# Patient Record
Sex: Female | Born: 1960 | Race: White | Hispanic: No | Marital: Married | Smoking: Never smoker
Health system: Southern US, Community
[De-identification: ages and names within clinical notes are randomized; demographics above are authoritative.]

## PROBLEM LIST (undated history)

## (undated) DIAGNOSIS — D509 Iron deficiency anemia, unspecified: Secondary | ICD-10-CM

## (undated) DIAGNOSIS — M1990 Unspecified osteoarthritis, unspecified site: Secondary | ICD-10-CM

## (undated) DIAGNOSIS — M797 Fibromyalgia: Secondary | ICD-10-CM

## (undated) DIAGNOSIS — F32A Depression, unspecified: Secondary | ICD-10-CM

## (undated) DIAGNOSIS — R7303 Prediabetes: Secondary | ICD-10-CM

## (undated) DIAGNOSIS — T7840XA Allergy, unspecified, initial encounter: Secondary | ICD-10-CM

## (undated) DIAGNOSIS — M21611 Bunion of right foot: Secondary | ICD-10-CM

## (undated) DIAGNOSIS — C439 Malignant melanoma of skin, unspecified: Secondary | ICD-10-CM

## (undated) DIAGNOSIS — I10 Essential (primary) hypertension: Secondary | ICD-10-CM

## (undated) DIAGNOSIS — K5792 Diverticulitis of intestine, part unspecified, without perforation or abscess without bleeding: Secondary | ICD-10-CM

## (undated) DIAGNOSIS — K219 Gastro-esophageal reflux disease without esophagitis: Secondary | ICD-10-CM

## (undated) DIAGNOSIS — E039 Hypothyroidism, unspecified: Secondary | ICD-10-CM

## (undated) DIAGNOSIS — D649 Anemia, unspecified: Secondary | ICD-10-CM

## (undated) DIAGNOSIS — Z8719 Personal history of other diseases of the digestive system: Secondary | ICD-10-CM

## (undated) DIAGNOSIS — R011 Cardiac murmur, unspecified: Secondary | ICD-10-CM

## (undated) DIAGNOSIS — F419 Anxiety disorder, unspecified: Secondary | ICD-10-CM

## (undated) DIAGNOSIS — E079 Disorder of thyroid, unspecified: Secondary | ICD-10-CM

## (undated) DIAGNOSIS — E785 Hyperlipidemia, unspecified: Secondary | ICD-10-CM

## (undated) HISTORY — DX: Allergy, unspecified, initial encounter: T78.40XA

## (undated) HISTORY — PX: JOINT REPLACEMENT: SHX530

## (undated) HISTORY — DX: Malignant melanoma of skin, unspecified: C43.9

## (undated) HISTORY — PX: TUBAL LIGATION: SHX77

## (undated) HISTORY — PX: HERNIA REPAIR: SHX51

## (undated) HISTORY — PX: CHOLECYSTECTOMY: SHX55

## (undated) HISTORY — DX: Unspecified osteoarthritis, unspecified site: M19.90

## (undated) HISTORY — DX: Hyperlipidemia, unspecified: E78.5

## (undated) HISTORY — DX: Disorder of thyroid, unspecified: E07.9

## (undated) HISTORY — PX: COLONOSCOPY: SHX174

---

## 1983-06-27 HISTORY — PX: ABDOMINAL HYSTERECTOMY: SHX81

## 2001-02-18 ENCOUNTER — Other Ambulatory Visit: Admission: RE | Admit: 2001-02-18 | Discharge: 2001-02-18 | Payer: Self-pay | Admitting: Gynecology

## 2002-01-27 ENCOUNTER — Other Ambulatory Visit: Admission: RE | Admit: 2002-01-27 | Discharge: 2002-01-27 | Payer: Self-pay | Admitting: Gynecology

## 2003-08-17 ENCOUNTER — Other Ambulatory Visit: Admission: RE | Admit: 2003-08-17 | Discharge: 2003-08-17 | Payer: Self-pay | Admitting: Gynecology

## 2005-02-10 ENCOUNTER — Other Ambulatory Visit: Admission: RE | Admit: 2005-02-10 | Discharge: 2005-02-10 | Payer: Self-pay | Admitting: Gynecology

## 2006-08-06 ENCOUNTER — Other Ambulatory Visit: Admission: RE | Admit: 2006-08-06 | Discharge: 2006-08-06 | Payer: Self-pay | Admitting: Gynecology

## 2019-05-05 DIAGNOSIS — I10 Essential (primary) hypertension: Secondary | ICD-10-CM | POA: Insufficient documentation

## 2020-01-01 ENCOUNTER — Ambulatory Visit (INDEPENDENT_AMBULATORY_CARE_PROVIDER_SITE_OTHER): Payer: Self-pay | Admitting: Primary Care

## 2020-01-01 ENCOUNTER — Encounter (INDEPENDENT_AMBULATORY_CARE_PROVIDER_SITE_OTHER): Payer: Self-pay | Admitting: Primary Care

## 2020-01-01 ENCOUNTER — Other Ambulatory Visit: Payer: Self-pay

## 2020-01-01 ENCOUNTER — Telehealth (INDEPENDENT_AMBULATORY_CARE_PROVIDER_SITE_OTHER): Payer: Self-pay | Admitting: Primary Care

## 2020-01-01 VITALS — BP 136/90 | HR 92 | Temp 97.8°F | Ht 59.0 in | Wt 167.8 lb

## 2020-01-01 DIAGNOSIS — Z131 Encounter for screening for diabetes mellitus: Secondary | ICD-10-CM

## 2020-01-01 DIAGNOSIS — Z7689 Persons encountering health services in other specified circumstances: Secondary | ICD-10-CM

## 2020-01-01 DIAGNOSIS — M2550 Pain in unspecified joint: Secondary | ICD-10-CM

## 2020-01-01 DIAGNOSIS — F4321 Adjustment disorder with depressed mood: Secondary | ICD-10-CM

## 2020-01-01 DIAGNOSIS — I10 Essential (primary) hypertension: Secondary | ICD-10-CM

## 2020-01-01 LAB — POCT GLYCOSYLATED HEMOGLOBIN (HGB A1C): Hemoglobin A1C: 5.7 % — AB (ref 4.0–5.6)

## 2020-01-01 MED ORDER — LISINOPRIL-HYDROCHLOROTHIAZIDE 10-12.5 MG PO TABS: 1.0000 | ORAL_TABLET | Freq: Every day | ORAL | 1 refills | Status: DC

## 2020-01-01 MED ORDER — CELECOXIB 100 MG PO CAPS
100.0000 mg | ORAL_CAPSULE | Freq: Two times a day (BID) | ORAL | 3 refills | Status: DC
Start: 2020-01-01 — End: 2020-05-04

## 2020-01-01 MED ORDER — ESCITALOPRAM OXALATE 10 MG PO TABS
10.0000 mg | ORAL_TABLET | Freq: Every day | ORAL | 0 refills | Status: DC
Start: 2020-01-01 — End: 2020-03-02

## 2020-01-01 NOTE — Patient Instructions (Signed)
Managing Loss, Adult People experience loss in many different ways throughout their lives. Events such as moving, changing jobs, and losing friends can create a sense of loss. The loss may be as serious as a major health change, divorce, death of a pet, or death of a loved one. All of these types of loss are likely to create a physical and emotional reaction known as grief. Grief is the result of a major change or an absence of something or someone that you count on. Grief is a normal reaction to loss. A variety of factors can affect your grieving experience, including:  The nature of your loss.  Your relationship to what or whom you lost.  Your understanding of grief and how to manage it.  Your support system. How to manage lifestyle changes Keep to your normal routine as much as possible.  If you have trouble focusing or doing normal activities, it is acceptable to take some time away from your normal routine.  Spend time with friends and loved ones.  Eat a healthy diet, get plenty of sleep, and rest when you feel tired. How to recognize changes  The way that you deal with your grief will affect your ability to function as you normally do. When grieving, you may experience these changes:  Numbness, shock, sadness, anxiety, anger, denial, and guilt.  Thoughts about death.  Unexpected crying.  A physical sensation of emptiness in your stomach.  Problems sleeping and eating.  Tiredness (fatigue).  Loss of interest in normal activities.  Dreaming about or imagining seeing the person who died.  A need to remember what or whom you lost.  Difficulty thinking about anything other than your loss for a period of time.  Relief. If you have been expecting the loss for a while, you may feel a sense of relief when it happens. Follow these instructions at home:  Activity Express your feelings in healthy ways, such as:  Talking with others about your loss. It may be helpful to find  others who have had a similar loss, such as a support group.  Writing down your feelings in a journal.  Doing physical activities to release stress and emotional energy.  Doing creative activities like painting, sculpting, or playing or listening to music.  Practicing resilience. This is the ability to recover and adjust after facing challenges. Reading some resources that encourage resilience may help you to learn ways to practice those behaviors. General instructions  Be patient with yourself and others. Allow the grieving process to happen, and remember that grieving takes time. ? It is likely that you may never feel completely done with some grief. You may find a way to move on while still cherishing memories and feelings about your loss. ? Accepting your loss is a process. It can take months or longer to adjust.  Keep all follow-up visits as told by your health care provider. This is important. Where to find support To get support for managing loss:  Ask your health care provider for help and recommendations, such as grief counseling or therapy.  Think about joining a support group for people who are managing a loss. Where to find more information You can find more information about managing loss from:  American Society of Clinical Oncology: www.cancer.net  American Psychological Association: www.apa.org Contact a health care provider if:  Your grief is extreme and keeps getting worse.  You have ongoing grief that does not improve.  Your body shows symptoms of grief, such   as illness.  You feel depressed, anxious, or lonely. Get help right away if:  You have thoughts about hurting yourself or others. If you ever feel like you may hurt yourself or others, or have thoughts about taking your own life, get help right away. You can go to your nearest emergency department or call:  Your local emergency services (911 in the [place]  A suicide crisis helpline, such as the  National Suicide Prevention Lifeline at 1-800-273-8255. This is open 24 hours a day. Summary  Grief is the result of a major change or an absence of someone or something that you count on. Grief is a normal reaction to loss.  The depth of grief and the period of recovery depend on the type of loss and your ability to adjust to the change and process your feelings.  Processing grief requires patience and a willingness to accept your feelings and talk about your loss with people who are supportive.  It is important to find resources that work for you and to realize that people experience grief differently. There is not one grieving process that works for everyone in the same way.  Be aware that when grief becomes extreme, it can lead to more severe issues like isolation, depression, anxiety, or suicidal thoughts. Talk with your health care provider if you have any of these issues. This information is not intended to replace advice given to you by your health care provider. Make sure you discuss any questions you have with your health care provider. Document Revised: 08/16/2018 Document Reviewed: 10/26/2016 Elsevier Patient Education  2020 Elsevier Inc.  

## 2020-01-01 NOTE — Progress Notes (Signed)
Pt complains of daily constant pain all over- has been told that its possibly fibromyalgia

## 2020-01-01 NOTE — Progress Notes (Signed)
New Patient Office Visit  Subjective:  Patient ID: Denise Morales, female    DOB: Apr 28, 1961  Age: 59 y.o. MRN: [id number]  CC:  Chief Complaint  Patient presents with   New Patient (Initial Visit)    hypertension    Skin Cancer    HPI [name] is a 59 year old female  presents for establishment of care.   History reviewed. No pertinent past medical history.   The histories are not reviewed yet. Please review them in the "History" navigator section and refresh this [place].  Family History  Problem Relation Age of Onset   Diabetes Father    Diabetes Sister    Diabetes Paternal Aunt     Social History   Socioeconomic History   Marital status: Married    Spouse name: Not on file   Number of children: Not on file   Years of education: Not on file   Highest education level: Not on file  Occupational History   Not on file  Tobacco Use   Smoking status: Never Smoker   Smokeless tobacco: Never Used  Substance and Sexual Activity   Alcohol use: Not Currently   Drug use: Never   Sexual activity: Not Currently  Other Topics Concern   Not on file  Social History Narrative   Not on file   Social Determinants of Health   Financial Resource Strain:    Difficulty of Paying Living Expenses:   Food Insecurity:    Worried About Charity fundraiser in the Last Year:    Arboriculturist in the Last Year:   Transportation Needs:    Film/video editor (Medical):    Lack of Transportation (Non-Medical):   Physical Activity:    Days of Exercise per Week:    Minutes of Exercise per Session:   Stress:    Feeling of Stress :   Social Connections:    Frequency of Communication with Friends and Family:    Frequency of Social Gatherings with Friends and Family:    Attends Religious Services:    Active Member of Clubs or Organizations:    Attends Music therapist:    Marital Status:   Intimate Partner Violence:     Fear of Current or Ex-Partner:    Emotionally Abused:    Physically Abused:    Sexually Abused:     ROS Review of Systems  Musculoskeletal: Positive for arthralgias and joint swelling.  Skin:       History of skin cancer  Psychiatric/Behavioral: Positive for sleep disturbance.       Mother passed 2020 she was her care taker  Sleep aid over the counter  All other systems reviewed and are negative.   Objective:   Today's Vitals: BP 136/90 (BP Location: Right Arm, Patient Position: Sitting, Cuff Size: Normal)    Pulse 92    Temp 97.8 F (36.6 C) (Oral)    Ht _0  (1.499 m)    Wt 167 lb 12.8 oz (76.1 kg)    SpO2 94%    BMI 33.89 kg/m   Physical Exam Vitals reviewed.  Constitutional:      Appearance: She is obese.  HENT:     Right Ear: Tympanic membrane normal.     Left Ear: Tympanic membrane normal.     Nose: Nose normal.  Eyes:     Extraocular Movements: Extraocular movements intact.     Pupils: Pupils are equal, round, and reactive to light.  Cardiovascular:     Rate and Rhythm: Normal rate and regular rhythm.  Pulmonary:     Effort: Pulmonary effort is normal.     Breath sounds: Normal breath sounds.  Abdominal:     General: Bowel sounds are normal.  Musculoskeletal:        General: Normal range of motion.     Cervical back: Normal range of motion.  Skin:    General: Skin is warm and dry.  Neurological:     Mental Status: She is alert and oriented to person, place, and time.  Psychiatric:        Behavior: Behavior normal.        Thought Content: Thought content normal.     Assessment & Plan:  Mckenleigh was seen today for new patient (initial visit) and skin cancer.  Diagnoses and all orders for this visit:  Encounter to establish care Juluis Mire, NP-C will be your  (PCP) she is mastered prepared . Able to diagnosed and treatment also  answer health concern as well as continuing care of varied medical conditions, not limited by cause, organ  system, or diagnosis.   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 She cried during her visit and has some aches and pains from transferring her mother.   Office Visit from 01/01/2020 in [facility]  PHQ-9 Total Score 20    Added lexapro '10mg'$  daily   Class 2 severe obesity due to excess calories with serious comorbidity in adult, unspecified BMI ([place]) Obesity is 30-39 indicating an excess in caloric intake or underlining conditions. This may lead to other co-morbidities. HTN. T2D and respiratory complications. Lifestyle modifications of diet and exercise may reduce obesity.  -     Lipid Panel -     TSH + free T4  Essential hypertension Counseled on blood pressure goal of less than 130/80, low-sodium, DASH diet, medication compliance, 150 minutes of moderate intensity exercise per week. Discussed medication compliance, adverse effects. -  Added   lisinopril-hydrochlorothiazide (ZESTORETIC) 10-12.5 MG tablet; Take 1 tablet by mouth daily. -     CMP14+EGFR  Arthralgia, unspecified joint -     Sedimentation Rate  Other orders -     escitalopram (LEXAPRO) 10 MG tablet; Take 1 tablet (10 mg total) by mouth daily. -     celecoxib (CELEBREX) 100 MG capsule; Take 1 capsule (100 mg total) by mouth 2 (two) times daily.    Outpatient Encounter Medications as of 01/01/2020  Medication Sig   lisinopril-hydrochlorothiazide (ZESTORETIC) 10-12.5 MG tablet Take 1 tablet by mouth daily.   traMADol (ULTRAM) 50 MG tablet Take by mouth every 6 (six) hours as needed.   [DISCONTINUED] lisinopril-hydrochlorothiazide (ZESTORETIC) 10-12.5 MG tablet Take 1 tablet by mouth daily.   [DISCONTINUED] naproxen (NAPROSYN) 500 MG tablet Take 500 mg by mouth 2 (two) times daily with a meal.   celecoxib (CELEBREX) 100  MG capsule Take 1 capsule (100 mg total) by mouth 2 (two) times daily.   escitalopram (LEXAPRO) 10 MG tablet Take 1 tablet (10 mg total) by mouth daily.   No facility-administered encounter medications on file as of 01/01/2020.    Follow-up: Return in about 8 weeks (around 02/26/2020) for  in person blood pressure , medication effectiveness , CSW.   Sharyn Lull  Harriette Ohara, NP

## 2020-01-01 NOTE — Telephone Encounter (Signed)
Copied from [place] [id number]. Topic: General - Other >> Jan 01, 2020  3:36 PM Keene Breath wrote: Reason for CRM: Patient called to inform the doctor that the scripts (3) that she sent to [facility] are too expensive, $270.00, which the patient cannot afford.  Patient would like it sent to a more affordable pharmacy.  Please advise and call patient to discuss at [phone]

## 2020-01-02 ENCOUNTER — Telehealth (INDEPENDENT_AMBULATORY_CARE_PROVIDER_SITE_OTHER): Payer: Self-pay

## 2020-01-02 ENCOUNTER — Other Ambulatory Visit (INDEPENDENT_AMBULATORY_CARE_PROVIDER_SITE_OTHER): Payer: Self-pay | Admitting: Primary Care

## 2020-01-02 ENCOUNTER — Telehealth: Payer: Self-pay

## 2020-01-02 DIAGNOSIS — E782 Mixed hyperlipidemia: Secondary | ICD-10-CM

## 2020-01-02 LAB — TSH+FREE T4
Free T4: 1.21 ng/dL (ref 0.82–1.77)
TSH: 3.11 u[IU]/mL (ref 0.450–4.500)

## 2020-01-02 LAB — CBC WITH DIFFERENTIAL/PLATELET
Basophils Absolute: 0.1 10*3/uL (ref 0.0–0.2)
Basos: 1 %
EOS (ABSOLUTE): 0.3 10*3/uL (ref 0.0–0.4)
Eos: 3 %
Hematocrit: 40.5 % (ref 34.0–46.6)
Hemoglobin: 14 g/dL (ref 11.1–15.9)
Immature Grans (Abs): 0 10*3/uL (ref 0.0–0.1)
Immature Granulocytes: 0 %
Lymphocytes Absolute: 2.9 10*3/uL (ref 0.7–3.1)
Lymphs: 32 %
MCH: 29 pg (ref 26.6–33.0)
MCHC: 34.6 g/dL (ref 31.5–35.7)
MCV: 84 fL (ref 79–97)
Monocytes Absolute: 0.5 10*3/uL (ref 0.1–0.9)
Monocytes: 6 %
Neutrophils Absolute: 5.2 10*3/uL (ref 1.4–7.0)
Neutrophils: 58 %
Platelets: 369 10*3/uL (ref 150–450)
RBC: 4.82 x10E6/uL (ref 3.77–5.28)
RDW: 13.2 % (ref 11.7–15.4)
WBC: 9 10*3/uL (ref 3.4–10.8)

## 2020-01-02 LAB — CMP14+EGFR
ALT: 13 IU/L (ref 0–32)
AST: 16 IU/L (ref 0–40)
Albumin/Globulin Ratio: 2 (ref 1.2–2.2)
Albumin: 4.6 g/dL (ref 3.8–4.9)
Alkaline Phosphatase: 99 IU/L (ref 48–121)
BUN/Creatinine Ratio: 21 (ref 12–28)
BUN: 24 mg/dL (ref 8–27)
Bilirubin Total: 0.3 mg/dL (ref 0.0–1.2)
CO2: 24 mmol/L (ref 20–29)
Calcium: 9.4 mg/dL (ref 8.7–10.3)
Chloride: 103 mmol/L (ref 96–106)
Creatinine, Ser: 1.15 mg/dL — ABNORMAL HIGH (ref 0.57–1.00)
GFR calc Af Amer: 60 mL/min/{1.73_m2} (ref >59)
GFR calc non Af Amer: 52 mL/min/{1.73_m2} — ABNORMAL LOW (ref >59)
Globulin, Total: 2.3 g/dL (ref 1.5–4.5)
Glucose: 97 mg/dL (ref 65–99)
Potassium: 3.6 mmol/L (ref 3.5–5.2)
Sodium: 141 mmol/L (ref 134–144)
Total Protein: 6.9 g/dL (ref 6.0–8.5)

## 2020-01-02 LAB — LIPID PANEL
Chol/HDL Ratio: 4.6 ratio — ABNORMAL HIGH (ref 0.0–4.4)
Cholesterol, Total: 231 mg/dL — ABNORMAL HIGH (ref 100–199)
HDL: 50 mg/dL (ref >39)
LDL Chol Calc (NIH): 151 mg/dL — ABNORMAL HIGH (ref 0–99)
Triglycerides: 165 mg/dL — ABNORMAL HIGH (ref 0–149)
VLDL Cholesterol Cal: 30 mg/dL (ref 5–40)

## 2020-01-02 LAB — SEDIMENTATION RATE: Sed Rate: 25 mm/hr (ref 0–40)

## 2020-01-02 MED ORDER — PRAVASTATIN SODIUM 40 MG PO TABS: 40.0000 mg | ORAL_TABLET | Freq: Every day | ORAL | 3 refills | Status: DC

## 2020-01-02 NOTE — Telephone Encounter (Signed)
Pt. Called. Given lab results and instructions. Verbalizes understanding.

## 2020-01-02 NOTE — Telephone Encounter (Signed)
Per notes, patient was able to use [facility] to obtain rxs. No additional f/u needed.

## 2020-01-02 NOTE — Telephone Encounter (Signed)
Patient used Rx card and price decrease tremendously no need for a call back regarding the below.

## 2020-01-02 NOTE — Telephone Encounter (Signed)
Patient verified date of birth. She is aware that Labs are normal except cholesterol panel sent in pravastatin 40mg  nightly. Increase risk for stroke and heart attack  Healthy lifestyle diet of fruits vegetables fish nuts whole grains and low saturated fat . Foods high in cholesterol or liver, fatty meats,cheese, butter avocados, nuts and seeds, chocolate and fried foods. Patient states she does not eat any meat. She verbalized understanding of results. Nat Christen, CMA

## 2020-01-02 NOTE — Telephone Encounter (Signed)
-----   Message from Kerin Perna, NP sent at 01/02/2020 10:39 AM EDT ----- Labs are normal except  cholesterol panel sent in pravastatin 40mg  nightly. Increase risk for stroke and heart attack  Healthy lifestyle diet of fruits vegetables fish nuts whole grains and low saturated fat . Foods high in cholesterol or liver, fatty meats,cheese, butter avocados, nuts and seeds, chocolate and fried foods.

## 2020-01-16 ENCOUNTER — Other Ambulatory Visit: Payer: Self-pay

## 2020-01-16 ENCOUNTER — Ambulatory Visit: Payer: Self-pay | Attending: Family Medicine

## 2020-02-27 ENCOUNTER — Ambulatory Visit: Payer: Self-pay

## 2020-02-27 ENCOUNTER — Other Ambulatory Visit: Payer: Self-pay

## 2020-03-02 ENCOUNTER — Ambulatory Visit (INDEPENDENT_AMBULATORY_CARE_PROVIDER_SITE_OTHER): Payer: Self-pay | Admitting: Licensed Clinical Social Worker

## 2020-03-02 ENCOUNTER — Encounter (INDEPENDENT_AMBULATORY_CARE_PROVIDER_SITE_OTHER): Payer: Self-pay | Admitting: Primary Care

## 2020-03-02 ENCOUNTER — Other Ambulatory Visit: Payer: Self-pay

## 2020-03-02 ENCOUNTER — Ambulatory Visit (INDEPENDENT_AMBULATORY_CARE_PROVIDER_SITE_OTHER): Payer: Self-pay | Admitting: Primary Care

## 2020-03-02 DIAGNOSIS — F4323 Adjustment disorder with mixed anxiety and depressed mood: Secondary | ICD-10-CM

## 2020-03-02 DIAGNOSIS — I10 Essential (primary) hypertension: Secondary | ICD-10-CM

## 2020-03-02 MED ORDER — ESCITALOPRAM OXALATE 10 MG PO TABS: 10.0000 mg | ORAL_TABLET | Freq: Every day | ORAL | 1 refills | Status: DC

## 2020-03-02 NOTE — BH Specialist Note (Signed)
Call placed to patient regarding [facility] referral. LCSW introduced self and explained role at [facility]. Pt was informed that referral was placed to follow up on anxiety and depression.   Pt shared that her mother passed away Sep 11, 2018 noting pt was her primary caregiver for approximately 5 years. She reports decrease in anxiety and depression symptoms with compliance to medication management. She shared that she has improved ability to handle stress "a lot better" and is currently working with Development worker, community on obtaining financial assistance. .   Pt was successful in identifying healthy coping skills (taking meds, gardening, and spending time with sisters, daughter, and 2 grandchildren) Pt is prioritizing self-care.  Currently, pt is not in need of any additional behavioral health and/or resource needs. LCSW provided patient with contact information and strongly encouraged her to contact clinic, if needed.

## 2020-03-02 NOTE — Progress Notes (Signed)
Telephone Note  I connected with Denise Morales on 03/02/20 at  9:50 AM EDT by telephone and verified that I am speaking with the correct person using two identifiers.  Patient is at home. I discussed the limitations, risks, security and privacy concerns of performing an evaluation and management service by telephone and the availability of in person appointments. I also discussed with the patient that there may be a patient responsible charge related to this service. The patient expressed understanding and agreed to proceed. Juluis Mire, Np   History of Present Illness: [name] is a 59 year old female that went outside on her deck and slipped and fell- she has some scratches, bruises but no open area. She has some soreness and stiffness on her right side especially  her lower back. Initial was blood pressure follow which has been great systolic 974-163 and diastolic 84-53.  History reviewed. No pertinent past medical history.  Current Outpatient Medications on File Prior to Visit  Medication Sig Dispense Refill  . celecoxib (CELEBREX) 100 MG capsule Take 1 capsule (100 mg total) by mouth 2 (two) times daily. 60 capsule 3  . lisinopril-hydrochlorothiazide (ZESTORETIC) 10-12.5 MG tablet Take 1 tablet by mouth daily. 90 tablet 1  . pravastatin (PRAVACHOL) 40 MG tablet Take 1 tablet (40 mg total) by mouth daily. 90 tablet 3  . traMADol (ULTRAM) 50 MG tablet Take by mouth every 6 (six) hours as needed.     No current facility-administered medications on file prior to visit.   Observations/Objective: Review of Systems  All other systems reviewed and are negative.   Assessment and Plan: Denise Morales was seen today for hypertension and medication effectiveness.  Diagnoses and all orders for this visit:  Essential hypertension Counseled on blood pressure goal of less than 130/80, low-sodium, DASH diet, medication compliance, 150 minutes of moderate intensity exercise per  week. Discussed medication compliance, adverse effects.  Adjustment disorder with mixed anxiety and depressed mood  We discussed options for treatment of anxiety including therapy and/or medication.  Will check basic labs to ensure thyroid is in normal range and that no other metabolic issues are obvious.  Reviewed concept of anxiety as biochemical imbalance of neurotransmitters and rationale for treatment. Discussed potential risks, expected benefits, possible side effects of the medicine. We also discussed how to take it correctly and dosing instructions. If she has any significant side effects to the medicine, she is to stop it and call for advice.  Instructed patient to contact office or on-call physician promptly should condition worsen or any new symptoms appear.    She was agreeable with this plan.   Spent 25 minutes (>50% of visit) discussing the risks of anxiety disorder, the pathophysiology, etiology, risks, and principles of treatment.  -     escitalopram (LEXAPRO) 10 MG tablet; Take 1 tablet (10 mg total) by mouth daily.    Follow Up Instructions:    I discussed the assessment and treatment plan with the patient. The patient was provided an opportunity to ask questions and all were answered. The patient agreed with the plan and demonstrated an understanding of the instructions.   The patient was advised to call back or seek an in-person evaluation if the symptoms worsen or if the condition fails to improve as anticipated.  I provided 15 minutes of non-face-to-face time during this encounter.   Kerin Perna, NP

## 2020-03-02 NOTE — Progress Notes (Signed)
Pt would like to know if naproxen can be refilled  7/29 136/81 @1 :30 pm this is the highest reading  All other pressures have ranged from 979 systolic and 82 diastolic  Bp today at 53:69 117/76 Pt fell yesterday and has some back pain and right shoulder pain

## 2020-03-16 ENCOUNTER — Other Ambulatory Visit: Payer: Medicaid Other

## 2020-03-16 DIAGNOSIS — Z20822 Contact with and (suspected) exposure to covid-19: Secondary | ICD-10-CM | POA: Diagnosis not present

## 2020-03-18 LAB — SARS-COV-2, NAA 2 DAY TAT

## 2020-03-18 LAB — NOVEL CORONAVIRUS, NAA: SARS-CoV-2, NAA: NOT DETECTED

## 2020-03-30 ENCOUNTER — Ambulatory Visit (INDEPENDENT_AMBULATORY_CARE_PROVIDER_SITE_OTHER): Payer: Self-pay | Admitting: Primary Care

## 2020-03-30 ENCOUNTER — Other Ambulatory Visit: Payer: Self-pay

## 2020-03-30 ENCOUNTER — Encounter (INDEPENDENT_AMBULATORY_CARE_PROVIDER_SITE_OTHER): Payer: Self-pay | Admitting: Primary Care

## 2020-03-30 VITALS — BP 138/84 | HR 95 | Temp 97.3°F | Ht 59.0 in | Wt 166.6 lb

## 2020-03-30 DIAGNOSIS — M25512 Pain in left shoulder: Secondary | ICD-10-CM

## 2020-03-30 DIAGNOSIS — G8929 Other chronic pain: Secondary | ICD-10-CM

## 2020-03-30 DIAGNOSIS — E782 Mixed hyperlipidemia: Secondary | ICD-10-CM

## 2020-03-30 DIAGNOSIS — I10 Essential (primary) hypertension: Secondary | ICD-10-CM

## 2020-03-30 DIAGNOSIS — M25562 Pain in left knee: Secondary | ICD-10-CM

## 2020-03-30 DIAGNOSIS — M545 Low back pain, unspecified: Secondary | ICD-10-CM

## 2020-03-30 DIAGNOSIS — M25561 Pain in right knee: Secondary | ICD-10-CM

## 2020-03-30 DIAGNOSIS — M2550 Pain in unspecified joint: Secondary | ICD-10-CM

## 2020-03-30 DIAGNOSIS — M25511 Pain in right shoulder: Secondary | ICD-10-CM

## 2020-03-30 NOTE — Progress Notes (Signed)
Pt complains of body pain all over

## 2020-03-30 NOTE — Patient Instructions (Signed)

## 2020-03-30 NOTE — Progress Notes (Signed)
Established Patient Office Visit  Subjective:  Patient ID: Denise Morales, female    DOB: 08/25/1960  Age: 59 y.o. MRN: [id number]  CC:  Chief Complaint  Patient presents with  . body pain    HPI Denise Morales is a 59 year old female who presents for lateral shoulder pain which is radiating to her elbow. Her body aches and is in pain Daily. 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  Left, bending, transferring, bathing, putting her in the wheelchair all her ADLs was done by patient. She cared for her mother and father for thge last 63 years. Now her body is feeling the repercussions   No past medical history on file.  Past Surgical History:  Procedure Laterality Date  . ABDOMINAL HYSTERECTOMY  1985    Family History  Problem Relation Age of Onset  . Diabetes Father   . Diabetes Sister   . Diabetes Paternal Aunt     Social History   Socioeconomic History  . Marital status: Single    Spouse name: Not on file  . Number of children: Not on file  . Years of education: Not on file  . Highest education level: Not on file  Occupational History  . Not on file  Tobacco Use  . Smoking status: Never Smoker  . Smokeless tobacco: Never Used  Substance and Sexual Activity  . Alcohol use: Not Currently  . Drug use: Never  . Sexual activity: Not Currently  Other Topics Concern  . Not on file  Social History Narrative  . Not on file   Social Determinants of Health   Financial Resource Strain:   . Difficulty of Paying Living Expenses: Not on file  Food Insecurity:   . Worried About Charity fundraiser in the Last Year: Not on file  . Ran Out of Food in the Last Year: Not on file  Transportation Needs:   . Lack of Transportation (Medical): Not on file  . Lack of Transportation (Non-Medical): Not on file  Physical Activity:   . Days of Exercise per Week: Not on file  . Minutes of Exercise per Session: Not on file  Stress:   . Feeling of Stress :  Not on file  Social Connections:   . Frequency of Communication with Friends and Family: Not on file  . Frequency of Social Gatherings with Friends and Family: Not on file  . Attends Religious Services: Not on file  . Active Member of Clubs or Organizations: Not on file  . Attends Archivist Meetings: Not on file  . Marital Status: Not on file  Intimate Partner Violence:   . Fear of Current or Ex-Partner: Not on file  . Emotionally Abused: Not on file  . Physically Abused: Not on file  . Sexually Abused: Not on file    Outpatient Medications Prior to Visit  Medication Sig Dispense Refill  . celecoxib (CELEBREX) 100 MG capsule Take 1 capsule (100 mg total) by mouth 2 (two) times daily. 60 capsule 3  . escitalopram (LEXAPRO) 10 MG tablet Take 1 tablet (10 mg total) by mouth daily. 90 tablet 1  . lisinopril-hydrochlorothiazide (ZESTORETIC) 10-12.5 MG tablet Take 1 tablet by mouth daily. 90 tablet 1  . traMADol (ULTRAM) 50 MG tablet Take by mouth every 6 (six) hours as needed.    . pravastatin (PRAVACHOL) 40 MG tablet Take 1 tablet (40 mg total) by mouth daily. 90 tablet 3   No facility-administered medications prior  to visit.    No Known Allergies  ROS Review of Systems  Constitutional: Positive for fatigue.  Gastrointestinal: Negative for abdominal pain.  Musculoskeletal: Positive for arthralgias, back pain, gait problem and neck pain.       Shoulder pain and bilateral knee pain  Psychiatric/Behavioral: Positive for sleep disturbance.       Due to unable to get comfortable in the bed to rest      Objective:    Physical Exam Vitals reviewed.  Constitutional:      Appearance: She is obese.  HENT:     Head: Normocephalic.     Nose: Nose normal.  Eyes:     Extraocular Movements: Extraocular movements intact.  Cardiovascular:     Rate and Rhythm: Normal rate and regular rhythm.  Pulmonary:     Effort: Pulmonary effort is normal.     Breath sounds: Normal  breath sounds.  Abdominal:     General: Bowel sounds are normal. There is distension.  Musculoskeletal:        General: Normal range of motion.     Cervical back: Normal range of motion and neck supple.  Skin:    General: Skin is warm.  Neurological:     Mental Status: She is alert and oriented to person, place, and time.  Psychiatric:        Mood and Affect: Mood normal.        Behavior: Behavior normal.        Thought Content: Thought content normal.     BP 138/84 (BP Location: Right Arm, Patient Position: Sitting, Cuff Size: Large)   Pulse 95   Temp (!) 97.3 F (36.3 C) (Temporal)   Ht 4\' 11"  (1.499 m)   Wt 166 lb 9.6 oz (75.6 kg)   SpO2 94%   BMI 33.65 kg/m  Wt Readings from Last 3 Encounters:  03/30/20 166 lb 9.6 oz (75.6 kg)  01/01/20 167 lb 12.8 oz (76.1 kg)     Health Maintenance Due  Topic Date Due  . Hepatitis C Screening  Never done  . HIV Screening  Never done  . MAMMOGRAM  Never done  . COLONOSCOPY  Never done    There are no preventive care reminders to display for this patient.  Lab Results  Component Value Date   TSH 3.110 01/01/2020   Lab Results  Component Value Date   WBC 9.0 01/01/2020   HGB 14.0 01/01/2020   HCT 40.5 01/01/2020   MCV 84 01/01/2020   PLT 369 01/01/2020   Lab Results  Component Value Date   NA 141 01/01/2020   K 3.6 01/01/2020   CO2 24 01/01/2020   GLUCOSE 97 01/01/2020   BUN 24 01/01/2020   CREATININE 1.15 (H) 01/01/2020   BILITOT 0.3 01/01/2020   ALKPHOS 99 01/01/2020   AST 16 01/01/2020   ALT 13 01/01/2020   PROT 6.9 01/01/2020   ALBUMIN 4.6 01/01/2020   CALCIUM 9.4 01/01/2020   Lab Results  Component Value Date   CHOL 157 03/30/2020   Lab Results  Component Value Date   HDL 47 03/30/2020   Lab Results  Component Value Date   LDLCALC 77 03/30/2020   Lab Results  Component Value Date   TRIG 194 (H) 03/30/2020   Lab Results  Component Value Date   CHOLHDL 3.3 03/30/2020   Lab Results   Component Value Date   HGBA1C 5.7 (A) 01/01/2020      Assessment & Plan:  Denise Morales was  seen today for body pain.  Diagnoses and all orders for this visit:  Essential hypertension Counseled on blood pressure goal of less than 130/80, low-sodium, DASH diet, medication compliance, 150 minutes of moderate intensity exercise per week. Discussed medication compliance, adverse effects. Continue lisinopril/HCTZ 10/12.5 daily  Arthralgia, unspecified joint -     Ambulatory referral to Orthopedic Surgery  Pain of both shoulder joints -     Ambulatory referral to Orthopedic Surgery  Chronic pain of both knees -     Ambulatory referral to Orthopedic Surgery  Chronic bilateral low back pain without sciatica BACK PAIN  Location: lumbar/sacral Quality: aching, heaviness, pressure, uncomfortable and with paresthesia Onset: unchanged Worse with: standing and bending      Better with:heating pain  Radiation: down hips and back of leg Trauma: no Best sitting/standing/leaning forward: yes Red Flags Fecal/urinary incontinence: no  Numbness/Weakness: yes  Fever/chills/sweats: no  Night pain: yes  Unexplained weight loss: no  No relief with bedrest: no  h/o cancer/immunosuppression: no  IV drug use: no  PMH of osteoporosis or chronic steroid use: yes  -     Ambulatory referral to Orthopedic Surgery   No orders of the defined types were placed in this encounter.   Follow-up: No follow-ups on file.    Kerin Perna, NP

## 2020-03-31 ENCOUNTER — Other Ambulatory Visit (INDEPENDENT_AMBULATORY_CARE_PROVIDER_SITE_OTHER): Payer: Self-pay | Admitting: Primary Care

## 2020-03-31 DIAGNOSIS — E782 Mixed hyperlipidemia: Secondary | ICD-10-CM

## 2020-03-31 LAB — LIPID PANEL
Chol/HDL Ratio: 3.3 ratio (ref 0.0–4.4)
Cholesterol, Total: 157 mg/dL (ref 100–199)
HDL: 47 mg/dL (ref >39)
LDL Chol Calc (NIH): 77 mg/dL (ref 0–99)
Triglycerides: 194 mg/dL — ABNORMAL HIGH (ref 0–149)
VLDL Cholesterol Cal: 33 mg/dL (ref 5–40)

## 2020-03-31 MED ORDER — PRAVASTATIN SODIUM 40 MG PO TABS: 40.0000 mg | ORAL_TABLET | Freq: Every day | ORAL | 3 refills | Status: DC

## 2020-04-05 ENCOUNTER — Encounter: Payer: Self-pay | Admitting: Family Medicine

## 2020-04-05 ENCOUNTER — Ambulatory Visit (INDEPENDENT_AMBULATORY_CARE_PROVIDER_SITE_OTHER): Payer: Medicaid Other | Admitting: Family Medicine

## 2020-04-05 ENCOUNTER — Other Ambulatory Visit: Payer: Self-pay

## 2020-04-05 DIAGNOSIS — M25511 Pain in right shoulder: Secondary | ICD-10-CM

## 2020-04-05 DIAGNOSIS — M25512 Pain in left shoulder: Secondary | ICD-10-CM

## 2020-04-05 DIAGNOSIS — M545 Low back pain, unspecified: Secondary | ICD-10-CM

## 2020-04-05 DIAGNOSIS — M25562 Pain in left knee: Secondary | ICD-10-CM

## 2020-04-05 DIAGNOSIS — G8929 Other chronic pain: Secondary | ICD-10-CM

## 2020-04-05 MED ORDER — VITAMIN K2 100 MCG PO TABS: 100.0000 ug | ORAL_TABLET | Freq: Every day | ORAL | 3 refills | Status: DC

## 2020-04-05 MED ORDER — BACLOFEN 10 MG PO TABS: 5.0000 mg | ORAL_TABLET | Freq: Three times a day (TID) | ORAL | 3 refills | Status: DC | PRN

## 2020-04-05 MED ORDER — VITAMIN D-3 125 MCG (5000 UT) PO TABS: 1.0000 | ORAL_TABLET | Freq: Every day | ORAL | 3 refills | Status: DC

## 2020-04-05 MED ORDER — NABUMETONE 500 MG PO TABS
500.0000 mg | ORAL_TABLET | Freq: Two times a day (BID) | ORAL | 3 refills | Status: DC | PRN
Start: 2020-04-05 — End: 2020-07-20

## 2020-04-05 NOTE — Progress Notes (Signed)
Office Visit Note   Patient: Denise Morales           Date of Birth: [birth year]           MRN: [id number] Visit Date: 04/05/2020 Requested by: Kerin Perna, NP [address] [place],  [place] [address] PCP: Kerin Perna, NP  Subjective: Chief Complaint  Patient presents with  . Lower Back - Pain  . Left Knee - Pain  . Left Shoulder - Pain  . Right Shoulder - Pain    HPI: She is here with multiple areas of pain.  She has bilateral shoulder pain, low back pain, and left knee pain.  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  Her mother, her father, and her sister have now all passed away.  The most difficult was caring for her mother who had a stroke and multiple falls.  This required a lot of lifting for the patient.  There was never 1 moment where she knew that she had hurt herself, but over time she started noticing pain in all of the above areas.  Now her pain is constant and is significantly affecting her quality of life.  She has been taking tramadol along with Celebrex with minimal improvement in symptoms.  Shoulders hurt with overhead and behind the back reach.  Low back hurts in the midline and occasionally radiates down the back of the thighs.  The left knee hurts on the medial aspect and occasionally in the posterior aspect.  Many months ago she was given a steroid Dosepak which gave her some improvement in symptoms.  She denies any history of diabetes.  She does have hyperlipidemia and hypertension which are controlled with medication.                ROS: No fevers or chills.  All other systems were reviewed and are negative.  Objective: Vital Signs: There were no vitals taken for this visit.  Physical Exam:  General:  Alert and oriented, in no acute distress. Pulm:  Breathing unlabored. Psy:  Normal mood, congruent affect. Skin: No rash Shoulders: She has early adhesive capsulitis on the left.  Full range of motion on the right  but pain at the extremes.  Isometric rotator cuff strength is 5/5 throughout.  There is tenderness over the [facility] joint of both shoulders. Low back: Very tender in the midline over the L5-S1 level.  There is some pain in the sciatic notch on the right.  Good range of motion and no significant pain with passive internal hip rotation bilaterally.  Straight leg raise negative, lower extremity strength and reflexes are normal. Left knee: Trace effusion with no warmth.  Moderately tender on the medial joint line, no palpable click with McMurray's.  Full active extension, flexion of 120 degrees.   Imaging: None today  Assessment & Plan: 1.  Chronic bilateral shoulder pain with early adhesive capsulitis, probable rotator cuff tendinopathy. -We will try physical therapy.  Relafen and baclofen as needed.  If symptoms persist, we will obtain x-rays and possibly consider subacromial injection.  2.  Chronic low back pain -Physical therapy and medications as above.  3.  Left knee pain, suspect osteoarthritis. -Medications as above.  Vitamin D3, K2 for bone health.      Procedures: No procedures performed  No notes on file     PMFS History: There are no problems to display for this patient.  History reviewed. No pertinent past medical history.  Family History  Problem Relation Age of Onset  . Diabetes Father   . Diabetes Sister   . Diabetes Paternal Aunt     Past Surgical History:  Procedure Laterality Date  . ABDOMINAL HYSTERECTOMY  1985   Social History   Occupational History  . Not on file  Tobacco Use  . Smoking status: Never Smoker  . Smokeless tobacco: Never Used  Substance and Sexual Activity  . Alcohol use: Not Currently  . Drug use: Never  . Sexual activity: Not Currently

## 2020-04-05 NOTE — Progress Notes (Signed)
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

## 2020-04-23 ENCOUNTER — Other Ambulatory Visit: Payer: Self-pay

## 2020-04-23 ENCOUNTER — Ambulatory Visit: Payer: Medicaid Other | Attending: Family Medicine

## 2020-04-23 DIAGNOSIS — M25612 Stiffness of left shoulder, not elsewhere classified: Secondary | ICD-10-CM

## 2020-04-23 DIAGNOSIS — R293 Abnormal posture: Secondary | ICD-10-CM

## 2020-04-23 DIAGNOSIS — M25511 Pain in right shoulder: Secondary | ICD-10-CM | POA: Insufficient documentation

## 2020-04-23 DIAGNOSIS — M25611 Stiffness of right shoulder, not elsewhere classified: Secondary | ICD-10-CM | POA: Insufficient documentation

## 2020-04-23 DIAGNOSIS — G8929 Other chronic pain: Secondary | ICD-10-CM | POA: Insufficient documentation

## 2020-04-23 DIAGNOSIS — M25512 Pain in left shoulder: Secondary | ICD-10-CM | POA: Insufficient documentation

## 2020-04-24 NOTE — Therapy (Signed)
[place], [place], [address] Phone: [phone]   Fax:  [phone]  Physical Therapy Evaluation  Patient Details  Name: Denise Morales MRN: [id number] Date of Birth: [birth year] No data recorded  Encounter Date: 04/23/2020   PT End of Session - 04/24/20 0954    Visit Number 1    Number of Visits 17    Date for PT Re-Evaluation 06/26/20    Authorization Type MEDICAID [place]-[employer]    Authorization - Visit Number 0    Authorization - Number of Visits 3    Progress Note Due on Visit 10    PT Start Time 1050    PT Stop Time 1136    PT Time Calculation (min) 46 min    Activity Tolerance Patient tolerated treatment well    Behavior During Therapy [facility] for tasks assessed/performed           History reviewed. No pertinent past medical history.  Past Surgical History:  Procedure Laterality Date   ABDOMINAL HYSTERECTOMY  1985    There were no vitals filed for this visit.    Subjective Assessment - 04/24/20 1028    Subjective Pt reports an extended Hx of chronic B sh, low back and knee pain. 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    Limitations Lifting;Standing;Walking;House hold activities    Currently in Pain? Yes    Pain Score 5    L 3; R pain range 5-10/10   Pain Location Shoulder    Pain Orientation Right;Lateral;Left    Pain Descriptors / Indicators Aching;Throbbing;Sharp;Other (Comment)   popping   Pain Type Chronic pain    Pain Onset More than a month ago    Pain Frequency Constant    Aggravating Factors  Use or arm and certain movements cause pain and popping    Pain Relieving Factors Limits movement , rest    Effect of Pain on Daily Activities Significant impact              [facility] PT Assessment - 04/24/20 0001      Assessment   Medical Diagnosis Chronic R and L shoulder pain;  Chronic bilateral low back pain s sciatica;  Chronic pain of L knee    Hand Dominance Right      Precautions   Precautions None      Restrictions   Weight Bearing Restrictions No      Balance Screen   Has the patient fallen in the past 6 months Yes    How many times? 1    Has the patient had a decrease in activity level because of a fear of falling?  No    Is the patient reluctant to leave their home because of a fear of falling?  No      Home Environment   Living Environment Private residence    Living Arrangements Children    Type of [place] to enter    Entrance Stairs-Number of Steps 5    Entrance Stairs-Rails Left    [place] One level    [place] - single point   sometimes uses     Prior Function   Level of Independence Independent      Cognition   Overall Cognitive Status Within Functional Limits for tasks assessed  Observation/Other Assessments   Focus on Therapeutic Outcomes (FOTO)  32% fxal ability      Sensation   Light Touch Appears Intact      Posture/Postural Control   Posture/Postural Control Postural limitations    Postural Limitations Rounded Shoulders;Forward head   CTstep off     ROM / Strength   AROM / PROM / Strength AROM;Strength      AROM   Overall AROM Comments B shoulders demonstrate 100d of active flexion c pain R>L, Passive motion of 125d is then able to be achieved.       Strength   Overall Strength Comments B shoulders demonstrate 4+ to 5/5 strength c min provocation of pain R c ER.      Palpation   Palpation comment TTP to lateral [place] jt line and upper traps      Special Tests    Special Tests Rotator Cuff Impingement    Rotator Cuff Impingment tests Michel Bickers test;Empty Can test;Full Can test      Hawkins-Kennedy test   Findings Positive    Side Right      Empty Can test   Findings Negative    Side Right   Lt   Comment min sh pain      Full Can test   Findings Negative     Side Right   Lt   Comment min sh pain      Transfers   Transfers Sit to Stand;Stand to Sit    Sit to Stand 6: Modified independent (Device/Increase time)   increased time     Ambulation/Gait   Ambulation/Gait Yes    Ambulation/Gait Assistance 6: Modified independent (Device/Increase time)   increased time   Gait Pattern Antalgic                      Objective measurements completed on examination: See above findings.               PT Education - 04/24/20 1003    Education Details Eval findings, POC, HEP, relationship between poor posture and shoulder pain    Person(s) Educated Patient    Methods Explanation;Demonstration;Tactile cues;Verbal cues;Handout    Comprehension Verbalized understanding;Returned demonstration;Verbal cues required;Tactile cues required;Need further instruction            PT Short Term Goals - 04/24/20 1046      PT SHORT TERM GOAL #1   Title Pt will be Ind in an initial HEP.    Status New    Target Date 05/15/20      PT SHORT TERM GOAL #2   Title Pt will voice understanding of measure to reduce and manage her pain.    Target Date 05/15/20             PT Long Term Goals - 04/24/20 1048      PT LONG TERM GOAL #1   Title Increase B shoulder flexion ROM to 120d to promote functional use of UEs    Baseline 100d    Target Date 06/26/20      PT LONG TERM GOAL #2   Title Pt will report a decrease in B shoulder pain to a range of 2-7/10 with daily activities for improved functional use    Baseline 5-10/10    Status New    Target Date 06/26/20      PT LONG TERM GOAL #3   Title Pt will report tolerating moderate activity in her home ie vacuuming and carrying  groceries with moderate difficulty    Baseline marked difficulty    Status New    Target Date 06/26/20      PT LONG TERM GOAL #4   Title Pt's FOTO score for fxal ability will improve to 43% demonstrating improved fxal ability    Baseline 32%    Status New     Target Date 06/26/20      PT LONG TERM GOAL #5   Title Pt will be Ind in a final HEP to maintain and progress achieved LOF    Status New    Target Date 06/26/20                  Plan - 04/24/20 0955    Clinical Impression Statement Pt presents with bilat sh pain R greater than L. Pt requested PT be provided for her shoulders first. Signs and symptoms are consistent with impingement/tendinopathy with pain being more an issue than strength. Pt was started on an HEP and iontophoresis was completed for the R sh. Pt will benefit from further PT to her shoulders and assessment of her low back and L knee. PT is recommended for 2w8.    Personal Factors and Comorbidities Age;Time since onset of injury/illness/exacerbation;Past/Current Experience    Examination-Activity Limitations Locomotion Level;Transfers;Reach Overhead;Sleep;Stairs;Stand;Lift    Stability/Clinical Decision Making Evolving/Moderate complexity    Clinical Decision Making Moderate    Rehab Potential Fair    PT Frequency 2x / week    PT Duration 8 weeks    PT Treatment/Interventions ADLs/Self Care Home Management;Cryotherapy;Electrical Stimulation;Iontophoresis 4mg /ml Dexamethasone;Moist Heat;Traction;Ultrasound;Therapeutic exercise;Therapeutic activities;Functional mobility training;Stair training;Gait training;Patient/family education;Energy conservation;Dry needling;Passive range of motion;Taping;Vasopneumatic Device;Spinal Manipulations;Joint Manipulations    PT Next Visit Plan Assess respnse to HEP; Assess low back and L knee as tolerated, progress HEP and utilize modalities as indicated.    PT Home Exercise Plan [id number]    Consulted and Agree with Plan of Care Patient           Patient will benefit from skilled therapeutic intervention in order to improve the following deficits and impairments:  Abnormal gait, Decreased range of motion, Difficulty walking, Impaired UE functional use, Obesity, Decreased activity  tolerance, Pain, Improper body mechanics, Postural dysfunction, Decreased strength, Decreased mobility  Visit Diagnosis: Chronic pain in right shoulder - Plan: PT plan of care cert/re-cert  Chronic left shoulder pain - Plan: PT plan of care cert/re-cert  Decreased ROM of left shoulder - Plan: PT plan of care cert/re-cert  Decreased ROM of right shoulder - Plan: PT plan of care cert/re-cert  Abnormal posture - Plan: PT plan of care cert/re-cert     Problem List There are no problems to display for this patient.  Gar Ponto MS, PT 04/24/20 11:06 AM   [facility] [address] [place], [place], [address] Phone: [phone]   Fax:  [phone]  Name: Hadyn Azer MRN: [id number] Date of Birth: 09-06-1960

## 2020-04-26 ENCOUNTER — Other Ambulatory Visit (INDEPENDENT_AMBULATORY_CARE_PROVIDER_SITE_OTHER): Payer: Self-pay | Admitting: Primary Care

## 2020-04-26 NOTE — Telephone Encounter (Signed)
Sent to PCP ?

## 2020-04-28 ENCOUNTER — Other Ambulatory Visit (INDEPENDENT_AMBULATORY_CARE_PROVIDER_SITE_OTHER): Payer: Self-pay | Admitting: Primary Care

## 2020-04-28 ENCOUNTER — Telehealth: Payer: Self-pay | Admitting: Primary Care

## 2020-04-28 NOTE — Telephone Encounter (Signed)
Please contact patient to explain why celebrex was denied.

## 2020-04-28 NOTE — Telephone Encounter (Signed)
Copied from [place] [id number]. Topic: General - Other >> Apr 28, 2020 12:32 PM Rainey Pines A wrote: Pt would like a callback from nurse in regards to her medication request for celebrex being denied. Please advise

## 2020-04-29 NOTE — Telephone Encounter (Signed)
Ortho Dr. Junius [place]s is managing pain

## 2020-04-29 NOTE — Telephone Encounter (Signed)
Pt called regarding this message. Please advise.

## 2020-04-29 NOTE — Telephone Encounter (Signed)
Please explain to patient why it was denied.

## 2020-04-30 ENCOUNTER — Encounter (INDEPENDENT_AMBULATORY_CARE_PROVIDER_SITE_OTHER): Payer: Self-pay | Admitting: Primary Care

## 2020-04-30 NOTE — Telephone Encounter (Signed)
Pt would like a call back, has questions concerning this medication and how it interferes with other Rxs. Understands why it is not being refilled but she just wants to speak to someone clinical.

## 2020-05-04 ENCOUNTER — Other Ambulatory Visit (INDEPENDENT_AMBULATORY_CARE_PROVIDER_SITE_OTHER): Payer: Self-pay | Admitting: Primary Care

## 2020-05-04 DIAGNOSIS — M2550 Pain in unspecified joint: Secondary | ICD-10-CM

## 2020-05-04 MED ORDER — CELECOXIB 100 MG PO CAPS: 100.0000 mg | ORAL_CAPSULE | Freq: Two times a day (BID) | ORAL | 3 refills | Status: DC

## 2020-05-07 ENCOUNTER — Ambulatory Visit: Payer: Self-pay | Attending: Family Medicine

## 2020-05-07 ENCOUNTER — Other Ambulatory Visit: Payer: Self-pay

## 2020-05-07 DIAGNOSIS — M25611 Stiffness of right shoulder, not elsewhere classified: Secondary | ICD-10-CM | POA: Insufficient documentation

## 2020-05-07 DIAGNOSIS — M25512 Pain in left shoulder: Secondary | ICD-10-CM | POA: Insufficient documentation

## 2020-05-07 DIAGNOSIS — G8929 Other chronic pain: Secondary | ICD-10-CM | POA: Insufficient documentation

## 2020-05-07 DIAGNOSIS — M25511 Pain in right shoulder: Secondary | ICD-10-CM | POA: Insufficient documentation

## 2020-05-07 DIAGNOSIS — M25562 Pain in left knee: Secondary | ICD-10-CM | POA: Insufficient documentation

## 2020-05-07 DIAGNOSIS — M25612 Stiffness of left shoulder, not elsewhere classified: Secondary | ICD-10-CM | POA: Insufficient documentation

## 2020-05-07 DIAGNOSIS — R293 Abnormal posture: Secondary | ICD-10-CM | POA: Insufficient documentation

## 2020-05-08 NOTE — Therapy (Signed)
[place], [place], [address] Phone: [phone]   Fax:  [phone]  Physical Therapy Treatment  Patient Details  Name: Denise Morales MRN: [id number] Date of Birth: 1961/06/14 No data recorded  Encounter Date: 05/07/2020   PT End of Session - 05/07/20 0912    Visit Number 1    Number of Visits 17    Date for PT Re-Evaluation 06/26/20    Authorization Type MEDICAID [place]-FAMILY PLANNING    Authorization - Visit Number 1    Authorization - Number of Visits 3    Progress Note Due on Visit 10    PT Start Time 0915    PT Stop Time 0957    PT Time Calculation (min) 42 min    Activity Tolerance Patient tolerated treatment well    Behavior During Therapy [facility] for tasks assessed/performed           History reviewed. No pertinent past medical history.  Past Surgical History:  Procedure Laterality Date  . ABDOMINAL HYSTERECTOMY  1985    There were no vitals filed for this visit.   Subjective Assessment - 05/07/20 0922    Subjective Pt reports her Bilat shoulder pain is the same and she did not notice a difference after the iontophoresis treatment on the R shoulder. Today her Bilat sh pain is 3/10, but her activity level has been low so far today.    Currently in Pain? Yes    Pain Score 3     Pain Location Shoulder    Pain Orientation Right;Left    Pain Descriptors / Indicators Aching;Sharp;Burning    Pain Type Chronic pain    Pain Radiating Towards R elbow    Pain Onset More than a month ago    Pain Frequency Constant                             [facility] Adult PT Treatment/Exercise - 05/08/20 0001      Exercises   Exercises Shoulder      Shoulder Exercises: Supine   Protraction Both;10 reps    ABduction Both;10 reps    Theraband Level (Shoulder ABduction) Level 1 (Yellow)    ABduction Limitations horizonal      Shoulder Exercises: Seated   External Rotation Both;10 reps     Theraband Level (Shoulder External Rotation) Level 1 (Yellow)      Shoulder Exercises: Standing   Extension Both;10 reps    Theraband Level (Shoulder Extension) Level 2 (Red)    Row Both;10 reps    Theraband Level (Shoulder Row) Level 2 (Red)      Modalities   Modalities Iontophoresis      Iontophoresis   Type of Iontophoresis Dexamethasone    Location B shoulders    Dose 4mg /ml 67ml    Time 6 hours                  PT Education - 05/08/20 1614    Education Details HEP for RC and peri-scapular strengthening.    Person(s) Educated Patient    Methods Explanation;Demonstration;Tactile cues;Verbal cues;Handout    Comprehension Verbalized understanding;Returned demonstration;Verbal cues required;Tactile cues required            PT Short Term Goals - 04/24/20 1046      PT SHORT TERM GOAL #1   Title Pt will be Ind in an initial HEP.    Status New    Target Date  05/15/20      PT SHORT TERM GOAL #2   Title Pt will voice understanding of measure to reduce and manage her pain.    Target Date 05/15/20             PT Long Term Goals - 04/24/20 1048      PT LONG TERM GOAL #1   Title Increase B shoulder flexion ROM to 120d to promote functional use of UEs    Baseline 100d    Target Date 06/26/20      PT LONG TERM GOAL #2   Title Pt will report a decrease in B shoulder pain to a range of 2-7/10 with daily activities for improved functional use    Baseline 5-10/10    Status New    Target Date 06/26/20      PT LONG TERM GOAL #3   Title Pt will report tolerating moderate activity in her home ie vacuuming and carrying groceries with moderate difficulty    Baseline marked difficulty    Status New    Target Date 06/26/20      PT LONG TERM GOAL #4   Title Pt's FOTO score for fxal ability will improve to 43% demonstrating improved fxal ability    Baseline 32%    Status New    Target Date 06/26/20      PT LONG TERM GOAL #5   Title Pt will be Ind in a final HEP to  maintain and progress achieved LOF    Status New    Target Date 06/26/20                 Plan - 05/07/20 0912    Clinical Impression Statement HEP was revised with upper trap/GH jt distraction ex removed. Ther ex was completed for light RC and peri-scapular strengthening exs. Iontophoresis was administered to each shoulder. Pt reports a min increase in bilat shoulder pain following the completion of the exs. Pt will continue to benefit from PT for the development of a HEP for her shoulders, and assessment of her low back and L knee as indicated.    Personal Factors and Comorbidities Age;Time since onset of injury/illness/exacerbation;Past/Current Experience    Examination-Activity Limitations Locomotion Level;Transfers;Reach Overhead;Sleep;Stairs;Stand;Lift    Stability/Clinical Decision Making Evolving/Moderate complexity    Clinical Decision Making Moderate    Rehab Potential Fair    PT Frequency 2x / week    PT Duration 8 weeks    PT Treatment/Interventions ADLs/Self Care Home Management;Cryotherapy;Electrical Stimulation;Iontophoresis 4mg /ml Dexamethasone;Moist Heat;Traction;Ultrasound;Therapeutic exercise;Therapeutic activities;Functional mobility training;Stair training;Gait training;Patient/family education;Energy conservation;Dry needling;Passive range of motion;Taping;Vasopneumatic Device;Spinal Manipulations;Joint Manipulations    PT Next Visit Plan Assess respnse to HEP; Assess low back and L knee as tolerated, progress HEP and utilize modalities as indicated.    PT Home Exercise Plan [id number]    Consulted and Agree with Plan of Care Patient           Patient will benefit from skilled therapeutic intervention in order to improve the following deficits and impairments:  Abnormal gait, Decreased range of motion, Difficulty walking, Impaired UE functional use, Obesity, Decreased activity tolerance, Pain, Improper body mechanics, Postural dysfunction, Decreased strength,  Decreased mobility  Visit Diagnosis: Chronic pain in right shoulder  Chronic left shoulder pain  Decreased ROM of left shoulder  Decreased ROM of right shoulder  Abnormal posture     Problem List There are no problems to display for this patient.  Gar Ponto MS, PT 05/08/20 4:42 PM  [place]  [facility] [address] [place], [place], [address] Phone: [phone]   Fax:  [phone]  Name: Denise Morales MRN: [id number] Date of Birth: [birth year]

## 2020-05-11 ENCOUNTER — Ambulatory Visit: Payer: Self-pay

## 2020-05-11 ENCOUNTER — Other Ambulatory Visit: Payer: Self-pay

## 2020-05-11 DIAGNOSIS — M25612 Stiffness of left shoulder, not elsewhere classified: Secondary | ICD-10-CM

## 2020-05-11 DIAGNOSIS — G8929 Other chronic pain: Secondary | ICD-10-CM

## 2020-05-11 DIAGNOSIS — M25611 Stiffness of right shoulder, not elsewhere classified: Secondary | ICD-10-CM

## 2020-05-11 DIAGNOSIS — R293 Abnormal posture: Secondary | ICD-10-CM

## 2020-05-11 DIAGNOSIS — M25512 Pain in left shoulder: Secondary | ICD-10-CM

## 2020-05-11 NOTE — Therapy (Signed)
[place], [place], [address] Phone: [phone]   Fax:  [phone]  Physical Therapy Treatment  Patient Details  Name: Denise Morales MRN: [id number] Date of Birth: 1961-06-17 No data recorded  Encounter Date: 05/11/2020   PT End of Session - 05/11/20 0925    Visit Number 3    Number of Visits 17    Authorization Type MEDICAID [place]-FAMILY PLANNING    Authorization - Visit Number 2    Authorization - Number of Visits 3    Progress Note Due on Visit 10    PT Start Time 0917    PT Stop Time 1001    PT Time Calculation (min) 44 min    Activity Tolerance Patient tolerated treatment well    Behavior During Therapy [facility] for tasks assessed/performed           History reviewed. No pertinent past medical history.  Past Surgical History:  Procedure Laterality Date  . ABDOMINAL HYSTERECTOMY  1985    There were no vitals filed for this visit.   Subjective Assessment - 05/11/20 0921    Subjective Pt reports her shoulders are no better or wrose her HEP. She states her low back bothered her this weekend and is currebtlya 6/10.    Currently in Pain? Yes    Pain Score 8     Pain Location Shoulder    Pain Orientation Right;Left    Pain Descriptors / Indicators Aching;Burning;Sharp    Pain Type Chronic pain    Pain Radiating Towards lateral R arm    Pain Onset More than a month ago    Pain Frequency Constant    Pain Score 6    Pain Location Back    Pain Orientation Lower    Pain Descriptors / Indicators Aching    Pain Onset More than a month ago    Pain Frequency Constant                             [facility] Adult PT Treatment/Exercise - 05/11/20 0001      Exercises   Exercises Shoulder;Lumbar      Lumbar Exercises: Stretches   Other Lumbar Stretch Exercise Sitting forward trunk flexion in sitting, 5x, 10 sec      Lumbar Exercises: Supine   Pelvic Tilt 10 reps    Pelvic Tilt  Limitations 3 secs    Other Supine Lumbar Exercises mini-crunch, 10x, min. arm use      Shoulder Exercises: Supine   Protraction Both;15 reps    Protraction Limitations 2 sets    ABduction Both;15 reps    Theraband Level (Shoulder ABduction) Level 1 (Yellow)    ABduction Limitations horizonal      Shoulder Exercises: Standing   Extension Both;15 reps    Theraband Level (Shoulder Extension) Level 2 (Red)    Row Both;15 reps    Theraband Level (Shoulder Row) Level 2 (Red)                    PT Short Term Goals - 04/24/20 1046      PT SHORT TERM GOAL #1   Title Pt will be Ind in an initial HEP.    Status New    Target Date 05/15/20      PT SHORT TERM GOAL #2   Title Pt will voice understanding of measure to reduce and manage her pain.    Target Date 05/15/20  PT Long Term Goals - 04/24/20 1048      PT LONG TERM GOAL #1   Title Increase B shoulder flexion ROM to 120d to promote functional use of UEs    Baseline 100d    Target Date 06/26/20      PT LONG TERM GOAL #2   Title Pt will report a decrease in B shoulder pain to a range of 2-7/10 with daily activities for improved functional use    Baseline 5-10/10    Status New    Target Date 06/26/20      PT LONG TERM GOAL #3   Title Pt will report tolerating moderate activity in her home ie vacuuming and carrying groceries with moderate difficulty    Baseline marked difficulty    Status New    Target Date 06/26/20      PT LONG TERM GOAL #4   Title Pt's FOTO score for fxal ability will improve to 43% demonstrating improved fxal ability    Baseline 32%    Status New    Target Date 06/26/20      PT LONG TERM GOAL #5   Title Pt will be Ind in a final HEP to maintain and progress achieved LOF    Status New    Target Date 06/26/20                 Plan - 05/11/20 1005    Clinical Impression Statement Ther ex/HEP were initiated for decreasing posterior lumbar compression through lumbar  flexion stretching and abdominal strengthening. Peri-scapular strengthening exs for the shoulders were continued.for postural improvement and GH decompression. Pt has not noticed benefit to her shoulders with the iontophoresis treatments.    Personal Factors and Comorbidities Age;Time since onset of injury/illness/exacerbation;Past/Current Experience    Examination-Activity Limitations Locomotion Level;Transfers;Reach Overhead;Sleep;Stairs;Stand;Lift    Stability/Clinical Decision Making Evolving/Moderate complexity    Clinical Decision Making Moderate    Rehab Potential Fair    PT Frequency 2x / week    PT Duration 8 weeks    PT Treatment/Interventions ADLs/Self Care Home Management;Cryotherapy;Electrical Stimulation;Iontophoresis 4mg /ml Dexamethasone;Moist Heat;Traction;Ultrasound;Therapeutic exercise;Therapeutic activities;Functional mobility training;Stair training;Gait training;Patient/family education;Energy conservation;Dry needling;Passive range of motion;Taping;Vasopneumatic Device;Spinal Manipulations;Joint Manipulations    PT Next Visit Plan Assess response to adjustments to HEP for shouldrs and HEP started for low back    PT Home Exercise Plan [id number]    Consulted and Agree with Plan of Care Patient           Patient will benefit from skilled therapeutic intervention in order to improve the following deficits and impairments:  Abnormal gait, Decreased range of motion, Difficulty walking, Impaired UE functional use, Obesity, Decreased activity tolerance, Pain, Improper body mechanics, Postural dysfunction, Decreased strength, Decreased mobility  Visit Diagnosis: Chronic pain in right shoulder  Chronic left shoulder pain  Decreased ROM of left shoulder  Decreased ROM of right shoulder  Abnormal posture     Problem List There are no problems to display for this patient.  Gar Ponto MS, PT 05/11/20 10:18 AM  [facility] [address] [place], [place], [address] Phone: [phone]   Fax:  [phone]  Name: Denise Morales MRN: [id number] Date of Birth: [birth year]

## 2020-05-13 ENCOUNTER — Ambulatory Visit: Payer: Self-pay

## 2020-05-13 ENCOUNTER — Other Ambulatory Visit: Payer: Self-pay

## 2020-05-13 DIAGNOSIS — R293 Abnormal posture: Secondary | ICD-10-CM

## 2020-05-13 DIAGNOSIS — M25511 Pain in right shoulder: Secondary | ICD-10-CM

## 2020-05-13 DIAGNOSIS — M25611 Stiffness of right shoulder, not elsewhere classified: Secondary | ICD-10-CM

## 2020-05-13 DIAGNOSIS — G8929 Other chronic pain: Secondary | ICD-10-CM

## 2020-05-13 DIAGNOSIS — M25562 Pain in left knee: Secondary | ICD-10-CM

## 2020-05-13 DIAGNOSIS — M25612 Stiffness of left shoulder, not elsewhere classified: Secondary | ICD-10-CM

## 2020-05-13 NOTE — Therapy (Signed)
[place], [place], [address] Phone: [phone]   Fax:  [phone]  Physical Therapy Treatment  Patient Details  Name: Denise Morales MRN: [id number] Date of Birth: 07-21-1960 No data recorded  Encounter Date: 05/13/2020   PT End of Session - 05/13/20 0927    Visit Number 4    Number of Visits 17    Date for PT Re-Evaluation 06/26/20    Authorization Type MEDICAID [place]-FAMILY PLANNING    Authorization - Visit Number --    Authorization - Number of Visits --    Progress Note Due on Visit 10    PT Start Time 0918    PT Stop Time 1000    PT Time Calculation (min) 42 min    Activity Tolerance Patient tolerated treatment well    Behavior During Therapy [facility] for tasks assessed/performed           History reviewed. No pertinent past medical history.  Past Surgical History:  Procedure Laterality Date  . ABDOMINAL HYSTERECTOMY  1985    There were no vitals filed for this visit.   Subjective Assessment - 05/13/20 0923    Subjective Pt reports no change with the pain in her shoulders or her low back    Currently in Pain? Yes    Pain Score 7     Pain Location Shoulder    Pain Orientation Right;Left    Pain Descriptors / Indicators Aching;Burning;Sharp;Other (Comment)   popping   Pain Type Chronic pain    Pain Radiating Towards Lateral r arm    Pain Onset More than a month ago    Aggravating Factors  Use or arm and certain movements cause pain and popping    Pain Relieving Factors Use or arm and certain movements cause pain and popping    Pain Score 6    Pain Location Back    Pain Orientation Lower    Pain Descriptors / Indicators Aching    Pain Onset More than a month ago    Pain Frequency Constant                             [facility] Adult PT Treatment/Exercise - 05/13/20 0001      Exercises   Exercises Lumbar;Shoulder;Knee/Hip      Knee/Hip Exercises: Supine   Quad Sets  Left;10 reps;AROM    Quad Sets Limitations 3 sec    Short Arc Quad Sets Left;10 reps    Short Arc Quad Sets Limitations 3 sec    Heel Slides AROM;Left;10 reps    Heel Slides Limitations pain free range    Straight Leg Raises --   painfull to L knee and hip   Other Supine Knee/Hip Exercises AROMHip abd/ad 10x       Manual Therapy   Manual Therapy Taping    Manual therapy comments KT taping to the L knee laterally to minimize L medial knee pain                   PT Education - 05/13/20 1012    Education Details HEP for L knee/LE strengthening and ROM exs    Person(s) Educated Patient    Methods Explanation;Demonstration;Tactile cues;Verbal cues;Handout    Comprehension Verbalized understanding;Returned demonstration;Verbal cues required;Tactile cues required;Need further instruction            PT Short Term Goals - 04/24/20 1046      PT SHORT  TERM GOAL #1   Title Pt will be Ind in an initial HEP.    Status New    Target Date 05/15/20      PT SHORT TERM GOAL #2   Title Pt will voice understanding of measure to reduce and manage her pain.    Target Date 05/15/20             PT Long Term Goals - 04/24/20 1048      PT LONG TERM GOAL #1   Title Increase B shoulder flexion ROM to 120d to promote functional use of UEs    Baseline 100d    Target Date 06/26/20      PT LONG TERM GOAL #2   Title Pt will report a decrease in B shoulder pain to a range of 2-7/10 with daily activities for improved functional use    Baseline 5-10/10    Status New    Target Date 06/26/20      PT LONG TERM GOAL #3   Title Pt will report tolerating moderate activity in her home ie vacuuming and carrying groceries with moderate difficulty    Baseline marked difficulty    Status New    Target Date 06/26/20      PT LONG TERM GOAL #4   Title Pt's FOTO score for fxal ability will improve to 43% demonstrating improved fxal ability    Baseline 32%    Status New    Target Date 06/26/20       PT LONG TERM GOAL #5   Title Pt will be Ind in a final HEP to maintain and progress achieved LOF    Status New    Target Date 06/26/20                 Plan - 05/13/20 1013    Clinical Impression Statement PT was completed for the L Knee. Therpay included ROM and strengthening exs of the L knee and LE to the pt's tolerance. Additionally. KT taping was provided to bias for lateral patellar positioning. After taping the pt reported less L knee pain with both walking and asc/dsc a step. B shoulder and the low back have not improved re: pain at this time.    Personal Factors and Comorbidities Age;Time since onset of injury/illness/exacerbation;Past/Current Experience    Examination-Activity Limitations Locomotion Level;Transfers;Reach Overhead;Sleep;Stairs;Stand;Lift    Stability/Clinical Decision Making Evolving/Moderate complexity    Clinical Decision Making Moderate    Rehab Potential Fair    PT Frequency 2x / week    PT Duration 8 weeks    PT Treatment/Interventions ADLs/Self Care Home Management;Cryotherapy;Electrical Stimulation;Iontophoresis 4mg /ml Dexamethasone;Moist Heat;Traction;Ultrasound;Therapeutic exercise;Therapeutic activities;Functional mobility training;Stair training;Gait training;Patient/family education;Energy conservation;Dry needling;Passive range of motion;Taping;Vasopneumatic Device;Spinal Manipulations;Joint Manipulations    PT Next Visit Plan Assess reponse to KT taping of the L knee and HEP for L knee, low back, b shoulders    PT Home Exercise Plan [id number]    Consulted and Agree with Plan of Care Patient           Patient will benefit from skilled therapeutic intervention in order to improve the following deficits and impairments:  Abnormal gait, Decreased range of motion, Difficulty walking, Impaired UE functional use, Obesity, Decreased activity tolerance, Pain, Improper body mechanics, Postural dysfunction, Decreased strength, Decreased mobility  Visit  Diagnosis: Chronic pain in right shoulder  Chronic left shoulder pain  Decreased ROM of left shoulder  Decreased ROM of right shoulder  Abnormal posture  Chronic pain of left knee  Problem List There are no problems to display for this patient.  Gar Ponto MS, PT 05/13/20 2:08 PM  [place] [address] [place], [place], [address] Phone: [phone]   Fax:  [phone]  Name: Denise Morales MRN: [id number] Date of Birth: Jan 27, 1961

## 2020-05-17 ENCOUNTER — Ambulatory Visit (INDEPENDENT_AMBULATORY_CARE_PROVIDER_SITE_OTHER): Payer: Self-pay | Admitting: Family Medicine

## 2020-05-17 ENCOUNTER — Encounter: Payer: Self-pay | Admitting: Family Medicine

## 2020-05-17 ENCOUNTER — Other Ambulatory Visit: Payer: Self-pay

## 2020-05-17 DIAGNOSIS — G8929 Other chronic pain: Secondary | ICD-10-CM

## 2020-05-17 DIAGNOSIS — M545 Low back pain, unspecified: Secondary | ICD-10-CM

## 2020-05-17 DIAGNOSIS — M25562 Pain in left knee: Secondary | ICD-10-CM

## 2020-05-17 DIAGNOSIS — M25511 Pain in right shoulder: Secondary | ICD-10-CM

## 2020-05-17 NOTE — Progress Notes (Signed)
Office Visit Note   Patient: Denise Morales           Date of Birth: 02/18/1961           MRN: [id number] Visit Date: 05/17/2020 Requested by: Kerin Perna, NP [address] [place],  [place] [address] PCP: Kerin Perna, NP  Subjective: Chief Complaint  Patient presents with  . Lower Back - Pain, Follow-up  . Right Shoulder - Pain, Follow-up    6 weeks follow up - still in PT. Started with the shoulder, then the back and then with the knees last week. I am "feeling so weak," due to the pain (all over).  . Right Knee - Pain, Follow-up  . Left Knee - Pain, Follow-up    HPI: She is here for follow-up chronic bilateral shoulder pain, low back pain, left knee pain.  She has been doing physical therapy and it helps while she is there, but relief is not lasting.  Right shoulder hurts the most, it pops intermittently and is very painful when she uses it.  Left shoulder does not bother her quite as much.  Low back hurts when she tries to walk, feels better when she sits down.  The left knee continues to pop when she moves it in certain positions.  It did better when physical therapy used taping, but she is not able to do that on her own.                ROS:   All other systems were reviewed and are negative.  Objective: Vital Signs: There were no vitals taken for this visit.  Physical Exam:  General:  Alert and oriented, in no acute distress. Pulm:  Breathing unlabored. Psy:  Normal mood, congruent affect.  Right shoulder: Full active range of motion, pain with overhead reach.  Tender in the posterior subacromial space.  Empty can test causes pain but she still has adequate rotator cuff strength. Left shoulder: She has early adhesive capsulitis still.  Not has painful with resisted strength testing. Low back: Tender across midline L5-S1 level. Left knee: Trace effusion with no warmth.  She has pain with patella compression.    Imaging: No results found.  Assessment  & Plan: 1.  Chronic right greater than left shoulder pain -Discussed options and elected to inject the right shoulder subacromial space.  If no improvement, we will obtain x-rays and possibly MRI scan.  2.  Chronic low back pain, question stenosis -Continue with physical therapy.  Epidural injection versus x-rays/MRI scan if she fails to improve.  3.  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  X-rays and MRI scan if symptoms persist.     Procedures: Right shoulder injection: After sterile prep with Betadine, injected 5 cc 1% lidocaine without epinephrine and 40 mg methylprednisolone from posterior approach into the subacromial space.  Left knee injection: After sterile prep with Betadine, injected 5 cc 1% lidocaine without epinephrine and 40 mg methylprednisolone from lateral midpatellar approach.    PMFS History: Patient Active Problem List   Diagnosis Date Noted  . Hypertensive disorder 05/05/2019   History reviewed. No pertinent past medical history.  Family History  Problem Relation Age of Onset  . Diabetes Father   . Diabetes Sister   . Diabetes Paternal Aunt     Past Surgical History:  Procedure Laterality Date  . ABDOMINAL HYSTERECTOMY  1985   Social History   Occupational History  . Not on file  Tobacco  Use  . Smoking status: Never Smoker  . Smokeless tobacco: Never Used  Substance and Sexual Activity  . Alcohol use: Not Currently  . Drug use: Never  . Sexual activity: Not Currently

## 2020-05-18 ENCOUNTER — Ambulatory Visit: Payer: Self-pay

## 2020-05-18 DIAGNOSIS — R293 Abnormal posture: Secondary | ICD-10-CM

## 2020-05-18 DIAGNOSIS — M25511 Pain in right shoulder: Secondary | ICD-10-CM

## 2020-05-18 DIAGNOSIS — M25512 Pain in left shoulder: Secondary | ICD-10-CM

## 2020-05-18 DIAGNOSIS — M25612 Stiffness of left shoulder, not elsewhere classified: Secondary | ICD-10-CM

## 2020-05-18 DIAGNOSIS — M25562 Pain in left knee: Secondary | ICD-10-CM

## 2020-05-18 DIAGNOSIS — G8929 Other chronic pain: Secondary | ICD-10-CM

## 2020-05-18 DIAGNOSIS — M25611 Stiffness of right shoulder, not elsewhere classified: Secondary | ICD-10-CM

## 2020-05-18 NOTE — Therapy (Signed)
[place], [place], [address] Phone: [phone]   Fax:  [phone]  Physical Therapy Treatment  Patient Details  Name: Denise Morales MRN: [id number] Date of Birth: [birth year] No data recorded  Encounter Date: 05/18/2020   PT End of Session - 05/18/20 1120    Visit Number 5    Number of Visits 17    Date for PT Re-Evaluation 06/26/20    Authorization Type MEDICAID [place]-FAMILY PLANNING    Progress Note Due on Visit 10    PT Start Time 0918    PT Stop Time 1002    PT Time Calculation (min) 44 min    Activity Tolerance Patient tolerated treatment well    Behavior During Therapy [facility] for tasks assessed/performed           History reviewed. No pertinent past medical history.  Past Surgical History:  Procedure Laterality Date  . ABDOMINAL HYSTERECTOMY  1985    There were no vitals filed for this visit.   Subjective Assessment - 05/18/20 0921    Subjective Pt reports her R shoulder and L knee are feeling better after injections yesterday c Dr. Junius Roads. She states her low back is feeling OK, with her not experiencing pain.    Limitations Lifting;Standing;Walking;House hold activities    Currently in Pain? Yes    Pain Score 5     Pain Orientation Right;Left    Pain Descriptors / Indicators Aching;Burning;Sharp    Pain Type Chronic pain    Pain Radiating Towards Lateral R arm    Pain Onset More than a month ago    Pain Frequency Constant    Multiple Pain Sites Yes    Pain Score 0    Pain Location Back    Pain Orientation Lower    Pain Descriptors / Indicators Aching    Pain Type Chronic pain    Pain Onset More than a month ago    Pain Frequency Constant    Pain Score 7    Pain Location Knee    Pain Orientation Left    Pain Descriptors / Indicators Aching;Jabbing;Sharp    Pain Type Chronic pain    Pain Onset More than a month ago    Pain Frequency Constant    Aggravating Factors  WBing     Pain Relieving Factors rest, KT taping    Effect of Pain on Daily Activities Significant impact                             [facility] Adult PT Treatment/Exercise - 05/18/20 0001      Exercises   Exercises Knee/Hip      Knee/Hip Exercises: Supine   Quad Sets Left;10 reps;AROM    Quad Sets Limitations 3 sec    Short Arc Quad Sets Left;10 reps    Short Arc Quad Sets Limitations 3 sec    Other Supine Knee/Hip Exercises AROMHip abd/ad 10x       Manual Therapy   Manual Therapy Taping    Manual therapy comments KT taping to the L patella laterally to minimize L medial knee pain                   PT Education - 05/18/20 1119    Education Details Pt Ed. for apllication of KT tape laterally to the L patella. Review of all HEPs for purpose.    Person(s) Educated Patient    Methods  Explanation;Demonstration    Comprehension Verbalized understanding;Returned demonstration            PT Short Term Goals - 05/18/20 1129      PT SHORT TERM GOAL #1   Title Pt will be Ind in an initial HEP.    Status Achieved    Target Date 05/18/20      PT SHORT TERM GOAL #2   Title Pt will voice understanding of measure to reduce and manage her pain.    Status Achieved    Target Date 05/18/20             PT Long Term Goals - 04/24/20 1048      PT LONG TERM GOAL #1   Title Increase B shoulder flexion ROM to 120d to promote functional use of UEs    Baseline 100d    Target Date 06/26/20      PT LONG TERM GOAL #2   Title Pt will report a decrease in B shoulder pain to a range of 2-7/10 with daily activities for improved functional use    Baseline 5-10/10    Status New    Target Date 06/26/20      PT LONG TERM GOAL #3   Title Pt will report tolerating moderate activity in her home ie vacuuming and carrying groceries with moderate difficulty    Baseline marked difficulty    Status New    Target Date 06/26/20      PT LONG TERM GOAL #4   Title Pt's FOTO score for  fxal ability will improve to 43% demonstrating improved fxal ability    Baseline 32%    Status New    Target Date 06/26/20      PT LONG TERM GOAL #5   Title Pt will be Ind in a final HEP to maintain and progress achieved LOF    Status New    Target Date 06/26/20                 Plan - 05/18/20 1120    Clinical Impression Statement Reviewed all HEPs for the shoulders, low back and L knee. Pt voiced understanding re: purpose of all exs and then completed strengthening exercises for the L knee. Pt tolerated the L knee exs without adverse effects. KT taping laterally to the L patella was completed and Ed. for application by pt was provided. Pt returned demonstration for the KT application. Following the KT taping to the L knee on 11/18, pt reported improved pain until the tape came off on 05/16/20. Pt plans to obtain KT tape and try at home.    Personal Factors and Comorbidities Age;Time since onset of injury/illness/exacerbation;Past/Current Experience    Examination-Activity Limitations Locomotion Level;Transfers;Reach Overhead;Sleep;Stairs;Stand;Lift    Stability/Clinical Decision Making Evolving/Moderate complexity    Clinical Decision Making Moderate    Rehab Potential Fair    PT Frequency 2x / week    PT Duration 8 weeks    PT Treatment/Interventions ADLs/Self Care Home Management;Cryotherapy;Electrical Stimulation;Iontophoresis 4mg /ml Dexamethasone;Moist Heat;Traction;Ultrasound;Therapeutic exercise;Therapeutic activities;Functional mobility training;Stair training;Gait training;Patient/family education;Energy conservation;Dry needling;Passive range of motion;Taping;Vasopneumatic Device;Spinal Manipulations;Joint Manipulations    PT Next Visit Plan Continue KT taping to the L knee and ther ex to shoulders, low back and L knee    PT Home Exercise Plan [id number]    Consulted and Agree with Plan of Care Patient           Patient will benefit from skilled therapeutic intervention  in order to improve the following deficits  and impairments:  Abnormal gait, Decreased range of motion, Difficulty walking, Impaired UE functional use, Obesity, Decreased activity tolerance, Pain, Improper body mechanics, Postural dysfunction, Decreased strength, Decreased mobility  Visit Diagnosis: Chronic pain in right shoulder  Chronic left shoulder pain  Decreased ROM of left shoulder  Decreased ROM of right shoulder  Abnormal posture  Chronic pain of left knee     Problem List Patient Active Problem List   Diagnosis Date Noted  . Hypertensive disorder 05/05/2019    Gar Ponto MS, PT 05/18/20 11:37 AM  [facility] [address] [place], [place], [address] Phone: [phone]   Fax:  [phone]  Name: Denise Morales MRN: [id number] Date of Birth: March 05, 1961

## 2020-05-25 ENCOUNTER — Other Ambulatory Visit: Payer: Self-pay

## 2020-05-25 ENCOUNTER — Ambulatory Visit: Payer: Self-pay

## 2020-05-25 DIAGNOSIS — M25612 Stiffness of left shoulder, not elsewhere classified: Secondary | ICD-10-CM

## 2020-05-25 DIAGNOSIS — M25511 Pain in right shoulder: Secondary | ICD-10-CM

## 2020-05-25 DIAGNOSIS — M25611 Stiffness of right shoulder, not elsewhere classified: Secondary | ICD-10-CM

## 2020-05-25 DIAGNOSIS — R293 Abnormal posture: Secondary | ICD-10-CM

## 2020-05-25 DIAGNOSIS — G8929 Other chronic pain: Secondary | ICD-10-CM

## 2020-05-25 NOTE — Therapy (Signed)
[place], [place], [address] Phone: [phone]   Fax:  [phone]  Physical Therapy Treatment  Patient Details  Name: Denise Morales MRN: [id number] Date of Birth: 04/11/1961 No data recorded  Encounter Date: 05/25/2020   PT End of Session - 05/25/20 5400    Visit Number 6    Number of Visits 17    Date for PT Re-Evaluation 06/26/20    Authorization Type MEDICAID [place]-FAMILY PLANNING    Progress Note Due on Visit 10    PT Start Time 0918    PT Stop Time 1000    PT Time Calculation (min) 42 min    Activity Tolerance Patient tolerated treatment well    Behavior During Therapy [facility] for tasks assessed/performed           History reviewed. No pertinent past medical history.  Past Surgical History:  Procedure Laterality Date  . ABDOMINAL HYSTERECTOMY  1985    There were no vitals filed for this visit.   Subjective Assessment - 05/25/20 0925    Subjective Pt reports her bilat shoulder pain is returning following the injections. She states the KT taping is helping to decrease the pain some.    Currently in Pain? Yes    Pain Location Shoulder    Pain Orientation Right;Left    Pain Descriptors / Indicators Aching;Burning;Sharp    Pain Type Chronic pain    Pain Radiating Towards LateralR arm    Pain Onset More than a month ago    Pain Frequency Intermittent    Aggravating Factors  Use or arm and certain movements cause pain and popping    Pain Relieving Factors Rest    Effect of Pain on Daily Activities Significant    Multiple Pain Sites Yes    Pain Score 8    Pain Location Back    Pain Orientation Lower;Posterior    Pain Descriptors / Indicators Aching    Pain Type Chronic pain    Pain Onset More than a month ago    Pain Frequency Intermittent    Pain Score 6    Pain Location Knee    Pain Orientation Left    Pain Descriptors / Indicators Aching;Jabbing;Sharp    Pain Type Chronic pain     Pain Onset More than a month ago    Pain Frequency Intermittent    Aggravating Factors  WBing    Pain Relieving Factors rest, KT taping    Effect of Pain on Daily Activities Significant                             [facility] Adult PT Treatment/Exercise - 05/25/20 0001      Exercises   Exercises Knee/Hip;Lumbar      Lumbar Exercises: Stretches   Active Hamstring Stretch Right;Left;3 reps;20 seconds    Active Hamstring Stretch Limitations seated    Other Lumbar Stretch Exercise trunk flexion seated 3x, 10 sec      Lumbar Exercises: Supine   Pelvic Tilt 10 reps    Pelvic Tilt Limitations 3 sec    Other Supine Lumbar Exercises Mini-crunch 10x, reach hands toward ankles      Knee/Hip Exercises: Supine   Quad Sets Left;10 reps;AROM    Quad Sets Limitations 3 secs    Short Arc Quad Sets Left;10 reps    Short Arc Quad Sets Limitations 3 sec    Heel Slides AROM;Left;10 reps  Heel Slides Limitations pain free range    Straight Leg Raises Left;10 reps    Straight Leg Raises Limitations limited range due to pain    Other Supine Knee/Hip Exercises AROMHip abd/ad 10x                     PT Short Term Goals - 05/18/20 1129      PT SHORT TERM GOAL #1   Title Pt will be Ind in an initial HEP.    Status Achieved    Target Date 05/18/20      PT SHORT TERM GOAL #2   Title Pt will voice understanding of measure to reduce and manage her pain.    Status Achieved    Target Date 05/18/20             PT Long Term Goals - 04/24/20 1048      PT LONG TERM GOAL #1   Title Increase B shoulder flexion ROM to 120d to promote functional use of UEs    Baseline 100d    Target Date 06/26/20      PT LONG TERM GOAL #2   Title Pt will report a decrease in B shoulder pain to a range of 2-7/10 with daily activities for improved functional use    Baseline 5-10/10    Status New    Target Date 06/26/20      PT LONG TERM GOAL #3   Title Pt will report tolerating moderate  activity in her home ie vacuuming and carrying groceries with moderate difficulty    Baseline marked difficulty    Status New    Target Date 06/26/20      PT LONG TERM GOAL #4   Title Pt's FOTO score for fxal ability will improve to 43% demonstrating improved fxal ability    Baseline 32%    Status New    Target Date 06/26/20      PT LONG TERM GOAL #5   Title Pt will be Ind in a final HEP to maintain and progress achieved LOF    Status New    Target Date 06/26/20                 Plan - 05/25/20 6333    Clinical Impression Statement KT tape seems to be providing some knee pain relief on a consistent basis. Pt is having intermittent shoulder and back pain which occurs at a high level with activity, around 8/10. PT was provided to decrease low back compressive forces through strectching and core strengthening. Strengthening for the L knee/LE were completed as well. Pt tolerated the session with adverse effects.    Personal Factors and Comorbidities Age;Time since onset of injury/illness/exacerbation;Past/Current Experience    Examination-Activity Limitations Locomotion Level;Transfers;Reach Overhead;Sleep;Stairs;Stand;Lift    Stability/Clinical Decision Making Evolving/Moderate complexity    Clinical Decision Making Moderate    Rehab Potential Fair    PT Frequency 2x / week    PT Duration 8 weeks    PT Treatment/Interventions ADLs/Self Care Home Management;Cryotherapy;Electrical Stimulation;Iontophoresis 4mg /ml Dexamethasone;Moist Heat;Traction;Ultrasound;Therapeutic exercise;Therapeutic activities;Functional mobility training;Stair training;Gait training;Patient/family education;Energy conservation;Dry needling;Passive range of motion;Taping;Vasopneumatic Device;Spinal Manipulations;Joint Manipulations    PT Next Visit Plan Complete iontophoresis or continue KT taping to the L knee, Continuing strengthening exs for the L knee, abs, back to promote function.    PT Home Exercise Plan  [id number]    [id number] and Agree with Plan of Care Patient           Patient will  benefit from skilled therapeutic intervention in order to improve the following deficits and impairments:  Abnormal gait, Decreased range of motion, Difficulty walking, Impaired UE functional use, Obesity, Decreased activity tolerance, Pain, Improper body mechanics, Postural dysfunction, Decreased strength, Decreased mobility  Visit Diagnosis: Chronic pain in right shoulder  Chronic left shoulder pain  Decreased ROM of left shoulder  Decreased ROM of right shoulder  Abnormal posture  Chronic pain of left knee     Problem List Patient Active Problem List   Diagnosis Date Noted  . Hypertensive disorder 05/05/2019    Gar Ponto MS, PT 05/25/20 2:12 PM  [facility] [address] [place], [place], [address] Phone: [phone]   Fax:  [phone]  Name: Adja Ruff MRN: [id number] Date of Birth: 1961-02-01

## 2020-05-26 ENCOUNTER — Telehealth: Payer: Self-pay | Admitting: Family Medicine

## 2020-05-26 ENCOUNTER — Telehealth: Payer: Self-pay

## 2020-05-26 DIAGNOSIS — M25562 Pain in left knee: Secondary | ICD-10-CM

## 2020-05-26 DIAGNOSIS — G8929 Other chronic pain: Secondary | ICD-10-CM

## 2020-05-26 NOTE — Telephone Encounter (Signed)
1) Can she also have an MRI on her lower back?  & 2) Should she hold off on PT until after the MRIs?

## 2020-05-26 NOTE — Telephone Encounter (Signed)
S/p right shoulder and left knee injections 05/17/20. Please advise.

## 2020-05-26 NOTE — Addendum Note (Signed)
Addended by: Hortencia Pilar on: 05/26/2020 03:01 PM   Modules accepted: Orders

## 2020-05-26 NOTE — Telephone Encounter (Signed)
Patient called following up on injections she received she stated the injection worked for about a day or 2 but has gotten worse. She stated her left knee still hurts her along with her back. [phone]

## 2020-05-26 NOTE — Telephone Encounter (Signed)
MRI scans ordered.

## 2020-05-26 NOTE — Telephone Encounter (Signed)
Patient called advised she just spoke with Terri and asked if she would call her back? Patient said she need to speak with Terri again.  The number to contact Rakel is [phone]

## 2020-05-26 NOTE — Telephone Encounter (Signed)
I called and advised the patient of the plan.

## 2020-05-27 ENCOUNTER — Other Ambulatory Visit: Payer: Self-pay

## 2020-05-27 ENCOUNTER — Ambulatory Visit: Payer: Self-pay | Attending: Family Medicine

## 2020-05-27 DIAGNOSIS — M25612 Stiffness of left shoulder, not elsewhere classified: Secondary | ICD-10-CM

## 2020-05-27 DIAGNOSIS — M25611 Stiffness of right shoulder, not elsewhere classified: Secondary | ICD-10-CM

## 2020-05-27 DIAGNOSIS — R293 Abnormal posture: Secondary | ICD-10-CM

## 2020-05-27 DIAGNOSIS — M25512 Pain in left shoulder: Secondary | ICD-10-CM | POA: Insufficient documentation

## 2020-05-27 DIAGNOSIS — M25511 Pain in right shoulder: Secondary | ICD-10-CM | POA: Insufficient documentation

## 2020-05-27 DIAGNOSIS — G8929 Other chronic pain: Secondary | ICD-10-CM

## 2020-05-27 DIAGNOSIS — M25562 Pain in left knee: Secondary | ICD-10-CM | POA: Insufficient documentation

## 2020-05-27 NOTE — Telephone Encounter (Signed)
Yes, she can hold on PT.

## 2020-05-27 NOTE — Telephone Encounter (Signed)
Ordered

## 2020-05-27 NOTE — Telephone Encounter (Signed)
I spoke with the patient and advised her of the order for the Lsp MRI being added. She has already been scheduled for all 3 on 06/14/20. She went to PT this morning and it went ok. She said she will just continue with PT for now (has 3 more weeks of it) - she knows she has the option to stop until after the MRIs.

## 2020-05-27 NOTE — Telephone Encounter (Signed)
Should she hold off on PT until after the MRIs?

## 2020-05-27 NOTE — Therapy (Signed)
[place], [place], [address] Phone: [phone]   Fax:  [phone]  Physical Therapy Treatment  Patient Details  Name: Denise Morales MRN: [id number] Date of Birth: [birth year] No data recorded  Encounter Date: 05/27/2020   PT End of Session - 05/27/20 1337    Visit Number 7    Number of Visits 17    Date for PT Re-Evaluation 06/26/20    Authorization Type MEDICAID [place]-FAMILY PLANNING    Progress Note Due on Visit 10    PT Start Time 0919    PT Stop Time 1003    PT Time Calculation (min) 44 min    Activity Tolerance Patient tolerated treatment well    Behavior During Therapy [facility] for tasks assessed/performed           History reviewed. No pertinent past medical history.  Past Surgical History:  Procedure Laterality Date  . ABDOMINAL HYSTERECTOMY  1985    There were no vitals filed for this visit.   Subjective Assessment - 05/27/20 0923    Subjective Pt reports she had a bad day with her back yesterday for no apparent reason. Stretching exs helped some. Pt states the KT tape has helped her knee pain aprox. 50%. Since taking it off last night, her L knee has been hurting morePt reports she is going to be scheduled for MRIs for her shoulder and R knee and is to find out if one will be completed for the low back.    Limitations Lifting;Standing;Walking;House hold activities    Pain Score 7     Pain Location Shoulder    Pain Orientation Right;Left    Pain Descriptors / Indicators Aching;Burning;Sharp    Pain Type Chronic pain    Pain Onset More than a month ago    Pain Frequency Intermittent    Aggravating Factors  Use of arms and certain movements    Pain Relieving Factors Rest    Effect of Pain on Daily Activities Significant    Pain Score 0   9/10 yesterday   Pain Location Back    Pain Orientation Posterior;Lower    Pain Descriptors / Indicators Aching    Pain Type Chronic pain    Pain  Onset More than a month ago    Pain Frequency Intermittent    Pain Score 9    Pain Location Knee    Pain Orientation Left    Pain Descriptors / Indicators Aching;Jabbing;Sharp    Pain Onset More than a month ago    Pain Frequency Intermittent                             [facility] Adult PT Treatment/Exercise - 05/27/20 0001      Exercises   Exercises Knee/Hip;Lumbar      Lumbar Exercises: Supine   Pelvic Tilt 10 reps    Pelvic Tilt Limitations 3 sec    Other Supine Lumbar Exercises Mini-crunch 10x, reach hands toward ankles      Knee/Hip Exercises: Supine   Quad Sets Left;10 reps;AROM    Quad Sets Limitations 3 secs    Short Arc Quad Sets Left;10 reps    Short Arc Quad Sets Limitations 3 sec    Heel Slides AROM;Left;10 reps    Heel Slides Limitations improved knee flexion    Straight Leg Raises Left;10 reps    Straight Leg Raises Limitations limited range due to pain  Other Supine Knee/Hip Exercises AROMHip abd/ad 10x     Other Supine Knee/Hip Exercises Glut sets 10x, 3 sec      Shoulder Exercises: Seated   Extension Both;15 reps    Theraband Level (Shoulder Extension) Level 2 (Red)    Row Both;15 reps    Theraband Level (Shoulder Row) Level 2 (Red)      Shoulder Exercises: Standing   Extension Both    Theraband Level (Shoulder Extension) Level 2 (Red)    Row Both;15 reps    Theraband Level (Shoulder Row) Level 2 (Red)      Manual Therapy   Manual Therapy Taping    Manual therapy comments KT taping to the L patella laterally to minimize L medial knee pain                   PT Education - 05/27/20 1336    Education Details Ed. for application of KT tape laterally to the L patella    Person(s) Educated Patient    Methods Explanation;Demonstration;Tactile cues;Verbal cues    Comprehension Verbalized understanding            PT Short Term Goals - 05/18/20 1129      PT SHORT TERM GOAL #1   Title Pt will be Ind in an initial HEP.     Status Achieved    Target Date 05/18/20      PT SHORT TERM GOAL #2   Title Pt will voice understanding of measure to reduce and manage her pain.    Status Achieved    Target Date 05/18/20             PT Long Term Goals - 04/24/20 1048      PT LONG TERM GOAL #1   Title Increase B shoulder flexion ROM to 120d to promote functional use of UEs    Baseline 100d    Target Date 06/26/20      PT LONG TERM GOAL #2   Title Pt will report a decrease in B shoulder pain to a range of 2-7/10 with daily activities for improved functional use    Baseline 5-10/10    Status New    Target Date 06/26/20      PT LONG TERM GOAL #3   Title Pt will report tolerating moderate activity in her home ie vacuuming and carrying groceries with moderate difficulty    Baseline marked difficulty    Status New    Target Date 06/26/20      PT LONG TERM GOAL #4   Title Pt's FOTO score for fxal ability will improve to 43% demonstrating improved fxal ability    Baseline 32%    Status New    Target Date 06/26/20      PT LONG TERM GOAL #5   Title Pt will be Ind in a final HEP to maintain and progress achieved LOF    Status New    Target Date 06/26/20                 Plan - 05/27/20 1338    Clinical Impression Statement KT tape to the L patella for a lateral pull was re-appied c Ed. for pt. Pt is to apply on her own when this taping starts to come off. PT continued to address core strength gt the low back and strengthening and ROM for the L knee. When her low back is bothering her. the stretching exs promoting flexion have been helpful to reduce pain.  Personal Factors and Comorbidities Age;Time since onset of injury/illness/exacerbation;Past/Current Experience    Examination-Activity Limitations Locomotion Level;Transfers;Reach Overhead;Sleep;Stairs;Stand;Lift    Stability/Clinical Decision Making Evolving/Moderate complexity    Clinical Decision Making Moderate    Rehab Potential Fair    PT  Frequency 2x / week    PT Duration 8 weeks    PT Treatment/Interventions ADLs/Self Care Home Management;Cryotherapy;Electrical Stimulation;Iontophoresis 4mg /ml Dexamethasone;Moist Heat;Traction;Ultrasound;Therapeutic exercise;Therapeutic activities;Functional mobility training;Stair training;Gait training;Patient/family education;Energy conservation;Dry needling;Passive range of motion;Taping;Vasopneumatic Device;Spinal Manipulations;Joint Manipulations    PT Next Visit Plan Complete iontophoresis or continue KT taping to the L knee, Continuing strengthening exs for the L knee, abs, back to promote function.    PT Home Exercise Plan [email or web address]    Consulted and Agree with Plan of Care Patient           Patient will benefit from skilled therapeutic intervention in order to improve the following deficits and impairments:  Abnormal gait, Decreased range of motion, Difficulty walking, Impaired UE functional use, Obesity, Decreased activity tolerance, Pain, Improper body mechanics, Postural dysfunction, Decreased strength, Decreased mobility  Visit Diagnosis: Chronic pain in right shoulder  Decreased ROM of left shoulder  Abnormal posture  Chronic pain of left knee  Decreased ROM of right shoulder  Chronic left shoulder pain     Problem List Patient Active Problem List   Diagnosis Date Noted  . Hypertensive disorder 05/05/2019    Gar Ponto MS, PT 05/27/20 1:53 PM   [facility] [address] [place], [place], [address] Phone: [phone]   Fax:  [phone]  Name: Denise Morales MRN: [id number] Date of Birth: 1960-07-09

## 2020-06-01 ENCOUNTER — Ambulatory Visit: Payer: Self-pay

## 2020-06-01 ENCOUNTER — Other Ambulatory Visit: Payer: Self-pay

## 2020-06-01 DIAGNOSIS — M25512 Pain in left shoulder: Secondary | ICD-10-CM

## 2020-06-01 DIAGNOSIS — G8929 Other chronic pain: Secondary | ICD-10-CM

## 2020-06-01 DIAGNOSIS — R293 Abnormal posture: Secondary | ICD-10-CM

## 2020-06-01 DIAGNOSIS — M25611 Stiffness of right shoulder, not elsewhere classified: Secondary | ICD-10-CM

## 2020-06-01 DIAGNOSIS — M25612 Stiffness of left shoulder, not elsewhere classified: Secondary | ICD-10-CM

## 2020-06-01 DIAGNOSIS — M25562 Pain in left knee: Secondary | ICD-10-CM

## 2020-06-01 NOTE — Therapy (Signed)
[place], [place], [address] Phone: [phone]   Fax:  [phone]  Physical Therapy Treatment  Patient Details  Name: Denise Morales MRN: [id number] Date of Birth: Feb 16, 1961 No data recorded  Encounter Date: 06/01/2020   PT End of Session - 06/01/20 1658    Visit Number 8    Number of Visits 17    Date for PT Re-Evaluation 06/26/20    Authorization Type MEDICAID [place]-FAMILY PLANNING    Progress Note Due on Visit 10    PT Start Time 0917    PT Stop Time 0959    PT Time Calculation (min) 42 min    Activity Tolerance Patient tolerated treatment well    Behavior During Therapy [facility] for tasks assessed/performed           History reviewed. No pertinent past medical history.  Past Surgical History:  Procedure Laterality Date  . ABDOMINAL HYSTERECTOMY  1985    There were no vitals filed for this visit.   Subjective Assessment - 06/01/20 0923    Subjective Pt reports her L knee is bothering her more today. Her low back is not bothering her much this AM, but overall is not much better. Pt reports she is to undergo a MRI fir her shoulders, low back and L knee on 06/14/20.    Limitations Lifting;Standing;Walking;House hold activities    Currently in Pain? Yes    Pain Score 7     Pain Location Shoulder    Pain Orientation Right;Left    Pain Descriptors / Indicators Aching;Burning;Sharp    Pain Type Chronic pain    Pain Onset More than a month ago    Pain Frequency Intermittent    Pain Score 0    Pain Location Back    Pain Orientation Posterior;Lower    Pain Descriptors / Indicators Aching    Pain Type Chronic pain    Pain Onset More than a month ago    Pain Frequency Intermittent    Pain Score 9    Pain Location Knee    Pain Orientation Right    Pain Descriptors / Indicators Aching;Jabbing;Sharp    Pain Type Chronic pain    Pain Onset More than a month ago    Pain Frequency Intermittent                              [facility] Adult PT Treatment/Exercise - 06/01/20 0001      Exercises   Exercises Knee/Hip;Lumbar      Lumbar Exercises: Stretches   Active Hamstring Stretch Right;Left;3 reps;20 seconds    Active Hamstring Stretch Limitations seated    Other Lumbar Stretch Exercise trunk flexion seated 3x, 10 sec      Lumbar Exercises: Supine   Pelvic Tilt 10 reps    Pelvic Tilt Limitations 3 sec    Other Supine Lumbar Exercises Mini-crunch 10x, reach hands toward ankles      Knee/Hip Exercises: Seated   Other Seated Knee/Hip Exercises B hip add sets 15x      Knee/Hip Exercises: Supine   Quad Sets Left;10 reps;AROM    Quad Sets Limitations 3 sec, with towel under L knee    Short Arc Quad Sets Limitations Increased L knee pain    Straight Leg Raises Left;10 reps    Straight Leg Raises Limitations limited range due to pain and weakness    Other Supine Knee/Hip Exercises AROMHip abd/ad 10x  Shoulder Exercises: Seated   Extension Both;15 reps    Theraband Level (Shoulder Extension) Level 2 (Red)    Row Both;15 reps    Theraband Level (Shoulder Row) Level 2 (Red)      Iontophoresis   Type of Iontophoresis Dexamethasone    Location L knee medial joint space    Dose 4 mg/ml; 1 ml    Time 6 hours                    PT Short Term Goals - 05/18/20 1129      PT SHORT TERM GOAL #1   Title Pt will be Ind in an initial HEP.    Status Achieved    Target Date 05/18/20      PT SHORT TERM GOAL #2   Title Pt will voice understanding of measure to reduce and manage her pain.    Status Achieved    Target Date 05/18/20             PT Long Term Goals - 04/24/20 1048      PT LONG TERM GOAL #1   Title Increase B shoulder flexion ROM to 120d to promote functional use of UEs    Baseline 100d    Target Date 06/26/20      PT LONG TERM GOAL #2   Title Pt will report a decrease in B shoulder pain to a range of 2-7/10 with daily activities for  improved functional use    Baseline 5-10/10    Status New    Target Date 06/26/20      PT LONG TERM GOAL #3   Title Pt will report tolerating moderate activity in her home ie vacuuming and carrying groceries with moderate difficulty    Baseline marked difficulty    Status New    Target Date 06/26/20      PT LONG TERM GOAL #4   Title Pt's FOTO score for fxal ability will improve to 43% demonstrating improved fxal ability    Baseline 32%    Status New    Target Date 06/26/20      PT LONG TERM GOAL #5   Title Pt will be Ind in a final HEP to maintain and progress achieved LOF    Status New    Target Date 06/26/20                 Plan - 06/01/20 1700    Clinical Impression Statement Pt applied KT tape on her own and it did not appear to be done in the best manner.  Aditional practice with supervision will be needed. Ionto was provided for the R knee for pain management. Strengthening was completed for the posterior chain of the shoulder and for the LEs.    Personal Factors and Comorbidities Age;Time since onset of injury/illness/exacerbation;Past/Current Experience    Examination-Activity Limitations Locomotion Level;Transfers;Reach Overhead;Sleep;Stairs;Stand;Lift    Stability/Clinical Decision Making Evolving/Moderate complexity    Clinical Decision Making Moderate    Rehab Potential Fair    PT Frequency 2x / week    PT Duration 8 weeks    PT Treatment/Interventions ADLs/Self Care Home Management;Cryotherapy;Electrical Stimulation;Iontophoresis 4mg /ml Dexamethasone;Moist Heat;Traction;Ultrasound;Therapeutic exercise;Therapeutic activities;Functional mobility training;Stair training;Gait training;Patient/family education;Energy conservation;Dry needling;Passive range of motion;Taping;Vasopneumatic Device;Spinal Manipulations;Joint Manipulations    PT Next Visit Plan Complete iontophoresis or continue KT taping to the L knee, Continuing strengthening exs for the L knee, abs, back  to promote function.    PT Home Exercise Plan [id number]  Consulted and Agree with Plan of Care Patient           Patient will benefit from skilled therapeutic intervention in order to improve the following deficits and impairments:  Abnormal gait, Decreased range of motion, Difficulty walking, Impaired UE functional use, Obesity, Decreased activity tolerance, Pain, Improper body mechanics, Postural dysfunction, Decreased strength, Decreased mobility  Visit Diagnosis: Chronic pain in right shoulder  Decreased ROM of left shoulder  Abnormal posture  Chronic pain of left knee  Decreased ROM of right shoulder  Chronic left shoulder pain     Problem List Patient Active Problem List   Diagnosis Date Noted  . Hypertensive disorder 05/05/2019   Gar Ponto MS, PT 06/01/20 10:33 PM  [facility] [address] [place], [place], [address] Phone: [phone]   Fax:  [phone]  Name: Denise Morales MRN: [id number] Date of Birth: 1960/08/27

## 2020-06-03 ENCOUNTER — Ambulatory Visit: Payer: Self-pay

## 2020-06-03 ENCOUNTER — Other Ambulatory Visit: Payer: Self-pay

## 2020-06-03 DIAGNOSIS — M25612 Stiffness of left shoulder, not elsewhere classified: Secondary | ICD-10-CM

## 2020-06-03 DIAGNOSIS — G8929 Other chronic pain: Secondary | ICD-10-CM

## 2020-06-03 DIAGNOSIS — R293 Abnormal posture: Secondary | ICD-10-CM

## 2020-06-03 DIAGNOSIS — M25512 Pain in left shoulder: Secondary | ICD-10-CM

## 2020-06-03 DIAGNOSIS — M25611 Stiffness of right shoulder, not elsewhere classified: Secondary | ICD-10-CM

## 2020-06-03 NOTE — Therapy (Signed)
[place], [place], [address] Phone: [phone]   Fax:  [phone]  Physical Therapy Treatment  Patient Details  Name: Denise Morales MRN: [id number] Date of Birth: 1960/11/05 No data recorded  Encounter Date: 06/03/2020   PT End of Session - 06/03/20 1251    Visit Number 9    Number of Visits 17    Date for PT Re-Evaluation 06/26/20    Authorization Type MEDICAID [place]-FAMILY PLANNING    Progress Note Due on Visit 10    PT Start Time 0918    PT Stop Time 1002    PT Time Calculation (min) 44 min    Activity Tolerance Patient tolerated treatment well    Behavior During Therapy [facility] for tasks assessed/performed           History reviewed. No pertinent past medical history.  Past Surgical History:  Procedure Laterality Date  . ABDOMINAL HYSTERECTOMY  1985    There were no vitals filed for this visit.   Subjective Assessment - 06/03/20 0922    Subjective Pt reports her L knee is feeling beter today, but it is not related to the ionto patch. Pt reports spasms in her low back c pain.    Currently in Pain? Yes    Pain Score 0-No pain    Pain Orientation Right;Left    Pain Descriptors / Indicators Aching;Burning;Sharp    Pain Type Chronic pain    Pain Onset More than a month ago    Pain Frequency Intermittent    Pain Score 0    Pain Location Back    Pain Orientation Posterior;Lower    Pain Descriptors / Indicators Aching    Pain Type Chronic pain    Pain Onset More than a month ago    Pain Frequency Intermittent    Pain Score 6    Pain Location Knee    Pain Orientation Right    Pain Descriptors / Indicators Aching;Jabbing;Sharp    Pain Type Chronic pain    Pain Onset More than a month ago    Pain Frequency Intermittent                             [facility] Adult PT Treatment/Exercise - 06/03/20 0001      Self-Care   Self-Care Other Self-Care Comments    Other Self-Care  Comments  Tennis ball massage to lumbar paraspinals; pt completed KT taping to her L knee c VC for best technique      Exercises   Exercises Knee/Hip;Lumbar      Lumbar Exercises: Stretches   Active Hamstring Stretch Right;Left;3 reps;20 seconds    Active Hamstring Stretch Limitations seated    Other Lumbar Stretch Exercise trunk flexion seated 3x, 10 sec      Lumbar Exercises: Seated   Other Seated Lumbar Exercises Ball press c abs engagaed; VC to sit tall      Lumbar Exercises: Supine   Pelvic Tilt 10 reps    Pelvic Tilt Limitations 3 sec    Other Supine Lumbar Exercises Mini-crunch 10x, reach hands toward ankles      Knee/Hip Exercises: Standing   Hip Flexion Left;10 reps;Knee straight    Hip Abduction Left;10 reps;Knee straight    Hip Extension Left;10 reps;Knee straight      Knee/Hip Exercises: Supine   Quad Sets Left;10 reps;AROM    Quad Sets Limitations 3 sec, with towel under L knee  Straight Leg Raises Left;10 reps    Straight Leg Raises Limitations full ROM    Other Supine Knee/Hip Exercises AROMHip abd/ad 10x    Other Supine Knee/Hip Exercises Glut sets 10x, 3 sec                  PT Education - 06/03/20 1250    Education Details Ed. for use of tennis ball for lumbar paraspinal massage, and ther ex for ball press c abs engagaed; VC to sit tall    Person(s) Educated Patient    Methods Explanation;Demonstration;Tactile cues;Verbal cues    Comprehension Verbalized understanding;Returned demonstration;Verbal cues required;Tactile cues required            PT Short Term Goals - 05/18/20 1129      PT SHORT TERM GOAL #1   Title Pt will be Ind in an initial HEP.    Status Achieved    Target Date 05/18/20      PT SHORT TERM GOAL #2   Title Pt will voice understanding of measure to reduce and manage her pain.    Status Achieved    Target Date 05/18/20             PT Long Term Goals - 04/24/20 1048      PT LONG TERM GOAL #1   Title Increase B  shoulder flexion ROM to 120d to promote functional use of UEs    Baseline 100d    Target Date 06/26/20      PT LONG TERM GOAL #2   Title Pt will report a decrease in B shoulder pain to a range of 2-7/10 with daily activities for improved functional use    Baseline 5-10/10    Status New    Target Date 06/26/20      PT LONG TERM GOAL #3   Title Pt will report tolerating moderate activity in her home ie vacuuming and carrying groceries with moderate difficulty    Baseline marked difficulty    Status New    Target Date 06/26/20      PT LONG TERM GOAL #4   Title Pt's FOTO score for fxal ability will improve to 43% demonstrating improved fxal ability    Baseline 32%    Status New    Target Date 06/26/20      PT LONG TERM GOAL #5   Title Pt will be Ind in a final HEP to maintain and progress achieved LOF    Status New    Target Date 06/26/20                 Plan - 06/03/20 1252    Clinical Impression Statement Pt completed KT taping for her L knee c min VC for best technique. Pt reports the KT taping helps her knees the most. Pt's shoulders and back pain is intermittent depending her activity, generally with her pain wrosening  throughout the day.    Personal Factors and Comorbidities Age;Time since onset of injury/illness/exacerbation;Past/Current Experience    Examination-Activity Limitations Locomotion Level;Transfers;Reach Overhead;Sleep;Stairs;Stand;Lift    Stability/Clinical Decision Making Evolving/Moderate complexity    Clinical Decision Making Moderate    PT Frequency 2x / week    PT Duration 8 weeks    PT Treatment/Interventions ADLs/Self Care Home Management;Cryotherapy;Electrical Stimulation;Iontophoresis 4mg /ml Dexamethasone;Moist Heat;Traction;Ultrasound;Therapeutic exercise;Therapeutic activities;Functional mobility training;Stair training;Gait training;Patient/family education;Energy conservation;Dry needling;Passive range of motion;Taping;Vasopneumatic  Device;Spinal Manipulations;Joint Manipulations    PT Next Visit Plan Assess pt's ability to complete KT taping, review HEP, possible DC  PT Home Exercise Plan [id number]    Consulted and Agree with Plan of Care Patient           Patient will benefit from skilled therapeutic intervention in order to improve the following deficits and impairments:  Abnormal gait,Decreased range of motion,Difficulty walking,Impaired UE functional use,Obesity,Decreased activity tolerance,Pain,Improper body mechanics,Postural dysfunction,Decreased strength,Decreased mobility  Visit Diagnosis: Chronic pain in right shoulder  Decreased ROM of left shoulder  Abnormal posture  Chronic pain of left knee  Decreased ROM of right shoulder  Chronic left shoulder pain     Problem List Patient Active Problem List   Diagnosis Date Noted  . Hypertensive disorder 05/05/2019    Gar Ponto MS, PT 06/03/20 1:14 PM  [place] [facility] [address] [place], [place], [address] Phone: [phone]   Fax:  [phone]  Name: Denise Morales MRN: [id number] Date of Birth: [birth year]

## 2020-06-08 ENCOUNTER — Ambulatory Visit: Payer: Self-pay

## 2020-06-08 ENCOUNTER — Other Ambulatory Visit: Payer: Self-pay

## 2020-06-08 DIAGNOSIS — M25511 Pain in right shoulder: Secondary | ICD-10-CM

## 2020-06-08 DIAGNOSIS — R293 Abnormal posture: Secondary | ICD-10-CM

## 2020-06-08 DIAGNOSIS — G8929 Other chronic pain: Secondary | ICD-10-CM

## 2020-06-08 DIAGNOSIS — M25512 Pain in left shoulder: Secondary | ICD-10-CM

## 2020-06-08 DIAGNOSIS — M25612 Stiffness of left shoulder, not elsewhere classified: Secondary | ICD-10-CM

## 2020-06-08 DIAGNOSIS — M25562 Pain in left knee: Secondary | ICD-10-CM

## 2020-06-08 DIAGNOSIS — M25611 Stiffness of right shoulder, not elsewhere classified: Secondary | ICD-10-CM

## 2020-06-08 NOTE — Therapy (Addendum)
[place], [place], [address] Phone: [phone]   Fax:  [phone]  Physical Therapy Treatment/Progress Note/DIscharge   Progress Note Reporting Period 04/23/20 to 06/08/20  See note below for Objective Data and Assessment of Progress/Goals.       Patient Details  Name: Denise Morales MRN: [id number] Date of Birth: 06-Jun-1961 No data recorded  Encounter Date: 06/08/2020   PT End of Session - 06/08/20 1106    Visit Number 10    Number of Visits 17    Date for PT Re-Evaluation 06/26/20    Authorization Type MEDICAID [place]-FAMILY PLANNING    PT Start Time 0918    PT Stop Time 1001    PT Time Calculation (min) 43 min    Activity Tolerance Patient tolerated treatment well    Behavior During Therapy [facility] for tasks assessed/performed           History reviewed. No pertinent past medical history.  Past Surgical History:  Procedure Laterality Date  . ABDOMINAL HYSTERECTOMY  1985    There were no vitals filed for this visit.   Subjective Assessment - 06/08/20 0924    Subjective Pt reports she took her grandkids to a parade on Saturday and all areas are bothering her. Pt reports her self-application of the KT tape has been helpful and she uderstands the process for applying. Overall, pain is significant after increased activity or at the end of the day. PT HEP can bee helpful in reducting pain. KT taping has been the msot helpful.    Limitations Lifting;Standing;Walking;House hold activities    Currently in Pain? Yes    Pain Score 6     Pain Location Shoulder    Pain Orientation Right;Left    Pain Descriptors / Indicators Aching;Burning;Sharp    Pain Type Chronic pain    Pain Radiating Towards Lateral R arm    Pain Onset More than a month ago    Pain Frequency Intermittent    Aggravating Factors  use of arms    Pain Relieving Factors Rest    Effect of Pain on Daily Activities Significant     Pain Score 5    Pain Location Back    Pain Orientation Posterior;Lower    Pain Descriptors / Indicators Aching    Pain Type Chronic pain    Pain Onset More than a month ago    Pain Frequency Intermittent    Aggravating Factors  prolonged standing and walking    Pain Relieving Factors rest, exs    Effect of Pain on Daily Activities Significant    Pain Score 6    Pain Location Knee    Pain Orientation Right    Pain Descriptors / Indicators Aching;Sharp;Jabbing    Pain Type Chronic pain    Pain Onset More than a month ago    Pain Frequency Intermittent    Aggravating Factors  WBIng-standing and walking    Pain Relieving Factors rest, KT taping    Effect of Pain on Daily Activities Significant                             [facility] Adult PT Treatment/Exercise - 06/08/20 0001      Exercises   Exercises Knee/Hip;Lumbar      Lumbar Exercises: Stretches   Active Hamstring Stretch Right;Left;3 reps;20 seconds    Active Hamstring Stretch Limitations seated    Other Lumbar Stretch Exercise trunk flexion seated  3x, 10 sec    Other Lumbar Stretch Exercise --      Lumbar Exercises: Seated   Other Seated Lumbar Exercises Ball press c abs engagaed; VC to sit tall      Lumbar Exercises: Supine   Pelvic Tilt 10 reps    Pelvic Tilt Limitations 3 sec      Knee/Hip Exercises: Seated   Other Seated Knee/Hip Exercises B hip add sets 15x      Knee/Hip Exercises: Supine   Quad Sets Left;10 reps;AROM    Quad Sets Limitations 3 sec, with towel under L knee    Straight Leg Raises Left;10 reps    Straight Leg Raises Limitations full ROM    Other Supine Knee/Hip Exercises AROMHip abd/ad 10x    Other Supine Knee/Hip Exercises Glut sets 10x, 3 sec      Shoulder Exercises: Seated   Extension Both;15 reps    Theraband Level (Shoulder Extension) Level 2 (Red)    Row Both;15 reps    Theraband Level (Shoulder Row) Level 2 (Red)                  PT Education - 06/08/20 0922     Education Details Pt has a final HEP    Person(s) Educated Patient    Methods Explanation    Comprehension Returned demonstration            PT Short Term Goals - 05/18/20 1129      PT SHORT TERM GOAL #1   Title Pt will be Ind in an initial HEP.    Status Achieved    Target Date 05/18/20      PT SHORT TERM GOAL #2   Title Pt will voice understanding of measure to reduce and manage her pain.    Status Achieved    Target Date 05/18/20             PT Long Term Goals - 06/08/20 1114      PT LONG TERM GOAL #1   Title Increase B shoulder flexion ROM to 120d to promote functional use of UEs. 06/08/20: not met- R 105d, L 95d    Status Not Met      PT LONG TERM GOAL #2   Title Pt will report a decrease in B shoulder pain to a range of 2-7/10 with daily activities for improved functional use. 06/06/20 Not met-0-7/10    Baseline 5-10/10    Status Not Met      PT LONG TERM GOAL #4   Title Pt's FOTO score for fxal ability will improve to 43% demonstrating improved fxal ability.06/08/20: Not met- 30%    Status Not Met      PT LONG TERM GOAL #5   Title Pt will be Ind in a final HEP to maintain and progress achieved LOF. 06/08/20: Met    Status Achieved                 Plan - 06/08/20 0953    Clinical Impression Statement Pt is ind in a HEP for strengthening and function is Ind c completing KT taping which has been beneficial in decreasing her L knee pain. Pt still experiences significant levels of pain with icreased activity and ar the end of the day. With rest, or at the start of the day, her pain is 0/10 or low. Pt's FOTO incidates no change with pain's impact on her functional activities. Pt is to undergo MRIs on all areas on 12/20 and is  to return to PT in 3-4 weeks for re-assessment or care as indicated.    Personal Factors and Comorbidities Age;Time since onset of injury/illness/exacerbation;Past/Current Experience    Examination-Activity Limitations Locomotion  Level;Transfers;Reach Overhead;Sleep;Stairs;Stand;Lift    Stability/Clinical Decision Making Evolving/Moderate complexity    Clinical Decision Making Moderate    Rehab Potential Fair    PT Frequency 2x / week    PT Duration 8 weeks    PT Treatment/Interventions ADLs/Self Care Home Management;Cryotherapy;Electrical Stimulation;Iontophoresis 30m/ml Dexamethasone;Moist Heat;Traction;Ultrasound;Therapeutic exercise;Therapeutic activities;Functional mobility training;Stair training;Gait training;Patient/family education;Energy conservation;Dry needling;Passive range of motion;Taping;Vasopneumatic Device;Spinal Manipulations;Joint Manipulations    PT Home Exercise Plan [id number]   Consulted and Agree with Plan of Care Patient           Patient will benefit from skilled therapeutic intervention in order to improve the following deficits and impairments:  Abnormal gait,Decreased range of motion,Difficulty walking,Impaired UE functional use,Obesity,Decreased activity tolerance,Pain,Improper body mechanics,Postural dysfunction,Decreased strength,Decreased mobility  Visit Diagnosis: Chronic pain in right shoulder  Decreased ROM of left shoulder  Abnormal posture  Chronic pain of left knee  Decreased ROM of right shoulder  Chronic left shoulder pain     Problem List Patient Active Problem List   Diagnosis Date Noted  . Hypertensive disorder 05/05/2019    AGar PontoMS, PT 06/08/20 11:21 AM   PHYSICAL THERAPY DISCHARGE SUMMARY  Visits from Start of Care: 10  Current functional level related to goals / functional outcomes: See above   Remaining deficits: See above - Assisted pt in managing knee pain. Goals for for shoulder pain were not met.  Education / Equipment: HEP. KT taping for knees Plan: Patient agrees to discharge.  Patient goals were partially met. Patient is being discharged due to not returning since the last visit.  ?????        Pt is to undergo surgery  for her R shoulder  AGar PontoMS, PT 07/28/20 2:18 PM   [place] [address]Phone: [phone]  Fax:  [phone] Name: Denise PohlmanMRN: [id number] of Birth: 114-Jun-1962

## 2020-06-10 ENCOUNTER — Ambulatory Visit: Payer: Self-pay

## 2020-06-14 ENCOUNTER — Ambulatory Visit
Admission: RE | Admit: 2020-06-14 | Discharge: 2020-06-14 | Disposition: A | Discharge status: Home / Self Care | Payer: Medicaid Other | Source: Ambulatory Visit | Attending: Family Medicine | Admitting: Family Medicine

## 2020-06-14 ENCOUNTER — Other Ambulatory Visit: Payer: Self-pay

## 2020-06-14 DIAGNOSIS — G8929 Other chronic pain: Secondary | ICD-10-CM

## 2020-06-14 DIAGNOSIS — M25562 Pain in left knee: Secondary | ICD-10-CM

## 2020-06-14 IMAGING — MR MR SHOULDER*R* W/O CM
4 of 5 series · 29 of 40 positions shown · non-contrast
Comparison: None.

CLINICAL DATA: Right shoulder pain and weakness for 2 years.

EXAM:
MRI OF THE RIGHT SHOULDER WITHOUT CONTRAST
TECHNIQUE: Multiplanar, multisequence MR imaging of the shoulder was performed.
No intravenous contrast was administered.

[Series 3: T2 fat-sat · axial · 4.0mm · 0.27mm/px · z∈[-59,+86]mm · 8 of 30 slices shown (1 of 3)]
[im 1/30]
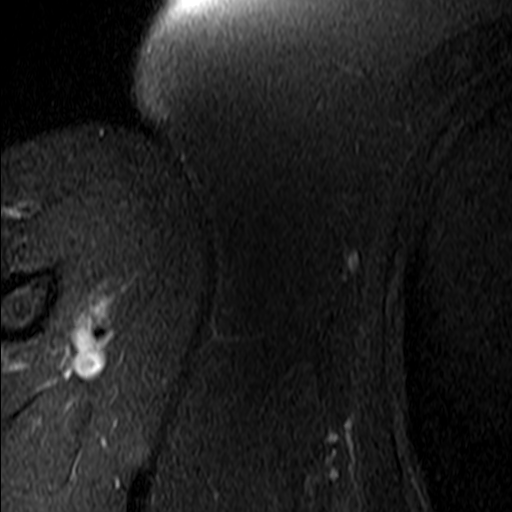
[im 4/30]
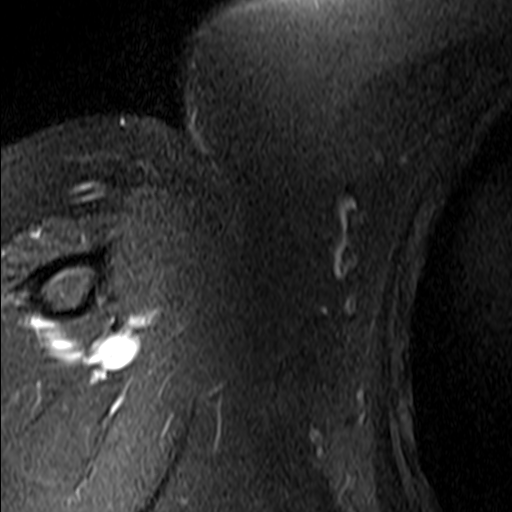
[im 10/30]
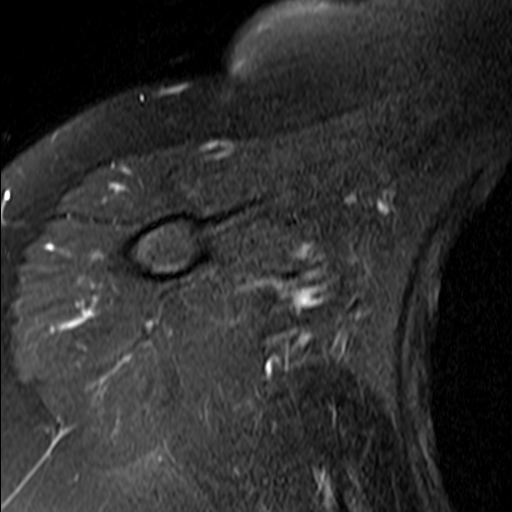
[im 13/30]
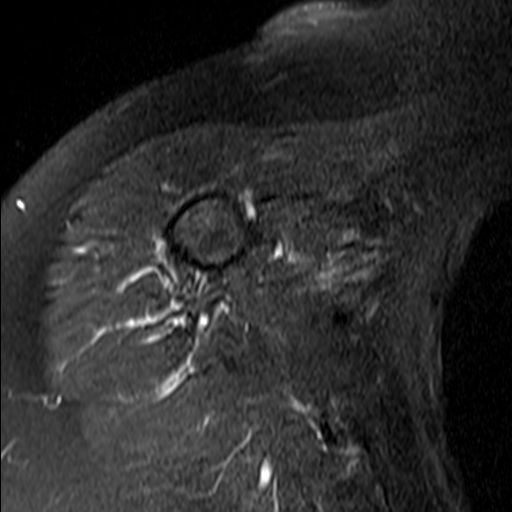
[im 17/30]
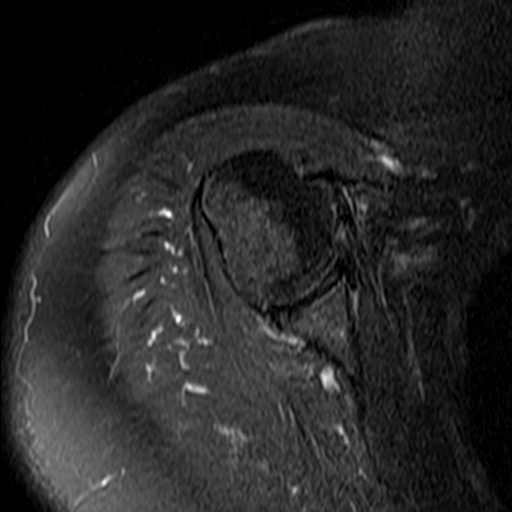
[im 20/30]
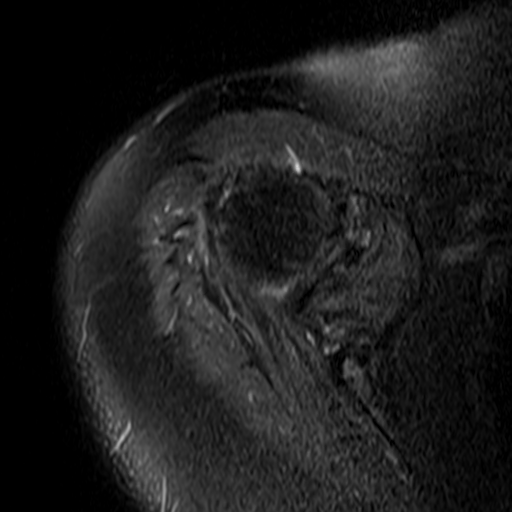
[im 26/30]
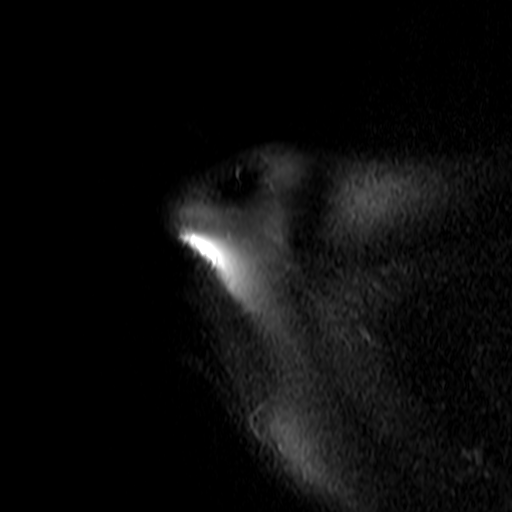
[im 30/30]
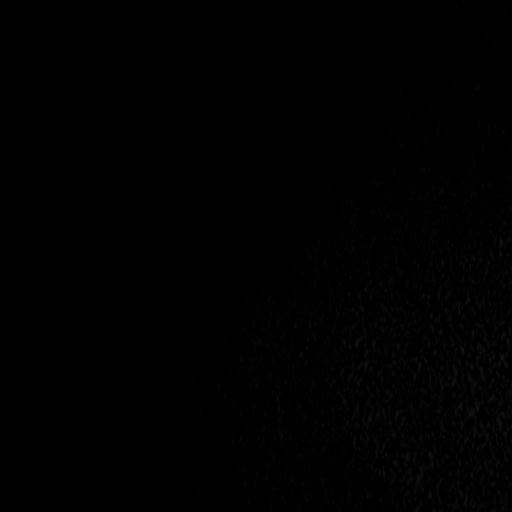

[Series 4: PD · oblique · 4.0mm · 0.29mm/px · 7 of 21 slices shown]
[im 1/21]
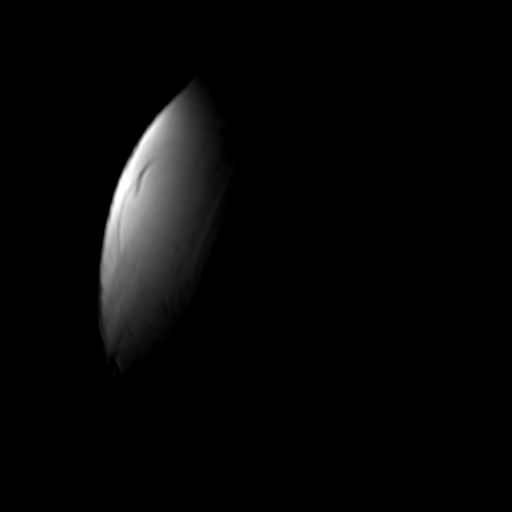
[im 4/21]
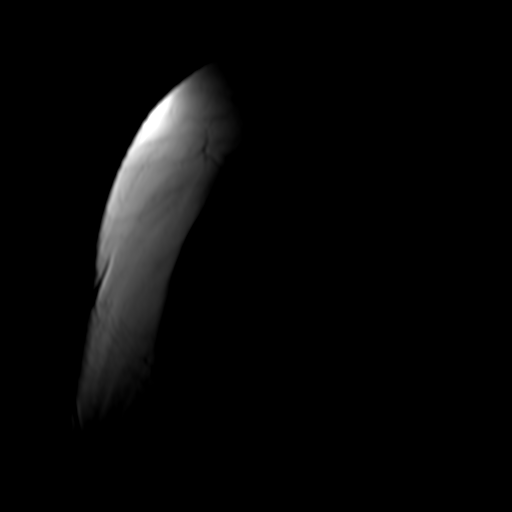
[im 7/21]
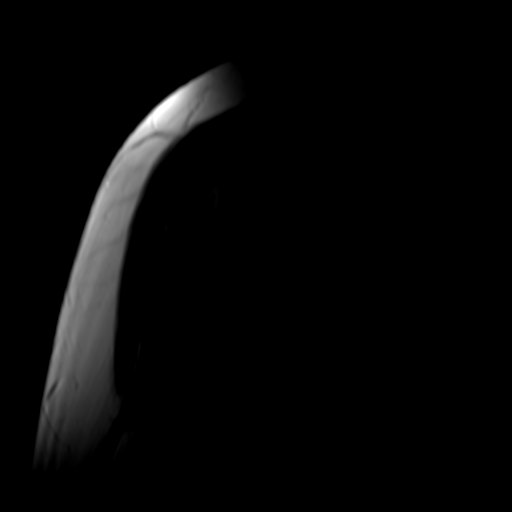
[im 11/21]
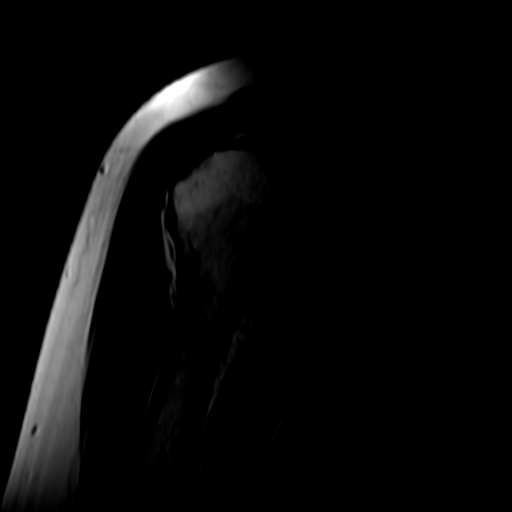
[im 14/21]
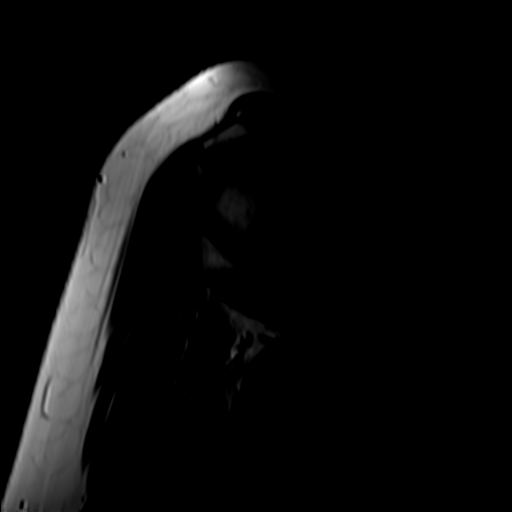
[im 17/21]
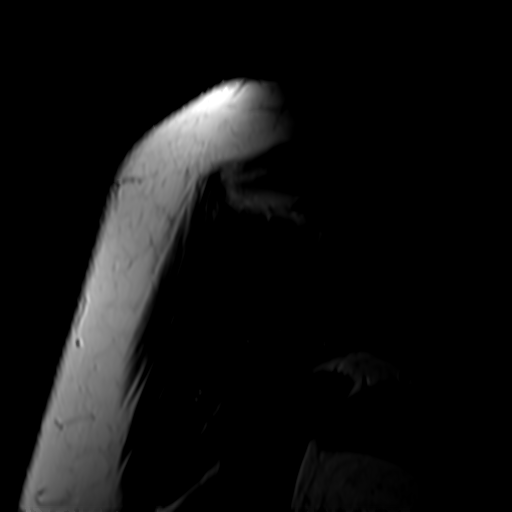
[im 21/21]
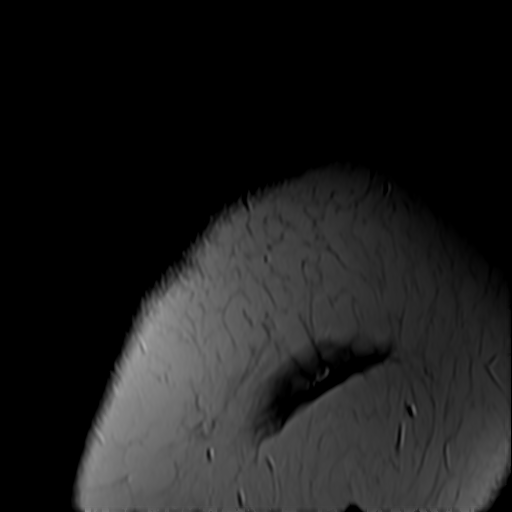

[Series 5: T2 fat-sat · oblique · 4.0mm · 0.59mm/px · 7 of 21 slices shown (2 of 3)]
[im 1/21]
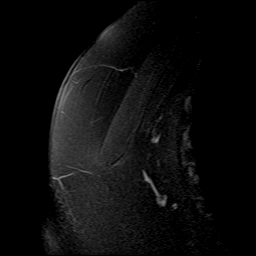
[im 4/21]
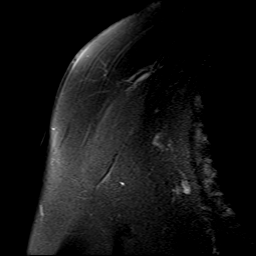
[im 7/21]
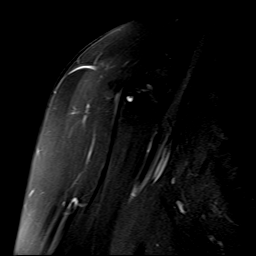
[im 11/21]
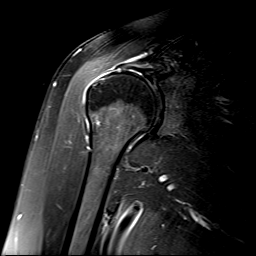
[im 14/21]
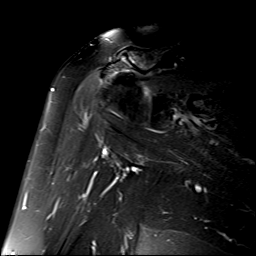
[im 17/21]
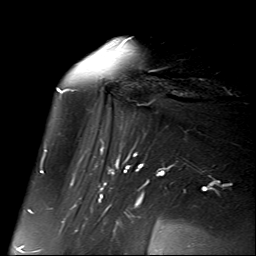
[im 21/21]
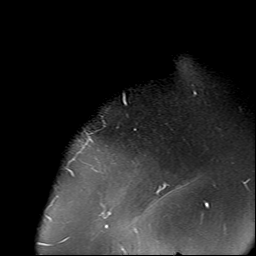

[Series 6: T2 fat-sat · oblique · 4.0mm · 0.59mm/px · 7 of 22 slices shown (3 of 3)]
[im 1/22]
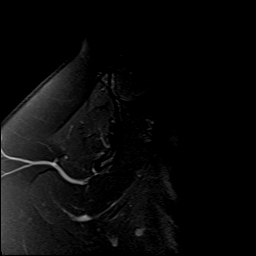
[im 4/22]
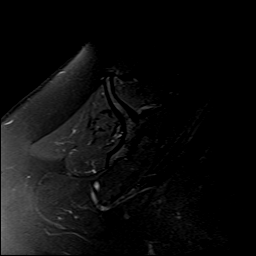
[im 7/22]
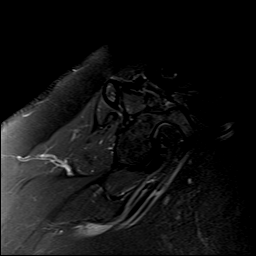
[im 10/22]
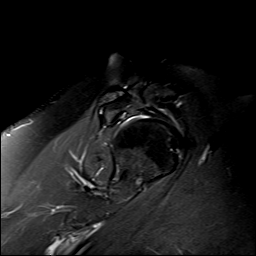
[im 13/22]
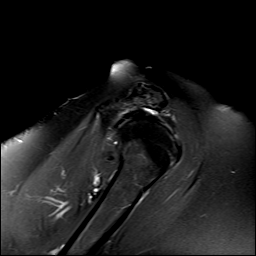
[im 16/22]
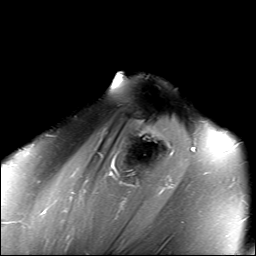
[im 19/22]
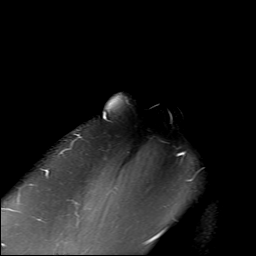

[29 of 40 positions shown; findings below may reference images not displayed]

FINDINGS: Rotator cuff: Full-thickness full width rupture of the supraspinatus
tendon retracted back 1.6 cm, without muscular atrophy or edema.

Intrasubstance partial thickness tearing of the infraspinatus tendon
along the myotendinous junction, image 11 series 6.

Mild subscapularis tendinopathy.

Muscles:  No muscular edema or atrophy.

Biceps long head:  Unremarkable

Acromioclavicular Joint: Mild degenerative AC joint spurring. Type I
acromion. The small amount of fluid in the subacromial subdeltoid
bursa communicating with the glenohumeral joint.

Glenohumeral Joint: No joint effusion. Mild degenerative chondral
thinning.

Labrum:  Grossly unremarkable

Bones: No significant extra-articular osseous abnormalities
identified.

Other: No supplemental non-categorized findings.
IMPRESSION: 1. Full-thickness full width rupture of the supraspinatus tendon
retracted back 1.6 cm. No muscular atrophy or edema.
2. Intrasubstance partial thickness tearing of the infraspinatus
tendon along the myotendinous junction.
3. Mild subscapularis tendinopathy.
4. Mild degenerative AC joint and glenohumeral joint arthropathy.

## 2020-06-14 IMAGING — MR MR KNEE*L* W/O CM
4 of 6 series · 24 of 40 positions shown · non-contrast
Comparison: None.

CLINICAL DATA: Chronic left knee pain and weakness. No known
injury.

EXAM:
MRI OF THE LEFT KNEE WITHOUT CONTRAST
TECHNIQUE: Multiplanar, multisequence MR imaging of the knee was performed. No
intravenous contrast was administered.

[Series 5: T2 fat-sat · axial · 4.0mm · 0.62mm/px · z∈[-111,+4]mm · 7 of 24 slices shown (1 of 2)]
[im 1/24]
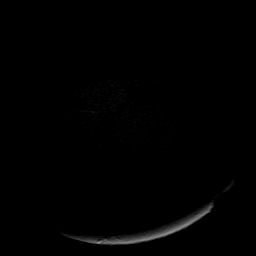
[im 4/24]
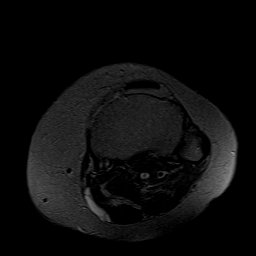
[im 8/24]
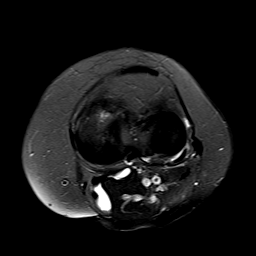
[im 12/24]
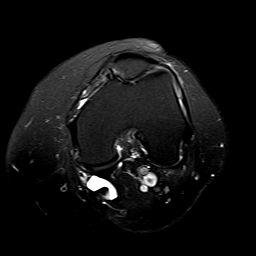
[im 16/24]
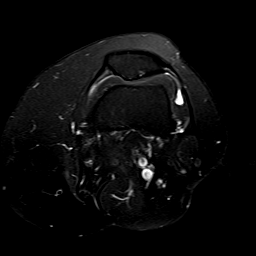
[im 20/24]
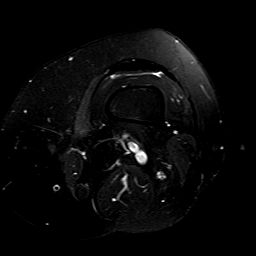
[im 24/24]
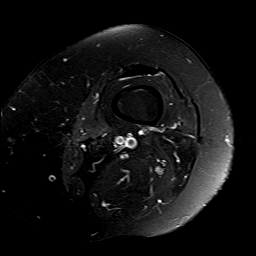

[Series 7: T2 fat-sat · coronal · 4.0mm · 0.29mm/px · 4 of 24 slices shown (2 of 2)]
[im 1/24]
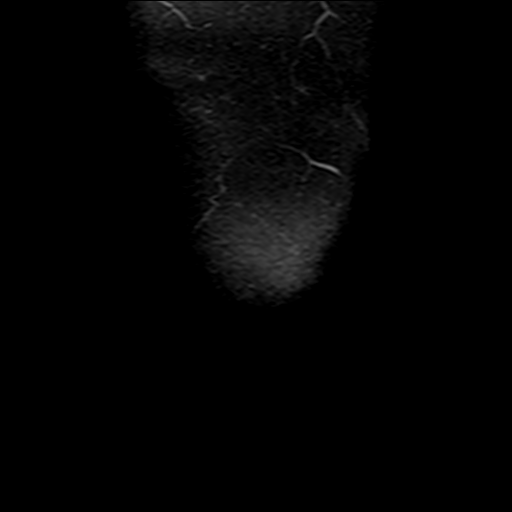
[im 5/24]
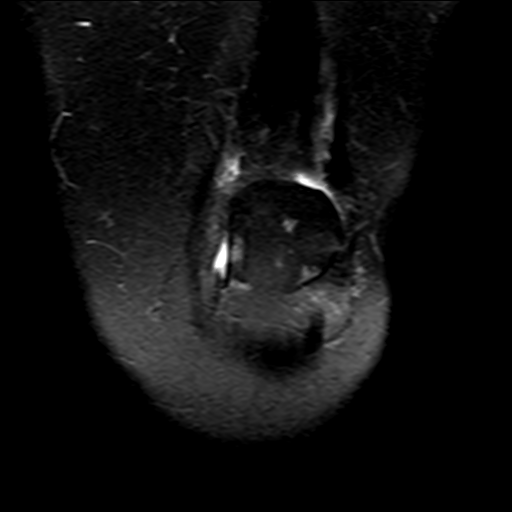
[im 14/24]
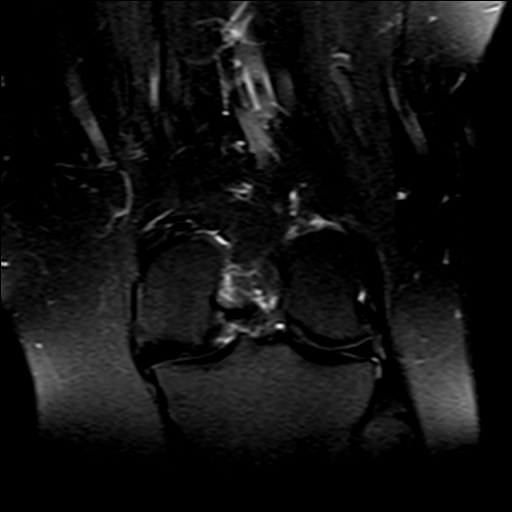
[im 24/24]
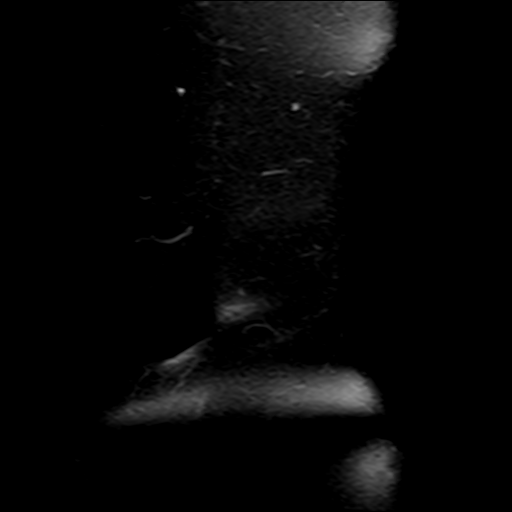

[Series 8: PD fat-sat · coronal · 4.0mm · 0.29mm/px · 6 of 24 slices shown (1 of 2)]
[im 1/24]
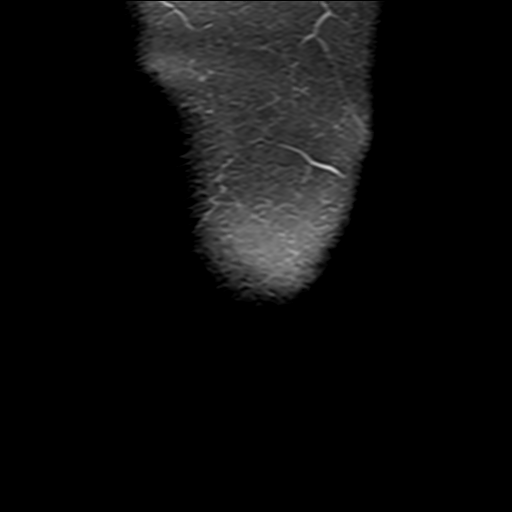
[im 5/24]
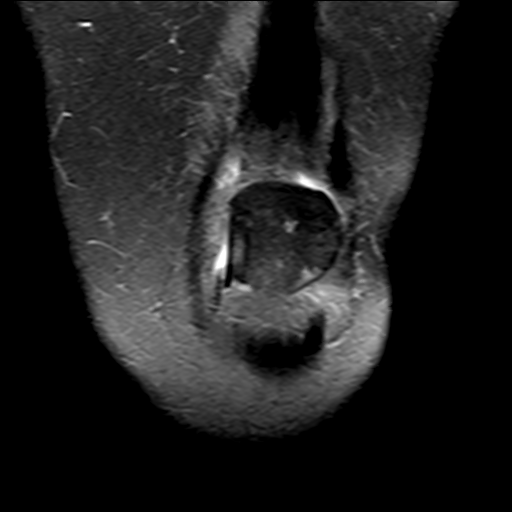
[im 10/24]
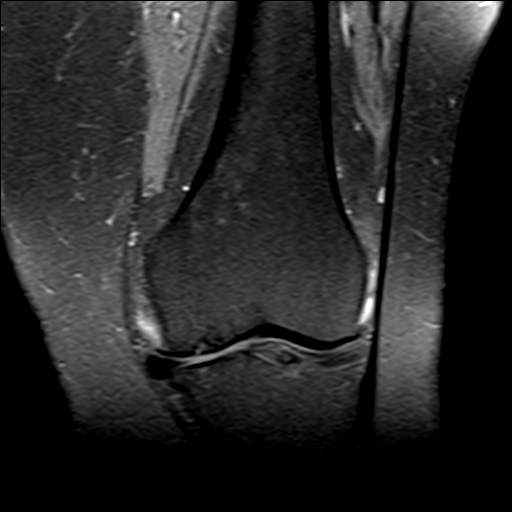
[im 14/24]
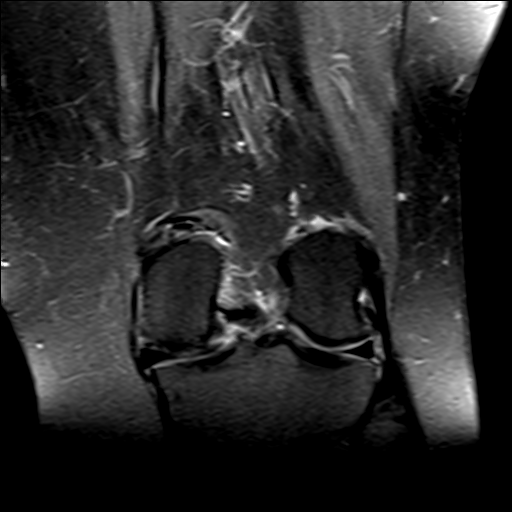
[im 19/24]
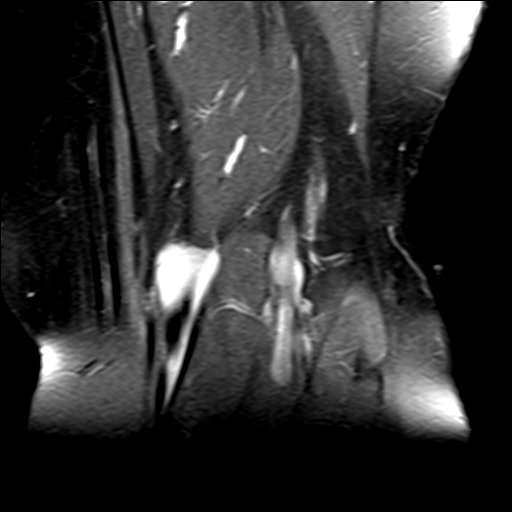
[im 24/24]
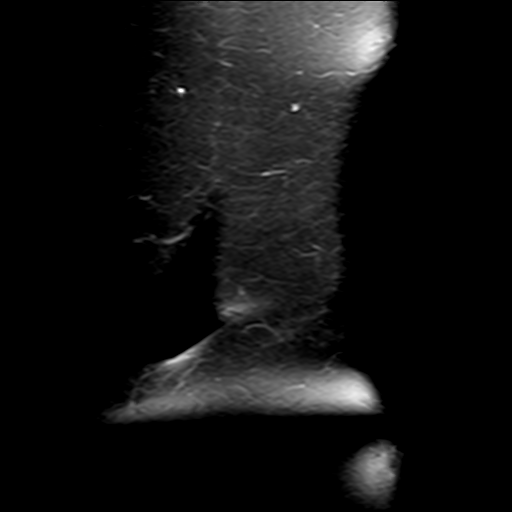

[Series 10: PD fat-sat · sagittal · 3.0mm · 0.29mm/px · 7 of 27 slices shown (2 of 2)]
[im 1/27]
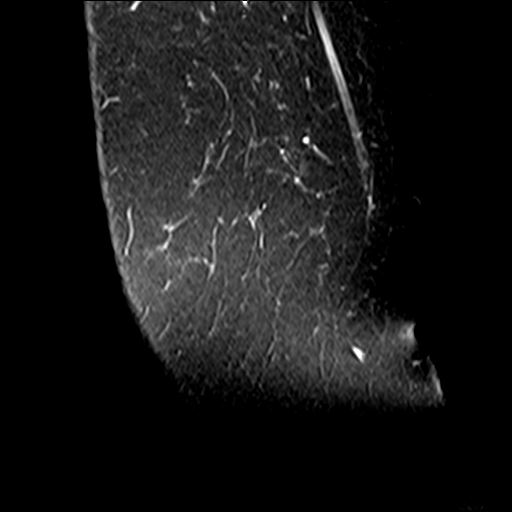
[im 5/27]
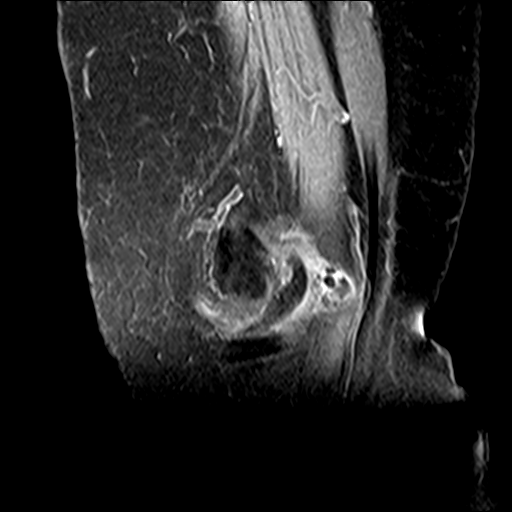
[im 9/27]
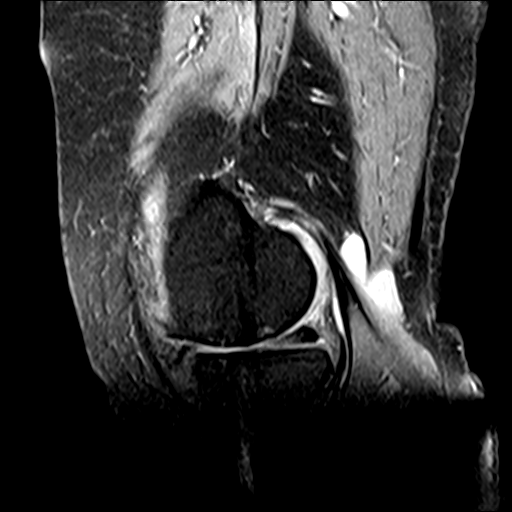
[im 14/27]
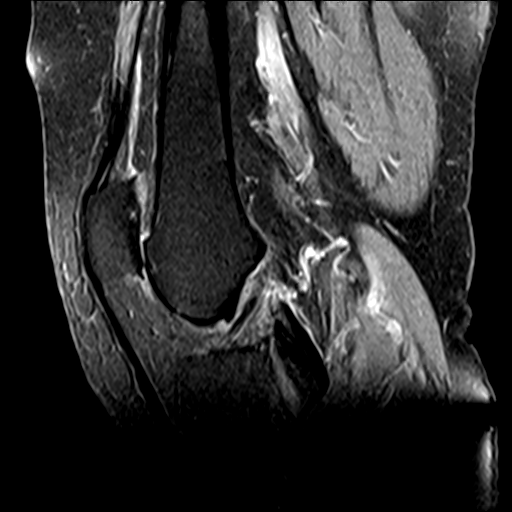
[im 18/27]
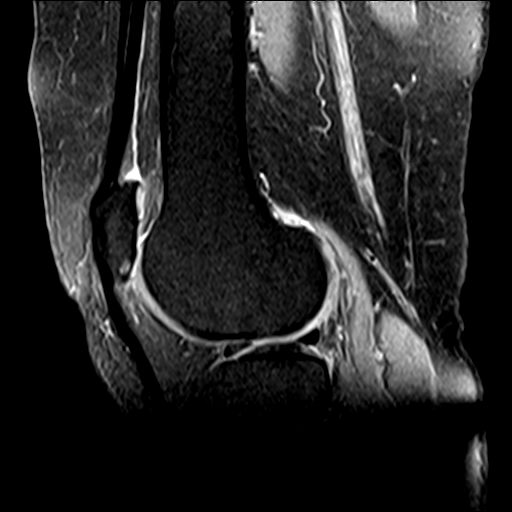
[im 22/27]
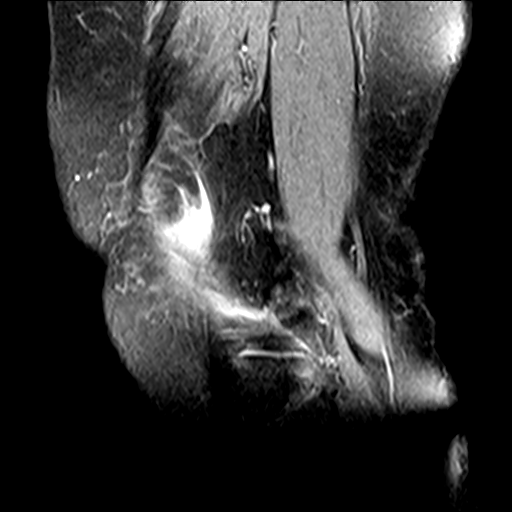
[im 27/27]
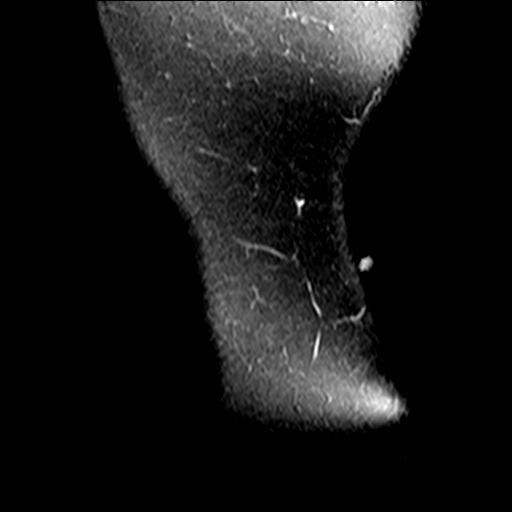

[24 of 40 positions shown; findings below may reference images not displayed]

FINDINGS: MENISCI

Medial meniscus: There is fraying along the free edge of the
posterior horn.

Lateral meniscus:  Intact.

LIGAMENTS

Cruciates:  Intact.

Collaterals:  Intact.

CARTILAGE

Patellofemoral:  Severely degenerated.

Medial:  Severely degenerated.

Lateral:  Mildly degenerated

Joint:  Small joint effusion.

Popliteal Fossa: Baker's cyst measures approximately 2.7 cm AP x
cm transverse x 5.6 cm craniocaudal. A few small loose bodies are
seen within the cyst.

Extensor Mechanism:  Intact.

Bones: No fracture or worrisome lesion. Tricompartmental
osteophytosis is present. Small subchondral cysts are seen in the
medial femoral condyle and patella.

Other: None.
IMPRESSION: Dominant finding is tricompartmental osteoarthritis which appears
severe in the patellofemoral and medial compartments.

Fraying along the free edge of the posterior horn of the medial
meniscus without tear.

Baker's cyst contains loose bodies.

## 2020-06-14 IMAGING — MR MR LUMBAR SPINE W/O CM
4 of 5 series · 28 of 48 positions shown · non-contrast
Comparison: None.

CLINICAL DATA: Low back pain.

EXAM:
MRI LUMBAR SPINE WITHOUT CONTRAST
TECHNIQUE: Multiplanar, multisequence MR imaging of the lumbar spine was
performed. No intravenous contrast was administered.

[Series 5: T2 · sagittal · 4.0mm · 1.09mm/px · 7 of 19 slices shown (1 of 2)]
[im 1/19]
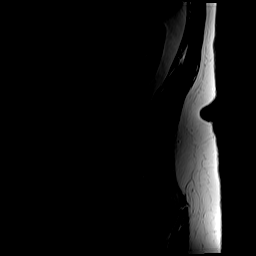
[im 4/19]
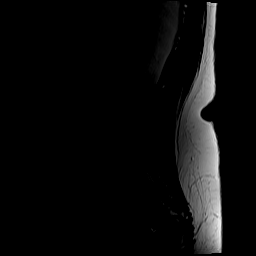
[im 7/19]
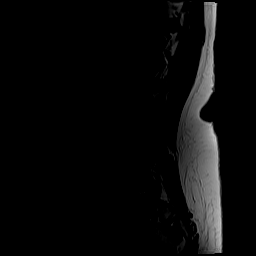
[im 10/19]
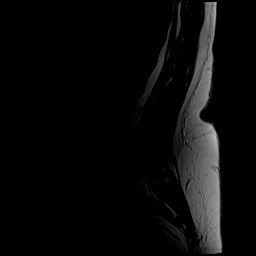
[im 13/19]
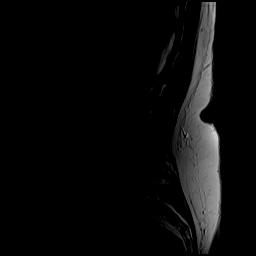
[im 16/19]
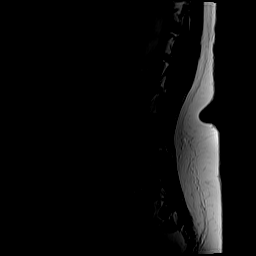
[im 19/19]
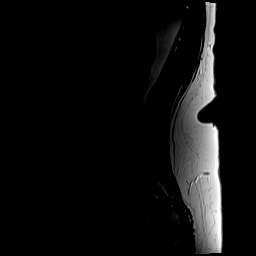

[Series 7: T1 · sagittal · 4.0mm · 1.09mm/px · 6 of 19 slices shown (1 of 2)]
[im 1/19]
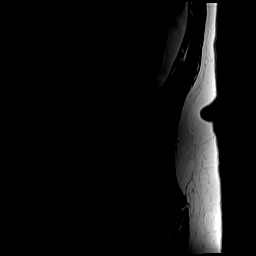
[im 4/19]
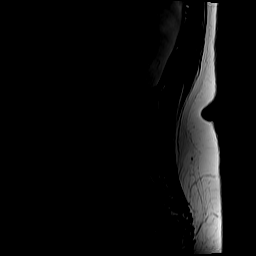
[im 8/19]
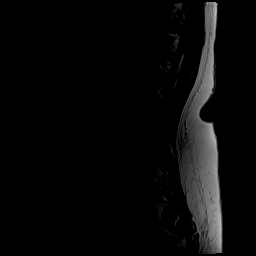
[im 11/19]
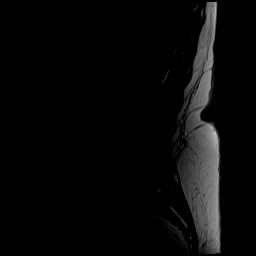
[im 15/19]
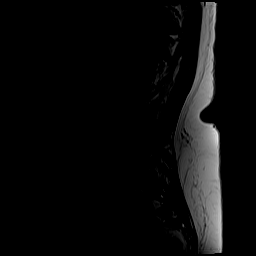
[im 19/19]
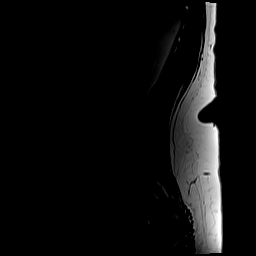

[Series 8: T2 · axial · 4.0mm · 0.39mm/px · z∈[-75,+131]mm · 8 of 42 slices shown (2 of 2)]
[im 1/42]
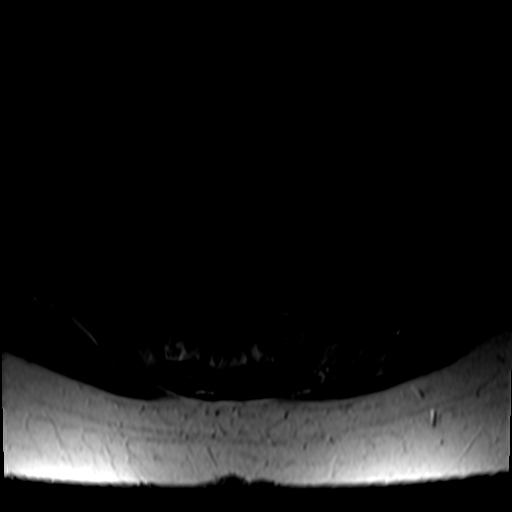
[im 7/42]
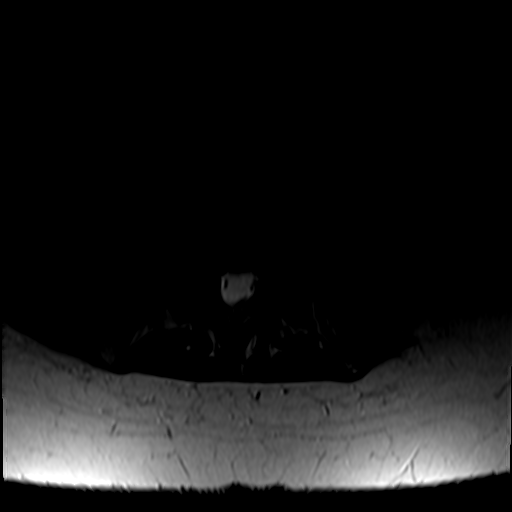
[im 13/42]
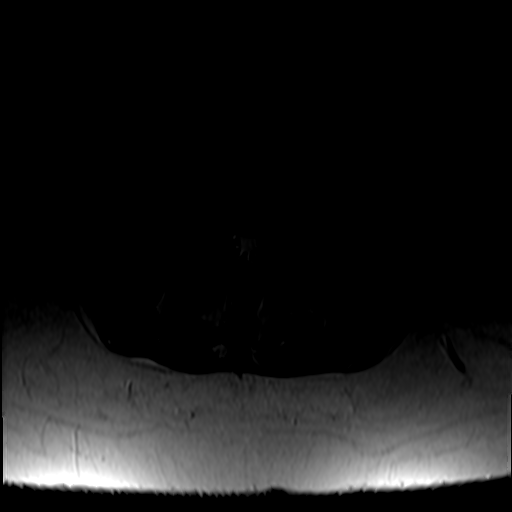
[im 19/42]
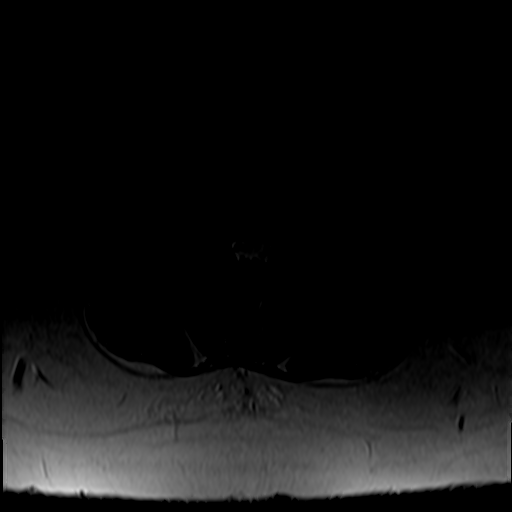
[im 23/42]
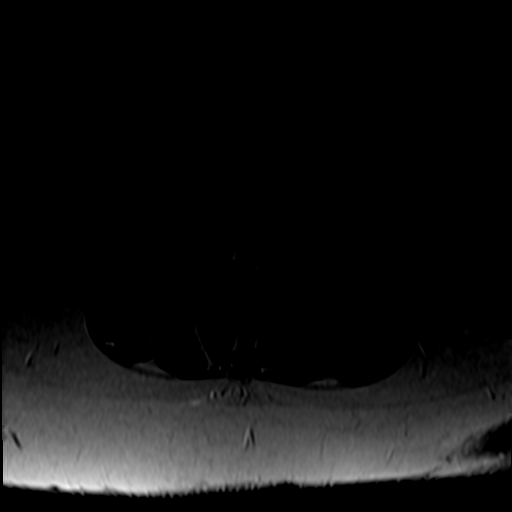
[im 29/42]
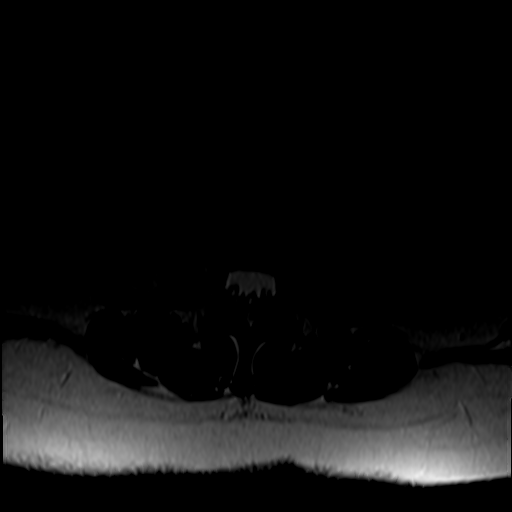
[im 35/42]
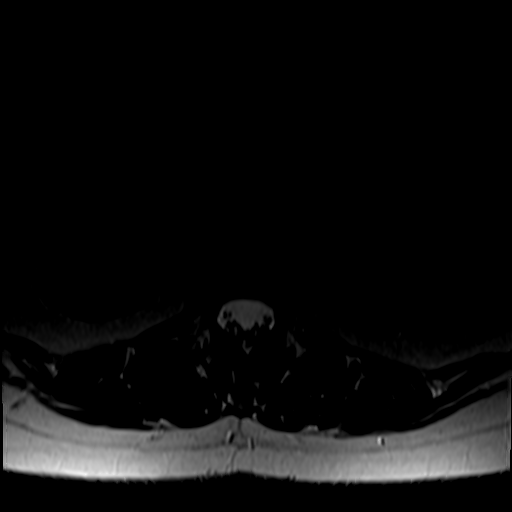
[im 42/42]
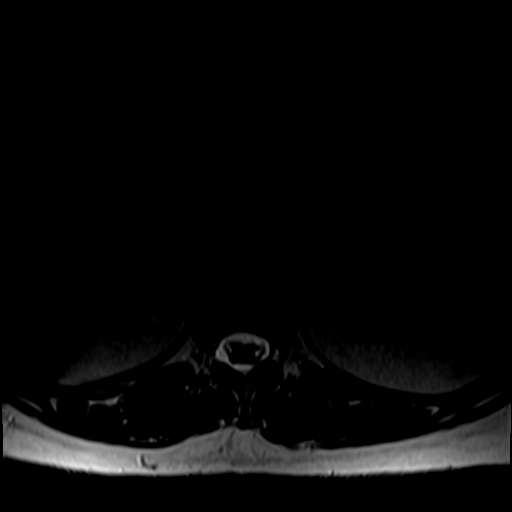

[Series 9: T1 · axial · 4.0mm · 0.39mm/px · z∈[-75,+97]mm · 7 of 42 slices shown (2 of 2)]
[im 1/42]
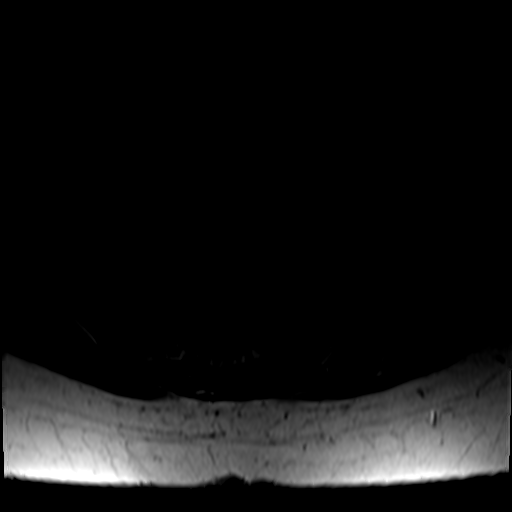
[im 7/42]
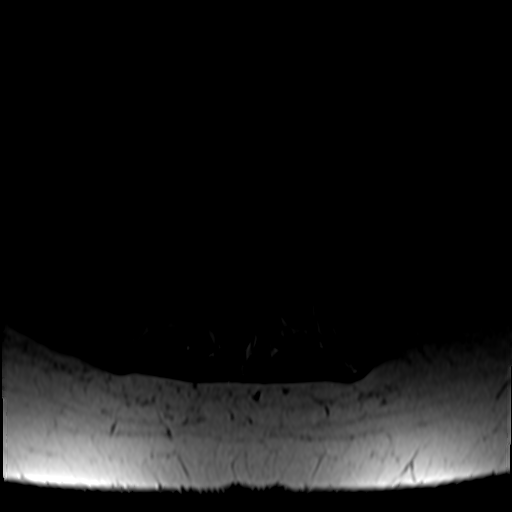
[im 13/42]
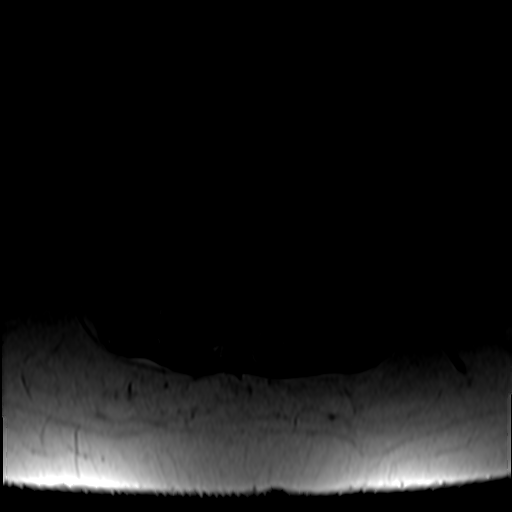
[im 19/42]
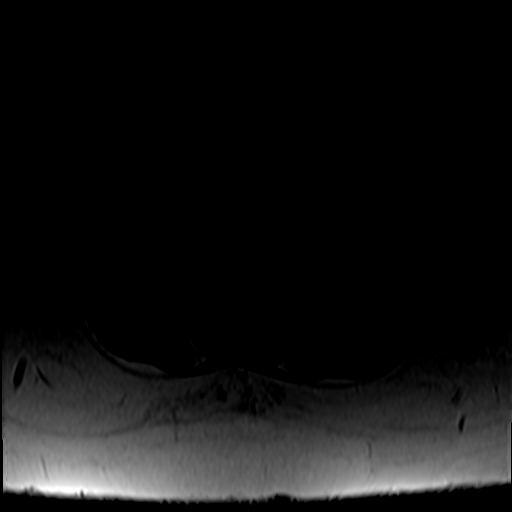
[im 23/42]
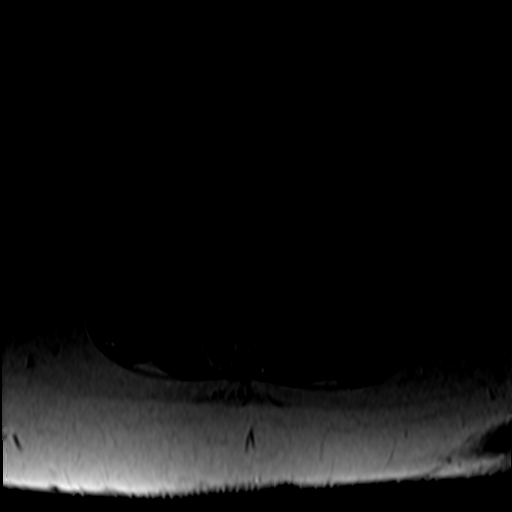
[im 29/42]
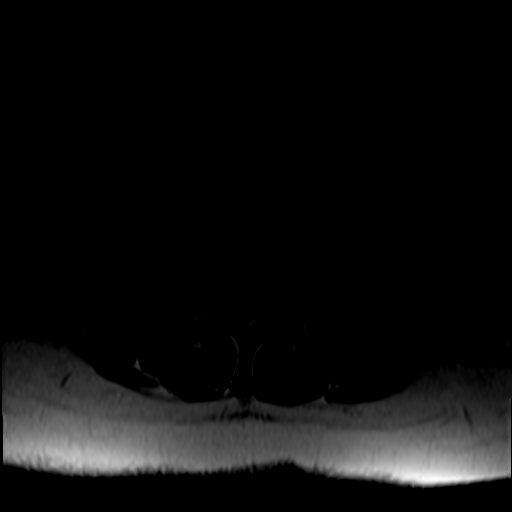
[im 35/42]
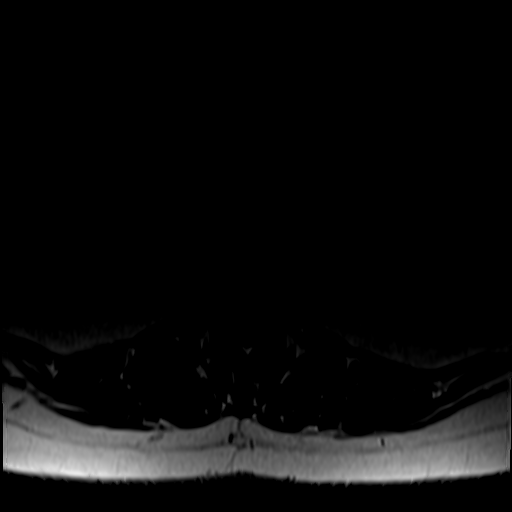

[28 of 48 positions shown; findings below may reference images not displayed]

FINDINGS: Segmentation:  Standard.

Alignment:  Physiologic.

Vertebrae:  No fracture, evidence of discitis, or bone lesion.

Conus medullaris and cauda equina: Conus extends to the T12 level.
Conus and cauda equina appear normal.

Paraspinal and other soft tissues: Negative.

Disc levels:

T12-L1: No spinal canal or neural foraminal stenosis.

L1-2: Shallow disc bulge. No spinal canal or neural foraminal
stenosis.

L2-3: Shallow disc bulge and mild facet degenerative changes. No
spinal canal neural foraminal stenosis.

L3-4: Shallow disc bulge and mild facet degenerative changes. No
significant spinal canal neural foraminal stenosis.

L4-5: Shallow disc bulge, advanced facet degenerative changes
ligamentum flavum redundancy without significant spinal canal or
neural foraminal stenosis.

L5-S1: Advanced hypertrophic facet degenerative changes, more
pronounced on the right side without significant spinal canal or
neural foraminal stenosis.
IMPRESSION: 1. Advanced hypertrophic facet degenerative changes at the L4-5 and
L5-S1 levels, which may be a source of low back pain.
2. Mild multilevel degenerative disc disease without significant
spinal canal or neural foraminal stenosis at any level.

## 2020-06-15 ENCOUNTER — Telehealth: Payer: Self-pay | Admitting: Family Medicine

## 2020-06-15 NOTE — Telephone Encounter (Signed)
Lumbar MRI scan shows arthritis at the facet joints of L4-5 and L5-S1.  No nerve impingement, no indication for surgery.  Could contemplate referral for facet joint injections if desired.  Shoulder and knee MRI results are still pending.

## 2020-06-15 NOTE — Telephone Encounter (Signed)
Knee MRI scan shows a substantial amount of arthritis.  Treatment options would include:  -Obtaining insurance approval for gel injections.  -Trying a custom fitted osteoarthritis knee brace.  -Referral for knee replacement if symptoms are severe and not responding to conservative treatment.

## 2020-06-15 NOTE — Telephone Encounter (Signed)
Shoulder MRI shows a torn and retracted supraspinatus tendon.

## 2020-06-17 ENCOUNTER — Ambulatory Visit (INDEPENDENT_AMBULATORY_CARE_PROVIDER_SITE_OTHER): Payer: Self-pay | Admitting: Family Medicine

## 2020-06-17 ENCOUNTER — Telehealth: Payer: Self-pay | Admitting: Family Medicine

## 2020-06-17 ENCOUNTER — Other Ambulatory Visit: Payer: Self-pay

## 2020-06-17 DIAGNOSIS — M545 Low back pain, unspecified: Secondary | ICD-10-CM

## 2020-06-17 DIAGNOSIS — M1712 Unilateral primary osteoarthritis, left knee: Secondary | ICD-10-CM

## 2020-06-17 DIAGNOSIS — M25511 Pain in right shoulder: Secondary | ICD-10-CM

## 2020-06-17 DIAGNOSIS — M1711 Unilateral primary osteoarthritis, right knee: Secondary | ICD-10-CM

## 2020-06-17 DIAGNOSIS — G8929 Other chronic pain: Secondary | ICD-10-CM

## 2020-06-17 NOTE — Telephone Encounter (Signed)
Please request approval for bilateral knee OA gel injections.

## 2020-06-17 NOTE — Progress Notes (Signed)
   Office Visit Note   Patient: Denise Morales           Date of Birth: 1960-08-28           MRN: [id number] Visit Date: 06/17/2020 Requested by: Kerin Perna, NP [address] [place],  [place] [address] PCP: Kerin Perna, NP  Subjective: Chief Complaint  Patient presents with  . Right Shoulder - Follow-up, Pain    Here to discuss findings of MRI further.  PT is on hold for now. She is unsure if she should go to any more.    HPI: She is here to discuss MRI results.  Right shoulder MRI shows a full-thickness supraspinatus tear with 1.6 cm retraction.  Lumbar MRI scan shows advanced facet DJD at L4-5 and L5-S1 with no stenosis.  Left knee MRI scan shows tricompartmental osteoarthritis, severe in the patellofemoral and medial compartments.  She is still having pain in all 3 areas, but especially in the shoulder.                ROS:   All other systems were reviewed and are negative.  Objective: Vital Signs: There were no vitals taken for this visit.  Physical Exam:  General:  Alert and oriented, in no acute distress. Pulm:  Breathing unlabored. Psy:  Normal mood, congruent affect.  No exam done today.  Imaging: No results found.  Assessment & Plan: 1.  Right shoulder rotator cuff tear -Discussed options and elected to refer her to Dr. [name] for surgical repair.  2.  Lumbar facet arthropathy -Future treatment could include referral for facet injections and possibly radiofrequency neurotomy.  3.  Bilateral knee osteoarthritis, left more symptomatic than right. -Discussed options and elected to request approval for gel injections.  We will also try hinged knee braces.  Could contemplate custom braces if needed.     Procedures: No procedures performed        PMFS History: Patient Active Problem List   Diagnosis Date Noted  . Hypertensive disorder 05/05/2019   No past medical history on file.  Family History  Problem Relation Age of  Onset  . Diabetes Father   . Diabetes Sister   . Diabetes Paternal Aunt     Past Surgical History:  Procedure Laterality Date  . ABDOMINAL HYSTERECTOMY  1985   Social History   Occupational History  . Not on file  Tobacco Use  . Smoking status: Never Smoker  . Smokeless tobacco: Never Used  Substance and Sexual Activity  . Alcohol use: Not Currently  . Drug use: Never  . Sexual activity: Not Currently

## 2020-06-22 NOTE — Telephone Encounter (Signed)
Noted  

## 2020-06-30 ENCOUNTER — Telehealth: Payer: Self-pay | Admitting: Family Medicine

## 2020-06-30 ENCOUNTER — Other Ambulatory Visit (INDEPENDENT_AMBULATORY_CARE_PROVIDER_SITE_OTHER): Payer: Self-pay | Admitting: Primary Care

## 2020-06-30 ENCOUNTER — Telehealth (INDEPENDENT_AMBULATORY_CARE_PROVIDER_SITE_OTHER): Payer: Medicaid Other | Admitting: Primary Care

## 2020-06-30 DIAGNOSIS — I10 Essential (primary) hypertension: Secondary | ICD-10-CM

## 2020-06-30 NOTE — Telephone Encounter (Signed)
Pt called wanting to be updated on the process of her approval, she would like a CB t let her know about how much longer she may have to wait.   [phone]

## 2020-06-30 NOTE — Telephone Encounter (Signed)
Talked with patient concerning gel injection.  Advised patient that submission for gel injections just started back today and that once we receive an approval, she will get a call to schedule.  Patient voiced that she understands.

## 2020-07-02 NOTE — Telephone Encounter (Signed)
Will mail [employer] patient assistance application for Monovisc, bilateral knee, due to patient having medicaid.

## 2020-07-05 ENCOUNTER — Telehealth: Payer: Self-pay

## 2020-07-05 NOTE — Telephone Encounter (Signed)
Mailed [employer] patient assistant application to address listed for Monovisc, bilateral knee injection for patient.

## 2020-07-06 ENCOUNTER — Ambulatory Visit (INDEPENDENT_AMBULATORY_CARE_PROVIDER_SITE_OTHER): Payer: No Typology Code available for payment source | Admitting: Orthopaedic Surgery

## 2020-07-06 DIAGNOSIS — S46011D Strain of muscle(s) and tendon(s) of the rotator cuff of right shoulder, subsequent encounter: Secondary | ICD-10-CM

## 2020-07-06 NOTE — Progress Notes (Signed)
Office Visit Note   Patient: Denise Morales           Date of Birth: April 10, 1961           MRN: [id number] Visit Date: 07/06/2020              Requested by: Kerin Perna, NP [address] [place],  [place] [address] PCP: Kerin Perna, NP   Assessment & Plan: Visit Diagnoses:  1. Traumatic complete tear of right rotator cuff, subsequent encounter     Plan: I did share with the patient a shoulder model and explained in detail the anatomy of the shoulder and described left rotator cuff surgery involved with shoulder arthroscopy.  I discussed the risks and benefits of this type of surgery including the risk of rerupture.  We talked about what to expect with her interoperative and postoperative course.  I talked about the long therapy process in the hopes of getting a good recovery that can take 5 to 6 months.  All questions and concerns were answered and addressed.  She is interested having the surgery scheduled.  Follow-Up Instructions: Return for 1 week post-op.   Orders:  No orders of the defined types were placed in this encounter.  No orders of the defined types were placed in this encounter.     Procedures: No procedures performed   Clinical Data: No additional findings.   Subjective: Chief Complaint  Patient presents with  . Right Shoulder - Pain  The patient is a very pleasant 60 year old female referred from Dr. Junius Roads to evaluate and treat right shoulder full-thickness rotator cuff tear.  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  She reports good motion of the shoulder but weakness.  She is tried a steroid injection in that right shoulder and this did not really help much.  A MRI was then obtained showing a full-thickness rotator cuff tear with about 1.6 cm of retraction.  This was a full with  full-thickness tear of the supraspinatus tendon.  There was no muscle atrophy and no other significant derangement.  She is referred here for the possibility of a surgical intervention for her right dominant shoulder.  She denies any other acute medical issues.  She is not a diabetic.  She does not have sleep apnea.  HPI  Review of Systems She currently denies any headache, chest pain, shortness of breath, fever, chills, nausea, vomiting  Objective: Vital Signs: There were no vitals taken for this visit.  Physical Exam She is alert and oriented x3 and in no acute distress Ortho Exam Examination of her right shoulder shows pretty good range of motion but there is definitely weakness with abduction and external rotation.  She is using her deltoids more to abduct her shoulder.  Fortunately her liftoff is negative and her internal rotation with adduction is up to the mid thoracic spine area. Specialty Comments:  No specialty comments available.  Imaging: No results found. The MRI of her right shoulder does show a full-thickness rotator cuff tear but no muscle atrophy.  This involves the supraspinatus tendon.  PMFS History: Patient Active Problem List   Diagnosis Date Noted  . Hypertensive disorder 05/05/2019   No past medical history on file.  Family History  Problem Relation Age of Onset  . Diabetes Father   . Diabetes  Sister   . Diabetes Paternal Aunt     Past Surgical History:  Procedure Laterality Date  . ABDOMINAL HYSTERECTOMY  1985   Social History   Occupational History  . Not on file  Tobacco Use  . Smoking status: Never Smoker  . Smokeless tobacco: Never Used  Substance and Sexual Activity  . Alcohol use: Not Currently  . Drug use: Never  . Sexual activity: Not Currently

## 2020-07-07 DIAGNOSIS — Z0289 Encounter for other administrative examinations: Secondary | ICD-10-CM

## 2020-07-14 ENCOUNTER — Other Ambulatory Visit: Payer: Self-pay | Admitting: Physician Assistant

## 2020-07-15 ENCOUNTER — Encounter (HOSPITAL_BASED_OUTPATIENT_CLINIC_OR_DEPARTMENT_OTHER): Payer: Self-pay | Admitting: Orthopaedic Surgery

## 2020-07-19 ENCOUNTER — Other Ambulatory Visit (HOSPITAL_COMMUNITY)
Admission: RE | Admit: 2020-07-19 | Discharge: 2020-07-19 | Disposition: A | Discharge status: Home / Self Care | Payer: Medicaid Other | Source: Ambulatory Visit | Attending: Orthopaedic Surgery | Admitting: Orthopaedic Surgery

## 2020-07-19 ENCOUNTER — Encounter (HOSPITAL_BASED_OUTPATIENT_CLINIC_OR_DEPARTMENT_OTHER)
Admission: RE | Admit: 2020-07-19 | Discharge: 2020-07-19 | Disposition: A | Discharge status: Home / Self Care | Payer: Medicaid Other | Source: Ambulatory Visit | Attending: Orthopaedic Surgery | Admitting: Orthopaedic Surgery

## 2020-07-19 ENCOUNTER — Other Ambulatory Visit: Payer: Self-pay | Admitting: Family Medicine

## 2020-07-19 DIAGNOSIS — Z01812 Encounter for preprocedural laboratory examination: Secondary | ICD-10-CM | POA: Diagnosis not present

## 2020-07-19 DIAGNOSIS — Z01818 Encounter for other preprocedural examination: Secondary | ICD-10-CM | POA: Insufficient documentation

## 2020-07-19 DIAGNOSIS — U071 COVID-19: Secondary | ICD-10-CM | POA: Diagnosis not present

## 2020-07-19 LAB — BASIC METABOLIC PANEL
Anion gap: 13 (ref 5–15)
BUN: 19 mg/dL (ref 8–23)
CO2: 25 mmol/L (ref 22–32)
Calcium: 9.3 mg/dL (ref 8.9–10.3)
Chloride: 104 mmol/L (ref 98–111)
Creatinine, Ser: 1.16 mg/dL — ABNORMAL HIGH (ref 0.44–1.00)
GFR, Estimated: 54 mL/min — ABNORMAL LOW (ref >60)
Glucose, Bld: 97 mg/dL (ref 70–99)
Potassium: 4.2 mmol/L (ref 3.5–5.1)
Sodium: 142 mmol/L (ref 135–145)

## 2020-07-19 LAB — SARS CORONAVIRUS 2 (TAT 6-24 HRS): SARS Coronavirus 2: POSITIVE — AB

## 2020-07-19 NOTE — Progress Notes (Signed)

## 2020-07-20 ENCOUNTER — Ambulatory Visit (INDEPENDENT_AMBULATORY_CARE_PROVIDER_SITE_OTHER): Payer: Self-pay

## 2020-07-20 ENCOUNTER — Telehealth (INDEPENDENT_AMBULATORY_CARE_PROVIDER_SITE_OTHER): Payer: Medicaid Other | Admitting: Primary Care

## 2020-07-20 DIAGNOSIS — K2100 Gastro-esophageal reflux disease with esophagitis, without bleeding: Secondary | ICD-10-CM

## 2020-07-20 DIAGNOSIS — F4323 Adjustment disorder with mixed anxiety and depressed mood: Secondary | ICD-10-CM

## 2020-07-20 DIAGNOSIS — U071 COVID-19: Secondary | ICD-10-CM

## 2020-07-20 MED ORDER — OMEPRAZOLE 40 MG PO CPDR: 40.0000 mg | DELAYED_RELEASE_CAPSULE | Freq: Every day | ORAL | 1 refills | Status: DC

## 2020-07-20 MED ORDER — ESCITALOPRAM OXALATE 20 MG PO TABS: 20.0000 mg | ORAL_TABLET | Freq: Every day | ORAL | 1 refills | Status: DC

## 2020-07-20 NOTE — Telephone Encounter (Signed)
Patient called stating that she has just tested positive for COVID-19 She was tested for surgery which has been postponed until 08/05/20.  She has spoken with Juluis Mire today.  She was calling for advice as to how she should treat herself. She was advice to rest drink plenty of fluids and take OTC medication to treat any symptoms.  She has had mild headache and some chills last week but none now. She was advised to contact her surgeon for further instructions. She will isolate 5 days fever free for 24 hours without fever reducing medications and any respiratory symptoms resolving.  Then 5 days in mask at all times while with others.  She verbalized understanding of all information.  Reason for Disposition . [1] [id number] diagnosed by positive lab test (e.g., PCR, rapid self-test kit) AND [2] NO symptoms (e.g., cough, fever, others)  Answer Assessment - Initial Assessment Questions 1. COVID-19 DIAGNOSIS: "Who made your COVID-19 diagnosis?" "Was it confirmed by a positive lab test?" If not diagnosed by a HCP, ask "Are there lots of cases (community spread) where you live?" Note: See public health department website, if unsure.    yes 2. COVID-19 EXPOSURE: "Was there any known exposure to COVID before the symptoms began?" CDC Definition of close contact: within 6 feet (2 meters) for a total of 15 minutes or more over a 24-hour period.    yes 3. ONSET: "When did the COVID-19 symptoms start?"    Last week one night  Chills. 4. WORST SYMPTOM: "What is your worst symptom?" (e.g., cough, fever, shortness of breath, muscle aches)    None now 5. COUGH: "Do you have a cough?" If Yes, ask: "How bad is the cough?"       no 6. FEVER: "Do you have a fever?" If Yes, ask: "What is your temperature, how was it measured, and when did it start?"    no 7. RESPIRATORY STATUS: "Describe your breathing?" (e.g., shortness of breath, wheezing, unable to speak)    no 8. BETTER-SAME-WORSE: "Are you getting better,  staying the same or getting worse compared to yesterday?"  If getting worse, ask, "In what way?"     better 9. HIGH RISK DISEASE: "Do you have any chronic medical problems?" (e.g., asthma, heart or lung disease, weak immune system, obesity, etc.)    Surgery for shoulder postoned 10. VACCINE: "Have you gotten the COVID-19 vaccine?" If Yes ask: "Which one, how many shots, when did you get it?"      Vac abd boosted 11. PREGNANCY: "Is there any chance you are pregnant?" "When was your last menstrual period?"       N/A 12. OTHER SYMPTOMS: "Do you have any other symptoms?"  (e.g., chills, fatigue, headache, loss of smell or taste, muscle pain, sore throat; new loss of smell or taste especially support the diagnosis of COVID-19)       headache  Protocols used: CORONAVIRUS (COVID-19) DIAGNOSED OR SUSPECTED-A-AH

## 2020-07-20 NOTE — Progress Notes (Signed)
Virtual Visit  I connected with [name] on 07/20/20 at 10:10 AM EST by telephone and verified that I am speaking with the correct person using two identifiers.  Location: Patient: home  Provider: [place] [facility] [email or web address]    I discussed the limitations, risks, security and privacy concerns of performing an evaluation and management service by telephone and the availability of in person appointments. I also discussed with the patient that there may be a patient responsible charge related to this service. The patient expressed understanding and agreed to proceed.   History of Present Illness: [name] is a 60 year old female follow up on depression started a low dose Lexapro. Her Daughter and patient saw some improvement. ( she is also schedule to have surgery on her shoulder but went on 07/19/20 blood work, EKG but was COVID test was positive and reschedule surgery 08/05/20). She has notice a decline in activities and wanting to do anything. We discussed increasing medication.    Past Medical History:  Diagnosis Date  . Depression   . GERD (gastroesophageal reflux disease)   . Hypertension    Current Outpatient Medications on File Prior to Visit  Medication Sig Dispense Refill  . acetaminophen (TYLENOL) 500 MG tablet Take 1,000 mg by mouth every 6 (six) hours as needed.    . baclofen (LIORESAL) 10 MG tablet TAKE 1/2 TO 1 (ONE-HALF TO ONE) TABLET BY MOUTH THREE TIMES DAILY AS NEEDED FOR MUSCLE SPASMS 30 tablet 0  . Cholecalciferol (VITAMIN D-3) 125 MCG (5000 UT) TABS Take 1 tablet by mouth daily. 90 tablet 3  . escitalopram (LEXAPRO) 10 MG tablet Take 1 tablet (10 mg total) by mouth daily. 90 tablet 1  . lisinopril-hydrochlorothiazide (ZESTORETIC) 10-12.5 MG tablet Take 1 tablet by mouth once daily 90 tablet 0  . Menatetrenone (VITAMIN K2) 100 MCG TABS Take 100 mcg by mouth daily. 90 tablet 3  . nabumetone (RELAFEN) 500 MG tablet Take 1 tablet (500 mg total) by mouth 2 (two)  times daily as needed. 60 tablet 3  . omeprazole (PRILOSEC) 20 MG capsule Take 20 mg by mouth daily.    . pravastatin (PRAVACHOL) 40 MG tablet Take 1 tablet (40 mg total) by mouth daily. 90 tablet 3  . traMADol (ULTRAM) 50 MG tablet Take by mouth every 6 (six) hours as needed.     No current facility-administered medications on file prior to visit.   Observations/Objective: There were no vitals taken for this visit. change in mood and activity   Assessment and Plan: Diagnoses and all orders for this visit:  Adjustment disorder with mixed anxiety and depressed mood Refer to HPI increase Lexapro 20mg  at bedtime f/u for effectiveness   Gastroesophageal reflux disease with esophagitis, unspecified whether hemorrhage Discussed eating small frequent meal, reduction in acidic foods, fried foods ,spicy foods, alcohol caffeine and tobacco and certain medications. Avoid laying down after eating 51mins-1hour, elevated head of the bed.  omeprazole (PRILOSEC) 40 MG capsule; Take 1 capsule (40 mg total) by mouth daily.  COVID Your test for COVID-19 was positive, meaning that you were infected with the novel coronavirus and could give the germ to others.  Please continue isolation at home, for at least 5 days since the start of your fever/cough/breathlessness and until you have had 3 consecutive days without fever (without taking a fever reducer) and with cough/breathlessness improving. Please continue good preventive care measures, including:  frequent hand-washing, avoid touching your face, cover coughs/sneezes, stay out of crowds and keep a 6  foot distance from others.  Recheck or go to the nearest hospital ED tent for re-assessment if fever/cough/breathlessness return.  Other orders -     escitalopram (LEXAPRO) 20 MG tablet; Take 1 tablet (20 mg total) by mouth daily. -     omeprazole (PRILOSEC) 40 MG capsule; Take 1 capsule (40 mg total) by mouth daily.      Follow Up Instructions:    I  discussed the assessment and treatment plan with the patient. The patient was provided an opportunity to ask questions and all were answered. The patient agreed with the plan and demonstrated an understanding of the instructions.   The patient was advised to call back or seek an in-person evaluation if the symptoms worsen or if the condition fails to improve as anticipated.  I provided 24 minutes of non-face-to-face time during this encounter.   Kerin Perna, NP

## 2020-07-21 ENCOUNTER — Telehealth: Payer: Self-pay

## 2020-07-21 NOTE — Telephone Encounter (Signed)
That's why I submitted for [employer], but when I called to see why she was denied, Gari Crown stated that she had insurance and that the patient was not eligible for patient assistance.

## 2020-07-21 NOTE — Telephone Encounter (Signed)
Per the chart, she has [employer]. That covers gel injections?

## 2020-07-21 NOTE — Telephone Encounter (Signed)
Please advise on the alternative next step for the patient's bilateral knee OA pain.

## 2020-07-21 NOTE — Telephone Encounter (Signed)
FYI- Per Shantel with [employer] patient assistance, patient was denied due to having insurance that will cover gel injection.  Please advise on next option for patient.  Thank you

## 2020-07-21 NOTE — Telephone Encounter (Signed)
Could try custom knee brace, or possibly dextrose injections.  Or else referral for knee replacement.

## 2020-07-22 NOTE — Telephone Encounter (Signed)
I sent this information to the patient through [facility].

## 2020-07-29 ENCOUNTER — Inpatient Hospital Stay: Payer: Medicaid Other | Admitting: Physician Assistant

## 2020-08-02 NOTE — Telephone Encounter (Signed)
I will check with Wendy May on this.  Thank you.

## 2020-08-05 ENCOUNTER — Encounter (HOSPITAL_BASED_OUTPATIENT_CLINIC_OR_DEPARTMENT_OTHER): Payer: Self-pay | Admitting: Orthopaedic Surgery

## 2020-08-05 ENCOUNTER — Other Ambulatory Visit: Payer: Self-pay

## 2020-08-08 ENCOUNTER — Other Ambulatory Visit: Payer: Self-pay | Admitting: Family Medicine

## 2020-08-09 ENCOUNTER — Encounter (HOSPITAL_BASED_OUTPATIENT_CLINIC_OR_DEPARTMENT_OTHER)
Admission: RE | Admit: 2020-08-09 | Discharge: 2020-08-09 | Disposition: A | Discharge status: Home / Self Care | Payer: Medicaid Other | Source: Ambulatory Visit | Attending: Orthopaedic Surgery | Admitting: Orthopaedic Surgery

## 2020-08-09 ENCOUNTER — Other Ambulatory Visit: Payer: Self-pay

## 2020-08-09 DIAGNOSIS — Z01812 Encounter for preprocedural laboratory examination: Secondary | ICD-10-CM | POA: Insufficient documentation

## 2020-08-09 LAB — BASIC METABOLIC PANEL
Anion gap: 11 (ref 5–15)
BUN: 23 mg/dL (ref 8–23)
CO2: 24 mmol/L (ref 22–32)
Calcium: 9.1 mg/dL (ref 8.9–10.3)
Chloride: 107 mmol/L (ref 98–111)
Creatinine, Ser: 1.16 mg/dL — ABNORMAL HIGH (ref 0.44–1.00)
GFR, Estimated: 54 mL/min — ABNORMAL LOW (ref >60)
Glucose, Bld: 98 mg/dL (ref 70–99)
Potassium: 3.6 mmol/L (ref 3.5–5.1)
Sodium: 142 mmol/L (ref 135–145)

## 2020-08-12 ENCOUNTER — Encounter (HOSPITAL_BASED_OUTPATIENT_CLINIC_OR_DEPARTMENT_OTHER)
Admission: RE | Disposition: A | Discharge status: Home / Self Care | Payer: Self-pay | Source: Home / Self Care | Attending: Orthopaedic Surgery

## 2020-08-12 ENCOUNTER — Ambulatory Visit (HOSPITAL_BASED_OUTPATIENT_CLINIC_OR_DEPARTMENT_OTHER): Payer: Self-pay | Admitting: Certified Registered"

## 2020-08-12 ENCOUNTER — Encounter (HOSPITAL_BASED_OUTPATIENT_CLINIC_OR_DEPARTMENT_OTHER): Payer: Self-pay | Admitting: Orthopaedic Surgery

## 2020-08-12 ENCOUNTER — Other Ambulatory Visit: Payer: Self-pay

## 2020-08-12 ENCOUNTER — Ambulatory Visit (HOSPITAL_BASED_OUTPATIENT_CLINIC_OR_DEPARTMENT_OTHER)
Admission: RE | Admit: 2020-08-12 | Discharge: 2020-08-12 | Disposition: A | Discharge status: Home / Self Care | Payer: Self-pay | Attending: Orthopaedic Surgery | Admitting: Orthopaedic Surgery

## 2020-08-12 DIAGNOSIS — M75121 Complete rotator cuff tear or rupture of right shoulder, not specified as traumatic: Secondary | ICD-10-CM

## 2020-08-12 DIAGNOSIS — Z833 Family history of diabetes mellitus: Secondary | ICD-10-CM | POA: Insufficient documentation

## 2020-08-12 DIAGNOSIS — G8918 Other acute postprocedural pain: Secondary | ICD-10-CM | POA: Diagnosis not present

## 2020-08-12 DIAGNOSIS — K219 Gastro-esophageal reflux disease without esophagitis: Secondary | ICD-10-CM | POA: Diagnosis not present

## 2020-08-12 DIAGNOSIS — Z79899 Other long term (current) drug therapy: Secondary | ICD-10-CM | POA: Insufficient documentation

## 2020-08-12 DIAGNOSIS — I10 Essential (primary) hypertension: Secondary | ICD-10-CM | POA: Diagnosis not present

## 2020-08-12 HISTORY — PX: SHOULDER ARTHROSCOPY WITH ROTATOR CUFF REPAIR AND SUBACROMIAL DECOMPRESSION: SHX5686

## 2020-08-12 HISTORY — DX: Depression, unspecified: F32.A

## 2020-08-12 HISTORY — DX: Gastro-esophageal reflux disease without esophagitis: K21.9

## 2020-08-12 HISTORY — DX: Essential (primary) hypertension: I10

## 2020-08-12 SURGERY — SHOULDER ARTHROSCOPY WITH ROTATOR CUFF REPAIR AND SUBACROMIAL DECOMPRESSION
Anesthesia: General | Site: Shoulder | Laterality: Right

## 2020-08-12 MED ORDER — PROMETHAZINE HCL 25 MG/ML IJ SOLN: 6.2500 mg | INTRAMUSCULAR | Status: DC | PRN

## 2020-08-12 MED ORDER — BUPIVACAINE HCL (PF) 0.5 % IJ SOLN
INTRAMUSCULAR | Status: DC | PRN
  Administered 2020-08-12: 20 mL via PERINEURAL

## 2020-08-12 MED ORDER — PROPOFOL 10 MG/ML IV BOLUS
INTRAVENOUS | Status: DC | PRN
  Administered 2020-08-12: 200 mg via INTRAVENOUS

## 2020-08-12 MED ORDER — FENTANYL CITRATE (PF) 100 MCG/2ML IJ SOLN
INTRAMUSCULAR | Status: DC | PRN
  Administered 2020-08-12: 50 ug via INTRAVENOUS
  Administered 2020-08-12 (×2): 25 ug via INTRAVENOUS

## 2020-08-12 MED ORDER — LACTATED RINGERS IV SOLN: INTRAVENOUS | Status: DC | PRN

## 2020-08-12 MED ORDER — HYDROMORPHONE HCL 1 MG/ML IJ SOLN: 0.2500 mg | INTRAMUSCULAR | Status: DC | PRN

## 2020-08-12 MED ORDER — EPHEDRINE SULFATE 50 MG/ML IJ SOLN
INTRAMUSCULAR | Status: DC | PRN
  Administered 2020-08-12: 10 mg via INTRAVENOUS
  Administered 2020-08-12: 15 mg via INTRAVENOUS

## 2020-08-12 MED ORDER — FENTANYL CITRATE (PF) 100 MCG/2ML IJ SOLN
50.0000 ug | Freq: Once | INTRAMUSCULAR | Status: AC
  Administered 2020-08-12: 100 ug via INTRAVENOUS

## 2020-08-12 MED ORDER — ALBUTEROL SULFATE HFA 108 (90 BASE) MCG/ACT IN AERS
INHALATION_SPRAY | RESPIRATORY_TRACT | Status: DC | PRN
  Administered 2020-08-12: 3 via RESPIRATORY_TRACT

## 2020-08-12 MED ORDER — PROPOFOL 10 MG/ML IV BOLUS
INTRAVENOUS | Status: AC
  Filled 2020-08-12: qty 20

## 2020-08-12 MED ORDER — FENTANYL CITRATE (PF) 100 MCG/2ML IJ SOLN
INTRAMUSCULAR | Status: AC
  Filled 2020-08-12: qty 2

## 2020-08-12 MED ORDER — MEPERIDINE HCL 25 MG/ML IJ SOLN: 6.2500 mg | INTRAMUSCULAR | Status: DC | PRN

## 2020-08-12 MED ORDER — AMISULPRIDE (ANTIEMETIC) 5 MG/2ML IV SOLN: 10.0000 mg | Freq: Once | INTRAVENOUS | Status: DC | PRN

## 2020-08-12 MED ORDER — MIDAZOLAM HCL 2 MG/2ML IJ SOLN
INTRAMUSCULAR | Status: AC
  Filled 2020-08-12: qty 2

## 2020-08-12 MED ORDER — ONDANSETRON HCL 4 MG/2ML IJ SOLN
INTRAMUSCULAR | Status: AC
  Filled 2020-08-12: qty 2

## 2020-08-12 MED ORDER — ONDANSETRON HCL 4 MG/2ML IJ SOLN
INTRAMUSCULAR | Status: DC | PRN
  Administered 2020-08-12: 4 mg via INTRAVENOUS

## 2020-08-12 MED ORDER — BUPIVACAINE HCL (PF) 0.5 % IJ SOLN
INTRAMUSCULAR | Status: AC
  Filled 2020-08-12: qty 30

## 2020-08-12 MED ORDER — PHENYLEPHRINE HCL (PRESSORS) 10 MG/ML IV SOLN
INTRAVENOUS | Status: DC | PRN
  Administered 2020-08-12: 200 ug via INTRAVENOUS
  Administered 2020-08-12 (×2): 120 ug via INTRAVENOUS

## 2020-08-12 MED ORDER — OXYCODONE HCL 5 MG PO TABS: 5.0000 mg | ORAL_TABLET | Freq: Once | ORAL | Status: DC | PRN

## 2020-08-12 MED ORDER — SODIUM CHLORIDE 0.9 % IR SOLN
Status: DC | PRN
  Administered 2020-08-12: 12000 mL

## 2020-08-12 MED ORDER — GLYCOPYRROLATE 0.2 MG/ML IJ SOLN
INTRAMUSCULAR | Status: DC | PRN
  Administered 2020-08-12: .2 mg via INTRAVENOUS

## 2020-08-12 MED ORDER — BUPIVACAINE LIPOSOME 1.3 % IJ SUSP
INTRAMUSCULAR | Status: DC | PRN
  Administered 2020-08-12: 10 mL via PERINEURAL

## 2020-08-12 MED ORDER — LACTATED RINGERS IV SOLN: INTRAVENOUS | Status: DC

## 2020-08-12 MED ORDER — CEFAZOLIN SODIUM-DEXTROSE 2-4 GM/100ML-% IV SOLN: 2.0000 g | INTRAVENOUS | Status: DC

## 2020-08-12 MED ORDER — DEXAMETHASONE SODIUM PHOSPHATE 10 MG/ML IJ SOLN
INTRAMUSCULAR | Status: DC | PRN
  Administered 2020-08-12: 10 mg via INTRAVENOUS

## 2020-08-12 MED ORDER — OXYCODONE HCL 5 MG/5ML PO SOLN: 5.0000 mg | Freq: Once | ORAL | Status: DC | PRN

## 2020-08-12 MED ORDER — LIDOCAINE HCL (CARDIAC) PF 100 MG/5ML IV SOSY
PREFILLED_SYRINGE | INTRAVENOUS | Status: DC | PRN
  Administered 2020-08-12: 80 mg via INTRAVENOUS

## 2020-08-12 MED ORDER — OXYCODONE HCL 5 MG PO TABS: 5.0000 mg | ORAL_TABLET | ORAL | 0 refills | Status: DC | PRN

## 2020-08-12 MED ORDER — MIDAZOLAM HCL 2 MG/2ML IJ SOLN
1.0000 mg | Freq: Once | INTRAMUSCULAR | Status: AC
  Administered 2020-08-12: 2 mg via INTRAVENOUS

## 2020-08-12 MED ORDER — CEFAZOLIN SODIUM-DEXTROSE 2-3 GM-%(50ML) IV SOLR
INTRAVENOUS | Status: DC | PRN
  Administered 2020-08-12: 2 g via INTRAVENOUS

## 2020-08-12 MED ORDER — CEFAZOLIN SODIUM-DEXTROSE 2-4 GM/100ML-% IV SOLN
INTRAVENOUS | Status: AC
  Filled 2020-08-12: qty 100

## 2020-08-12 SURGICAL SUPPLY — 60 items
AID PSTN UNV HD RSTRNT DISP (MISCELLANEOUS) ×1
ANCH SUT SWLK 19.1X4.75 (Anchor) ×2 IMPLANT
ANCHOR SUT BIO SW 4.75X19.1 (Anchor) ×2 IMPLANT
BLADE SURG 15 STRL LF DISP TIS (BLADE) IMPLANT
BLADE SURG 15 STRL SS (BLADE)
BURR OVAL 8 FLU 4.0X13 (MISCELLANEOUS) ×2 IMPLANT
BURR OVAL 8 FLU 5.0X13 (MISCELLANEOUS) IMPLANT
CANNULA TWIST IN 8.25X7CM (CANNULA) ×1 IMPLANT
COVER WAND RF STERILE (DRAPES) IMPLANT
DECANTER SPIKE VIAL GLASS SM (MISCELLANEOUS) IMPLANT
DISSECTOR  3.8MM X 13CM (MISCELLANEOUS) ×2
DISSECTOR 3.8MM X 13CM (MISCELLANEOUS) ×1 IMPLANT
DISSECTOR 4.0MM X 13CM (MISCELLANEOUS) ×2 IMPLANT
DRAPE IMP U-DRAPE 54X76 (DRAPES) ×2 IMPLANT
DRAPE SHOULDER BEACH CHAIR (DRAPES) ×2 IMPLANT
DRAPE U-SHAPE 47X51 STRL (DRAPES) ×2 IMPLANT
DRSG PAD ABDOMINAL 8X10 ST (GAUZE/BANDAGES/DRESSINGS) ×2 IMPLANT
DURAPREP 26ML APPLICATOR (WOUND CARE) ×2 IMPLANT
ELECT REM PT RETURN 9FT ADLT (ELECTROSURGICAL)
ELECTRODE REM PT RTRN 9FT ADLT (ELECTROSURGICAL) IMPLANT
GAUZE SPONGE 4X4 12PLY STRL (GAUZE/BANDAGES/DRESSINGS) ×2 IMPLANT
GAUZE SPONGE 4X4 12PLY STRL LF (GAUZE/BANDAGES/DRESSINGS) ×1 IMPLANT
GAUZE XEROFORM 1X8 LF (GAUZE/BANDAGES/DRESSINGS) ×2 IMPLANT
GLOVE SRG 8 PF TXTR STRL LF DI (GLOVE) ×2 IMPLANT
GLOVE SURG ENC MOIS LTX SZ7.5 (GLOVE) ×2 IMPLANT
GLOVE SURG ORTHO LTX SZ8 (GLOVE) ×2 IMPLANT
GLOVE SURG UNDER POLY LF SZ8 (GLOVE) ×4
GOWN STRL REUS W/ TWL LRG LVL3 (GOWN DISPOSABLE) ×1 IMPLANT
GOWN STRL REUS W/ TWL XL LVL3 (GOWN DISPOSABLE) ×1 IMPLANT
GOWN STRL REUS W/TWL LRG LVL3 (GOWN DISPOSABLE) ×2
GOWN STRL REUS W/TWL XL LVL3 (GOWN DISPOSABLE) ×2
KIT SHOULDER TRACTION (DRAPES) ×2 IMPLANT
MANIFOLD NEPTUNE II (INSTRUMENTS) ×2 IMPLANT
NDL SAFETY ECLIPSE 18X1.5 (NEEDLE) IMPLANT
NDL SCORPION MULTI FIRE (NEEDLE) IMPLANT
NEEDLE HYPO 18GX1.5 SHARP (NEEDLE)
NEEDLE SCORPION MULTI FIRE (NEEDLE) ×2 IMPLANT
NS IRRIG 1000ML POUR BTL (IV SOLUTION) IMPLANT
PACK ARTHROSCOPY DSU (CUSTOM PROCEDURE TRAY) ×2 IMPLANT
PACK BASIN DAY SURGERY FS (CUSTOM PROCEDURE TRAY) ×2 IMPLANT
PAD ORTHO SHOULDER 7X19 LRG (SOFTGOODS) IMPLANT
PENCIL SMOKE EVACUATOR (MISCELLANEOUS) IMPLANT
PORT APPOLLO RF 90DEGREE MULTI (SURGICAL WAND) ×2 IMPLANT
RESTRAINT HEAD UNIVERSAL NS (MISCELLANEOUS) ×2 IMPLANT
SLING ARM FOAM STRAP LRG (SOFTGOODS) IMPLANT
SLING ULTRA II MEDIUM (SOFTGOODS) IMPLANT
SPONGE LAP 4X18 RFD (DISPOSABLE) IMPLANT
SUCTION FRAZIER HANDLE 10FR (MISCELLANEOUS)
SUCTION TUBE FRAZIER 10FR DISP (MISCELLANEOUS) IMPLANT
SUT ETHIBOND 2 OS 4 DA (SUTURE) IMPLANT
SUT ETHILON 3 0 PS 1 (SUTURE) ×2 IMPLANT
SUT VIC AB 2-0 SH 27 (SUTURE)
SUT VIC AB 2-0 SH 27XBRD (SUTURE) IMPLANT
SYR 20ML LL LF (SYRINGE) IMPLANT
SYR BULB EAR ULCER 3OZ GRN STR (SYRINGE) IMPLANT
TAPE HYPAFIX 6X30 (GAUZE/BANDAGES/DRESSINGS) ×2 IMPLANT
TOWEL GREEN STERILE FF (TOWEL DISPOSABLE) ×2 IMPLANT
TUBE CONNECTING 20X1/4 (TUBING) ×2 IMPLANT
TUBING ARTHROSCOPY IRRIG 16FT (MISCELLANEOUS) ×2 IMPLANT
WATER STERILE IRR 1000ML POUR (IV SOLUTION) ×2 IMPLANT

## 2020-08-12 NOTE — Discharge Instructions (Signed)
Slowly increase her activities as comfort allows. Expect right shoulder swelling and some bloody drainage.  Ice her shoulder periodically as needed. You may change your dressings in 24 to 48 hours and get your shoulder wet in the shower. Place Band-Aids over all your incisions daily. Do not reach overhead or behind you with the right arm until further notice. You need to sleep in your sling. Occasionally come out of your sling during the day just to gently move your shoulder but no aggressive movements and no lifting with that arm at all until further notice.   Post Anesthesia Home Care Instructions  Activity: Get plenty of rest for the remainder of the day. A responsible individual must stay with you for 24 hours following the procedure.  For the next 24 hours, DO NOT: -Drive a car -Paediatric nurse -Drink alcoholic beverages -Take any medication unless instructed by your physician -Make any legal decisions or sign important papers.  Meals: Start with liquid foods such as gelatin or soup. Progress to regular foods as tolerated. Avoid greasy, spicy, heavy foods. If nausea and/or vomiting occur, drink only clear liquids until the nausea and/or vomiting subsides. Call your physician if vomiting continues.  Special Instructions/Symptoms: Your throat may feel dry or sore from the anesthesia or the breathing tube placed in your throat during surgery. If this causes discomfort, gargle with warm salt water. The discomfort should disappear within 24 hours.  If you had a scopolamine patch placed behind your ear for the management of post- operative nausea and/or vomiting:  1. The medication in the patch is effective for 72 hours, after which it should be removed.  Wrap patch in a tissue and discard in the trash. Wash hands thoroughly with soap and water. 2. You may remove the patch earlier than 72 hours if you experience unpleasant side effects which may include dry mouth, dizziness or visual  disturbances. 3. Avoid touching the patch. Wash your hands with soap and water after contact with the patch.    Regional Anesthesia Blocks  1. Numbness or the inability to move the "blocked" extremity may last from 3-48 hours after placement. The length of time depends on the medication injected and your individual response to the medication. If the numbness is not going away after 48 hours, call your surgeon.  2. The extremity that is blocked will need to be protected until the numbness is gone and the  Strength has returned. Because you cannot feel it, you will need to take extra care to avoid injury. Because it may be weak, you may have difficulty moving it or using it. You may not know what position it is in without looking at it while the block is in effect.  3. For blocks in the legs and feet, returning to weight bearing and walking needs to be done carefully. You will need to wait until the numbness is entirely gone and the strength has returned. You should be able to move your leg and foot normally before you try and bear weight or walk. You will need someone to be with you when you first try to ensure you do not fall and possibly risk injury.  4. Bruising and tenderness at the needle site are common side effects and will resolve in a few days.  5. Persistent numbness or new problems with movement should be communicated to the surgeon or the [facility] [phone] [place] [phone]).

## 2020-08-12 NOTE — Anesthesia Preprocedure Evaluation (Signed)
Anesthesia Evaluation  Patient identified by MRN, date of birth, ID band Patient awake    Reviewed: Allergy & Precautions, H&P , NPO status , Patient's Chart, lab work & pertinent test results  Airway Mallampati: II  TM Distance: >3 FB Neck ROM: Full    Dental no notable dental hx.    Pulmonary neg pulmonary ROS,    Pulmonary exam normal breath sounds clear to auscultation       Cardiovascular hypertension, Pt. on medications negative cardio ROS Normal cardiovascular exam Rhythm:Regular Rate:Normal     Neuro/Psych Depression negative neurological ROS  negative psych ROS   GI/Hepatic Neg liver ROS, GERD  ,  Endo/Other  negative endocrine ROS  Renal/GU negative Renal ROS  negative genitourinary   Musculoskeletal negative musculoskeletal ROS (+)   Abdominal (+) + obese,   Peds negative pediatric ROS (+)  Hematology negative hematology ROS (+)   Anesthesia Other Findings   Reproductive/Obstetrics negative OB ROS                             Anesthesia Physical Anesthesia Plan  ASA: II  Anesthesia Plan: General   Post-op Pain Management:  Regional for Post-op pain   Induction: Intravenous  PONV Risk Score and Plan: 3 and Ondansetron, Dexamethasone, Midazolam and Treatment may vary due to age or medical condition  Airway Management Planned: LMA  Additional Equipment:   Intra-op Plan:   Post-operative Plan: Extubation in OR  Informed Consent: I have reviewed the patients History and Physical, chart, labs and discussed the procedure including the risks, benefits and alternatives for the proposed anesthesia with the patient or authorized representative who has indicated his/her understanding and acceptance.     Dental advisory given  Plan Discussed with: CRNA  Anesthesia Plan Comments:         Anesthesia Quick Evaluation

## 2020-08-12 NOTE — Anesthesia Procedure Notes (Signed)
Procedure Name: LMA Insertion Performed by: Tayt Moyers M, CRNA Pre-anesthesia Checklist: Patient identified, Emergency Drugs available, Suction available and Patient being monitored Patient Re-evaluated:Patient Re-evaluated prior to induction Oxygen Delivery Method: Circle system utilized Preoxygenation: Pre-oxygenation with 100% oxygen Induction Type: IV induction Ventilation: Mask ventilation without difficulty LMA: LMA inserted LMA Size: 4.0 Number of attempts: 1 Airway Equipment and Method: Bite block Placement Confirmation: positive ETCO2 Tube secured with: Tape Dental Injury: Teeth and Oropharynx as per pre-operative assessment        

## 2020-08-12 NOTE — Anesthesia Postprocedure Evaluation (Signed)
Anesthesia Post Note  Patient: Denise Morales  Procedure(s) Performed: RIGHT SHOULDER ARTHROSCOPY WITH ROTATOR CUFF REPAIR AND SUBACROMIAL DECOMPRESSION (Right Shoulder)     Patient location during evaluation: PACU Anesthesia Type: General Level of consciousness: awake and alert Pain management: pain level controlled Vital Signs Assessment: post-procedure vital signs reviewed and stable Respiratory status: spontaneous breathing, nonlabored ventilation and respiratory function stable Cardiovascular status: blood pressure returned to baseline and stable Postop Assessment: no apparent nausea or vomiting Anesthetic complications: no   No complications documented.  Last Vitals:  Vitals:   08/12/20 1045 08/12/20 1114  BP: (!) 124/97 110/83  Pulse: (!) 103   Resp: 19   Temp:  (!) 36.1 C  SpO2: 96% 92%    Last Pain:  Vitals:   08/12/20 1114  TempSrc:   PainSc: 0-No pain                 Lynda Rainwater

## 2020-08-12 NOTE — H&P (Signed)
Denise Morales is an 60 y.o. female.   Chief Complaint: Right shoulder pain and weakness HPI:   The patient is a 60 year old female with a known full-thickness rotator cuff tear of the right shoulder.  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  She had to lift her mother quite a bit for a while.  Her right shoulder has slowly become more painful and weak.  Eventually an MRI was obtained of the right shoulder showing a full-thickness rotator cuff tear with just over centimeter of retraction but no muscle atrophy.  At this point with very conservative treatment and arthroscopic intervention for the shoulder has been recommended.  Past Medical History:  Diagnosis Date  . Depression   . GERD (gastroesophageal reflux disease)   . Hypertension     Past Surgical History:  Procedure Laterality Date  . ABDOMINAL HYSTERECTOMY  1985  . CHOLECYSTECTOMY      Family History  Problem Relation Age of Onset  . Diabetes Father   . Diabetes Sister   . Diabetes Paternal Aunt    Social History:  reports that she has never smoked. She has never used smokeless tobacco. She reports previous alcohol use. She reports that she does not use drugs.  Allergies: No Known Allergies  Medications Prior to Admission  Medication Sig Dispense Refill  . acetaminophen (TYLENOL) 500 MG tablet Take 1,000 mg by mouth every 6 (six) hours as needed.    . baclofen (LIORESAL) 10 MG tablet TAKE 1/2 TO 1 (ONE-HALF TO ONE) TABLET BY MOUTH THREE TIMES DAILY AS NEEDED FOR MUSCLE SPASMS 30 tablet 0  . Cholecalciferol (VITAMIN D-3) 125 MCG (5000 UT) TABS Take 1 tablet by mouth daily. 90 tablet 3  . escitalopram (LEXAPRO) 20 MG tablet Take 1 tablet (20 mg total) by mouth daily. 90 tablet 1  . lisinopril-hydrochlorothiazide (ZESTORETIC) 10-12.5 MG tablet Take 1 tablet by mouth once daily 90 tablet 0  . Menatetrenone (VITAMIN K2) 100 MCG TABS Take 100 mcg by mouth daily. 90 tablet 3  . nabumetone (RELAFEN)  500 MG tablet Take 1 tablet by mouth twice daily as needed 60 tablet 0  . omeprazole (PRILOSEC) 40 MG capsule Take 1 capsule (40 mg total) by mouth daily. 90 capsule 1  . pravastatin (PRAVACHOL) 40 MG tablet Take 1 tablet (40 mg total) by mouth daily. 90 tablet 3  . traMADol (ULTRAM) 50 MG tablet Take by mouth every 6 (six) hours as needed.      No results found for this or any previous visit (from the past 48 hour(s)). No results found.  Review of Systems  All other systems reviewed and are negative.   Pulse 72, temperature 98.1 F (36.7 C), temperature source Oral, resp. rate 16, height 4\' 11"  (1.499 m), weight 78 kg, SpO2 97 %. Physical Exam Vitals reviewed.  Constitutional:      Appearance: Normal appearance.  HENT:     Head: Normocephalic and atraumatic.  Cardiovascular:     Rate and Rhythm: Normal rate and regular rhythm.  Pulmonary:     Effort: Pulmonary effort is normal.     Breath sounds: Normal breath sounds.  Abdominal:     Palpations: Abdomen is soft.  Musculoskeletal:     Right shoulder: Tenderness and bony tenderness present. Decreased range of motion. Decreased strength.     Cervical back: Neck supple.  Neurological:     Mental Status: She is alert and oriented to person, place, and  time.  Psychiatric:        Behavior: Behavior normal.      Assessment/Plan Right shoulder full-thickness rotator cuff tear  Our plan will be to proceed to surgery today for right shoulder arthroscopy with debridement, subacromial decompression and an attempted rotator cuff repair depending on the nature of her collagen fibers and the degree of retraction of the rotator cuff.  The.  Risks and benefits of surgery have been explained in detail and informed consent is obtained.   Mcarthur Rossetti, MD 08/12/2020, 7:18 AM

## 2020-08-12 NOTE — Brief Op Note (Signed)
08/12/2020  10:00 AM  PATIENT:  Denise Morales  60 y.o. female  PRE-OPERATIVE DIAGNOSIS:  right shoulder full thickness rotator cuff tear  POST-OPERATIVE DIAGNOSIS:  right shoulder full thickness rotator cuff tear  PROCEDURE:  Procedure(s): RIGHT SHOULDER ARTHROSCOPY WITH ROTATOR CUFF REPAIR AND SUBACROMIAL DECOMPRESSION (Right)  SURGEON:  Surgeon(s) and Role:    Mcarthur Rossetti, MD - Primary  PHYSICIAN ASSISTANT:  Benita Stabile, PA-C  ANESTHESIA:   regional and general  EBL:  35 mL   COUNTS:  YES  PLAN OF CARE: Discharge to home after PACU  PATIENT DISPOSITION:  PACU - hemodynamically stable.   Delay start of Pharmacological VTE agent (>24hrs) due to surgical blood loss or risk of bleeding: no

## 2020-08-12 NOTE — Anesthesia Procedure Notes (Signed)
Anesthesia Regional Block: Interscalene brachial plexus block   Pre-Anesthetic Checklist: ,, timeout performed, Correct Patient, Correct Site, Correct Laterality, Correct Procedure, Correct Position, site marked, Risks and benefits discussed,  Surgical consent,  Pre-op evaluation,  At surgeon's request and post-op pain management  Laterality: Right  Prep: chloraprep       Needles:  Injection technique: Single-shot  Needle Type: Stimiplex     Needle Length: 9cm  Needle Gauge: 21     Additional Needles:   Procedures:,,,, ultrasound used (permanent image in chart),,,,  Narrative:  Start time: 08/12/2020 7:55 AM End time: 08/12/2020 8:00 AM Injection made incrementally with aspirations every 5 mL.  Performed by: Personally  Anesthesiologist: Lynda Rainwater, MD

## 2020-08-12 NOTE — Op Note (Signed)
NAMEJINNA, Denise Morales MEDICAL RECORD [id number] ACCOUNT [id number] DATE OF BIRTH:1961-03-04 FACILITY: [facility] LOCATION: [facility] PHYSICIAN:Naviyah Schaffert Kerry Fort, MD  OPERATIVE REPORT  DATE OF PROCEDURE:  08/12/2020  PREOPERATIVE DIAGNOSIS:  Right shoulder full thickness and retracted rotator cuff tear.  POSTOPERATIVE DIAGNOSIS:  Right shoulder full thickness and retracted rotator cuff tear.  PROCEDURE:  Left shoulder arthroscopy with debridement and arthroscopically assisted rotator cuff repair.  IMPLANTS:  Arthrex 4.75 suture anchors x2 and FiberTape suture.  FINDINGS:  Large supraspinatus tendon tear of the right rotator cuff with retraction.  SURGEON:  Lind Guest. [name], MD  ASSISTANT:  Erskine Emery, PA-C.  ANESTHESIA:   1.  Regional right shoulder block.   2.  General.  ESTIMATED BLOOD LOSS:  Minimal.  COMPLICATIONS:  None.  INDICATIONS:  The patient is a 60 year old female with a full thickness retracted rotator cuff tear.  This has been more of an acute tear with no muscle atrophy on the MRI.  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  Due to limited shoulder motion and significant weakness as well as an MRI showing a full-thickness retractor rotator cuff tear, we recommended arthroscopic intervention for her shoulder.  The risks and benefits of this were  explained to her in detail and she did wish to proceed.  DESCRIPTION OF PROCEDURE:  After informed consent was obtained and appropriate right shoulder was marked.  Anesthesia obtained a regional right shoulder block in the holding room.  She was then brought to the operating room and placed supine on the  operating table.  General anesthesia was then obtained.  She was then fashioned in a beach chair position with appropriate positioning of the head, neck and padding of the down nonoperative left arm.  There was bending at the waist and knees and  palpable  pulses in her feet.  Sequential compression devices were placed on both legs.  Right shoulder was prepped and draped with DuraPrep and sterile drapes.  A time-out was called and she was identified as correct patient, correct right shoulder.  We then  placed her shoulder and in line skeletal traction using a fishing pole traction device and 45 degrees of forward flexion with neutral rotation and 10 pounds of traction.  I then made a posterolateral arthroscopy portal and inserted a cannula into the  shoulder and inserted the camera into the shoulder right away, we could see that there was a full-thickness rotator cuff tear.  We made an anterior portal and carried out a minimal debridement in the glenohumeral joint.  We found intact cartilage in the  glenoid face as well as the humeral head.  We then entered the subacromial space through the posterior portal and made 2 separate lateral portals.  We were able to find a full thickness retracted cuff.  We used the rotator cuff grabber/grasper and pulled  the rotator cuff back down to the footprint.  Fortunately, the integrity of her rotator cuff tissue was intact.  We then placed an Arthrex suture anchor in the footprint of the greater tuberosity of the shoulder.  We then had several FiberWire sutures  come out of this and we placed them in a horizontal format through the supraspinatus tendon.  We then secured these through a separate anchor in the subdeltoid region just lateral to this and tightened this down to bring the rotator cuff over to a good  resting position nice and tight.  We  then cut those sutures and put the shoulder through internal and external rotation and the rotator cuff and the humeral head removed as a unit.  We then debrided some more tissue in the subacromial space and then  removed all instrumentation from the shoulder.  We closed all 5 portal sites with interrupted nylon suture.  Xeroform well-padded sterile dressing was applied.   Her shoulder was placed in abduction pillow sling.  She was awakened, extubated, and taken to  recovery room in stable condition with all final counts being correct.  There were no complications noted.  Postoperatively, we will put her through rotator cuff protocol for recovery of her rotator cuff.  We will give her discharge instructions and  wound care instructions as well.  Of note, Benita Stabile, PA-C did assist during the entire case and assistance was crucial from the beginning until the end of this case.  HN/NUANCE  D:08/12/2020 T:08/12/2020 JOB:[id number]

## 2020-08-12 NOTE — Transfer of Care (Signed)
Immediate Anesthesia Transfer of Care Note  Patient: Denise Morales  Procedure(s) Performed: RIGHT SHOULDER ARTHROSCOPY WITH ROTATOR CUFF REPAIR AND SUBACROMIAL DECOMPRESSION (Right Shoulder)  Patient Location: PACU  Anesthesia Type:General and Regional  Level of Consciousness: awake, alert  and oriented  Airway & Oxygen Therapy: Patient Spontanous Breathing and Patient connected to face mask oxygen  Post-op Assessment: Report given to RN and Post -op Vital signs reviewed and stable  Post vital signs: Reviewed and stable  Last Vitals:  Vitals Value Taken Time  BP    Temp    Pulse 99 08/12/20 1013  Resp    SpO2 94 % 08/12/20 1013  Vitals shown include unvalidated device data.  Last Pain:  Vitals:   08/12/20 0714  TempSrc: Oral  PainSc: 0-No pain         Complications: No complications documented.

## 2020-08-12 NOTE — Progress Notes (Signed)
Assisted Dr. Miller with right, ultrasound guided, interscalene  block. Side rails up, monitors on throughout procedure. See vital signs in flow sheet. Tolerated Procedure well. 

## 2020-08-16 ENCOUNTER — Encounter (HOSPITAL_BASED_OUTPATIENT_CLINIC_OR_DEPARTMENT_OTHER): Payer: Self-pay | Admitting: Orthopaedic Surgery

## 2020-08-19 ENCOUNTER — Other Ambulatory Visit: Payer: Self-pay

## 2020-08-19 ENCOUNTER — Encounter: Payer: Self-pay | Admitting: Orthopaedic Surgery

## 2020-08-19 ENCOUNTER — Ambulatory Visit (INDEPENDENT_AMBULATORY_CARE_PROVIDER_SITE_OTHER): Payer: Medicaid Other | Admitting: Orthopaedic Surgery

## 2020-08-19 DIAGNOSIS — S46011D Strain of muscle(s) and tendon(s) of the rotator cuff of right shoulder, subsequent encounter: Secondary | ICD-10-CM

## 2020-08-19 DIAGNOSIS — Z9889 Other specified postprocedural states: Secondary | ICD-10-CM

## 2020-08-19 NOTE — Progress Notes (Signed)
The patient is 1 week status post a right shoulder arthroscopy and arthroscopically assisted rotator cuff repair.  She is 60 years old and very active.  She is only taking Tylenol for pain.  Examination of her right shoulder shows is well located.  Her axillary nerve function is intact.  I removed all the sutures.  I did show her her arthroscopy pictures and described what we did to her right shoulder.  It is essential now we get her through outpatient physical therapy.  We will order that for her.  She will continue the sling with coming in and out of it for comfort but not doing any type of shoulder abduction or reaching behind her on the right side until further notice.  We will order outpatient physical therapy within the [facility] system and will be in touch about getting her over there.  I will see her back in 4 weeks to see how she doing overall.

## 2020-08-23 ENCOUNTER — Other Ambulatory Visit: Payer: Self-pay | Admitting: Family Medicine

## 2020-08-23 ENCOUNTER — Telehealth: Payer: Self-pay

## 2020-08-23 ENCOUNTER — Telehealth (INDEPENDENT_AMBULATORY_CARE_PROVIDER_SITE_OTHER): Payer: Medicaid Other | Admitting: Primary Care

## 2020-08-23 ENCOUNTER — Ambulatory Visit (INDEPENDENT_AMBULATORY_CARE_PROVIDER_SITE_OTHER): Payer: Self-pay | Admitting: Physical Therapy

## 2020-08-23 ENCOUNTER — Encounter (INDEPENDENT_AMBULATORY_CARE_PROVIDER_SITE_OTHER): Payer: Self-pay | Admitting: Primary Care

## 2020-08-23 ENCOUNTER — Encounter: Payer: Self-pay | Admitting: Physical Therapy

## 2020-08-23 ENCOUNTER — Other Ambulatory Visit: Payer: Self-pay

## 2020-08-23 DIAGNOSIS — M25611 Stiffness of right shoulder, not elsewhere classified: Secondary | ICD-10-CM

## 2020-08-23 DIAGNOSIS — F4323 Adjustment disorder with mixed anxiety and depressed mood: Secondary | ICD-10-CM

## 2020-08-23 DIAGNOSIS — R600 Localized edema: Secondary | ICD-10-CM

## 2020-08-23 DIAGNOSIS — M25511 Pain in right shoulder: Secondary | ICD-10-CM

## 2020-08-23 DIAGNOSIS — M6281 Muscle weakness (generalized): Secondary | ICD-10-CM

## 2020-08-23 NOTE — Patient Instructions (Signed)
Access Code: [email or web address] URL: [email or web address] Date: 08/23/2020 Prepared by: Elsie Ra  Exercises Circular Shoulder Pendulum with Table Support - 2 x daily - 6 x weekly - 1-2 sets - 10 reps Seated Scapular Retraction - 2 x daily - 6 x weekly - 1-2 sets - 10 reps Seated Single Arm Bicep Curls with Rotation and Dumbbell - 2 x daily - 6 x weekly - 1-2 sets - 10 reps Towel Roll Squeeze - 2 x daily - 6 x weekly - 1-2 sets - 10 reps - 5 hold

## 2020-08-23 NOTE — Therapy (Unsigned)
[facility] [address] [place], [place], [address] Phone: [phone]   Fax:  [phone]  Physical Therapy Evaluation  Patient Details  Name: Denise Morales MRN: [id number] Date of Birth: 05-30-1961 Referring Provider (PT): Jeronimo Norma, MD   Encounter Date: 08/23/2020   PT End of Session - 08/23/20 7026    Visit Number 1    Number of Visits 32    Date for PT Re-Evaluation 12/13/20    Progress Note Due on Visit 10    PT Start Time 1603    PT Stop Time 3785    PT Time Calculation (min) 41 min    Equipment Utilized During Treatment Other (comment)   Sling   Activity Tolerance Patient tolerated treatment well;Patient limited by pain    Behavior During Therapy [facility] for tasks assessed/performed           Past Medical History:  Diagnosis Date  . Depression   . GERD (gastroesophageal reflux disease)   . Hypertension     Past Surgical History:  Procedure Laterality Date  . ABDOMINAL HYSTERECTOMY  1985  . CHOLECYSTECTOMY    . SHOULDER ARTHROSCOPY WITH ROTATOR CUFF REPAIR AND SUBACROMIAL DECOMPRESSION Right 08/12/2020   Procedure: RIGHT SHOULDER ARTHROSCOPY WITH ROTATOR CUFF REPAIR AND SUBACROMIAL DECOMPRESSION;  Surgeon: Mcarthur Rossetti, MD;  Location: [place] [facility];  Service: Orthopedics;  Laterality: Right;    There were no vitals filed for this visit.    Subjective Assessment - 08/23/20 1557    Subjective Patient is a 60 y/o female 11 days s/p Rt. Rotator cuff repair. Patient states she is sleeping well and pain is minimal. She has been taking off her sling for a few hours in the morning but wears in during the day and for sleeping.  She is mostly taking tylenol for pain releif and sometimes oxycodone when needed. She has been doing pendulums and has been able to do dress and groom with medium assistance from her husband. She does have some N/T in her lateral thumb at times.    Patient is accompained by:  Family member   [name], husband   Pertinent History HTN    Limitations House hold activities;Lifting    Diagnostic tests MRI 06/14/2020 R shoulder: Full-thickness full width rupture of the supraspinatus tendon  retracted back 1.6 cm. No muscular atrophy or edema.  2. Intrasubstance partial thickness tearing of the infraspinatus  tendon along the myotendinous junction.  3. Mild subscapularis tendinopathy.  4. 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    Patient Stated Goals be able to use her arm, lift things, dress and groom herself etc.    Currently in Pain? Yes    Pain Score 3     Pain Location Shoulder    Pain Orientation Right;Lateral    Pain Descriptors / Indicators Aching;Tingling    Pain Type Surgical pain    Pain Radiating Towards lateral shoulder, N/T in thumb at times    Pain Onset 1 to 4 weeks ago    Pain Relieving Factors tylenol or oxycontin              [name] PT Assessment - 08/23/20 0001      Assessment   Medical Diagnosis Rt. Rotator cuff repair    Referring Provider (PT) Jeronimo Norma, MD    Onset Date/Surgical Date 08/12/20    Hand Dominance Right    Next MD Visit 09/16/2020    Prior Therapy prior therapy for shoulder  Precautions   Precautions Shoulder    Type of Shoulder Precautions no active motion      Restrictions   Weight Bearing Restrictions No      Balance Screen   Has the patient fallen in the past 6 months Yes    How many times? 1    Has the patient had a decrease in activity level because of a fear of falling?  No    Is the patient reluctant to leave their home because of a fear of falling?  No      Home Environment   Living Environment Private residence      Prior Function   Level of Independence Needs assistance with ADLs    Vocation Retired      Associate Professor   Overall Cognitive Status Within Functional Limits for tasks assessed      Observation/Other Assessments   Observations some bruising and swelling  Rt. shoulder    Skin Integrity incisions healing well    Focus on Therapeutic Outcomes (FOTO)  functional intake score 32%, predicted 59%      ROM / Strength   AROM / PROM / Strength PROM;Strength      PROM   PROM Assessment Site Shoulder    Right/Left Shoulder Right    Right Shoulder Flexion 30 Degrees    Right Shoulder ABduction 90 Degrees    Right Shoulder Internal Rotation 60 Degrees    Right Shoulder External Rotation 38 Degrees      Strength   Strength Assessment Site Shoulder    Right/Left Shoulder Left    Left Shoulder Flexion 3-/5    Left Shoulder Extension 4/5    Left Shoulder ABduction 3-/5    Left Shoulder Internal Rotation 4/5    Left Shoulder External Rotation 4/5          Did not test Rt shoulder strength due to post op precautions            Objective measurements completed on examination: See above findings.       [facility] PT Treatment/Exercise - 08/23/20 0001      Modalities   Modalities Vasopneumatic      Vasopneumatic   Number Minutes Vasopneumatic  10 minutes    Vasopnuematic Location  Shoulder    Vasopneumatic Pressure Medium    Vasopneumatic Temperature  34      Manual Therapy   Manual Therapy Passive ROM    Passive ROM flex, abd, ER, IR to patient tolerance                  PT Education - 08/23/20 1558    Education Details HEP, POC, shoulder surgery precautions    Person(s) Educated Patient    Methods Explanation;Handout;Demonstration    Comprehension Verbalized understanding;Returned demonstration            PT Short Term Goals - 08/23/20 1709      PT SHORT TERM GOAL #1   Title Patient will be I with initial progression of HEP    Time 4    Period Weeks    Status New    Target Date 09/21/20      PT SHORT TERM GOAL #2   Title Patient will demonstrate improved Rt shoulder PROM    Baseline see assessment    Time 4    Period Weeks    Status New    Target Date 09/21/20             PT Long Term  Goals - 08/23/20 1711      PT LONG TERM GOAL #1   Title Patient will show improved FOTO score to above 59%    Baseline 32 baseline    Time 16    Period Weeks    Target Date 12/13/20      PT LONG TERM GOAL #2   Title Patient will demonstrate improved Rt shoulder strength to be 4+/5 for better ability to function with ADLs    Baseline unable to assess patient strength due to AROM limitations    Time 16    Period Weeks    Status New    Target Date 12/13/20      PT LONG TERM GOAL #3   Title Patient will demonstrate AROM that is [facility] for ADLs    Baseline no AROM due to precautions    Time 16    Period Days    Status New    Target Date 12/13/20      PT LONG TERM GOAL #4   Title Patient will demonstrate ability to perform daily tasks without restrictions of motion or pain increasing over 2/10    Baseline rest pain 3/10    Time 16    Period Weeks    Status New    Target Date 12/13/20      PT LONG TERM GOAL #5   Title Patient will be I in her long term HEP for continued strengthening and maintenence of movement    Time 16    Period Weeks    Status New    Target Date 12/13/20                  Plan - 08/23/20 1716    Clinical Impression Statement Patient is 11 days s/p Rt shoulder Rotator cuff repair. Patient has deficits in motion and strength as she is unable to move actively due to surgical precuations. She has slight swelling and is only able to complete ADLs with assistance from her husband. Patient can benefit from skilled PT to initiate passive motion and prepare her for AAROM in the coming weeks.    Examination-Activity Limitations Bathing;Dressing;Hygiene/Grooming;Carry    Examination-Participation Restrictions Driving;Community Activity;Cleaning;Shop;Meal Prep;Laundry    Stability/Clinical Decision Making Stable/Uncomplicated    Clinical Decision Making Low    Rehab Potential Good    PT Frequency 2x / week    PT Duration Other (comment)   16 weeks   PT  Treatment/Interventions ADLs/Self Care Home Management;Aquatic Therapy;Taping;Vasopneumatic Device;Dry needling;Passive range of motion;Scar mobilization;Patient/family education;Manual techniques;Neuromuscular re-education;Therapeutic exercise;Therapeutic activities;Functional mobility training;Ultrasound;Moist Heat;Iontophoresis 4mg /ml Dexamethasone;Electrical Stimulation;Cryotherapy;Biofeedback    PT Next Visit Plan PROM, mobs, check in HEP/pain, monitor for BFR canidate    PT Home Exercise Plan Access Code: [email or web address]    Consulted and Agree with Plan of Care Patient           Patient will benefit from skilled therapeutic intervention in order to improve the following deficits and impairments:  Decreased range of motion,Impaired UE functional use,Pain,Impaired flexibility,Increased edema,Decreased strength,Decreased mobility  Visit Diagnosis: Acute pain of right shoulder  Stiffness of right shoulder, not elsewhere classified  Localized edema  Muscle weakness (generalized)     Problem List Patient Active Problem List   Diagnosis Date Noted  . Complete tear of right rotator cuff 08/12/2020  . Hypertensive disorder 05/05/2019    Glenetta Hew, SPT 08/23/2020, 5:23 PM   During this treatment session, this physical therapist was present, participating in and directing the treatment.   This note has  been reviewed and this clinician agrees with the information provided.  Elsie Ra, PT, DPT 08/24/20 8:16 AM   [facility] Physical Therapy [address] [place], [place], [address] Phone: [phone]   Fax:  [phone]  Name: Azaria Stegman MRN: [id number] Date of Birth: 07/02/1960

## 2020-08-23 NOTE — Progress Notes (Signed)
Virtual Visit via Telephone Note  I connected with Denise Morales on 08/23/20 at 10:30 AM EST by telephone and verified that I am speaking with the correct person using two identifiers.  Location: Patient:home  Provider: Kerin Perna @[facility]    I discussed the limitations, risks, security and privacy concerns of performing an evaluation and management service by telephone and the availability of in person appointments. I also discussed with the patient that there may be a patient responsible charge related to this service. The patient expressed understanding and agreed to proceed.   History of Present Illness: [name] is a 60 year old female having a follow up for depression and medication effectiveness of medication start approximately a month ago . She feels it is working no longer just wanting to do nothing but has energy and drive to engage in activities. Sleeping well at night until pain awakens her. Recently had surgery Status post arthroscopy of right shoulder admits sometimes she does get frustrated because she unable to do something but over all feels a lot better    Past Medical History:  Diagnosis Date  . Depression   . GERD (gastroesophageal reflux disease)   . Hypertension    Current Outpatient Medications on File Prior to Visit  Medication Sig Dispense Refill  . acetaminophen (TYLENOL) 500 MG tablet Take 1,000 mg by mouth every 6 (six) hours as needed.    . baclofen (LIORESAL) 10 MG tablet TAKE 1/2 TO 1 (ONE-HALF TO ONE) TABLET BY MOUTH THREE TIMES DAILY AS NEEDED FOR MUSCLE SPASMS 30 tablet 0  . Cholecalciferol (VITAMIN D-3) 125 MCG (5000 UT) TABS Take 1 tablet by mouth daily. 90 tablet 3  . escitalopram (LEXAPRO) 20 MG tablet Take 1 tablet (20 mg total) by mouth daily. 90 tablet 1  . lisinopril-hydrochlorothiazide (ZESTORETIC) 10-12.5 MG tablet Take 1 tablet by mouth once daily 90 tablet 0  . Menatetrenone (VITAMIN K2) 100 MCG TABS Take 100 mcg by mouth  daily. 90 tablet 3  . nabumetone (RELAFEN) 500 MG tablet Take 1 tablet by mouth twice daily as needed 60 tablet 0  . omeprazole (PRILOSEC) 40 MG capsule Take 1 capsule (40 mg total) by mouth daily. 90 capsule 1  . oxyCODONE (ROXICODONE) 5 MG immediate release tablet Take 1 tablet (5 mg total) by mouth every 4 (four) hours as needed for severe pain. 30 tablet 0  . pravastatin (PRAVACHOL) 40 MG tablet Take 1 tablet (40 mg total) by mouth daily. 90 tablet 3   No current facility-administered medications on file prior to visit.   Observations/Objective: There were no vitals taken for this visit.  Pertinent positive and negative noted in HPI  Assessment and Plan: Amila was seen today for depression.  Diagnoses and all orders for this visit:  Adjustment disorder with mixed anxiety and depressed mood Place on lexapro 20mg  4 weeks ago medication follow up. She feels better noticed a difference. Our conversation today was up beat laughing definitely  difference from our last visit.   Follow Up Instructions:    I discussed the assessment and treatment plan with the patient. The patient was provided an opportunity to ask questions and all were answered. The patient agreed with the plan and demonstrated an understanding of the instructions.   The patient was advised to call back or seek an in-person evaluation if the symptoms worsen or if the condition fails to improve as anticipated.  I provided 15 minutes of non-face-to-face time during this encounter.  Kerin Perna, NP

## 2020-08-23 NOTE — Progress Notes (Signed)
I connected with  Denise Morales on 08/23/20 by a video enabled telemedicine application and verified that I am speaking with the correct person using two identifiers.   I discussed the limitations of evaluation and management by telemedicine. The patient expressed understanding and agreed to proceed.

## 2020-08-23 NOTE — Telephone Encounter (Signed)
Received approval letter and PRF from [employer] for Monovisc, bilateral knee. Faxed completed PRF to [employer] at [phone]. 

## 2020-08-24 ENCOUNTER — Encounter: Payer: Self-pay | Admitting: Physical Therapy

## 2020-08-25 ENCOUNTER — Other Ambulatory Visit: Payer: Self-pay

## 2020-08-25 ENCOUNTER — Emergency Department (HOSPITAL_COMMUNITY): Payer: Self-pay

## 2020-08-25 ENCOUNTER — Emergency Department (HOSPITAL_COMMUNITY)
Admission: EM | Admit: 2020-08-25 | Discharge: 2020-08-25 | Disposition: A | Discharge status: Home / Self Care | Payer: Self-pay | Attending: Emergency Medicine | Admitting: Emergency Medicine

## 2020-08-25 DIAGNOSIS — M25461 Effusion, right knee: Secondary | ICD-10-CM | POA: Insufficient documentation

## 2020-08-25 DIAGNOSIS — Z79899 Other long term (current) drug therapy: Secondary | ICD-10-CM | POA: Insufficient documentation

## 2020-08-25 DIAGNOSIS — M25561 Pain in right knee: Secondary | ICD-10-CM

## 2020-08-25 DIAGNOSIS — I10 Essential (primary) hypertension: Secondary | ICD-10-CM | POA: Insufficient documentation

## 2020-08-25 IMAGING — CR DG KNEE 1-2V*R*
2 series · 2 of 2 positions shown · non-contrast
Comparison: None.

CLINICAL DATA: Knee pain.  No specific injury.

EXAM:
RIGHT KNEE - 1-2 VIEW

[t knee ap right]
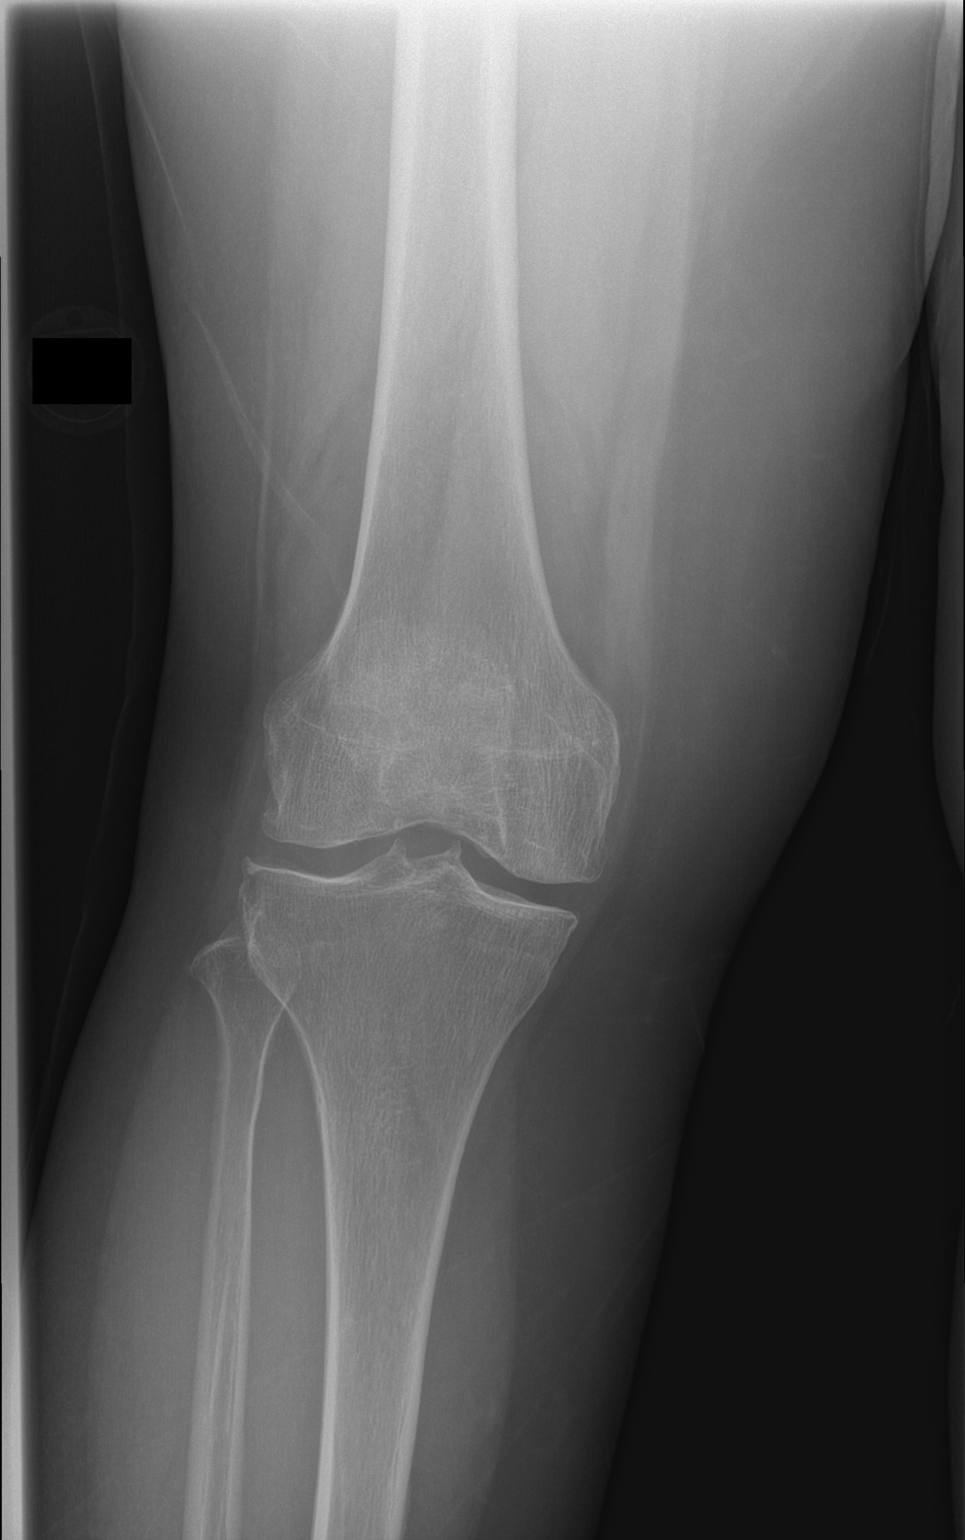

[x knee lat right]
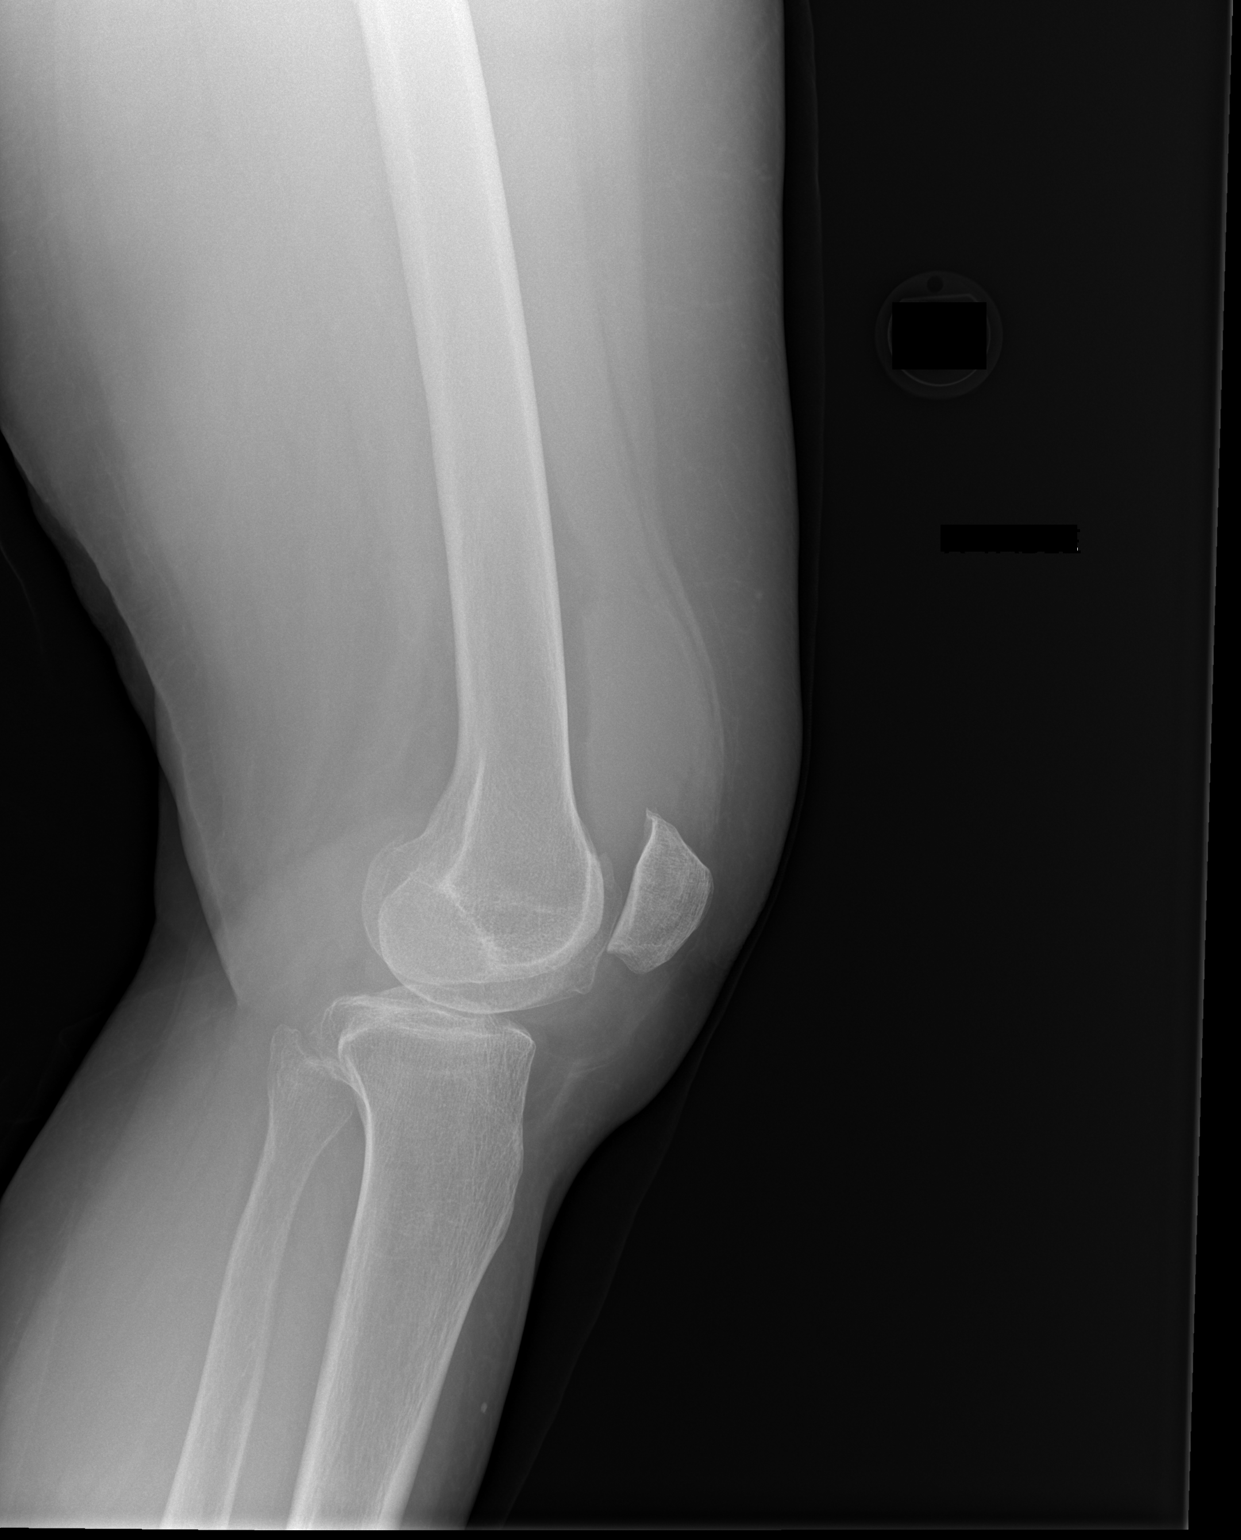

[2 of 2 positions shown; findings below may reference images not displayed]

FINDINGS: Osseous alignment is normal. No fracture line or displaced fracture
fragment. No acute appearing cortical irregularity or osseous
lesion. No degenerative change seen.

Joint effusion within the suprapatellar bursa, moderate to large in
size. Superficial soft tissues are unremarkable.
IMPRESSION: 1. Joint effusion, moderate to large in size.
2. No osseous abnormality. No degenerative change seen.

## 2020-08-25 MED ORDER — ACETAMINOPHEN 325 MG PO TABS
650.0000 mg | ORAL_TABLET | Freq: Once | ORAL | Status: AC
  Administered 2020-08-25: 650 mg via ORAL
  Filled 2020-08-25: qty 2

## 2020-08-25 MED ORDER — TIZANIDINE HCL 4 MG PO TABS
2.0000 mg | ORAL_TABLET | Freq: Once | ORAL | Status: AC
  Administered 2020-08-25: 2 mg via ORAL
  Filled 2020-08-25: qty 1

## 2020-08-25 MED ORDER — TIZANIDINE HCL 4 MG PO TABS: 4.0000 mg | ORAL_TABLET | Freq: Three times a day (TID) | ORAL | 0 refills | Status: DC | PRN

## 2020-08-25 MED ORDER — MORPHINE SULFATE (PF) 4 MG/ML IV SOLN
4.0000 mg | Freq: Once | INTRAVENOUS | Status: AC
  Administered 2020-08-25: 4 mg via INTRAMUSCULAR
  Filled 2020-08-25: qty 1

## 2020-08-25 MED ORDER — OXYCODONE-ACETAMINOPHEN 5-325 MG PO TABS: 1.0000 | ORAL_TABLET | Freq: Four times a day (QID) | ORAL | 0 refills | Status: DC | PRN

## 2020-08-25 NOTE — ED Provider Notes (Signed)
[facility] DEPT Provider Note   CSN: [id number] Arrival date & time: 08/25/20  1433     History Chief Complaint  Patient presents with  . Knee Pain    Denise Morales is a 60 y.o. female.  Patient presents chief complaint of right knee pain.  States that she said intermittent right knee pain for several days now.  She had a loss in the family and was at her sister's bedside today in the hospital.  When she tried to stand up from a sitting position she had severe right knee pain and was unable to weight-bear.  Sent to the ER for further evaluation.  Denies fall or other trauma.  Denies illnesses such as fevers cough vomiting or diarrhea.  Denies headache or chest pain or shortness of breath.        Past Medical History:  Diagnosis Date  . Depression   . GERD (gastroesophageal reflux disease)   . Hypertension     Patient Active Problem List   Diagnosis Date Noted  . Complete tear of right rotator cuff 08/12/2020  . Hypertensive disorder 05/05/2019    Past Surgical History:  Procedure Laterality Date  . ABDOMINAL HYSTERECTOMY  1985  . CHOLECYSTECTOMY    . SHOULDER ARTHROSCOPY WITH ROTATOR CUFF REPAIR AND SUBACROMIAL DECOMPRESSION Right 08/12/2020   Procedure: RIGHT SHOULDER ARTHROSCOPY WITH ROTATOR CUFF REPAIR AND SUBACROMIAL DECOMPRESSION;  Surgeon: Mcarthur Rossetti, MD;  Location: [facility];  Service: Orthopedics;  Laterality: Right;     OB History   No obstetric history on file.     Family History  Problem Relation Age of Onset  . Diabetes Father   . Diabetes Sister   . Diabetes Paternal Aunt     Social History   Tobacco Use  . Smoking status: Never Smoker  . Smokeless tobacco: Never Used  Substance Use Topics  . Alcohol use: Not Currently  . Drug use: Never    Home Medications Prior to Admission medications   Medication Sig Start Date End Date Taking? Authorizing Provider  oxyCODONE-acetaminophen  (PERCOCET/ROXICET) 5-325 MG tablet Take 1 tablet by mouth every 6 (six) hours as needed for severe pain. 08/25/20  Yes Lainie Daubert, Greggory Brandy, MD  tiZANidine (ZANAFLEX) 4 MG tablet Take 1 tablet (4 mg total) by mouth every 8 (eight) hours as needed for up to 12 doses for muscle spasms. 08/25/20  Yes Luna Fuse, MD  acetaminophen (TYLENOL) 500 MG tablet Take 1,000 mg by mouth every 6 (six) hours as needed.    [provider]  baclofen (LIORESAL) 10 MG tablet TAKE 1/2 TO 1 (ONE-HALF TO ONE) TABLET BY MOUTH THREE TIMES DAILY AS NEEDED FOR MUSCLE SPASMS 08/23/20   Hilts, Legrand Como, MD  Cholecalciferol (VITAMIN D-3) 125 MCG (5000 UT) TABS Take 1 tablet by mouth daily. 04/05/20   Hilts, Legrand Como, MD  escitalopram (LEXAPRO) 20 MG tablet Take 1 tablet (20 mg total) by mouth daily. 07/20/20   Kerin Perna, NP  lisinopril-hydrochlorothiazide (ZESTORETIC) 10-12.5 MG tablet Take 1 tablet by mouth once daily 06/30/20   Kerin Perna, NP  Menatetrenone (VITAMIN K2) 100 MCG TABS Take 100 mcg by mouth daily. 04/05/20   Hilts, Legrand Como, MD  nabumetone (RELAFEN) 500 MG tablet Take 1 tablet by mouth twice daily as needed 08/09/20   Hilts, Michael, MD  omeprazole (PRILOSEC) 40 MG capsule Take 1 capsule (40 mg total) by mouth daily. 07/20/20   Kerin Perna, NP  oxyCODONE (ROXICODONE) 5  MG immediate release tablet Take 1 tablet (5 mg total) by mouth every 4 (four) hours as needed for severe pain. 08/12/20   Mcarthur Rossetti, MD  pravastatin (PRAVACHOL) 40 MG tablet Take 1 tablet (40 mg total) by mouth daily. 03/31/20   Kerin Perna, NP    Allergies    Patient has no known allergies.  Review of Systems   Review of Systems  Constitutional: Negative for fever.  HENT: Negative for ear pain.   Eyes: Negative for pain.  Respiratory: Negative for cough.   Cardiovascular: Negative for chest pain.  Gastrointestinal: Negative for abdominal pain.  Genitourinary: Negative for flank pain.   Musculoskeletal: Negative for back pain.  Skin: Negative for rash.  Neurological: Negative for headaches.    Physical Exam Updated Vital Signs BP (!) 158/97   Pulse 96   Temp 98.3 F (36.8 C) (Oral)   Resp 20   SpO2 98%   Physical Exam Constitutional:      General: She is not in acute distress.    Appearance: Normal appearance.  HENT:     Head: Normocephalic.     Nose: Nose normal.  Eyes:     Extraocular Movements: Extraocular movements intact.  Cardiovascular:     Rate and Rhythm: Normal rate.  Pulmonary:     Effort: Pulmonary effort is normal.  Musculoskeletal:     Cervical back: Normal range of motion.     Comments: Tenderness palpation along the right knee joint.  Positive joint effusion noted with positive ballottement of the kneecap.  No abnormal warmth no cellulitis noted.  Patient has pain with attempted range of motion.  Able to flex about 10 to 15 degrees before having too much pain.  Compartments otherwise soft.  Neurological:     General: No focal deficit present.     Mental Status: She is alert. Mental status is at baseline.     ED Results / Procedures / Treatments   Labs (all labs ordered are listed, but only abnormal results are displayed) Labs Reviewed - No data to display  EKG None  Radiology DG Knee 2 Views Right  Result Date: 08/25/2020 CLINICAL DATA:  Knee pain.  No specific injury. EXAM: RIGHT KNEE - 1-2 VIEW COMPARISON:  None. FINDINGS: Osseous alignment is normal. No fracture line or displaced fracture fragment. No acute appearing cortical irregularity or osseous lesion. No degenerative change seen. Joint effusion within the suprapatellar bursa, moderate to large in size. Superficial soft tissues are unremarkable. IMPRESSION: 1. Joint effusion, moderate to large in size. 2. No osseous abnormality. No degenerative change seen. Electronically Signed   By: Franki Cabot M.D.   On: 08/25/2020 16:23    Procedures Procedures   Medications Ordered  in ED Medications  acetaminophen (TYLENOL) tablet 650 mg (650 mg Oral Given 08/25/20 1514)  morphine 4 MG/ML injection 4 mg (4 mg Intramuscular Given 08/25/20 1514)  tiZANidine (ZANAFLEX) tablet 2 mg (2 mg Oral Given 08/25/20 1604)    ED Course  I have reviewed the triage vital signs and the nursing notes.  Pertinent labs & imaging results that were available during my care of the patient were reviewed by me and considered in my medical decision making (see chart for details).    MDM Rules/Calculators/A&P                          Given history and clinical exam, I doubt septic joint or knee dislocation.  No  evidence of cellulitis on exam.  X-ray showed no evidence of fracture or avulsion or dislocation.  Patient placed in a knee immobilizer and crutch training provided.  Will advise outpatient follow-up with orthopedics within the week.  Advised immediate return for worsening pain or any additional concerns.     Final Clinical Impression(s) / ED Diagnoses Final diagnoses:  Acute pain of right knee  Effusion of right knee    Rx / DC Orders ED Discharge Orders         Ordered    tiZANidine (ZANAFLEX) 4 MG tablet  Every 8 hours PRN        08/25/20 1647    oxyCODONE-acetaminophen (PERCOCET/ROXICET) 5-325 MG tablet  Every 6 hours PRN        08/25/20 1647           Luna Fuse, MD 08/25/20 [phone]

## 2020-08-25 NOTE — Discharge Instructions (Addendum)
Call your primary care doctor or specialist as discussed in the next 2-3 days.   Return immediately back to the ER if:  Your symptoms worsen within the next 12-24 hours. You develop new symptoms such as new fevers, persistent vomiting, new pain, shortness of breath, or new weakness or numbness, or if you have any other concerns.  

## 2020-08-25 NOTE — ED Notes (Signed)
Pt states right knee pain after mechanical fall this afternoon, states she believed her other leg locked up causing the issue.

## 2020-08-26 ENCOUNTER — Encounter: Payer: Self-pay | Admitting: Physician Assistant

## 2020-08-26 ENCOUNTER — Ambulatory Visit (INDEPENDENT_AMBULATORY_CARE_PROVIDER_SITE_OTHER): Payer: Self-pay | Admitting: Physician Assistant

## 2020-08-26 ENCOUNTER — Telehealth: Payer: Self-pay | Admitting: *Deleted

## 2020-08-26 DIAGNOSIS — M25061 Hemarthrosis, right knee: Secondary | ICD-10-CM

## 2020-08-26 DIAGNOSIS — M25461 Effusion, right knee: Secondary | ICD-10-CM

## 2020-08-26 MED ORDER — METHYLPREDNISOLONE ACETATE 40 MG/ML IJ SUSP
40.0000 mg | INTRAMUSCULAR | Status: AC | PRN
  Administered 2020-08-26: 40 mg via INTRA_ARTICULAR

## 2020-08-26 MED ORDER — LIDOCAINE HCL 1 % IJ SOLN
5.0000 mL | INTRAMUSCULAR | Status: AC | PRN
  Administered 2020-08-26: 5 mL

## 2020-08-26 NOTE — Progress Notes (Signed)
Office Visit Note   Patient: Denise Morales           Date of Birth: 1960/08/03           MRN: [id number] Visit Date: 08/26/2020                Requested by: Kerin Perna, NP [address] [place],  [place] [address] PCP: Kerin Perna, NP   Assessment & Plan: Visit Diagnoses:  1. Effusion, right knee   2. Hemarthrosis of right knee     Plan: She can be weightbearing as tolerated on the right knee in the knee immobilizer.  We will obtain an MRI of her knee to rule out internal derangement given the hemarthrosis.  She will follow up with [place] after the MRI to go over the results and discuss further treatment.  Follow-Up Instructions: Return After MRI.   Orders:  Orders Placed This Encounter  Procedures  . Large Joint Inj  . MR Knee Right w/o contrast  . Cell count + diff,  w/ cryst-synvl fld   No orders of the defined types were placed in this encounter.     Procedures: Large Joint Inj: R knee on 08/26/2020 5:07 PM Indications: pain Details: 22 G 1.5 in needle, superolateral approach  Arthrogram: No  Medications: 40 mg methylPREDNISolone acetate 40 MG/ML; 5 mL lidocaine 1 % Aspirate: 15 mL bloody Outcome: tolerated well, no immediate complications Procedure, treatment alternatives, risks and benefits explained, specific risks discussed. Consent was given by the patient. Immediately prior to procedure a time out was called to verify the correct patient, procedure, equipment, support staff and site/side marked as required. Patient was prepped and draped in the usual sterile fashion.       Clinical Data: No additional findings.   Subjective: Chief Complaint  Patient presents with  . Right Knee - Pain    HPI  Denise Morales is well-known to Dr. [name] service comes in today with new complaint of right knee pain.  States knee pain has been present for last few weeks.  Is becoming worse.  She has had no known injury.  She is in the ER yesterday  Synkayvite to her sister who unexpectedly passed away.  She developed locking issues with the knee with some catching sensation went to the ER to get it checked.  She notes she was unable to bear weight on the knee yesterday.  Radiographs of her knee were obtained yesterday.  These showed the right knee with no acute fracture, subluxation dislocation.  Moderate to large sized effusion is noted.  Mild patellofemoral changes.  Otherwise knee is well-preserved.  No bony abnormalities otherwise. She is placed in a knee immobilizer and told to follow-up with orthopedic surgery.  She is having difficulty ambulating due to the pain in the knee.   Review of Systems See HPI otherwise negative  Objective: Vital Signs: There were no vitals taken for this visit.  Physical Exam Constitutional:      Appearance: She is not ill-appearing or diaphoretic.  Neurological:     Mental Status: She is alert and oriented to person, place, and time.  Psychiatric:        Mood and Affect: Mood normal.     Ortho Exam Right knee any attempts at range of motion causes significant pain she is able to do straight leg raise.  She has tenderness over the patellofemoral tendon region.  There is no deficit in this area.  Not minimal tenderness along  medial lateral joint line.  There is no erythema or ecchymosis.  Positive effusion.  Unable to pain the adequate exam due to patient's pain but no gross instability valgus varus stressing and anterior drawer of the right knee.  Specialty Comments:  No specialty comments available.  Imaging: No results found.   PMFS History: Patient Active Problem List   Diagnosis Date Noted  . Complete tear of right rotator cuff 08/12/2020  . Hypertensive disorder 05/05/2019   Past Medical History:  Diagnosis Date  . Depression   . GERD (gastroesophageal reflux disease)   . Hypertension     Family History  Problem Relation Age of Onset  . Diabetes Father   . Diabetes Sister   .  Diabetes Paternal Aunt     Past Surgical History:  Procedure Laterality Date  . ABDOMINAL HYSTERECTOMY  1985  . CHOLECYSTECTOMY    . SHOULDER ARTHROSCOPY WITH ROTATOR CUFF REPAIR AND SUBACROMIAL DECOMPRESSION Right 08/12/2020   Procedure: RIGHT SHOULDER ARTHROSCOPY WITH ROTATOR CUFF REPAIR AND SUBACROMIAL DECOMPRESSION;  Surgeon: Mcarthur Rossetti, MD;  Location: [facility];  Service: Orthopedics;  Laterality: Right;   Social History   Occupational History  . Not on file  Tobacco Use  . Smoking status: Never Smoker  . Smokeless tobacco: Never Used  Substance and Sexual Activity  . Alcohol use: Not Currently  . Drug use: Never  . Sexual activity: Not Currently

## 2020-08-26 NOTE — Telephone Encounter (Signed)
Transition Care Management Follow-up Telephone Call  Date of discharge and from where: 08/25/2020 - [facility]  How have you been since you were released from the hospital? "Still having pain in my knee"  Any questions or concerns? No  Items Reviewed:  Did the pt receive and understand the discharge instructions provided? Yes   Medications obtained and verified? Yes   Other? No   Any new allergies since your discharge? No   Dietary orders reviewed? Yes  Do you have support at home? Yes   Home Care and Equipment/Supplies: Were home health services ordered? not applicable If so, what is the name of the agency? N/A  Has the agency set up a time to come to the patient's home? not applicable Were any new equipment or medical supplies ordered?  No What is the name of the medical supply agency? N/A Were you able to get the supplies/equipment? not applicable Do you have any questions related to the use of the equipment or supplies? No  Functional Questionnaire: (I = Independent and D = Dependent) ADLs: I  Bathing/Dressing- I  Meal Prep- I  Eating- I  Maintaining continence- I  Transferring/Ambulation- I  Managing Meds- I  Follow up appointments reviewed:   PCP Hospital f/u appt confirmed? No    Specialist Hospital f/u appt confirmed? Yes  Scheduled to see Ortho on 08/26/2020 @ 1400.  Are transportation arrangements needed? No   If their condition worsens, is the pt aware to call PCP or go to the Emergency Dept.? Yes  Was the patient provided with contact information for the PCP's office or ED? Yes  Was to pt encouraged to call back with questions or concerns? Yes

## 2020-08-27 ENCOUNTER — Telehealth: Payer: Self-pay | Admitting: Primary Care

## 2020-08-27 LAB — SYNOVIAL FLUID ANALYSIS, COMPLETE
Basophils, %: 0 %
Eosinophils-Synovial: 0 % (ref 0–2)
Lymphocytes-Synovial Fld: 5 % (ref 0–74)
Monocyte/Macrophage: 0 % (ref 0–69)
Neutrophil, Synovial: 95 % — ABNORMAL HIGH (ref 0–24)
Synoviocytes, %: 0 % (ref 0–15)
WBC, Synovial: 9090 cells/uL — ABNORMAL HIGH (ref <150)

## 2020-08-27 LAB — TIQ-NTM

## 2020-08-27 NOTE — Telephone Encounter (Signed)
Copied from [place] [id number]. Topic: General - Other >> Aug 24, 2020 10:07 AM Yvette Rack wrote: Reason for CRM: Pt would like to renew orange card. Cb# [phone] >> Aug 27, 2020  8:55 AM Lennox Solders wrote: Pt is calling and would like carlos to call her back. Pt is trying to get an appt with carlos before 09-06-2020   We called patient and notified her nothing available sooner than 09/06/20. Patient requested call back from [facility] to be scheduled prior to the 14th.

## 2020-08-30 ENCOUNTER — Ambulatory Visit (INDEPENDENT_AMBULATORY_CARE_PROVIDER_SITE_OTHER): Payer: Self-pay | Admitting: Rehabilitative and Restorative Service Providers"

## 2020-08-30 ENCOUNTER — Ambulatory Visit (INDEPENDENT_AMBULATORY_CARE_PROVIDER_SITE_OTHER): Payer: Medicaid Other | Admitting: Primary Care

## 2020-08-30 ENCOUNTER — Other Ambulatory Visit: Payer: Self-pay

## 2020-08-30 ENCOUNTER — Encounter: Payer: Self-pay | Admitting: Rehabilitative and Restorative Service Providers"

## 2020-08-30 DIAGNOSIS — M25061 Hemarthrosis, right knee: Secondary | ICD-10-CM

## 2020-08-30 DIAGNOSIS — M25611 Stiffness of right shoulder, not elsewhere classified: Secondary | ICD-10-CM

## 2020-08-30 DIAGNOSIS — M25461 Effusion, right knee: Secondary | ICD-10-CM

## 2020-08-30 DIAGNOSIS — M6281 Muscle weakness (generalized): Secondary | ICD-10-CM

## 2020-08-30 DIAGNOSIS — R600 Localized edema: Secondary | ICD-10-CM

## 2020-08-30 DIAGNOSIS — M25511 Pain in right shoulder: Secondary | ICD-10-CM

## 2020-08-30 NOTE — Therapy (Signed)
[facility] [address] [place], [place], [address] Phone: [phone]   Fax:  [phone]  Physical Therapy Treatment  Patient Details  Name: Denise Morales MRN: [id number] Date of Birth: 24-Nov-1960 Referring Provider (PT): Jeronimo Norma, MD   Encounter Date: 08/30/2020   PT End of Session - 08/30/20 1507    Visit Number 2    Number of Visits 32    Date for PT Re-Evaluation 12/13/20    Progress Note Due on Visit 10    PT Start Time 1510    PT Stop Time 1550    PT Time Calculation (min) 40 min    Equipment Utilized During Treatment Other (comment)   Sling   Activity Tolerance Patient tolerated treatment well    Behavior During Therapy [facility] for tasks assessed/performed           Past Medical History:  Diagnosis Date  . Depression   . GERD (gastroesophageal reflux disease)   . Hypertension     Past Surgical History:  Procedure Laterality Date  . ABDOMINAL HYSTERECTOMY  1985  . CHOLECYSTECTOMY    . SHOULDER ARTHROSCOPY WITH ROTATOR CUFF REPAIR AND SUBACROMIAL DECOMPRESSION Right 08/12/2020   Procedure: RIGHT SHOULDER ARTHROSCOPY WITH ROTATOR CUFF REPAIR AND SUBACROMIAL DECOMPRESSION;  Surgeon: Mcarthur Rossetti, MD;  Location: [facility];  Service: Orthopedics;  Laterality: Right;    There were no vitals filed for this visit.   Subjective Assessment - 08/30/20 1511    Subjective Pt. stated no pain rest today.  Pt. stated pain today up to 4-5/10 or so.  Recently had Rt knee troubles seen at ED, wearing immobilizer today, to have MRI.    Patient is accompained by: Family member   Denise Morales, husband   Pertinent History HTN    Limitations House hold activities;Lifting    Diagnostic tests MRI 06/14/2020 R shoulder: Full-thickness full width rupture of the supraspinatus tendon  retracted back 1.6 cm. No muscular atrophy or edema.  2. Intrasubstance partial thickness tearing of the infraspinatus  tendon along the  myotendinous junction.  3. Mild subscapularis tendinopathy.  4. 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    Patient Stated Goals be able to use her arm, lift things, dress and groom herself etc.    Currently in Pain? No/denies    Pain Score 0-No pain    Pain Location Shoulder    Pain Orientation Right    Pain Onset 1 to 4 weeks ago              [facility] PT Assessment - 08/30/20 0001      PROM   Right Shoulder Flexion 120 Degrees                         [facility] Adult PT Treatment/Exercise - 08/30/20 0001      Exercises   Exercises Shoulder      Shoulder Exercises: Supine   Other Supine Exercises scap retraction 5 sec hold x 10    Other Supine Exercises passive Rt GH flexion c Lt UE help x 10      Shoulder Exercises: Seated   Other Seated Exercises seated pendulum c Lt UE help x 10 flexion, add/abd horizontal, circles ccw/cw      Vasopneumatic   Number Minutes Vasopneumatic  10 minutes    Vasopnuematic Location  Shoulder    Vasopneumatic Pressure Medium    Vasopneumatic Temperature  34      Manual  Therapy   Manual therapy comments g2-g3 inferior jt mobs, ap mobs Rt GH jt, PROM all directions                    PT Short Term Goals - 08/23/20 1709      PT SHORT TERM GOAL #1   Title Patient will be I with initial progression of HEP    Time 4    Period Weeks    Status New    Target Date 09/21/20      PT SHORT TERM GOAL #2   Title Patient will demonstrate improved Rt shoulder PROM    Baseline see assessment    Time 4    Period Weeks    Status New    Target Date 09/21/20             PT Long Term Goals - 08/23/20 1711      PT LONG TERM GOAL #1   Title Patient will show improved FOTO score to above 59%    Baseline 32 baseline    Time 16    Period Weeks    Target Date 12/13/20      PT LONG TERM GOAL #2   Title Patient will demonstrate improved Rt shoulder strength to be 4+/5 for better ability to function with  ADLs    Baseline unable to assess patient strength due to AROM limitations    Time 16    Period Weeks    Status New    Target Date 12/13/20      PT LONG TERM GOAL #3   Title Patient will demonstrate AROM that is [facility] for ADLs    Baseline no AROM due to precautions    Time 16    Period Days    Status New    Target Date 12/13/20      PT LONG TERM GOAL #4   Title Patient will demonstrate ability to perform daily tasks without restrictions of motion or pain increasing over 2/10    Baseline rest pain 3/10    Time 16    Period Weeks    Status New    Target Date 12/13/20      PT LONG TERM GOAL #5   Title Patient will be I in her long term HEP for continued strengthening and maintenence of movement    Time 16    Period Weeks    Status New    Target Date 12/13/20                 Plan - 08/30/20 1534    Clinical Impression Statement Pt. in midst of passive movement protocol post surgery at this time.  Good gains in symptom reduction and overall PROM gains, noted in flexion as documented.  No specific jt restriction noted in passive accessory motion assessment.  Adapted some intervention in supine today due to knee immobilizer comfort.    Examination-Activity Limitations Bathing;Dressing;Hygiene/Grooming;Carry    Examination-Participation Restrictions Driving;Community Activity;Cleaning;Shop;Meal Prep;Laundry    Stability/Clinical Decision Making Stable/Uncomplicated    Rehab Potential Good    PT Frequency 2x / week    PT Duration Other (comment)   16 weeks   PT Treatment/Interventions ADLs/Self Care Home Management;Aquatic Therapy;Taping;Vasopneumatic Device;Dry needling;Passive range of motion;Scar mobilization;Patient/family education;Manual techniques;Neuromuscular re-education;Therapeutic exercise;Therapeutic activities;Functional mobility training;Ultrasound;Moist Heat;Iontophoresis 4mg /ml Dexamethasone;Electrical Stimulation;Cryotherapy;Biofeedback    PT Next Visit Plan  Continue PROM at this time, anticipate transitioning to AAROM/AROM around 4-5 weeks post op pending symptoms.    PT Home Exercise  Plan Access Code: [email or web address]    Consulted and Agree with Plan of Care Patient           Patient will benefit from skilled therapeutic intervention in order to improve the following deficits and impairments:  Decreased range of motion,Impaired UE functional use,Pain,Impaired flexibility,Increased edema,Decreased strength,Decreased mobility  Visit Diagnosis: Acute pain of right shoulder  Stiffness of right shoulder, not elsewhere classified  Localized edema  Muscle weakness (generalized)     Problem List Patient Active Problem List   Diagnosis Date Noted  . Complete tear of right rotator cuff 08/12/2020  . Hypertensive disorder 05/05/2019    Scot Jun, PT, DPT, OCS, ATC 08/30/20  3:37 PM    [facility] [address] [place], [place], [address] Phone: [phone]   Fax:  [phone]  Name: Denise Morales MRN: [id number] Date of Birth: Sep 23, 1960

## 2020-08-31 NOTE — Telephone Encounter (Signed)
I return Pt call, she is aware she has a financial appt for Monday

## 2020-09-03 ENCOUNTER — Other Ambulatory Visit: Payer: Self-pay

## 2020-09-03 ENCOUNTER — Ambulatory Visit (INDEPENDENT_AMBULATORY_CARE_PROVIDER_SITE_OTHER): Payer: Self-pay | Admitting: Physical Therapy

## 2020-09-03 DIAGNOSIS — M6281 Muscle weakness (generalized): Secondary | ICD-10-CM

## 2020-09-03 DIAGNOSIS — R600 Localized edema: Secondary | ICD-10-CM

## 2020-09-03 DIAGNOSIS — M25511 Pain in right shoulder: Secondary | ICD-10-CM

## 2020-09-03 DIAGNOSIS — M25611 Stiffness of right shoulder, not elsewhere classified: Secondary | ICD-10-CM

## 2020-09-03 NOTE — Therapy (Signed)
[facility] [address] [place], [place], [address] Phone: [phone]   Fax:  [phone]  Physical Therapy Treatment  Patient Details  Name: Denise Morales MRN: [id number] Date of Birth: May 16, 1961 Referring Provider (PT): Jeronimo Norma, MD   Encounter Date: 09/03/2020   PT End of Session - 09/03/20 1506    Visit Number 3    Number of Visits 32    Date for PT Re-Evaluation 12/13/20    Progress Note Due on Visit 10    PT Start Time 1430    PT Stop Time 1512    PT Time Calculation (min) 42 min    Equipment Utilized During Treatment Other (comment)   Sling   Activity Tolerance Patient tolerated treatment well    Behavior During Therapy [facility] for tasks assessed/performed           Past Medical History:  Diagnosis Date  . Depression   . GERD (gastroesophageal reflux disease)   . Hypertension     Past Surgical History:  Procedure Laterality Date  . ABDOMINAL HYSTERECTOMY  1985  . CHOLECYSTECTOMY    . SHOULDER ARTHROSCOPY WITH ROTATOR CUFF REPAIR AND SUBACROMIAL DECOMPRESSION Right 08/12/2020   Procedure: RIGHT SHOULDER ARTHROSCOPY WITH ROTATOR CUFF REPAIR AND SUBACROMIAL DECOMPRESSION;  Surgeon: Mcarthur Rossetti, MD;  Location: [place] [facility];  Service: Orthopedics;  Laterality: Right;    There were no vitals filed for this visit.   Subjective Assessment - 09/03/20 1503    Subjective Pt. stated about 3/10 Rt shoulder pain, she continues to have alot of Rt knee pain is having an MRI for this.    Patient is accompained by: Family member   [name], husband   Pertinent History HTN    Limitations House hold activities;Lifting    Diagnostic tests MRI 06/14/2020 R shoulder: Full-thickness full width rupture of the supraspinatus tendon  retracted back 1.6 cm. No muscular atrophy or edema.  2. Intrasubstance partial thickness tearing of the infraspinatus  tendon along the myotendinous junction.  3. Mild subscapularis  tendinopathy.  4. 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    Patient Stated Goals be able to use her arm, lift things, dress and groom herself etc.    Pain Onset 1 to 4 weeks ago             [facility] Adult PT Treatment/Exercise - 09/03/20 0001      Shoulder Exercises: Seated   Other Seated Exercises seated pendulum c Lt UE help x 20 circles ccw/cw    Other Seated Exercises scap retractions 5 sec hold X 10, elbow AROM X 10 reps, seated tableslide PROM flexion, abd, and ER  X10 ea      Shoulder Exercises: Pulleys   Flexion 2 minutes    Flexion Limitations PROM instructions    Scaption 2 minutes    Scaption Limitations PROM instructions      Vasopneumatic   Number Minutes Vasopneumatic  10 minutes    Vasopnuematic Location  Shoulder    Vasopneumatic Pressure Medium    Vasopneumatic Temperature  34      Manual Therapy   Passive ROM Rt shoulder all planes to tolerance                    PT Short Term Goals - 08/23/20 1709      PT SHORT TERM GOAL #1   Title Patient will be I with initial progression of HEP    Time 4  Period Weeks    Status New    Target Date 09/21/20      PT SHORT TERM GOAL #2   Title Patient will demonstrate improved Rt shoulder PROM    Baseline see assessment    Time 4    Period Weeks    Status New    Target Date 09/21/20             PT Long Term Goals - 08/23/20 1711      PT LONG TERM GOAL #1   Title Patient will show improved FOTO score to above 59%    Baseline 32 baseline    Time 16    Period Weeks    Target Date 12/13/20      PT LONG TERM GOAL #2   Title Patient will demonstrate improved Rt shoulder strength to be 4+/5 for better ability to function with ADLs    Baseline unable to assess patient strength due to AROM limitations    Time 16    Period Weeks    Status New    Target Date 12/13/20      PT LONG TERM GOAL #3   Title Patient will demonstrate AROM that is [facility] for ADLs    Baseline no AROM  due to precautions    Time 16    Period Days    Status New    Target Date 12/13/20      PT LONG TERM GOAL #4   Title Patient will demonstrate ability to perform daily tasks without restrictions of motion or pain increasing over 2/10    Baseline rest pain 3/10    Time 16    Period Weeks    Status New    Target Date 12/13/20      PT LONG TERM GOAL #5   Title Patient will be I in her long term HEP for continued strengthening and maintenence of movement    Time 16    Period Weeks    Status New    Target Date 12/13/20                 Plan - 09/03/20 1507    Clinical Impression Statement She is doing well with PROM post op. She had good tolerance to PROM activities and manual stretching today. Pt will continue to progress per RTC protocol as she can tolerate it.    Examination-Activity Limitations Bathing;Dressing;Hygiene/Grooming;Carry    Examination-Participation Restrictions Driving;Community Activity;Cleaning;Shop;Meal Prep;Laundry    Stability/Clinical Decision Making Stable/Uncomplicated    Rehab Potential Good    PT Frequency 2x / week    PT Duration Other (comment)   16 weeks   PT Treatment/Interventions ADLs/Self Care Home Management;Aquatic Therapy;Taping;Vasopneumatic Device;Dry needling;Passive range of motion;Scar mobilization;Patient/family education;Manual techniques;Neuromuscular re-education;Therapeutic exercise;Therapeutic activities;Functional mobility training;Ultrasound;Moist Heat;Iontophoresis 4mg /ml Dexamethasone;Electrical Stimulation;Cryotherapy;Biofeedback    PT Next Visit Plan Continue PROM at this time, anticipate transitioning to AAROM/AROM around 4-5 weeks post op pending symptoms.    PT Home Exercise Plan Access Code: [email or web address]    Consulted and Agree with Plan of Care Patient           Patient will benefit from skilled therapeutic intervention in order to improve the following deficits and impairments:  Decreased range of motion,Impaired UE  functional use,Pain,Impaired flexibility,Increased edema,Decreased strength,Decreased mobility  Visit Diagnosis: Acute pain of right shoulder  Stiffness of right shoulder, not elsewhere classified  Localized edema  Muscle weakness (generalized)     Problem List Patient Active Problem List   Diagnosis Date Noted  .  Complete tear of right rotator cuff 08/12/2020  . Hypertensive disorder 05/05/2019    Silvestre Mesi 09/03/2020, 3:10 PM  [facility] [address] [place], [place], [address] Phone: [phone]   Fax:  [phone]  Name: Denise Morales MRN: [id number] Date of Birth: [birth year]

## 2020-09-06 ENCOUNTER — Other Ambulatory Visit: Payer: Self-pay | Admitting: Family Medicine

## 2020-09-06 ENCOUNTER — Ambulatory Visit: Payer: Self-pay | Attending: Family Medicine

## 2020-09-06 ENCOUNTER — Ambulatory Visit (INDEPENDENT_AMBULATORY_CARE_PROVIDER_SITE_OTHER): Payer: Self-pay | Admitting: Physical Therapy

## 2020-09-06 ENCOUNTER — Other Ambulatory Visit: Payer: Self-pay

## 2020-09-06 DIAGNOSIS — R600 Localized edema: Secondary | ICD-10-CM

## 2020-09-06 DIAGNOSIS — M25611 Stiffness of right shoulder, not elsewhere classified: Secondary | ICD-10-CM

## 2020-09-06 DIAGNOSIS — M25511 Pain in right shoulder: Secondary | ICD-10-CM

## 2020-09-06 DIAGNOSIS — M6281 Muscle weakness (generalized): Secondary | ICD-10-CM

## 2020-09-06 NOTE — Therapy (Signed)
[facility] Physical Therapy [address] [place], [place], [address] Phone: [phone]   Fax:  [phone]  Physical Therapy Treatment  Patient Details  Name: Denise Morales MRN: [id number] Date of Birth: 1961-01-17 Referring Provider (PT): Jeronimo Norma, MD   Encounter Date: 09/06/2020   PT End of Session - 09/06/20 1556    Visit Number 4    Number of Visits 32    Date for PT Re-Evaluation 12/13/20    Progress Note Due on Visit 10    PT Start Time 1521    PT Stop Time 1600    PT Time Calculation (min) 39 min    Equipment Utilized During Treatment Other (comment)   Sling   Activity Tolerance Patient tolerated treatment well    Behavior During Therapy [facility] for tasks assessed/performed           Past Medical History:  Diagnosis Date  . Depression   . GERD (gastroesophageal reflux disease)   . Hypertension     Past Surgical History:  Procedure Laterality Date  . ABDOMINAL HYSTERECTOMY  1985  . CHOLECYSTECTOMY    . SHOULDER ARTHROSCOPY WITH ROTATOR CUFF REPAIR AND SUBACROMIAL DECOMPRESSION Right 08/12/2020   Procedure: RIGHT SHOULDER ARTHROSCOPY WITH ROTATOR CUFF REPAIR AND SUBACROMIAL DECOMPRESSION;  Surgeon: Mcarthur Rossetti, MD;  Location: [facility];  Service: Orthopedics;  Laterality: Right;    There were no vitals filed for this visit.   Subjective Assessment - 09/06/20 1554    Subjective Pt. stated about 4/10 Rt shoulder pain.    Patient is accompained by: Family member   [name], husband   Pertinent History HTN    Limitations House hold activities;Lifting    Diagnostic tests MRI 06/14/2020 R shoulder: Full-thickness full width rupture of the supraspinatus tendon  retracted back 1.6 cm. No muscular atrophy or edema.  2. Intrasubstance partial thickness tearing of the infraspinatus  tendon along the myotendinous junction.  3. Mild subscapularis tendinopathy.  4. Mild degenerative AC joint and glenohumeral joint  arthropathy.    Patient Stated Goals be able to use her arm, lift things, dress and groom herself etc.    Pain Onset 1 to 4 weeks ago            [facility] Adult PT Treatment/Exercise - 09/06/20 0001      Shoulder Exercises: Seated   Other Seated Exercises seated pendulum c Lt UE help x 20 circles ccw/cw    Other Seated Exercises scap retractions 5 sec hold X 15, elbow AROM X 20 reps, seated tableslide PROM flexion, abd, and ER  X10 ea      Shoulder Exercises: Pulleys   Flexion 2 minutes    Flexion Limitations PROM instructions    Scaption 2 minutes    Scaption Limitations PROM instructions      Vasopneumatic   Number Minutes Vasopneumatic  10 minutes    Vasopnuematic Location  Shoulder    Vasopneumatic Pressure Medium    Vasopneumatic Temperature  34      Manual Therapy   Passive ROM Rt shoulder all planes to tolerance                    PT Short Term Goals - 08/23/20 1709      PT SHORT TERM GOAL #1   Title Patient will be I with initial progression of HEP    Time 4    Period Weeks    Status New    Target Date 09/21/20  PT SHORT TERM GOAL #2   Title Patient will demonstrate improved Rt shoulder PROM    Baseline see assessment    Time 4    Period Weeks    Status New    Target Date 09/21/20             PT Long Term Goals - 08/23/20 1711      PT LONG TERM GOAL #1   Title Patient will show improved FOTO score to above 59%    Baseline 32 baseline    Time 16    Period Weeks    Target Date 12/13/20      PT LONG TERM GOAL #2   Title Patient will demonstrate improved Rt shoulder strength to be 4+/5 for better ability to function with ADLs    Baseline unable to assess patient strength due to AROM limitations    Time 16    Period Weeks    Status New    Target Date 12/13/20      PT LONG TERM GOAL #3   Title Patient will demonstrate AROM that is [facility] for ADLs    Baseline no AROM due to precautions    Time 16    Period Days    Status New    Target  Date 12/13/20      PT LONG TERM GOAL #4   Title Patient will demonstrate ability to perform daily tasks without restrictions of motion or pain increasing over 2/10    Baseline rest pain 3/10    Time 16    Period Weeks    Status New    Target Date 12/13/20      PT LONG TERM GOAL #5   Title Patient will be I in her long term HEP for continued strengthening and maintenence of movement    Time 16    Period Weeks    Status New    Target Date 12/13/20                 Plan - 09/06/20 1557    Clinical Impression Statement Continued with PROM activities with good tolerance and her PROM is doing great. She is 4 weeks post op and has about 2 more weeks before we will begin AROM per RTC protocol. She can likely progress to light AAROM next week    Examination-Activity Limitations Bathing;Dressing;Hygiene/Grooming;Carry    Examination-Participation Restrictions Driving;Community Activity;Cleaning;Shop;Meal Prep;Laundry    Stability/Clinical Decision Making Stable/Uncomplicated    Rehab Potential Good    PT Frequency 2x / week    PT Duration Other (comment)   16 weeks   PT Treatment/Interventions ADLs/Self Care Home Management;Aquatic Therapy;Taping;Vasopneumatic Device;Dry needling;Passive range of motion;Scar mobilization;Patient/family education;Manual techniques;Neuromuscular re-education;Therapeutic exercise;Therapeutic activities;Functional mobility training;Ultrasound;Moist Heat;Iontophoresis 4mg /ml Dexamethasone;Electrical Stimulation;Cryotherapy;Biofeedback    PT Next Visit Plan Continue PROM at this time, anticipate transitioning to AAROM/ around 4 and AROM about 5-6 weeks post op pending symptoms.    PT Home Exercise Plan Access Code: [email or web address]    Consulted and Agree with Plan of Care Patient           Patient will benefit from skilled therapeutic intervention in order to improve the following deficits and impairments:  Decreased range of motion,Impaired UE functional  use,Pain,Impaired flexibility,Increased edema,Decreased strength,Decreased mobility  Visit Diagnosis: Acute pain of right shoulder  Stiffness of right shoulder, not elsewhere classified  Localized edema  Muscle weakness (generalized)     Problem List Patient Active Problem List   Diagnosis Date Noted  . Complete tear of  right rotator cuff 08/12/2020  . Hypertensive disorder 05/05/2019    Silvestre Mesi 09/06/2020, 4:00 PM  [facility] Physical Therapy [address] [place], [place], [address] Phone: [phone]   Fax:  [phone]  Name: Denise Morales MRN: [id number] Date of Birth: 1961/05/12

## 2020-09-09 ENCOUNTER — Other Ambulatory Visit: Payer: Self-pay

## 2020-09-09 ENCOUNTER — Ambulatory Visit (INDEPENDENT_AMBULATORY_CARE_PROVIDER_SITE_OTHER): Payer: Self-pay | Admitting: Physical Therapy

## 2020-09-09 DIAGNOSIS — M25511 Pain in right shoulder: Secondary | ICD-10-CM

## 2020-09-09 DIAGNOSIS — M6281 Muscle weakness (generalized): Secondary | ICD-10-CM

## 2020-09-09 DIAGNOSIS — M25611 Stiffness of right shoulder, not elsewhere classified: Secondary | ICD-10-CM

## 2020-09-09 NOTE — Patient Instructions (Signed)
Access Code: [email or web address] URL: [email or web address] Date: 09/09/2020 Prepared by: Elsie Ra  Exercises Circular Shoulder Pendulum with Table Support - 2 x daily - 6 x weekly - 1-2 sets - 10 reps Seated Scapular Retraction - 2 x daily - 6 x weekly - 1-2 sets - 10 reps Towel Roll Squeeze - 2 x daily - 6 x weekly - 1-2 sets - 10 reps - 5 hold Supine Shoulder Flexion with Dowel - 1 x daily - 3 x weekly - 10 reps - 1-2 sets Supine Shoulder Abduction AAROM with Dowel - 2 x daily - 6 x weekly - 1-2 sets - 10 reps Supine Shoulder External Rotation in 45 Degrees Abduction AAROM with Dowel - 2 x daily - 6 x weekly - 10 reps - 1-2 sets

## 2020-09-09 NOTE — Therapy (Signed)
[facility] Physical Therapy [address] [place], [place], [address] Phone: [phone]   Fax:  [phone]  Physical Therapy Treatment  Patient Details  Name: Denise Morales MRN: [id number] Date of Birth: 1961/03/18 Referring Provider (PT): Jeronimo Norma, MD   Encounter Date: 09/09/2020   PT End of Session - 09/09/20 1630    Visit Number 5    Number of Visits 32    Date for PT Re-Evaluation 12/13/20    Progress Note Due on Visit 10    PT Start Time 1559    PT Stop Time 1640    PT Time Calculation (min) 41 min    Equipment Utilized During Treatment Other (comment)   Sling   Activity Tolerance Patient tolerated treatment well    Behavior During Therapy [facility] for tasks assessed/performed           Past Medical History:  Diagnosis Date  . Depression   . GERD (gastroesophageal reflux disease)   . Hypertension     Past Surgical History:  Procedure Laterality Date  . ABDOMINAL HYSTERECTOMY  1985  . CHOLECYSTECTOMY    . SHOULDER ARTHROSCOPY WITH ROTATOR CUFF REPAIR AND SUBACROMIAL DECOMPRESSION Right 08/12/2020   Procedure: RIGHT SHOULDER ARTHROSCOPY WITH ROTATOR CUFF REPAIR AND SUBACROMIAL DECOMPRESSION;  Surgeon: Mcarthur Rossetti, MD;  Location: [facility];  Service: Orthopedics;  Laterality: Right;    There were no vitals filed for this visit.   Subjective Assessment - 09/09/20 1607    Subjective Pt. stated her Rt shoulder feels a little better overall and she relays compliance with HEP.    Patient is accompained by: Family member   [name], husband   Pertinent History HTN    Limitations House hold activities;Lifting    Diagnostic tests MRI 06/14/2020 R shoulder: Full-thickness full width rupture of the supraspinatus tendon  retracted back 1.6 cm. No muscular atrophy or edema.  2. Intrasubstance partial thickness tearing of the infraspinatus  tendon along the myotendinous junction.  3. Mild subscapularis tendinopathy.  4.  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    Patient Stated Goals be able to use her arm, lift things, dress and groom herself etc.    Pain Onset 1 to 4 weeks ago              [facility] PT Assessment - 09/09/20 0001      Assessment   Medical Diagnosis Rt. Rotator cuff repair    Referring Provider (PT) Jeronimo Norma, MD    Onset Date/Surgical Date 08/12/20      PROM   Right Shoulder Flexion 145 Degrees    Right Shoulder ABduction 130 Degrees    Right Shoulder External Rotation 60 Degrees    Right Shoulder Horizontal ABduction 65 Degrees                         [facility] Adult PT Treatment/Exercise - 09/09/20 0001      Shoulder Exercises: Supine   External Rotation AAROM;Right;15 reps    Flexion AAROM;Right;15 reps    ABduction Right;15 reps;AAROM      Shoulder Exercises: Seated   Other Seated Exercises tableslides AAROM X 10 reps for flexion, abd, ER    Other Seated Exercises scap retractions 5 sec hold X 20      Shoulder Exercises: Standing   Other Standing Exercises flexion and abd X 10 reps with Rt arm supported on pball rolling out on top of mat table  Shoulder Exercises: Pulleys   Flexion 2 minutes    Flexion Limitations PROM instructions    Scaption 2 minutes    Scaption Limitations PROM instructions      Vasopneumatic   Number Minutes Vasopneumatic  10 minutes    Vasopnuematic Location  Shoulder    Vasopneumatic Pressure Medium    Vasopneumatic Temperature  34      Manual Therapy   Passive ROM Rt shoulder all planes to tolerance                    PT Short Term Goals - 09/09/20 1632      PT SHORT TERM GOAL #1   Title Patient will be I with initial progression of HEP    Time 4    Period Weeks    Status Achieved    Target Date 09/21/20      PT SHORT TERM GOAL #2   Title Patient will demonstrate improved Rt shoulder PROM    Baseline see assessment    Time 4    Period Weeks    Status Achieved     Target Date 09/21/20             PT Long Term Goals - 08/23/20 1711      PT LONG TERM GOAL #1   Title Patient will show improved FOTO score to above 59%    Baseline 32 baseline    Time 16    Period Weeks    Target Date 12/13/20      PT LONG TERM GOAL #2   Title Patient will demonstrate improved Rt shoulder strength to be 4+/5 for better ability to function with ADLs    Baseline unable to assess patient strength due to AROM limitations    Time 16    Period Weeks    Status New    Target Date 12/13/20      PT LONG TERM GOAL #3   Title Patient will demonstrate AROM that is [facility] for ADLs    Baseline no AROM due to precautions    Time 16    Period Days    Status New    Target Date 12/13/20      PT LONG TERM GOAL #4   Title Patient will demonstrate ability to perform daily tasks without restrictions of motion or pain increasing over 2/10    Baseline rest pain 3/10    Time 16    Period Weeks    Status New    Target Date 12/13/20      PT LONG TERM GOAL #5   Title Patient will be I in her long term HEP for continued strengthening and maintenence of movement    Time 16    Period Weeks    Status New    Target Date 12/13/20                 Plan - 09/09/20 1631    Clinical Impression Statement Progressed her to gentle AAROM now that she is 4 weeks post op and she had good overall tolerance to this. Updated her HEP to begin this at home as well. She is making good progress with PT thus far and greatly improved her PROM. She has now met her short term goals.  Continue POC    Examination-Activity Limitations Bathing;Dressing;Hygiene/Grooming;Carry    Examination-Participation Restrictions Driving;Community Activity;Cleaning;Shop;Meal Prep;Laundry    Stability/Clinical Decision Making Stable/Uncomplicated    Rehab Potential Good    PT Frequency 2x / week  PT Duration Other (comment)   16 weeks   PT Treatment/Interventions ADLs/Self Care Home Management;Aquatic  Therapy;Taping;Vasopneumatic Device;Dry needling;Passive range of motion;Scar mobilization;Patient/family education;Manual techniques;Neuromuscular re-education;Therapeutic exercise;Therapeutic activities;Functional mobility training;Ultrasound;Moist Heat;Iontophoresis 63m/ml Dexamethasone;Electrical Stimulation;Cryotherapy;Biofeedback    PT Next Visit Plan Continue PROM at this time, anticipate transitioning to AAROM/ around 4 and AROM about 5-6 weeks post op pending symptoms.    PT Home Exercise Plan Access Code: [email or web address]   Consulted and Agree with Plan of Care Patient           Patient will benefit from skilled therapeutic intervention in order to improve the following deficits and impairments:  Decreased range of motion,Impaired UE functional use,Pain,Impaired flexibility,Increased edema,Decreased strength,Decreased mobility  Visit Diagnosis: Acute pain of right shoulder  Stiffness of right shoulder, not elsewhere classified  Muscle weakness (generalized)     Problem List Patient Active Problem List   Diagnosis Date Noted  . Complete tear of right rotator cuff 08/12/2020  . Hypertensive disorder 05/05/2019    BSilvestre Mesi3/17/2022, 4:36 PM  [facility] [address] [address]Phone: [phone]  Fax:  [phone] Name: SMykel MohlMRN: [id number] of Birth: [birth year]

## 2020-09-13 ENCOUNTER — Other Ambulatory Visit: Payer: Self-pay

## 2020-09-13 ENCOUNTER — Ambulatory Visit
Admission: RE | Admit: 2020-09-13 | Discharge: 2020-09-13 | Disposition: A | Discharge status: Home / Self Care | Payer: No Typology Code available for payment source | Source: Ambulatory Visit | Attending: Orthopaedic Surgery | Admitting: Orthopaedic Surgery

## 2020-09-13 ENCOUNTER — Encounter: Payer: Self-pay | Admitting: Physical Therapy

## 2020-09-13 ENCOUNTER — Ambulatory Visit (INDEPENDENT_AMBULATORY_CARE_PROVIDER_SITE_OTHER): Payer: Self-pay | Admitting: Physical Therapy

## 2020-09-13 DIAGNOSIS — M25061 Hemarthrosis, right knee: Secondary | ICD-10-CM

## 2020-09-13 DIAGNOSIS — M6281 Muscle weakness (generalized): Secondary | ICD-10-CM

## 2020-09-13 DIAGNOSIS — R600 Localized edema: Secondary | ICD-10-CM

## 2020-09-13 DIAGNOSIS — M25611 Stiffness of right shoulder, not elsewhere classified: Secondary | ICD-10-CM

## 2020-09-13 DIAGNOSIS — M25461 Effusion, right knee: Secondary | ICD-10-CM

## 2020-09-13 DIAGNOSIS — M25511 Pain in right shoulder: Secondary | ICD-10-CM

## 2020-09-13 IMAGING — MR MR KNEE*R* W/O CM
4 of 7 series · 19 of 40 positions shown · non-contrast
Comparison: X-ray [DATE]

CLINICAL DATA: Right knee pain since [DATE]

EXAM:
MRI OF THE RIGHT KNEE WITHOUT CONTRAST
TECHNIQUE: Multiplanar, multisequence MR imaging of the knee was performed. No
intravenous contrast was administered.

[Series 3: T2 fat-sat · axial · 4.0mm · 0.29mm/px · z∈[-73,+33]mm · 3 of 25 slices shown]
[im 1/25]
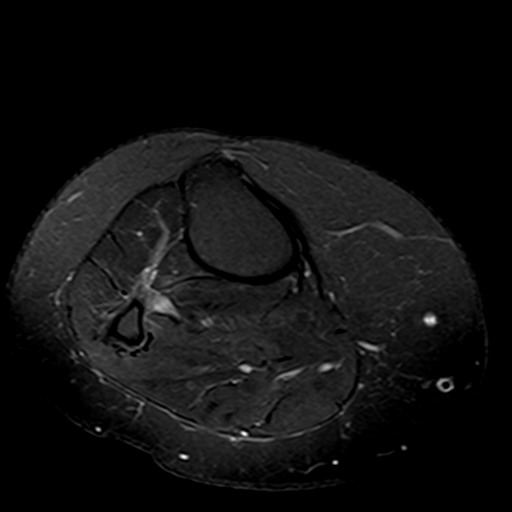
[im 13/25]
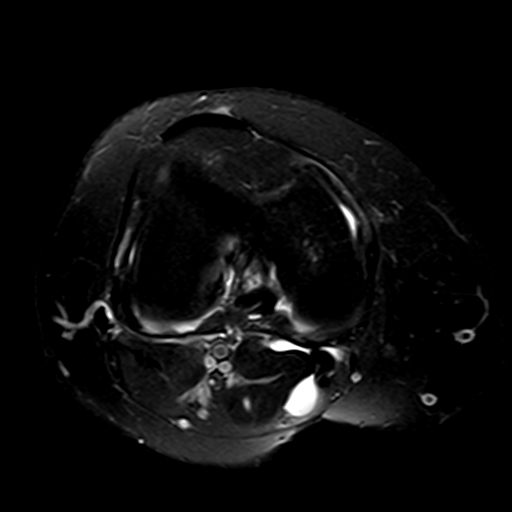
[im 25/25]
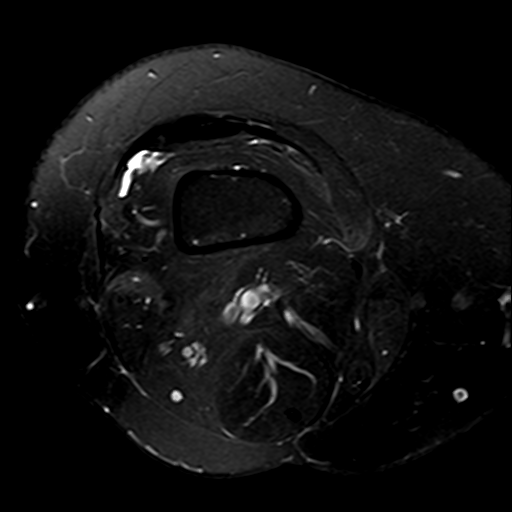

[Series 6: PD fat-sat · coronal · 3.0mm · 0.29mm/px · 7 of 28 slices shown (1 of 3)]
[im 1/28]
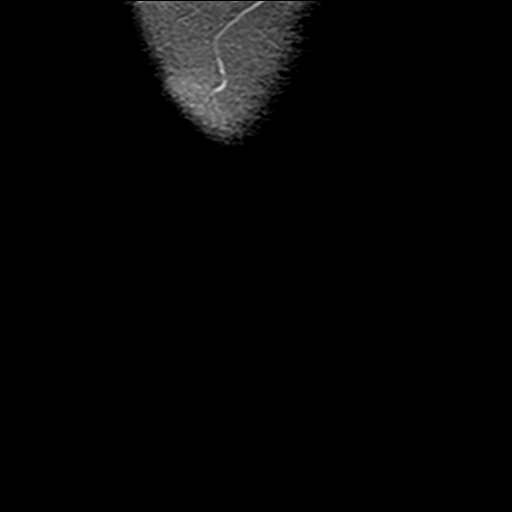
[im 5/28]
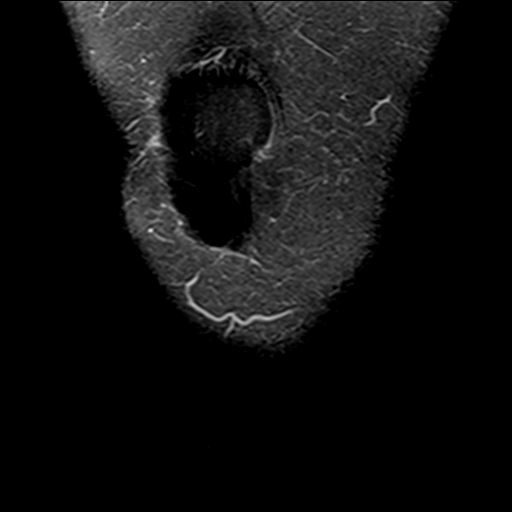
[im 10/28]
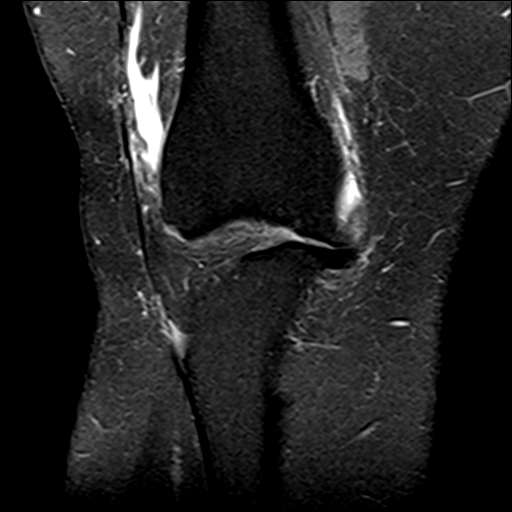
[im 14/28]
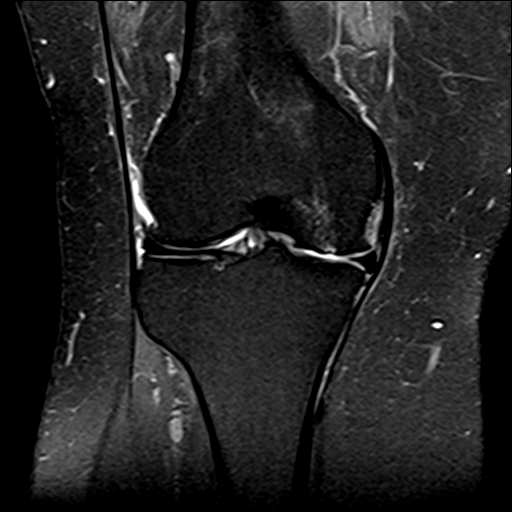
[im 19/28]
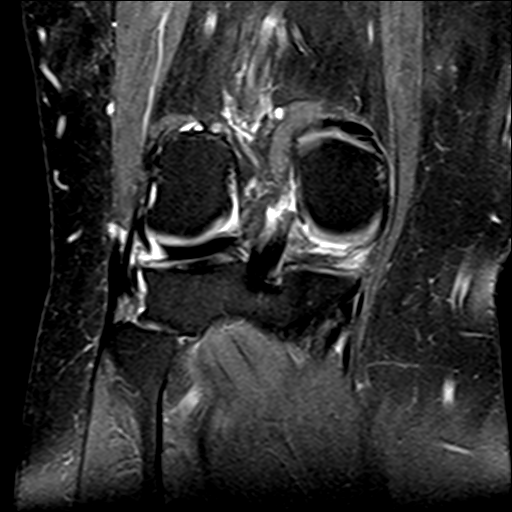
[im 23/28]
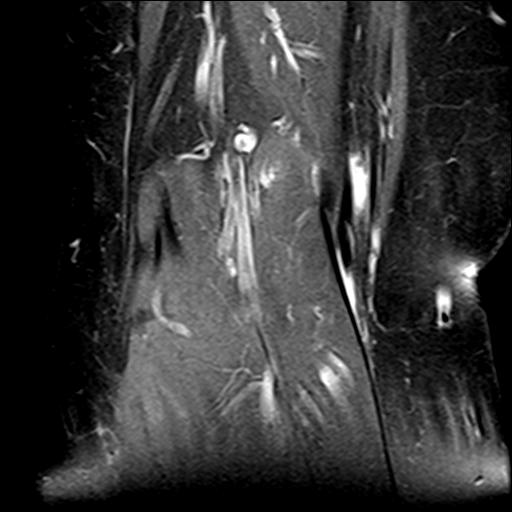
[im 28/28]
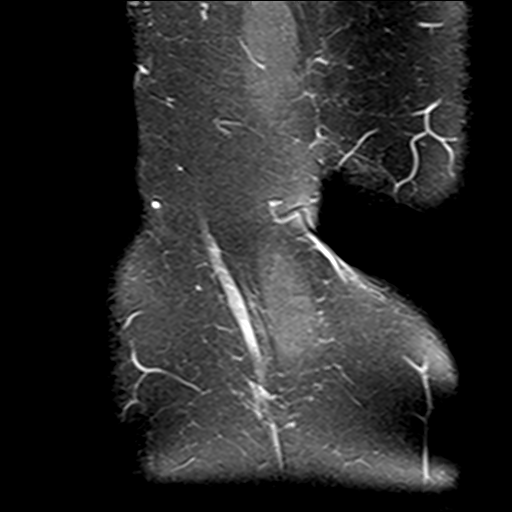

[Series 7: PD fat-sat · sagittal · 3.0mm · 0.29mm/px · 6 of 30 slices shown (2 of 3)]
[im 1/30]
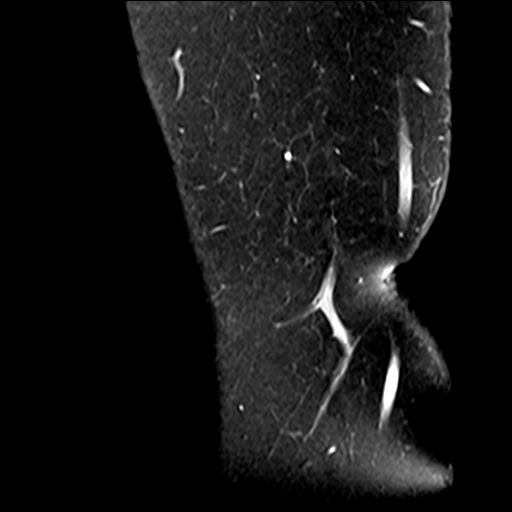
[im 5/30]
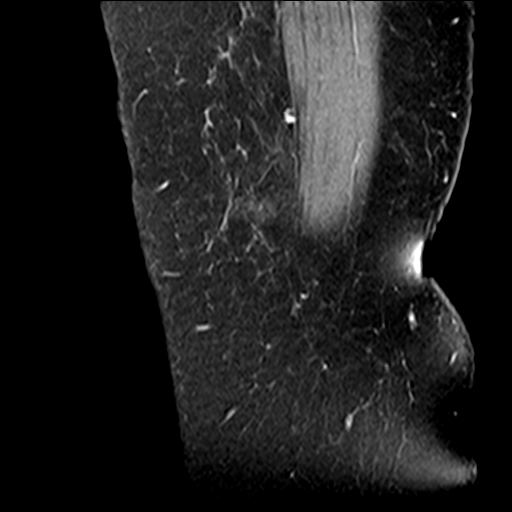
[im 10/30]
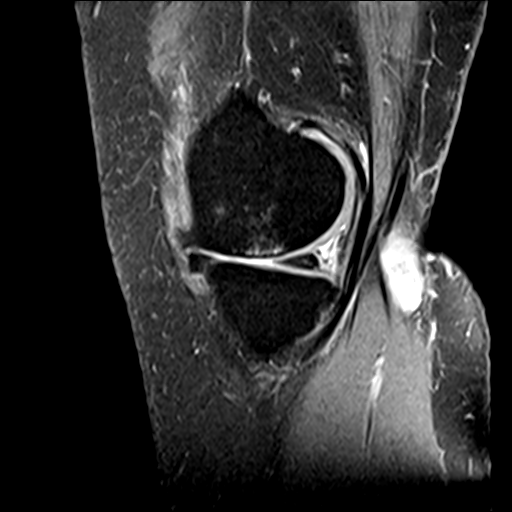
[im 15/30]
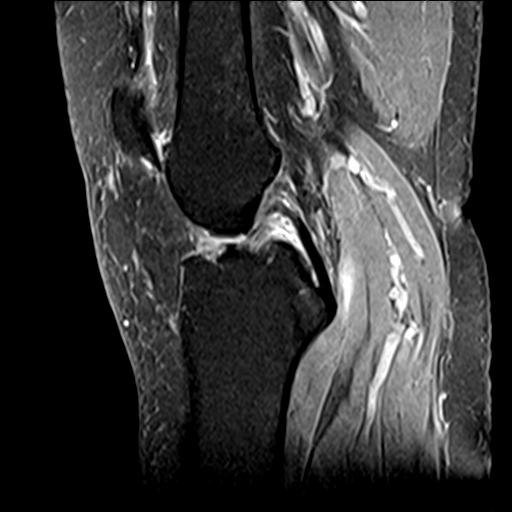
[im 20/30]
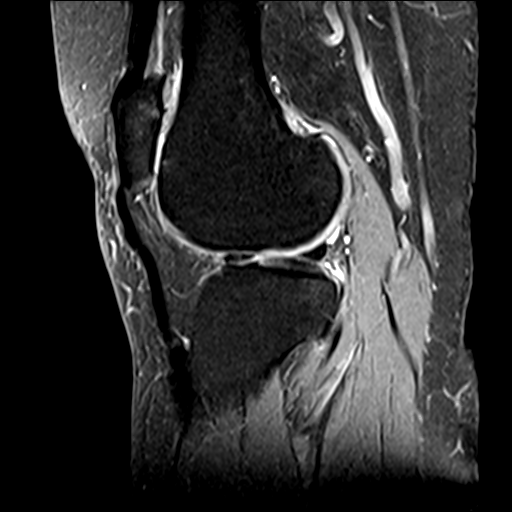
[im 25/30]
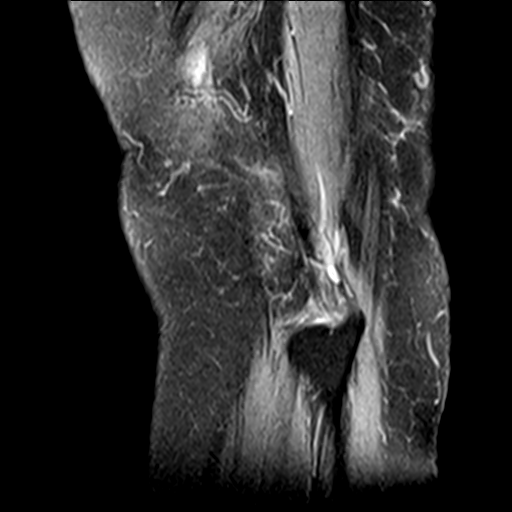

[Series 9: PD fat-sat · oblique · 2.0mm · 0.29mm/px · 3 of 11 slices shown (3 of 3)]
[im 1/11]
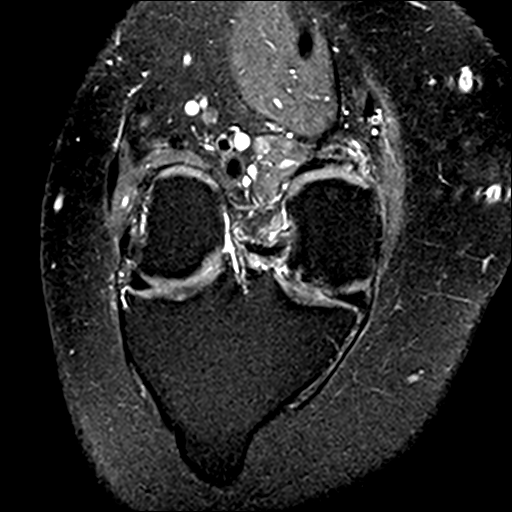
[im 6/11]
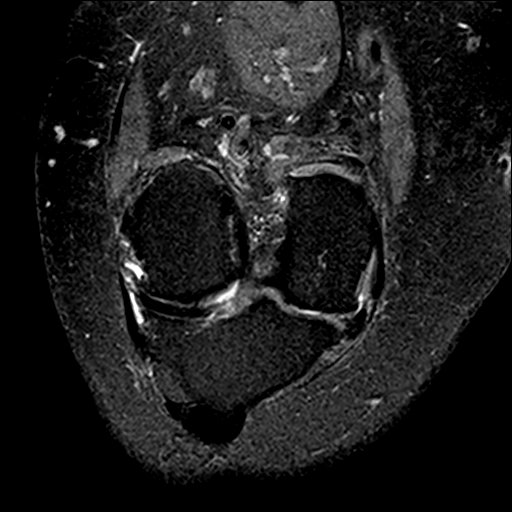
[im 11/11]
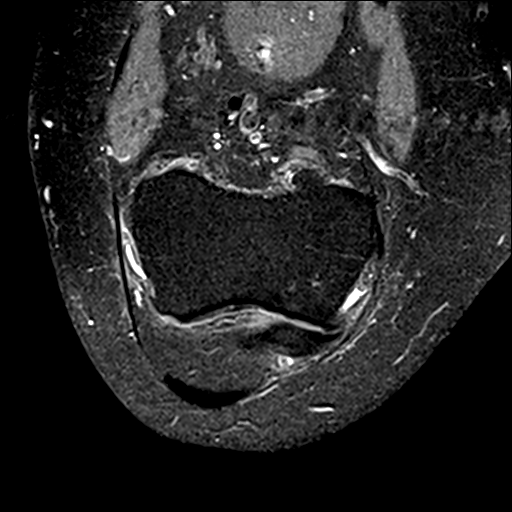

[19 of 40 positions shown; findings below may reference images not displayed]

FINDINGS: MENISCI

Medial meniscus: Intrasubstance degeneration with degenerative
fraying of the free edge and undersurface of the posterior horn
(series 7, images 19-20).

Lateral meniscus:  Mild intrasubstance degeneration.

LIGAMENTS

Cruciates:  Intact ACL and PCL.

Collaterals: Medial collateral ligament is intact. Lateral
collateral ligament complex is intact.

CARTILAGE

Patellofemoral: Large areas of full-thickness cartilage loss
involving the central to inferior aspects of the patella and
superior aspects of the trochlea.

Medial: Large area of full-thickness cartilage loss involving the
central weight-bearing medial femoral condyle. Mild chondral
thinning of the medial tibial plateau.

Lateral:  Mild chondral thinning without focal defect.

Joint:  Small knee joint effusion.  Fat pads within normal limits.

Popliteal Fossa: Small Baker's cyst containing a linear low signal
loose body measuring 6 x 6 mm. Intact popliteus tendon.

Extensor Mechanism:  Intact quadriceps tendon and patellar tendon.

Bones: Tricompartmental joint space narrowing and marginal
osteophyte formation. Degenerative subchondral marrow signal changes
within the medial femoral condyle. No fracture.

Other: None.
IMPRESSION: 1. Tricompartmental osteoarthritis, worst in the patellofemoral and
medial tibiofemoral compartments where there is full-thickness
cartilage loss.
2. Intrasubstance degeneration with degenerative fraying of the free
edge and undersurface of the posterior horn of the medial meniscus.
3. Small knee joint effusion. Small Baker's cyst containing a 6 mm
loose body.

## 2020-09-13 NOTE — Therapy (Signed)
[facility] Physical Therapy [address] [place], [place], [address] Phone: [phone]   Fax:  [phone]  Physical Therapy Treatment  Patient Details  Name: Denise Morales MRN: [id number] Date of Birth: July 05, 1960 Referring Provider (PT): Jeronimo Norma, MD   Encounter Date: 09/13/2020   PT End of Session - 09/13/20 1513    Visit Number 6    Number of Visits 32    Date for PT Re-Evaluation 12/13/20    Progress Note Due on Visit 10    PT Start Time 7209    PT Stop Time 1518    PT Time Calculation (min) 58 min    Equipment Utilized During Treatment Other (comment)   Sling   Activity Tolerance Patient tolerated treatment well    Behavior During Therapy [facility] for tasks assessed/performed           Past Medical History:  Diagnosis Date  . Depression   . GERD (gastroesophageal reflux disease)   . Hypertension     Past Surgical History:  Procedure Laterality Date  . ABDOMINAL HYSTERECTOMY  1985  . CHOLECYSTECTOMY    . SHOULDER ARTHROSCOPY WITH ROTATOR CUFF REPAIR AND SUBACROMIAL DECOMPRESSION Right 08/12/2020   Procedure: RIGHT SHOULDER ARTHROSCOPY WITH ROTATOR CUFF REPAIR AND SUBACROMIAL DECOMPRESSION;  Surgeon: Mcarthur Rossetti, MD;  Location: [place];  Service: Orthopedics;  Laterality: Right;    There were no vitals filed for this visit.   Subjective Assessment - 09/13/20 1423    Subjective She has been doing her exercises.  Her shoulder is more sore today.    Patient is accompained by: Family member   [name], husband   Pertinent History HTN    Limitations House hold activities;Lifting    Diagnostic tests MRI 06/14/2020 R shoulder: Full-thickness full width rupture of the supraspinatus tendon  retracted back 1.6 cm. No muscular atrophy or edema.  2. Intrasubstance partial thickness tearing of the infraspinatus  tendon along the myotendinous junction.  3. Mild subscapularis tendinopathy.  4. Mild degenerative AC joint  and glenohumeral joint arthropathy.    Patient Stated Goals be able to use her arm, lift things, dress and groom herself etc.    Currently in Pain? Yes    Pain Score 2     Pain Location Shoulder    Pain Orientation Right;Lateral    Pain Descriptors / Indicators Burning    Pain Type Acute pain    Pain Onset More than a month ago    Pain Frequency Intermittent    Aggravating Factors  move it certain    Pain Relieving Factors tylenol, ice                             [facility] Adult PT Treatment/Exercise - 09/13/20 1420      Self-Care   Self-Care Heat/Ice Application    Heat/Ice Application ice massage with demo & verbal cues. pt & husband verbalized understanding.      Shoulder Exercises: Supine   External Rotation AAROM;Right;15 reps    External Rotation Limitations 1st set humerus no abduction, 2nd set humerus 45* abduction, 3rd set humerus 90* abduction    ABduction Right;15 reps;AAROM      Shoulder Exercises: Seated   Retraction AROM;Both;20 reps   5 sec hold   Retraction Weight (lbs) hand positioned on mat table beside hip for full elbow extension    Other Seated Exercises tableslides AAROM X 10 reps for flexion, abd, ER  Shoulder Exercises: Sidelying   Flexion AAROM;Right;15 reps    Flexion Limitations PT switched to sidelying for gravity eliminated.      Shoulder Exercises: Standing   Other Standing Exercises flexion and abd X 10 reps with Rt arm supported on pball rolling out on top of mat table      Shoulder Exercises: Pulleys   Flexion 2 minutes    Flexion Limitations PROM instructions    Scaption 2 minutes    Scaption Limitations PROM instructions      Vasopneumatic   Number Minutes Vasopneumatic  10 minutes    Vasopnuematic Location  Shoulder    Vasopneumatic Pressure Medium    Vasopneumatic Temperature  34      Manual Therapy   Passive ROM Rt shoulder all planes to tolerance                    PT Short Term Goals - 09/09/20  1632      PT SHORT TERM GOAL #1   Title Patient will be I with initial progression of HEP    Time 4    Period Weeks    Status Achieved    Target Date 09/21/20      PT SHORT TERM GOAL #2   Title Patient will demonstrate improved Rt shoulder PROM    Baseline see assessment    Time 4    Period Weeks    Status Achieved    Target Date 09/21/20             PT Long Term Goals - 08/23/20 1711      PT LONG TERM GOAL #1   Title Patient will show improved FOTO score to above 59%    Baseline 32 baseline    Time 16    Period Weeks    Target Date 12/13/20      PT LONG TERM GOAL #2   Title Patient will demonstrate improved Rt shoulder strength to be 4+/5 for better ability to function with ADLs    Baseline unable to assess patient strength due to AROM limitations    Time 16    Period Weeks    Status New    Target Date 12/13/20      PT LONG TERM GOAL #3   Title Patient will demonstrate AROM that is [facility] for ADLs    Baseline no AROM due to precautions    Time 16    Period Days    Status New    Target Date 12/13/20      PT LONG TERM GOAL #4   Title Patient will demonstrate ability to perform daily tasks without restrictions of motion or pain increasing over 2/10    Baseline rest pain 3/10    Time 16    Period Weeks    Status New    Target Date 12/13/20      PT LONG TERM GOAL #5   Title Patient will be I in her long term HEP for continued strengthening and maintenence of movement    Time 16    Period Weeks    Status New    Target Date 12/13/20                 Plan - 09/13/20 1513    Clinical Impression Statement Patient responded to small range with progressively increasing ranges of all motions.  She was able to perform AAROM with less guarding as reps increased.  Her pain increased only slightly and only when  performing.    Examination-Activity Limitations Bathing;Dressing;Hygiene/Grooming;Carry    Examination-Participation Restrictions Driving;Community  Activity;Cleaning;Shop;Meal Prep;Laundry    Stability/Clinical Decision Making Stable/Uncomplicated    Rehab Potential Good    PT Frequency 2x / week    PT Duration Other (comment)   16 weeks   PT Treatment/Interventions ADLs/Self Care Home Management;Aquatic Therapy;Taping;Vasopneumatic Device;Dry needling;Passive range of motion;Scar mobilization;Patient/family education;Manual techniques;Neuromuscular re-education;Therapeutic exercise;Therapeutic activities;Functional mobility training;Ultrasound;Moist Heat;Iontophoresis 4mg /ml Dexamethasone;Electrical Stimulation;Cryotherapy;Biofeedback    PT Next Visit Plan AAROM until MD appt on 3/24. Progress as indicated by MD after appt.    PT Home Exercise Plan Access Code: [email or web address]    Consulted and Agree with Plan of Care Patient           Patient will benefit from skilled therapeutic intervention in order to improve the following deficits and impairments:  Decreased range of motion,Impaired UE functional use,Pain,Impaired flexibility,Increased edema,Decreased strength,Decreased mobility  Visit Diagnosis: Acute pain of right shoulder  Stiffness of right shoulder, not elsewhere classified  Muscle weakness (generalized)  Localized edema     Problem List Patient Active Problem List   Diagnosis Date Noted  . Complete tear of right rotator cuff 08/12/2020  . Hypertensive disorder 05/05/2019    Jamey Reas, PT, DPT 09/13/2020, 4:09 PM  [facility] Physical Therapy [address] [place], [place], [address] Phone: [phone]   Fax:  [phone]  Name: Denise Morales MRN: [id number] Date of Birth: 1961/04/15

## 2020-09-16 ENCOUNTER — Other Ambulatory Visit: Payer: Self-pay

## 2020-09-16 ENCOUNTER — Ambulatory Visit (INDEPENDENT_AMBULATORY_CARE_PROVIDER_SITE_OTHER): Payer: Self-pay | Admitting: Orthopaedic Surgery

## 2020-09-16 ENCOUNTER — Encounter: Payer: Self-pay | Admitting: Orthopaedic Surgery

## 2020-09-16 ENCOUNTER — Ambulatory Visit (INDEPENDENT_AMBULATORY_CARE_PROVIDER_SITE_OTHER): Payer: Self-pay | Admitting: Rehabilitative and Restorative Service Providers"

## 2020-09-16 ENCOUNTER — Encounter: Payer: Self-pay | Admitting: Rehabilitative and Restorative Service Providers"

## 2020-09-16 DIAGNOSIS — R600 Localized edema: Secondary | ICD-10-CM

## 2020-09-16 DIAGNOSIS — M25611 Stiffness of right shoulder, not elsewhere classified: Secondary | ICD-10-CM

## 2020-09-16 DIAGNOSIS — M6281 Muscle weakness (generalized): Secondary | ICD-10-CM

## 2020-09-16 DIAGNOSIS — M25511 Pain in right shoulder: Secondary | ICD-10-CM

## 2020-09-16 DIAGNOSIS — Z9889 Other specified postprocedural states: Secondary | ICD-10-CM

## 2020-09-16 DIAGNOSIS — M1711 Unilateral primary osteoarthritis, right knee: Secondary | ICD-10-CM

## 2020-09-16 DIAGNOSIS — M25461 Effusion, right knee: Secondary | ICD-10-CM

## 2020-09-16 DIAGNOSIS — S46011D Strain of muscle(s) and tendon(s) of the rotator cuff of right shoulder, subsequent encounter: Secondary | ICD-10-CM

## 2020-09-16 DIAGNOSIS — R293 Abnormal posture: Secondary | ICD-10-CM

## 2020-09-16 DIAGNOSIS — G8929 Other chronic pain: Secondary | ICD-10-CM

## 2020-09-16 NOTE — Progress Notes (Signed)
The patient comes in today for follow-up 4 weeks after a right shoulder arthroscopic rotator cuff repair.  We also had to obtain an MRI of her right knee due to severe acute pain of the right knee.  She is here for review that today as well.  She said therapy is going well with her shoulder.  On examination of her shoulder out of her sling she is abducting her shoulder better with still some weakness to be expected just 5 weeks status post rotator cuff repair.  Overall though she does look ahead of what a lot of people are at this time.  Examination right knee does show global tenderness to the knee especially the patellofemoral joint the medial joint line.  The knee is ligamentously stable.  An MRI of the knee does show full-thickness cartilage loss at the patellofemoral joint and the medial compartment of the knee.  There is no meniscal tear.  At some point she will likely need knee replacement.  We need to get her further through her rotator cuff repair recovery before subjecting her to knee replacement surgery because she will need to put weight through her shoulders with a walker.  We will look at her again in 4 weeks from now to evaluate her shoulder and hopefully that time work on scheduling her for a right total knee arthroplasty.  All question concerns were answered and addressed.

## 2020-09-16 NOTE — Therapy (Signed)
[facility] Physical Therapy [address] [place], [place], [address] Phone: [phone]   Fax:  [phone]  Physical Therapy Treatment  Patient Details  Name: Denise Morales MRN: [id number] Date of Birth: July 28, 1960 Referring Provider (PT): Denise Norma, MD   Encounter Date: 09/16/2020   PT End of Session - 09/16/20 1659    Visit Number 7    Number of Visits 32    Date for PT Re-Evaluation 12/13/20    Progress Note Due on Visit 10    PT Start Time 1300    PT Stop Time 1340    PT Time Calculation (min) 40 min    Equipment Utilized During Treatment --   Sling   Activity Tolerance Patient tolerated treatment well;No increased pain    Behavior During Therapy WFL for tasks assessed/performed           Past Medical History:  Diagnosis Date  . Depression   . GERD (gastroesophageal reflux disease)   . Hypertension     Past Surgical History:  Procedure Laterality Date  . ABDOMINAL HYSTERECTOMY  1985  . CHOLECYSTECTOMY    . SHOULDER ARTHROSCOPY WITH ROTATOR CUFF REPAIR AND SUBACROMIAL DECOMPRESSION Right 08/12/2020   Procedure: RIGHT SHOULDER ARTHROSCOPY WITH ROTATOR CUFF REPAIR AND SUBACROMIAL DECOMPRESSION;  Surgeon: Denise Rossetti, MD;  Location: [place];  Service: Orthopedics;  Laterality: Right;    There were no vitals filed for this visit.   Subjective Assessment - 09/16/20 1657    Subjective Denise Morales reports good compliance with her sling and HEP.  She is consistently getting about 3 hours of uninterrupted sleep at this time.    Patient is accompained by: Family member   Denise Morales, husband   Pertinent History HTN    Limitations House hold activities;Lifting    Diagnostic tests MRI 06/14/2020 R shoulder: Full-thickness full width rupture of the supraspinatus tendon  retracted back 1.6 cm. No muscular atrophy or edema.  2. Intrasubstance partial thickness tearing of the infraspinatus  tendon along the myotendinous  junction.  3. Mild subscapularis tendinopathy.  4. 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    Patient Stated Goals be able to use her arm, lift things, dress and groom herself etc.    Currently in Pain? Yes    Pain Score 4     Pain Location Shoulder    Pain Orientation Right    Pain Descriptors / Indicators Aching;Sore    Pain Type Chronic pain;Surgical pain    Pain Radiating Towards NA    Pain Onset More than a month ago    Pain Frequency Intermittent    Aggravating Factors  Sleep and R shoulder use    Pain Relieving Factors Ice and pain medications    Effect of Pain on Daily Activities Unable to use R UE with many ADLs    Multiple Pain Sites No              [facility] PT Assessment - 09/16/20 0001      PROM   Right Shoulder Flexion 125 Degrees    Right Shoulder Internal Rotation 60 Degrees    Right Shoulder External Rotation 60 Degrees                         [facility] Adult PT Treatment/Exercise - 09/16/20 0001      Exercises   Exercises Shoulder      Shoulder Exercises: Supine   Protraction Strengthening;Right;10 reps;Other (comment)   3  seconds   External Rotation AROM;Right;10 reps;Other (comment)   5 seconds   Internal Rotation AROM;Right;10 reps;Other (comment)   5 seconds     Shoulder Exercises: Seated   Retraction AROM;Both;20 reps   5 sec hold   Other Seated Exercises Shoulder blade pinches 10X 5 seconds      Shoulder Exercises: Pulleys   Flexion Other (comment)   10X 10 seconds   Scaption Other (comment)   10X 10 seconds     Vasopneumatic   Number Minutes Vasopneumatic  10 minutes    Vasopnuematic Location  Shoulder    Vasopneumatic Pressure Medium    Vasopneumatic Temperature  34      Manual Therapy   Passive ROM R shoulder IR/ER to comfort                  PT Education - 09/16/20 1658    Education Details Added scapular protraction to HEP, reviewed current program and progress    Person(s) Educated  Patient;Spouse    Methods Explanation;Demonstration;Tactile cues;Verbal cues;Handout    Comprehension Verbal cues required;Need further instruction;Returned demonstration;Verbalized understanding;Tactile cues required            PT Short Term Goals - 09/16/20 1659      PT SHORT TERM GOAL #1   Title Patient will be I with initial progression of HEP    Time 4    Period Weeks    Status Achieved    Target Date 09/21/20      PT SHORT TERM GOAL #2   Title Patient will demonstrate improved Rt shoulder PROM    Baseline see assessment    Time 4    Period Weeks    Status Achieved    Target Date 09/21/20             PT Long Term Goals - 09/16/20 1659      PT LONG TERM GOAL #1   Title Patient will show improved FOTO score to above 59%    Baseline 32 baseline    Time 16    Period Weeks    Status On-going      PT LONG TERM GOAL #2   Title Patient will demonstrate improved Rt shoulder strength to be 4+/5 for better ability to function with ADLs    Baseline unable to assess patient strength due to AROM limitations    Time 16    Period Weeks    Status On-going      PT LONG TERM GOAL #3   Title Patient will demonstrate AROM that is [facility] for ADLs    Baseline no AROM due to precautions    Time 16    Period Days    Status On-going      PT LONG TERM GOAL #4   Title Patient will demonstrate ability to perform daily tasks without restrictions of motion or pain increasing over 2/10    Baseline rest pain 3/10    Time 16    Period Weeks    Status On-going      PT LONG TERM GOAL #5   Title Patient will be I in her long term HEP for continued strengthening and maintenence of movement    Time 16    Period Weeks    Status On-going                 Plan - 09/16/20 1700    Clinical Impression Statement Denise Morales reports her pain is "much better" than before surgery.  Sleep  is still limited to about 3 hours uninterrupted and she can't use her R UE with many ADLs.  This is normal  6 weeks post-RTC repair.  AROM is as expected and AROM remains an emphasis with her PT.  Scapular strength was progressed slowly today with slow progression of RTC strengthening to come in the next few weeks.    Examination-Activity Limitations Bathing;Dressing;Hygiene/Grooming;Carry    Examination-Participation Restrictions Driving;Community Activity;Cleaning;Shop;Meal Prep;Laundry    Stability/Clinical Decision Making Stable/Uncomplicated    Rehab Potential Good    PT Frequency 2x / week    PT Duration Other (comment)   16 weeks   PT Treatment/Interventions ADLs/Self Care Home Management;Aquatic Therapy;Taping;Vasopneumatic Device;Dry needling;Passive range of motion;Scar mobilization;Patient/family education;Manual techniques;Neuromuscular re-education;Therapeutic exercise;Therapeutic activities;Functional mobility training;Ultrasound;Moist Heat;Iontophoresis 4mg /ml Dexamethasone;Electrical Stimulation;Cryotherapy;Biofeedback    PT Next Visit Plan AAROM until MD appt on 3/24. Progress as indicated by MD after appt.    PT Home Exercise Plan Access Code: [email or web address]    Consulted and Agree with Plan of Care Patient           Patient will benefit from skilled therapeutic intervention in order to improve the following deficits and impairments:  Decreased range of motion,Impaired UE functional use,Pain,Impaired flexibility,Increased edema,Decreased strength,Decreased mobility  Visit Diagnosis: Abnormal posture  Muscle weakness (generalized)  Localized edema  Stiffness of right shoulder, not elsewhere classified  Chronic right shoulder pain     Problem List Patient Active Problem List   Diagnosis Date Noted  . Complete tear of right rotator cuff 08/12/2020  . Hypertensive disorder 05/05/2019    Farley Ly PT, MPT 09/16/2020, 5:03 PM  [facility] Physical Therapy [address] [place], [place], [address] Phone: [phone]   Fax:  [phone]  Name: Denise Morales MRN: [id number] Date of Birth: [birth year]

## 2020-09-17 ENCOUNTER — Telehealth: Payer: Self-pay

## 2020-09-17 NOTE — Telephone Encounter (Signed)
Received Monovisc injections from [facility] for bilateral knee. Patient prefers to have injection for left knee only.  Appt. 09/23/2020 with Dr. Junius Roads

## 2020-09-20 ENCOUNTER — Ambulatory Visit (INDEPENDENT_AMBULATORY_CARE_PROVIDER_SITE_OTHER): Payer: No Typology Code available for payment source | Admitting: Physical Therapy

## 2020-09-20 ENCOUNTER — Other Ambulatory Visit: Payer: Self-pay

## 2020-09-20 DIAGNOSIS — M25611 Stiffness of right shoulder, not elsewhere classified: Secondary | ICD-10-CM

## 2020-09-20 DIAGNOSIS — M25511 Pain in right shoulder: Secondary | ICD-10-CM

## 2020-09-20 DIAGNOSIS — M6281 Muscle weakness (generalized): Secondary | ICD-10-CM

## 2020-09-20 DIAGNOSIS — R600 Localized edema: Secondary | ICD-10-CM

## 2020-09-20 NOTE — Therapy (Signed)
[facility] Physical Therapy [address] [place], [place], [address] Phone: [phone]   Fax:  [phone]  Physical Therapy Treatment  Patient Details  Name: Denise Morales MRN: [id number] Date of Birth: Oct 29, 1960 Referring Provider (PT): Jeronimo Norma, MD   Encounter Date: 09/20/2020   PT End of Session - 09/20/20 1616    Visit Number 8    Number of Visits 32    Date for PT Re-Evaluation 12/13/20    Progress Note Due on Visit 10    PT Start Time 2025    PT Stop Time 1605    PT Time Calculation (min) 50 min    Equipment Utilized During Treatment --   Sling   Activity Tolerance Patient tolerated treatment well;No increased pain    Behavior During Therapy WFL for tasks assessed/performed           Past Medical History:  Diagnosis Date  . Depression   . GERD (gastroesophageal reflux disease)   . Hypertension     Past Surgical History:  Procedure Laterality Date  . ABDOMINAL HYSTERECTOMY  1985  . CHOLECYSTECTOMY    . SHOULDER ARTHROSCOPY WITH ROTATOR CUFF REPAIR AND SUBACROMIAL DECOMPRESSION Right 08/12/2020   Procedure: RIGHT SHOULDER ARTHROSCOPY WITH ROTATOR CUFF REPAIR AND SUBACROMIAL DECOMPRESSION;  Surgeon: Mcarthur Rossetti, MD;  Location: [place];  Service: Orthopedics;  Laterality: Right;    There were no vitals filed for this visit.   Subjective Assessment - 09/20/20 1531    Subjective She relays 2/10 pain in Rt shoulder. She says MD seemed pleased with her shoulder but she will need a TKA once she finishes PT for her RTC repair.    Patient is accompained by: Family member   Denise Morales, husband   Pertinent History HTN    Limitations House hold activities;Lifting    Diagnostic tests MRI 06/14/2020 R shoulder: Full-thickness full width rupture of the supraspinatus tendon  retracted back 1.6 cm. No muscular atrophy or edema.  2. Intrasubstance partial thickness tearing of the infraspinatus  tendon along the  myotendinous junction.  3. Mild subscapularis tendinopathy.  4. 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    Patient Stated Goals be able to use her arm, lift things, dress and groom herself etc.    Pain Onset More than a month ago              [facility] PT Assessment - 09/20/20 0001      Assessment   Medical Diagnosis Rt. Rotator cuff repair    Referring Provider (PT) Jeronimo Norma, MD    Onset Date/Surgical Date 08/12/20      ROM / Strength   AROM / PROM / Strength AROM      AROM   AROM Assessment Site Shoulder    Right/Left Shoulder Right    Right Shoulder Flexion 100 Degrees   before shrug   Right Shoulder ABduction 100 Degrees   before shrug            [facility] Adult PT Treatment/Exercise - 09/20/20 0001      Shoulder Exercises: Seated   Flexion Both    Flexion Limitations 3 sets of 5 with cues to avoid shrug    Abduction Both;AROM    ABduction Limitations 3 sets of 5 with cues to avoid shrug      Shoulder Exercises: Sidelying   External Rotation Right;10 reps;AROM    External Rotation Limitations 2 sets      Shoulder Exercises: Standing   Other  Standing Exercises wall slide flexion and abd for Rt, X 10 ea holding 5 sec      Shoulder Exercises: Pulleys   Flexion 2 minutes    ABduction 2 minutes      Shoulder Exercises: ROM/Strengthening   UBE (Upper Arm Bike) no resistance 2 min fwd, 2 min retro      Shoulder Exercises: Stretch   Internal Rotation Stretch Limitations 10 reps, 5 sec behind back with strap    Other Shoulder Stretches doorway stretch on Rt for ER 10 sec X 10      Vasopneumatic   Number Minutes Vasopneumatic  10 minutes    Vasopnuematic Location  Shoulder    Vasopneumatic Pressure Medium    Vasopneumatic Temperature  34      Manual Therapy   Passive ROM Rt shoulder PROM all planes to tolerance                    PT Short Term Goals - 09/16/20 1659      PT SHORT TERM GOAL #1   Title Patient will be I  with initial progression of HEP    Time 4    Period Weeks    Status Achieved    Target Date 09/21/20      PT SHORT TERM GOAL #2   Title Patient will demonstrate improved Rt shoulder PROM    Baseline see assessment    Time 4    Period Weeks    Status Achieved    Target Date 09/21/20             PT Long Term Goals - 09/16/20 1659      PT LONG TERM GOAL #1   Title Patient will show improved FOTO score to above 59%    Baseline 32 baseline    Time 16    Period Weeks    Status On-going      PT LONG TERM GOAL #2   Title Patient will demonstrate improved Rt shoulder strength to be 4+/5 for better ability to function with ADLs    Baseline unable to assess patient strength due to AROM limitations    Time 16    Period Weeks    Status On-going      PT LONG TERM GOAL #3   Title Patient will demonstrate AROM that is [facility] for ADLs    Baseline no AROM due to precautions    Time 16    Period Days    Status On-going      PT LONG TERM GOAL #4   Title Patient will demonstrate ability to perform daily tasks without restrictions of motion or pain increasing over 2/10    Baseline rest pain 3/10    Time 16    Period Weeks    Status On-going      PT LONG TERM GOAL #5   Title Patient will be I in her long term HEP for continued strengthening and maintenence of movement    Time 16    Period Weeks    Status On-going                 Plan - 09/20/20 1617    Clinical Impression Statement She had good report from MD follow up with her MD 6 weeks post op RTC repar. Progressed to AROM today and she had good tolerance to this. She was provided HEP progression to now include AROM now that she is 6 weeks out. PT will continue to progress  as able per protocol.    Examination-Activity Limitations Bathing;Dressing;Hygiene/Grooming;Carry    Examination-Participation Restrictions Driving;Community Activity;Cleaning;Shop;Meal Prep;Laundry    Stability/Clinical Decision Making  Stable/Uncomplicated    Rehab Potential Good    PT Frequency 2x / week    PT Duration Other (comment)   16 weeks   PT Treatment/Interventions ADLs/Self Care Home Management;Aquatic Therapy;Taping;Vasopneumatic Device;Dry needling;Passive range of motion;Scar mobilization;Patient/family education;Manual techniques;Neuromuscular re-education;Therapeutic exercise;Therapeutic activities;Functional mobility training;Ultrasound;Moist Heat;Iontophoresis 4mg /ml Dexamethasone;Electrical Stimulation;Cryotherapy;Biofeedback    PT Next Visit Plan AAROM until MD appt on 3/24. Progress as indicated by MD after appt.    PT Home Exercise Plan Access Code: [email or web address]    Consulted and Agree with Plan of Care Patient           Patient will benefit from skilled therapeutic intervention in order to improve the following deficits and impairments:  Decreased range of motion,Impaired UE functional use,Pain,Impaired flexibility,Increased edema,Decreased strength,Decreased mobility  Visit Diagnosis: Muscle weakness (generalized)  Localized edema  Stiffness of right shoulder, not elsewhere classified  Acute pain of right shoulder     Problem List Patient Active Problem List   Diagnosis Date Noted  . Complete tear of right rotator cuff 08/12/2020  . Hypertensive disorder 05/05/2019    Denise Morales 09/20/2020, 4:18 PM  [facility] Physical Therapy [address] [place], [place], [address] Phone: [phone]   Fax:  [phone]  Name: Denise Morales MRN: [id number] Date of Birth: 1960/08/30

## 2020-09-22 ENCOUNTER — Other Ambulatory Visit: Payer: Self-pay

## 2020-09-22 ENCOUNTER — Ambulatory Visit (INDEPENDENT_AMBULATORY_CARE_PROVIDER_SITE_OTHER): Payer: Self-pay | Admitting: Physical Therapy

## 2020-09-22 DIAGNOSIS — R293 Abnormal posture: Secondary | ICD-10-CM

## 2020-09-22 DIAGNOSIS — R600 Localized edema: Secondary | ICD-10-CM

## 2020-09-22 DIAGNOSIS — M25511 Pain in right shoulder: Secondary | ICD-10-CM

## 2020-09-22 DIAGNOSIS — M6281 Muscle weakness (generalized): Secondary | ICD-10-CM

## 2020-09-22 DIAGNOSIS — M25611 Stiffness of right shoulder, not elsewhere classified: Secondary | ICD-10-CM

## 2020-09-22 NOTE — Therapy (Signed)
[facility] Physical Therapy [address] [place], [place], [address] Phone: [phone]   Fax:  [phone]  Physical Therapy Treatment  Patient Details  Name: Denise Morales MRN: [id number] Date of Birth: 1961-06-04 Referring Provider (PT): Jeronimo Norma, MD   Encounter Date: 09/22/2020   PT End of Session - 09/22/20 1541    Visit Number 9    Number of Visits 32    Date for PT Re-Evaluation 12/13/20    Authorization Type CAFA 3/3 to 02/26/21    Progress Note Due on Visit 10    PT Start Time 1516    PT Stop Time 1600    PT Time Calculation (min) 44 min    Equipment Utilized During Treatment --   Sling   Activity Tolerance Patient tolerated treatment well    Behavior During Therapy [facility] for tasks assessed/performed           Past Medical History:  Diagnosis Date  . Depression   . GERD (gastroesophageal reflux disease)   . Hypertension     Past Surgical History:  Procedure Laterality Date  . ABDOMINAL HYSTERECTOMY  1985  . CHOLECYSTECTOMY    . SHOULDER ARTHROSCOPY WITH ROTATOR CUFF REPAIR AND SUBACROMIAL DECOMPRESSION Right 08/12/2020   Procedure: RIGHT SHOULDER ARTHROSCOPY WITH ROTATOR CUFF REPAIR AND SUBACROMIAL DECOMPRESSION;  Surgeon: Mcarthur Rossetti, MD;  Location: [place];  Service: Orthopedics;  Laterality: Right;    There were no vitals filed for this visit.   Subjective Assessment - 09/22/20 1540    Subjective She relaysoverall 4/10 pain/sorness in her Rt shoulder after beginning AROM last visit/    Patient is accompained by: Family member   [name], husband   Pertinent History HTN    Limitations House hold activities;Lifting    Diagnostic tests MRI 06/14/2020 R shoulder: Full-thickness full width rupture of the supraspinatus tendon  retracted back 1.6 cm. No muscular atrophy or edema.  2. Intrasubstance partial thickness tearing of the infraspinatus  tendon along the myotendinous junction.  3. Mild  subscapularis tendinopathy.  4. 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    Patient Stated Goals be able to use her arm, lift things, dress and groom herself etc.    Pain Onset More than a month ago            [facility] Adult PT Treatment/Exercise - 09/22/20 0001      Shoulder Exercises: Supine   External Rotation AAROM;Right;15 reps    Flexion AAROM;Right;15 reps    ABduction Right;15 reps;AAROM      Shoulder Exercises: Sidelying   External Rotation Right;10 reps;AROM    External Rotation Limitations 2 sets      Shoulder Exercises: Pulleys   Flexion 2 minutes    ABduction 2 minutes      Shoulder Exercises: ROM/Strengthening   UBE (Upper Arm Bike) L1 2 min fwd, 2 min retro      Vasopneumatic   Number Minutes Vasopneumatic  10 minutes    Vasopnuematic Location  Shoulder    Vasopneumatic Pressure Medium    Vasopneumatic Temperature  34      Manual Therapy   Passive ROM Rt shoulder PROM all planes to tolerance                    PT Short Term Goals - 09/16/20 1659      PT SHORT TERM GOAL #1   Title Patient will be I with initial progression of HEP    Time 4  Period Weeks    Status Achieved    Target Date 09/21/20      PT SHORT TERM GOAL #2   Title Patient will demonstrate improved Rt shoulder PROM    Baseline see assessment    Time 4    Period Weeks    Status Achieved    Target Date 09/21/20             PT Long Term Goals - 09/16/20 1659      PT LONG TERM GOAL #1   Title Patient will show improved FOTO score to above 59%    Baseline 32 baseline    Time 16    Period Weeks    Status On-going      PT LONG TERM GOAL #2   Title Patient will demonstrate improved Rt shoulder strength to be 4+/5 for better ability to function with ADLs    Baseline unable to assess patient strength due to AROM limitations    Time 16    Period Weeks    Status On-going      PT LONG TERM GOAL #3   Title Patient will demonstrate AROM that is [facility]  for ADLs    Baseline no AROM due to precautions    Time 16    Period Days    Status On-going      PT LONG TERM GOAL #4   Title Patient will demonstrate ability to perform daily tasks without restrictions of motion or pain increasing over 2/10    Baseline rest pain 3/10    Time 16    Period Weeks    Status On-going      PT LONG TERM GOAL #5   Title Patient will be I in her long term HEP for continued strengthening and maintenence of movement    Time 16    Period Weeks    Status On-going                 Plan - 09/22/20 1551    Clinical Impression Statement She had more soreness after beginning AROM so backed down today to [facility] with good overall tolerance to this. As soreness calms down we will add back AROM and she relays understanding of this with HEP.    Examination-Activity Limitations Bathing;Dressing;Hygiene/Grooming;Carry    Examination-Participation Restrictions Driving;Community Activity;Cleaning;Shop;Meal Prep;Laundry    Stability/Clinical Decision Making Stable/Uncomplicated    Rehab Potential Good    PT Frequency 2x / week    PT Duration Other (comment)   16 weeks   PT Treatment/Interventions ADLs/Self Care Home Management;Aquatic Therapy;Taping;Vasopneumatic Device;Dry needling;Passive range of motion;Scar mobilization;Patient/family education;Manual techniques;Neuromuscular re-education;Therapeutic exercise;Therapeutic activities;Functional mobility training;Ultrasound;Moist Heat;Iontophoresis 4mg /ml Dexamethasone;Electrical Stimulation;Cryotherapy;Biofeedback    PT Next Visit Plan AAROM until MD appt on 3/24. Progress as indicated by MD after appt.    PT Home Exercise Plan Access Code: [email or web address]    Consulted and Agree with Plan of Care Patient           Patient will benefit from skilled therapeutic intervention in order to improve the following deficits and impairments:  Decreased range of motion,Impaired UE functional use,Pain,Impaired flexibility,Increased  edema,Decreased strength,Decreased mobility  Visit Diagnosis: Muscle weakness (generalized)  Acute pain of right shoulder  Stiffness of right shoulder, not elsewhere classified  Abnormal posture  Localized edema     Problem List Patient Active Problem List   Diagnosis Date Noted  . Complete tear of right rotator cuff 08/12/2020  . Hypertensive disorder 05/05/2019    Silvestre Mesi 09/22/2020, 3:52  PM  [facility] Physical Therapy [address] [place], [place], [address] Phone: [phone]   Fax:  [phone]  Name: Denise Morales MRN: [id number] Date of Birth: Jun 10, 1961

## 2020-09-23 ENCOUNTER — Ambulatory Visit (INDEPENDENT_AMBULATORY_CARE_PROVIDER_SITE_OTHER): Payer: Self-pay | Admitting: Family Medicine

## 2020-09-23 DIAGNOSIS — M1712 Unilateral primary osteoarthritis, left knee: Secondary | ICD-10-CM

## 2020-09-23 DIAGNOSIS — M1711 Unilateral primary osteoarthritis, right knee: Secondary | ICD-10-CM

## 2020-09-23 MED ORDER — BACLOFEN 10 MG PO TABS: ORAL_TABLET | ORAL | 3 refills | Status: DC

## 2020-09-23 NOTE — Progress Notes (Signed)
   Office Visit Note   Patient: Denise Morales           Date of Birth: [birth year]           MRN: [id number] Visit Date: 09/23/2020 Requested by: Kerin Perna, NP [address] [place],  [place] [address] PCP: Kerin Perna, NP  Subjective: Chief Complaint  Patient presents with  . Left Knee - Pain, Follow-up    Planned Monovisc injection    HPI: She is here for planned bilateral knee Monovisc injections.  Her right 1 generally hurts more than the left and she will ultimately need a knee replacement, but she cannot have that done until her shoulder has recovered.                ROS:   All other systems were reviewed and are negative.  Objective: Vital Signs: There were no vitals taken for this visit.  Physical Exam:  General:  Alert and oriented, in no acute distress. Pulm:  Breathing unlabored. Psy:  Normal mood, congruent affect. Skin: No erythema Knees: Trace effusion bilaterally.  Imaging: No results found.  Assessment & Plan: 1.  Bilateral knee osteoarthritis -Monovisc injections given today.  Follow-up as needed.     Procedures: Bilateral knee injections: After sterile prep with Betadine, injected 3 cc 0.25% bupivacaine and Monovisc from lateral midpatellar approach.       PMFS History: Patient Active Problem List   Diagnosis Date Noted  . Complete tear of right rotator cuff 08/12/2020  . Hypertensive disorder 05/05/2019   Past Medical History:  Diagnosis Date  . Depression   . GERD (gastroesophageal reflux disease)   . Hypertension     Family History  Problem Relation Age of Onset  . Diabetes Father   . Diabetes Sister   . Diabetes Paternal Aunt     Past Surgical History:  Procedure Laterality Date  . ABDOMINAL HYSTERECTOMY  1985  . CHOLECYSTECTOMY    . SHOULDER ARTHROSCOPY WITH ROTATOR CUFF REPAIR AND SUBACROMIAL DECOMPRESSION Right 08/12/2020   Procedure: RIGHT SHOULDER ARTHROSCOPY WITH ROTATOR CUFF REPAIR AND SUBACROMIAL  DECOMPRESSION;  Surgeon: Mcarthur Rossetti, MD;  Location: [place];  Service: Orthopedics;  Laterality: Right;   Social History   Occupational History  . Not on file  Tobacco Use  . Smoking status: Never Smoker  . Smokeless tobacco: Never Used  Substance and Sexual Activity  . Alcohol use: Not Currently  . Drug use: Never  . Sexual activity: Not Currently

## 2020-09-27 ENCOUNTER — Ambulatory Visit (INDEPENDENT_AMBULATORY_CARE_PROVIDER_SITE_OTHER): Payer: Self-pay | Admitting: Physical Therapy

## 2020-09-27 ENCOUNTER — Other Ambulatory Visit: Payer: Self-pay

## 2020-09-27 DIAGNOSIS — M25611 Stiffness of right shoulder, not elsewhere classified: Secondary | ICD-10-CM

## 2020-09-27 DIAGNOSIS — M25511 Pain in right shoulder: Secondary | ICD-10-CM

## 2020-09-27 DIAGNOSIS — R600 Localized edema: Secondary | ICD-10-CM

## 2020-09-27 DIAGNOSIS — M6281 Muscle weakness (generalized): Secondary | ICD-10-CM

## 2020-09-27 DIAGNOSIS — R293 Abnormal posture: Secondary | ICD-10-CM

## 2020-09-27 NOTE — Therapy (Signed)
[facility] Physical Therapy [address] [place], [place], [address] Phone: [phone]   Fax:  [phone]  Physical Therapy Treatment  Patient Details  Name: Denise Morales MRN: [id number] Date of Birth: May 25, 1961 Referring Provider (PT): Jeronimo Norma, MD   Encounter Date: 09/27/2020   PT End of Session - 09/27/20 1614    Visit Number 10    Number of Visits 32    Date for PT Re-Evaluation 12/13/20    Authorization Type CAFA 3/3 to 02/26/21    Progress Note Due on Visit 10    PT Start Time 1555    PT Stop Time 1640    PT Time Calculation (min) 45 min    Equipment Utilized During Treatment --   Sling   Activity Tolerance Patient tolerated treatment well    Behavior During Therapy [facility] for tasks assessed/performed           Past Medical History:  Diagnosis Date  . Depression   . GERD (gastroesophageal reflux disease)   . Hypertension     Past Surgical History:  Procedure Laterality Date  . ABDOMINAL HYSTERECTOMY  1985  . CHOLECYSTECTOMY    . SHOULDER ARTHROSCOPY WITH ROTATOR CUFF REPAIR AND SUBACROMIAL DECOMPRESSION Right 08/12/2020   Procedure: RIGHT SHOULDER ARTHROSCOPY WITH ROTATOR CUFF REPAIR AND SUBACROMIAL DECOMPRESSION;  Surgeon: Mcarthur Rossetti, MD;  Location: [place];  Service: Orthopedics;  Laterality: Right;    There were no vitals filed for this visit.   Subjective Assessment - 09/27/20 1559    Subjective She relays she had injections for her knee, it helped her Rt but not the left. Her Rt shoulder is doing ok today post op RTC repair and she has been compliant with exercises    Patient is accompained by: Family member   [name], husband   Pertinent History HTN    Limitations House hold activities;Lifting    Diagnostic tests MRI 06/14/2020 R shoulder: Full-thickness full width rupture of the supraspinatus tendon  retracted back 1.6 cm. No muscular atrophy or edema.  2. Intrasubstance partial thickness  tearing of the infraspinatus  tendon along the myotendinous junction.  3. Mild subscapularis tendinopathy.  4. 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    Patient Stated Goals be able to use her arm, lift things, dress and groom herself etc.    Pain Onset More than a month ago              [facility] PT Assessment - 09/27/20 0001      Assessment   Medical Diagnosis Rt. Rotator cuff repair    Referring Provider (PT) Jeronimo Norma, MD    Onset Date/Surgical Date 08/12/20      AROM   Right Shoulder Flexion 130 Degrees    Right Shoulder ABduction 120 Degrees      PROM   Right Shoulder Flexion 150 Degrees    Right Shoulder ABduction --   [facility]   Right Shoulder Internal Rotation --   [facility]   Right Shoulder External Rotation --   [facility]     Strength   Right/Left Shoulder Right    Right Shoulder Flexion 3/5    Right Shoulder ABduction 3/5    Right Shoulder Internal Rotation 4/5    Right Shoulder External Rotation 4/5            [facility] Adult PT Treatment/Exercise - 09/27/20 0001      Shoulder Exercises: Supine   Flexion AAROM;Right;15 reps    ABduction Right;15 reps;AAROM  Shoulder Exercises: Seated   Extension Both;20 reps    Theraband Level (Shoulder Extension) Level 2 (Red)    Row Both;20 reps    Theraband Level (Shoulder Row) Level 2 (Red)    External Rotation Right;20 reps    Theraband Level (Shoulder External Rotation) Level 2 (Red)    Internal Rotation Right;20 reps    Theraband Level (Shoulder Internal Rotation) Level 2 (Red)    Other Seated Exercises wall slides flexion and abd 10 reps holding 5 sec, seated ER table stretch 5 sec X 15      Shoulder Exercises: ROM/Strengthening   UBE (Upper Arm Bike) L2 3 min fwd, 3 min retro      Vasopneumatic   Number Minutes Vasopneumatic  10 minutes    Vasopnuematic Location  Shoulder    Vasopneumatic Pressure Medium    Vasopneumatic Temperature  34      Manual Therapy   Passive ROM Rt shoulder PROM  all planes to tolerance             PT Short Term Goals - 09/16/20 1659      PT SHORT TERM GOAL #1   Title Patient will be I with initial progression of HEP    Time 4    Period Weeks    Status Achieved    Target Date 09/21/20      PT SHORT TERM GOAL #2   Title Patient will demonstrate improved Rt shoulder PROM    Baseline see assessment    Time 4    Period Weeks    Status Achieved    Target Date 09/21/20             PT Long Term Goals - 09/16/20 1659      PT LONG TERM GOAL #1   Title Patient will show improved FOTO score to above 59%    Baseline 32 baseline    Time 16    Period Weeks    Status On-going      PT LONG TERM GOAL #2   Title Patient will demonstrate improved Rt shoulder strength to be 4+/5 for better ability to function with ADLs    Baseline unable to assess patient strength due to AROM limitations    Time 16    Period Weeks    Status On-going      PT LONG TERM GOAL #3   Title Patient will demonstrate AROM that is [facility] for ADLs    Baseline no AROM due to precautions    Time 16    Period Days    Status On-going      PT LONG TERM GOAL #4   Title Patient will demonstrate ability to perform daily tasks without restrictions of motion or pain increasing over 2/10    Baseline rest pain 3/10    Time 16    Period Weeks    Status On-going      PT LONG TERM GOAL #5   Title Patient will be I in her long term HEP for continued strengthening and maintenence of movement    Time 16    Period Weeks    Status On-going                 Plan - 09/27/20 1634    Clinical Impression Statement Her Rt shoulder has made good progress since last visit and she showed improved overall ROM and strength noted today. Still with deficits and PT recommending 4 more weeks of PT. Modified her exercieses to  seated versions today from standing as she is limited by bilat knee pain.    Examination-Activity Limitations Bathing;Dressing;Hygiene/Grooming;Carry     Examination-Participation Restrictions Driving;Community Activity;Cleaning;Shop;Meal Prep;Laundry    Stability/Clinical Decision Making Stable/Uncomplicated    Rehab Potential Good    PT Frequency 2x / week    PT Duration Other (comment)   16 weeks   PT Treatment/Interventions ADLs/Self Care Home Management;Aquatic Therapy;Taping;Vasopneumatic Device;Dry needling;Passive range of motion;Scar mobilization;Patient/family education;Manual techniques;Neuromuscular re-education;Therapeutic exercise;Therapeutic activities;Functional mobility training;Ultrasound;Moist Heat;Iontophoresis 4mg /ml Dexamethasone;Electrical Stimulation;Cryotherapy;Biofeedback    PT Next Visit Plan progress strength and shoulder ROM as tolerated.    PT Home Exercise Plan Access Code: [email or web address]    Consulted and Agree with Plan of Care Patient           Patient will benefit from skilled therapeutic intervention in order to improve the following deficits and impairments:  Decreased range of motion,Impaired UE functional use,Pain,Impaired flexibility,Increased edema,Decreased strength,Decreased mobility  Visit Diagnosis: Muscle weakness (generalized)  Acute pain of right shoulder  Stiffness of right shoulder, not elsewhere classified  Abnormal posture  Localized edema     Problem List Patient Active Problem List   Diagnosis Date Noted  . Complete tear of right rotator cuff 08/12/2020  . Hypertensive disorder 05/05/2019    Silvestre Mesi 09/27/2020, 4:37 PM  [facility] Physical Therapy [address] [place], [place], [address] Phone: [phone]   Fax:  [phone]  Name: Madalina Rosman MRN: [id number] Date of Birth: Aug 01, 1960

## 2020-09-29 ENCOUNTER — Ambulatory Visit (INDEPENDENT_AMBULATORY_CARE_PROVIDER_SITE_OTHER): Payer: Self-pay | Admitting: Physical Therapy

## 2020-09-29 ENCOUNTER — Other Ambulatory Visit (INDEPENDENT_AMBULATORY_CARE_PROVIDER_SITE_OTHER): Payer: Self-pay | Admitting: Primary Care

## 2020-09-29 ENCOUNTER — Other Ambulatory Visit: Payer: Self-pay

## 2020-09-29 ENCOUNTER — Encounter: Payer: Self-pay | Admitting: Physical Therapy

## 2020-09-29 DIAGNOSIS — M6281 Muscle weakness (generalized): Secondary | ICD-10-CM

## 2020-09-29 DIAGNOSIS — I10 Essential (primary) hypertension: Secondary | ICD-10-CM

## 2020-09-29 DIAGNOSIS — R293 Abnormal posture: Secondary | ICD-10-CM

## 2020-09-29 DIAGNOSIS — M25611 Stiffness of right shoulder, not elsewhere classified: Secondary | ICD-10-CM

## 2020-09-29 DIAGNOSIS — R600 Localized edema: Secondary | ICD-10-CM

## 2020-09-29 DIAGNOSIS — M25511 Pain in right shoulder: Secondary | ICD-10-CM

## 2020-09-29 DIAGNOSIS — G8929 Other chronic pain: Secondary | ICD-10-CM

## 2020-09-29 NOTE — Therapy (Signed)
[facility] Physical Therapy [address] [place], [place], [address] Phone: [phone]   Fax:  [phone]  Physical Therapy Treatment  Patient Details  Name: Denise Morales MRN: [id number] Date of Birth: [birth year] Referring Provider (PT): Jeronimo Norma, MD   Encounter Date: 09/29/2020   PT End of Session - 09/29/20 1614    Visit Number 11    Number of Visits 32    Date for PT Re-Evaluation 12/13/20    Authorization Type CAFA 3/3 to 02/26/21    PT Start Time 1555    PT Stop Time 1640    PT Time Calculation (min) 45 min    Equipment Utilized During Treatment --   Sling   Activity Tolerance Patient tolerated treatment well    Behavior During Therapy [facility] for tasks assessed/performed           Past Medical History:  Diagnosis Date  . Depression   . GERD (gastroesophageal reflux disease)   . Hypertension     Past Surgical History:  Procedure Laterality Date  . ABDOMINAL HYSTERECTOMY  1985  . CHOLECYSTECTOMY    . SHOULDER ARTHROSCOPY WITH ROTATOR CUFF REPAIR AND SUBACROMIAL DECOMPRESSION Right 08/12/2020   Procedure: RIGHT SHOULDER ARTHROSCOPY WITH ROTATOR CUFF REPAIR AND SUBACROMIAL DECOMPRESSION;  Surgeon: Mcarthur Rossetti, MD;  Location: [place];  Service: Orthopedics;  Laterality: Right;    There were no vitals filed for this visit.   Subjective Assessment - 09/29/20 1612    Subjective She relays her Rt shoulder is improving. Not much pain in it today    Patient is accompained by: Family member   Denise Morales, husband   Pertinent History HTN    Limitations House hold activities;Lifting    Diagnostic tests MRI 06/14/2020 R shoulder: Full-thickness full width rupture of the supraspinatus tendon  retracted back 1.6 cm. No muscular atrophy or edema.  2. Intrasubstance partial thickness tearing of the infraspinatus  tendon along the myotendinous junction.  3. Mild subscapularis tendinopathy.  4. Mild degenerative AC joint and  glenohumeral joint arthropathy.    Patient Stated Goals be able to use her arm, lift things, dress and groom herself etc.    Pain Onset More than a month ago            [facility] Adult PT Treatment/Exercise - 09/29/20 0001      Shoulder Exercises: Supine   External Rotation AAROM;Right;10 reps    External Rotation Weight (lbs) 3    Flexion AAROM;Right;10 reps    Shoulder Flexion Weight (lbs) 3    ABduction AAROM;Right;10 reps    Shoulder ABduction Weight (lbs) 3      Shoulder Exercises: Seated   Extension Both;20 reps    Theraband Level (Shoulder Extension) Level 2 (Red)    Row Both;20 reps    Theraband Level (Shoulder Row) Level 2 (Red)    External Rotation Right;20 reps    Theraband Level (Shoulder External Rotation) Level 2 (Red)    Flexion AROM;Both;10 reps    Abduction AAROM;Both;10 reps      Shoulder Exercises: Pulleys   Flexion 2 minutes    ABduction 2 minutes      Shoulder Exercises: ROM/Strengthening   UBE (Upper Arm Bike) L2 3 min fwd, 3 min retro      Vasopneumatic   Number Minutes Vasopneumatic  10 minutes    Vasopnuematic Location  Shoulder    Vasopneumatic Pressure Medium    Vasopneumatic Temperature  34      Manual Therapy  Passive ROM Rt shoulder PROM all planes to tolerance                    PT Short Term Goals - 09/16/20 1659      PT SHORT TERM GOAL #1   Title Patient will be I with initial progression of HEP    Time 4    Period Weeks    Status Achieved    Target Date 09/21/20      PT SHORT TERM GOAL #2   Title Patient will demonstrate improved Rt shoulder PROM    Baseline see assessment    Time 4    Period Weeks    Status Achieved    Target Date 09/21/20             PT Long Term Goals - 09/16/20 1659      PT LONG TERM GOAL #1   Title Patient will show improved FOTO score to above 59%    Baseline 32 baseline    Time 16    Period Weeks    Status On-going      PT LONG TERM GOAL #2   Title Patient will demonstrate  improved Rt shoulder strength to be 4+/5 for better ability to function with ADLs    Baseline unable to assess patient strength due to AROM limitations    Time 16    Period Weeks    Status On-going      PT LONG TERM GOAL #3   Title Patient will demonstrate AROM that is [facility] for ADLs    Baseline no AROM due to precautions    Time 16    Period Days    Status On-going      PT LONG TERM GOAL #4   Title Patient will demonstrate ability to perform daily tasks without restrictions of motion or pain increasing over 2/10    Baseline rest pain 3/10    Time 16    Period Weeks    Status On-going      PT LONG TERM GOAL #5   Title Patient will be I in her long term HEP for continued strengthening and maintenence of movement    Time 16    Period Weeks    Status On-going                 Plan - 09/29/20 1615    Clinical Impression Statement progressed Rt shoulder strengthening program with good tolerance and without complaints. Her ROM continues to progress well post op and she can now wash her hair. Will progress more AROM and strengthening as she can tolerate it.    Examination-Activity Limitations Bathing;Dressing;Hygiene/Grooming;Carry    Examination-Participation Restrictions Driving;Community Activity;Cleaning;Shop;Meal Prep;Laundry    Stability/Clinical Decision Making Stable/Uncomplicated    Rehab Potential Good    PT Frequency 2x / week    PT Duration Other (comment)   16 weeks   PT Treatment/Interventions ADLs/Self Care Home Management;Aquatic Therapy;Taping;Vasopneumatic Device;Dry needling;Passive range of motion;Scar mobilization;Patient/family education;Manual techniques;Neuromuscular re-education;Therapeutic exercise;Therapeutic activities;Functional mobility training;Ultrasound;Moist Heat;Iontophoresis 4mg /ml Dexamethasone;Electrical Stimulation;Cryotherapy;Biofeedback    PT Next Visit Plan progress strength and shoulder ROM as tolerated.    PT Home Exercise Plan Access  Code: [email or web address]    Consulted and Agree with Plan of Care Patient           Patient will benefit from skilled therapeutic intervention in order to improve the following deficits and impairments:  Decreased range of motion,Impaired UE functional use,Pain,Impaired flexibility,Increased edema,Decreased strength,Decreased mobility  Visit Diagnosis:  Muscle weakness (generalized)  Acute pain of right shoulder  Stiffness of right shoulder, not elsewhere classified  Abnormal posture  Localized edema  Chronic right shoulder pain     Problem List Patient Active Problem List   Diagnosis Date Noted  . Complete tear of right rotator cuff 08/12/2020  . Hypertensive disorder 05/05/2019    Silvestre Mesi 09/29/2020, 4:35 PM  [facility] Physical Therapy [address] [place], [place], [address] Phone: [phone]   Fax:  [phone]  Name: Denise Morales MRN: [id number] Date of Birth: 01-30-1961

## 2020-10-05 ENCOUNTER — Other Ambulatory Visit: Payer: Self-pay | Admitting: Family Medicine

## 2020-10-12 ENCOUNTER — Ambulatory Visit (INDEPENDENT_AMBULATORY_CARE_PROVIDER_SITE_OTHER): Payer: Self-pay | Admitting: Rehabilitative and Restorative Service Providers"

## 2020-10-12 ENCOUNTER — Other Ambulatory Visit: Payer: Self-pay

## 2020-10-12 ENCOUNTER — Encounter: Payer: Self-pay | Admitting: Rehabilitative and Restorative Service Providers"

## 2020-10-12 DIAGNOSIS — R600 Localized edema: Secondary | ICD-10-CM

## 2020-10-12 DIAGNOSIS — M25611 Stiffness of right shoulder, not elsewhere classified: Secondary | ICD-10-CM

## 2020-10-12 DIAGNOSIS — G8929 Other chronic pain: Secondary | ICD-10-CM

## 2020-10-12 DIAGNOSIS — M25511 Pain in right shoulder: Secondary | ICD-10-CM

## 2020-10-12 DIAGNOSIS — R293 Abnormal posture: Secondary | ICD-10-CM

## 2020-10-12 DIAGNOSIS — M6281 Muscle weakness (generalized): Secondary | ICD-10-CM

## 2020-10-12 NOTE — Therapy (Signed)
[facility] Physical Therapy [address] [place], [place], [address] Phone: [phone]   Fax:  [phone]  Physical Therapy Treatment  Patient Details  Name: Denise Morales MRN: [id number] Date of Birth: [birth year] Referring Provider (PT): Jeronimo Norma, MD   Encounter Date: 10/12/2020   PT End of Session - 10/12/20 1149    Visit Number 12    Number of Visits 32    Date for PT Re-Evaluation 12/13/20    Authorization Type CAFA 3/3 to 02/26/21    Progress Note Due on Visit 10    PT Start Time 1101    PT Stop Time 1155    PT Time Calculation (min) 54 min    Activity Tolerance Patient tolerated treatment well;No increased pain    Behavior During Therapy WFL for tasks assessed/performed           Past Medical History:  Diagnosis Date  . Depression   . GERD (gastroesophageal reflux disease)   . Hypertension     Past Surgical History:  Procedure Laterality Date  . ABDOMINAL HYSTERECTOMY  1985  . CHOLECYSTECTOMY    . SHOULDER ARTHROSCOPY WITH ROTATOR CUFF REPAIR AND SUBACROMIAL DECOMPRESSION Right 08/12/2020   Procedure: RIGHT SHOULDER ARTHROSCOPY WITH ROTATOR CUFF REPAIR AND SUBACROMIAL DECOMPRESSION;  Surgeon: Mcarthur Rossetti, MD;  Location: [place];  Service: Orthopedics;  Laterality: Right;    There were no vitals filed for this visit.   Subjective Assessment - 10/12/20 1704    Subjective It really bothered me this weekend but it's better today. I didn't do anything to really aggravate it.    Currently in Pain? Yes    Pain Score 3     Pain Location Shoulder    Pain Orientation Right    Pain Descriptors / Indicators Aching;Sore    Pain Type Chronic pain;Surgical pain    Pain Frequency Intermittent    Multiple Pain Sites No                             [facility] Adult PT Treatment/Exercise - 10/12/20 0001      Shoulder Exercises: Supine   Other Supine Exercises scap retract with 3 lb chest press  x 15, 3 lb ER x 20. scap retract with shoulder ext isometric x 20. 1 lb shoulder flex limited AROM with scap retract. scap retract with shoulder depression sliding hand on bed x 20 with 2-3 sec hold      Shoulder Exercises: Seated   Other Seated Exercises scap retract x 20, scap depression x 20 both to right with PT max verbal, visual and tactile cues for proper muscle faciitation and technique      Shoulder Exercises: Sidelying   Other Sidelying Exercises 1 lb shoulder abdct limited AROM with scap retract; L sidelying R ER iso using PT resistance 20x3 sec each R      Vasopneumatic   Number Minutes Vasopneumatic  10 minutes    Vasopnuematic Location  Shoulder    Vasopneumatic Pressure Medium    Vasopneumatic Temperature  34      Manual Therapy   Passive ROM R shoulder PROM all planes to tolerance                    PT Short Term Goals - 09/16/20 1659      PT SHORT TERM GOAL #1   Title Patient will be I with initial progression of HEP    Time  4    Period Weeks    Status Achieved    Target Date 09/21/20      PT SHORT TERM GOAL #2   Title Patient will demonstrate improved Rt shoulder PROM    Baseline see assessment    Time 4    Period Weeks    Status Achieved    Target Date 09/21/20             PT Long Term Goals - 10/12/20 1710      PT LONG TERM GOAL #1   Title Patient will show improved FOTO score to above 59%    Status On-going      PT LONG TERM GOAL #2   Title Patient will demonstrate improved Rt shoulder strength to be 4+/5 for better ability to function with ADLs    Status On-going      PT LONG TERM GOAL #3   Title Patient will demonstrate AROM that is [facility] for ADLs    Status On-going      PT LONG TERM GOAL #4   Title Patient will demonstrate ability to perform daily tasks without restrictions of motion or pain increasing over 2/10    Status On-going      PT LONG TERM GOAL #5   Title Patient will be I in her long term HEP for continued  strengthening and maintenence of movement    Status On-going                 Plan - 10/12/20 1708    Clinical Impression Statement Pt presents to PT with decreased R shoulder AROM and functional weakness. She arrived to PT with reports of increased shoulder pain this weekend. She was able to tolerate all activities this visit with decreased pain with modifications made with scapular retraction which helped to decrease her pain. Popping/clicking was noted in her shoulder with mobility but she reports has always been present. Continue to progress AROM/strengthening to tolerance.    Rehab Potential Good    PT Frequency 2x / week    PT Duration Other (comment)    PT Treatment/Interventions ADLs/Self Care Home Management;Aquatic Therapy;Taping;Vasopneumatic Device;Dry needling;Passive range of motion;Scar mobilization;Patient/family education;Manual techniques;Neuromuscular re-education;Therapeutic exercise;Therapeutic activities;Functional mobility training;Ultrasound;Moist Heat;Iontophoresis 4mg /ml Dexamethasone;Electrical Stimulation;Cryotherapy;Biofeedback    PT Next Visit Plan progress strength and shoulder ROM as tolerated.    Consulted and Agree with Plan of Care Patient           Patient will benefit from skilled therapeutic intervention in order to improve the following deficits and impairments:  Decreased range of motion,Impaired UE functional use,Pain,Impaired flexibility,Increased edema,Decreased strength,Decreased mobility  Visit Diagnosis: Muscle weakness (generalized)  Acute pain of right shoulder  Stiffness of right shoulder, not elsewhere classified  Abnormal posture  Localized edema  Chronic right shoulder pain     Problem List Patient Active Problem List   Diagnosis Date Noted  . Complete tear of right rotator cuff 08/12/2020  . Hypertensive disorder 05/05/2019    America Brown, PT, DPT 10/12/2020, 5:15 PM  [facility] Physical Therapy [address] [place], [place], [address] Phone: [phone]   Fax:  [phone]  Name: Lessie Funderburke MRN: [id number] Date of Birth: [birth year]

## 2020-10-14 ENCOUNTER — Encounter: Payer: Self-pay | Admitting: Orthopaedic Surgery

## 2020-10-14 ENCOUNTER — Ambulatory Visit (INDEPENDENT_AMBULATORY_CARE_PROVIDER_SITE_OTHER): Payer: Self-pay | Admitting: Orthopaedic Surgery

## 2020-10-14 DIAGNOSIS — M1711 Unilateral primary osteoarthritis, right knee: Secondary | ICD-10-CM

## 2020-10-14 DIAGNOSIS — M25562 Pain in left knee: Secondary | ICD-10-CM

## 2020-10-14 DIAGNOSIS — G8929 Other chronic pain: Secondary | ICD-10-CM

## 2020-10-14 DIAGNOSIS — S46011D Strain of muscle(s) and tendon(s) of the rotator cuff of right shoulder, subsequent encounter: Secondary | ICD-10-CM

## 2020-10-14 DIAGNOSIS — Z9889 Other specified postprocedural states: Secondary | ICD-10-CM

## 2020-10-14 MED ORDER — METHYLPREDNISOLONE ACETATE 40 MG/ML IJ SUSP
40.0000 mg | INTRAMUSCULAR | Status: AC | PRN
  Administered 2020-10-14: 40 mg via INTRA_ARTICULAR

## 2020-10-14 MED ORDER — LIDOCAINE HCL 1 % IJ SOLN
3.0000 mL | INTRAMUSCULAR | Status: AC | PRN
  Administered 2020-10-14: 3 mL

## 2020-10-14 NOTE — Progress Notes (Signed)
Office Visit Note   Patient: Denise Morales           Date of Birth: [birth year]           MRN: [id number] Visit Date: 10/14/2020              Requested by: Kerin Perna, NP [address] [place],  [place] [address] PCP: Kerin Perna, NP   Assessment & Plan: Visit Diagnoses:  1. Status post arthroscopy of right shoulder   2. Traumatic complete tear of right rotator cuff, subsequent encounter   3. Unilateral primary osteoarthritis, right knee   4. Chronic pain of left knee     Plan: Per the patient's request I did place a steroid injection in her left knee today.  I would like to see her back in 4 weeks to make sure that she is still making good progress with that right shoulder rotator cuff so we would be more comfortable scheduling out her knee replacement in terms of her putting pressure through her shoulders and a walker as she recovers from knee replacement surgery and she understands this as well.  Follow-Up Instructions: Return in about 4 weeks (around 11/11/2020).   Orders:  Orders Placed This Encounter  Procedures  . Large Joint Inj   No orders of the defined types were placed in this encounter.     Procedures: Large Joint Inj: L knee on 10/14/2020 3:03 PM Indications: diagnostic evaluation and pain Details: 22 G 1.5 in needle, superolateral approach  Arthrogram: No  Medications: 3 mL lidocaine 1 %; 40 mg methylPREDNISolone acetate 40 MG/ML Outcome: tolerated well, no immediate complications Procedure, treatment alternatives, risks and benefits explained, specific risks discussed. Consent was given by the patient. Immediately prior to procedure a time out was called to verify the correct patient, procedure, equipment, support staff and site/side marked as required. Patient was prepped and draped in the usual sterile fashion.       Clinical Data: No additional findings.   Subjective: Chief Complaint  Patient presents with  . Right Shoulder  - Follow-up  The patient is a 60 year old female who is now 8 weeks into recovery from right rotator cuff surgery.  She had arthroscopic rotator cuff repair and is doing very well from that.  She does have bilateral knee known osteoarthritis.  She is initially in her right knee replacement but I feel like we need to get her a little bit more out from her repair of the rotator cuff before we replaced her right knee because of pressure needing to put through her shoulders with a walker after the surgery.  I explained this to her in detail.  Her left knee has known arthritis in that knee and she had a hyaluronic acid injection.  That knee started to bother her so she is asking for steroid injection in her left knee today.  She reports good progress with physical therapy on her right shoulder.  HPI  Review of Systems There is currently listed no headache, chest pain, shortness of breath, fever, chills, nausea, vomiting  Objective: Vital Signs: There were no vitals taken for this visit.  Physical Exam She is alert and orient x3 and in no acute distress Ortho Exam Examination of her left knee does show some pain throughout the arc of motion of that knee.  The right knee is the same in terms of medial joint line tenderness and patellofemoral palpitation.  The right shoulder is continuing to move smoothly with abduction  and reaching behind her and external rotation with showing still integrity of the rotator cuff repair. Specialty Comments:  No specialty comments available.  Imaging: No results found.   PMFS History: Patient Active Problem List   Diagnosis Date Noted  . Complete tear of right rotator cuff 08/12/2020  . Hypertensive disorder 05/05/2019   Past Medical History:  Diagnosis Date  . Depression   . GERD (gastroesophageal reflux disease)   . Hypertension     Family History  Problem Relation Age of Onset  . Diabetes Father   . Diabetes Sister   . Diabetes Paternal Aunt      Past Surgical History:  Procedure Laterality Date  . ABDOMINAL HYSTERECTOMY  1985  . CHOLECYSTECTOMY    . SHOULDER ARTHROSCOPY WITH ROTATOR CUFF REPAIR AND SUBACROMIAL DECOMPRESSION Right 08/12/2020   Procedure: RIGHT SHOULDER ARTHROSCOPY WITH ROTATOR CUFF REPAIR AND SUBACROMIAL DECOMPRESSION;  Surgeon: Mcarthur Rossetti, MD;  Location: [place];  Service: Orthopedics;  Laterality: Right;   Social History   Occupational History  . Not on file  Tobacco Use  . Smoking status: Never Smoker  . Smokeless tobacco: Never Used  Substance and Sexual Activity  . Alcohol use: Not Currently  . Drug use: Never  . Sexual activity: Not Currently

## 2020-10-20 ENCOUNTER — Ambulatory Visit (INDEPENDENT_AMBULATORY_CARE_PROVIDER_SITE_OTHER): Payer: Self-pay | Admitting: Physical Therapy

## 2020-10-20 ENCOUNTER — Other Ambulatory Visit: Payer: Self-pay

## 2020-10-20 ENCOUNTER — Encounter: Payer: Self-pay | Admitting: Physical Therapy

## 2020-10-20 DIAGNOSIS — R293 Abnormal posture: Secondary | ICD-10-CM

## 2020-10-20 DIAGNOSIS — M25511 Pain in right shoulder: Secondary | ICD-10-CM

## 2020-10-20 DIAGNOSIS — M6281 Muscle weakness (generalized): Secondary | ICD-10-CM

## 2020-10-20 DIAGNOSIS — R600 Localized edema: Secondary | ICD-10-CM

## 2020-10-20 DIAGNOSIS — M25611 Stiffness of right shoulder, not elsewhere classified: Secondary | ICD-10-CM

## 2020-10-20 NOTE — Therapy (Signed)
[facility] Physical Therapy [address] [place], [place], [address] Phone: [phone]   Fax:  [phone]  Physical Therapy Treatment  Patient Details  Name: Denise Morales MRN: [id number] Date of Birth: [birth year] Referring Provider (PT): Jeronimo Norma, MD   Encounter Date: 10/20/2020   PT End of Session - 10/20/20 1557    Visit Number 13    Number of Visits 32    Date for PT Re-Evaluation 12/13/20    Authorization Type CAFA 3/3 to 02/26/21    Progress Note Due on Visit 10    PT Start Time 1550    PT Stop Time 1628    PT Time Calculation (min) 38 min    Activity Tolerance Patient tolerated treatment well;No increased pain    Behavior During Therapy WFL for tasks assessed/performed           Past Medical History:  Diagnosis Date  . Depression   . GERD (gastroesophageal reflux disease)   . Hypertension     Past Surgical History:  Procedure Laterality Date  . ABDOMINAL HYSTERECTOMY  1985  . CHOLECYSTECTOMY    . SHOULDER ARTHROSCOPY WITH ROTATOR CUFF REPAIR AND SUBACROMIAL DECOMPRESSION Right 08/12/2020   Procedure: RIGHT SHOULDER ARTHROSCOPY WITH ROTATOR CUFF REPAIR AND SUBACROMIAL DECOMPRESSION;  Surgeon: Mcarthur Rossetti, MD;  Location: [place];  Service: Orthopedics;  Laterality: Right;    There were no vitals filed for this visit.   Subjective Assessment - 10/20/20 1556    Subjective Relays her Rt shoulder is doing well, not much pain in it upon arrival.    Patient is accompained by: Family member   [name], husband   Pertinent History HTN    Limitations House hold activities;Lifting    Diagnostic tests MRI 06/14/2020 R shoulder: Full-thickness full width rupture of the supraspinatus tendon  retracted back 1.6 cm. No muscular atrophy or edema.  2. Intrasubstance partial thickness tearing of the infraspinatus  tendon along the myotendinous junction.  3. Mild subscapularis tendinopathy.  4. Mild degenerative AC joint  and glenohumeral joint arthropathy.    Patient Stated Goals be able to use her arm, lift things, dress and groom herself etc.    Pain Onset More than a month ago              [facility] PT Assessment - 10/20/20 0001      Assessment   Medical Diagnosis Rt. Rotator cuff repair    Referring Provider (PT) Christpher [name], MD      AROM   Right Shoulder Flexion 150 Degrees    Right Shoulder ABduction 145 Degrees      Strength   Right Shoulder Flexion 4+/5    Right Shoulder ABduction 4/5    Right Shoulder Internal Rotation 4+/5    Right Shoulder External Rotation 4+/5            [facility] Adult PT Treatment/Exercise - 10/20/20 0001      Shoulder Exercises: Seated   External Rotation AAROM;Both;20 reps    External Rotation Limitations 3# bar    Other Seated Exercises shoulder depression/chair push ups from arm rests 2X10      Shoulder Exercises: Standing   Flexion Both    Shoulder Flexion Weight (lbs) 2    Flexion Limitations 2X10    ABduction Both    Shoulder ABduction Weight (lbs) 1    ABduction Limitations 2X10    Other Standing Exercises OH press with 3# bar 2X10    Other Standing Exercises wall push ups  2x10      Shoulder Exercises: Pulleys   Flexion 2 minutes    ABduction 2 minutes      Shoulder Exercises: ROM/Strengthening   UBE (Upper Arm Bike) L2 3 min fwd, 3 min retro      Manual Therapy   Passive ROM R shoulder PROM all planes to tolerance                    PT Short Term Goals - 09/16/20 1659      PT SHORT TERM GOAL #1   Title Patient will be I with initial progression of HEP    Time 4    Period Weeks    Status Achieved    Target Date 09/21/20      PT SHORT TERM GOAL #2   Title Patient will demonstrate improved Rt shoulder PROM    Baseline see assessment    Time 4    Period Weeks    Status Achieved    Target Date 09/21/20             PT Long Term Goals - 10/12/20 1710      PT LONG TERM GOAL #1   Title Patient will show  improved FOTO score to above 59%    Status On-going      PT LONG TERM GOAL #2   Title Patient will demonstrate improved Rt shoulder strength to be 4+/5 for better ability to function with ADLs    Status On-going      PT LONG TERM GOAL #3   Title Patient will demonstrate AROM that is [facility] for ADLs    Status On-going      PT LONG TERM GOAL #4   Title Patient will demonstrate ability to perform daily tasks without restrictions of motion or pain increasing over 2/10    Status On-going      PT LONG TERM GOAL #5   Title Patient will be I in her long term HEP for continued strengthening and maintenence of movement    Status On-going                 Plan - 10/20/20 1628    Clinical Impression Statement She has made significant progress in her Rt shoulder from last week. We progressed her strength program today with good overall tolerance. We also began closed chain activity as she will eventually have TKA and will need her Rt arm for weight bearing on RW. Had her perform chair push ups from armrests and wall pushups to help prepare for this and this was added into HEP.    Examination-Activity Limitations Bathing;Dressing;Hygiene/Grooming;Carry    Examination-Participation Restrictions Driving;Community Activity;Cleaning;Shop;Meal Prep;Laundry    Rehab Potential Good    PT Frequency 2x / week    PT Duration Other (comment)    PT Treatment/Interventions ADLs/Self Care Home Management;Aquatic Therapy;Taping;Vasopneumatic Device;Dry needling;Passive range of motion;Scar mobilization;Patient/family education;Manual techniques;Neuromuscular re-education;Therapeutic exercise;Therapeutic activities;Functional mobility training;Ultrasound;Moist Heat;Iontophoresis 4mg /ml Dexamethasone;Electrical Stimulation;Cryotherapy;Biofeedback    PT Next Visit Plan progress strength and shoulder ROM as tolerated. We need to get her weight bearing to use RW when she has TKA soon.    PT Home Exercise Plan Access  Code: [email or web address], added water bottle shoulder flexion, abd, OH press, chair push ups from arm rests and wall push ups    Consulted and Agree with Plan of Care Patient           Patient will benefit from skilled therapeutic intervention in order to improve the following  deficits and impairments:  Decreased range of motion,Impaired UE functional use,Pain,Impaired flexibility,Increased edema,Decreased strength,Decreased mobility  Visit Diagnosis: Muscle weakness (generalized)  Acute pain of right shoulder  Stiffness of right shoulder, not elsewhere classified  Abnormal posture  Localized edema     Problem List Patient Active Problem List   Diagnosis Date Noted  . Complete tear of right rotator cuff 08/12/2020  . Hypertensive disorder 05/05/2019    Silvestre Mesi 10/20/2020, 4:31 PM  [facility] Physical Therapy [address] [place], [place], [address] Phone: [phone]   Fax:  [phone]  Name: Kyleigh Nannini MRN: [id number] Date of Birth: [birth year]

## 2020-10-26 ENCOUNTER — Ambulatory Visit (INDEPENDENT_AMBULATORY_CARE_PROVIDER_SITE_OTHER): Payer: Self-pay | Admitting: Physical Therapy

## 2020-10-26 ENCOUNTER — Other Ambulatory Visit: Payer: Self-pay

## 2020-10-26 DIAGNOSIS — M6281 Muscle weakness (generalized): Secondary | ICD-10-CM

## 2020-10-26 DIAGNOSIS — R600 Localized edema: Secondary | ICD-10-CM

## 2020-10-26 DIAGNOSIS — M25511 Pain in right shoulder: Secondary | ICD-10-CM

## 2020-10-26 DIAGNOSIS — M25611 Stiffness of right shoulder, not elsewhere classified: Secondary | ICD-10-CM

## 2020-10-26 DIAGNOSIS — R293 Abnormal posture: Secondary | ICD-10-CM

## 2020-10-26 NOTE — Therapy (Signed)
[facility] Physical Therapy [address] [place], [place], [address] Phone: [phone]   Fax:  [phone]  Physical Therapy Treatment  Patient Details  Name: Denise Morales MRN: [id number] Date of Birth: September 10, 1960 Referring Provider (PT): Jeronimo Norma, MD   Encounter Date: 10/26/2020   PT End of Session - 10/26/20 1627    Visit Number 14    Number of Visits 32    Date for PT Re-Evaluation 12/13/20    Authorization Type CAFA 3/3 to 02/26/21    Progress Note Due on Visit 10    PT Start Time 1550    PT Stop Time 1628    PT Time Calculation (min) 38 min    Activity Tolerance Patient tolerated treatment well;No increased pain    Behavior During Therapy WFL for tasks assessed/performed           Past Medical History:  Diagnosis Date  . Depression   . GERD (gastroesophageal reflux disease)   . Hypertension     Past Surgical History:  Procedure Laterality Date  . ABDOMINAL HYSTERECTOMY  1985  . CHOLECYSTECTOMY    . SHOULDER ARTHROSCOPY WITH ROTATOR CUFF REPAIR AND SUBACROMIAL DECOMPRESSION Right 08/12/2020   Procedure: RIGHT SHOULDER ARTHROSCOPY WITH ROTATOR CUFF REPAIR AND SUBACROMIAL DECOMPRESSION;  Surgeon: Mcarthur Rossetti, MD;  Location: [place];  Service: Orthopedics;  Laterality: Right;    There were no vitals filed for this visit.   Subjective Assessment - 10/26/20 1558    Subjective Relays her Rt shoulder is doing well but she did have a fall on saturday when she tripped but she landed on her Lt arm and not her Rt arm she had the RTC repair on. She relays she is sore but denies any significant injury from the fall.    Patient is accompained by: Family member   [name], husband   Pertinent History HTN    Limitations House hold activities;Lifting    Diagnostic tests MRI 06/14/2020 R shoulder: Full-thickness full width rupture of the supraspinatus tendon  retracted back 1.6 cm. No muscular atrophy or edema.  2.  Intrasubstance partial thickness tearing of the infraspinatus  tendon along the myotendinous junction.  3. Mild subscapularis tendinopathy.  4. 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    Patient Stated Goals be able to use her arm, lift things, dress and groom herself etc.    Pain Onset More than a month ago            [facility] Adult PT Treatment/Exercise - 10/26/20 0001      Shoulder Exercises: Seated   Extension Both;20 reps    Theraband Level (Shoulder Extension) Level 3 (Green)    Row Both;20 reps    Theraband Level (Shoulder Row) Level 3 (Green)    External Rotation Right;20 reps    Theraband Level (Shoulder External Rotation) Level 3 (Green)    Internal Rotation Right;20 reps    Theraband Level (Shoulder Internal Rotation) Level 3 (Green)    Other Seated Exercises D1 and D2 flexion Rt shoulder kX15 ea with 2#      Shoulder Exercises: Standing   Flexion Both    Shoulder Flexion Weight (lbs) 2    Flexion Limitations 2X10    ABduction Both    Shoulder ABduction Weight (lbs) 2    ABduction Limitations 2X10    Other Standing Exercises OH press with 2# DB 2X10 bilat    Other Standing Exercises alt UE weight shifting on table with alt arm flexion  2X10 bilat      Shoulder Exercises: Pulleys   Flexion 2 minutes    ABduction 2 minutes      Shoulder Exercises: ROM/Strengthening   UBE (Upper Arm Bike) L2 3 min fwd, 3 min retro      Shoulder Exercises: Stretch   External Rotation Stretch 10 seconds   10 reps tablestretch                   PT Short Term Goals - 09/16/20 1659      PT SHORT TERM GOAL #1   Title Patient will be I with initial progression of HEP    Time 4    Period Weeks    Status Achieved    Target Date 09/21/20      PT SHORT TERM GOAL #2   Title Patient will demonstrate improved Rt shoulder PROM    Baseline see assessment    Time 4    Period Weeks    Status Achieved    Target Date 09/21/20             PT Long Term  Goals - 10/12/20 1710      PT LONG TERM GOAL #1   Title Patient will show improved FOTO score to above 59%    Status On-going      PT LONG TERM GOAL #2   Title Patient will demonstrate improved Rt shoulder strength to be 4+/5 for better ability to function with ADLs    Status On-going      PT LONG TERM GOAL #3   Title Patient will demonstrate AROM that is [facility] for ADLs    Status On-going      PT LONG TERM GOAL #4   Title Patient will demonstrate ability to perform daily tasks without restrictions of motion or pain increasing over 2/10    Status On-going      PT LONG TERM GOAL #5   Title Patient will be I in her long term HEP for continued strengthening and maintenence of movement    Status On-going                 Plan - 10/26/20 1637    Clinical Impression Statement She was able to progress her strength program some with good tolerance. We will continue to progress this as able.    Examination-Activity Limitations Bathing;Dressing;Hygiene/Grooming;Carry    Examination-Participation Restrictions Driving;Community Activity;Cleaning;Shop;Meal Prep;Laundry    Rehab Potential Good    PT Frequency 2x / week    PT Duration Other (comment)    PT Treatment/Interventions ADLs/Self Care Home Management;Aquatic Therapy;Taping;Vasopneumatic Device;Dry needling;Passive range of motion;Scar mobilization;Patient/family education;Manual techniques;Neuromuscular re-education;Therapeutic exercise;Therapeutic activities;Functional mobility training;Ultrasound;Moist Heat;Iontophoresis 4mg /ml Dexamethasone;Electrical Stimulation;Cryotherapy;Biofeedback    PT Next Visit Plan progress strength and shoulder ROM as tolerated. We need to get her weight bearing to use RW when she has TKA soon.    PT Home Exercise Plan Access Code: [email or web address], added water bottle shoulder flexion, abd, OH press, chair push ups from arm rests and wall push ups    Consulted and Agree with Plan of Care Patient            Patient will benefit from skilled therapeutic intervention in order to improve the following deficits and impairments:  Decreased range of motion,Impaired UE functional use,Pain,Impaired flexibility,Increased edema,Decreased strength,Decreased mobility  Visit Diagnosis: Muscle weakness (generalized)  Acute pain of right shoulder  Stiffness of right shoulder, not elsewhere classified  Abnormal posture  Localized edema  Problem List Patient Active Problem List   Diagnosis Date Noted  . Complete tear of right rotator cuff 08/12/2020  . Hypertensive disorder 05/05/2019    Silvestre Mesi 10/26/2020, 4:39 PM  [facility] Physical Therapy [address] [place], [place], [address] Phone: [phone]   Fax:  [phone]  Name: Pernell Dikes MRN: [id number] Date of Birth: 23-Apr-1961

## 2020-11-03 ENCOUNTER — Ambulatory Visit (INDEPENDENT_AMBULATORY_CARE_PROVIDER_SITE_OTHER): Payer: Self-pay | Admitting: Physical Therapy

## 2020-11-03 ENCOUNTER — Other Ambulatory Visit: Payer: Self-pay

## 2020-11-03 ENCOUNTER — Encounter: Payer: Self-pay | Admitting: Physical Therapy

## 2020-11-03 DIAGNOSIS — M25611 Stiffness of right shoulder, not elsewhere classified: Secondary | ICD-10-CM

## 2020-11-03 DIAGNOSIS — R293 Abnormal posture: Secondary | ICD-10-CM

## 2020-11-03 DIAGNOSIS — M6281 Muscle weakness (generalized): Secondary | ICD-10-CM

## 2020-11-03 DIAGNOSIS — M25511 Pain in right shoulder: Secondary | ICD-10-CM

## 2020-11-03 DIAGNOSIS — R600 Localized edema: Secondary | ICD-10-CM

## 2020-11-03 NOTE — Therapy (Signed)
[facility] Physical Therapy [address] [place], [place], [address] Phone: [phone]   Fax:  [phone]  Physical Therapy Treatment  Patient Details  Name: Denise Morales MRN: [id number] Date of Birth: 11-21-1960 Referring Provider (PT): Jeronimo Norma, MD   Encounter Date: 11/03/2020   PT End of Session - 11/03/20 1618    Visit Number 15    Number of Visits 32    Date for PT Re-Evaluation 12/13/20    Authorization Type CAFA 3/3 to 02/26/21    Progress Note Due on Visit 10    PT Start Time 1555    PT Stop Time 1601    PT Time Calculation (min) 38 min    Activity Tolerance Patient tolerated treatment well;No increased pain    Behavior During Therapy WFL for tasks assessed/performed           Past Medical History:  Diagnosis Date  . Depression   . GERD (gastroesophageal reflux disease)   . Hypertension     Past Surgical History:  Procedure Laterality Date  . ABDOMINAL HYSTERECTOMY  1985  . CHOLECYSTECTOMY    . SHOULDER ARTHROSCOPY WITH ROTATOR CUFF REPAIR AND SUBACROMIAL DECOMPRESSION Right 08/12/2020   Procedure: RIGHT SHOULDER ARTHROSCOPY WITH ROTATOR CUFF REPAIR AND SUBACROMIAL DECOMPRESSION;  Surgeon: Mcarthur Rossetti, MD;  Location: [facility];  Service: Orthopedics;  Laterality: Right;    There were no vitals filed for this visit.   Subjective Assessment - 11/03/20 1611    Subjective Relays she is overall doing well, not much pain but still with some difficulty raising arm out into abduction.    Patient is accompained by: Family member   Denise Morales, husband   Pertinent History HTN    Limitations House hold activities;Lifting    Diagnostic tests MRI 06/14/2020 R shoulder: Full-thickness full width rupture of the supraspinatus tendon  retracted back 1.6 cm. No muscular atrophy or edema.  2. Intrasubstance partial thickness tearing of the infraspinatus  tendon along the myotendinous junction.  3. Mild subscapularis  tendinopathy.  4. 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    Patient Stated Goals be able to use her arm, lift things, dress and groom herself etc.    Pain Onset More than a month ago           [facility] Adult PT Treatment/Exercise - 11/03/20 0001      Shoulder Exercises: Seated   External Rotation Both    Theraband Level (Shoulder External Rotation) Level 3 (Green)    External Rotation Limitations 3X10    Other Seated Exercises D1 and D2 flexion Rt shoulder X15 ea with 2#      Shoulder Exercises: Standing   Shoulder Flexion Weight (lbs) 2    Flexion Limitations 2X10    ABduction Both    Shoulder ABduction Weight (lbs) 2    ABduction Limitations 2X10    Other Standing Exercises OH press with 2# DB 2X10 bilat. Bilat shoulder flexion then moving into abd before lowering 2# 2 sets of 5, then 1# X10.    Other Standing Exercises alt UE weight shifting on table with alt arm flexion X10 bilat, then X10 moving into H abd      Shoulder Exercises: Pulleys   Flexion 2 minutes    ABduction 2 minutes      Shoulder Exercises: ROM/Strengthening   UBE (Upper Arm Bike) L2 3 min fwd, 3 min retro  PT Short Term Goals - 09/16/20 1659      PT SHORT TERM GOAL #1   Title Patient will be I with initial progression of HEP    Time 4    Period Weeks    Status Achieved    Target Date 09/21/20      PT SHORT TERM GOAL #2   Title Patient will demonstrate improved Rt shoulder PROM    Baseline see assessment    Time 4    Period Weeks    Status Achieved    Target Date 09/21/20             PT Long Term Goals - 10/12/20 1710      PT LONG TERM GOAL #1   Title Patient will show improved FOTO score to above 59%    Status On-going      PT LONG TERM GOAL #2   Title Patient will demonstrate improved Rt shoulder strength to be 4+/5 for better ability to function with ADLs    Status On-going      PT LONG TERM GOAL #3   Title Patient will demonstrate  AROM that is [facility] for ADLs    Status On-going      PT LONG TERM GOAL #4   Title Patient will demonstrate ability to perform daily tasks without restrictions of motion or pain increasing over 2/10    Status On-going      PT LONG TERM GOAL #5   Title Patient will be I in her long term HEP for continued strengthening and maintenence of movement    Status On-going                 Plan - 11/03/20 1631    Clinical Impression Statement Again able to progress strength but does still have some limitations with abd and ER strength and we will continue to progress this as able. ROM and pain doing well. She will need MD note next visit.    Examination-Activity Limitations Bathing;Dressing;Hygiene/Grooming;Carry    Examination-Participation Restrictions Driving;Community Activity;Cleaning;Shop;Meal Prep;Laundry    Rehab Potential Good    PT Frequency 2x / week    PT Duration Other (comment)    PT Treatment/Interventions ADLs/Self Care Home Management;Aquatic Therapy;Taping;Vasopneumatic Device;Dry needling;Passive range of motion;Scar mobilization;Patient/family education;Manual techniques;Neuromuscular re-education;Therapeutic exercise;Therapeutic activities;Functional mobility training;Ultrasound;Moist Heat;Iontophoresis 4mg /ml Dexamethasone;Electrical Stimulation;Cryotherapy;Biofeedback    PT Next Visit Plan progress strength and shoulder ROM as tolerated. We need to get her weight bearing to use RW when she has TKA soon.    PT Home Exercise Plan Access Code: [email or web address], added water bottle shoulder flexion, abd, OH press, chair push ups from arm rests and wall push ups    Consulted and Agree with Plan of Care Patient           Patient will benefit from skilled therapeutic intervention in order to improve the following deficits and impairments:  Decreased range of motion,Impaired UE functional use,Pain,Impaired flexibility,Increased edema,Decreased strength,Decreased mobility  Visit  Diagnosis: Muscle weakness (generalized)  Acute pain of right shoulder  Stiffness of right shoulder, not elsewhere classified  Abnormal posture  Localized edema     Problem List Patient Active Problem List   Diagnosis Date Noted  . Complete tear of right rotator cuff 08/12/2020  . Hypertensive disorder 05/05/2019    Silvestre Mesi 11/03/2020, 4:34 PM  [facility] Physical Therapy [address] [place], [place], [address] Phone: [phone]   Fax:  [phone]  Name: Denise Morales MRN: [id number] Date of Birth: July 10, 1960

## 2020-11-08 ENCOUNTER — Telehealth: Payer: Self-pay

## 2020-11-08 ENCOUNTER — Other Ambulatory Visit: Payer: Self-pay | Admitting: Family Medicine

## 2020-11-08 NOTE — Telephone Encounter (Signed)
Copied from [place] [id number]. Topic: Referral - Request for Referral >> Nov 08, 2020  1:17 PM Erick Blinks wrote: Has patient seen PCP for this complaint? No. *If NO, is insurance requiring patient see PCP for this issue before PCP can refer them? Referral for which specialty: Podiatry  Preferred provider/office: Must be [facility]  Reason for referral: Has bunions on her feet  Best contact: [phone]

## 2020-11-10 ENCOUNTER — Other Ambulatory Visit: Payer: Self-pay

## 2020-11-10 ENCOUNTER — Ambulatory Visit (INDEPENDENT_AMBULATORY_CARE_PROVIDER_SITE_OTHER): Payer: Self-pay | Admitting: Physical Therapy

## 2020-11-10 DIAGNOSIS — R600 Localized edema: Secondary | ICD-10-CM

## 2020-11-10 DIAGNOSIS — R293 Abnormal posture: Secondary | ICD-10-CM

## 2020-11-10 DIAGNOSIS — M25511 Pain in right shoulder: Secondary | ICD-10-CM

## 2020-11-10 DIAGNOSIS — M25611 Stiffness of right shoulder, not elsewhere classified: Secondary | ICD-10-CM

## 2020-11-10 DIAGNOSIS — M6281 Muscle weakness (generalized): Secondary | ICD-10-CM

## 2020-11-10 NOTE — Therapy (Addendum)
[facility] Physical Therapy [address] [place], [place], [address] Phone: [phone]   Fax:  [phone]  Physical Therapy Treatment /Discharge  Patient Details  Name: Denise Morales MRN: [id number] Date of Birth: June 05, 1961 Referring Provider (PT): Jeronimo Norma, MD   Encounter Date: 11/10/2020   PT End of Session - 11/10/20 1620     Visit Number 16    Number of Visits 32    Date for PT Re-Evaluation 12/13/20    Authorization Type CAFA 3/3 to 02/26/21    Progress Note Due on Visit 10    PT Start Time 1550    PT Stop Time 1628    PT Time Calculation (min) 38 min    Activity Tolerance Patient tolerated treatment well;No increased pain    Behavior During Therapy WFL for tasks assessed/performed             Past Medical History:  Diagnosis Date   Depression    GERD (gastroesophageal reflux disease)    Hypertension     Past Surgical History:  Procedure Laterality Date   ABDOMINAL HYSTERECTOMY  1985   CHOLECYSTECTOMY     SHOULDER ARTHROSCOPY WITH ROTATOR CUFF REPAIR AND SUBACROMIAL DECOMPRESSION Right 08/12/2020   Procedure: RIGHT SHOULDER ARTHROSCOPY WITH ROTATOR CUFF REPAIR AND SUBACROMIAL DECOMPRESSION;  Surgeon: Mcarthur Rossetti, MD;  Location: [place] [facility];  Service: Orthopedics;  Laterality: Right;    There were no vitals filed for this visit.   Subjective Assessment - 11/10/20 1552     Subjective Relays her shoulder is doing well, her knee pain is her biggest concern and she will see MD about this tomorrow    Patient is accompained by: Family member   Denise Morales, husband   Pertinent History HTN    Limitations House hold activities;Lifting    Diagnostic tests MRI 06/14/2020 R shoulder: Full-thickness full width rupture of the supraspinatus tendon  retracted back 1.6 cm. No muscular atrophy or edema.  2. Intrasubstance partial thickness tearing of the infraspinatus  tendon along the myotendinous junction.  3. Mild  subscapularis tendinopathy.  4. 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    Patient Stated Goals be able to use her arm, lift things, dress and groom herself etc.    Pain Onset More than a month ago                [facility] PT Assessment - 11/10/20 0001       Assessment   Medical Diagnosis Rt. Rotator cuff repair    Referring Provider (PT) Christpher [name], MD      AROM   Right Shoulder Flexion 160 Degrees    Right Shoulder ABduction 145 Degrees    Right Shoulder Internal Rotation --   WNL   Right Shoulder External Rotation --   [facility]     Strength   Right Shoulder Flexion 5/5    Right Shoulder ABduction 4+/5    Right Shoulder Internal Rotation 5/5    Right Shoulder External Rotation 5/5                           [facility] Adult PT Treatment/Exercise - 11/10/20 0001       Shoulder Exercises: Seated   External Rotation Both    Theraband Level (Shoulder External Rotation) Level 3 (Green)    External Rotation Limitations 3X10      Shoulder Exercises: Standing   Flexion Both    Shoulder Flexion Weight (lbs) 2  Flexion Limitations 2X15    ABduction Both    Shoulder ABduction Weight (lbs) 2    ABduction Limitations 3X10    Other Standing Exercises OH press with 2# DB 3X10 bilat. Bilat shoulder flexion then moving into abd before lowering 2# 3X5    Other Standing Exercises alt UE weight shifting on table with alt arm flexion X10 bilat, then X10 moving into H abd      Shoulder Exercises: Pulleys   Flexion 2 minutes    ABduction 2 minutes      Shoulder Exercises: ROM/Strengthening   UBE (Upper Arm Bike) L3 3 min fwd, 3 min retro                      PT Short Term Goals - 09/16/20 1659       PT SHORT TERM GOAL #1   Title Patient will be I with initial progression of HEP    Time 4    Period Weeks    Status Achieved    Target Date 09/21/20      PT SHORT TERM GOAL #2   Title Patient will demonstrate improved Rt  shoulder PROM    Baseline see assessment    Time 4    Period Weeks    Status Achieved    Target Date 09/21/20               PT Long Term Goals - 11/10/20 1625       PT LONG TERM GOAL #1   Title Patient will show improved FOTO score to above 59%    Status On-going      PT LONG TERM GOAL #2   Title Patient will demonstrate improved Rt shoulder strength to be 4+/5 for better ability to function with ADLs    Status Achieved      PT LONG TERM GOAL #3   Title Patient will demonstrate AROM that is [name] for ADLs    Status Achieved      PT LONG TERM GOAL #4   Title Patient will demonstrate ability to perform daily tasks without restrictions of motion or pain increasing over 2/10    Status Achieved      PT LONG TERM GOAL #5   Title Patient will be I in her long term HEP for continued strengthening and maintenence of movement    Status Achieved                   Plan - 11/10/20 1622     Clinical Impression Statement She has made excellent progress with her PT. PROM now WNL and her AROM and strength are WFL with just mild limitations in abduction strength. She has 2 more PT visits left and likely will be ready for discharge unless she has any set backs. We have worked on closed chain strengthening as well with chair push ups and wall push ups and Upper extremity weight shifts so simulate ambulating with RW which she will need to do after upcoming TKA and she has done well with this too.    Examination-Activity Limitations Bathing;Dressing;Hygiene/Grooming;Carry    Examination-Participation Restrictions Driving;Community Activity;Cleaning;Shop;Meal Prep;Laundry    Rehab Potential Good    PT Frequency 2x / week    PT Duration Other (comment)    PT Treatment/Interventions ADLs/Self Care Home Management;Aquatic Therapy;Taping;Vasopneumatic Device;Dry needling;Passive range of motion;Scar mobilization;Patient/family education;Manual techniques;Neuromuscular  re-education;Therapeutic exercise;Therapeutic activities;Functional mobility training;Ultrasound;Moist Heat;Iontophoresis 4mg /ml Dexamethasone;Electrical Stimulation;Cryotherapy;Biofeedback    PT Next Visit Plan progress strength  and shoulder ROM as tolerated. We need to get her weight bearing to use RW when she has TKA soon.    PT Home Exercise Plan Access Code: [email or web address], added water bottle shoulder flexion, abd, OH press, chair push ups from arm rests and wall push ups    Consulted and Agree with Plan of Care Patient             Patient will benefit from skilled therapeutic intervention in order to improve the following deficits and impairments:  Decreased range of motion,Impaired UE functional use,Pain,Impaired flexibility,Increased edema,Decreased strength,Decreased mobility  Visit Diagnosis: Muscle weakness (generalized)  Acute pain of right shoulder  Stiffness of right shoulder, not elsewhere classified  Abnormal posture  Localized edema     Problem List Patient Active Problem List   Diagnosis Date Noted   Complete tear of right rotator cuff 08/12/2020   Hypertensive disorder 05/05/2019    Silvestre Mesi 11/10/2020, 4:26 PM  PHYSICAL THERAPY DISCHARGE SUMMARY  Visits from Start of Care: 16  Current functional level related to goals / functional outcomes: See note   Remaining deficits: See note   Education / Equipment: HEP   Patient agrees to discharge. Patient goals were partially met. Patient is being discharged due to not returning since the last visit.  Scot Jun, PT, DPT, OCS, ATC 12/13/20  10:24 AM     [facility] Physical Therapy [address] [place], [place], [address] Phone: [phone]   Fax:  [phone]  Name: Denise Morales MRN: [id number] Date of Birth: 01/28/1961

## 2020-11-11 ENCOUNTER — Ambulatory Visit (INDEPENDENT_AMBULATORY_CARE_PROVIDER_SITE_OTHER): Payer: Medicaid Other | Admitting: Orthopaedic Surgery

## 2020-11-11 DIAGNOSIS — M1712 Unilateral primary osteoarthritis, left knee: Secondary | ICD-10-CM | POA: Insufficient documentation

## 2020-11-11 DIAGNOSIS — M1711 Unilateral primary osteoarthritis, right knee: Secondary | ICD-10-CM | POA: Diagnosis not present

## 2020-11-11 DIAGNOSIS — Z9889 Other specified postprocedural states: Secondary | ICD-10-CM

## 2020-11-11 DIAGNOSIS — M170 Bilateral primary osteoarthritis of knee: Secondary | ICD-10-CM

## 2020-11-11 NOTE — Progress Notes (Signed)
And today 12 weeks out from a right shoulder arthroscopy with subacromial decompression and debridement.  She is doing very well with the shoulder.  She does ambit with a cane and is in need of a scooter.  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  At this point she is now ready to proceed with a right total knee arthroplasty since she will be able to get around the walker with using her shoulders as she rehabilitates her right knee.  We have already talked her in detail about knee replacement surgery.  She has tried and failed conservative treatment over 12 months for both knees including steroid injections and hyaluronic acid injections.  MRIs of both knees show severe arthritis of patellofemoral joint and the medial compartment.  She is otherwise had no acute change in medical status.  I was able to review her epic charts.  She currently denies any headache, chest pain, shortness of breath, fever, chills, nausea, vomiting.  We talked in detail about the risk and benefits of knee replacement surgery.  We talked about the interoperative and postoperative course and what to expect.  Both knees have varus malalignment and significant patellofemoral crepitation as well as medial joint line tenderness.  Both knees have excellent range of motion.  We will work on getting her right knee scheduled for a total knee arthroplasty.  All questions and concerns were answered and addressed.

## 2020-11-17 ENCOUNTER — Encounter: Payer: Medicaid Other | Admitting: Physical Therapy

## 2020-11-17 ENCOUNTER — Other Ambulatory Visit: Payer: Self-pay

## 2020-11-24 ENCOUNTER — Other Ambulatory Visit: Payer: Self-pay | Admitting: Orthopaedic Surgery

## 2020-11-24 ENCOUNTER — Telehealth: Payer: Self-pay

## 2020-11-24 ENCOUNTER — Encounter: Payer: Medicaid Other | Admitting: Physical Therapy

## 2020-11-24 MED ORDER — TRAMADOL HCL 50 MG PO TABS: 50.0000 mg | ORAL_TABLET | Freq: Four times a day (QID) | ORAL | 0 refills | Status: DC | PRN

## 2020-11-24 NOTE — Telephone Encounter (Signed)
Patient called she is requesting a rx refill fir tramadol until she has her surgery call [phone]

## 2020-12-01 ENCOUNTER — Other Ambulatory Visit: Payer: Self-pay

## 2020-12-01 ENCOUNTER — Ambulatory Visit (INDEPENDENT_AMBULATORY_CARE_PROVIDER_SITE_OTHER): Payer: Medicaid Other | Admitting: Primary Care

## 2020-12-01 ENCOUNTER — Encounter (INDEPENDENT_AMBULATORY_CARE_PROVIDER_SITE_OTHER): Payer: Self-pay | Admitting: Primary Care

## 2020-12-01 VITALS — BP 133/88 | HR 90 | Temp 97.3°F | Ht 59.0 in | Wt 175.4 lb

## 2020-12-01 DIAGNOSIS — R7303 Prediabetes: Secondary | ICD-10-CM

## 2020-12-01 DIAGNOSIS — Z760 Encounter for issue of repeat prescription: Secondary | ICD-10-CM

## 2020-12-01 DIAGNOSIS — M21611 Bunion of right foot: Secondary | ICD-10-CM | POA: Diagnosis not present

## 2020-12-01 DIAGNOSIS — Z1231 Encounter for screening mammogram for malignant neoplasm of breast: Secondary | ICD-10-CM

## 2020-12-01 DIAGNOSIS — I10 Essential (primary) hypertension: Secondary | ICD-10-CM

## 2020-12-01 DIAGNOSIS — E782 Mixed hyperlipidemia: Secondary | ICD-10-CM

## 2020-12-01 DIAGNOSIS — Z1211 Encounter for screening for malignant neoplasm of colon: Secondary | ICD-10-CM

## 2020-12-01 DIAGNOSIS — M21612 Bunion of left foot: Secondary | ICD-10-CM | POA: Diagnosis not present

## 2020-12-01 LAB — POCT GLYCOSYLATED HEMOGLOBIN (HGB A1C): Hemoglobin A1C: 5.8 % — AB (ref 4.0–5.6)

## 2020-12-01 MED ORDER — ESCITALOPRAM OXALATE 20 MG PO TABS: 20.0000 mg | ORAL_TABLET | Freq: Every day | ORAL | 1 refills | Status: DC

## 2020-12-01 MED ORDER — PRAVASTATIN SODIUM 40 MG PO TABS: 40.0000 mg | ORAL_TABLET | Freq: Every day | ORAL | 1 refills | Status: DC

## 2020-12-01 MED ORDER — LISINOPRIL-HYDROCHLOROTHIAZIDE 10-12.5 MG PO TABS: 1.0000 | ORAL_TABLET | Freq: Every day | ORAL | 1 refills | Status: DC

## 2020-12-01 NOTE — Patient Instructions (Signed)
Bunion A bunion (hallux valgus) is a bump that forms slowly on the inner side of the big toe joint. It occurs when the big toe turns toward the second toe. Bunions may be small at first, but they often get larger over time. They can make walking painful. What are the causes? This condition may be caused by:  Wearing narrow or pointed shoes that force the big toe to press against the other toes.  Abnormal foot development that causes the foot to roll inward.  Changes in the foot that are caused by certain diseases, such as rheumatoid arthritis or polio.  A foot injury. What increases the risk? The following factors may make you more likely to develop this condition:  Wearing shoes that squeeze the toes together.  Having certain diseases, such as: ? Rheumatoid arthritis. ? Polio. ? Cerebral palsy.  Having family members who have bunions.  Being born with abnormally shaped feet (a foot deformity), such as flat feet or low arches.  Doing activities that put a lot of pressure on the feet, such as ballet dancing. What are the signs or symptoms? The main symptom of this condition is a bump on your big toe that you can notice. Other symptoms may include:  Pain.  Redness and inflammation around your big toe.  Thick or hardened skin on your big toe or between your toes.  Stiffness or loss of motion in your big toe.  Trouble with walking.   How is this diagnosed? This condition may be diagnosed based on your symptoms, medical history, and activities. You may also have tests and imaging, such as:  X-rays. These allow your health care provider to check the position of the bones in your foot and look for damage to your joint. They also help your health care provider determine the severity of your bunion and the best way to treat it.  Joint aspiration. In this test, a sample of fluid is removed from the toe joint. This test may be done if you are in a lot of pain. It helps rule out  diseases that cause painful swelling of the joints, such as arthritis or gout. How is this treated? Treatment depends on the severity of your symptoms. The goal of treatment is to relieve symptoms and prevent your bunion from getting worse. Your health care provider may recommend:  Wearing shoes that have a wide toe box, or using bunion pads to cushion the affected area.  Taping your toes together to keep them in a normal position.  Placing a device inside your shoe (orthotic device) to help reduce pressure on your toe joint.  Taking medicine to ease pain and inflammation.  Putting ice or heat on the affected area.  Doing stretching exercises.  Surgery, for severe cases. Follow these instructions at home: Managing pain, stiffness, and swelling  If directed, put ice on the painful area. To do this: ? Put ice in a plastic bag. ? Place a towel between your skin and the bag. ? Leave the ice on for 20 minutes, 2-3 times a day. ? Remove the ice if your skin turns bright red. This is very important. If you cannot feel pain, heat, or cold, you have a greater risk of damage to the area.  If directed, apply heat to the affected area before you exercise. Use the heat source that your health care provider recommends, such as a moist heat pack or a heating pad. ? Place a towel between your skin and the   heat source. ? Leave the heat on for 20-30 minutes. ? Remove the heat if your skin turns bright red. This is especially important if you are unable to feel pain, heat, or cold. You have a greater risk of getting burned.      General instructions  Do exercises as told by your health care provider.  Support your toe joint with proper footwear, shoe padding, or taping as told by your health care provider.  Take over-the-counter and prescription medicines only as told by your health care provider.  Do not use any products that contain nicotine or tobacco, such as cigarettes, e-cigarettes, and  chewing tobacco. If you need help quitting, ask your health care provider.  Keep all follow-up visits. This is important. Contact a health care provider if:  Your symptoms get worse.  Your symptoms do not improve in 2 weeks. Get help right away if:  You have severe pain and trouble with walking. Summary  A bunion is a bump on the inner side of the big toe joint that forms when the big toe turns toward the second toe.  Bunions can make walking painful.  Treatment depends on the severity of your symptoms.  Support your toe joint with proper footwear, shoe padding, or taping as told by your health care provider. This information is not intended to replace advice given to you by your health care provider. Make sure you discuss any questions you have with your health care provider. Document Revised: 10/17/2019 Document Reviewed: 10/17/2019 Elsevier Patient Education  2021 [employer]  

## 2020-12-01 NOTE — Progress Notes (Signed)
Established Patient Office Visit  Subjective:  Patient ID: Denise Morales, female    DOB: 06-11-1961  Age: 60 y.o. MRN: [id number]  CC:  Chief Complaint  [name] with   Bunions    HPI Denise Morales for bilateral feet pain, back pain and Shoulder pain- rotator cuff repair (right)   Past Medical History:  Diagnosis Date   Depression    GERD (gastroesophageal reflux disease)    Hypertension     Past Surgical History:  Procedure Laterality Date   ABDOMINAL HYSTERECTOMY  1985   CHOLECYSTECTOMY     SHOULDER ARTHROSCOPY WITH ROTATOR CUFF REPAIR AND SUBACROMIAL DECOMPRESSION Right 08/12/2020   Procedure: RIGHT SHOULDER ARTHROSCOPY WITH ROTATOR CUFF REPAIR AND SUBACROMIAL DECOMPRESSION;  Surgeon: Mcarthur Rossetti, MD;  Location: [facility];  Service: Orthopedics;  Laterality: Right;    Family History  Problem Relation Age of Onset   Diabetes Father    Diabetes Sister    Diabetes Paternal Aunt     Social History   Socioeconomic History   Marital status: Single    Spouse name: Not on file   Number of children: Not on file   Years of education: Not on file   Highest education level: Not on file  Occupational History   Not on file  Tobacco Use   Smoking status: Never   Smokeless tobacco: Never  Substance and Sexual Activity   Alcohol use: Not Currently   Drug use: Never   Sexual activity: Not Currently  Other Topics Concern   Not on file  Social History Narrative   Not on file   Social Determinants of Health   Financial Resource Strain: Not on file  Food Insecurity: Not on file  Transportation Needs: Not on file  Physical Activity: Not on file  Stress: Not on file  Social Connections: Not on file  Intimate Partner Violence: Not on file    Outpatient Medications Prior to Visit  Medication Sig Dispense Refill   acetaminophen (TYLENOL) 500 MG tablet Take 1,000 mg by mouth every 6 (six) hours as needed.     baclofen  (LIORESAL) 10 MG tablet TAKE 1/2 TO 1 (ONE-HALF TO ONE) TABLET BY MOUTH THREE TIMES DAILY AS NEEDED FOR MUSCLE SPASMS 90 tablet 3   Cholecalciferol (VITAMIN D-3) 125 MCG (5000 UT) TABS Take 1 tablet by mouth daily. 90 tablet 3   Menatetrenone (VITAMIN K2) 100 MCG TABS Take 100 mcg by mouth daily. 90 tablet 3   nabumetone (RELAFEN) 500 MG tablet Take 1 tablet by mouth twice daily as needed 60 tablet 0   omeprazole (PRILOSEC) 40 MG capsule Take 1 capsule (40 mg total) by mouth daily. 90 capsule 1   tiZANidine (ZANAFLEX) 4 MG tablet Take 1 tablet (4 mg total) by mouth every 8 (eight) hours as needed for up to 12 doses for muscle spasms. 12 tablet 0   traMADol (ULTRAM) 50 MG tablet Take 1 tablet (50 mg total) by mouth every 6 (six) hours as needed. 30 tablet 0   escitalopram (LEXAPRO) 20 MG tablet Take 1 tablet (20 mg total) by mouth daily. 90 tablet 1   lisinopril-hydrochlorothiazide (ZESTORETIC) 10-12.5 MG tablet Take 1 tablet by mouth once daily 90 tablet 0   pravastatin (PRAVACHOL) 40 MG tablet Take 1 tablet (40 mg total) by mouth daily. 90 tablet 3   oxyCODONE (ROXICODONE) 5 MG immediate release tablet Take 1 tablet (5 mg total) by mouth every 4 (four) hours as needed for severe  pain. 30 tablet 0   oxyCODONE-acetaminophen (PERCOCET/ROXICET) 5-325 MG tablet Take 1 tablet by mouth every 6 (six) hours as needed for severe pain. 6 tablet 0   No facility-administered medications prior to visit.    No Known Allergies  ROS Review of Systems  Musculoskeletal:  Positive for back pain.       Shoulder pain- rotator cuff repair   All other systems reviewed and are negative.    Objective:    Physical Exam Vitals reviewed.  Constitutional:      Appearance: She is obese.  HENT:     Head: Normocephalic.     Right Ear: Tympanic membrane and external ear normal.     Left Ear: There is impacted cerumen.     Nose: Nose normal.  Eyes:     Extraocular Movements: Extraocular movements intact.      Pupils: Pupils are equal, round, and reactive to light.  Cardiovascular:     Rate and Rhythm: Normal rate and regular rhythm.  Pulmonary:     Effort: Pulmonary effort is normal.     Breath sounds: Normal breath sounds.  Abdominal:     General: Bowel sounds are normal. There is distension.     Palpations: Abdomen is soft.  Musculoskeletal:        General: Normal range of motion.     Cervical back: Normal range of motion and neck supple.  Skin:    General: Skin is warm and dry.  Neurological:     Mental Status: She is alert and oriented to person, place, and time.  Psychiatric:        Mood and Affect: Mood normal.        Behavior: Behavior normal.        Thought Content: Thought content normal.        Judgment: Judgment normal.    BP 133/88 (BP Location: Right Arm, Patient Position: Sitting, Cuff Size: Normal)   Pulse 90   Temp (!) 97.3 F (36.3 C) (Temporal)   Ht 4\' 11"  (1.499 m)   Wt 175 lb 6.4 oz (79.6 kg)   SpO2 93%   BMI 35.43 kg/m  Wt Readings from Last 3 Encounters:  12/01/20 175 lb 6.4 oz (79.6 kg)  08/12/20 171 lb 15.3 oz (78 kg)  03/30/20 166 lb 9.6 oz (75.6 kg)     Health Maintenance Due  Topic Date Due   HIV Screening  Never done   COLONOSCOPY (Pts 45-42yrs Insurance coverage will need to be confirmed)  Never done   MAMMOGRAM  Never done   Zoster Vaccines- Shingrix (1 of 2) Never done   COVID-19 Vaccine (2 - Booster for Janssen series) 11/02/2019    There are no preventive care reminders to display for this patient.  Lab Results  Component Value Date   TSH 3.110 01/01/2020   Lab Results  Component Value Date   WBC 9.0 01/01/2020   HGB 14.0 01/01/2020   HCT 40.5 01/01/2020   MCV 84 01/01/2020   PLT 369 01/01/2020   Lab Results  Component Value Date   NA 142 08/09/2020   K 3.6 08/09/2020   CO2 24 08/09/2020   GLUCOSE 98 08/09/2020   BUN 23 08/09/2020   CREATININE 1.16 (H) 08/09/2020   BILITOT 0.3 01/01/2020   ALKPHOS 99 01/01/2020   AST  16 01/01/2020   ALT 13 01/01/2020   PROT 6.9 01/01/2020   ALBUMIN 4.6 01/01/2020   CALCIUM 9.1 08/09/2020   ANIONGAP 11 08/09/2020  Lab Results  Component Value Date   CHOL 157 03/30/2020   Lab Results  Component Value Date   HDL 47 03/30/2020   Lab Results  Component Value Date   LDLCALC 77 03/30/2020   Lab Results  Component Value Date   TRIG 194 (H) 03/30/2020   Lab Results  Component Value Date   CHOLHDL 3.3 03/30/2020   Lab Results  Component Value Date   HGBA1C 5.8 (A) 12/01/2020      Assessment & Plan:  Kaena was seen today for bunions.  Diagnoses and all orders for this visit:  Encounter for screening mammogram for malignant neoplasm of breast -     MM Digital Screening; Future  Colon cancer screening -     Ambulatory referral to Gastroenterology  Bilateral bunions -     Ambulatory referral to Podiatry  Essential hypertension We have discussed target BP range and blood pressure goal. I have advised patient to check BP regularly and to call us back or report to clinic if the numbers are consistently higher than 140/90. We discussed the importance of compliance with medical therapy and DASH diet recommended, consequences of uncontrolled hypertension discussed.  - continue current BP medications  -     lisinopril-hydrochlorothiazide (ZESTORETIC) 10-12.5 MG tablet; Take 1 tablet by mouth daily.  Mixed hyperlipidemia  Decrease your fatty foods, red meat, cheese, milk and increase fiber like whole grains and veggies. You can also add a fiber supplement like Metamucil or Benefiber.   -     pravastatin (PRAVACHOL) 40 MG tablet; Take 1 tablet (40 mg total) by mouth daily.  Medication refill -     lisinopril-hydrochlorothiazide (ZESTORETIC) 10-12.5 MG tablet; Take 1 tablet by mouth daily. -     pravastatin (PRAVACHOL) 40 MG tablet; Take 1 tablet (40 mg total) by mouth daily. -     escitalopram (LEXAPRO) 20 MG tablet; Take 1 tablet (20 mg total) by mouth  daily.  Prediabetes -     HgB A1c 5.8 Recommendations For Diabetic/Prediabetic Patients:   -  Take medications as prescribed  - AVOID Animal products, ie. Meat - red/white, Poultry and Dairy/especially cheese - Exercise at least 5 times a week for 30 minutes or preferably daily.  - No Smoking - Drink less than 2 drinks a day.  - Monitor your feet for sores - Have yearly Eye Exams - Recommend annual Flu vaccine  - Recommend Pneumovax and Prevnar vaccines - Shingles Vaccine (Zostavax) if over 71 y.o.  Goals:   - BMI less than 24 - Fasting sugar less than 130 or less than 150 if tapering medicines to lose weight  - Systolic BP less than 836  - Diastolic BP less than 80 - Bad LDL Cholesterol less than 70 - Triglycerides less than 150      Meds ordered this encounter  Medications   lisinopril-hydrochlorothiazide (ZESTORETIC) 10-12.5 MG tablet    Sig: Take 1 tablet by mouth daily.    Dispense:  90 tablet    Refill:  1   pravastatin (PRAVACHOL) 40 MG tablet    Sig: Take 1 tablet (40 mg total) by mouth daily.    Dispense:  90 tablet    Refill:  1   escitalopram (LEXAPRO) 20 MG tablet    Sig: Take 1 tablet (20 mg total) by mouth daily.    Dispense:  90 tablet    Refill:  1    Follow-up: Return in about 6 months (around 06/02/2021) for Bp/fasting labs.  Kerin Perna, NP

## 2020-12-03 ENCOUNTER — Other Ambulatory Visit: Payer: Self-pay

## 2020-12-06 ENCOUNTER — Other Ambulatory Visit: Payer: Self-pay | Admitting: Family Medicine

## 2020-12-09 ENCOUNTER — Ambulatory Visit (INDEPENDENT_AMBULATORY_CARE_PROVIDER_SITE_OTHER): Payer: Medicaid Other

## 2020-12-09 ENCOUNTER — Ambulatory Visit (INDEPENDENT_AMBULATORY_CARE_PROVIDER_SITE_OTHER): Payer: Medicaid Other | Admitting: Podiatry

## 2020-12-09 ENCOUNTER — Other Ambulatory Visit: Payer: Self-pay

## 2020-12-09 ENCOUNTER — Encounter: Payer: Self-pay | Admitting: Podiatry

## 2020-12-09 DIAGNOSIS — M21611 Bunion of right foot: Secondary | ICD-10-CM

## 2020-12-09 DIAGNOSIS — M21612 Bunion of left foot: Secondary | ICD-10-CM | POA: Diagnosis not present

## 2020-12-09 DIAGNOSIS — M7671 Peroneal tendinitis, right leg: Secondary | ICD-10-CM | POA: Diagnosis not present

## 2020-12-09 DIAGNOSIS — M778 Other enthesopathies, not elsewhere classified: Secondary | ICD-10-CM | POA: Diagnosis not present

## 2020-12-09 DIAGNOSIS — M7752 Other enthesopathy of left foot: Secondary | ICD-10-CM

## 2020-12-09 MED ORDER — TRIAMCINOLONE ACETONIDE 10 MG/ML IJ SUSP
20.0000 mg | Freq: Once | INTRAMUSCULAR | Status: AC
  Administered 2020-12-09: 20 mg

## 2020-12-09 NOTE — Progress Notes (Signed)
Subjective:   Patient ID: Denise Morales, female   DOB: 60 y.o.   MRN: [id number]   HPI Patient presents with significant bunion deformity bilateral that are not significantly tender with exquisite discomfort on the dorsum of the left foot and around the big toe joint of the right foot.  Patient states that this is been ongoing and she has significant foot structural loss with also pain in the outside of the right foot.  Patient does not smoke presents with caregiver and tries to be active   Review of Systems  All other systems reviewed and are negative.      Objective:  Physical Exam Vitals and nursing note reviewed.  Constitutional:      Appearance: She is well-developed.  Pulmonary:     Effort: Pulmonary effort is normal.  Musculoskeletal:        General: Normal range of motion.  Skin:    General: Skin is warm.  Neurological:     Mental Status: She is alert.    Neurovascular status was found to be intact muscle strength was found to be adequate range of motion adequate with patient found to have severe bunion deformity bilateral extensive arthritis midfoot left and inflammation pain of the outside of the right foot around the peroneal insertion and also discomfort around the big toe joint bilateral.  Patient is found to have good digit perfusion well oriented x3 moderate depression of the arch     Assessment:  Severe structural deformity with bunion deformity bilateral extensor tendinitis left peroneal tendinitis right     Plan:  H&P x-rays reviewed all conditions discussed and due to severity of deformity and shoulder and knee issues I do not recommend bunion correction currently.  I did inject the extensor complex left 3 mg Kenalog 5 mg Xylocaine in the peroneal base right 3 mg Kenalog 5 mg Xylocaine advised on supportive shoes and patient will be seen back as needed  X-rays indicate severe structural malalignment of both feet with arthritis and bunion deformity bilateral

## 2020-12-10 ENCOUNTER — Other Ambulatory Visit: Payer: Self-pay | Admitting: Family Medicine

## 2020-12-13 ENCOUNTER — Other Ambulatory Visit: Payer: Self-pay | Admitting: Physician Assistant

## 2020-12-14 NOTE — Patient Instructions (Addendum)
DUE TO COVID-19 ONLY ONE VISITOR IS ALLOWED TO COME WITH YOU AND STAY IN THE WAITING ROOM ONLY DURING PRE OP AND PROCEDURE DAY OF SURGERY. THE 2 VISITORS  MAY VISIT WITH YOU AFTER SURGERY IN YOUR PRIVATE ROOM DURING VISITING HOURS ONLY!  YOU NEED TO HAVE A COVID 19 TEST ON__6/29_____ @__9 :45_____, THIS TEST MUST BE DONE BEFORE SURGERY,  COVID TESTING SITE [place] [address], IT IS ON THE RIGHT GOING OUT [address] APPROXIMATELY  2 MINUTES PAST [employer] ON THE RIGHT. ONCE YOUR COVID TEST IS COMPLETED,  PLEASE BEGIN THE QUARANTINE INSTRUCTIONS AS OUTLINED IN YOUR HANDOUT.                Denise Morales     Your procedure is scheduled on: 12/24/20   Report to [facility] Main  Entrance   Report to Short stay at 5:15 AM     Call this number if you have problems the morning of surgery [place], NO [place].   No food after midnight.    You may have clear liquid until 4:30 AM.    At 4:00 AM drink pre surgery drink.   Nothing by mouth after 4:30 AM.    Take these medicines the morning of surgery with A SIP OF WATER: Lexapro, Omeprazole                                 You may not have any metal on your body including hair pins and              piercings  Do not wear jewelry, make-up, lotions, powders or perfumes, deodorant             Do not wear nail polish on your fingernails.  Do not shave  48 hours prior to surgery.               Do not bring valuables to the hospital. [place].  Contacts, dentures or bridgework may not be worn into surgery.        Special Instructions: N/A              Please read over the following fact sheets you were given: _____________________________________________________________________             [facility] - Preparing for Surgery Before surgery, you can play  an important role.  Because skin is not sterile, your skin needs to be as free of germs as possible.  You can reduce the number of germs on your skin by washing with CHG (chlorahexidine gluconate) soap before surgery.  CHG is an antiseptic cleaner which kills germs and bonds with the skin to continue killing germs even after washing. Please DO NOT use if you have an allergy to CHG or antibacterial soaps.  If your skin becomes reddened/irritated stop using the CHG and inform your nurse when you arrive at Short Stay. Do not shave (including legs and underarms) for at least 48 hours prior to the first CHG shower.    Please follow these instructions carefully:  1.  Shower with CHG Soap the night before surgery and the  morning of Surgery.  2.  If you  choose to wash your hair, wash your hair first as usual with your  normal  shampoo.  3.  After you shampoo, rinse your hair and body thoroughly to remove the  shampoo.                                        4.  Use CHG as you would any other liquid soap.  You can apply chg directly  to the skin and wash                       Gently with a scrungie or clean washcloth.  5.  Apply the CHG Soap to your body ONLY FROM THE NECK DOWN.   Do not use on face/ open                           Wound or open sores. Avoid contact with eyes, ears mouth and genitals (private parts).                       Wash face,  Genitals (private parts) with your normal soap.             6.  Wash thoroughly, paying special attention to the area where your surgery  will be performed.  7.  Thoroughly rinse your body with warm water from the neck down.  8.  DO NOT shower/wash with your normal soap after using and rinsing off  the CHG Soap.             9.  Pat yourself dry with a clean towel.            10.  Wear clean pajamas.            11.  Place clean sheets on your bed the night of your first shower and do not  sleep with pets. Day of Surgery : Do not apply any lotions/deodorants the  morning of surgery.  Please wear clean clothes to the hospital/surgery center.  FAILURE TO FOLLOW THESE INSTRUCTIONS MAY RESULT IN THE CANCELLATION OF YOUR SURGERY PATIENT SIGNATURE_________________________________  NURSE SIGNATURE__________________________________  ________________________________________________________________________   Denise Morales  An incentive spirometer is a tool that can help keep your lungs clear and active. This tool measures how well you are filling your lungs with each breath. Taking long deep breaths may help reverse or decrease the chance of developing breathing (pulmonary) problems (especially infection) following: A long period of time when you are unable to move or be active. BEFORE THE PROCEDURE  If the spirometer includes an indicator to show your best effort, your nurse or respiratory therapist will set it to a desired goal. If possible, sit up straight or lean slightly forward. Try not to slouch. Hold the incentive spirometer in an upright position. INSTRUCTIONS FOR USE  Sit on the edge of your bed if possible, or sit up as far as you can in bed or on a chair. Hold the incentive spirometer in an upright position. Breathe out normally. Place the mouthpiece in your mouth and seal your lips tightly around it. Breathe in slowly and as deeply as possible, raising the piston or the ball toward the top of the column. Hold your breath for 3-5 seconds or for as long as possible. Allow the piston or ball to fall to  the bottom of the column. Remove the mouthpiece from your mouth and breathe out normally. Rest for a few seconds and repeat Steps 1 through 7 at least 10 times every 1-2 hours when you are awake. Take your time and take a few normal breaths between deep breaths. The spirometer may include an indicator to show your best effort. Use the indicator as a goal to work toward during each repetition. After each set of 10 deep breaths, practice  coughing to be sure your lungs are clear. If you have an incision (the cut made at the time of surgery), support your incision when coughing by placing a pillow or rolled up towels firmly against it. Once you are able to get out of bed, walk around indoors and cough well. You may stop using the incentive spirometer when instructed by your caregiver.  RISKS AND COMPLICATIONS Take your time so you do not get dizzy or light-headed. If you are in pain, you may need to take or ask for pain medication before doing incentive spirometry. It is harder to take a deep breath if you are having pain. AFTER USE Rest and breathe slowly and easily. It can be helpful to keep track of a log of your progress. Your caregiver can provide you with a simple table to help with this. If you are using the spirometer at home, follow these instructions: [place] IF:  You are having difficultly using the spirometer. You have trouble using the spirometer as often as instructed. Your pain medication is not giving enough relief while using the spirometer. You develop fever of 100.5 F (38.1 C) or higher. SEEK IMMEDIATE MEDICAL CARE IF:  You cough up bloody sputum that had not been present before. You develop fever of 102 F (38.9 C) or greater. You develop worsening pain at or near the incision site. MAKE SURE YOU:  Understand these instructions. Will watch your condition. Will get help right away if you are not doing well or get worse. Document Released: 10/23/2006 Document Revised: 09/04/2011 Document Reviewed: 12/24/2006 [facility] Patient Information 2014 [place], [place].   ________________________________________________________________________

## 2020-12-16 ENCOUNTER — Other Ambulatory Visit: Payer: Self-pay

## 2020-12-16 ENCOUNTER — Encounter (HOSPITAL_COMMUNITY)
Admission: RE | Admit: 2020-12-16 | Discharge: 2020-12-16 | Disposition: A | Discharge status: Home / Self Care | Payer: Medicaid Other | Source: Ambulatory Visit | Attending: Orthopaedic Surgery | Admitting: Orthopaedic Surgery

## 2020-12-16 ENCOUNTER — Encounter (HOSPITAL_COMMUNITY): Payer: Self-pay

## 2020-12-16 DIAGNOSIS — Z01812 Encounter for preprocedural laboratory examination: Secondary | ICD-10-CM | POA: Insufficient documentation

## 2020-12-16 LAB — SURGICAL PCR SCREEN
MRSA, PCR: NEGATIVE
Staphylococcus aureus: NEGATIVE

## 2020-12-16 LAB — CBC
HCT: 41.5 % (ref 36.0–46.0)
Hemoglobin: 13.3 g/dL (ref 12.0–15.0)
MCH: 28.5 pg (ref 26.0–34.0)
MCHC: 32 g/dL (ref 30.0–36.0)
MCV: 88.9 fL (ref 80.0–100.0)
Platelets: 372 10*3/uL (ref 150–400)
RBC: 4.67 MIL/uL (ref 3.87–5.11)
RDW: 13.6 % (ref 11.5–15.5)
WBC: 10.6 10*3/uL — ABNORMAL HIGH (ref 4.0–10.5)
nRBC: 0 % (ref 0.0–0.2)

## 2020-12-16 LAB — BASIC METABOLIC PANEL
Anion gap: 9 (ref 5–15)
BUN: 24 mg/dL — ABNORMAL HIGH (ref 8–23)
CO2: 27 mmol/L (ref 22–32)
Calcium: 9.1 mg/dL (ref 8.9–10.3)
Chloride: 103 mmol/L (ref 98–111)
Creatinine, Ser: 1.22 mg/dL — ABNORMAL HIGH (ref 0.44–1.00)
GFR, Estimated: 50 mL/min — ABNORMAL LOW (ref >60)
Glucose, Bld: 99 mg/dL (ref 70–99)
Potassium: 3.9 mmol/L (ref 3.5–5.1)
Sodium: 139 mmol/L (ref 135–145)

## 2020-12-16 NOTE — Progress Notes (Signed)
COVID Vaccine Completed:Yes Date COVID Vaccine completed:Pt had 2 shots and a booster she doesn't know the dates. COVID vaccine manufacturer: [employer]   PCP - Juluis Mire Cardiologist - none  Chest x-ray - no EKG - 07/19/20-epic Stress Test - no ECHO - no Cardiac Cath - no Pacemaker/ICD device last checked:NA  Sleep Study - no CPAP -   Fasting Blood Sugar - NA Checks Blood Sugar _____ times a day  Blood Thinner Instructions:NA Aspirin Instructions: Last Dose:  Anesthesia review: no  Patient denies shortness of breath, fever, cough and chest pain at PAT appointment Yes. Pt is able to climb stairs, do housework and ADls with out any SOB.  Patient verbalized understanding of instructions that were given to them at the PAT appointment. Patient was also instructed that they will need to review over the PAT instructions again at home before surgery. Yes

## 2020-12-17 ENCOUNTER — Other Ambulatory Visit: Payer: Self-pay | Admitting: Podiatry

## 2020-12-17 DIAGNOSIS — M778 Other enthesopathies, not elsewhere classified: Secondary | ICD-10-CM

## 2020-12-22 ENCOUNTER — Other Ambulatory Visit (HOSPITAL_COMMUNITY)
Admission: RE | Admit: 2020-12-22 | Discharge: 2020-12-22 | Disposition: A | Discharge status: Home / Self Care | Payer: Medicaid Other | Source: Ambulatory Visit | Attending: Orthopaedic Surgery | Admitting: Orthopaedic Surgery

## 2020-12-22 DIAGNOSIS — Z01812 Encounter for preprocedural laboratory examination: Secondary | ICD-10-CM | POA: Diagnosis not present

## 2020-12-22 DIAGNOSIS — Z20822 Contact with and (suspected) exposure to covid-19: Secondary | ICD-10-CM | POA: Diagnosis not present

## 2020-12-22 LAB — SARS CORONAVIRUS 2 (TAT 6-24 HRS): SARS Coronavirus 2: NEGATIVE

## 2020-12-23 NOTE — H&P (Signed)
TOTAL KNEE ADMISSION H&P  Patient is being admitted for right total knee arthroplasty.  Subjective:  Chief Complaint:right knee pain.  HPI: Denise Morales, 60 y.o. female, has a history of pain and functional disability in the right knee due to arthritis and has failed non-surgical conservative treatments for greater than 12 weeks to includeNSAID's and/or analgesics, corticosteriod injections, viscosupplementation injections, flexibility and strengthening excercises, supervised PT with diminished ADL's post treatment, and activity modification.  Onset of symptoms was gradual, starting 2 years ago with gradually worsening course since that time. The patient noted no past surgery on the right knee(s).  Patient currently rates pain in the right knee(s) at 10 out of 10 with activity. Patient has night pain, worsening of pain with activity and weight bearing, pain that interferes with activities of daily living, pain with passive range of motion, crepitus, and joint swelling.  Patient has evidence of subchondral sclerosis, periarticular osteophytes, and joint space narrowing by imaging studies. There is no active infection.  Patient Active Problem List   Diagnosis Date Noted   Unilateral primary osteoarthritis, left knee 11/11/2020   Unilateral primary osteoarthritis, right knee 11/11/2020   Complete tear of right rotator cuff 08/12/2020   Hypertensive disorder 05/05/2019   Past Medical History:  Diagnosis Date   Depression    GERD (gastroesophageal reflux disease)    Hypertension     Past Surgical History:  Procedure Laterality Date   ABDOMINAL HYSTERECTOMY  1985   CHOLECYSTECTOMY     SHOULDER ARTHROSCOPY WITH ROTATOR CUFF REPAIR AND SUBACROMIAL DECOMPRESSION Right 08/12/2020   Procedure: RIGHT SHOULDER ARTHROSCOPY WITH ROTATOR CUFF REPAIR AND SUBACROMIAL DECOMPRESSION;  Surgeon: Mcarthur Rossetti, MD;  Location: [facility];  Service: Orthopedics;  Laterality: Right;     No current facility-administered medications for this encounter.   Current Outpatient Medications  Medication Sig Dispense Refill Last Dose   acetaminophen (TYLENOL) 500 MG tablet Take 1,000 mg by mouth every 6 (six) hours as needed for moderate pain.      baclofen (LIORESAL) 10 MG tablet TAKE 1/2 TO 1 (ONE-HALF TO ONE) TABLET BY MOUTH THREE TIMES DAILY AS NEEDED FOR MUSCLE SPASMS (Patient taking differently: Take 10 mg by mouth 3 (three) times daily as needed for muscle spasms. 1 (ONE) TABLET BY MOUTH THREE TIMES DAILY AS NEEDED FOR MUSCLE SPASMS) 90 tablet 3    escitalopram (LEXAPRO) 20 MG tablet Take 1 tablet (20 mg total) by mouth daily. 90 tablet 1    lisinopril-hydrochlorothiazide (ZESTORETIC) 10-12.5 MG tablet Take 1 tablet by mouth daily. 90 tablet 1    nabumetone (RELAFEN) 500 MG tablet Take 1 tablet by mouth twice daily as needed (Patient taking differently: Take 500 mg by mouth 2 (two) times daily.) 60 tablet 0    omeprazole (PRILOSEC) 40 MG capsule Take 1 capsule (40 mg total) by mouth daily. 90 capsule 1    pravastatin (PRAVACHOL) 40 MG tablet Take 1 tablet (40 mg total) by mouth daily. 90 tablet 1    traMADol (ULTRAM) 50 MG tablet Take 1 tablet (50 mg total) by mouth every 6 (six) hours as needed. (Patient taking differently: Take 50 mg by mouth every 6 (six) hours as needed for severe pain.) 30 tablet 0    Vitamin D-Vitamin K (VITAMIN K2-VITAMIN D3 PO) Take 1 tablet by mouth daily.      Cholecalciferol (VITAMIN D-3) 125 MCG (5000 UT) TABS Take 1 tablet by mouth daily. (Patient not taking: Reported on 12/10/2020) 90 tablet 3 Not Taking  Menatetrenone (VITAMIN K2) 100 MCG TABS Take 100 mcg by mouth daily. (Patient not taking: Reported on 12/10/2020) 90 tablet 3 Not Taking   tiZANidine (ZANAFLEX) 4 MG tablet Take 1 tablet (4 mg total) by mouth every 8 (eight) hours as needed for up to 12 doses for muscle spasms. (Patient not taking: Reported on 12/10/2020) 12 tablet 0 Not Taking   No  Known Allergies  Social History   Tobacco Use   Smoking status: Never   Smokeless tobacco: Never  Substance Use Topics   Alcohol use: Not Currently    Family History  Problem Relation Age of Onset   Diabetes Father    Diabetes Sister    Diabetes Paternal Aunt      Review of Systems  Musculoskeletal:  Positive for gait problem and joint swelling.  All other systems reviewed and are negative.  Objective:  Physical Exam Vitals reviewed.  Constitutional:      Appearance: Normal appearance.  HENT:     Head: Normocephalic and atraumatic.  Eyes:     Extraocular Movements: Extraocular movements intact.     Pupils: Pupils are equal, round, and reactive to light.  Cardiovascular:     Rate and Rhythm: Normal rate.  Pulmonary:     Effort: Pulmonary effort is normal.     Breath sounds: Normal breath sounds.  Abdominal:     Palpations: Abdomen is soft.  Musculoskeletal:     Cervical back: Normal range of motion and neck supple.     Right knee: Effusion and bony tenderness present. Decreased range of motion. Tenderness present over the medial joint line, lateral joint line and patellar tendon.  Neurological:     Mental Status: She is alert and oriented to person, place, and time.  Psychiatric:        Behavior: Behavior normal.    Vital signs in last 24 hours:    Labs:   Estimated body mass index is 35.35 kg/m as calculated from the following:   Height as of 12/16/20: 4\' 11"  (1.499 m).   Weight as of 12/16/20: 79.4 kg.   Imaging Review Plain radiographs demonstrate severe degenerative joint disease of the right knee(s). The overall alignment isneutral. The bone quality appears to be excellent for age and reported activity level.      Assessment/Plan:  End stage arthritis, right knee   The patient history, physical examination, clinical judgment of the provider and imaging studies are consistent with end stage degenerative joint disease of the right knee(s) and  total knee arthroplasty is deemed medically necessary. The treatment options including medical management, injection therapy arthroscopy and arthroplasty were discussed at length. The risks and benefits of total knee arthroplasty were presented and reviewed. The risks due to aseptic loosening, infection, stiffness, patella tracking problems, thromboembolic complications and other imponderables were discussed. The patient acknowledged the explanation, agreed to proceed with the plan and consent was signed. Patient is being admitted for inpatient treatment for surgery, pain control, PT, OT, prophylactic antibiotics, VTE prophylaxis, progressive ambulation and ADL's and discharge planning. The patient is planning to be discharged home with home health services

## 2020-12-24 ENCOUNTER — Observation Stay (HOSPITAL_COMMUNITY): Payer: Medicaid Other

## 2020-12-24 ENCOUNTER — Encounter (HOSPITAL_COMMUNITY)
Admission: RE | Disposition: A | Discharge status: Home / Self Care | Payer: Self-pay | Source: Home / Self Care | Attending: Orthopaedic Surgery

## 2020-12-24 ENCOUNTER — Observation Stay (HOSPITAL_COMMUNITY)
Admission: RE | Admit: 2020-12-24 | Discharge: 2020-12-26 | Disposition: A | Discharge status: Home / Self Care | Payer: Medicaid Other | Attending: Orthopaedic Surgery | Admitting: Orthopaedic Surgery

## 2020-12-24 ENCOUNTER — Encounter (HOSPITAL_COMMUNITY): Payer: Self-pay | Admitting: Orthopaedic Surgery

## 2020-12-24 ENCOUNTER — Ambulatory Visit (HOSPITAL_COMMUNITY): Payer: Medicaid Other | Admitting: Certified Registered Nurse Anesthetist

## 2020-12-24 ENCOUNTER — Other Ambulatory Visit: Payer: Self-pay

## 2020-12-24 DIAGNOSIS — I10 Essential (primary) hypertension: Secondary | ICD-10-CM | POA: Insufficient documentation

## 2020-12-24 DIAGNOSIS — Z96651 Presence of right artificial knee joint: Secondary | ICD-10-CM | POA: Diagnosis not present

## 2020-12-24 DIAGNOSIS — K219 Gastro-esophageal reflux disease without esophagitis: Secondary | ICD-10-CM | POA: Diagnosis not present

## 2020-12-24 DIAGNOSIS — M1711 Unilateral primary osteoarthritis, right knee: Secondary | ICD-10-CM | POA: Diagnosis not present

## 2020-12-24 DIAGNOSIS — Z79899 Other long term (current) drug therapy: Secondary | ICD-10-CM | POA: Diagnosis not present

## 2020-12-24 DIAGNOSIS — G8918 Other acute postprocedural pain: Secondary | ICD-10-CM | POA: Diagnosis not present

## 2020-12-24 HISTORY — PX: TOTAL KNEE ARTHROPLASTY: SHX125

## 2020-12-24 LAB — TYPE AND SCREEN
ABO/RH(D): O POS
Antibody Screen: NEGATIVE

## 2020-12-24 LAB — ABO/RH: ABO/RH(D): O POS

## 2020-12-24 IMAGING — DX DG KNEE 1-2V PORT*R*
2 series · 2 of 2 positions shown · non-contrast
Comparison: Right knee x-rays dated [DATE].

CLINICAL DATA: Right knee arthroplasty.

EXAM:
PORTABLE RIGHT KNEE - 1-2 VIEW

[knee ap]
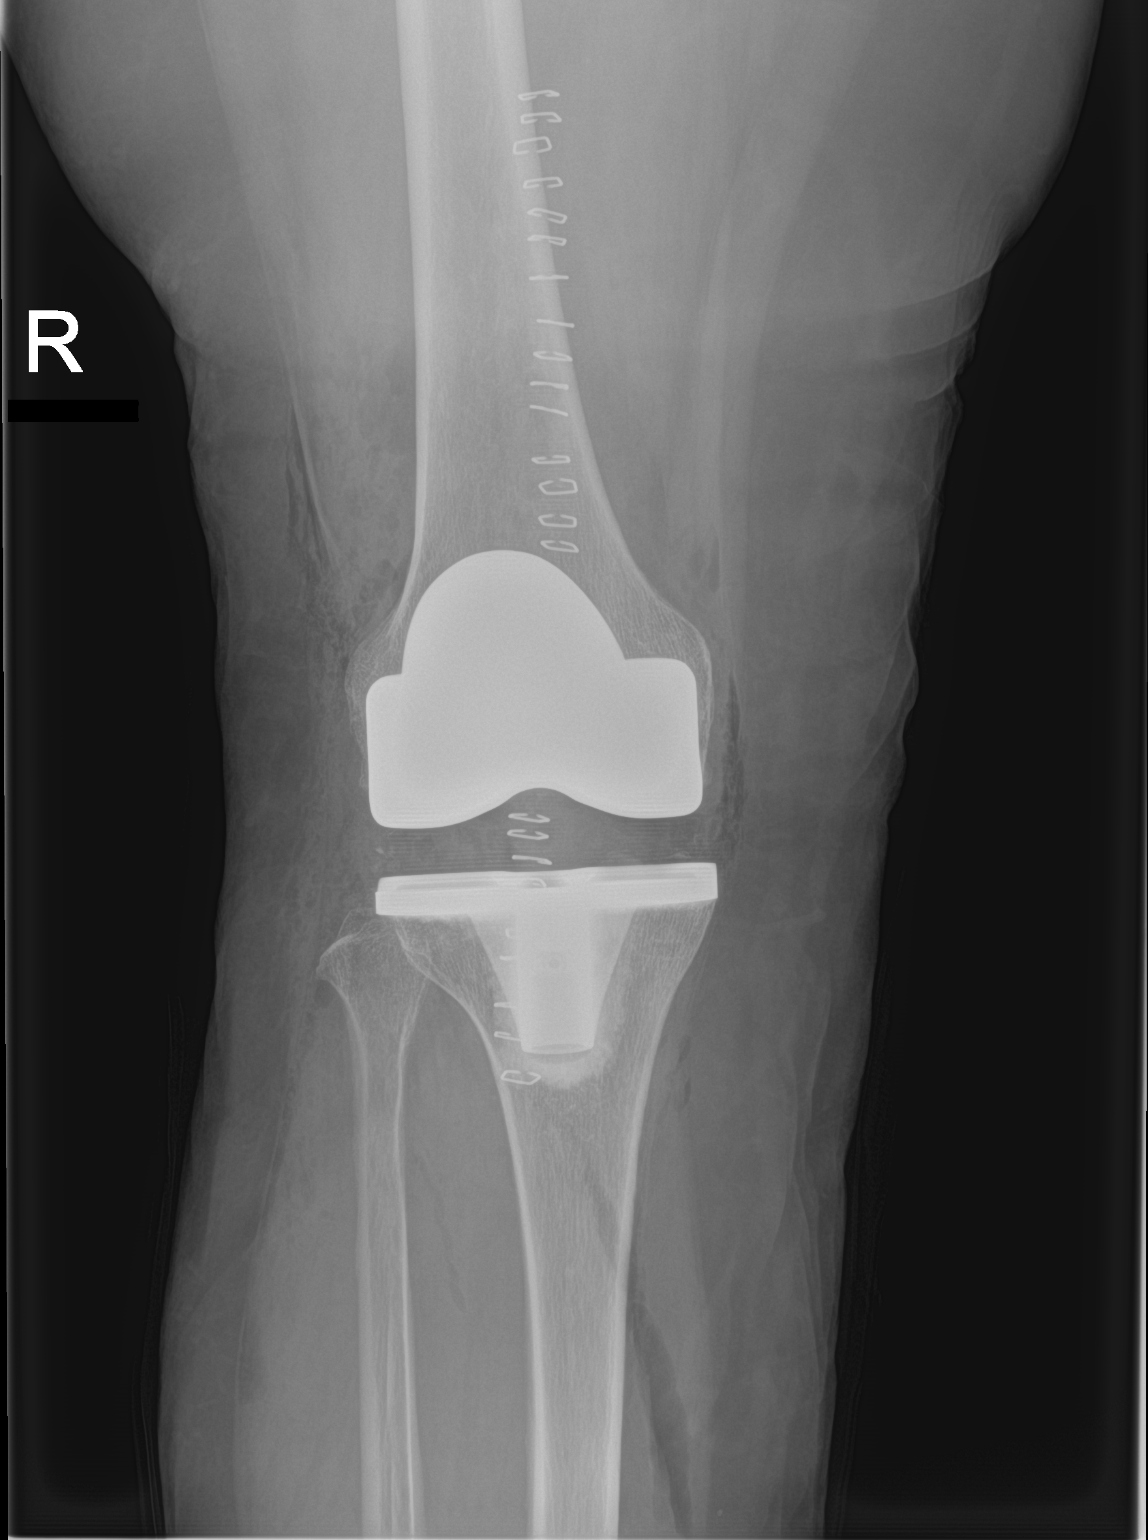

[knee lat]
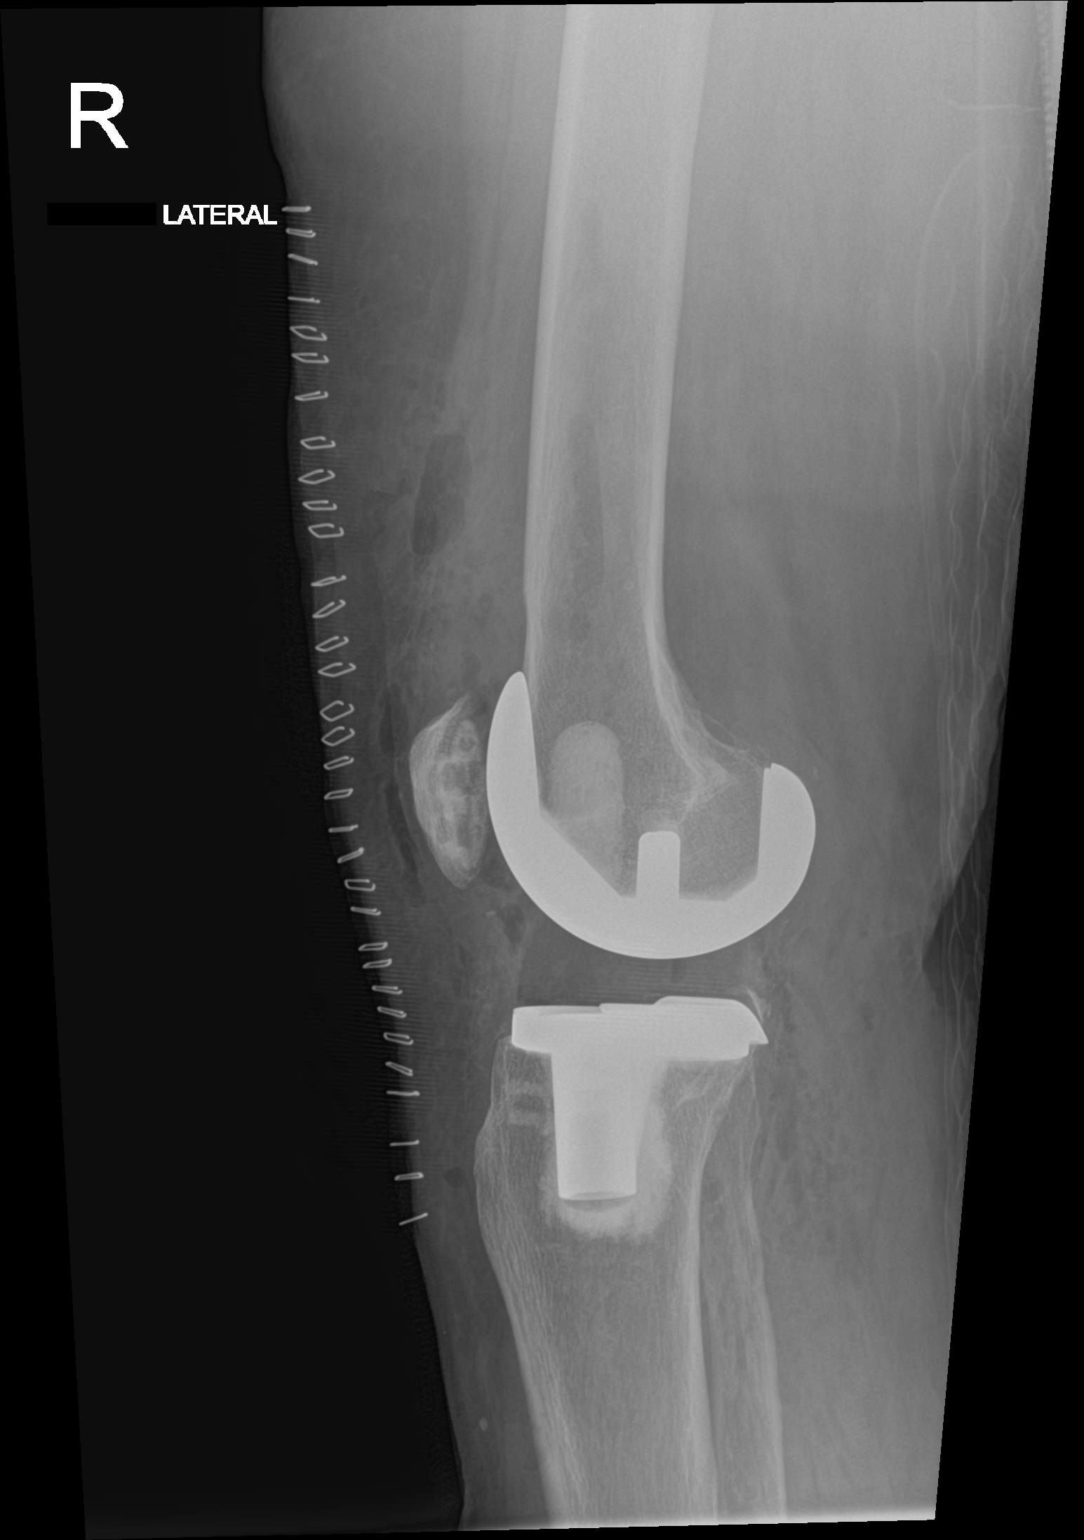

[2 of 2 positions shown; findings below may reference images not displayed]

FINDINGS: The right knee demonstrates a total knee arthroplasty without
evidence of hardware failure or complication. There is expected
intra-articular air. There is no fracture or dislocation. The
alignment is anatomic. Post-surgical changes noted in the
surrounding soft tissues.
IMPRESSION: 1. Right total knee arthroplasty without acute postoperative
complication.

## 2020-12-24 SURGERY — ARTHROPLASTY, KNEE, TOTAL
Anesthesia: Spinal | Site: Knee | Laterality: Right

## 2020-12-24 MED ORDER — MIDAZOLAM HCL 2 MG/2ML IJ SOLN
INTRAMUSCULAR | Status: AC
  Filled 2020-12-24: qty 2

## 2020-12-24 MED ORDER — LISINOPRIL 10 MG PO TABS
10.0000 mg | ORAL_TABLET | Freq: Every day | ORAL | Status: DC
  Filled 2020-12-24 (×2): qty 1

## 2020-12-24 MED ORDER — LIDOCAINE 2% (20 MG/ML) 5 ML SYRINGE
INTRAMUSCULAR | Status: AC
  Filled 2020-12-24: qty 5

## 2020-12-24 MED ORDER — DOCUSATE SODIUM 100 MG PO CAPS
100.0000 mg | ORAL_CAPSULE | Freq: Two times a day (BID) | ORAL | Status: DC
  Administered 2020-12-24 – 2020-12-26 (×5): 100 mg via ORAL
  Filled 2020-12-24 (×5): qty 1

## 2020-12-24 MED ORDER — OXYCODONE HCL 5 MG/5ML PO SOLN: 5.0000 mg | Freq: Once | ORAL | Status: DC | PRN

## 2020-12-24 MED ORDER — DEXAMETHASONE SODIUM PHOSPHATE 10 MG/ML IJ SOLN
INTRAMUSCULAR | Status: DC | PRN
  Administered 2020-12-24: 10 mg via INTRAVENOUS

## 2020-12-24 MED ORDER — METHOCARBAMOL 500 MG PO TABS
500.0000 mg | ORAL_TABLET | Freq: Four times a day (QID) | ORAL | Status: DC | PRN
  Administered 2020-12-24 – 2020-12-26 (×7): 500 mg via ORAL
  Filled 2020-12-24 (×7): qty 1

## 2020-12-24 MED ORDER — PROPOFOL 10 MG/ML IV BOLUS
INTRAVENOUS | Status: DC | PRN
  Administered 2020-12-24: 20 mg via INTRAVENOUS

## 2020-12-24 MED ORDER — OXYCODONE HCL 5 MG PO TABS
5.0000 mg | ORAL_TABLET | ORAL | Status: DC | PRN
Start: 2020-12-24 — End: 2020-12-26
  Administered 2020-12-24: 10 mg via ORAL
  Administered 2020-12-25: 5 mg via ORAL
  Administered 2020-12-25 – 2020-12-26 (×4): 10 mg via ORAL
  Filled 2020-12-24: qty 2
  Filled 2020-12-24: qty 1
  Filled 2020-12-24 (×4): qty 2

## 2020-12-24 MED ORDER — ONDANSETRON HCL 4 MG PO TABS: 4.0000 mg | ORAL_TABLET | Freq: Four times a day (QID) | ORAL | Status: DC | PRN

## 2020-12-24 MED ORDER — ONDANSETRON HCL 4 MG/2ML IJ SOLN
INTRAMUSCULAR | Status: AC
  Filled 2020-12-24: qty 2

## 2020-12-24 MED ORDER — PHENYLEPHRINE HCL (PRESSORS) 10 MG/ML IV SOLN
INTRAVENOUS | Status: AC
  Filled 2020-12-24: qty 1

## 2020-12-24 MED ORDER — PROPOFOL 500 MG/50ML IV EMUL
INTRAVENOUS | Status: DC | PRN
  Administered 2020-12-24: 100 ug/kg/min via INTRAVENOUS

## 2020-12-24 MED ORDER — ACETAMINOPHEN 325 MG PO TABS: 325.0000 mg | ORAL_TABLET | Freq: Four times a day (QID) | ORAL | Status: DC | PRN

## 2020-12-24 MED ORDER — ESCITALOPRAM OXALATE 20 MG PO TABS
20.0000 mg | ORAL_TABLET | Freq: Every day | ORAL | Status: DC
  Administered 2020-12-25 – 2020-12-26 (×2): 20 mg via ORAL
  Filled 2020-12-24 (×2): qty 1

## 2020-12-24 MED ORDER — HYDROCHLOROTHIAZIDE 12.5 MG PO CAPS
12.5000 mg | ORAL_CAPSULE | Freq: Every day | ORAL | Status: DC
  Filled 2020-12-24 (×2): qty 1

## 2020-12-24 MED ORDER — ASPIRIN 81 MG PO CHEW
81.0000 mg | CHEWABLE_TABLET | Freq: Two times a day (BID) | ORAL | Status: DC
  Administered 2020-12-24 – 2020-12-26 (×4): 81 mg via ORAL
  Filled 2020-12-24 (×4): qty 1

## 2020-12-24 MED ORDER — METOCLOPRAMIDE HCL 5 MG PO TABS
5.0000 mg | ORAL_TABLET | Freq: Three times a day (TID) | ORAL | Status: DC | PRN
Start: 2020-12-24 — End: 2020-12-26

## 2020-12-24 MED ORDER — PANTOPRAZOLE SODIUM 40 MG PO TBEC
40.0000 mg | DELAYED_RELEASE_TABLET | Freq: Every day | ORAL | Status: DC
  Administered 2020-12-25 – 2020-12-26 (×2): 40 mg via ORAL
  Filled 2020-12-24 (×2): qty 1

## 2020-12-24 MED ORDER — BUPIVACAINE-EPINEPHRINE (PF) 0.25% -1:200000 IJ SOLN
INTRAMUSCULAR | Status: AC
  Filled 2020-12-24: qty 30

## 2020-12-24 MED ORDER — ORAL CARE MOUTH RINSE
15.0000 mL | Freq: Once | OROMUCOSAL | Status: AC
Start: 2020-12-24 — End: 2020-12-24

## 2020-12-24 MED ORDER — TRANEXAMIC ACID-NACL 1000-0.7 MG/100ML-% IV SOLN
1000.0000 mg | INTRAVENOUS | Status: AC
  Administered 2020-12-24: 1000 mg via INTRAVENOUS
  Filled 2020-12-24: qty 100

## 2020-12-24 MED ORDER — ONDANSETRON HCL 4 MG/2ML IJ SOLN: 4.0000 mg | Freq: Four times a day (QID) | INTRAMUSCULAR | Status: DC | PRN

## 2020-12-24 MED ORDER — LACTATED RINGERS IV SOLN: INTRAVENOUS | Status: DC

## 2020-12-24 MED ORDER — LISINOPRIL-HYDROCHLOROTHIAZIDE 10-12.5 MG PO TABS: 1.0000 | ORAL_TABLET | Freq: Every day | ORAL | Status: DC

## 2020-12-24 MED ORDER — PROPOFOL 1000 MG/100ML IV EMUL
INTRAVENOUS | Status: AC
  Filled 2020-12-24: qty 100

## 2020-12-24 MED ORDER — ALUM & MAG HYDROXIDE-SIMETH 200-200-20 MG/5ML PO SUSP: 30.0000 mL | ORAL | Status: DC | PRN

## 2020-12-24 MED ORDER — OXYCODONE HCL 5 MG PO TABS: 5.0000 mg | ORAL_TABLET | Freq: Once | ORAL | Status: DC | PRN

## 2020-12-24 MED ORDER — ONDANSETRON HCL 4 MG/2ML IJ SOLN
INTRAMUSCULAR | Status: DC | PRN
  Administered 2020-12-24: 4 mg via INTRAVENOUS

## 2020-12-24 MED ORDER — HYDROMORPHONE HCL 1 MG/ML IJ SOLN
0.5000 mg | INTRAMUSCULAR | Status: DC | PRN
  Administered 2020-12-24 – 2020-12-25 (×3): 1 mg via INTRAVENOUS
  Filled 2020-12-24 (×3): qty 1

## 2020-12-24 MED ORDER — FENTANYL CITRATE (PF) 100 MCG/2ML IJ SOLN
INTRAMUSCULAR | Status: AC
  Filled 2020-12-24: qty 2

## 2020-12-24 MED ORDER — PHENYLEPHRINE HCL-NACL 10-0.9 MG/250ML-% IV SOLN
INTRAVENOUS | Status: DC | PRN
  Administered 2020-12-24: 25 ug/min via INTRAVENOUS

## 2020-12-24 MED ORDER — POVIDONE-IODINE 10 % EX SWAB
2.0000 "application " | Freq: Once | CUTANEOUS | Status: AC
  Administered 2020-12-24: 2 via TOPICAL

## 2020-12-24 MED ORDER — FENTANYL CITRATE (PF) 100 MCG/2ML IJ SOLN
INTRAMUSCULAR | Status: DC | PRN
  Administered 2020-12-24: 100 ug via INTRAVENOUS

## 2020-12-24 MED ORDER — AMISULPRIDE (ANTIEMETIC) 5 MG/2ML IV SOLN: 10.0000 mg | Freq: Once | INTRAVENOUS | Status: DC | PRN

## 2020-12-24 MED ORDER — EPHEDRINE 5 MG/ML INJ
INTRAVENOUS | Status: AC
  Filled 2020-12-24: qty 10

## 2020-12-24 MED ORDER — MENTHOL 3 MG MT LOZG: 1.0000 | LOZENGE | OROMUCOSAL | Status: DC | PRN

## 2020-12-24 MED ORDER — LIDOCAINE HCL (CARDIAC) PF 100 MG/5ML IV SOSY
PREFILLED_SYRINGE | INTRAVENOUS | Status: DC | PRN
  Administered 2020-12-24: 60 mg via INTRAVENOUS

## 2020-12-24 MED ORDER — CEFAZOLIN SODIUM-DEXTROSE 2-4 GM/100ML-% IV SOLN
2.0000 g | INTRAVENOUS | Status: AC
  Administered 2020-12-24: 2 g via INTRAVENOUS
  Filled 2020-12-24: qty 100

## 2020-12-24 MED ORDER — STERILE WATER FOR IRRIGATION IR SOLN
Status: DC | PRN
  Administered 2020-12-24: 2000 mL

## 2020-12-24 MED ORDER — SODIUM CHLORIDE 0.9 % IR SOLN
Status: DC | PRN
  Administered 2020-12-24: 1000 mL

## 2020-12-24 MED ORDER — 0.9 % SODIUM CHLORIDE (POUR BTL) OPTIME
TOPICAL | Status: DC | PRN
  Administered 2020-12-24: 1000 mL

## 2020-12-24 MED ORDER — PHENOL 1.4 % MT LIQD: 1.0000 | OROMUCOSAL | Status: DC | PRN

## 2020-12-24 MED ORDER — PRAVASTATIN SODIUM 20 MG PO TABS
40.0000 mg | ORAL_TABLET | Freq: Every day | ORAL | Status: DC
  Administered 2020-12-24 – 2020-12-26 (×3): 40 mg via ORAL
  Filled 2020-12-24 (×3): qty 2

## 2020-12-24 MED ORDER — POLYETHYLENE GLYCOL 3350 17 G PO PACK: 17.0000 g | PACK | Freq: Every day | ORAL | Status: DC | PRN

## 2020-12-24 MED ORDER — BUPIVACAINE IN DEXTROSE 0.75-8.25 % IT SOLN
INTRATHECAL | Status: DC | PRN
  Administered 2020-12-24: 1.6 mL via INTRATHECAL

## 2020-12-24 MED ORDER — FENTANYL CITRATE (PF) 100 MCG/2ML IJ SOLN: 25.0000 ug | INTRAMUSCULAR | Status: DC | PRN

## 2020-12-24 MED ORDER — DIPHENHYDRAMINE HCL 12.5 MG/5ML PO ELIX
12.5000 mg | ORAL_SOLUTION | ORAL | Status: DC | PRN
Start: 2020-12-24 — End: 2020-12-26
  Administered 2020-12-25: 12.5 mg via ORAL
  Filled 2020-12-24: qty 5

## 2020-12-24 MED ORDER — METHOCARBAMOL 1000 MG/10ML IJ SOLN
500.0000 mg | Freq: Four times a day (QID) | INTRAMUSCULAR | Status: DC | PRN
  Filled 2020-12-24: qty 5

## 2020-12-24 MED ORDER — MIDAZOLAM HCL 5 MG/5ML IJ SOLN
INTRAMUSCULAR | Status: DC | PRN
  Administered 2020-12-24: 2 mg via INTRAVENOUS

## 2020-12-24 MED ORDER — EPHEDRINE SULFATE-NACL 50-0.9 MG/10ML-% IV SOSY
PREFILLED_SYRINGE | INTRAVENOUS | Status: DC | PRN
  Administered 2020-12-24: 10 mg via INTRAVENOUS

## 2020-12-24 MED ORDER — ROPIVACAINE HCL 5 MG/ML IJ SOLN
INTRAMUSCULAR | Status: DC | PRN
  Administered 2020-12-24: 30 mL via PERINEURAL

## 2020-12-24 MED ORDER — SODIUM CHLORIDE 0.9 % IV SOLN: INTRAVENOUS | Status: DC

## 2020-12-24 MED ORDER — METOCLOPRAMIDE HCL 5 MG/ML IJ SOLN
5.0000 mg | Freq: Three times a day (TID) | INTRAMUSCULAR | Status: DC | PRN
Start: 2020-12-24 — End: 2020-12-26

## 2020-12-24 MED ORDER — DEXAMETHASONE SODIUM PHOSPHATE 10 MG/ML IJ SOLN
INTRAMUSCULAR | Status: AC
  Filled 2020-12-24: qty 1

## 2020-12-24 MED ORDER — OXYCODONE HCL 5 MG PO TABS
10.0000 mg | ORAL_TABLET | ORAL | Status: DC | PRN
  Administered 2020-12-24 (×3): 15 mg via ORAL
  Administered 2020-12-25 (×2): 10 mg via ORAL
  Filled 2020-12-24 (×2): qty 3
  Filled 2020-12-24 (×2): qty 2
  Filled 2020-12-24: qty 3

## 2020-12-24 MED ORDER — CHLORHEXIDINE GLUCONATE 0.12 % MT SOLN
15.0000 mL | Freq: Once | OROMUCOSAL | Status: AC
Start: 2020-12-24 — End: 2020-12-24
  Administered 2020-12-24: 15 mL via OROMUCOSAL

## 2020-12-24 MED ORDER — ONDANSETRON HCL 4 MG/2ML IJ SOLN: 4.0000 mg | Freq: Once | INTRAMUSCULAR | Status: DC | PRN

## 2020-12-24 MED ORDER — CEFAZOLIN SODIUM-DEXTROSE 1-4 GM/50ML-% IV SOLN
1.0000 g | Freq: Four times a day (QID) | INTRAVENOUS | Status: AC
  Administered 2020-12-24 (×2): 1 g via INTRAVENOUS
  Filled 2020-12-24 (×3): qty 50

## 2020-12-24 SURGICAL SUPPLY — 62 items
APL SKNCLS STERI-STRIP NONHPOA (GAUZE/BANDAGES/DRESSINGS)
BAG COUNTER SPONGE SURGICOUNT (BAG) IMPLANT
BAG SPEC THK2 15X12 ZIP CLS (MISCELLANEOUS) ×1
BAG SPNG CNTER NS LX DISP (BAG)
BAG ZIPLOCK 12X15 (MISCELLANEOUS) ×2 IMPLANT
BENZOIN TINCTURE PRP APPL 2/3 (GAUZE/BANDAGES/DRESSINGS) IMPLANT
BLADE SAG 18X100X1.27 (BLADE) ×2 IMPLANT
BLADE SURG SZ10 CARB STEEL (BLADE) ×4 IMPLANT
BNDG ELASTIC 6X5.8 VLCR STR LF (GAUZE/BANDAGES/DRESSINGS) ×3 IMPLANT
BOWL SMART MIX CTS (DISPOSABLE) ×1 IMPLANT
BSPLAT TIB 5D D CMNT STM RT (Knees) ×1 IMPLANT
CEMENT BONE R 1X40 (Cement) ×2 IMPLANT
CEMENT BONE SIMPLEX SPEEDSET (Cement) IMPLANT
COMP FEM CR CEMT STD SZ5 (Joint) ×2 IMPLANT
COMPONENT FEM CR CEMT STD SZ5 (Joint) IMPLANT
COOLER ICEMAN CLASSIC (MISCELLANEOUS) ×2 IMPLANT
COVER SURGICAL LIGHT HANDLE (MISCELLANEOUS) ×2 IMPLANT
CUFF TOURN SGL QUICK 34 (TOURNIQUET CUFF) ×2
CUFF TRNQT CYL 34X4.125X (TOURNIQUET CUFF) ×1 IMPLANT
DECANTER SPIKE VIAL GLASS SM (MISCELLANEOUS) IMPLANT
DRAPE U-SHAPE 47X51 STRL (DRAPES) ×2 IMPLANT
DRSG PAD ABDOMINAL 8X10 ST (GAUZE/BANDAGES/DRESSINGS) ×3 IMPLANT
DURAPREP 26ML APPLICATOR (WOUND CARE) ×2 IMPLANT
ELECT BLADE TIP CTD 4 INCH (ELECTRODE) ×2 IMPLANT
ELECT REM PT RETURN 15FT ADLT (MISCELLANEOUS) ×2 IMPLANT
GAUZE SPONGE 4X4 12PLY STRL (GAUZE/BANDAGES/DRESSINGS) ×2 IMPLANT
GAUZE XEROFORM 1X8 LF (GAUZE/BANDAGES/DRESSINGS) ×1 IMPLANT
GLOVE SRG 8 PF TXTR STRL LF DI (GLOVE) ×2 IMPLANT
GLOVE SURG ENC MOIS LTX SZ7.5 (GLOVE) ×2 IMPLANT
GLOVE SURG LTX SZ8 (GLOVE) ×2 IMPLANT
GLOVE SURG UNDER POLY LF SZ8 (GLOVE) ×4
GOWN STRL REUS W/TWL XL LVL3 (GOWN DISPOSABLE) ×4 IMPLANT
HANDPIECE INTERPULSE COAX TIP (DISPOSABLE) ×2
HDLS TROCR DRIL PIN KNEE 75 (PIN) ×2
HOLDER FOLEY CATH W/STRAP (MISCELLANEOUS) ×1 IMPLANT
IMMOBILIZER KNEE 20 (SOFTGOODS) ×2
IMMOBILIZER KNEE 20 THIGH 36 (SOFTGOODS) ×1 IMPLANT
KIT TURNOVER KIT A (KITS) ×2 IMPLANT
LINER ASF PERS CD/1-2 16 RT (Liner) ×1 IMPLANT
NS IRRIG 1000ML POUR BTL (IV SOLUTION) ×2 IMPLANT
PACK TOTAL KNEE CUSTOM (KITS) ×2 IMPLANT
PAD COLD SHLDR WRAP-ON (PAD) ×2 IMPLANT
PADDING CAST COTTON 6X4 STRL (CAST SUPPLIES) ×3 IMPLANT
PENCIL SMOKE EVACUATOR (MISCELLANEOUS) ×1 IMPLANT
PIN DRILL HDLS TROCAR 75 4PK (PIN) IMPLANT
PROTECTOR NERVE ULNAR (MISCELLANEOUS) ×2 IMPLANT
SCREW FEMALE HEX FIX 25X2.5 (ORTHOPEDIC DISPOSABLE SUPPLIES) ×1 IMPLANT
SET HNDPC FAN SPRY TIP SCT (DISPOSABLE) ×1 IMPLANT
SET PAD KNEE POSITIONER (MISCELLANEOUS) ×2 IMPLANT
STAPLER VISISTAT 35W (STAPLE) ×1 IMPLANT
STEM POLY PAT PLY 29M KNEE (Knees) ×1 IMPLANT
STEM TIBIA 5 DEG SZ D R KNEE (Knees) IMPLANT
STRIP CLOSURE SKIN 1/2X4 (GAUZE/BANDAGES/DRESSINGS) IMPLANT
SUT MNCRL AB 4-0 PS2 18 (SUTURE) IMPLANT
SUT VIC AB 0 CT1 27 (SUTURE) ×2
SUT VIC AB 0 CT1 27XBRD ANTBC (SUTURE) ×1 IMPLANT
SUT VIC AB 1 CT1 36 (SUTURE) ×4 IMPLANT
SUT VIC AB 2-0 CT1 27 (SUTURE) ×4
SUT VIC AB 2-0 CT1 TAPERPNT 27 (SUTURE) ×2 IMPLANT
TIBIA STEM 5 DEG SZ D R KNEE (Knees) ×2 IMPLANT
TRAY FOLEY MTR SLVR 16FR STAT (SET/KITS/TRAYS/PACK) IMPLANT
WATER STERILE IRR 1000ML POUR (IV SOLUTION) ×4 IMPLANT

## 2020-12-24 NOTE — TOC Transition Note (Signed)
Transition of Care [facility]) - CM/SW Discharge Note   Patient Details  Name: Denise Morales MRN: [id number] Date of Birth: [birth year]  [facility] CM/SW Contact:  Lennart Pall, LCSW Phone Number: 12/24/2020, 2:04 PM   Clinical Narrative:    Met with pt and spouse today and confirming DME and HHPT referral needs.  DME arranged with [facility] and HHPT via [facility].  Plan home with spouse to assist.  No further TOC needs.   Final next level of care: [facility] Barriers to Discharge: Continued Medical Work up   Patient Goals and CMS Choice Patient states their goals for this hospitalization and ongoing recovery are:: return home      Discharge Placement                       Discharge Plan and Services                DME Arranged: 3-N-1, Walker rolling DME Agency: [facility] Date DME Agency Contacted: 12/24/20 Time DME Agency Contacted: 74 Representative spoke with at DME Agency: Freda Munro HH Arranged: PT [place]: [place] Date [place]: 12/24/20 Time [place]: [place] Representative spoke with at [place]: [place] Determinants of Health (Portal) Interventions     Readmission Risk Interventions No flowsheet data found.

## 2020-12-24 NOTE — Evaluation (Signed)
Physical Therapy Evaluation Patient Details Name: Denise Morales MRN: [id number] DOB: 12/01/1960 Today's Date: 12/24/2020   History of Present Illness  Patient is 60 y.o. female s/p Rt TKA on 12/24/20 with PMH significant for GERD, HTN, depression.    Clinical Impression  Denise Morales is a 60 y.o. female POD 0 s/p Rt TKA. Patient reports independence with [facility] for household mobility and scoot for [place] mobility at baseline. Patient is now limited by functional impairments (see PT problem list below) and requires min assist for transfers with RW. Patient was limited by pain and slight dizziness in standing and limited to stand pivot/step transfer to recliner. Patient instructed in exercise to facilitate circulation to manage edema and reduce risk of DVT. Patient will benefit from continued skilled PT interventions to address impairments and progress towards PLOF. Acute PT will follow to progress mobility and stair training in preparation for safe discharge home.     Follow Up Recommendations Follow surgeon's recommendation for DC plan and follow-up therapies;Home health PT    Equipment Recommendations  Rolling walker with 5" wheels;3in1 (PT)    Recommendations for Other Services       Precautions / Restrictions Precautions Precautions: Fall Restrictions Weight Bearing Restrictions: No Other Position/Activity Restrictions: WBAT      Mobility  Bed Mobility Overal bed mobility: Needs Assistance Bed Mobility: Supine to Sit     Supine to sit: Min assist;HOB elevated     General bed mobility comments: cues for use of bed rail, assist to    Transfers Overall transfer level: Needs assistance Equipment used: Rolling walker (2 wheeled) Transfers: Sit to/from [facility] Sit to Stand: Min assist Stand pivot transfers: Min assist       General transfer comment: cues for safe technique/hand placement, assist required to rise and steady. pt hesitant to WB  on Rt LE and immobilzer in place for safety to prevent buckling. Patient requried cues to prevent valsalva as pt has tendency to hold breath. Min asisst to manage walker and sequence side steps to Recliner.  Ambulation/Gait                Stairs            Wheelchair Mobility    Modified Rankin (Stroke Patients Only)       Balance Overall balance assessment: Needs assistance Sitting-balance support: Feet supported Sitting balance-Leahy Scale: Fair     Standing balance support: During functional activity;Bilateral upper extremity supported Standing balance-Leahy Scale: Poor                               Pertinent Vitals/Pain Pain Assessment: 0-10 Pain Score: 5  Pain Location: Rt knee Pain Descriptors / Indicators: Aching;Discomfort Pain Intervention(s): Limited activity within patient's tolerance;Monitored during session;Premedicated before session;Repositioned;Ice applied    Home Living Family/patient expects to be discharged to:: Private residence Living Arrangements: Spouse/significant other Available Help at Discharge: Family Type of Home: House Home Access: Stairs to enter Entrance Stairs-Rails: Right Entrance Stairs-Number of Steps: 3+1 Home Layout: One level Home Equipment: Environmental consultant - 2 wheels;Cane - single point;Walker - 4 wheels;Bedside commode;Shower seat;Electric scooter Additional Comments: pt's husband can assist at home and her daughter will also help out    Prior Function Level of Independence: Independent with assistive device(s)         Comments: pt using SPC and sooter to get aroudn for long distances     Hand Dominance  Dominant Hand: Right    Extremity/Trunk Assessment   Upper Extremity Assessment Upper Extremity Assessment: Overall WFL for tasks assessed    Lower Extremity Assessment Lower Extremity Assessment: RLE deficits/detail RLE Deficits / Details: pt limited by pain with elevated, able to complete SLR  off ~3 inches above bed. immobilizer donned for mobility. RLE Sensation: WNL RLE Coordination: WNL    Cervical / Trunk Assessment Cervical / Trunk Assessment: Normal  Communication   Communication: No difficulties  Cognition Arousal/Alertness: Awake/alert Behavior During Therapy: WFL for tasks assessed/performed Overall Cognitive Status: Within Functional Limits for tasks assessed                                        General Comments      Exercises Total Joint Exercises Ankle Circles/Pumps: AROM;Both;20 reps   Assessment/Plan    PT Assessment Patient needs continued PT services  PT Problem List Decreased strength;Decreased range of motion;Decreased mobility;Decreased balance;Decreased activity tolerance;Decreased knowledge of use of DME;Decreased safety awareness;Decreased knowledge of precautions;Pain       PT Treatment Interventions DME instruction;Gait training;Stair training;Functional mobility training;Therapeutic activities;Therapeutic exercise;Balance training;Patient/family education    PT Goals (Current goals can be found in the Care Plan section)  Acute Rehab PT Goals Patient Stated Goal: walk without so much pain PT Goal Formulation: With patient Time For Goal Achievement: 12/31/20 Potential to Achieve Goals: Good    Frequency 7X/week   Barriers to discharge        Co-evaluation               AM-PAC PT "6 Clicks" Mobility  Outcome Measure Help needed turning from your back to your side while in a flat bed without using bedrails?: A Little Help needed moving from lying on your back to sitting on the side of a flat bed without using bedrails?: A Little Help needed moving to and from a bed to a chair (including a wheelchair)?: A Little Help needed standing up from a chair using your arms (e.g., wheelchair or bedside chair)?: A Little Help needed to walk in hospital room?: A Little Help needed climbing 3-5 steps with a railing? : A  Lot 6 Click Score: 17    End of Session Equipment Utilized During Treatment: Gait belt;Right knee immobilizer Activity Tolerance: Patient tolerated treatment well;Patient limited by pain Patient left: in chair;with call bell/phone within reach;with chair alarm set Nurse Communication: Mobility status;Patient requests pain meds PT Visit Diagnosis: Muscle weakness (generalized) (M62.81);Difficulty in walking, not elsewhere classified (R26.2)    Time: 8676-1950 PT Time Calculation (min) (ACUTE ONLY): 24 min   Charges:   PT Evaluation $PT Eval Low Complexity: 1 Low PT Treatments $Therapeutic Activity: 8-22 mins        Verner Mould, DPT Acute Rehabilitation Services Office [phone] Pager [phone]   Jacques Navy 12/24/2020, 5:59 PM

## 2020-12-24 NOTE — Anesthesia Procedure Notes (Signed)
Anesthesia Regional Block: Adductor canal block   Pre-Anesthetic Checklist: , timeout performed,  Correct Patient, Correct Site, Correct Laterality,  Correct Procedure, Correct Position, site marked,  Risks and benefits discussed,  Surgical consent,  Pre-op evaluation,  At surgeon's request and post-op pain management  Laterality: Right  Prep: chloraprep       Needles:  Injection technique: Single-shot  Needle Type: Echogenic Stimulator Needle     Needle Length: 10cm  Needle Gauge: 20     Additional Needles:   Procedures:,,,, ultrasound used (permanent image in chart),,    Narrative:  Start time: 12/24/2020 7:00 AM End time: 12/24/2020 7:04 AM Injection made incrementally with aspirations every 5 mL.  Performed by: Personally  Anesthesiologist: Lidia Collum, MD  Additional Notes: Standard monitors applied. Skin prepped. Good needle visualization with ultrasound. Injection made in 5cc increments with no resistance to injection. Patient tolerated the procedure well.

## 2020-12-24 NOTE — Anesthesia Procedure Notes (Signed)
Spinal  Patient location during procedure: OR Reason for block: surgical anesthesia Staffing Performed: anesthesiologist  Anesthesiologist: Christhoper Busbee E, MD Preanesthetic Checklist Completed: patient identified, IV checked, risks and benefits discussed, surgical consent, monitors and equipment checked, pre-op evaluation and timeout performed Spinal Block Patient position: sitting Prep: DuraPrep and site prepped and draped Patient monitoring: continuous pulse ox, blood pressure and heart rate Approach: midline Location: L3-4 Injection technique: single-shot Needle Needle type: Pencan  Needle gauge: 24 G Needle length: 9 cm Assessment Events: CSF return Additional Notes Functioning IV was confirmed and monitors were applied. Sterile prep and drape, including hand hygiene and sterile gloves were used. The patient was positioned and the spine was prepped. The skin was anesthetized with lidocaine.  Free flow of clear CSF was obtained prior to injecting local anesthetic into the CSF. The needle was carefully withdrawn. The patient tolerated the procedure well.     

## 2020-12-24 NOTE — Brief Op Note (Signed)
12/24/2020  9:08 AM  PATIENT:  [name]  60 y.o. female  PRE-OPERATIVE DIAGNOSIS:  Osteoarthritis Right Knee  POST-OPERATIVE DIAGNOSIS:  Osteoarthritis Right Knee  PROCEDURE:  Procedure(s): RIGHT TOTAL KNEE ARTHROPLASTY (Right)  SURGEON:  Surgeon(s) and Role:    Mcarthur Rossetti, MD - Primary  PHYSICIAN ASSISTANT:  Benita Stabile, PA-C  ANESTHESIA:   regional and spinal  EBL:  50 mL   COUNTS:  YES  TOURNIQUET:   Total Tourniquet Time Documented: Thigh (Right) - 60 minutes Total: Thigh (Right) - 60 minutes   DICTATION: .Other Dictation: Dictation Number [id number]  PLAN OF CARE: Admit for overnight observation  PATIENT DISPOSITION:  PACU - hemodynamically stable.   Delay start of Pharmacological VTE agent (>24hrs) due to surgical blood loss or risk of bleeding: no

## 2020-12-24 NOTE — Transfer of Care (Signed)
Immediate Anesthesia Transfer of Care Note  Patient: Natosha Bou  Procedure(s) Performed: RIGHT TOTAL KNEE ARTHROPLASTY (Right: Knee)  Patient Location: PACU  Anesthesia Type:Spinal  Level of Consciousness: awake, alert , oriented and patient cooperative  Airway & Oxygen Therapy: Patient Spontanous Breathing and Patient connected to face mask oxygen  Post-op Assessment: Report given to RN and Post -op Vital signs reviewed and stable  Post vital signs: Reviewed and stable  Last Vitals:  Vitals Value Taken Time  BP 105/60 12/24/20 0930  Temp 36.8 C 12/24/20 0930  Pulse 86 12/24/20 0943  Resp 10 12/24/20 0943  SpO2 100 % 12/24/20 0943  Vitals shown include unvalidated device data.  Last Pain:  Vitals:   12/24/20 0930  TempSrc:   PainSc: 0-No pain      Patients Stated Pain Goal: 3 (60/45/40 9811)  Complications: No notable events documented.

## 2020-12-24 NOTE — Op Note (Signed)
NAMEJERIYAH, Denise Morales MEDICAL RECORD NO: [id number] ACCOUNT NO: [id number] DATE OF BIRTH: 12-09-1960 FACILITY: [facility] LOCATION: [facility] PHYSICIAN: Lind Guest. [name], MD  Operative Report   DATE OF PROCEDURE: 12/24/2020  PREOPERATIVE DIAGNOSES:  Primary osteoarthritis and degenerative joint disease, right knee.  POSTOPERATIVE DIAGNOSES:  Primary osteoarthritis and degenerative joint disease, right knee.  PROCEDURE:  Cemented right total knee arthroplasty.  IMPLANTS:  Biomet Zimmer Proformis knee with size 5 femur, size D tibial tray, 16 mm polyethylene insert, medial congruent polyethylene insert, size 29 patellar button.  SURGEON:  Lind Guest. [name], MD  ASSISTANT:   Erskine Emery, PA-C  ANESTHESIA: 1.  Right lower extremity adductor canal block. 2.  Spinal.  ANTIBIOTICS:  2 g IV Ancef.  BLOOD LOSS:  Less than 100 mL.  TOURNIQUET TIME:  Less than 1 hour.  COMPLICATIONS:  None.  INDICATIONS:  The patient is a 60 year old female with debilitating arthritis involving her right knee that has been well documented.  She has tried and failed all forms of conservative treatment.  At this point, her right knee pain is daily and it is  detrimentally affecting her mobility, her quality of life and activities of daily living to the point she does wish to proceed with the total knee arthroplasty and we have recommended this as well.  We talked in length and detail about the risk of acute  blood loss anemia, nerve and vessel injury, fracture, infection, DVT, implant failure and skin and soft tissue issues.  We talked about our goals being decrease pain, improve mobility and overall improve quality of life.  DESCRIPTION OF PROCEDURE:  After informed consent was obtained, appropriate right knee was marked, she was brought to the operating room after an adductor canal block was obtained in the right lower extremity in the holding room.  In the operating room,  she was sat up on the  operating table where spinal anesthesia was obtained.  She was then placed in the supine position on the operating table.  Foley catheter was placed and a nonsterile tourniquet was placed around the upper right thigh.  Her right  thigh, knee, leg, ankle and foot were prepped and draped with DuraPrep and sterile drapes.  A timeout was called.  She was identified as correct patient, correct right knee.  We then used Esmarch to wrap out the leg and tourniquet was inflated to 300 mm  pressure.  We then made a direct midline incision over the patella and carried this proximally and distally.  We dissected down the knee joint, carried out a medial parapatellar arthrotomy, finding a very large joint effusion.  With the knee in a flexed  position, we found that she had arthritic changes in all three compartments.  We removed remnants of ACL, PCL, medial and lateral meniscus as well as periarticular osteophytes around the knee.  Using the extramedullary cutting guide for making our  proximal tibia cut, we set this for a 3-degree slope with correction of varus and valgus.  We made this to take 2 mm off the high side.  We ended up taking more than we thought once we made this cut.  We went to the femur and used an intramedullary guide  for the femur for setting our distal femoral cut for a 10 mm distal femoral cut for the right knee at 5 degrees externally rotated.  We made that cut without difficulty, and brought the knee back down to full extension and with a 10 mm extension block,  she hyperextended as she did with the 12 and 14.  We then went back to the femur and put our femoral sizing guide based off the epicondylar axis and Whiteside's line.  Based off of this, we chose a size 5 femur.  We put a 4-in-1 cutting block for a size  5 femur, made our anterior and posterior cuts, followed by our chamfer cuts.  We then went to the tibia and chose a size D tibial tray for coverage, setting the rotation off the tibial  tubercle and the femur, we made our keel punch and drill off of this.   With a size trial D tibia, we trialed a 5 right femur and we went to a 16 mm polyethylene insert.  We felt that, that gave her the best stability.  We then made our patellar cut and drilled three holes for a size 29 patellar button.  With all trial  instrumentation in the knee, we put her through range of motion and felt to be stable to [place].  We then removed all trial instrumentation from the knee and irrigated the knee with normal saline solution using pulsatile lavage.  We then put our knee in a  flexed position and mixed our cement.  We then cemented our Zimmer tibial tray size D, followed by our real size right 5 femur.  We placed our 16 mm medial congruent polyethylene insert and cemented our patellar button.  We then held the knee compressed  and fully extended while the cement hardened.  Once it hardened, we put the knee through range of motion and it was again felt to be stable.  We then let the tourniquet down.  Hemostasis was obtained with electrocautery.  We closed the arthrotomy with  interrupted #1 Vicryl suture followed by 0 Vicryl to close the deep tissue and 2-0 Vicryl to close the subcutaneous tissue.  The skin was reapproximated with staples.  Xeroform, well-padded sterile dressing was applied.  She was taken to recovery room in  stable condition with all final counts being correct and no complications noted.  Of note, Benita Stabile, PA-C, did assist during the entire case and assistance was crucial for facilitating all aspects of this case.   SHW D: 12/24/2020 9:06:36 am T: 12/24/2020 9:48:00 am  JOB: [id number]/ [id number]

## 2020-12-24 NOTE — Anesthesia Preprocedure Evaluation (Addendum)
Anesthesia Evaluation  Patient identified by MRN, date of birth, ID band Patient awake    Reviewed: Allergy & Precautions, NPO status , Patient's Chart, lab work & pertinent test results  History of Anesthesia Complications Negative for: history of anesthetic complications  Airway Mallampati: II  TM Distance: >3 FB Neck ROM: Full    Dental   Pulmonary neg pulmonary ROS,    Pulmonary exam normal        Cardiovascular hypertension, Pt. on medications Normal cardiovascular exam     Neuro/Psych Depression negative neurological ROS     GI/Hepatic Neg liver ROS, GERD  Medicated,  Endo/Other  negative endocrine ROS  Renal/GU negative Renal ROS  negative genitourinary   Musculoskeletal  (+) Arthritis , Osteoarthritis,    Abdominal   Peds  Hematology negative hematology ROS (+)   Anesthesia Other Findings   Reproductive/Obstetrics                            Anesthesia Physical Anesthesia Plan  ASA: 2  Anesthesia Plan: Spinal   Post-op Pain Management:    Induction:   PONV Risk Score and Plan: 2 and Propofol infusion, Treatment may vary due to age or medical condition, Ondansetron and TIVA  Airway Management Planned: Nasal Cannula and Simple Face Mask  Additional Equipment: None  Intra-op Plan:   Post-operative Plan:   Informed Consent: I have reviewed the patients History and Physical, chart, labs and discussed the procedure including the risks, benefits and alternatives for the proposed anesthesia with the patient or authorized representative who has indicated his/her understanding and acceptance.       Plan Discussed with:   Anesthesia Plan Comments:         Anesthesia Quick Evaluation

## 2020-12-24 NOTE — Interval H&P Note (Signed)
History and Physical Interval Note: The patient understands fully that she is scheduled for a right total knee replacement to treat her right knee osteoarthritis.  There has been no acute or interval change in her medical status.  See recent H&P.  The risks and benefits of surgery been explained in detail and informed consent is obtained.  The right knee has been marked.  12/24/2020 7:01 AM  Denise Morales  has presented today for surgery, with the diagnosis of Osteoarthritis Right Knee.  The various methods of treatment have been discussed with the patient and family. After consideration of risks, benefits and other options for treatment, the patient has consented to  Procedure(s): RIGHT TOTAL KNEE ARTHROPLASTY (Right) as a surgical intervention.  The patient's history has been reviewed, patient examined, no change in status, stable for surgery.  I have reviewed the patient's chart and labs.  Questions were answered to the patient's satisfaction.     Mcarthur Rossetti

## 2020-12-24 NOTE — Anesthesia Postprocedure Evaluation (Signed)
Anesthesia Post Note  Patient: Denise Morales  Procedure(s) Performed: RIGHT TOTAL KNEE ARTHROPLASTY (Right: Knee)     Patient location during evaluation: PACU Anesthesia Type: Spinal Level of consciousness: oriented and awake and alert Pain management: pain level controlled Vital Signs Assessment: post-procedure vital signs reviewed and stable Respiratory status: spontaneous breathing, respiratory function stable and nonlabored ventilation Cardiovascular status: blood pressure returned to baseline and stable Postop Assessment: no headache, no backache, no apparent nausea or vomiting and spinal receding Anesthetic complications: no   No notable events documented.  Last Vitals:  Vitals:   12/24/20 1028 12/24/20 1030  BP:  113/88  Pulse: 83 85  Resp: 11 17  Temp: (!) 36.4 C   SpO2: 98% 99%    Last Pain:  Vitals:   12/24/20 1030  TempSrc:   PainSc: 0-No pain                 Lidia Collum

## 2020-12-24 NOTE — Addendum Note (Signed)
Addendum  created 12/24/20 1435 by Lidia Collum, MD   Child order released for a procedure order, Clinical Note Signed, Intraprocedure Blocks edited, Intraprocedure Meds edited, SmartForm saved

## 2020-12-25 DIAGNOSIS — M1711 Unilateral primary osteoarthritis, right knee: Secondary | ICD-10-CM | POA: Diagnosis not present

## 2020-12-25 LAB — BASIC METABOLIC PANEL
Anion gap: 9 (ref 5–15)
BUN: 25 mg/dL — ABNORMAL HIGH (ref 8–23)
CO2: 25 mmol/L (ref 22–32)
Calcium: 8.3 mg/dL — ABNORMAL LOW (ref 8.9–10.3)
Chloride: 106 mmol/L (ref 98–111)
Creatinine, Ser: 1.17 mg/dL — ABNORMAL HIGH (ref 0.44–1.00)
GFR, Estimated: 53 mL/min — ABNORMAL LOW (ref >60)
Glucose, Bld: 130 mg/dL — ABNORMAL HIGH (ref 70–99)
Potassium: 3.8 mmol/L (ref 3.5–5.1)
Sodium: 140 mmol/L (ref 135–145)

## 2020-12-25 LAB — CBC
HCT: 32.6 % — ABNORMAL LOW (ref 36.0–46.0)
Hemoglobin: 10.3 g/dL — ABNORMAL LOW (ref 12.0–15.0)
MCH: 28.8 pg (ref 26.0–34.0)
MCHC: 31.6 g/dL (ref 30.0–36.0)
MCV: 91.1 fL (ref 80.0–100.0)
Platelets: 320 10*3/uL (ref 150–400)
RBC: 3.58 MIL/uL — ABNORMAL LOW (ref 3.87–5.11)
RDW: 14.1 % (ref 11.5–15.5)
WBC: 11.9 10*3/uL — ABNORMAL HIGH (ref 4.0–10.5)
nRBC: 0 % (ref 0.0–0.2)

## 2020-12-25 MED ORDER — OXYCODONE HCL 5 MG PO TABS: 5.0000 mg | ORAL_TABLET | ORAL | 0 refills | Status: DC | PRN

## 2020-12-25 MED ORDER — ASPIRIN 81 MG PO CHEW: 81.0000 mg | CHEWABLE_TABLET | Freq: Two times a day (BID) | ORAL | 0 refills | Status: DC

## 2020-12-25 MED ORDER — SODIUM CHLORIDE 0.9 % IV BOLUS
500.0000 mL | Freq: Once | INTRAVENOUS | Status: AC
  Administered 2020-12-25: 500 mL via INTRAVENOUS

## 2020-12-25 MED ORDER — MELATONIN 3 MG PO TABS
3.0000 mg | ORAL_TABLET | Freq: Every day | ORAL | Status: DC
  Administered 2020-12-25: 3 mg via ORAL
  Filled 2020-12-25: qty 1

## 2020-12-25 NOTE — Progress Notes (Signed)
Due to recent low blood pressure readings, received verbal order from Dr. Jean Rosenthal, MD to hold this morning's blood pressure meds and to give a 500 ml bolus. Will start bolus/restart fluids and continue to monitor.

## 2020-12-25 NOTE — Progress Notes (Signed)
Subjective: 1 Day Post-Op Procedure(s) (LRB): RIGHT TOTAL KNEE ARTHROPLASTY (Right) Patient reports pain as moderate.  Wanting to go home later today   Objective: Vital signs in last 24 hours: Temp:  [97.6 F (36.4 C)-98.3 F (36.8 C)] 98.1 F (36.7 C) (07/02 0615) Pulse Rate:  [71-79] 71 (07/02 0929) Resp:  [15-16] 16 (07/02 0615) BP: (100-115)/(73-80) 106/80 (07/02 0929) SpO2:  [95 %-98 %] 96 % (07/02 0615)  Intake/Output from previous day: 07/01 0701 - 07/02 0700 In: 3895.3 [P.O.:1250; I.V.:2395.3; IV Piggyback:250] Out: 1200 [Urine:1150; Blood:50] Intake/Output this shift: Total I/O In: 653 [P.O.:300; I.V.:10.8; IV Piggyback:342.2] Out: 400 [Urine:400]  Recent Labs    12/25/20 0321  HGB 10.3*   Recent Labs    12/25/20 0321  WBC 11.9*  RBC 3.58*  HCT 32.6*  PLT 320   Recent Labs    12/25/20 0321  NA 140  K 3.8  CL 106  CO2 25  BUN 25*  CREATININE 1.17*  GLUCOSE 130*  CALCIUM 8.3*   No results for input(s): LABPT, INR in the last 72 hours.  Right lower extremity:  Dorsiflexion/Plantar flexion intact Incision: scant drainage Compartment soft   Assessment/Plan: 1 Day Post-Op Procedure(s) (LRB): RIGHT TOTAL KNEE ARTHROPLASTY (Right) Up with therapy Hypotension this AM.  Will  hold discharge today and most likely discharge to home tomorrow.     Darly Massi 12/25/2020, 12:35 PM

## 2020-12-25 NOTE — Discharge Instructions (Signed)

## 2020-12-25 NOTE — Plan of Care (Signed)
  Problem: Education: Goal: Knowledge of General Education information will improve Description: Including pain rating scale, medication(s)/side effects and non-pharmacologic comfort measures Outcome: Progressing   Problem: Health Behavior/Discharge Planning: Goal: Ability to manage health-related needs will improve Outcome: Progressing   Problem: Pain Managment: Goal: General experience of comfort will improve Outcome: Progressing   

## 2020-12-25 NOTE — Progress Notes (Signed)
Physical Therapy Treatment Patient Details Name: Denise Morales MRN: [id number] DOB: [birth year] Today's Date: 12/25/2020    History of Present Illness Patient is 60 y.o. female s/p Rt TKA on 12/24/20 with PMH significant for GERD, HTN, depression.    PT Comments    Progressing slowly. Increased pain this afternoon limiting session. Will progress activity as tolerated on tomorrow.    Follow Up Recommendations  Follow surgeon's recommendation for DC plan and follow-up therapies (plan is for HHPT)     Equipment Recommendations  Rolling walker with 5" wheels;3in1 (PT)    Recommendations for Other Services       Precautions / Restrictions Precautions Precautions: Fall Restrictions Weight Bearing Restrictions: No Other Position/Activity Restrictions: WBAT    Mobility  Bed Mobility Overal bed mobility: Needs Assistance Bed Mobility: Supine to Sit;Sit to Supine     Supine to sit: HOB elevated Sit to supine: Min assist;HOB elevated   General bed mobility comments: Assist for R LE. Increased time. Cues for safety, technique. Pt denied lightheadedness.    Transfers Overall transfer level: Needs assistance Equipment used: Rolling walker (2 wheeled) Transfers: Sit to/from Stand Sit to Stand: Min assist         General transfer comment: Assist to rise, steady, control descent. Cues for safety, technique, hand/LE placement. Increased time.  Ambulation/Gait Ambulation/Gait assistance: Min assist Gait Distance (Feet): 15 Feet (x2) Assistive device: Rolling walker (2 wheeled) Gait Pattern/deviations: Step-to pattern     General Gait Details: Cues for safety, technique, sequence. Assist to steady intermittently. Distance limited by pain this afternoon. She denied lightheadedness.   Stairs             Wheelchair Mobility    Modified Rankin (Stroke Patients Only)       Balance Overall balance assessment: Needs assistance         Standing balance support:  Bilateral upper extremity supported Standing balance-Leahy Scale: Fair                              Cognition Arousal/Alertness: Awake/alert Behavior During Therapy: WFL for tasks assessed/performed Overall Cognitive Status: Within Functional Limits for tasks assessed                                        Exercises      General Comments        Pertinent Vitals/Pain Pain Assessment: 0-10 Pain Score: 9  Pain Location: R knee/thigh Pain Descriptors / Indicators: Discomfort;Sore Pain Intervention(s): Limited activity within patient's tolerance;Monitored during session;Ice applied;Repositioned;RN gave pain meds during session    Home Living                      Prior Function            PT Goals (current goals can now be found in the care plan section) Progress towards PT goals: Progressing toward goals    Frequency    7X/week      PT Plan Current plan remains appropriate    Co-evaluation              AM-PAC PT "6 Clicks" Mobility   Outcome Measure  Help needed turning from your back to your side while in a flat bed without using bedrails?: A Little Help needed moving from lying on your back to sitting on the  side of a flat bed without using bedrails?: A Little Help needed moving to and from a bed to a chair (including a wheelchair)?: A Little Help needed standing up from a chair using your arms (e.g., wheelchair or bedside chair)?: A Little Help needed to walk in hospital room?: A Little Help needed climbing 3-5 steps with a railing? : A Little 6 Click Score: 18    End of Session Equipment Utilized During Treatment: Gait belt;Right knee immobilizer Activity Tolerance: Patient limited by pain Patient left: in bed;with call bell/phone within reach;with family/visitor present   PT Visit Diagnosis: Other abnormalities of gait and mobility (R26.89);Pain Pain - Right/Left: Right Pain - part of body: Knee     Time:  3382-5053 PT Time Calculation (min) (ACUTE ONLY): 24 min  Charges:  $Gait Training: 23-37 mins                         Doreatha Massed, PT Acute Rehabilitation  Office: [phone] Pager: [phone]

## 2020-12-25 NOTE — Progress Notes (Signed)
Physical Therapy Treatment Patient Details Name: Denise Morales MRN: [id number] DOB: Dec 07, 1960 Today's Date: 12/25/2020    History of Present Illness Patient is 60 y.o. female s/p Rt TKA on 12/24/20 with PMH significant for GERD, HTN, depression.    PT Comments    Progressing with mobility. Getting fluids again for low BP per pt. Pt tolerated short distance ambulation well. She denied lightheadedness. Dyspnea 2/4. Moderate pain with activity. Pt is hopeful to d/c home today. Will plan to have a 2nd session to continue gait and stair training this afternoon.     Follow Up Recommendations  Follow surgeon's recommendation for DC plan and follow-up therapies (plan is for HHPT)     Equipment Recommendations  Rolling walker with 5" wheels;3in1 (PT)    Recommendations for Other Services       Precautions / Restrictions Precautions Precautions: Fall Restrictions Weight Bearing Restrictions: No Other Position/Activity Restrictions: WBAT    Mobility  Bed Mobility Overal bed mobility: Needs Assistance Bed Mobility: Supine to Sit     Supine to sit: Min assist     General bed mobility comments: Assist for R LE. Increased time. Cues for safety, technique. Pt denied lightheadedness.    Transfers Overall transfer level: Needs assistance Equipment used: Rolling walker (2 wheeled) Transfers: Sit to/from Stand Sit to Stand: Min assist         General transfer comment: Assist to rise, steady, control descent. Cues for safety, technique, hand/LE placement. Increased time.  Ambulation/Gait Ambulation/Gait assistance: Min assist Gait Distance (Feet): 20 Feet (20'x1, 15'x1) Assistive device: Rolling walker (2 wheeled) Gait Pattern/deviations: Step-to pattern     General Gait Details: Cues for safety, technique, sequence. Assist to steady. Pt tolerated short distance well. She denied lightheadedness.   Stairs             Wheelchair Mobility    Modified Rankin (Stroke  Patients Only)       Balance Overall balance assessment: Needs assistance         Standing balance support: Bilateral upper extremity supported Standing balance-Leahy Scale: Fair                              Cognition Arousal/Alertness: Awake/alert Behavior During Therapy: WFL for tasks assessed/performed Overall Cognitive Status: Within Functional Limits for tasks assessed                                        Exercises Total Joint Exercises Ankle Circles/Pumps: AROM;Both;10 reps Quad Sets: AROM;Both;10 reps Heel Slides: AAROM;Right;10 reps Hip ABduction/ADduction: AAROM;Right;10 reps Straight Leg Raises: AAROM;Right;10 reps Goniometric ROM: ~10-55 degrees    General Comments        Pertinent Vitals/Pain Pain Assessment: 0-10 Pain Score: 7  Pain Location: R knee/thigh Pain Descriptors / Indicators: Discomfort;Sore Pain Intervention(s): Limited activity within patient's tolerance;Monitored during session;Ice applied    Home Living                      Prior Function            PT Goals (current goals can now be found in the care plan section) Progress towards PT goals: Progressing toward goals    Frequency    7X/week      PT Plan Current plan remains appropriate    Co-evaluation  AM-PAC PT "6 Clicks" Mobility   Outcome Measure  Help needed turning from your back to your side while in a flat bed without using bedrails?: A Little Help needed moving from lying on your back to sitting on the side of a flat bed without using bedrails?: A Little Help needed moving to and from a bed to a chair (including a wheelchair)?: A Little Help needed standing up from a chair using your arms (e.g., wheelchair or bedside chair)?: A Little Help needed to walk in hospital room?: A Little Help needed climbing 3-5 steps with a railing? : A Little 6 Click Score: 18    End of Session Equipment Utilized During  Treatment: Gait belt;Right knee immobilizer Activity Tolerance: Patient tolerated treatment well Patient left: in chair;with call bell/phone within reach;with family/visitor present   PT Visit Diagnosis: Other abnormalities of gait and mobility (R26.89);Pain Pain - Right/Left: Right Pain - part of body: Knee     Time: 0149-9692 PT Time Calculation (min) (ACUTE ONLY): 28 min  Charges:  $Gait Training: 8-22 mins $Therapeutic Exercise: 8-22 mins                      Doreatha Massed, PT Acute Rehabilitation  Office: [phone] Pager: [phone]

## 2020-12-26 DIAGNOSIS — M1711 Unilateral primary osteoarthritis, right knee: Secondary | ICD-10-CM | POA: Diagnosis not present

## 2020-12-26 MED ORDER — BACLOFEN 10 MG PO TABS
10.0000 mg | ORAL_TABLET | Freq: Three times a day (TID) | ORAL | 0 refills | Status: DC | PRN
Start: 2020-12-26 — End: 2021-09-22

## 2020-12-26 NOTE — Progress Notes (Signed)
Patient ID: Denise Morales, female   DOB: Nov 23, 1960, 60 y.o.   MRN: [id number] The patient looks good today.  She is motivated and wants to go home and I agree with this.  Her blood pressures been running just a little bit low.  It is normal but low for her.  She is on blood pressure medications.  She does check things at home so I told her to hold off on her blood pressure medications unless she starts developing hypertension again.  She understands this completely.  We will discharge her to home today.

## 2020-12-26 NOTE — Plan of Care (Signed)
  Problem: Education: Goal: Knowledge of General Education information will improve Description Including pain rating scale, medication(s)/side effects and non-pharmacologic comfort measures Outcome: Progressing   Problem: Health Behavior/Discharge Planning: Goal: Ability to manage health-related needs will improve Outcome: Progressing   

## 2020-12-26 NOTE — Plan of Care (Signed)
°  Problem: Pain Management: °Goal: Pain level will decrease with appropriate interventions °Outcome: Progressing °  °Problem: Coping: °Goal: Level of anxiety will decrease °Outcome: Progressing °  °Problem: Safety: °Goal: Ability to remain free from injury will improve °Outcome: Progressing °  °

## 2020-12-26 NOTE — Discharge Summary (Signed)
Patient ID: Denise Morales MRN: [id number] DOB/AGE: [birth year] 60 y.o.  Admit date: 12/24/2020 Discharge date: 12/26/2020  Admission Diagnoses:  Principal Problem:   Unilateral primary osteoarthritis, right knee Active Problems:   Status post right knee replacement   Discharge Diagnoses:  Same  Past Medical History:  Diagnosis Date   Depression    GERD (gastroesophageal reflux disease)    Hypertension     Surgeries: Procedure(s): RIGHT TOTAL KNEE ARTHROPLASTY on 12/24/2020   Consultants:   Discharged Condition: Improved  Hospital Course: Denise Morales is an 60 y.o. female who was admitted 12/24/2020 for operative treatment ofUnilateral primary osteoarthritis, right knee. Patient has severe unremitting pain that affects sleep, daily activities, and work/hobbies. After pre-op clearance the patient was taken to the operating room on 12/24/2020 and underwent  Procedure(s): RIGHT TOTAL KNEE ARTHROPLASTY.    Patient was given perioperative antibiotics:  Anti-infectives (From admission, onward)    Start     Dose/Rate Route Frequency Ordered Stop   12/24/20 1330  ceFAZolin (ANCEF) IVPB 1 g/50 mL premix        1 g 100 mL/hr over 30 Minutes Intravenous Every 6 hours 12/24/20 1043 12/24/20 2129   12/24/20 0600  ceFAZolin (ANCEF) IVPB 2g/100 mL premix        2 g 200 mL/hr over 30 Minutes Intravenous On call to O.R. 12/24/20 0932 12/24/20 0729        Patient was given sequential compression devices, early ambulation, and chemoprophylaxis to prevent DVT.  Patient benefited maximally from hospital stay and there were no complications.    Recent vital signs: Patient Vitals for the past 24 hrs:  BP Temp Temp src Pulse Resp SpO2  12/26/20 0935 (!) 108/54 -- -- 83 -- --  12/26/20 0614 116/73 -- -- 84 -- 90 %  12/26/20 0610 96/72 98 F (36.7 C) Oral 86 -- 96 %  12/25/20 2046 111/61 98.3 F (36.8 C) Oral 88 18 91 %  12/25/20 1341 109/73 98 F (36.7 C) Oral 77 17 95 %     Recent  laboratory studies:  Recent Labs    12/25/20 0321  WBC 11.9*  HGB 10.3*  HCT 32.6*  PLT 320  NA 140  K 3.8  CL 106  CO2 25  BUN 25*  CREATININE 1.17*  GLUCOSE 130*  CALCIUM 8.3*     Discharge Medications:   Allergies as of 12/26/2020   No Known Allergies      Medication List     STOP taking these medications    acetaminophen 500 MG tablet Commonly known as: TYLENOL   nabumetone 500 MG tablet Commonly known as: RELAFEN   traMADol 50 MG tablet Commonly known as: ULTRAM       TAKE these medications    aspirin 81 MG chewable tablet Chew 1 tablet (81 mg total) by mouth 2 (two) times daily.   baclofen 10 MG tablet Commonly known as: LIORESAL Take 1 tablet (10 mg total) by mouth 3 (three) times daily as needed for muscle spasms. 1 (ONE) TABLET BY MOUTH THREE TIMES DAILY AS NEEDED FOR MUSCLE SPASMS   escitalopram 20 MG tablet Commonly known as: Lexapro Take 1 tablet (20 mg total) by mouth daily.   lisinopril-hydrochlorothiazide 10-12.5 MG tablet Commonly known as: ZESTORETIC Take 1 tablet by mouth daily.   omeprazole 40 MG capsule Commonly known as: PRILOSEC Take 1 capsule (40 mg total) by mouth daily.   oxyCODONE 5 MG immediate release tablet Commonly known as: Oxy IR/ROXICODONE Take 1  tablet (5 mg total) by mouth every 4 (four) hours as needed for moderate pain (pain score 4-6).   pravastatin 40 MG tablet Commonly known as: PRAVACHOL Take 1 tablet (40 mg total) by mouth daily.   VITAMIN K2-VITAMIN D3 PO Take 1 tablet by mouth daily.               Durable Medical Equipment  (From admission, onward)           Start     Ordered   12/24/20 1044  DME 3 n 1  Once        12/24/20 1043   12/24/20 1044  DME Walker rolling  Once       Question Answer Comment  Walker: With 5 Inch Wheels   Patient needs a walker to treat with the following condition Status post right knee replacement      12/24/20 1043            Diagnostic Studies:  DG Knee Right Port  Result Date: 12/24/2020 CLINICAL DATA:  Right knee arthroplasty. EXAM: PORTABLE RIGHT KNEE - 1-2 VIEW COMPARISON:  Right knee x-rays dated August 25, 2020. FINDINGS: The right knee demonstrates a total knee arthroplasty without evidence of hardware failure or complication. There is expected intra-articular air. There is no fracture or dislocation. The alignment is anatomic. Post-surgical changes noted in the surrounding soft tissues. IMPRESSION: 1. Right total knee arthroplasty without acute postoperative complication. Electronically Signed   By: Titus Dubin M.D.   On: 12/24/2020 11:07   DG Foot 2 Views Left  Result Date: 12/17/2020 Please see detailed radiograph report in office note.  DG Foot 2 Views Right  Result Date: 12/17/2020 Please see detailed radiograph report in office note.   Disposition: Discharge disposition: 01-Home or Self Care          Follow-up Information     Care, [facility] Follow up.   Specialty: [place] Why: to provide home health physical therapy Contact information: [address] [place] [address] [phone]         Mcarthur Rossetti, MD Follow up in 2 week(s).   Specialty: Orthopedic Surgery Contact information: [address] [place] [address] [phone]                  Signed: Mcarthur Rossetti 12/26/2020, 11:37 AM

## 2020-12-26 NOTE — Progress Notes (Signed)
Physical Therapy Treatment Patient Details Name: Denise Morales MRN: [id number] DOB: [birth year] Today's Date: 12/26/2020    History of Present Illness Patient is 61 y.o. female s/p Rt TKA on 12/24/20 with PMH significant for GERD, HTN, depression.    PT Comments    Progressing with mobility. Reviewed/practiced exercises, gait training, and stair training. All education completed. Pt is eager to d/c home after 1 session. Okay to d/c from PT standpoint.    Follow Up Recommendations  Follow surgeon's recommendation for DC plan and follow-up therapies     Equipment Recommendations  Rolling walker with 5" wheels;3in1 (PT)    Recommendations for Other Services       Precautions / Restrictions Precautions Precautions: Fall Restrictions Weight Bearing Restrictions: No RLE Weight Bearing: Weight bearing as tolerated    Mobility  Bed Mobility Overal bed mobility: Needs Assistance Bed Mobility: Supine to Sit     Supine to sit: HOB elevated;Min assist     General bed mobility comments: Assist for R LE. Increased time. Cues for safety, technique. Pt denied lightheadedness.    Transfers Overall transfer level: Needs assistance Equipment used: Rolling walker (2 wheeled) Transfers: Sit to/from Stand Sit to Stand: Min assist         General transfer comment: Assist to rise, steady, control descent. Cues for safety, technique, hand/LE placement. Increased time.  Ambulation/Gait Ambulation/Gait assistance: Min assist Gait Distance (Feet): 50 Feet Assistive device: Rolling walker (2 wheeled) Gait Pattern/deviations: Step-to pattern     General Gait Details: Cues for safety, technique, sequence. Assist to steady intermittently. Distance limited by pain/fatigue. She denied lightheadedness.   Stairs             Wheelchair Mobility    Modified Rankin (Stroke Patients Only)       Balance Overall balance assessment: Needs assistance         Standing balance  support: Bilateral upper extremity supported Standing balance-Leahy Scale: Poor                              Cognition Arousal/Alertness: Awake/alert Behavior During Therapy: WFL for tasks assessed/performed Overall Cognitive Status: Within Functional Limits for tasks assessed                                        Exercises Total Joint Exercises Ankle Circles/Pumps: AROM;Both;10 reps Quad Sets: AROM;Both;10 reps Heel Slides: AAROM;Right;10 reps Hip ABduction/ADduction: AAROM;10 reps;Right Straight Leg Raises: AAROM;Right;10 reps Goniometric ROM: ~10-55 degrees    General Comments        Pertinent Vitals/Pain Pain Assessment: 0-10 Pain Score: 7  Pain Location: R knee/thigh Pain Descriptors / Indicators: Sore;Discomfort Pain Intervention(s): Limited activity within patient's tolerance;Monitored during session;Ice applied    Home Living                      Prior Function            PT Goals (current goals can now be found in the care plan section) Progress towards PT goals: Progressing toward goals    Frequency    7X/week      PT Plan Current plan remains appropriate    Co-evaluation              AM-PAC PT "6 Clicks" Mobility   Outcome Measure  Help needed turning from your back  to your side while in a flat bed without using bedrails?: A Little Help needed moving from lying on your back to sitting on the side of a flat bed without using bedrails?: A Little Help needed moving to and from a bed to a chair (including a wheelchair)?: A Little Help needed standing up from a chair using your arms (e.g., wheelchair or bedside chair)?: A Little Help needed to walk in hospital room?: A Little Help needed climbing 3-5 steps with a railing? : A Little 6 Click Score: 18    End of Session Equipment Utilized During Treatment: Gait belt;Right knee immobilizer Activity Tolerance: Patient limited by pain;Patient limited by  fatigue Patient left: in chair;with call bell/phone within reach   PT Visit Diagnosis: Other abnormalities of gait and mobility (R26.89);Pain Pain - Right/Left: Right Pain - part of body: Knee     Time: 4401-0272 PT Time Calculation (min) (ACUTE ONLY): 33 min  Charges:  $Gait Training: 8-22 mins $Therapeutic Exercise: 8-22 mins                         Doreatha Massed, PT Acute Rehabilitation  Office: [phone] Pager: [phone]

## 2020-12-26 NOTE — Progress Notes (Signed)
The patient is alert and oriented and has been seen by her physician. The orders for discharge were written. IV has been removed. Went over discharge instructions with patient and family. She is about to be discharged via wheelchair with all of her belongings.

## 2020-12-27 DIAGNOSIS — M1712 Unilateral primary osteoarthritis, left knee: Secondary | ICD-10-CM | POA: Diagnosis not present

## 2020-12-27 DIAGNOSIS — K219 Gastro-esophageal reflux disease without esophagitis: Secondary | ICD-10-CM | POA: Diagnosis not present

## 2020-12-27 DIAGNOSIS — Z471 Aftercare following joint replacement surgery: Secondary | ICD-10-CM | POA: Diagnosis not present

## 2020-12-27 DIAGNOSIS — Z9181 History of falling: Secondary | ICD-10-CM | POA: Diagnosis not present

## 2020-12-27 DIAGNOSIS — I10 Essential (primary) hypertension: Secondary | ICD-10-CM | POA: Diagnosis not present

## 2020-12-27 DIAGNOSIS — Z7982 Long term (current) use of aspirin: Secondary | ICD-10-CM | POA: Diagnosis not present

## 2020-12-27 DIAGNOSIS — Z96651 Presence of right artificial knee joint: Secondary | ICD-10-CM | POA: Diagnosis not present

## 2020-12-27 DIAGNOSIS — F32A Depression, unspecified: Secondary | ICD-10-CM | POA: Diagnosis not present

## 2020-12-28 ENCOUNTER — Encounter (HOSPITAL_COMMUNITY): Payer: Self-pay | Admitting: Orthopaedic Surgery

## 2020-12-28 ENCOUNTER — Telehealth: Payer: Self-pay

## 2020-12-28 ENCOUNTER — Telehealth: Payer: Self-pay | Admitting: Orthopaedic Surgery

## 2020-12-28 NOTE — Telephone Encounter (Signed)
Transition Care Management Follow-up Telephone Call   Date of discharge and from where:[facility] on 12/26/2020 How have you been since you were released from the hospital? Doing Better  Any questions or concerns? No questions/concerns reported.  Items Reviewed: Did the pt receive and understand the discharge instructions provided? have the instructions and have no questions.  Medications obtained and verified? She said she have the medication list  and the hospital staff reviewed them in detail prior to discharge.  Any new allergies since your discharge? None reported  Do you have support at home? Yes, family Other (ie: DME, Home Health, etc)    pt has already a walker and shower commode    Functional Questionnaire: (I = Independent and D = Dependent) ADL's:  Independent.        Follow up appointments reviewed:   PCP Hospital f/u appt confirmed? NP Juluis Mire on 01/11/2021 @ 2:30 pm Specialist Hospital f/u appt confirmed? scheduled at this time  Are transportation arrangements needed? have transportation   If their condition worsens, is the pt aware to call  their PCP or go to the ED? Yes.Made pt aware if condition worsen or start experiencing rapid weight gain, chest pain, diff breathing, SOB, high fevers, or bleading to refer imediately to ED for further evaluation.  Was the patient provided with contact information for the PCP's office or ED? He has the phone number  Was the pt encouraged to call back with questions or concerns?yes

## 2020-12-28 NOTE — Telephone Encounter (Signed)
Pt called requesting a call back. Pt wants to know if she can rotate from oxy to tylenol. She would like to continue. Please call pt. At [phone].

## 2020-12-28 NOTE — Telephone Encounter (Signed)
Called pt and advised. She stated understanding  

## 2020-12-28 NOTE — Telephone Encounter (Signed)
Physical therapist called wanting to speak with a nurse about pt. The best call back number is [phone].

## 2020-12-28 NOTE — Telephone Encounter (Signed)
I called and talked to the pt and she stated that her discharge summary says to not take tylenol. Is it ok for her to alternate now?

## 2020-12-28 NOTE — Telephone Encounter (Signed)
I called the therapist and gave verbal ok for continued therapy

## 2020-12-29 DIAGNOSIS — F32A Depression, unspecified: Secondary | ICD-10-CM | POA: Diagnosis not present

## 2020-12-29 DIAGNOSIS — Z471 Aftercare following joint replacement surgery: Secondary | ICD-10-CM | POA: Diagnosis not present

## 2020-12-29 DIAGNOSIS — Z7982 Long term (current) use of aspirin: Secondary | ICD-10-CM | POA: Diagnosis not present

## 2020-12-29 DIAGNOSIS — M1712 Unilateral primary osteoarthritis, left knee: Secondary | ICD-10-CM | POA: Diagnosis not present

## 2020-12-29 DIAGNOSIS — Z96651 Presence of right artificial knee joint: Secondary | ICD-10-CM | POA: Diagnosis not present

## 2020-12-29 DIAGNOSIS — Z9181 History of falling: Secondary | ICD-10-CM | POA: Diagnosis not present

## 2020-12-29 DIAGNOSIS — I10 Essential (primary) hypertension: Secondary | ICD-10-CM | POA: Diagnosis not present

## 2020-12-29 DIAGNOSIS — K219 Gastro-esophageal reflux disease without esophagitis: Secondary | ICD-10-CM | POA: Diagnosis not present

## 2021-01-04 DIAGNOSIS — F32A Depression, unspecified: Secondary | ICD-10-CM | POA: Diagnosis not present

## 2021-01-04 DIAGNOSIS — Z7982 Long term (current) use of aspirin: Secondary | ICD-10-CM | POA: Diagnosis not present

## 2021-01-04 DIAGNOSIS — Z96651 Presence of right artificial knee joint: Secondary | ICD-10-CM | POA: Diagnosis not present

## 2021-01-04 DIAGNOSIS — Z471 Aftercare following joint replacement surgery: Secondary | ICD-10-CM | POA: Diagnosis not present

## 2021-01-04 DIAGNOSIS — K219 Gastro-esophageal reflux disease without esophagitis: Secondary | ICD-10-CM | POA: Diagnosis not present

## 2021-01-04 DIAGNOSIS — M1712 Unilateral primary osteoarthritis, left knee: Secondary | ICD-10-CM | POA: Diagnosis not present

## 2021-01-04 DIAGNOSIS — I10 Essential (primary) hypertension: Secondary | ICD-10-CM | POA: Diagnosis not present

## 2021-01-04 DIAGNOSIS — Z9181 History of falling: Secondary | ICD-10-CM | POA: Diagnosis not present

## 2021-01-06 ENCOUNTER — Ambulatory Visit (INDEPENDENT_AMBULATORY_CARE_PROVIDER_SITE_OTHER): Payer: Medicaid Other | Admitting: Orthopaedic Surgery

## 2021-01-06 ENCOUNTER — Encounter: Payer: Self-pay | Admitting: Orthopaedic Surgery

## 2021-01-06 DIAGNOSIS — Z96651 Presence of right artificial knee joint: Secondary | ICD-10-CM

## 2021-01-06 MED ORDER — OXYCODONE HCL 5 MG PO TABS: 5.0000 mg | ORAL_TABLET | Freq: Four times a day (QID) | ORAL | 0 refills | Status: DC | PRN

## 2021-01-06 NOTE — Progress Notes (Signed)
The patient will be 2 weeks tomorrow status post a right total knee arthroplasty.  This is her first postoperative visit since surgery.  She has been having therapy through home health and is ready to transition to outpatient physical therapy for her right knee.  She is ambulating with a walker.  She has been on a baby aspirin twice a day.  She denies any right calf pain.  She has been having surprisingly some bruising around her right shoulder.  She has had a history of right shoulder arthroscopy more recently.  Her staples have been removed and Steri-Strips placed over her right knee.  There is moderate swelling to be expected.  Her extension is full and her flexion of the knee is only to about 80 degrees.  I stressed the importance for setting her up for outpatient physical therapy and she agrees.  I did refill her oxycodone which is really not taking much of but I have encouraged her to take it through therapy.  She can try anti-inflammatories and Tylenol as well.  I would like to see her back in 4 weeks for repeat exam but no x-rays are needed.  All questions and concerns were answered and addressed.

## 2021-01-07 ENCOUNTER — Other Ambulatory Visit: Payer: Self-pay

## 2021-01-07 DIAGNOSIS — Z96651 Presence of right artificial knee joint: Secondary | ICD-10-CM

## 2021-01-11 ENCOUNTER — Other Ambulatory Visit: Payer: Self-pay

## 2021-01-11 ENCOUNTER — Telehealth (INDEPENDENT_AMBULATORY_CARE_PROVIDER_SITE_OTHER): Payer: Medicaid Other | Admitting: Primary Care

## 2021-01-11 ENCOUNTER — Ambulatory Visit: Payer: Medicaid Other | Attending: Orthopaedic Surgery

## 2021-01-11 DIAGNOSIS — M25561 Pain in right knee: Secondary | ICD-10-CM | POA: Diagnosis not present

## 2021-01-11 DIAGNOSIS — K219 Gastro-esophageal reflux disease without esophagitis: Secondary | ICD-10-CM

## 2021-01-11 DIAGNOSIS — M6281 Muscle weakness (generalized): Secondary | ICD-10-CM | POA: Diagnosis not present

## 2021-01-11 DIAGNOSIS — M25661 Stiffness of right knee, not elsewhere classified: Secondary | ICD-10-CM | POA: Diagnosis not present

## 2021-01-11 DIAGNOSIS — R262 Difficulty in walking, not elsewhere classified: Secondary | ICD-10-CM

## 2021-01-11 MED ORDER — OMEPRAZOLE 40 MG PO CPDR: 40.0000 mg | DELAYED_RELEASE_CAPSULE | Freq: Every day | ORAL | 0 refills | Status: DC

## 2021-01-11 NOTE — Progress Notes (Signed)
[place]  Virtual Visit   I connected with Denise Morales, on 01/11/2021 at 2:31 PM through an audio and video application verified that I am speaking with the correct person using two identifiers.   Consent: I discussed the limitations, risks, security and privacy concerns of performing an evaluation and management service by telephone and the availability of in person appointments. I also discussed with the patient that there may be a patient responsible charge related to this service. The patient expressed understanding and agreed to proceed.   Location of Patient: Home   Location of Provider: [facility]   Persons participating in Telemedicine visit: Domingo Madeira,  NP [place] , CMA  History of Present Illness: [name] is a 60 year old female just had THR and start PT today a little drained but feeling good. She does have GERD/heart burn asked for a refill on omeprazole.    Past Medical History:  Diagnosis Date   Depression    GERD (gastroesophageal reflux disease)    Hypertension    No Known Allergies  Current Outpatient Medications on File Prior to Visit  Medication Sig Dispense Refill   aspirin 81 MG chewable tablet Chew 1 tablet (81 mg total) by mouth 2 (two) times daily. 35 tablet 0   baclofen (LIORESAL) 10 MG tablet Take 1 tablet (10 mg total) by mouth 3 (three) times daily as needed for muscle spasms. 1 (ONE) TABLET BY MOUTH THREE TIMES DAILY AS NEEDED FOR MUSCLE SPASMS 60 tablet 0   escitalopram (LEXAPRO) 20 MG tablet Take 1 tablet (20 mg total) by mouth daily. 90 tablet 1   lisinopril-hydrochlorothiazide (ZESTORETIC) 10-12.5 MG tablet Take 1 tablet by mouth daily. 90 tablet 1   omeprazole (PRILOSEC) 40 MG capsule Take 1 capsule (40 mg total) by mouth daily. 90 capsule 1   oxyCODONE (OXY IR/ROXICODONE) 5 MG immediate release tablet Take 1 tablet (5 mg  total) by mouth every 6 (six) hours as needed for moderate pain (pain score 4-6). 30 tablet 0   pravastatin (PRAVACHOL) 40 MG tablet Take 1 tablet (40 mg total) by mouth daily. 90 tablet 1   Vitamin D-Vitamin K (VITAMIN K2-VITAMIN D3 PO) Take 1 tablet by mouth daily.     No current facility-administered medications on file prior to visit.    Observations/Objective: There were no vitals taken for this visit.  Review of Systems  Gastrointestinal:  Positive for heartburn.  All other systems reviewed and are negative.   Assessment and Plan: Diagnoses and all orders for this visit:  Gastroesophageal reflux disease without esophagitis Burning and pain even with water explained PPI is to be used as needed not all the time- discussed the risk. Discussed eating small frequent meal, reduction in acidic foods, fried foods ,spicy foods, alcohol caffeine and tobacco and certain medications. Avoid laying down after eating 11mins-1hour, elevated head of the bed.  -     omeprazole (PRILOSEC) 40 MG capsule; Take 1 capsule (40 mg total) by mouth daily.    Follow Up Instructions: Appt in Dec   I discussed the assessment and treatment plan with the patient. The patient was provided an opportunity to ask questions and all were answered. The patient agreed with the plan and demonstrated an understanding of the instructions.   The patient was advised to call back or seek an in-person evaluation if the symptoms worsen or if the condition fails to improve as anticipated.  I provided 15 minutes total of -face-to-face time during this encounter including median intraservice time, reviewing previous notes, investigations, ordering medications, medical decision making, coordinating care and patient verbalized understanding at the end of the visit.    This note has been created with Surveyor, quantity. Any transcriptional errors are unintentional.   Kerin Perna, NP 01/11/2021, 2:31 PM

## 2021-01-11 NOTE — Therapy (Signed)
Denise [place]ales, [place], [address] Phone: [phone]   Fax:  [phone]  Physical Therapy Evaluation  Patient Details  Name: Denise Morales MRN: [id number] Date of Birth: 06-Sep-1960 Referring Provider (PT): Mcarthur Rossetti, MD   Encounter Date: 01/11/2021   PT End of Session - 01/11/21 1133     Visit Number 1    Number of Visits 17    Date for PT Re-Evaluation 03/12/21    Authorization Type [employer]    Authorization Time Period requesting auth    PT Start Time 1100    PT Stop Time 1139    PT Time Calculation (min) 39 min    Activity Tolerance Patient tolerated treatment well    Behavior During Therapy WFL for tasks assessed/performed             Past Medical History:  Diagnosis Date   Depression    GERD (gastroesophageal reflux disease)    Hypertension     Past Surgical History:  Procedure Laterality Date   ABDOMINAL HYSTERECTOMY  1985   CHOLECYSTECTOMY     SHOULDER ARTHROSCOPY WITH ROTATOR CUFF REPAIR AND SUBACROMIAL DECOMPRESSION Right 08/12/2020   Procedure: RIGHT SHOULDER ARTHROSCOPY WITH ROTATOR CUFF REPAIR AND SUBACROMIAL DECOMPRESSION;  Surgeon: Mcarthur Rossetti, MD;  Location: [facility];  Service: Orthopedics;  Laterality: Right;   TOTAL KNEE ARTHROPLASTY Right 12/24/2020   Procedure: RIGHT TOTAL KNEE ARTHROPLASTY;  Surgeon: Mcarthur Rossetti, MD;  Location: [facility];  Service: Orthopedics;  Laterality: Right;    There were no vitals filed for this visit.    Subjective Assessment - 01/11/21 1103     Subjective Patient reports things have been going good since Rt TKA on 12/24/20. She is able to manage the pain with tylenol. She is using a cane at home and walker for community ambulation. Prior to the surgery she was using a cane. She is also having Lt knee pain and plans for a TKA at some point on this side as well. No pain currently. Pain at  worst 3/10 with movement. She received home health PT and is still completing the prescribed exercises.    Pertinent History 08/12/20 Rt RCR    Limitations Standing;Walking;House hold activities    How long can you sit comfortably? I can sit    How long can you stand comfortably? maybe 5-10 minutes    How long can you walk comfortably? less than 5 minutes    Patient Stated Goals To get away from the cane and be normal again, work in my yard    Currently in Pain? No/denies                [facility] PT Assessment - 01/11/21 0001       Assessment   Medical Diagnosis Z96.651 (ICD-10-CM) - Status post right knee replacement    Referring Provider (PT) Mcarthur Rossetti, MD    Onset Date/Surgical Date 12/24/20    Hand Dominance Right    Next MD Visit 02/03/21    Prior Therapy home health PT following surgery, PT for RCR      Precautions   Precautions None      Restrictions   Weight Bearing Restrictions No      Balance Screen   Has the patient fallen in the past 6 months Yes    How many times? 1   tripped outside   Has the patient had a decrease in activity level because  of a fear of falling?  Yes    Is the patient reluctant to leave their home because of a fear of falling?  No      Home Environment   Living Environment Private residence    Living Arrangements Children    Type of [place]    Additional Comments stairs to enter (handrails on both sides)   currently staying elsewhere due to inability to negotiate stairs at her home     Prior Function   Level of Independence Independent    Vocation Retired    Leisure gardening, reading, walking      Cognition   Overall Cognitive Status Within Functional Limits for tasks assessed      Observation/Other Assessments   Observations mild swelling about Rt knee    Skin Integrity steri strips intact througout most of incision site, proximal incision site is raised, though no redness or drainage present    Focus on Therapeutic  Outcomes (FOTO)  N/A MCD      Sensation   Light Touch Not tested      Coordination   Gross Motor Movements are Fluid and Coordinated Yes      AROM   Right Knee Extension --   lacking 2 degrees   Right Knee Flexion 85    Left Knee Extension --   WNL   Left Knee Flexion 128      Strength   Overall Strength Comments knee MMT deferred due to post-op acuity    Right Hip Flexion 4/5    Left Hip Flexion 4/5      Transfers   Five time sit to stand comments  19.7      Ambulation/Gait   Ambulation/Gait Yes    Assistive device Rolling walker    Gait Comments excessive lateral trunk flexion, limited push off RLE, limited stance time RLE      Balance   Balance Assessed Yes      Static Standing Balance   Static Standing - Comment/# of Minutes 2 seconds SLS on RLE, 20 seconds SLS on LLE      Standardized Balance Assessment   Standardized Balance Assessment Timed Up and Go Test      Timed Up and Go Test   Normal TUG (seconds) 24                        Objective measurements completed on examination: See above findings.       [facility] Adult PT Treatment/Exercise - 01/11/21 0001       Self-Care   Self-Care Other Self-Care Comments    Other Self-Care Comments  see patient education                    PT Education - 01/11/21 1454     Education Details Education on current condition, POC, HEP, signs/symptoms of infection, signs/symptoms of DVT, modalities for pain control    Person(s) Educated Patient    Methods Explanation;Demonstration;Verbal cues;Handout    Comprehension Verbalized understanding;Returned demonstration;Verbal cues required              PT Short Term Goals - 01/11/21 1514       PT SHORT TERM GOAL #1   Title Patient will be independent with initial HEP.    Baseline issued at eval    Time 2    Period Weeks    Status New    Target Date 01/25/21      PT SHORT  TERM GOAL #2   Title Patient will demonstrate at least 100  degrees of Rt knee flexion AROM to improve ability to complete sit to stand.    Baseline 85    Time 4    Period Weeks    Status New    Target Date 02/08/21      PT SHORT TERM GOAL #3   Title Patient will demonstrate full Rt knee extension to improve gait pattern.    Baseline lacking 2 degrees    Time 4    Period Weeks    Status New    Target Date 02/08/21      PT SHORT TERM GOAL #4   Title Patient will be able to ascend/descend stairs with step to pattern and use of handrails in order to return to her home.    Baseline unable to complete stair negotation currently.    Time 4    Period Weeks    Status New    Target Date 02/08/21               PT Long Term Goals - 01/11/21 1516       PT LONG TERM GOAL #1   Title Patient will demonstrate at least 4+/5 Rt knee strength to improve stability necessary for prolonged walking activity.    Baseline n/a due to post-op acuity    Time 8    Period Weeks    Status New    Target Date 03/08/21      PT LONG TERM GOAL #2   Title Patient will demonstrate at least 120 degrees of Rt knee flexion AROM in order to improve ability to complete squatting/bending activity necessary for gardening.    Baseline 85    Time 8    Period Weeks    Status New    Target Date 03/08/21      PT LONG TERM GOAL #3   Title Patient will demonstrate normalized gait mechanics with LRAD.    Baseline antalgic gait with RW    Time 8    Period Weeks    Status New    Target Date 03/08/21      PT LONG TERM GOAL #4   Title Patient will complete 5 x STS in less than 12 seconds to signify improvements in overall functional strength.    Baseline 19.7    Time 8    Period Weeks    Status New    Target Date 03/08/21      PT LONG TERM GOAL #5   Title Patient will complete TUG in less than 15 seconds to decrease her risk of falls.    Baseline 24    Time 8    Period Weeks    Status New    Target Date 03/08/21      Additional Long Term Goals   Additional  Long Term Goals Yes      PT LONG TERM GOAL #6   Title Patient will maintain Rt SLS for at least 10 seconds to improve her overall stability when walking on uneven terrain.    Baseline 2 seconds    Time 8    Period Weeks    Status New    Target Date 03/08/21                    Plan - 01/11/21 1456     Clinical Impression Statement Patient is a 60 y/o female who presents to OPPT s/p Rt TKA on 12/24/20. She  currently presents with weakness, ROM deficits, gait abnormalities and balance deficits that are consistent with her recent post-op status. In addition to her recent post operative status she has a history of falls as well as a fear of falling and upon assessment she does score at increased fall risk based upon her TUG and 5 x STS test today. She will benefit from skilled PT to address the above stated deficits in order to optimize her function and reduce her risk of future falls.    Personal Factors and Comorbidities Age    Examination-Activity Limitations Stairs;Stand;Locomotion Level;Bend;Lift    Examination-Participation Restrictions Driving;Cleaning;Yard Work;Shop    Stability/Clinical Decision Making Stable/Uncomplicated    Clinical Decision Making Low    Rehab Potential Excellent    PT Frequency 2x / week    PT Duration 8 weeks    PT Treatment/Interventions ADLs/Self Care Home Management;Cryotherapy;Moist Heat;Gait training;Stair training;Therapeutic activities;Therapeutic exercise;Balance training;Neuromuscular re-education;Patient/family education;Manual techniques;Passive range of motion;Dry needling;Taping    PT Next Visit Plan manual to Rt knee, review HEP, quad strengthening, gait training    PT Home Exercise Plan Access Code [email or web address]    Consulted and Agree with Plan of Care Patient             Patient will benefit from skilled therapeutic intervention in order to improve the following deficits and impairments:  Abnormal gait, Decreased range of motion,  Difficulty walking, Decreased activity tolerance, Pain, Decreased balance, Decreased strength  Visit Diagnosis: Acute pain of right knee  Stiffness of right knee, not elsewhere classified  Muscle weakness (generalized)  Difficulty in walking, not elsewhere classified     Problem List Patient Active Problem List   Diagnosis Date Noted   Status post right knee replacement 12/24/2020   Unilateral primary osteoarthritis, left knee 11/11/2020   Unilateral primary osteoarthritis, right knee 11/11/2020   Complete tear of right rotator cuff 08/12/2020   Hypertensive disorder 05/05/2019   Check all possible CPT codes: 97110- Therapeutic Exercise, [phone]- Neuro Re-education, [phone] - Gait Training, [phone] - Manual Therapy, 97530 - Therapeutic Activities, and 97535 - [place] Sylar Voong, PT, DPT, ATC 01/11/21 4:27 PM  [place] [facility] [address] [place], [place], [address] Phone: [phone]   Fax:  [phone]  Name: Jamira Barfuss MRN: [id number] Date of Birth: [birth year]

## 2021-01-17 ENCOUNTER — Other Ambulatory Visit: Payer: Self-pay

## 2021-01-17 ENCOUNTER — Ambulatory Visit: Payer: Medicaid Other

## 2021-01-17 DIAGNOSIS — M25661 Stiffness of right knee, not elsewhere classified: Secondary | ICD-10-CM

## 2021-01-17 DIAGNOSIS — M6281 Muscle weakness (generalized): Secondary | ICD-10-CM

## 2021-01-17 DIAGNOSIS — M25561 Pain in right knee: Secondary | ICD-10-CM | POA: Diagnosis not present

## 2021-01-17 DIAGNOSIS — R262 Difficulty in walking, not elsewhere classified: Secondary | ICD-10-CM

## 2021-01-17 NOTE — Therapy (Signed)
[place], [place], [address] Phone: [phone]   Fax:  [phone]  Physical Therapy Treatment  Patient Details  Name: Denise Morales MRN: [id number] Date of Birth: 28-Dec-1960 Referring Provider (PT): Mcarthur Rossetti, MD   Encounter Date: 01/17/2021   PT End of Session - 01/17/21 1052     Visit Number 2    Number of Visits 17    Date for PT Re-Evaluation 03/12/21    Authorization Type [employer]    Authorization Time Period auth pending    PT Start Time 1100    PT Stop Time 1142    PT Time Calculation (min) 42 min    Activity Tolerance Patient tolerated treatment well    Behavior During Therapy WFL for tasks assessed/performed             Past Medical History:  Diagnosis Date   Depression    GERD (gastroesophageal reflux disease)    Hypertension     Past Surgical History:  Procedure Laterality Date   ABDOMINAL HYSTERECTOMY  1985   CHOLECYSTECTOMY     SHOULDER ARTHROSCOPY WITH ROTATOR CUFF REPAIR AND SUBACROMIAL DECOMPRESSION Right 08/12/2020   Procedure: RIGHT SHOULDER ARTHROSCOPY WITH ROTATOR CUFF REPAIR AND SUBACROMIAL DECOMPRESSION;  Surgeon: Mcarthur Rossetti, MD;  Location: [facility];  Service: Orthopedics;  Laterality: Right;   TOTAL KNEE ARTHROPLASTY Right 12/24/2020   Procedure: RIGHT TOTAL KNEE ARTHROPLASTY;  Surgeon: Mcarthur Rossetti, MD;  Location: [facility];  Service: Orthopedics;  Laterality: Right;    There were no vitals filed for this visit.   Subjective Assessment - 01/17/21 1101     Subjective Patient reports things are going well. Sleeping through the night now. Was able to go to the farmers market and walk around this weekend. She reports compliance with HEP.    Pertinent History 08/12/20 Rt RCR    Limitations Standing;Walking;House hold activities    How long can you sit comfortably? I can sit    How long can you stand  comfortably? maybe 5-10 minutes    How long can you walk comfortably? less than 5 minutes    Patient Stated Goals To get away from the cane and be normal again, work in my yard    Currently in Pain? No/denies                [facility] PT Assessment - 01/17/21 0001       AROM   Right Knee Flexion 89                           [facility] Adult PT Treatment/Exercise - 01/17/21 0001       Knee/Hip Exercises: Stretches   Gastroc Stretch 30 seconds    Gastroc Stretch Limitations x2; with strap RLE    Other Knee/Hip Stretches heel slides 1 x 10 Rt      Knee/Hip Exercises: Aerobic   Nustep 6 minutes level 4; UE/LE      Knee/Hip Exercises: Standing   Heel Raises 10 reps    Heel Raises Limitations x2      Knee/Hip Exercises: Seated   Long Arc Quad 10 reps    Long Arc Quad Limitations x2; RLE      Knee/Hip Exercises: Supine   Straight Leg Raises 5 reps    Straight Leg Raises Limitations x2; partial range      Manual Therapy   Manual Therapy Passive  ROM;Joint mobilization    Joint Mobilization patellar mobilizations all planes    Passive ROM knee flexion/extension to tolerance Rt                    PT Education - 01/17/21 1142     Education Details Updated HEP.    Person(s) Educated Patient    Methods Explanation;Demonstration;Verbal cues;Handout    Comprehension Verbalized understanding;Returned demonstration;Verbal cues required              PT Short Term Goals - 01/11/21 1514       PT SHORT TERM GOAL #1   Title Patient will be independent with initial HEP.    Baseline issued at eval    Time 2    Period Weeks    Status New    Target Date 01/25/21      PT SHORT TERM GOAL #2   Title Patient will demonstrate at least 100 degrees of Rt knee flexion AROM to improve ability to complete sit to stand.    Baseline 85    Time 4    Period Weeks    Status New    Target Date 02/08/21      PT SHORT TERM GOAL #3   Title Patient will demonstrate  full Rt knee extension to improve gait pattern.    Baseline lacking 2 degrees    Time 4    Period Weeks    Status New    Target Date 02/08/21      PT SHORT TERM GOAL #4   Title Patient will be able to ascend/descend stairs with step to pattern and use of handrails in order to return to her home.    Baseline unable to complete stair negotation currently.    Time 4    Period Weeks    Status New    Target Date 02/08/21               PT Long Term Goals - 01/11/21 1516       PT LONG TERM GOAL #1   Title Patient will demonstrate at least 4+/5 Rt knee strength to improve stability necessary for prolonged walking activity.    Baseline n/a due to post-op acuity    Time 8    Period Weeks    Status New    Target Date 03/08/21      PT LONG TERM GOAL #2   Title Patient will demonstrate at least 120 degrees of Rt knee flexion AROM in order to improve ability to complete squatting/bending activity necessary for gardening.    Baseline 85    Time 8    Period Weeks    Status New    Target Date 03/08/21      PT LONG TERM GOAL #3   Title Patient will demonstrate normalized gait mechanics with LRAD.    Baseline antalgic gait with RW    Time 8    Period Weeks    Status New    Target Date 03/08/21      PT LONG TERM GOAL #4   Title Patient will complete 5 x STS in less than 12 seconds to signify improvements in overall functional strength.    Baseline 19.7    Time 8    Period Weeks    Status New    Target Date 03/08/21      PT LONG TERM GOAL #5   Title Patient will complete TUG in less than 15 seconds to decrease her risk of  falls.    Baseline 24    Time 8    Period Weeks    Status New    Target Date 03/08/21      Additional Long Term Goals   Additional Long Term Goals Yes      PT LONG TERM GOAL #6   Title Patient will maintain Rt SLS for at least 10 seconds to improve her overall stability when walking on uneven terrain.    Baseline 2 seconds    Time 8    Period Weeks     Status New    Target Date 03/08/21                   Plan - 01/17/21 1121     Clinical Impression Statement Patient arrives without reports of pain and tolerated session well today. Rt knee flexion AROM is gradually improving and she was able to complete partial range of SLR without quad lag today. No reports of pain throughout session.    Personal Factors and Comorbidities Age    Examination-Activity Limitations Stairs;Stand;Locomotion Level;Bend;Lift    Examination-Participation Restrictions Driving;Cleaning;Yard Work;Shop    Stability/Clinical Decision Making Stable/Uncomplicated    Rehab Potential Excellent    PT Frequency 2x / week    PT Duration 8 weeks    PT Treatment/Interventions ADLs/Self Care Home Management;Cryotherapy;Moist Heat;Gait training;Stair training;Therapeutic activities;Therapeutic exercise;Balance training;Neuromuscular re-education;Patient/family education;Manual techniques;Passive range of motion;Dry needling;Taping    PT Next Visit Plan quad strengthening, gait training, static balance    PT Home Exercise Plan Access Code [email or web address]    Consulted and Agree with Plan of Care Patient             Patient will benefit from skilled therapeutic intervention in order to improve the following deficits and impairments:  Abnormal gait, Decreased range of motion, Difficulty walking, Decreased activity tolerance, Pain, Decreased balance, Decreased strength  Visit Diagnosis: Acute pain of right knee  Stiffness of right knee, not elsewhere classified  Muscle weakness (generalized)  Difficulty in walking, not elsewhere classified     Problem List Patient Active Problem List   Diagnosis Date Noted   Status post right knee replacement 12/24/2020   Unilateral primary osteoarthritis, left knee 11/11/2020   Unilateral primary osteoarthritis, right knee 11/11/2020   Complete tear of right rotator cuff 08/12/2020   Hypertensive disorder 05/05/2019    Gwendolyn Grant, PT, DPT, ATC 01/17/21 11:45 AM   [place] [address] [place], [place], [address] Phone: [phone]   Fax:  [phone]  Name: Denise Morales MRN: [id number] Date of Birth: 21-Nov-1960

## 2021-01-19 ENCOUNTER — Ambulatory Visit: Payer: Medicaid Other

## 2021-01-19 ENCOUNTER — Other Ambulatory Visit: Payer: Self-pay

## 2021-01-19 DIAGNOSIS — R262 Difficulty in walking, not elsewhere classified: Secondary | ICD-10-CM

## 2021-01-19 DIAGNOSIS — M25561 Pain in right knee: Secondary | ICD-10-CM

## 2021-01-19 DIAGNOSIS — M6281 Muscle weakness (generalized): Secondary | ICD-10-CM

## 2021-01-19 DIAGNOSIS — M25661 Stiffness of right knee, not elsewhere classified: Secondary | ICD-10-CM

## 2021-01-19 NOTE — Therapy (Signed)
[place], [place], [address] Phone: [phone]   Fax:  [phone]  Physical Therapy Treatment  Patient Details  Name: Denise Morales MRN: [id number] Date of Birth: 12/05/1960 Referring Provider (PT): Mcarthur Rossetti, MD   Encounter Date: 01/19/2021   PT End of Session - 01/19/21 1053     Visit Number 3    Number of Visits 17    Date for PT Re-Evaluation 03/12/21    Authorization Type [employer]    Authorization Time Period auth pending    PT Start Time 1054    PT Stop Time 1137    PT Time Calculation (min) 43 min    Activity Tolerance Patient tolerated treatment well    Behavior During Therapy WFL for tasks assessed/performed             Past Medical History:  Diagnosis Date   Depression    GERD (gastroesophageal reflux disease)    Hypertension     Past Surgical History:  Procedure Laterality Date   ABDOMINAL HYSTERECTOMY  1985   CHOLECYSTECTOMY     SHOULDER ARTHROSCOPY WITH ROTATOR CUFF REPAIR AND SUBACROMIAL DECOMPRESSION Right 08/12/2020   Procedure: RIGHT SHOULDER ARTHROSCOPY WITH ROTATOR CUFF REPAIR AND SUBACROMIAL DECOMPRESSION;  Surgeon: Mcarthur Rossetti, MD;  Location: [facility];  Service: Orthopedics;  Laterality: Right;   TOTAL KNEE ARTHROPLASTY Right 12/24/2020   Procedure: RIGHT TOTAL KNEE ARTHROPLASTY;  Surgeon: Mcarthur Rossetti, MD;  Location: [facility];  Service: Orthopedics;  Laterality: Right;    There were no vitals filed for this visit.   Subjective Assessment - 01/19/21 1057     Subjective Patient reports the knee is feeling pretty good without pain currently. Bathing is becoming easier, taking less time. She reports compliance with HEP.    Pertinent History 08/12/20 Rt RCR    Limitations Standing;Walking;House hold activities    How long can you sit comfortably? I can sit    How long can you stand comfortably? maybe 5-10  minutes    How long can you walk comfortably? less than 5 minutes    Patient Stated Goals To get away from the cane and be normal again, work in my yard    Currently in Pain? No/denies                               [facility] Adult PT Treatment/Exercise - 01/19/21 0001       Ambulation/Gait   Stairs Yes    Stairs Assistance 6: Modified independent (Device/Increase time)    Stair Management Technique Two rails;Step to pattern    Gait Comments multiple trials on 6 and 8 inch steps utilizing bilateral hand rails and single handrail with SPC      Knee/Hip Exercises: Stretches   Passive Hamstring Stretch 30 seconds    Passive Hamstring Stretch Limitations x2; Rt    Other Knee/Hip Stretches heel slides 1 x 10 Rt      Knee/Hip Exercises: Aerobic   Nustep 5 minutes level 4 UE/LE      Knee/Hip Exercises: Standing   Terminal Knee Extension 10 reps    Terminal Knee Extension Limitations with ball x2; 5 sec hold      Knee/Hip Exercises: Supine   Straight Leg Raises Limitations 2x8; full range      Knee/Hip Exercises: Sidelying   Hip ABduction 10 reps    Hip ABduction Limitations x2  Manual Therapy   Joint Mobilization patellar mobilizations all planes    Passive ROM knee flexion/extension to tolerance Rt                      PT Short Term Goals - 01/11/21 1514       PT SHORT TERM GOAL #1   Title Patient will be independent with initial HEP.    Baseline issued at eval    Time 2    Period Weeks    Status New    Target Date 01/25/21      PT SHORT TERM GOAL #2   Title Patient will demonstrate at least 100 degrees of Rt knee flexion AROM to improve ability to complete sit to stand.    Baseline 85    Time 4    Period Weeks    Status New    Target Date 02/08/21      PT SHORT TERM GOAL #3   Title Patient will demonstrate full Rt knee extension to improve gait pattern.    Baseline lacking 2 degrees    Time 4    Period Weeks    Status New     Target Date 02/08/21      PT SHORT TERM GOAL #4   Title Patient will be able to ascend/descend stairs with step to pattern and use of handrails in order to return to her home.    Baseline unable to complete stair negotation currently.    Time 4    Period Weeks    Status New    Target Date 02/08/21               PT Long Term Goals - 01/11/21 1516       PT LONG TERM GOAL #1   Title Patient will demonstrate at least 4+/5 Rt knee strength to improve stability necessary for prolonged walking activity.    Baseline n/a due to post-op acuity    Time 8    Period Weeks    Status New    Target Date 03/08/21      PT LONG TERM GOAL #2   Title Patient will demonstrate at least 120 degrees of Rt knee flexion AROM in order to improve ability to complete squatting/bending activity necessary for gardening.    Baseline 85    Time 8    Period Weeks    Status New    Target Date 03/08/21      PT LONG TERM GOAL #3   Title Patient will demonstrate normalized gait mechanics with LRAD.    Baseline antalgic gait with RW    Time 8    Period Weeks    Status New    Target Date 03/08/21      PT LONG TERM GOAL #4   Title Patient will complete 5 x STS in less than 12 seconds to signify improvements in overall functional strength.    Baseline 19.7    Time 8    Period Weeks    Status New    Target Date 03/08/21      PT LONG TERM GOAL #5   Title Patient will complete TUG in less than 15 seconds to decrease her risk of falls.    Baseline 24    Time 8    Period Weeks    Status New    Target Date 03/08/21      Additional Long Term Goals   Additional Long Term Goals Yes  PT LONG TERM GOAL #6   Title Patient will maintain Rt SLS for at least 10 seconds to improve her overall stability when walking on uneven terrain.    Baseline 2 seconds    Time 8    Period Weeks    Status New    Target Date 03/08/21                   Plan - 01/19/21 1117     Clinical Impression  Statement Able to complete stair training today with and without SPC. She demonstrates good stability and control utilizing step to pattern without reports of pain. She is able to complete full range SLR without quad lag. Minor pain reported along medial aspect of anterior knee with TKE, otherwise no complaints of pain.    Personal Factors and Comorbidities Age    Examination-Activity Limitations Stairs;Stand;Locomotion Level;Bend;Lift    Examination-Participation Restrictions Driving;Cleaning;Yard Work;Shop    Stability/Clinical Decision Making Stable/Uncomplicated    Rehab Potential Excellent    PT Frequency 2x / week    PT Duration 8 weeks    PT Treatment/Interventions ADLs/Self Care Home Management;Cryotherapy;Moist Heat;Gait training;Stair training;Therapeutic activities;Therapeutic exercise;Balance training;Neuromuscular re-education;Patient/family education;Manual techniques;Passive range of motion;Dry needling;Taping    PT Next Visit Plan quad strengthening, gait training, static balance    PT Home Exercise Plan Access Code [email or web address]    Consulted and Agree with Plan of Care Patient             Patient will benefit from skilled therapeutic intervention in order to improve the following deficits and impairments:  Abnormal gait, Decreased range of motion, Difficulty walking, Decreased activity tolerance, Pain, Decreased balance, Decreased strength  Visit Diagnosis: Acute pain of right knee  Stiffness of right knee, not elsewhere classified  Muscle weakness (generalized)  Difficulty in walking, not elsewhere classified     Problem List Patient Active Problem List   Diagnosis Date Noted   Status post right knee replacement 12/24/2020   Unilateral primary osteoarthritis, left knee 11/11/2020   Unilateral primary osteoarthritis, right knee 11/11/2020   Complete tear of right rotator cuff 08/12/2020   Hypertensive disorder 05/05/2019   Gwendolyn Grant, PT, DPT, ATC 01/19/21  11:39 AM   [facility] [address] [place], [place], [address] Phone: [phone]   Fax:  [phone]  Name: Denise Morales MRN: [id number] Date of Birth: [birth year]

## 2021-01-25 ENCOUNTER — Ambulatory Visit: Payer: Medicaid Other | Attending: Orthopaedic Surgery

## 2021-01-25 ENCOUNTER — Telehealth: Payer: Medicaid Other | Admitting: Family Medicine

## 2021-01-25 ENCOUNTER — Other Ambulatory Visit: Payer: Self-pay

## 2021-01-25 DIAGNOSIS — R262 Difficulty in walking, not elsewhere classified: Secondary | ICD-10-CM | POA: Insufficient documentation

## 2021-01-25 DIAGNOSIS — M6281 Muscle weakness (generalized): Secondary | ICD-10-CM | POA: Diagnosis not present

## 2021-01-25 DIAGNOSIS — M25561 Pain in right knee: Secondary | ICD-10-CM | POA: Diagnosis not present

## 2021-01-25 DIAGNOSIS — M25661 Stiffness of right knee, not elsewhere classified: Secondary | ICD-10-CM | POA: Insufficient documentation

## 2021-01-25 NOTE — Therapy (Signed)
[place], [place], [address] Phone: [phone]   Fax:  [phone]  Physical Therapy Treatment  Patient Details  Name: Denise Morales MRN: [id number] Date of Birth: 1960-09-09 Referring Provider (PT): Mcarthur Rossetti, MD   Encounter Date: 01/25/2021   PT End of Session - 01/25/21 1014     Visit Number 4    Number of Visits 17    Date for PT Re-Evaluation 03/12/21    Authorization Type [employer]    Authorization Time Period 7/25-9/17/22    Authorization - Visit Number 3    Authorization - Number of Visits 16    PT Start Time 4970    PT Stop Time 1057    PT Time Calculation (min) 42 min    Activity Tolerance Patient tolerated treatment well    Behavior During Therapy WFL for tasks assessed/performed             Past Medical History:  Diagnosis Date   Depression    GERD (gastroesophageal reflux disease)    Hypertension     Past Surgical History:  Procedure Laterality Date   ABDOMINAL HYSTERECTOMY  1985   CHOLECYSTECTOMY     SHOULDER ARTHROSCOPY WITH ROTATOR CUFF REPAIR AND SUBACROMIAL DECOMPRESSION Right 08/12/2020   Procedure: RIGHT SHOULDER ARTHROSCOPY WITH ROTATOR CUFF REPAIR AND SUBACROMIAL DECOMPRESSION;  Surgeon: Mcarthur Rossetti, MD;  Location: [facility];  Service: Orthopedics;  Laterality: Right;   TOTAL KNEE ARTHROPLASTY Right 12/24/2020   Procedure: RIGHT TOTAL KNEE ARTHROPLASTY;  Surgeon: Mcarthur Rossetti, MD;  Location: [facility];  Service: Orthopedics;  Laterality: Right;    There were no vitals filed for this visit.   Subjective Assessment - 01/25/21 1017     Subjective Patient reports the knee is feeling good. She was able to tried going up and down the stairs at her daughter's house and did well with it, so is planning to move back soon. She reports compliance with HEP.    Pertinent History 08/12/20 Rt RCR    Limitations  Standing;Walking;House hold activities    How long can you sit comfortably? I can sit    How long can you stand comfortably? maybe 5-10 minutes    How long can you walk comfortably? less than 5 minutes    Patient Stated Goals To get away from the cane and be normal again, work in my yard    Currently in Pain? No/denies                [facility] PT Assessment - 01/25/21 0001       Observation/Other Assessments   Skin Integrity 1 stitch present along proximal aspect of incision site, otherwise incision site is healing well.      AROM   Right Knee Extension 0    Right Knee Flexion 103                           [facility] Adult PT Treatment/Exercise - 01/25/21 0001       Neuro Re-ed    Neuro Re-ed Details  3 x 30 sec tandem bilaterally, romberg on foam 1 x 30 sec, romberg on foam eyes closed 2  x30 sec      Knee/Hip Exercises: Stretches   Gastroc Stretch 60 seconds    Gastroc Stretch Limitations wedge      Knee/Hip Exercises: Standing   Heel Raises 15 reps    Heel  Raises Limitations x2    Other Standing Knee Exercises marching 2  x20      Knee/Hip Exercises: Seated   Long Arc Quad 10 reps    Long Arc Quad Limitations x2; bilaterally      Knee/Hip Exercises: Supine   Bridges 10 reps    Bridges Limitations x2      Knee/Hip Exercises: Sidelying   Clams 2 x 10; blue band RLE      Manual Therapy   Joint Mobilization patellar mobilizations all planes    Passive ROM knee flexion/extension to tolerance Rt                      PT Short Term Goals - 01/25/21 1031       PT SHORT TERM GOAL #1   Title Patient will be independent with initial HEP.    Time 2    Period Weeks    Status Achieved    Target Date 01/25/21      PT SHORT TERM GOAL #2   Title Patient will demonstrate at least 100 degrees of Rt knee flexion AROM to improve ability to complete sit to stand.    Baseline 103 8/2    Time 4    Period Weeks    Status Achieved    Target Date  02/08/21      PT SHORT TERM GOAL #3   Title Patient will demonstrate full Rt knee extension to improve gait pattern.    Baseline 0 8/2    Time 4    Period Weeks    Status Achieved    Target Date 02/08/21      PT SHORT TERM GOAL #4   Title Patient will be able to ascend/descend stairs with step to pattern and use of handrails in order to return to her home.    Baseline plans to return home soon    Time 4    Period Weeks    Status On-going    Target Date 02/08/21               PT Long Term Goals - 01/11/21 1516       PT LONG TERM GOAL #1   Title Patient will demonstrate at least 4+/5 Rt knee strength to improve stability necessary for prolonged walking activity.    Baseline n/a due to post-op acuity    Time 8    Period Weeks    Status New    Target Date 03/08/21      PT LONG TERM GOAL #2   Title Patient will demonstrate at least 120 degrees of Rt knee flexion AROM in order to improve ability to complete squatting/bending activity necessary for gardening.    Baseline 85    Time 8    Period Weeks    Status New    Target Date 03/08/21      PT LONG TERM GOAL #3   Title Patient will demonstrate normalized gait mechanics with LRAD.    Baseline antalgic gait with RW    Time 8    Period Weeks    Status New    Target Date 03/08/21      PT LONG TERM GOAL #4   Title Patient will complete 5 x STS in less than 12 seconds to signify improvements in overall functional strength.    Baseline 19.7    Time 8    Period Weeks    Status New    Target Date 03/08/21  PT LONG TERM GOAL #5   Title Patient will complete TUG in less than 15 seconds to decrease her risk of falls.    Baseline 24    Time 8    Period Weeks    Status New    Target Date 03/08/21      Additional Long Term Goals   Additional Long Term Goals Yes      PT LONG TERM GOAL #6   Title Patient will maintain Rt SLS for at least 10 seconds to improve her overall stability when walking on uneven terrain.     Baseline 2 seconds    Time 8    Period Weeks    Status New    Target Date 03/08/21                   Plan - 01/25/21 1035     Clinical Impression Statement Patient is making good functional progress having met 3/4 short term functional goals. Her knee AROM continues to gradually improve obtaining 103 degrees of flexion and 0 degrees extension today. Able to begin static balance training today with patient extremely nervous/fearful to complete, though overall she demonstrates good balance with minimal sway during tandem on level surface and romberg on foam.    Personal Factors and Comorbidities Age    Examination-Activity Limitations Stairs;Stand;Locomotion Level;Bend;Lift    Examination-Participation Restrictions Driving;Cleaning;Yard Work;Shop    Stability/Clinical Decision Making Stable/Uncomplicated    Rehab Potential Excellent    PT Frequency 2x / week    PT Duration 8 weeks    PT Treatment/Interventions ADLs/Self Care Home Management;Cryotherapy;Moist Heat;Gait training;Stair training;Therapeutic activities;Therapeutic exercise;Balance training;Neuromuscular re-education;Patient/family education;Manual techniques;Passive range of motion;Dry needling;Taping    PT Next Visit Plan update HEP, quad strengthening, gait training, static balance    PT Home Exercise Plan Access Code [email or web address]    Consulted and Agree with Plan of Care Patient             Patient will benefit from skilled therapeutic intervention in order to improve the following deficits and impairments:  Abnormal gait, Decreased range of motion, Difficulty walking, Decreased activity tolerance, Pain, Decreased balance, Decreased strength  Visit Diagnosis: Acute pain of right knee  Stiffness of right knee, not elsewhere classified  Muscle weakness (generalized)  Difficulty in walking, not elsewhere classified     Problem List Patient Active Problem List   Diagnosis Date Noted   Status post right  knee replacement 12/24/2020   Unilateral primary osteoarthritis, left knee 11/11/2020   Unilateral primary osteoarthritis, right knee 11/11/2020   Complete tear of right rotator cuff 08/12/2020   Hypertensive disorder 05/05/2019   Gwendolyn Grant, PT, DPT, ATC 01/25/21 11:00 AM   [facility] [address] [place], [place], [address] Phone: [phone]   Fax:  [phone]  Name: Denise Morales MRN: [id number] Date of Birth: Aug 27, 1960

## 2021-01-27 ENCOUNTER — Encounter: Payer: Self-pay | Admitting: Physical Therapy

## 2021-01-27 ENCOUNTER — Ambulatory Visit: Payer: Medicaid Other | Admitting: Physical Therapy

## 2021-01-27 ENCOUNTER — Other Ambulatory Visit: Payer: Self-pay

## 2021-01-27 DIAGNOSIS — M25661 Stiffness of right knee, not elsewhere classified: Secondary | ICD-10-CM | POA: Diagnosis not present

## 2021-01-27 DIAGNOSIS — R262 Difficulty in walking, not elsewhere classified: Secondary | ICD-10-CM

## 2021-01-27 DIAGNOSIS — M25561 Pain in right knee: Secondary | ICD-10-CM

## 2021-01-27 DIAGNOSIS — M6281 Muscle weakness (generalized): Secondary | ICD-10-CM

## 2021-01-27 NOTE — Therapy (Signed)
[place], [place], [address] Phone: [phone]   Fax:  [phone]  Physical Therapy Treatment  Patient Details  Name: Denise Morales MRN: [id number] Date of Birth: 05/13/1961 Referring Provider (PT): Mcarthur Rossetti, MD   Encounter Date: 01/27/2021   PT End of Session - 01/27/21 1003     Visit Number 5    Number of Visits 17    Date for PT Re-Evaluation 03/12/21    Authorization Type [employer]    Authorization Time Period 7/25-9/17/22    Authorization - Visit Number 4    Authorization - Number of Visits 16    PT Start Time 1001    PT Stop Time [id number]    PT Time Calculation (min) 41 min    Activity Tolerance Patient tolerated treatment well    Behavior During Therapy WFL for tasks assessed/performed             Past Medical History:  Diagnosis Date   Depression    GERD (gastroesophageal reflux disease)    Hypertension     Past Surgical History:  Procedure Laterality Date   ABDOMINAL HYSTERECTOMY  1985   CHOLECYSTECTOMY     SHOULDER ARTHROSCOPY WITH ROTATOR CUFF REPAIR AND SUBACROMIAL DECOMPRESSION Right 08/12/2020   Procedure: RIGHT SHOULDER ARTHROSCOPY WITH ROTATOR CUFF REPAIR AND SUBACROMIAL DECOMPRESSION;  Surgeon: Mcarthur Rossetti, MD;  Location: [facility];  Service: Orthopedics;  Laterality: Right;   TOTAL KNEE ARTHROPLASTY Right 12/24/2020   Procedure: RIGHT TOTAL KNEE ARTHROPLASTY;  Surgeon: Mcarthur Rossetti, MD;  Location: [facility];  Service: Orthopedics;  Laterality: Right;    There were no vitals filed for this visit.   Subjective Assessment - 01/27/21 1004     Subjective Patient reports right knee is feeling good but the left one is hurting today. She reports her exercises are going well with no new issues. She notes that she can do the steps and everything but she still has confidence walking without the cane.    Patient Stated Goals To  get away from the cane and be normal again, work in my yard    Currently in Pain? No/denies                               [facility] Adult PT Treatment/Exercise - 01/27/21 0001       Neuro Re-ed    Neuro Re-ed Details  Tandem stance 3 x 30 sec each      Exercises   Exercises Knee/Hip      Knee/Hip Exercises: Aerobic   Nustep L5 x 5 min with UE/LE while taking subjective      Knee/Hip Exercises: Standing   Heel Raises 2 sets;10 reps    Hip Abduction 10 reps    Hip Extension 10 reps      Knee/Hip Exercises: Seated   Long Arc Quad 2 sets;10 reps    Sit to [employer] 2 sets;10 reps      Knee/Hip Exercises: Supine   Heel Slides 10 reps   5 sec hold with strap   Bridges 2 sets;10 reps    Straight Leg Raises 2 sets;10 reps      Knee/Hip Exercises: Sidelying   Hip ABduction 2 sets;10 reps                    PT Education - 01/27/21 1003     Education  Details HEP update, gradual progression off cane    Person(s) Educated Patient    Methods Explanation;Demonstration;Verbal cues;Handout    Comprehension Verbalized understanding;Returned demonstration;Verbal cues required;Need further instruction              PT Short Term Goals - 01/25/21 1031       PT SHORT TERM GOAL #1   Title Patient will be independent with initial HEP.    Time 2    Period Weeks    Status Achieved    Target Date 01/25/21      PT SHORT TERM GOAL #2   Title Patient will demonstrate at least 100 degrees of Rt knee flexion AROM to improve ability to complete sit to stand.    Baseline 103 8/2    Time 4    Period Weeks    Status Achieved    Target Date 02/08/21      PT SHORT TERM GOAL #3   Title Patient will demonstrate full Rt knee extension to improve gait pattern.    Baseline 0 8/2    Time 4    Period Weeks    Status Achieved    Target Date 02/08/21      PT SHORT TERM GOAL #4   Title Patient will be able to ascend/descend stairs with step to pattern and use of  handrails in order to return to her home.    Baseline plans to return home soon    Time 4    Period Weeks    Status On-going    Target Date 02/08/21               PT Long Term Goals - 01/11/21 1516       PT LONG TERM GOAL #1   Title Patient will demonstrate at least 4+/5 Rt knee strength to improve stability necessary for prolonged walking activity.    Baseline n/a due to post-op acuity    Time 8    Period Weeks    Status New    Target Date 03/08/21      PT LONG TERM GOAL #2   Title Patient will demonstrate at least 120 degrees of Rt knee flexion AROM in order to improve ability to complete squatting/bending activity necessary for gardening.    Baseline 85    Time 8    Period Weeks    Status New    Target Date 03/08/21      PT LONG TERM GOAL #3   Title Patient will demonstrate normalized gait mechanics with LRAD.    Baseline antalgic gait with RW    Time 8    Period Weeks    Status New    Target Date 03/08/21      PT LONG TERM GOAL #4   Title Patient will complete 5 x STS in less than 12 seconds to signify improvements in overall functional strength.    Baseline 19.7    Time 8    Period Weeks    Status New    Target Date 03/08/21      PT LONG TERM GOAL #5   Title Patient will complete TUG in less than 15 seconds to decrease her risk of falls.    Baseline 24    Time 8    Period Weeks    Status New    Target Date 03/08/21      Additional Long Term Goals   Additional Long Term Goals Yes      PT LONG TERM GOAL #  6   Title Patient will maintain Rt SLS for at least 10 seconds to improve her overall stability when walking on uneven terrain.    Baseline 2 seconds    Time 8    Period Weeks    Status New    Target Date 03/08/21                   Plan - 01/27/21 1007     Clinical Impression Statement Patient tolerated therapy well with no adverse effects. She is progressing well with her exercises but did report increased left knee pain so limited  with weight bearing exercises this visit. She continues to demonstrate anxiety with balance training, and required occasional touch down support with tandem stance. Update HEP to continue progression of strengthening and including balance training. Patient would benefit from continued skilled PT to progress her mobility, strength, and balance in order to maximize functional level.    PT Treatment/Interventions ADLs/Self Care Home Management;Cryotherapy;Moist Heat;Gait training;Stair training;Therapeutic activities;Therapeutic exercise;Balance training;Neuromuscular re-education;Patient/family education;Manual techniques;Passive range of motion;Dry needling;Taping    PT Next Visit Plan update HEP, quad strengthening, gait training, static balance    PT Home Exercise Plan Access Code [email or web address]    Consulted and Agree with Plan of Care Patient             Patient will benefit from skilled therapeutic intervention in order to improve the following deficits and impairments:  Abnormal gait, Decreased range of motion, Difficulty walking, Decreased activity tolerance, Pain, Decreased balance, Decreased strength  Visit Diagnosis: Acute pain of right knee  Stiffness of right knee, not elsewhere classified  Muscle weakness (generalized)  Difficulty in walking, not elsewhere classified     Problem List Patient Active Problem List   Diagnosis Date Noted   Status post right knee replacement 12/24/2020   Unilateral primary osteoarthritis, left knee 11/11/2020   Unilateral primary osteoarthritis, right knee 11/11/2020   Complete tear of right rotator cuff 08/12/2020   Hypertensive disorder 05/05/2019    Hilda Blades, PT, DPT, LAT, ATC 01/27/21  10:59 AM Phone: [phone] Fax: [place] [facility] [address] [place], [place], [address] Phone: [phone]   Fax:  [phone]  Name: Denise Morales MRN: [id number] Date of  Birth: June 08, 1961

## 2021-01-27 NOTE — Patient Instructions (Signed)
Access Code: [email or web address] URL: [email or web address] Date: 01/27/2021 Prepared by: Hilda Blades  Exercises Supine Heel Slide with Strap - 2 x daily - 7 x weekly - 2 sets - 10 reps - 5 hold Active Straight Leg Raise with Quad Set - 2 x daily - 7 x weekly - 2 sets - 10 reps Supine Bridge - 2 x daily - 7 x weekly - 2 sets - 10 reps Sidelying Hip Abduction - 2 x daily - 7 x weekly - 2 sets - 10 reps Seated Long Arc Quad - 2 x daily - 7 x weekly - 2 sets - 10 reps Sit to Stand Without Arm Support - 2 x daily - 7 x weekly - 2 sets - 10 reps Heel Raises with Counter Support - 2 x daily - 7 x weekly - 2 sets - 10 reps Standing Tandem Balance with Counter Support - 2 x daily - 7 x weekly - 2 reps - 30 hold

## 2021-01-31 ENCOUNTER — Other Ambulatory Visit: Payer: Self-pay

## 2021-01-31 ENCOUNTER — Ambulatory Visit: Payer: Medicaid Other

## 2021-01-31 DIAGNOSIS — M25661 Stiffness of right knee, not elsewhere classified: Secondary | ICD-10-CM | POA: Diagnosis not present

## 2021-01-31 DIAGNOSIS — M25561 Pain in right knee: Secondary | ICD-10-CM

## 2021-01-31 DIAGNOSIS — R262 Difficulty in walking, not elsewhere classified: Secondary | ICD-10-CM | POA: Diagnosis not present

## 2021-01-31 DIAGNOSIS — M6281 Muscle weakness (generalized): Secondary | ICD-10-CM | POA: Diagnosis not present

## 2021-01-31 NOTE — Therapy (Signed)
[place], [place], [address] Phone: [phone]   Fax:  [phone]  Physical Therapy Treatment  Patient Details  Name: Denise Morales MRN: [id number] Date of Birth: 1961/04/30 Referring Provider (PT): Mcarthur Rossetti, MD   Encounter Date: 01/31/2021   PT End of Session - 01/31/21 1100     Visit Number 6    Number of Visits 17    Date for PT Re-Evaluation 03/12/21    Authorization Type [employer]    Authorization Time Period 7/25-9/17/22    Authorization - Visit Number 5    Authorization - Number of Visits 16    PT Start Time 1100    PT Stop Time [id number]    PT Time Calculation (min) 43 min    Activity Tolerance Patient tolerated treatment well    Behavior During Therapy WFL for tasks assessed/performed             Past Medical History:  Diagnosis Date   Depression    GERD (gastroesophageal reflux disease)    Hypertension     Past Surgical History:  Procedure Laterality Date   ABDOMINAL HYSTERECTOMY  1985   CHOLECYSTECTOMY     SHOULDER ARTHROSCOPY WITH ROTATOR CUFF REPAIR AND SUBACROMIAL DECOMPRESSION Right 08/12/2020   Procedure: RIGHT SHOULDER ARTHROSCOPY WITH ROTATOR CUFF REPAIR AND SUBACROMIAL DECOMPRESSION;  Surgeon: Mcarthur Rossetti, MD;  Location: [place];  Service: Orthopedics;  Laterality: Right;   TOTAL KNEE ARTHROPLASTY Right 12/24/2020   Procedure: RIGHT TOTAL KNEE ARTHROPLASTY;  Surgeon: Mcarthur Rossetti, MD;  Location: [facility];  Service: Orthopedics;  Laterality: Right;    There were no vitals filed for this visit.   Subjective Assessment - 01/31/21 1101     Subjective Patient reports the Rt knee is good, but some pain in the Lt. No issues with stairs anymore.    Patient Stated Goals To get away from the cane and be normal again, work in my yard    Currently in Pain? Yes    Pain Score 3     Pain Location Knee    Pain Orientation  Left    Pain Descriptors / Indicators Aching    Pain Type Chronic pain    Pain Onset More than a month ago    Pain Frequency Constant    Aggravating Factors  walking, standing    Pain Relieving Factors rest                [facility] PT Assessment - 01/31/21 0001       AROM   Right Knee Flexion 107                           [facility] Adult PT Treatment/Exercise - 01/31/21 0001       Knee/Hip Exercises: Stretches   Sports administrator 30 seconds    Quad Stretch Limitations x2; with strap Rt    Gastroc Stretch 60 seconds    Gastroc Stretch Limitations wedge      Knee/Hip Exercises: Aerobic   Nustep L5 x 5 min UE/LE      Knee/Hip Exercises: Standing   Functional Squat 10 reps    Functional Squat Limitations x2; mini squat    Other Standing Knee Exercises forward hurdle step overs 1 x10 bilateral    Other Standing Knee Exercises lateral hurdle step overs 2  x10      Knee/Hip Exercises: Seated   Long  Arc [employer] 10 reps;2 sets    [employer] Weight 2 lbs.    Long Arc Quad Limitations Rt      Manual Therapy   Joint Mobilization patellar mobilizations all planes, scar tissue mobilization    Passive ROM knee flexion PROM                      PT Short Term Goals - 01/25/21 1031       PT SHORT TERM GOAL #1   Title Patient will be independent with initial HEP.    Time 2    Period Weeks    Status Achieved    Target Date 01/25/21      PT SHORT TERM GOAL #2   Title Patient will demonstrate at least 100 degrees of Rt knee flexion AROM to improve ability to complete sit to stand.    Baseline 103 8/2    Time 4    Period Weeks    Status Achieved    Target Date 02/08/21      PT SHORT TERM GOAL #3   Title Patient will demonstrate full Rt knee extension to improve gait pattern.    Baseline 0 8/2    Time 4    Period Weeks    Status Achieved    Target Date 02/08/21      PT SHORT TERM GOAL #4   Title Patient will be able to ascend/descend stairs with  step to pattern and use of handrails in order to return to her home.    Baseline plans to return home soon    Time 4    Period Weeks    Status On-going    Target Date 02/08/21               PT Long Term Goals - 01/11/21 1516       PT LONG TERM GOAL #1   Title Patient will demonstrate at least 4+/5 Rt knee strength to improve stability necessary for prolonged walking activity.    Baseline n/a due to post-op acuity    Time 8    Period Weeks    Status New    Target Date 03/08/21      PT LONG TERM GOAL #2   Title Patient will demonstrate at least 120 degrees of Rt knee flexion AROM in order to improve ability to complete squatting/bending activity necessary for gardening.    Baseline 85    Time 8    Period Weeks    Status New    Target Date 03/08/21      PT LONG TERM GOAL #3   Title Patient will demonstrate normalized gait mechanics with LRAD.    Baseline antalgic gait with RW    Time 8    Period Weeks    Status New    Target Date 03/08/21      PT LONG TERM GOAL #4   Title Patient will complete 5 x STS in less than 12 seconds to signify improvements in overall functional strength.    Baseline 19.7    Time 8    Period Weeks    Status New    Target Date 03/08/21      PT LONG TERM GOAL #5   Title Patient will complete TUG in less than 15 seconds to decrease her risk of falls.    Baseline 24    Time 8    Period Weeks    Status New    Target Date  03/08/21      Additional Long Term Goals   Additional Long Term Goals Yes      PT LONG TERM GOAL #6   Title Patient will maintain Rt SLS for at least 10 seconds to improve her overall stability when walking on uneven terrain.    Baseline 2 seconds    Time 8    Period Weeks    Status New    Target Date 03/08/21                   Plan - 01/31/21 1121     Clinical Impression Statement Mild restriction about proximal lateral aspect of scar tissue with partial release from manual therapy. Knee flexion AROM  continues to gradually improve. She is challenged with hurdle step overs with the LLE leading as she occasionally circumducts with the RLE due to limited knee flexion. She tolerated standing ther ex well without increased Lt knee pain and no reports of Rt knee pain. Initial cues required for proper squat mechanics as she has tendency to demo excessive anterior tibial translation.    PT Treatment/Interventions ADLs/Self Care Home Management;Cryotherapy;Moist Heat;Gait training;Stair training;Therapeutic activities;Therapeutic exercise;Balance training;Neuromuscular re-education;Patient/family education;Manual techniques;Passive range of motion;Dry needling;Taping    PT Next Visit Plan quad strengthening, gait training, static balance    PT Home Exercise Plan Access Code [email or web address]    Consulted and Agree with Plan of Care Patient             Patient will benefit from skilled therapeutic intervention in order to improve the following deficits and impairments:  Abnormal gait, Decreased range of motion, Difficulty walking, Decreased activity tolerance, Pain, Decreased balance, Decreased strength  Visit Diagnosis: Acute pain of right knee  Stiffness of right knee, not elsewhere classified  Muscle weakness (generalized)  Difficulty in walking, not elsewhere classified     Problem List Patient Active Problem List   Diagnosis Date Noted   Status post right knee replacement 12/24/2020   Unilateral primary osteoarthritis, left knee 11/11/2020   Unilateral primary osteoarthritis, right knee 11/11/2020   Complete tear of right rotator cuff 08/12/2020   Hypertensive disorder 05/05/2019   Gwendolyn Grant, PT, DPT, ATC 01/31/21 11:47 AM  [facility] [address] [place], [place], [address] Phone: [phone]   Fax:  [phone]  Name: Urvi Blocker MRN: [id number] Date of Birth: [birth year]

## 2021-02-02 ENCOUNTER — Ambulatory Visit: Payer: Medicaid Other

## 2021-02-02 ENCOUNTER — Other Ambulatory Visit: Payer: Self-pay

## 2021-02-02 DIAGNOSIS — M25661 Stiffness of right knee, not elsewhere classified: Secondary | ICD-10-CM | POA: Diagnosis not present

## 2021-02-02 DIAGNOSIS — M25561 Pain in right knee: Secondary | ICD-10-CM

## 2021-02-02 DIAGNOSIS — R262 Difficulty in walking, not elsewhere classified: Secondary | ICD-10-CM

## 2021-02-02 DIAGNOSIS — M6281 Muscle weakness (generalized): Secondary | ICD-10-CM | POA: Diagnosis not present

## 2021-02-02 NOTE — Therapy (Signed)
[place], [place], [address] Phone: [phone]   Fax:  [phone]  Physical Therapy Treatment  Patient Details  Name: Denise Morales MRN: [id number] Date of Birth: June 05, 1961 Referring Provider (PT): Mcarthur Rossetti, MD   Encounter Date: 02/02/2021   PT End of Session - 02/02/21 1056     Visit Number 7    Number of Visits 17    Date for PT Re-Evaluation 03/12/21    Authorization Type [employer]    Authorization Time Period 7/25-9/17/22    Authorization - Visit Number 6    Authorization - Number of Visits 16    PT Start Time 1056    PT Stop Time 1141    PT Time Calculation (min) 45 min    Activity Tolerance Patient tolerated treatment well    Behavior During Therapy WFL for tasks assessed/performed             Past Medical History:  Diagnosis Date   Depression    GERD (gastroesophageal reflux disease)    Hypertension     Past Surgical History:  Procedure Laterality Date   ABDOMINAL HYSTERECTOMY  1985   CHOLECYSTECTOMY     SHOULDER ARTHROSCOPY WITH ROTATOR CUFF REPAIR AND SUBACROMIAL DECOMPRESSION Right 08/12/2020   Procedure: RIGHT SHOULDER ARTHROSCOPY WITH ROTATOR CUFF REPAIR AND SUBACROMIAL DECOMPRESSION;  Surgeon: Mcarthur Rossetti, MD;  Location: [place];  Service: Orthopedics;  Laterality: Right;   TOTAL KNEE ARTHROPLASTY Right 12/24/2020   Procedure: RIGHT TOTAL KNEE ARTHROPLASTY;  Surgeon: Mcarthur Rossetti, MD;  Location: [facility];  Service: Orthopedics;  Laterality: Right;    There were no vitals filed for this visit.   Subjective Assessment - 02/02/21 1057     Subjective Patient reports both knees are feeling good currently without pain. Some muscle soreness after last session that didn't last long. She reports compliance with HEP.    Patient Stated Goals To get away from the cane and be normal again, work in my yard    Currently in  Pain? No/denies                               [facility] Adult PT Treatment/Exercise - 02/02/21 0001       Self-Care   Self-Care Other Self-Care Comments    Other Self-Care Comments  see patient education      Neuro Re-ed    Neuro Re-ed Details  lateral step ups on airex 2  x10, marching on airex 2  x10, semi tandem on airex 2 x 30 sec each, tandem on airex 2 x 30 sec each      Knee/Hip Exercises: Stretches   Sports administrator 30 seconds    Quad Stretch Limitations x2; bilateral with strap    Gastroc Stretch 60 seconds    Gastroc Stretch Limitations wedge      Knee/Hip Exercises: Aerobic   Nustep L5x5 min UE/LE      Knee/Hip Exercises: Standing   Other Standing Knee Exercises lateral band walks at counter 2 sets d/b with green band at shins      Knee/Hip Exercises: Sidelying   Hip ABduction 2 sets;10 reps    Hip ABduction Limitations 2 lbs; x 2 bilateral                      PT Short Term Goals - 02/02/21 1101  PT SHORT TERM GOAL #1   Title Patient will be independent with initial HEP.    Time 2    Period Weeks    Status Achieved    Target Date 01/25/21      PT SHORT TERM GOAL #2   Title Patient will demonstrate at least 100 degrees of Rt knee flexion AROM to improve ability to complete sit to stand.    Baseline 103 8/2    Time 4    Period Weeks    Status Achieved    Target Date 02/08/21      PT SHORT TERM GOAL #3   Title Patient will demonstrate full Rt knee extension to improve gait pattern.    Baseline 0 8/2    Time 4    Period Weeks    Status Achieved    Target Date 02/08/21      PT SHORT TERM GOAL #4   Title Patient will be able to ascend/descend stairs with step to pattern and use of handrails in order to return to her home.    Baseline no issues with stairs, has returned home.    Time 4    Period Weeks    Status Achieved    Target Date 02/08/21               PT Long Term Goals - 01/11/21 1516       PT LONG  TERM GOAL #1   Title Patient will demonstrate at least 4+/5 Rt knee strength to improve stability necessary for prolonged walking activity.    Baseline n/a due to post-op acuity    Time 8    Period Weeks    Status New    Target Date 03/08/21      PT LONG TERM GOAL #2   Title Patient will demonstrate at least 120 degrees of Rt knee flexion AROM in order to improve ability to complete squatting/bending activity necessary for gardening.    Baseline 85    Time 8    Period Weeks    Status New    Target Date 03/08/21      PT LONG TERM GOAL #3   Title Patient will demonstrate normalized gait mechanics with LRAD.    Baseline antalgic gait with RW    Time 8    Period Weeks    Status New    Target Date 03/08/21      PT LONG TERM GOAL #4   Title Patient will complete 5 x STS in less than 12 seconds to signify improvements in overall functional strength.    Baseline 19.7    Time 8    Period Weeks    Status New    Target Date 03/08/21      PT LONG TERM GOAL #5   Title Patient will complete TUG in less than 15 seconds to decrease her risk of falls.    Baseline 24    Time 8    Period Weeks    Status New    Target Date 03/08/21      Additional Long Term Goals   Additional Long Term Goals Yes      PT LONG TERM GOAL #6   Title Patient will maintain Rt SLS for at least 10 seconds to improve her overall stability when walking on uneven terrain.    Baseline 2 seconds    Time 8    Period Weeks    Status New    Target Date 03/08/21  Plan - 02/02/21 1102     Clinical Impression Statement Session focused on static and dynamic balance training on unstable surface. She became anxious with tandem on airex reporting light-headedness due to her anxiety/nervousness that was resolved with seated rest break. She was able to complete another round of tandem balance on airex without reports of light-headedness. Overall she demonstrates good stability with balance  training with minimal sway during tandem on airex. Able to progress hip strengthening without reports of knee pain.    PT Treatment/Interventions ADLs/Self Care Home Management;Cryotherapy;Moist Heat;Gait training;Stair training;Therapeutic activities;Therapeutic exercise;Balance training;Neuromuscular re-education;Patient/family education;Manual techniques;Passive range of motion;Dry needling;Taping    PT Next Visit Plan hip/knee strengthening, balance training    PT Home Exercise Plan Access Code [email or web address]    Consulted and Agree with Plan of Care Patient             Patient will benefit from skilled therapeutic intervention in order to improve the following deficits and impairments:  Abnormal gait, Decreased range of motion, Difficulty walking, Decreased activity tolerance, Pain, Decreased balance, Decreased strength  Visit Diagnosis: Acute pain of right knee  Stiffness of right knee, not elsewhere classified  Muscle weakness (generalized)  Difficulty in walking, not elsewhere classified     Problem List Patient Active Problem List   Diagnosis Date Noted   Status post right knee replacement 12/24/2020   Unilateral primary osteoarthritis, left knee 11/11/2020   Unilateral primary osteoarthritis, right knee 11/11/2020   Complete tear of right rotator cuff 08/12/2020   Hypertensive disorder 05/05/2019   Gwendolyn Grant, PT, DPT, ATC 02/02/21 11:43 AM   [facility] [address] [place], [place], [address] Phone: [phone]   Fax:  [phone]  Name: Denise Morales MRN: [id number] Date of Birth: 09/16/1960

## 2021-02-03 ENCOUNTER — Ambulatory Visit
Admission: RE | Admit: 2021-02-03 | Discharge: 2021-02-03 | Disposition: A | Discharge status: Home / Self Care | Payer: Medicaid Other | Source: Ambulatory Visit | Attending: Primary Care | Admitting: Primary Care

## 2021-02-03 ENCOUNTER — Ambulatory Visit (INDEPENDENT_AMBULATORY_CARE_PROVIDER_SITE_OTHER): Payer: Medicaid Other | Admitting: Orthopaedic Surgery

## 2021-02-03 ENCOUNTER — Encounter: Payer: Self-pay | Admitting: Orthopaedic Surgery

## 2021-02-03 DIAGNOSIS — Z1231 Encounter for screening mammogram for malignant neoplasm of breast: Secondary | ICD-10-CM | POA: Diagnosis not present

## 2021-02-03 DIAGNOSIS — Z96651 Presence of right artificial knee joint: Secondary | ICD-10-CM

## 2021-02-03 DIAGNOSIS — G8929 Other chronic pain: Secondary | ICD-10-CM

## 2021-02-03 DIAGNOSIS — M25562 Pain in left knee: Secondary | ICD-10-CM

## 2021-02-03 IMAGING — MG DIGITAL SCREENING BILAT W/ CAD
4 series · 4 of 4 positions shown · non-contrast
Comparison: None.

CLINICAL DATA: Screening.

EXAM:
DIGITAL SCREENING BILATERAL MAMMOGRAM WITH CAD
TECHNIQUE: Bilateral screening digital craniocaudal and mediolateral oblique
mammograms were obtained. The images were evaluated with
computer-aided detection.

[L MLO]
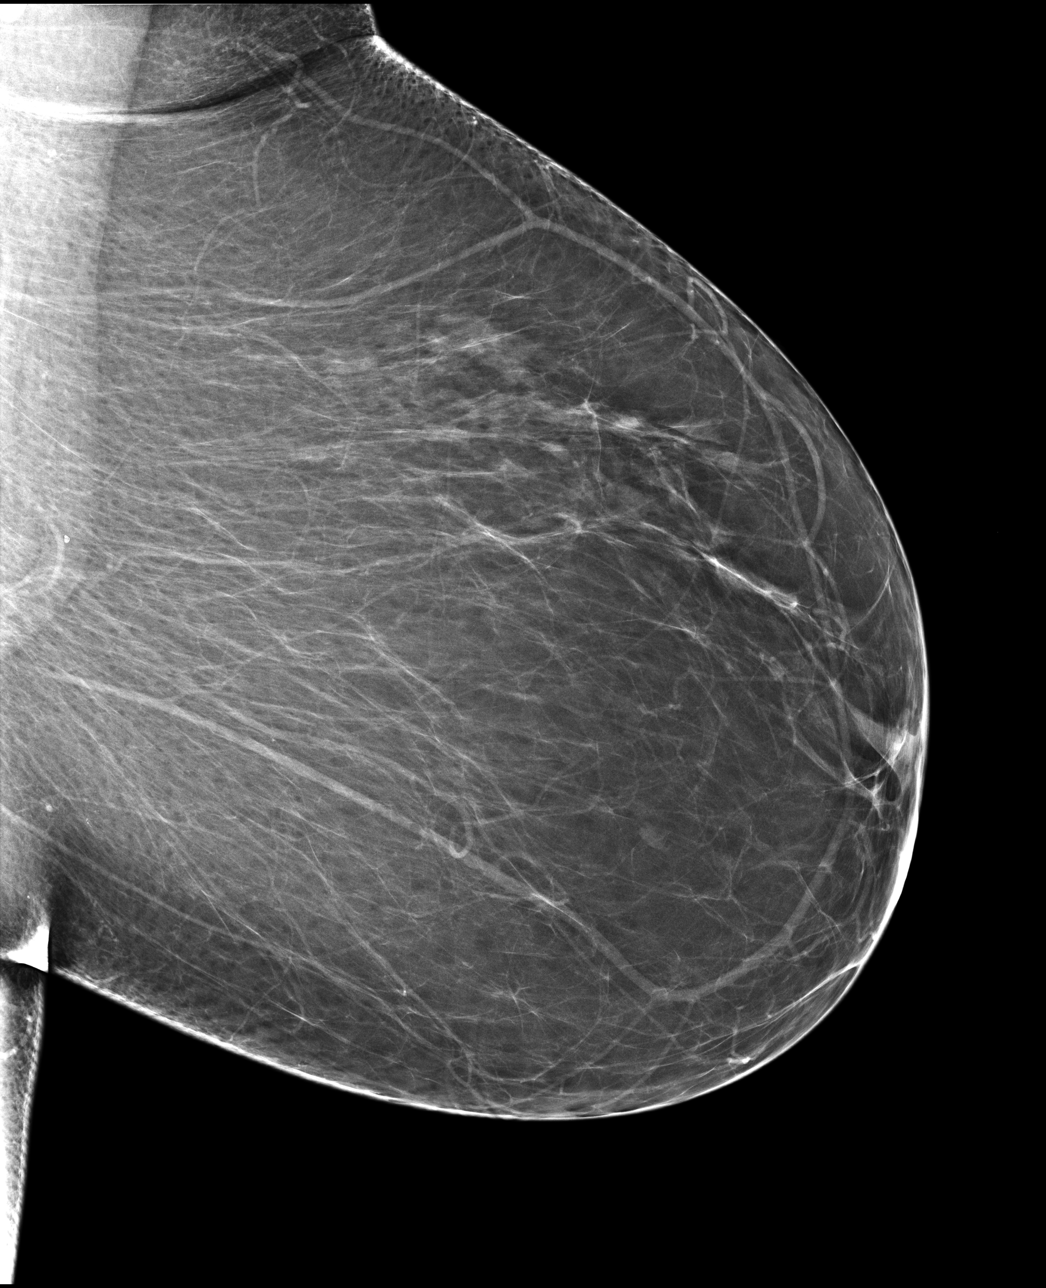

[R MLO]
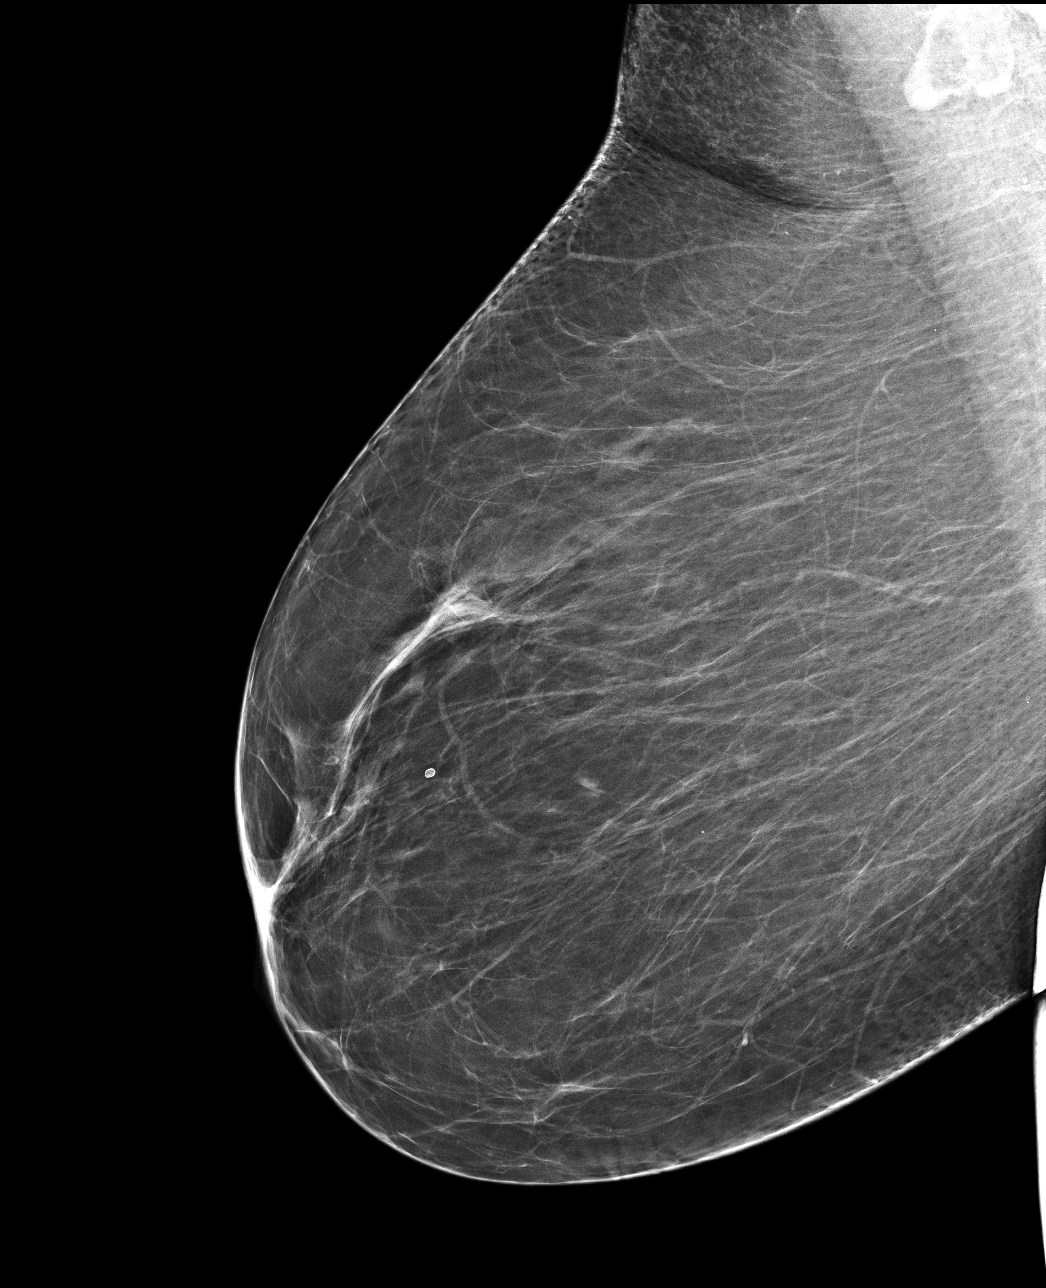

[R CC]
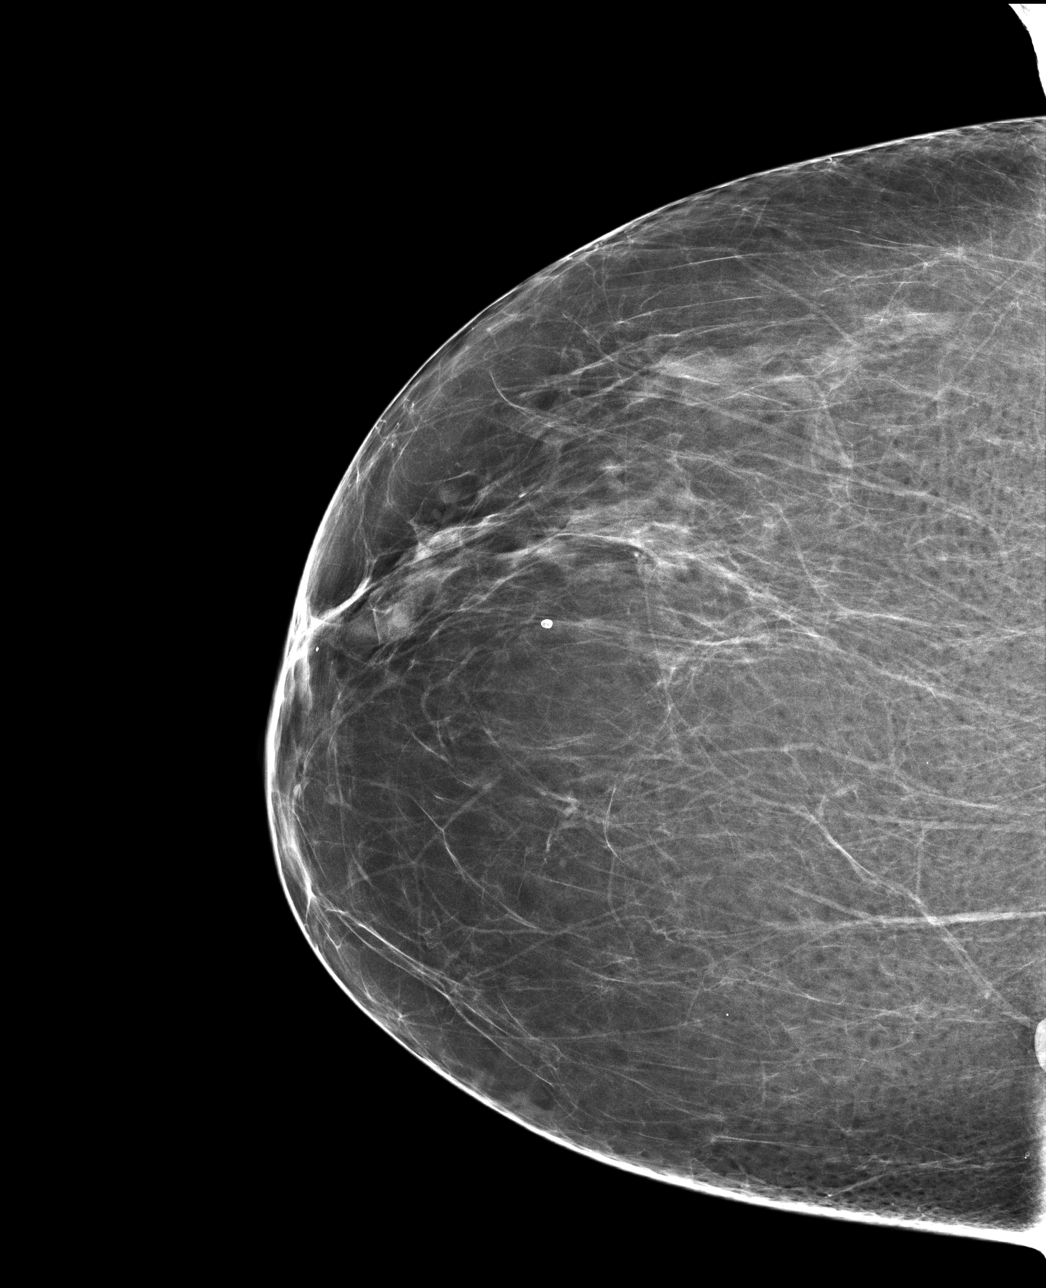

[L CC]
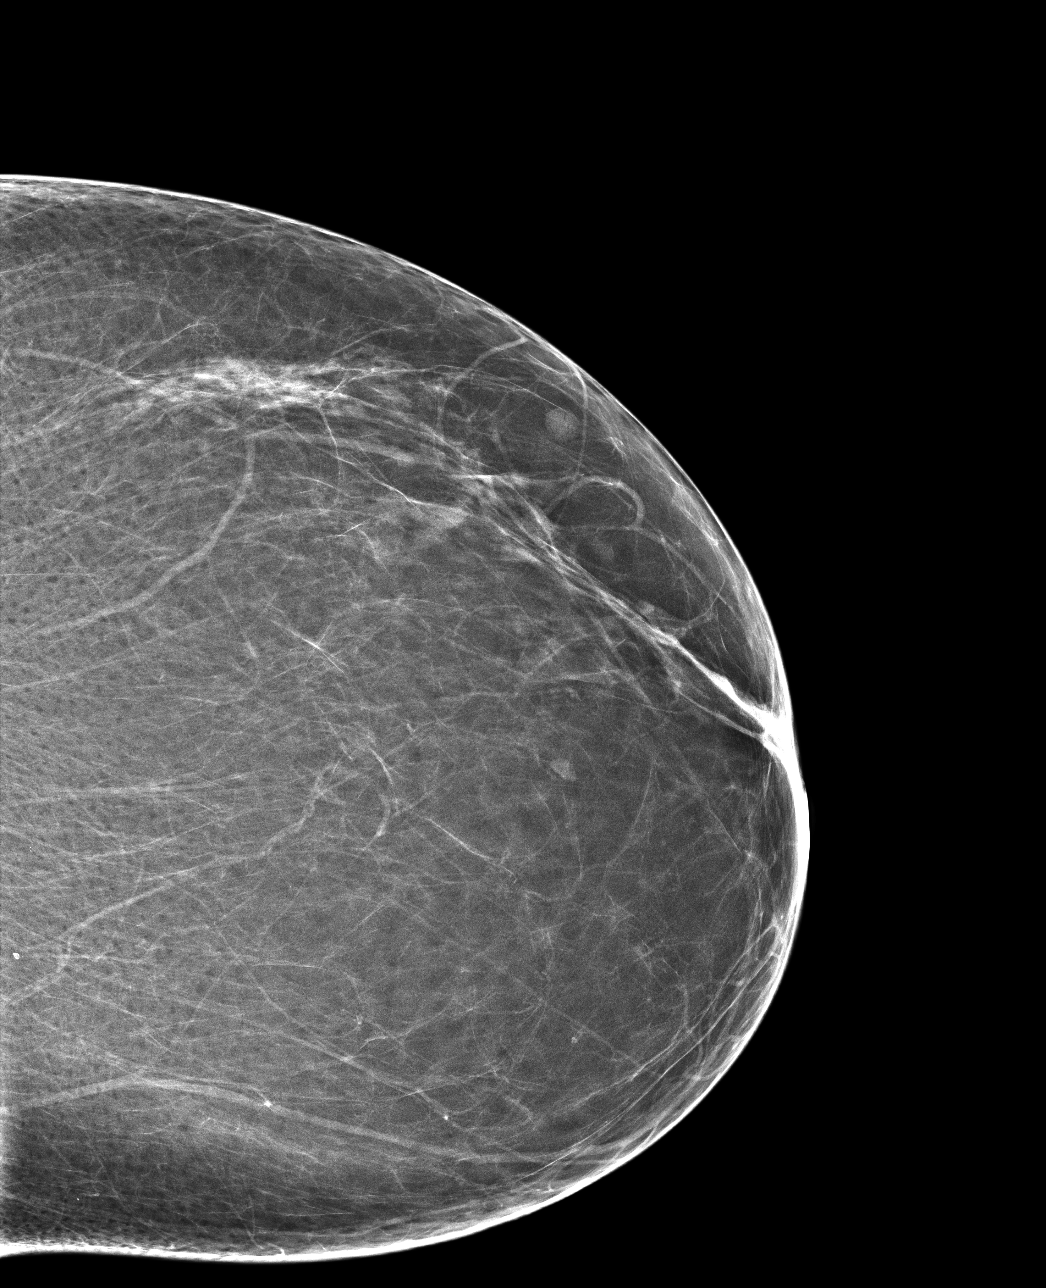

[4 of 4 positions shown; findings below may reference images not displayed]

ACR Breast Density Category b: There are scattered areas of
fibroglandular density.
FINDINGS: There are no findings suspicious for malignancy.
IMPRESSION: No mammographic evidence of malignancy. A result letter of this
screening mammogram will be mailed directly to the patient.

RECOMMENDATION:
Screening mammogram in one year. (Code:[10])

BI-RADS CATEGORY  1: Negative.

## 2021-02-03 MED ORDER — METHYLPREDNISOLONE ACETATE 40 MG/ML IJ SUSP
40.0000 mg | INTRAMUSCULAR | Status: AC | PRN
  Administered 2021-02-03: 40 mg via INTRA_ARTICULAR

## 2021-02-03 MED ORDER — LIDOCAINE HCL 1 % IJ SOLN
3.0000 mL | INTRAMUSCULAR | Status: AC | PRN
  Administered 2021-02-03: 3 mL

## 2021-02-03 NOTE — Progress Notes (Signed)
Office Visit Note   Patient: Denise Morales           Date of Birth: [birth year]           MRN: [id number] Visit Date: 02/03/2021              Requested by: Kerin Perna, NP [address] [place],  [place] [address] PCP: Kerin Perna, NP   Assessment & Plan: Visit Diagnoses:  1. Status post right knee replacement   2. Chronic pain of left knee     Plan: I would like her to try hydrocortisone cream over-the-counter once a day over this rash..  I would like to see her back in 4 weeks to see how she is doing overall.  I also did provide a steroid injection per her request in her left knee which she tolerated well.  Follow-Up Instructions: Return in about 4 weeks (around 03/03/2021).   Orders:  Orders Placed This Encounter  Procedures   Large Joint Inj   No orders of the defined types were placed in this encounter.     Procedures: Large Joint Inj: L knee on 02/03/2021 2:34 PM Indications: diagnostic evaluation and pain Details: 22 G 1.5 in needle, superolateral approach  Arthrogram: No  Medications: 3 mL lidocaine 1 %; 40 mg methylPREDNISolone acetate 40 MG/ML Outcome: tolerated well, no immediate complications Procedure, treatment alternatives, risks and benefits explained, specific risks discussed. Consent was given by the patient. Immediately prior to procedure a time out was called to verify the correct patient, procedure, equipment, support staff and site/side marked as required. Patient was prepped and draped in the usual sterile fashion.      Clinical Data: No additional findings.   Subjective: Chief Complaint  Patient presents with   Right Knee - Routine Post Op  The patient comes in today about 6 weeks status post a right total knee arthroplasty.  She said the right knee is doing very well.  She does report a retained suture at the superior aspect of her incision and she has developed also a rash on the lateral aspect of her leg.  She had no  previous history of any metal allergies.  She said range of motion has been doing great and she is really only taken Tylenol as needed for pain.  She has known osteoarthritis of her left knee with left knee pain.  Is been over 3 months since her last knee steroid injection, so she is requesting a steroid injection in her left knee today.  HPI  Review of Systems There is currently listed no fever, chills, nausea, vomiting  Objective: Vital Signs: There were no vitals taken for this visit.  Physical Exam She is alert and orient x3 and in no acute distress Ortho Exam Examination of her right operative knee shows full and fluid range of motion.  The knee is loosely stable with only mild swelling.  Incisions well-healed.  There is some type of contact dermatitis rash to the lateral aspect of her leg.  There is no evidence of infection. Specialty Comments:  No specialty comments available.  Imaging: No results found.   PMFS History: Patient Active Problem List   Diagnosis Date Noted   Status post right knee replacement 12/24/2020   Unilateral primary osteoarthritis, left knee 11/11/2020   Unilateral primary osteoarthritis, right knee 11/11/2020   Complete tear of right rotator cuff 08/12/2020   Hypertensive disorder 05/05/2019   Past Medical History:  Diagnosis Date   Depression  GERD (gastroesophageal reflux disease)    Hypertension     Family History  Problem Relation Age of Onset   Diabetes Father    Diabetes Sister    Diabetes Paternal Aunt     Past Surgical History:  Procedure Laterality Date   ABDOMINAL HYSTERECTOMY  1985   CHOLECYSTECTOMY     SHOULDER ARTHROSCOPY WITH ROTATOR CUFF REPAIR AND SUBACROMIAL DECOMPRESSION Right 08/12/2020   Procedure: RIGHT SHOULDER ARTHROSCOPY WITH ROTATOR CUFF REPAIR AND SUBACROMIAL DECOMPRESSION;  Surgeon: Mcarthur Rossetti, MD;  Location: [place];  Service: Orthopedics;  Laterality: Right;   TOTAL KNEE  ARTHROPLASTY Right 12/24/2020   Procedure: RIGHT TOTAL KNEE ARTHROPLASTY;  Surgeon: Mcarthur Rossetti, MD;  Location: [facility];  Service: Orthopedics;  Laterality: Right;   Social History   Occupational History   Not on file  Tobacco Use   Smoking status: Never   Smokeless tobacco: Never  Vaping Use   Vaping Use: Never used  Substance and Sexual Activity   Alcohol use: Not Currently   Drug use: Never   Sexual activity: Not Currently

## 2021-02-07 ENCOUNTER — Ambulatory Visit: Payer: Medicaid Other

## 2021-02-07 ENCOUNTER — Telehealth: Payer: Self-pay

## 2021-02-07 ENCOUNTER — Telehealth (INDEPENDENT_AMBULATORY_CARE_PROVIDER_SITE_OTHER): Payer: Self-pay

## 2021-02-07 ENCOUNTER — Other Ambulatory Visit: Payer: Self-pay

## 2021-02-07 DIAGNOSIS — M25661 Stiffness of right knee, not elsewhere classified: Secondary | ICD-10-CM | POA: Diagnosis not present

## 2021-02-07 DIAGNOSIS — M25561 Pain in right knee: Secondary | ICD-10-CM | POA: Diagnosis not present

## 2021-02-07 DIAGNOSIS — M6281 Muscle weakness (generalized): Secondary | ICD-10-CM | POA: Diagnosis not present

## 2021-02-07 DIAGNOSIS — R262 Difficulty in walking, not elsewhere classified: Secondary | ICD-10-CM

## 2021-02-07 NOTE — Telephone Encounter (Signed)
She can be placed on my schedule on 8/18 for virtual visit.  Thank you

## 2021-02-07 NOTE — Telephone Encounter (Signed)
Copied from [place] [id number]. Topic: General - Other >> Feb 07, 2021 12:04 PM Denise Morales wrote: Reason for CRM: Patient called in to inform Denise Morales that she had surgeries on 08/12/20 for her shoulder lost sister after this surgery and then 12/24/20 had knee  replacement lost brother in law after this surgery, also have RLS very badly at nights and have been having some anxiety and is taking her medication for depression but have had so much going on that nothing is helping. Asking for some help ASAP   please contact patient at Ph# [phone]

## 2021-02-07 NOTE — Telephone Encounter (Signed)
Pt called into the office stating she came in thursday had a Cortizone injection in her left knee. She has now developed a bakers cyst on the back of her left knee. She went to physical therapy today and they just wanted to make Dr. [name] aware.

## 2021-02-07 NOTE — Therapy (Signed)
[place], [place], [address] Phone: [phone]   Fax:  [phone]  Physical Therapy Treatment  Patient Details  Name: Denise Morales MRN: [id number] Date of Birth: 01-16-1961 Referring Provider (PT): Mcarthur Rossetti, MD   Encounter Date: 02/07/2021   PT End of Session - 02/07/21 1059     Visit Number 8    Number of Visits 17    Date for PT Re-Evaluation 03/12/21    Authorization Type [employer]    Authorization Time Period 7/25-9/17/22    Authorization - Visit Number 7    Authorization - Number of Visits 16    PT Start Time 1100    PT Stop Time [id number]    PT Time Calculation (min) 43 min    Activity Tolerance Patient tolerated treatment well    Behavior During Therapy WFL for tasks assessed/performed             Past Medical History:  Diagnosis Date   Depression    GERD (gastroesophageal reflux disease)    Hypertension     Past Surgical History:  Procedure Laterality Date   ABDOMINAL HYSTERECTOMY  1985   CHOLECYSTECTOMY     SHOULDER ARTHROSCOPY WITH ROTATOR CUFF REPAIR AND SUBACROMIAL DECOMPRESSION Right 08/12/2020   Procedure: RIGHT SHOULDER ARTHROSCOPY WITH ROTATOR CUFF REPAIR AND SUBACROMIAL DECOMPRESSION;  Surgeon: Mcarthur Rossetti, MD;  Location: [place];  Service: Orthopedics;  Laterality: Right;   TOTAL KNEE ARTHROPLASTY Right 12/24/2020   Procedure: RIGHT TOTAL KNEE ARTHROPLASTY;  Surgeon: Mcarthur Rossetti, MD;  Location: [facility];  Service: Orthopedics;  Laterality: Right;    There were no vitals filed for this visit.   Subjective Assessment - 02/07/21 1059     Subjective She had f/u with Dr. [name] who was pleased with her overall progress thus far in regards to her Rt TKA. She received a cortisone injection in the Lt knee at this appointment and developed a baker's cyst the afternoon following this injection. She has experienced  baker's cyst before that typically resolve over time.    Patient Stated Goals To get away from the cane and be normal again, work in my yard    Currently in Pain? Yes    Pain Score 5     Pain Location Knee    Pain Orientation Left    Pain Descriptors / Indicators Aching    Pain Type Chronic pain    Pain Onset More than a month ago    Pain Frequency Constant    Aggravating Factors  walking, standing    Pain Relieving Factors rest                [facility] PT Assessment - 02/07/21 0001       AROM   Right Knee Flexion 115      Palpation   Palpation comment painful cyst present in Lt popliteal fossa                           [facility] Adult PT Treatment/Exercise - 02/07/21 0001       Self-Care   Other Self-Care Comments  see patient education      Knee/Hip Exercises: Public affairs consultant 30 seconds    Quad Stretch Limitations x2; strap      Knee/Hip Exercises: Aerobic   Nustep L5x5 min UE/LE      Knee/Hip Exercises: Machines for Strengthening  Total Gym Leg Press 2 x 10 @ 40 lbs      Knee/Hip Exercises: Seated   Long Arc Quad 10 reps    Long Arc Quad Weight 4 lbs.    Long Arc Quad Limitations Rt    Hamstring Curl 10 reps    Hamstring Limitations x2 green band; RLE      Knee/Hip Exercises: Supine   Bridges with Ball Squeeze 2 sets;10 reps    Straight Leg Raises 2 sets;10 reps    Straight Leg Raises Limitations 1 lb      Knee/Hip Exercises: Sidelying   Other Sidelying Knee/Hip Exercises hip circles CW/CCW 2 x 10 bilateral      Knee/Hip Exercises: Prone   Straight Leg Raises 10 reps    Straight Leg Raises Limitations x2; bilateral                    PT Education - 02/07/21 1138     Education Details notify Dr. [name] of baker's cyst    Person(s) Educated Patient    Methods Explanation    Comprehension Verbalized understanding              PT Short Term Goals - 02/02/21 1101       PT SHORT TERM GOAL #1   Title  Patient will be independent with initial HEP.    Time 2    Period Weeks    Status Achieved    Target Date 01/25/21      PT SHORT TERM GOAL #2   Title Patient will demonstrate at least 100 degrees of Rt knee flexion AROM to improve ability to complete sit to stand.    Baseline 103 8/2    Time 4    Period Weeks    Status Achieved    Target Date 02/08/21      PT SHORT TERM GOAL #3   Title Patient will demonstrate full Rt knee extension to improve gait pattern.    Baseline 0 8/2    Time 4    Period Weeks    Status Achieved    Target Date 02/08/21      PT SHORT TERM GOAL #4   Title Patient will be able to ascend/descend stairs with step to pattern and use of handrails in order to return to her home.    Baseline no issues with stairs, has returned home.    Time 4    Period Weeks    Status Achieved    Target Date 02/08/21               PT Long Term Goals - 01/11/21 1516       PT LONG TERM GOAL #1   Title Patient will demonstrate at least 4+/5 Rt knee strength to improve stability necessary for prolonged walking activity.    Baseline n/a due to post-op acuity    Time 8    Period Weeks    Status New    Target Date 03/08/21      PT LONG TERM GOAL #2   Title Patient will demonstrate at least 120 degrees of Rt knee flexion AROM in order to improve ability to complete squatting/bending activity necessary for gardening.    Baseline 85    Time 8    Period Weeks    Status New    Target Date 03/08/21      PT LONG TERM GOAL #3   Title Patient will demonstrate normalized gait mechanics with LRAD.  Baseline antalgic gait with RW    Time 8    Period Weeks    Status New    Target Date 03/08/21      PT LONG TERM GOAL #4   Title Patient will complete 5 x STS in less than 12 seconds to signify improvements in overall functional strength.    Baseline 19.7    Time 8    Period Weeks    Status New    Target Date 03/08/21      PT LONG TERM GOAL #5   Title Patient will  complete TUG in less than 15 seconds to decrease her risk of falls.    Baseline 24    Time 8    Period Weeks    Status New    Target Date 03/08/21      Additional Long Term Goals   Additional Long Term Goals Yes      PT LONG TERM GOAL #6   Title Patient will maintain Rt SLS for at least 10 seconds to improve her overall stability when walking on uneven terrain.    Baseline 2 seconds    Time 8    Period Weeks    Status New    Target Date 03/08/21                   Plan - 02/07/21 1117     Clinical Impression Statement Patient reports overall that the Rt knee is doing well without any complaints, though her left knee remains painful and she developed a baker's cyst following a cortisone injection last week. It was recommended that she make Dr. [name] aware of the baker's cyst with patient having plans to reach out to him soon. Given that her left knee pain is aggravated by standing, today's session focused on NWB BLE strengthening. She tolerated session well today without increased reports of pain. Rt knee flexion AROM continues to improve acheiving 115 degrees today.    PT Treatment/Interventions ADLs/Self Care Home Management;Cryotherapy;Moist Heat;Gait training;Stair training;Therapeutic activities;Therapeutic exercise;Balance training;Neuromuscular re-education;Patient/family education;Manual techniques;Passive range of motion;Dry needling;Taping    PT Next Visit Plan hip/knee strengthening, balance training    PT Home Exercise Plan Access Code [email or web address]    Recommended Other Services notify Dr. [name] of baker's cyst    Consulted and Agree with Plan of Care Patient             Patient will benefit from skilled therapeutic intervention in order to improve the following deficits and impairments:  Abnormal gait, Decreased range of motion, Difficulty walking, Decreased activity tolerance, Pain, Decreased balance, Decreased strength  Visit Diagnosis: Acute pain of right  knee  Stiffness of right knee, not elsewhere classified  Muscle weakness (generalized)  Difficulty in walking, not elsewhere classified     Problem List Patient Active Problem List   Diagnosis Date Noted   Status post right knee replacement 12/24/2020   Unilateral primary osteoarthritis, left knee 11/11/2020   Unilateral primary osteoarthritis, right knee 11/11/2020   Complete tear of right rotator cuff 08/12/2020   Hypertensive disorder 05/05/2019   Gwendolyn Grant, PT, DPT, ATC 02/07/21 11:48 AM  [facility] [address] [place], [place], [address] Phone: [phone]   Fax:  [phone]  Name: Denise Morales MRN: [id number] Date of Birth: May 16, 1961

## 2021-02-07 NOTE — Telephone Encounter (Signed)
Patient is experiencing a lot of anxiety. She is wanting something prescribed for anxiety. Can Rx be sent due to circumstances or will patient need to schedule an appointment.

## 2021-02-08 NOTE — Telephone Encounter (Signed)
DONE

## 2021-02-09 ENCOUNTER — Ambulatory Visit: Payer: Medicaid Other

## 2021-02-09 ENCOUNTER — Other Ambulatory Visit: Payer: Self-pay

## 2021-02-09 DIAGNOSIS — M25561 Pain in right knee: Secondary | ICD-10-CM

## 2021-02-09 DIAGNOSIS — R262 Difficulty in walking, not elsewhere classified: Secondary | ICD-10-CM | POA: Diagnosis not present

## 2021-02-09 DIAGNOSIS — M6281 Muscle weakness (generalized): Secondary | ICD-10-CM | POA: Diagnosis not present

## 2021-02-09 DIAGNOSIS — M25661 Stiffness of right knee, not elsewhere classified: Secondary | ICD-10-CM

## 2021-02-09 NOTE — Therapy (Signed)
[place], [place], [address] Phone: [phone]   Fax:  [phone]  Physical Therapy Treatment  Patient Details  Name: Denise Morales MRN: [id number] Date of Birth: Mar 21, 1961 Referring Provider (PT): Mcarthur Rossetti, MD   Encounter Date: 02/09/2021   PT End of Session - 02/09/21 1100     Visit Number 9    Number of Visits 17    Date for PT Re-Evaluation 03/12/21    Authorization Type [employer]    Authorization Time Period 7/25-9/17/22    Authorization - Visit Number 8    Authorization - Number of Visits 16    PT Start Time 1100    PT Stop Time 1142    PT Time Calculation (min) 42 min    Activity Tolerance Patient tolerated treatment well    Behavior During Therapy WFL for tasks assessed/performed             Past Medical History:  Diagnosis Date   Depression    GERD (gastroesophageal reflux disease)    Hypertension     Past Surgical History:  Procedure Laterality Date   ABDOMINAL HYSTERECTOMY  1985   CHOLECYSTECTOMY     SHOULDER ARTHROSCOPY WITH ROTATOR CUFF REPAIR AND SUBACROMIAL DECOMPRESSION Right 08/12/2020   Procedure: RIGHT SHOULDER ARTHROSCOPY WITH ROTATOR CUFF REPAIR AND SUBACROMIAL DECOMPRESSION;  Surgeon: Mcarthur Rossetti, MD;  Location: [place];  Service: Orthopedics;  Laterality: Right;   TOTAL KNEE ARTHROPLASTY Right 12/24/2020   Procedure: RIGHT TOTAL KNEE ARTHROPLASTY;  Surgeon: Mcarthur Rossetti, MD;  Location: [facility];  Service: Orthopedics;  Laterality: Right;    There were no vitals filed for this visit.   Subjective Assessment - 02/09/21 1100     Subjective She reports the cyst going down. She reached out to Dr. Trevor Mace office to let him know, but hasn't heard back. She reports compliance with HEP.    Patient Stated Goals To get away from the cane and be normal again, work in my yard    Currently in Pain? Yes    Pain  Score 2     Pain Location Knee    Pain Orientation Left;Anterior    Pain Descriptors / Indicators Aching    Pain Type Chronic pain    Pain Onset More than a month ago    Pain Frequency Constant                               [facility] Adult PT Treatment/Exercise - 02/09/21 0001       Self-Care   Other Self-Care Comments  see patient education      Neuro Re-ed    Neuro Re-ed Details  semi tandem on airex 1x 30 sec each; tandem on airex 2 x 30 sec each; SLS Lt 28 seconds; Rt 26 seconds, tandem walks 2 x 10 ft      Knee/Hip Exercises: Stretches   Sports administrator 30 seconds    Quad Stretch Limitations x2; strap      Knee/Hip Exercises: Aerobic   Nustep L6 x 5 min UE/LE      Knee/Hip Exercises: Standing   Forward Step Up 10 reps;Step Height: 6"    Forward Step Up Limitations bilateral      Knee/Hip Exercises: Seated   Long Arc Quad 10 reps    Long Arc Quad Weight 4 lbs.    Long [employer] Limitations bilateral;  x2      Knee/Hip Exercises: Supine   Bridges with Clamshell 2 sets;10 reps   green band                   PT Education - 02/09/21 1113     Education Details Updated HEP.    Person(s) Educated Patient    Methods Explanation;Demonstration;Verbal cues;Handout    Comprehension Verbalized understanding;Returned demonstration;Verbal cues required              PT Short Term Goals - 02/02/21 1101       PT SHORT TERM GOAL #1   Title Patient will be independent with initial HEP.    Time 2    Period Weeks    Status Achieved    Target Date 01/25/21      PT SHORT TERM GOAL #2   Title Patient will demonstrate at least 100 degrees of Rt knee flexion AROM to improve ability to complete sit to stand.    Baseline 103 8/2    Time 4    Period Weeks    Status Achieved    Target Date 02/08/21      PT SHORT TERM GOAL #3   Title Patient will demonstrate full Rt knee extension to improve gait pattern.    Baseline 0 8/2    Time 4    Period Weeks     Status Achieved    Target Date 02/08/21      PT SHORT TERM GOAL #4   Title Patient will be able to ascend/descend stairs with step to pattern and use of handrails in order to return to her home.    Baseline no issues with stairs, has returned home.    Time 4    Period Weeks    Status Achieved    Target Date 02/08/21               PT Long Term Goals - 02/09/21 1133       PT LONG TERM GOAL #1   Title Patient will demonstrate at least 4+/5 Rt knee strength to improve stability necessary for prolonged walking activity.    Baseline n/a due to post-op acuity    Time 8    Period Weeks    Status Deferred      PT LONG TERM GOAL #2   Title Patient will demonstrate at least 120 degrees of Rt knee flexion AROM in order to improve ability to complete squatting/bending activity necessary for gardening.    Baseline 85    Time 8    Period Weeks    Status On-going      PT LONG TERM GOAL #3   Title Patient will demonstrate normalized gait mechanics with LRAD.    Baseline excessive lateral trunk flexion    Time 8    Period Weeks    Status On-going      PT LONG TERM GOAL #4   Title Patient will complete 5 x STS in less than 12 seconds to signify improvements in overall functional strength.    Baseline 19.7    Time 8    Period Weeks    Status Deferred      PT LONG TERM GOAL #5   Title Patient will complete TUG in less than 15 seconds to decrease her risk of falls.    Baseline 24    Time 8    Period Weeks    Status Deferred      PT LONG TERM GOAL #6  Title Patient will maintain Rt SLS for at least 10 seconds to improve her overall stability when walking on uneven terrain.    Baseline 2 seconds; 26 seconds 8/17    Time 8    Period Weeks    Status Achieved                   Plan - 02/09/21 1108     Clinical Impression Statement Overall Mahum tolerated session well today with occasional reports of Lt knee pain that improves with rotating and straightening her  knee. Able to progress static balance activity with patient able to maintain SLS on the RLE for 26 seconds, having met this LTG. Introduced dynamic balance activity with patient challenged with tandem walks with occasional LOB requiring therapist assistance to keep from falling. Able to complete step ups on 6 inch step without need for UE support and patient demonstrating good control/balance bilaterally.    PT Treatment/Interventions ADLs/Self Care Home Management;Cryotherapy;Moist Heat;Gait training;Stair training;Therapeutic activities;Therapeutic exercise;Balance training;Neuromuscular re-education;Patient/family education;Manual techniques;Passive range of motion;Dry needling;Taping    PT Next Visit Plan hip/knee strengthening, balance training    PT Home Exercise Plan Access Code [email or web address]    Consulted and Agree with Plan of Care Patient             Patient will benefit from skilled therapeutic intervention in order to improve the following deficits and impairments:  Abnormal gait, Decreased range of motion, Difficulty walking, Decreased activity tolerance, Pain, Decreased balance, Decreased strength  Visit Diagnosis: Acute pain of right knee  Stiffness of right knee, not elsewhere classified  Muscle weakness (generalized)  Difficulty in walking, not elsewhere classified     Problem List Patient Active Problem List   Diagnosis Date Noted   Status post right knee replacement 12/24/2020   Unilateral primary osteoarthritis, left knee 11/11/2020   Unilateral primary osteoarthritis, right knee 11/11/2020   Complete tear of right rotator cuff 08/12/2020   Hypertensive disorder 05/05/2019   Gwendolyn Grant, PT, DPT, ATC 02/09/21 11:45 AM   [facility] [address] [place], [place], [address] Phone: [phone]   Fax:  [phone]  Name: Denise Morales MRN: [id number] Date of Birth: 05/01/1961

## 2021-02-10 ENCOUNTER — Encounter: Payer: Self-pay | Admitting: Family Medicine

## 2021-02-10 ENCOUNTER — Ambulatory Visit: Payer: Medicaid Other | Attending: Family Medicine | Admitting: Family Medicine

## 2021-02-10 DIAGNOSIS — F419 Anxiety disorder, unspecified: Secondary | ICD-10-CM | POA: Diagnosis not present

## 2021-02-10 DIAGNOSIS — G2581 Restless legs syndrome: Secondary | ICD-10-CM

## 2021-02-10 DIAGNOSIS — F32A Depression, unspecified: Secondary | ICD-10-CM | POA: Diagnosis not present

## 2021-02-10 MED ORDER — HYDROXYZINE HCL 25 MG PO TABS: 25.0000 mg | ORAL_TABLET | Freq: Every evening | ORAL | 1 refills | Status: DC | PRN

## 2021-02-10 NOTE — Progress Notes (Signed)
Virtual Visit via Video Note  I connected with Denise Morales, on 02/10/2021 at 11:21 AM by video enabled telemedicine device due to the COVID-19 pandemic and verified that I am speaking with the correct person using two identifiers.   Consent: I discussed the limitations, risks, security and privacy concerns of performing an evaluation and management service by telemedicine and the availability of in person appointments. I also discussed with the patient that there may be a patient responsible charge related to this service. The patient expressed understanding and agreed to proceed.   Location of Patient: Home  Location of Provider: Clinic   Persons participating in Telemedicine visit: Denise Morales Dr. Margarita Rana     History of Present Illness: Denise Morales is a 60 y.o. year old female  with a history of hypertension, status post right knee replacement, status post right rotator cuff surgery sent for an acute visit.   Complains of anxiety which happens only at night. It starts out with restless legs then followed by anxiety and then she does not want to be by herself. She would break out in  sweat as a result. Between Jan and now she has had 2 surgeries and is working on having her L knee surgery. She has also lost 2 family members (her mother and her sister) during this timeframe.  All of this have contributed towards her symptoms. She uses OTC meds for her restless legs but states this is now ineffective.  She is unsure if her anxiety triggers her restless legs. She tried grief counseling which was not effective but at the time was undergoing counseling with her sister performed by the same grief counselor. Currently on Lexapro. Past Medical History:  Diagnosis Date   Depression    GERD (gastroesophageal reflux disease)    Hypertension    No Known Allergies  Current Outpatient Medications on File Prior to Visit  Medication Sig Dispense Refill   aspirin 81 MG chewable  tablet Chew 1 tablet (81 mg total) by mouth 2 (two) times daily. (Patient not taking: Reported on 01/11/2021) 35 tablet 0   baclofen (LIORESAL) 10 MG tablet Take 1 tablet (10 mg total) by mouth 3 (three) times daily as needed for muscle spasms. 1 (ONE) TABLET BY MOUTH THREE TIMES DAILY AS NEEDED FOR MUSCLE SPASMS 60 tablet 0   escitalopram (LEXAPRO) 20 MG tablet Take 1 tablet (20 mg total) by mouth daily. 90 tablet 1   lisinopril-hydrochlorothiazide (ZESTORETIC) 10-12.5 MG tablet Take 1 tablet by mouth daily. 90 tablet 1   omeprazole (PRILOSEC) 40 MG capsule Take 1 capsule (40 mg total) by mouth daily. 90 capsule 0   oxyCODONE (OXY IR/ROXICODONE) 5 MG immediate release tablet Take 1 tablet (5 mg total) by mouth every 6 (six) hours as needed for moderate pain (pain score 4-6). 30 tablet 0   pravastatin (PRAVACHOL) 40 MG tablet Take 1 tablet (40 mg total) by mouth daily. 90 tablet 1   Vitamin D-Vitamin K (VITAMIN K2-VITAMIN D3 PO) Take 1 tablet by mouth daily.     No current facility-administered medications on file prior to visit.    ROS: See HPI  Observations/Objective: Awake, alert, oriented x3 Neck-no JVD Respiration-not in acute distress Extremities-F ROM Psych-dysphoric mood, teary  CMP Latest Ref Rng & Units 12/25/2020 12/16/2020 08/09/2020  Glucose 70 - 99 mg/dL 130(H) 99 98  BUN 8 - 23 mg/dL 25(H) 24(H) 23  Creatinine 0.44 - 1.00 mg/dL 1.17(H) 1.22(H) 1.16(H)  Sodium 135 - 145 mmol/L 140 139  142  Potassium 3.5 - 5.1 mmol/L 3.8 3.9 3.6  Chloride 98 - 111 mmol/L 106 103 107  CO2 22 - 32 mmol/L '25 27 24  '$ Calcium 8.9 - 10.3 mg/dL 8.3(L) 9.1 9.1  Total Protein 6.0 - 8.5 g/dL - - -  Total Bilirubin 0.0 - 1.2 mg/dL - - -  Alkaline Phos 48 - 121 IU/L - - -  AST 0 - 40 IU/L - - -  ALT 0 - 32 IU/L - - -    Lipid Panel     Component Value Date/Time   CHOL 157 03/30/2020 1139   TRIG 194 (H) 03/30/2020 1139   HDL 47 03/30/2020 1139   CHOLHDL 3.3 03/30/2020 1139   LDLCALC 77  03/30/2020 1139   LABVLDL 33 03/30/2020 1139    Lab Results  Component Value Date   HGBA1C 5.8 (A) 12/01/2020     Assessment and Plan: 1. Anxiety and depression Triggered by recent bereavement and stresses of recent surgeries We will add on hydroxyzine Continue Lexapro Advised to reach out to hospice for grief counseling - hydrOXYzine (ATARAX/VISTARIL) 25 MG tablet; Take 1 tablet (25 mg total) by mouth at bedtime as needed.  Dispense: 30 tablet; Refill: 1  2. Restless legs Chronic, previously stable Symptoms likely triggered by new onset anxiety Hopefully treatment of anxiety should bring about resolution If symptoms persist will need to reassess for additional treatment   Follow Up Instructions: Patient to call back if symptoms are uncontrolled   I discussed the assessment and treatment plan with the patient. The patient was provided an opportunity to ask questions and all were answered. The patient agreed with the plan and demonstrated an understanding of the instructions.   The patient was advised to call back or seek an in-person evaluation if the symptoms worsen or if the condition fails to improve as anticipated.     I provided 14 minutes total of Telehealth time during this encounter including median intraservice time, reviewing previous notes, investigations, ordering medications, medical decision making, coordinating care and patient verbalized understanding at the end of the visit.     Charlott Rakes, MD, FAAFP. [facility] and [place], [place]   02/10/2021, 11:21 AM

## 2021-02-14 ENCOUNTER — Ambulatory Visit: Payer: Medicaid Other

## 2021-02-14 ENCOUNTER — Other Ambulatory Visit: Payer: Self-pay

## 2021-02-14 DIAGNOSIS — M25561 Pain in right knee: Secondary | ICD-10-CM

## 2021-02-14 DIAGNOSIS — R262 Difficulty in walking, not elsewhere classified: Secondary | ICD-10-CM | POA: Diagnosis not present

## 2021-02-14 DIAGNOSIS — M25661 Stiffness of right knee, not elsewhere classified: Secondary | ICD-10-CM

## 2021-02-14 DIAGNOSIS — M6281 Muscle weakness (generalized): Secondary | ICD-10-CM | POA: Diagnosis not present

## 2021-02-14 NOTE — Therapy (Signed)
[place], [place], [address] Phone: [phone]   Fax:  [phone]  Physical Therapy Treatment  Patient Details  Name: Denise Morales MRN: [id number] Date of Birth: 09/01/1960 Referring Provider (PT): Mcarthur Rossetti, MD   Encounter Date: 02/14/2021   PT End of Session - 02/14/21 1103     Visit Number 10    Number of Visits 17    Date for PT Re-Evaluation 03/12/21    Authorization Type [employer]    Authorization Time Period 7/25-9/17/22    Authorization - Visit Number 9    Authorization - Number of Visits 16    PT Start Time 1101    PT Stop Time 4562    PT Time Calculation (min) 42 min    Activity Tolerance Patient tolerated treatment well    Behavior During Therapy WFL for tasks assessed/performed             Past Medical History:  Diagnosis Date   Depression    GERD (gastroesophageal reflux disease)    Hypertension     Past Surgical History:  Procedure Laterality Date   ABDOMINAL HYSTERECTOMY  1985   CHOLECYSTECTOMY     SHOULDER ARTHROSCOPY WITH ROTATOR CUFF REPAIR AND SUBACROMIAL DECOMPRESSION Right 08/12/2020   Procedure: RIGHT SHOULDER ARTHROSCOPY WITH ROTATOR CUFF REPAIR AND SUBACROMIAL DECOMPRESSION;  Surgeon: Mcarthur Rossetti, MD;  Location: [facility];  Service: Orthopedics;  Laterality: Right;   TOTAL KNEE ARTHROPLASTY Right 12/24/2020   Procedure: RIGHT TOTAL KNEE ARTHROPLASTY;  Surgeon: Mcarthur Rossetti, MD;  Location: [facility];  Service: Orthopedics;  Laterality: Right;    There were no vitals filed for this visit.   Subjective Assessment - 02/14/21 1103     Subjective Patient reports both knees are feeling good currently. No pain currently.    Patient Stated Goals To get away from the cane and be normal again, work in my yard    Currently in Pain? No/denies                [facility] PT Assessment - 02/14/21 0001       AROM    Right Knee Flexion 121                           [facility] Adult PT Treatment/Exercise - 02/14/21 0001       Neuro Re-ed    Neuro Re-ed Details  tandem walks 2 x 15 ft, ankle rockerboard A/P 20 reps, marching on airex 20 reps      Knee/Hip Exercises: Stretches   Sports administrator 30 seconds    Quad Stretch Limitations x2; strap      Knee/Hip Exercises: Aerobic   Nustep L6 x 5 min UE/LE      Knee/Hip Exercises: Standing   Hip Abduction 10 reps    Abduction Limitations red band x2; bilateral    Hip Extension --    Extension Limitations --    Other Standing Knee Exercises backwards walking 2 x 15 ft      Knee/Hip Exercises: Seated   Sit to Sand 2 sets;10 reps   5 lb Kettlebell     Knee/Hip Exercises: Sidelying   Clams 2x  10 bilateral green band                      PT Short Term Goals - 02/02/21 1101       PT  SHORT TERM GOAL #1   Title Patient will be independent with initial HEP.    Time 2    Period Weeks    Status Achieved    Target Date 01/25/21      PT SHORT TERM GOAL #2   Title Patient will demonstrate at least 100 degrees of Rt knee flexion AROM to improve ability to complete sit to stand.    Baseline 103 8/2    Time 4    Period Weeks    Status Achieved    Target Date 02/08/21      PT SHORT TERM GOAL #3   Title Patient will demonstrate full Rt knee extension to improve gait pattern.    Baseline 0 8/2    Time 4    Period Weeks    Status Achieved    Target Date 02/08/21      PT SHORT TERM GOAL #4   Title Patient will be able to ascend/descend stairs with step to pattern and use of handrails in order to return to her home.    Baseline no issues with stairs, has returned home.    Time 4    Period Weeks    Status Achieved    Target Date 02/08/21               PT Long Term Goals - 02/14/21 1115       PT LONG TERM GOAL #1   Title Patient will demonstrate at least 4+/5 Rt knee strength to improve stability necessary for  prolonged walking activity.    Baseline n/a due to post-op acuity    Time 8    Period Weeks    Status Deferred      PT LONG TERM GOAL #2   Title Patient will demonstrate at least 120 degrees of Rt knee flexion AROM in order to improve ability to complete squatting/bending activity necessary for gardening.    Baseline 85    Time 8    Period Weeks    Status Achieved      PT LONG TERM GOAL #3   Title Patient will demonstrate normalized gait mechanics with LRAD.    Baseline excessive lateral trunk flexion    Time 8    Period Weeks    Status On-going      PT LONG TERM GOAL #4   Title Patient will complete 5 x STS in less than 12 seconds to signify improvements in overall functional strength.    Baseline 19.7    Time 8    Period Weeks    Status Deferred      PT LONG TERM GOAL #5   Title Patient will complete TUG in less than 15 seconds to decrease her risk of falls.    Baseline 24    Time 8    Period Weeks    Status Deferred      PT LONG TERM GOAL #6   Title Patient will maintain Rt SLS for at least 10 seconds to improve her overall stability when walking on uneven terrain.    Baseline 2 seconds; 26 seconds 8/17    Time 8    Period Weeks    Status Achieved                   Plan - 02/14/21 1107     Clinical Impression Statement Denise Morales has achieved functional knee flexion AROM in the Rt knee having met this LTG. She continues to be challenged with tandem walking requiring  occasional implementation of reaching and stepping strategy to maintain her balance. Her balance is gradually improving during balance training on unstable surface requiring less frequent need for UE support. Rt lateral knee pain with clamshells, otherwise no complaints of Rt knee pain. Occasional complaints of Lt knee pain throughout session, though overall she tolerated session well.    PT Treatment/Interventions ADLs/Self Care Home Management;Cryotherapy;Moist Heat;Gait training;Stair  training;Therapeutic activities;Therapeutic exercise;Balance training;Neuromuscular re-education;Patient/family education;Manual techniques;Passive range of motion;Dry needling;Taping    PT Next Visit Plan hip/knee strengthening, balance training    PT Home Exercise Plan Access Code [email or web address]    Consulted and Agree with Plan of Care Patient             Patient will benefit from skilled therapeutic intervention in order to improve the following deficits and impairments:  Abnormal gait, Decreased range of motion, Difficulty walking, Decreased activity tolerance, Pain, Decreased balance, Decreased strength  Visit Diagnosis: Acute pain of right knee  Stiffness of right knee, not elsewhere classified  Muscle weakness (generalized)  Difficulty in walking, not elsewhere classified     Problem List Patient Active Problem List   Diagnosis Date Noted   Status post right knee replacement 12/24/2020   Unilateral primary osteoarthritis, left knee 11/11/2020   Unilateral primary osteoarthritis, right knee 11/11/2020   Complete tear of right rotator cuff 08/12/2020   Hypertensive disorder 05/05/2019   Gwendolyn Grant, PT, DPT, ATC 02/14/21 11:45 AM   [facility] [address] [place], [place], [address] Phone: [phone]   Fax:  [phone]  Name: Denise Morales MRN: [id number] Date of Birth: Feb 04, 1961

## 2021-02-16 ENCOUNTER — Other Ambulatory Visit: Payer: Self-pay | Admitting: Family Medicine

## 2021-02-16 ENCOUNTER — Ambulatory Visit: Payer: Medicaid Other

## 2021-02-17 ENCOUNTER — Other Ambulatory Visit: Payer: Self-pay | Admitting: Orthopaedic Surgery

## 2021-02-17 ENCOUNTER — Telehealth: Payer: Self-pay | Admitting: Orthopaedic Surgery

## 2021-02-17 MED ORDER — ROPINIROLE HCL 0.25 MG PO TABS: 0.2500 mg | ORAL_TABLET | Freq: Every day | ORAL | 0 refills | Status: DC

## 2021-02-17 NOTE — Telephone Encounter (Signed)
Patient aware this was called in for her  

## 2021-02-17 NOTE — Telephone Encounter (Signed)
Pt came in stating she has a restless leg and she has an appt for 03/03/21 but she states it's getting a lot worse. Pt stated at first it only affected her at night but as it continually gets worse the pt is starting to have anxiety. Pt would like a CB to discuss if there's anything that can be done prior to 03/03/21?  [phone]

## 2021-02-18 ENCOUNTER — Telehealth: Payer: Self-pay | Admitting: Orthopaedic Surgery

## 2021-02-18 NOTE — Telephone Encounter (Signed)
Pt called and states [facility] pharmacy needs more information on medication that was sent in. (rOPINIRole (REQUIP)

## 2021-02-21 ENCOUNTER — Telehealth: Payer: Self-pay | Admitting: Orthopaedic Surgery

## 2021-02-21 ENCOUNTER — Ambulatory Visit: Payer: Medicaid Other

## 2021-02-21 ENCOUNTER — Other Ambulatory Visit: Payer: Self-pay

## 2021-02-21 DIAGNOSIS — M6281 Muscle weakness (generalized): Secondary | ICD-10-CM

## 2021-02-21 DIAGNOSIS — M25661 Stiffness of right knee, not elsewhere classified: Secondary | ICD-10-CM | POA: Diagnosis not present

## 2021-02-21 DIAGNOSIS — M25561 Pain in right knee: Secondary | ICD-10-CM | POA: Diagnosis not present

## 2021-02-21 DIAGNOSIS — R262 Difficulty in walking, not elsewhere classified: Secondary | ICD-10-CM

## 2021-02-21 NOTE — Telephone Encounter (Signed)
Pharmacy aware direction in chart are correct

## 2021-02-21 NOTE — Therapy (Signed)
[place], [place], [address] Phone: [phone]   Fax:  [phone]  Physical Therapy Treatment  Patient Details  Name: Denise Morales MRN: [id number] Date of Birth: 1960-07-05 Referring Provider (PT): Mcarthur Rossetti, MD   Encounter Date: 02/21/2021   PT End of Session - 02/21/21 1058     Visit Number 11    Number of Visits 17    Date for PT Re-Evaluation 03/12/21    Authorization Type [employer]    Authorization Time Period 7/25-9/17/22    Authorization - Visit Number 10    Authorization - Number of Visits 16    PT Start Time 1058    PT Stop Time 1141    PT Time Calculation (min) 43 min    Activity Tolerance Patient tolerated treatment well    Behavior During Therapy WFL for tasks assessed/performed             Past Medical History:  Diagnosis Date   Depression    GERD (gastroesophageal reflux disease)    Hypertension     Past Surgical History:  Procedure Laterality Date   ABDOMINAL HYSTERECTOMY  1985   CHOLECYSTECTOMY     SHOULDER ARTHROSCOPY WITH ROTATOR CUFF REPAIR AND SUBACROMIAL DECOMPRESSION Right 08/12/2020   Procedure: RIGHT SHOULDER ARTHROSCOPY WITH ROTATOR CUFF REPAIR AND SUBACROMIAL DECOMPRESSION;  Surgeon: Mcarthur Rossetti, MD;  Location: [facility];  Service: Orthopedics;  Laterality: Right;   TOTAL KNEE ARTHROPLASTY Right 12/24/2020   Procedure: RIGHT TOTAL KNEE ARTHROPLASTY;  Surgeon: Mcarthur Rossetti, MD;  Location: [facility];  Service: Orthopedics;  Laterality: Right;    There were no vitals filed for this visit.   Subjective Assessment - 02/21/21 1058     Subjective "The right one is doing good. The left one is bothering me today. I didn't do a lot of exercises because I was sick."    Patient Stated Goals To get away from the cane and be normal again, work in my yard    Currently in Pain? Yes    Pain Score 5     Pain Location  Knee    Pain Orientation Left;Anterior    Pain Descriptors / Indicators Aching    Pain Type Chronic pain    Pain Onset More than a month ago    Pain Frequency Constant                [facility] PT Assessment - 02/21/21 0001       Timed Up and Go Test   Normal TUG (seconds) 9.6                           [facility] Adult PT Treatment/Exercise - 02/21/21 0001       Neuro Re-ed    Neuro Re-ed Details  SL cone tap 2 x 10 each; romberg on foam eyes closed 3  x 30 sec, tandem forward 2 x 15 ft, backward 2 x 10 ft      Knee/Hip Exercises: Stretches   Sports administrator 60 seconds    Quad Stretch Limitations bilateral with strap      Knee/Hip Exercises: Aerobic   Nustep L6 x 5 min UE/LE      Knee/Hip Exercises: Standing   Heel Raises 10 reps    Heel Raises Limitations x2; SL bilateral    Other Standing Knee Exercises 3 way hip 1 x 10 bilateral  Knee/Hip Exercises: Sidelying   Hip ABduction 10 reps;2 sets    Hip ABduction Limitations 2 lbs; x2 bilateral                      PT Short Term Goals - 02/02/21 1101       PT SHORT TERM GOAL #1   Title Patient will be independent with initial HEP.    Time 2    Period Weeks    Status Achieved    Target Date 01/25/21      PT SHORT TERM GOAL #2   Title Patient will demonstrate at least 100 degrees of Rt knee flexion AROM to improve ability to complete sit to stand.    Baseline 103 8/2    Time 4    Period Weeks    Status Achieved    Target Date 02/08/21      PT SHORT TERM GOAL #3   Title Patient will demonstrate full Rt knee extension to improve gait pattern.    Baseline 0 8/2    Time 4    Period Weeks    Status Achieved    Target Date 02/08/21      PT SHORT TERM GOAL #4   Title Patient will be able to ascend/descend stairs with step to pattern and use of handrails in order to return to her home.    Baseline no issues with stairs, has returned home.    Time 4    Period Weeks    Status Achieved     Target Date 02/08/21               PT Long Term Goals - 02/21/21 1109       PT LONG TERM GOAL #1   Title Patient will demonstrate at least 4+/5 Rt knee strength to improve stability necessary for prolonged walking activity.    Baseline n/a due to post-op acuity    Time 8    Period Weeks    Status Deferred      PT LONG TERM GOAL #2   Title Patient will demonstrate at least 120 degrees of Rt knee flexion AROM in order to improve ability to complete squatting/bending activity necessary for gardening.    Baseline 85    Time 8    Period Weeks    Status Achieved      PT LONG TERM GOAL #3   Title Patient will demonstrate normalized gait mechanics with LRAD.    Baseline excessive lateral trunk flexion    Time 8    Period Weeks    Status On-going      PT LONG TERM GOAL #4   Title Patient will complete 5 x STS in less than 12 seconds to signify improvements in overall functional strength.    Baseline 19.7    Time 8    Period Weeks    Status Deferred      PT LONG TERM GOAL #5   Title Patient will complete TUG in less than 15 seconds to decrease her risk of falls.    Baseline 9.6    Time 8    Period Weeks    Status Achieved      PT LONG TERM GOAL #6   Title Patient will maintain Rt SLS for at least 10 seconds to improve her overall stability when walking on uneven terrain.    Baseline 2 seconds; 26 seconds 8/17    Time 8    Period Weeks  Status Achieved                   Plan - 02/21/21 1105     Clinical Impression Statement Leon arrives with moderate pain about the left anterior knee. Her TUG score has much improved compared to baseline scoring at a low fall risk based upon her score today. Able to progress dynamic balance activity with patient less apprehensive compared to previous session and requiring minimal implementation of reaching strategy to maintain balance. Moderate sway during balance actiivty with eyes closed. Forward tandem walking has much  improved with occasional implementation of stepping strategy to maintain balance.    PT Treatment/Interventions ADLs/Self Care Home Management;Cryotherapy;Moist Heat;Gait training;Stair training;Therapeutic activities;Therapeutic exercise;Balance training;Neuromuscular re-education;Patient/family education;Manual techniques;Passive range of motion;Dry needling;Taping    PT Next Visit Plan hip/knee strengthening, balance training    PT Home Exercise Plan Access Code [email or web address]    Consulted and Agree with Plan of Care Patient             Patient will benefit from skilled therapeutic intervention in order to improve the following deficits and impairments:  Abnormal gait, Decreased range of motion, Difficulty walking, Decreased activity tolerance, Pain, Decreased balance, Decreased strength  Visit Diagnosis: Acute pain of right knee  Stiffness of right knee, not elsewhere classified  Muscle weakness (generalized)  Difficulty in walking, not elsewhere classified     Problem List Patient Active Problem List   Diagnosis Date Noted   Status post right knee replacement 12/24/2020   Unilateral primary osteoarthritis, left knee 11/11/2020   Unilateral primary osteoarthritis, right knee 11/11/2020   Complete tear of right rotator cuff 08/12/2020   Hypertensive disorder 05/05/2019   Gwendolyn Grant, PT, DPT, ATC 02/21/21 11:42 AM   [facility] [address] [place], [place], [address] Phone: [phone]   Fax:  [phone]  Name: Kyre Krantz MRN: [id number] Date of Birth: 09-15-1960

## 2021-02-21 NOTE — Telephone Encounter (Signed)
Pt called and states [facility] has been trying to call and get in touch with someone about pt medication (requip)  she was prescribed. They need to know why pt is taking this medication.   CB [phone]

## 2021-02-23 ENCOUNTER — Ambulatory Visit: Payer: Medicaid Other

## 2021-02-23 ENCOUNTER — Other Ambulatory Visit: Payer: Self-pay

## 2021-02-23 DIAGNOSIS — M25561 Pain in right knee: Secondary | ICD-10-CM

## 2021-02-23 DIAGNOSIS — M25661 Stiffness of right knee, not elsewhere classified: Secondary | ICD-10-CM | POA: Diagnosis not present

## 2021-02-23 DIAGNOSIS — R262 Difficulty in walking, not elsewhere classified: Secondary | ICD-10-CM

## 2021-02-23 DIAGNOSIS — M6281 Muscle weakness (generalized): Secondary | ICD-10-CM

## 2021-02-23 NOTE — Therapy (Signed)
[place], [place], [address] Phone: [phone]   Fax:  [phone]  Physical Therapy Treatment  Patient Details  Name: Denise Morales MRN: [id number] Date of Birth: 06-Sep-1960 Referring Provider (PT): Mcarthur Rossetti, MD   Encounter Date: 02/23/2021   PT End of Session - 02/23/21 1100     Visit Number 12    Number of Visits 17    Date for PT Re-Evaluation 03/12/21    Authorization Type [employer]    Authorization Time Period 7/25-9/17/22    Authorization - Visit Number 11    Authorization - Number of Visits 16    PT Start Time 1100    PT Stop Time 1140    PT Time Calculation (min) 40 min    Activity Tolerance Patient tolerated treatment well    Behavior During Therapy WFL for tasks assessed/performed             Past Medical History:  Diagnosis Date   Depression    GERD (gastroesophageal reflux disease)    Hypertension     Past Surgical History:  Procedure Laterality Date   ABDOMINAL HYSTERECTOMY  1985   CHOLECYSTECTOMY     SHOULDER ARTHROSCOPY WITH ROTATOR CUFF REPAIR AND SUBACROMIAL DECOMPRESSION Right 08/12/2020   Procedure: RIGHT SHOULDER ARTHROSCOPY WITH ROTATOR CUFF REPAIR AND SUBACROMIAL DECOMPRESSION;  Surgeon: Mcarthur Rossetti, MD;  Location: [facility];  Service: Orthopedics;  Laterality: Right;   TOTAL KNEE ARTHROPLASTY Right 12/24/2020   Procedure: RIGHT TOTAL KNEE ARTHROPLASTY;  Surgeon: Mcarthur Rossetti, MD;  Location: [facility];  Service: Orthopedics;  Laterality: Right;    There were no vitals filed for this visit.   Subjective Assessment - 02/23/21 1100     Subjective No pain in the right knee.Left one feels the same.    Patient Stated Goals To get away from the cane and be normal again, work in my yard    Currently in Pain? Yes    Pain Score 4     Pain Location Knee    Pain Orientation Left;Anterior    Pain Descriptors /  Indicators Aching    Pain Type Chronic pain    Pain Onset More than a month ago    Pain Frequency Constant    Aggravating Factors  walking, standing    Pain Relieving Factors rest                               [facility] Adult PT Treatment/Exercise - 02/23/21 0001       Knee/Hip Exercises: Stretches   Sports administrator 60 seconds    Quad Stretch Limitations bilateral with strap      Knee/Hip Exercises: Aerobic   Nustep L6 x 5 min UE/LE      Knee/Hip Exercises: Machines for Strengthening   Cybex Knee Extension 2 x10 @ 5 lbs RLE; attempted 1 rep bilateral, unable due to Lt knee pain      Knee/Hip Exercises: Standing   Other Standing Knee Exercises forward and lateral hurdle step overs in // bars 4 sets d/b    Other Standing Knee Exercises stair negotation 1 trial reciprocal; 3 way hip airex 1 x 10 each                      PT Short Term Goals - 02/02/21 1101       PT SHORT TERM GOAL #  1   Title Patient will be independent with initial HEP.    Time 2    Period Weeks    Status Achieved    Target Date 01/25/21      PT SHORT TERM GOAL #2   Title Patient will demonstrate at least 100 degrees of Rt knee flexion AROM to improve ability to complete sit to stand.    Baseline 103 8/2    Time 4    Period Weeks    Status Achieved    Target Date 02/08/21      PT SHORT TERM GOAL #3   Title Patient will demonstrate full Rt knee extension to improve gait pattern.    Baseline 0 8/2    Time 4    Period Weeks    Status Achieved    Target Date 02/08/21      PT SHORT TERM GOAL #4   Title Patient will be able to ascend/descend stairs with step to pattern and use of handrails in order to return to her home.    Baseline no issues with stairs, has returned home.    Time 4    Period Weeks    Status Achieved    Target Date 02/08/21               PT Long Term Goals - 02/21/21 1109       PT LONG TERM GOAL #1   Title Patient will demonstrate at least 4+/5  Rt knee strength to improve stability necessary for prolonged walking activity.    Baseline n/a due to post-op acuity    Time 8    Period Weeks    Status Deferred      PT LONG TERM GOAL #2   Title Patient will demonstrate at least 120 degrees of Rt knee flexion AROM in order to improve ability to complete squatting/bending activity necessary for gardening.    Baseline 85    Time 8    Period Weeks    Status Achieved      PT LONG TERM GOAL #3   Title Patient will demonstrate normalized gait mechanics with LRAD.    Baseline excessive lateral trunk flexion    Time 8    Period Weeks    Status On-going      PT LONG TERM GOAL #4   Title Patient will complete 5 x STS in less than 12 seconds to signify improvements in overall functional strength.    Baseline 19.7    Time 8    Period Weeks    Status Deferred      PT LONG TERM GOAL #5   Title Patient will complete TUG in less than 15 seconds to decrease her risk of falls.    Baseline 9.6    Time 8    Period Weeks    Status Achieved      PT LONG TERM GOAL #6   Title Patient will maintain Rt SLS for at least 10 seconds to improve her overall stability when walking on uneven terrain.    Baseline 2 seconds; 26 seconds 8/17    Time 8    Period Weeks    Status Achieved                   Plan - 02/23/21 1103     Clinical Impression Statement With forward hurdle step overs she initially demonstrates circumduction swing on the RLE, though with continued practice she is able to allow for appropriate hip/knee flexion. Occasional  LOB with step overs requiring reaching strategy to maintain balance. She was able to perform reciprocal stepping on the stairs, though reports increased pain in the left knee, so was encouraged to continue with step to pattern to avoid further aggravation of the Lt knee. With resisted knee extension this caused more Lt knee pain, so performed on the Rt knee only.    PT Treatment/Interventions ADLs/Self Care  Home Management;Cryotherapy;Moist Heat;Gait training;Stair training;Therapeutic activities;Therapeutic exercise;Balance training;Neuromuscular re-education;Patient/family education;Manual techniques;Passive range of motion;Dry needling;Taping    PT Next Visit Plan hip/knee strengthening, balance training    PT Home Exercise Plan Access Code [email or web address]    Consulted and Agree with Plan of Care Patient             Patient will benefit from skilled therapeutic intervention in order to improve the following deficits and impairments:  Abnormal gait, Decreased range of motion, Difficulty walking, Decreased activity tolerance, Pain, Decreased balance, Decreased strength  Visit Diagnosis: Acute pain of right knee  Stiffness of right knee, not elsewhere classified  Muscle weakness (generalized)  Difficulty in walking, not elsewhere classified     Problem List Patient Active Problem List   Diagnosis Date Noted   Status post right knee replacement 12/24/2020   Unilateral primary osteoarthritis, left knee 11/11/2020   Unilateral primary osteoarthritis, right knee 11/11/2020   Complete tear of right rotator cuff 08/12/2020   Hypertensive disorder 05/05/2019   Gwendolyn Grant, PT, DPT, ATC 02/23/21 11:41 AM  [facility] [address] [place], [place], [address] Phone: [phone]   Fax:  [phone]  Name: Denise Morales MRN: [id number] Date of Birth: 1961/02/15

## 2021-02-25 ENCOUNTER — Telehealth: Payer: Self-pay | Admitting: Orthopaedic Surgery

## 2021-02-25 NOTE — Telephone Encounter (Signed)
Pt called requesting a call back about medication intake. Please call pt at [phone]. Pt states she is confused

## 2021-03-01 ENCOUNTER — Ambulatory Visit: Payer: Medicaid Other | Attending: Orthopaedic Surgery

## 2021-03-01 ENCOUNTER — Other Ambulatory Visit: Payer: Self-pay

## 2021-03-01 DIAGNOSIS — M25561 Pain in right knee: Secondary | ICD-10-CM

## 2021-03-01 DIAGNOSIS — M25661 Stiffness of right knee, not elsewhere classified: Secondary | ICD-10-CM

## 2021-03-01 DIAGNOSIS — M6281 Muscle weakness (generalized): Secondary | ICD-10-CM | POA: Diagnosis not present

## 2021-03-01 DIAGNOSIS — R262 Difficulty in walking, not elsewhere classified: Secondary | ICD-10-CM

## 2021-03-01 NOTE — Therapy (Signed)
[place], [place], [address] Phone: [phone]   Fax:  [phone]  Physical Therapy Treatment  Patient Details  Name: Denise Morales MRN: [id number] Date of Birth: 1961/01/23 Referring Provider (PT): Mcarthur Rossetti, MD   Encounter Date: 03/01/2021   PT End of Session - 03/01/21 1059     Visit Number 13    Number of Visits 17    Date for PT Re-Evaluation 03/12/21    Authorization Type [employer]    Authorization Time Period 7/25-9/17/22    Authorization - Visit Number 12    Authorization - Number of Visits 16    PT Start Time 1059    PT Stop Time [id number]    PT Time Calculation (min) 43 min    Activity Tolerance Patient tolerated treatment well;Patient limited by pain    Behavior During Therapy WFL for tasks assessed/performed             Past Medical History:  Diagnosis Date   Depression    GERD (gastroesophageal reflux disease)    Hypertension     Past Surgical History:  Procedure Laterality Date   ABDOMINAL HYSTERECTOMY  1985   CHOLECYSTECTOMY     SHOULDER ARTHROSCOPY WITH ROTATOR CUFF REPAIR AND SUBACROMIAL DECOMPRESSION Right 08/12/2020   Procedure: RIGHT SHOULDER ARTHROSCOPY WITH ROTATOR CUFF REPAIR AND SUBACROMIAL DECOMPRESSION;  Surgeon: Mcarthur Rossetti, MD;  Location: [facility];  Service: Orthopedics;  Laterality: Right;   TOTAL KNEE ARTHROPLASTY Right 12/24/2020   Procedure: RIGHT TOTAL KNEE ARTHROPLASTY;  Surgeon: Mcarthur Rossetti, MD;  Location: [facility];  Service: Orthopedics;  Laterality: Right;    There were no vitals filed for this visit.   Subjective Assessment - 03/01/21 1100     Subjective Patient reports the right knee is feeling ok. The left one is still bothering her.    Patient Stated Goals To get away from the cane and be normal again, work in my yard    Currently in Pain? Yes    Pain Score 5     Pain Location Knee     Pain Orientation Left;Anterior    Pain Descriptors / Indicators Aching    Pain Type Chronic pain    Pain Onset More than a month ago    Pain Frequency Constant    Aggravating Factors  walking, standing    Pain Relieving Factors rest                [facility] PT Assessment - 03/01/21 0001       AROM   Right Knee Flexion 124    Left Knee Flexion 135                           [facility] Adult PT Treatment/Exercise - 03/01/21 0001       Self-Care   Other Self-Care Comments  see patient education      Knee/Hip Exercises: Public affairs consultant 60 seconds    Quad Stretch Limitations bilateral with strap      Knee/Hip Exercises: Aerobic   Nustep L5 x 5 min UE/LE      Knee/Hip Exercises: Machines for Strengthening   Total Gym Leg Press SL leg press RLE 2 x 10 @ 20 lbs      Knee/Hip Exercises: Standing   Forward Step Up 10 reps;Step Height: 8";2 sets    Forward Step Up Limitations RLE; attempted LLE unable  due to pain.      Knee/Hip Exercises: Supine   Bridges Limitations 2x6 bridge to knee extension RLE      Knee/Hip Exercises: Sidelying   Other Sidelying Knee/Hip Exercises hip circles CW/CCW 2 x 10 bilateral                    PT Education - 03/01/21 1138     Education Details Reviewed/Updated HEP    Person(s) Educated Patient    Methods Explanation;Demonstration;Handout;Verbal cues    Comprehension Verbalized understanding;Returned demonstration              PT Short Term Goals - 02/02/21 1101       PT SHORT TERM GOAL #1   Title Patient will be independent with initial HEP.    Time 2    Period Weeks    Status Achieved    Target Date 01/25/21      PT SHORT TERM GOAL #2   Title Patient will demonstrate at least 100 degrees of Rt knee flexion AROM to improve ability to complete sit to stand.    Baseline 103 8/2    Time 4    Period Weeks    Status Achieved    Target Date 02/08/21      PT SHORT TERM GOAL #3   Title Patient  will demonstrate full Rt knee extension to improve gait pattern.    Baseline 0 8/2    Time 4    Period Weeks    Status Achieved    Target Date 02/08/21      PT SHORT TERM GOAL #4   Title Patient will be able to ascend/descend stairs with step to pattern and use of handrails in order to return to her home.    Baseline no issues with stairs, has returned home.    Time 4    Period Weeks    Status Achieved    Target Date 02/08/21               PT Long Term Goals - 02/21/21 1109       PT LONG TERM GOAL #1   Title Patient will demonstrate at least 4+/5 Rt knee strength to improve stability necessary for prolonged walking activity.    Baseline n/a due to post-op acuity    Time 8    Period Weeks    Status Deferred      PT LONG TERM GOAL #2   Title Patient will demonstrate at least 120 degrees of Rt knee flexion AROM in order to improve ability to complete squatting/bending activity necessary for gardening.    Baseline 85    Time 8    Period Weeks    Status Achieved      PT LONG TERM GOAL #3   Title Patient will demonstrate normalized gait mechanics with LRAD.    Baseline excessive lateral trunk flexion    Time 8    Period Weeks    Status On-going      PT LONG TERM GOAL #4   Title Patient will complete 5 x STS in less than 12 seconds to signify improvements in overall functional strength.    Baseline 19.7    Time 8    Period Weeks    Status Deferred      PT LONG TERM GOAL #5   Title Patient will complete TUG in less than 15 seconds to decrease her risk of falls.    Baseline 9.6    Time 8  Period Weeks    Status Achieved      PT LONG TERM GOAL #6   Title Patient will maintain Rt SLS for at least 10 seconds to improve her overall stability when walking on uneven terrain.    Baseline 2 seconds; 26 seconds 8/17    Time 8    Period Weeks    Status Achieved                   Plan - 03/01/21 1106     Clinical Impression Statement Denise Morales continues to  report moderate pain about the Lt knee, though the Rt knee continues to do well without pain. Rt knee flexion AROM continues to gradually improving achieving 124 degrees today. She tolerated CKC strengthening well on the RLE, though unable to perform on the LLE due to increased pain. Reviewed and updated HEP to include [place] strengthening exercises that she can tolerate on the LLE given that CKC strengthening is exacerbating her pain.    PT Treatment/Interventions ADLs/Self Care Home Management;Cryotherapy;Moist Heat;Gait training;Stair training;Therapeutic activities;Therapeutic exercise;Balance training;Neuromuscular re-education;Patient/family education;Manual techniques;Passive range of motion;Dry needling;Taping    PT Next Visit Plan hip/knee strengthening, balance training    PT Home Exercise Plan Access Code [email or web address]    Consulted and Agree with Plan of Care Patient             Patient will benefit from skilled therapeutic intervention in order to improve the following deficits and impairments:  Abnormal gait, Decreased range of motion, Difficulty walking, Decreased activity tolerance, Pain, Decreased balance, Decreased strength  Visit Diagnosis: Acute pain of right knee  Stiffness of right knee, not elsewhere classified  Muscle weakness (generalized)  Difficulty in walking, not elsewhere classified     Problem List Patient Active Problem List   Diagnosis Date Noted   Status post right knee replacement 12/24/2020   Unilateral primary osteoarthritis, left knee 11/11/2020   Unilateral primary osteoarthritis, right knee 11/11/2020   Complete tear of right rotator cuff 08/12/2020   Hypertensive disorder 05/05/2019   Gwendolyn Grant, PT, DPT, ATC 03/01/21 11:43 AM  [facility] [address] [place], [place], [address] Phone: [phone]   Fax:  [phone]  Name: Denise Morales MRN: [id number] Date of Birth:  [birth year]

## 2021-03-03 ENCOUNTER — Other Ambulatory Visit: Payer: Self-pay

## 2021-03-03 ENCOUNTER — Ambulatory Visit: Payer: Medicaid Other

## 2021-03-03 ENCOUNTER — Ambulatory Visit (INDEPENDENT_AMBULATORY_CARE_PROVIDER_SITE_OTHER): Payer: Medicaid Other | Admitting: Orthopaedic Surgery

## 2021-03-03 ENCOUNTER — Encounter: Payer: Self-pay | Admitting: Orthopaedic Surgery

## 2021-03-03 DIAGNOSIS — M6281 Muscle weakness (generalized): Secondary | ICD-10-CM

## 2021-03-03 DIAGNOSIS — R262 Difficulty in walking, not elsewhere classified: Secondary | ICD-10-CM | POA: Diagnosis not present

## 2021-03-03 DIAGNOSIS — M25561 Pain in right knee: Secondary | ICD-10-CM

## 2021-03-03 DIAGNOSIS — M25661 Stiffness of right knee, not elsewhere classified: Secondary | ICD-10-CM

## 2021-03-03 DIAGNOSIS — Z96651 Presence of right artificial knee joint: Secondary | ICD-10-CM

## 2021-03-03 NOTE — Progress Notes (Signed)
The patient is now 9 weeks status post a right total knee arthroplasty.  She is doing great and is ahead of majority of people.  Her range of motion is entirely full of the right knee with full flexion and extension and no significant swelling.  The knee is ligamentously stable.  Her left knee does have known arthritis in the left knee.  We have tried a steroid injection and she is worked on Forensic scientist on the left knee as well as anti-inflammatories and other modalities to try to get the left knee feeling better.  The neck step for her left knee will be ordering hyaluronic acid for that knee given the failure of other conservative treatment measures including steroid injection.  She does have moderate arthritis of the left knee.  On exam her right knee is again doing great.  The range of motion is full and stable ligamentously.  Incisions well-healed.  Her left knee shows pain along the medial joint line and the patellofemoral joint.  There is a palpable Baker's cyst as well.  We will order hyaluronic acid and hopefully get this approved for a left knee and try this before considering knee replacement surgery since that did help her right knee in the past for some time.  All questions and concerns were answered and addressed.

## 2021-03-03 NOTE — Therapy (Signed)
[name], [place], [address] Phone: [phone]   Fax:  [phone]  Physical Therapy Treatment  Patient Details  Name: Denise Morales MRN: [id number] Date of Birth: 19-Oct-1960 Referring Provider (PT): Denise Rossetti, MD   Encounter Date: 03/03/2021   PT End of Session - 03/03/21 1100     Visit Number 14    Number of Visits 17    Date for PT Re-Evaluation 03/12/21    Authorization Type [employer]    Authorization Time Period 7/25-9/17/22    Authorization - Visit Number 13    Authorization - Number of Visits 16    PT Start Time 1100    PT Stop Time [id number]    PT Time Calculation (min) 44 min    Activity Tolerance Patient tolerated treatment well    Behavior During Therapy WFL for tasks assessed/performed             Past Medical History:  Diagnosis Date   Depression    GERD (gastroesophageal reflux disease)    Hypertension     Past Surgical History:  Procedure Laterality Date   ABDOMINAL HYSTERECTOMY  1985   CHOLECYSTECTOMY     SHOULDER ARTHROSCOPY WITH ROTATOR CUFF REPAIR AND SUBACROMIAL DECOMPRESSION Right 08/12/2020   Procedure: RIGHT SHOULDER ARTHROSCOPY WITH ROTATOR CUFF REPAIR AND SUBACROMIAL DECOMPRESSION;  Surgeon: Denise Rossetti, MD;  Location: [facility];  Service: Orthopedics;  Laterality: Right;   TOTAL KNEE ARTHROPLASTY Right 12/24/2020   Procedure: RIGHT TOTAL KNEE ARTHROPLASTY;  Surgeon: Denise Rossetti, MD;  Location: [facility];  Service: Orthopedics;  Laterality: Right;    There were no vitals filed for this visit.   Subjective Assessment - 03/03/21 1101     Subjective Patient reports the left knee is feeling a little bit better compared to last time, but she hasn't done anything yet today to make it aggravated. No issues with the right. She has f/u with surgeon this afternoon.    Patient Stated Goals To get away from the cane and be  normal again, work in my yard    Currently in Pain? Yes    Pain Score 3     Pain Location Knee    Pain Orientation Left;Anterior    Pain Descriptors / Indicators Aching    Pain Onset More than a month ago    Pain Frequency Constant    Aggravating Factors  walking, standing    Pain Relieving Factors rest                               [facility] Adult PT Treatment/Exercise - 03/03/21 0001       Neuro Re-ed    Neuro Re-ed Details  SL cone tap (tall cone placed on stool) 2 x 10 each, tandem tennis ball toss 2 x 10 each; backward tandem walk 2 x 15 ft      Knee/Hip Exercises: Stretches   Sports administrator 60 seconds    Quad Stretch Limitations bilateral with strap      Knee/Hip Exercises: Aerobic   Nustep L5 x 5 min UE/LE      Knee/Hip Exercises: Standing   Other Standing Knee Exercises forward hurdle steps 6 hurdles step to down and back 1 trials, reciprocal stepping 3 trials down and back, lateral hurdle step overs 3 sets down and back  PT Short Term Goals - 02/02/21 1101       PT SHORT TERM GOAL #1   Title Patient will be independent with initial HEP.    Time 2    Period Weeks    Status Achieved    Target Date 01/25/21      PT SHORT TERM GOAL #2   Title Patient will demonstrate at least 100 degrees of Rt knee flexion AROM to improve ability to complete sit to stand.    Baseline 103 8/2    Time 4    Period Weeks    Status Achieved    Target Date 02/08/21      PT SHORT TERM GOAL #3   Title Patient will demonstrate full Rt knee extension to improve gait pattern.    Baseline 0 8/2    Time 4    Period Weeks    Status Achieved    Target Date 02/08/21      PT SHORT TERM GOAL #4   Title Patient will be able to ascend/descend stairs with step to pattern and use of handrails in order to return to her home.    Baseline no issues with stairs, has returned home.    Time 4    Period Weeks    Status Achieved    Target Date  02/08/21               PT Long Term Goals - 02/21/21 1109       PT LONG TERM GOAL #1   Title Patient will demonstrate at least 4+/5 Rt knee strength to improve stability necessary for prolonged walking activity.    Baseline n/a due to post-op acuity    Time 8    Period Weeks    Status Deferred      PT LONG TERM GOAL #2   Title Patient will demonstrate at least 120 degrees of Rt knee flexion AROM in order to improve ability to complete squatting/bending activity necessary for gardening.    Baseline 85    Time 8    Period Weeks    Status Achieved      PT LONG TERM GOAL #3   Title Patient will demonstrate normalized gait mechanics with LRAD.    Baseline excessive lateral trunk flexion    Time 8    Period Weeks    Status On-going      PT LONG TERM GOAL #4   Title Patient will complete 5 x STS in less than 12 seconds to signify improvements in overall functional strength.    Baseline 19.7    Time 8    Period Weeks    Status Deferred      PT LONG TERM GOAL #5   Title Patient will complete TUG in less than 15 seconds to decrease her risk of falls.    Baseline 9.6    Time 8    Period Weeks    Status Achieved      PT LONG TERM GOAL #6   Title Patient will maintain Rt SLS for at least 10 seconds to improve her overall stability when walking on uneven terrain.    Baseline 2 seconds; 26 seconds 8/17    Time 8    Period Weeks    Status Achieved                   Plan - 03/03/21 1146     Clinical Impression Statement Denise Morales is making excellent functional progress in regards to her  Rt knee s/p TKA, though continues to report moderate Lt knee pain throughout session. With hurdle step overs utilizing reciprocal pattern during first trial she had occasional LOB requiring therapist assistance to regain balance. She became frustrated when performing this exercise stating that she felt she was regressing in her progress due to her left knee pain. With encouragement she  was able to complete 2 additional sets without LOB. Able to progress dynamic balance activity with patient demonstrating good postural control and no LOB, though occasional catching in the left knee reported. She has plans to discuss potential interventions with Dr. Ninfa Morales today at her appointment in regards to her ongoing Lt knee pain. She is nearing discharge in PT pending overall tolerance/independence to advanced home program that will be issued at next visit. Left knee pain rated as 5/10 at end of session, no right knee pain.    PT Treatment/Interventions ADLs/Self Care Home Management;Cryotherapy;Moist Heat;Gait training;Stair training;Therapeutic activities;Therapeutic exercise;Balance training;Neuromuscular re-education;Patient/family education;Manual techniques;Passive range of motion;Dry needling;Taping    PT Next Visit Plan anticipate D/C, update HEP.    PT Home Exercise Plan Access Code [id number] and Agree with Plan of Care Patient             Patient will benefit from skilled therapeutic intervention in order to improve the following deficits and impairments:  Abnormal gait, Decreased range of motion, Difficulty walking, Decreased activity tolerance, Pain, Decreased balance, Decreased strength  Visit Diagnosis: Acute pain of right knee  Stiffness of right knee, not elsewhere classified  Muscle weakness (generalized)  Difficulty in walking, not elsewhere classified     Problem List Patient Active Problem List   Diagnosis Date Noted   Status post right knee replacement 12/24/2020   Unilateral primary osteoarthritis, left knee 11/11/2020   Unilateral primary osteoarthritis, right knee 11/11/2020   Complete tear of right rotator cuff 08/12/2020   Hypertensive disorder 05/05/2019   Gwendolyn Grant, PT, DPT, ATC 03/03/21 11:53 AM   [facility] [address] [place], [place], [address] Phone: [phone]    Fax:  [phone]  Name: Denise Morales MRN: [id number] Date of Birth: 09-20-1960

## 2021-03-07 ENCOUNTER — Ambulatory Visit: Payer: Medicaid Other

## 2021-03-07 ENCOUNTER — Other Ambulatory Visit: Payer: Self-pay

## 2021-03-07 DIAGNOSIS — M6281 Muscle weakness (generalized): Secondary | ICD-10-CM

## 2021-03-07 DIAGNOSIS — M25661 Stiffness of right knee, not elsewhere classified: Secondary | ICD-10-CM

## 2021-03-07 DIAGNOSIS — R262 Difficulty in walking, not elsewhere classified: Secondary | ICD-10-CM

## 2021-03-07 DIAGNOSIS — M25561 Pain in right knee: Secondary | ICD-10-CM | POA: Diagnosis not present

## 2021-03-07 NOTE — Therapy (Signed)
[place], [place], [address] Phone: [phone]   Fax:  [phone]  Physical Therapy Treatment/Discharge  Patient Details  Name: Miroslava Santellan MRN: [id number] Date of Birth: March 23, 1961 Referring Provider (PT): Mcarthur Rossetti, MD   Encounter Date: 03/07/2021   PT End of Session - 03/07/21 1101     Visit Number 15    Number of Visits 17    Date for PT Re-Evaluation 03/12/21    Authorization Type [employer]    Authorization Time Period 7/25-9/17/22    Authorization - Visit Number 14    Authorization - Number of Visits 16    PT Start Time 1101    PT Stop Time 7408    PT Time Calculation (min) 25 min    Activity Tolerance Patient tolerated treatment well    Behavior During Therapy WFL for tasks assessed/performed             Past Medical History:  Diagnosis Date   Depression    GERD (gastroesophageal reflux disease)    Hypertension     Past Surgical History:  Procedure Laterality Date   ABDOMINAL HYSTERECTOMY  1985   CHOLECYSTECTOMY     SHOULDER ARTHROSCOPY WITH ROTATOR CUFF REPAIR AND SUBACROMIAL DECOMPRESSION Right 08/12/2020   Procedure: RIGHT SHOULDER ARTHROSCOPY WITH ROTATOR CUFF REPAIR AND SUBACROMIAL DECOMPRESSION;  Surgeon: Mcarthur Rossetti, MD;  Location: [facility];  Service: Orthopedics;  Laterality: Right;   TOTAL KNEE ARTHROPLASTY Right 12/24/2020   Procedure: RIGHT TOTAL KNEE ARTHROPLASTY;  Surgeon: Mcarthur Rossetti, MD;  Location: [facility];  Service: Orthopedics;  Laterality: Right;    There were no vitals filed for this visit.   Subjective Assessment - 03/07/21 1102     Subjective She had f/u with surgeon who is pleased with her progress of the Rt knee and is ok to finish up with PT at this time. As far as the Lt knee goes she is planning to try a gel injection and if no relief from this will discuss potential surgical options. She  feels that she is ready for discharge and can continue with her HEP independently.    Patient Stated Goals To get away from the cane and be normal again, work in my yard    Currently in Pain? Yes    Pain Score 4     Pain Location Knee    Pain Orientation Left;Anterior    Pain Descriptors / Indicators Aching    Pain Type Chronic pain    Pain Onset More than a month ago    Pain Frequency Constant    Aggravating Factors  walking, standing    Pain Relieving Factors rest                [facility] PT Assessment - 03/07/21 0001       AROM   Right Knee Flexion 124      Strength   Right Hip Flexion 4+/5    Left Hip Flexion 4/5    Right Knee Flexion 5/5    Right Knee Extension 5/5    Left Knee Flexion 5/5    Left Knee Extension 5/5      Transfers   Five time sit to stand comments  11.5      Ambulation/Gait   Ambulation/Gait Yes    Gait Comments gait mechanics WNL  PT Education - 03/07/21 1130     Education Details Education on re-assessment findings, progress towards goals, reviewed HEP discussing ways to progress her balance exercises, recommended to f/u with Dr. [name] regarding injection by the end of the week if she has not heard from his office.    Person(s) Educated Patient    Methods Explanation;Handout    Comprehension Verbalized understanding;Returned demonstration              PT Short Term Goals - 02/02/21 1101       PT SHORT TERM GOAL #1   Title Patient will be independent with initial HEP.    Time 2    Period Weeks    Status Achieved    Target Date 01/25/21      PT SHORT TERM GOAL #2   Title Patient will demonstrate at least 100 degrees of Rt knee flexion AROM to improve ability to complete sit to stand.    Baseline 103 8/2    Time 4    Period Weeks    Status Achieved    Target Date 02/08/21      PT SHORT TERM GOAL #3   Title Patient will demonstrate full Rt knee extension to improve  gait pattern.    Baseline 0 8/2    Time 4    Period Weeks    Status Achieved    Target Date 02/08/21      PT SHORT TERM GOAL #4   Title Patient will be able to ascend/descend stairs with step to pattern and use of handrails in order to return to her home.    Baseline no issues with stairs, has returned home.    Time 4    Period Weeks    Status Achieved    Target Date 02/08/21               PT Long Term Goals - 03/07/21 1114       PT LONG TERM GOAL #1   Title Patient will demonstrate at least 4+/5 Rt knee strength to improve stability necessary for prolonged walking activity.    Baseline n/a due to post-op acuity    Time 8    Period Weeks    Status Achieved      PT LONG TERM GOAL #2   Title Patient will demonstrate at least 120 degrees of Rt knee flexion AROM in order to improve ability to complete squatting/bending activity necessary for gardening.    Baseline 85    Time 8    Period Weeks    Status Achieved      PT LONG TERM GOAL #3   Title Patient will demonstrate normalized gait mechanics with LRAD.    Baseline excessive lateral trunk flexion    Time 8    Period Weeks    Status Achieved      PT LONG TERM GOAL #4   Title Patient will complete 5 x STS in less than 12 seconds to signify improvements in overall functional strength.    Baseline 19.7    Time 8    Period Weeks    Status Achieved      PT LONG TERM GOAL #5   Title Patient will complete TUG in less than 15 seconds to decrease her risk of falls.    Baseline 9.6    Time 8    Period Weeks    Status Achieved      PT LONG TERM GOAL #6   Title Patient will maintain  Rt SLS for at least 10 seconds to improve her overall stability when walking on uneven terrain.    Baseline 2 seconds; 26 seconds 8/17    Time 8    Period Weeks    Status Achieved                   Plan - 03/07/21 1120     Clinical Impression Statement Sury has made excellent functional progress s/p Rt TKA having met all  established functional goals. She demonstrates full ROM and strength about the Rt knee and scores at a low fall risk based upon her TUG and 5xSTS test. She continues to report moderate Lt knee pain with plans to undergo another injection to assist in overall pain reduction. She demonstrates independence in advanced home program and is appropriate for discharge at this time.    PT Treatment/Interventions ADLs/Self Care Home Management;Cryotherapy;Moist Heat;Gait training;Stair training;Therapeutic activities;Therapeutic exercise;Balance training;Neuromuscular re-education;Patient/family education;Manual techniques;Passive range of motion;Dry needling;Taping    PT Next Visit Plan --    PT Home Exercise Plan Access Code [email or web address]    Consulted and Agree with Plan of Care Patient             Patient will benefit from skilled therapeutic intervention in order to improve the following deficits and impairments:  Abnormal gait, Decreased range of motion, Difficulty walking, Decreased activity tolerance, Pain, Decreased balance, Decreased strength  Visit Diagnosis: Acute pain of right knee  Stiffness of right knee, not elsewhere classified  Difficulty in walking, not elsewhere classified  Muscle weakness (generalized)     Problem List Patient Active Problem List   Diagnosis Date Noted   Status post right knee replacement 12/24/2020   Unilateral primary osteoarthritis, left knee 11/11/2020   Unilateral primary osteoarthritis, right knee 11/11/2020   Complete tear of right rotator cuff 08/12/2020   Hypertensive disorder 05/05/2019  PHYSICAL THERAPY DISCHARGE SUMMARY  Visits from Start of Care: 15  Current functional level related to goals / functional outcomes: See goals above   Remaining deficits: Lt knee pain   Education / Equipment: See above   Patient agrees to discharge. Patient goals were met. Patient is being discharged due to meeting the stated rehab goals.  Gwendolyn Grant,  PT, DPT, ATC 03/07/21 11:42 AM   [facility] [address] [place], [place], [address] Phone: [phone]   Fax:  [phone]  Name: Roland Prine MRN: [id number] Date of Birth: 1961/06/16

## 2021-03-09 ENCOUNTER — Ambulatory Visit: Payer: Medicaid Other

## 2021-03-09 ENCOUNTER — Telehealth: Payer: Self-pay

## 2021-03-09 ENCOUNTER — Other Ambulatory Visit: Payer: Self-pay | Admitting: Orthopaedic Surgery

## 2021-03-09 MED ORDER — ROPINIROLE HCL 0.25 MG PO TABS: 0.2500 mg | ORAL_TABLET | Freq: Every day | ORAL | 6 refills | Status: DC

## 2021-03-09 NOTE — Telephone Encounter (Signed)
Received pharmacy request for a refill on Ropinirole .'25mg'$ . to the [facility]. Last refilled on 02/21/21 for # 30.   Please advise

## 2021-03-11 ENCOUNTER — Telehealth: Payer: Self-pay | Admitting: Orthopaedic Surgery

## 2021-03-11 NOTE — Telephone Encounter (Signed)
Pt called requesting a call back from April about update for submission of gel injection. Please call pt at [phone].

## 2021-03-14 ENCOUNTER — Telehealth: Payer: Self-pay

## 2021-03-14 NOTE — Telephone Encounter (Signed)
Talked with patient concerning Monovisc injection for left knee through [employer]. Faxed PRF form to [employer] at [phone] for next gel injection after 03/25/2021.

## 2021-03-14 NOTE — Telephone Encounter (Signed)
Talked with patient concerning gel injection.  

## 2021-03-16 DIAGNOSIS — E785 Hyperlipidemia, unspecified: Secondary | ICD-10-CM | POA: Diagnosis not present

## 2021-03-16 DIAGNOSIS — I10 Essential (primary) hypertension: Secondary | ICD-10-CM | POA: Diagnosis not present

## 2021-03-16 DIAGNOSIS — G2581 Restless legs syndrome: Secondary | ICD-10-CM | POA: Insufficient documentation

## 2021-03-16 DIAGNOSIS — Z1211 Encounter for screening for malignant neoplasm of colon: Secondary | ICD-10-CM | POA: Diagnosis not present

## 2021-03-16 DIAGNOSIS — F419 Anxiety disorder, unspecified: Secondary | ICD-10-CM | POA: Insufficient documentation

## 2021-03-23 DIAGNOSIS — M130 Polyarthritis, unspecified: Secondary | ICD-10-CM | POA: Diagnosis not present

## 2021-03-23 DIAGNOSIS — E039 Hypothyroidism, unspecified: Secondary | ICD-10-CM | POA: Diagnosis not present

## 2021-03-25 ENCOUNTER — Encounter: Payer: Self-pay | Admitting: Gastroenterology

## 2021-03-29 ENCOUNTER — Telehealth: Payer: Self-pay

## 2021-03-29 NOTE — Telephone Encounter (Signed)
Called and left a Vm advising patient to Cb to schedule for gel injection with Dr. [name] or Rosanna Randy.  Received Monovisc from [employer] patient assistance for left knee.

## 2021-03-30 ENCOUNTER — Other Ambulatory Visit: Payer: Self-pay | Admitting: Orthopaedic Surgery

## 2021-03-30 ENCOUNTER — Telehealth: Payer: Self-pay | Admitting: Orthopaedic Surgery

## 2021-03-30 MED ORDER — NABUMETONE 500 MG PO TABS: 500.0000 mg | ORAL_TABLET | Freq: Two times a day (BID) | ORAL | 1 refills | Status: DC | PRN

## 2021-03-30 NOTE — Telephone Encounter (Signed)
Patient called asked if Dr [name] will fill Rx Nabumetone 500 mg tabs. Patient is a former patient os Dr Junius Roads. Patient also asked if the Rx can be written for a 90 day supply? The number to contact patient is [phone]

## 2021-04-06 ENCOUNTER — Ambulatory Visit: Payer: Medicaid Other | Admitting: Orthopaedic Surgery

## 2021-04-06 DIAGNOSIS — M1712 Unilateral primary osteoarthritis, left knee: Secondary | ICD-10-CM

## 2021-04-06 NOTE — Progress Notes (Signed)
   Procedure Note  Patient: Denise Morales             Date of Birth: 1960-07-21           MRN: [id number]             Visit Date: 04/06/2021  Procedures: Visit Diagnoses:  1. Unilateral primary osteoarthritis, left knee     The patient comes in today to have Monovisc that was donated by [employer] placed in her left arthritic knee.  We replaced her right knee in July and she is trying to avoid any type of surgery on her left knee.  Fortunately the company was able to donate this hyaluronic acid injection.  I did place it in her left knee today without difficulty.  She is quite tearful due to the pain she is experiencing with her left knee.  We will see her back in 6 weeks to see how she is doing overall.  At that visit I would like to obtain an AP and lateral of her right operative knee.

## 2021-04-10 ENCOUNTER — Other Ambulatory Visit: Payer: Self-pay | Admitting: Family Medicine

## 2021-04-10 DIAGNOSIS — F32A Depression, unspecified: Secondary | ICD-10-CM

## 2021-04-10 NOTE — Telephone Encounter (Signed)
Requested Prescriptions  Pending Prescriptions Disp Refills  . hydrOXYzine (ATARAX/VISTARIL) 25 MG tablet [Pharmacy Med Name: hydrOXYzine HCl 25 MG Oral Tablet] 30 tablet 1    Sig: TAKE 1 TABLET BY MOUTH AT BEDTIME AS NEEDED     Ear, Nose, and Throat:  Antihistamines Passed - 04/10/2021  2:38 PM      Passed - Valid encounter within last 12 months    Recent Outpatient Visits          1 month ago Anxiety and depression   [place], [place], MD   2 months ago Gastroesophageal reflux disease without esophagitis   [facility] Kerin Perna, NP   4 months ago Encounter for screening mammogram for malignant neoplasm of breast   [place] Juluis Mire P, NP   7 months ago Adjustment disorder with mixed anxiety and depressed mood   [place] Juluis Mire P, NP   8 months ago Adjustment disorder with mixed anxiety and depressed mood   [place], [place], NP      Future Appointments            In 1 month [name], Lind Guest, MD [place]

## 2021-04-20 ENCOUNTER — Other Ambulatory Visit (INDEPENDENT_AMBULATORY_CARE_PROVIDER_SITE_OTHER): Payer: Self-pay | Admitting: Primary Care

## 2021-04-20 DIAGNOSIS — K219 Gastro-esophageal reflux disease without esophagitis: Secondary | ICD-10-CM

## 2021-04-20 NOTE — Telephone Encounter (Signed)
Sent to PCP ?

## 2021-05-17 ENCOUNTER — Other Ambulatory Visit: Payer: Self-pay

## 2021-05-17 ENCOUNTER — Ambulatory Visit (AMBULATORY_SURGERY_CENTER): Payer: Medicaid Other | Admitting: *Deleted

## 2021-05-17 VITALS — Ht 59.0 in | Wt 170.0 lb

## 2021-05-17 DIAGNOSIS — Z1211 Encounter for screening for malignant neoplasm of colon: Secondary | ICD-10-CM

## 2021-05-17 MED ORDER — NA SULFATE-K SULFATE-MG SULF 17.5-3.13-1.6 GM/177ML PO SOLN: 1.0000 | Freq: Once | ORAL | 0 refills | Status: AC

## 2021-05-17 NOTE — Progress Notes (Signed)
Virtual pre visit completed over telephone.  Instructions through MyChart and mail.    No egg or soy allergy known to patient  No issues known to pt with past sedation with any surgeries or procedures Patient denies ever being told they had issues or difficulty with intubation  No FH of Malignant Hyperthermia Pt is not on diet pills Pt is not on  home 02  Pt is not on blood thinners  Pt denies issues with constipation  No A fib or A flutter   Due to the COVID-19 pandemic we are asking patients to follow certain guidelines in [place] and the [place]   Pt aware of COVID protocols and [facility] guidelines

## 2021-05-18 ENCOUNTER — Ambulatory Visit: Payer: Self-pay

## 2021-05-18 ENCOUNTER — Ambulatory Visit (INDEPENDENT_AMBULATORY_CARE_PROVIDER_SITE_OTHER): Payer: Medicaid Other

## 2021-05-18 ENCOUNTER — Ambulatory Visit (INDEPENDENT_AMBULATORY_CARE_PROVIDER_SITE_OTHER): Payer: Medicaid Other | Admitting: Orthopaedic Surgery

## 2021-05-18 ENCOUNTER — Encounter: Payer: Self-pay | Admitting: Orthopaedic Surgery

## 2021-05-18 DIAGNOSIS — Z96651 Presence of right artificial knee joint: Secondary | ICD-10-CM | POA: Diagnosis not present

## 2021-05-18 DIAGNOSIS — M25562 Pain in left knee: Secondary | ICD-10-CM | POA: Diagnosis not present

## 2021-05-18 DIAGNOSIS — G8929 Other chronic pain: Secondary | ICD-10-CM

## 2021-05-18 DIAGNOSIS — M25561 Pain in right knee: Secondary | ICD-10-CM | POA: Diagnosis not present

## 2021-05-18 DIAGNOSIS — M1712 Unilateral primary osteoarthritis, left knee: Secondary | ICD-10-CM

## 2021-05-18 MED ORDER — TRAMADOL HCL 50 MG PO TABS: 100.0000 mg | ORAL_TABLET | Freq: Four times a day (QID) | ORAL | 0 refills | Status: DC | PRN

## 2021-05-18 NOTE — Progress Notes (Signed)
The patient is a very pleasant 60 year old female who is now 4 months status post a right total knee arthroplasty.  She says the right knee is doing wonderful and she has good range of motion of that knee.  She has well-documented severe arthritis in her left knee.  She has tried and failed conservative treatment left knee including steroid injections and now hyaluronic acid injections.  Those have not helped her 1 bit.  She is requesting a refill of tramadol.  She has tried quad training exercises for her left knee.  She has taken anti-inflammatories and does ambulate with a cane in her opposite hand.  At this point her left knee pain is definitely affecting her mobility, her quality of life and actives of living.  She does wish to pursue a left knee replacement given the success of her right knee replacement.  She currently denies a headache, chest pain, shortness of breath, fever, chills, nausea, vomiting.  She has had no acute change in her medical status either.  Examination of her right operative knee shows well-healed surgical incision.  She has full flexion and full extension of the right knee with no swelling.  It is ligamentously stable.  Examination of the left knee shows varus malalignment with severe medial compartment pain and patellofemoral pain.  There is patellofemoral crepitation with flexion extension.  An AP and lateral both knees shows a well-seated total knee arthroplasty on the right side.  The left side has severe tricompartment arthritis with periarticular osteophytes, significant medial joint space narrowing and patellofemoral narrowing as well as varus malalignment.  At this point we agree with pursuing a left knee replacement for her given the failure of conservative treatment combined with her clinical exam findings and x-ray findings showing severe arthritis of the left knee.  She is fully aware of the intraoperative and postoperative course and the risk and benefits of the  surgery.  I will send in some tramadol for pain.  We will work on getting this scheduled.

## 2021-05-24 NOTE — Progress Notes (Signed)
Office Visit Note  Patient: Denise Morales             Date of Birth: 01/18/1961           MRN: [id number]             PCP: Everardo Beals, NP Referring: Everardo Beals, NP Visit Date: 05/25/2021 Occupation: Former insurance work, Librarian, academic work  Subjective:    History of Present Illness: Denise Morales is a 60 y.o. female here for joint and muscle pain in multiple sites and elevated uric acid and patient concern for fibromyalgia syndrome due to generalized body pains. She had previous right knee arthroplasty and management for left knee arthritis in orthopedic clinic including hyaluronic acid injections.  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  Symptoms vary in severity with good days and bad days particularly feels worse after heavy exertion for a few days at a time.  Sometimes has localized joint pains especially in the shoulders and knees but also gets generalized pain.  She does not typically notice any swelling erythema or warmth around specific affected areas. Sometimes sensitive to benign pressure such as hugs or shower water.  She feels persistently fatigued and very restless at nighttime.  She is having attentional difficulty losing track of conversations and way but no specific memory problems.  She has chronic irritable bowel diarrhea predominant symptoms.  She does not have much issue with headaches.   Labs reviewed ANA neg RF neg ESR 6 CRP 14 Uric acid 8.5 CMP eGFR 58  Activities of Daily Living:  Patient reports morning stiffness for all day. Patient Reports nocturnal pain.  Difficulty dressing/grooming: Denies Difficulty climbing stairs: Reports Difficulty getting out of chair: Reports Difficulty using hands for taps, buttons, cutlery, and/or writing: Reports  Review of Systems   Constitutional:  Positive for fatigue.  HENT:  Positive for mouth dryness. Negative for mouth sores and nose dryness.   Eyes:  Positive for dryness. Negative for pain and itching.  Respiratory:  Negative for shortness of breath and difficulty breathing.   Cardiovascular:  Negative for chest pain and palpitations.  Gastrointestinal:  Negative for blood in stool, constipation and diarrhea.  Endocrine: Negative for increased urination.  Genitourinary:  Negative for difficulty urinating.  Musculoskeletal:  Positive for joint pain, joint pain, myalgias, morning stiffness, muscle tenderness and myalgias. Negative for joint swelling.  Skin:  Negative for color change, rash and redness.  Allergic/Immunologic: Negative for susceptible to infections.  Neurological:  Positive for weakness. Negative for dizziness, numbness, headaches and memory loss.  Hematological:  Negative for bruising/bleeding tendency.  Psychiatric/Behavioral:  Negative for confusion.    PMFS History:  Patient Active Problem List   Diagnosis Date Noted   Neoplasm of uncertain behavior of skin 05/25/2021   Generalized osteoarthritis of multiple sites 05/25/2021   Fibromyalgia syndrome 05/25/2021   Restless legs 03/16/2021   Hyperlipidemia 03/16/2021   Anxiety 03/16/2021   Status post right knee replacement 12/24/2020   Unilateral primary osteoarthritis, left knee 11/11/2020   Unilateral primary osteoarthritis, right knee 11/11/2020   Complete tear of right rotator cuff 08/12/2020   Hypertensive disorder 05/05/2019    Past Medical History:  Diagnosis Date   Arthritis    Depression    GERD (gastroesophageal reflux disease)    Hyperlipidemia    Hypertension  Melanoma ([place])    face   Thyroid disease     Family History  Problem Relation Age of Onset   Dementia Mother    Heart Problems Mother    Stroke Mother    Hypertension Mother    Hypertension Father    Colon polyps Father    Diabetes Father     Hypertension Sister    Diabetes Sister        borderline   Fibromyalgia Sister    Hypertension Sister    Colon polyps Sister    Diabetes Sister    Fibromyalgia Sister    Heart Problems Sister    Arthritis Sister    Hypertension Sister    Heart Problems Sister    Diabetes Paternal Aunt    Healthy Daughter    Colon cancer Neg Hx    Esophageal cancer Neg Hx    Rectal cancer Neg Hx    Stomach cancer Neg Hx    Past Surgical History:  Procedure Laterality Date   ABDOMINAL HYSTERECTOMY  1985   CHOLECYSTECTOMY     COLONOSCOPY     SHOULDER ARTHROSCOPY WITH ROTATOR CUFF REPAIR AND SUBACROMIAL DECOMPRESSION Right 08/12/2020   Procedure: RIGHT SHOULDER ARTHROSCOPY WITH ROTATOR CUFF REPAIR AND SUBACROMIAL DECOMPRESSION;  Surgeon: Mcarthur Rossetti, MD;  Location: [facility];  Service: Orthopedics;  Laterality: Right;   TOTAL KNEE ARTHROPLASTY Right 12/24/2020   Procedure: RIGHT TOTAL KNEE ARTHROPLASTY;  Surgeon: Mcarthur Rossetti, MD;  Location: [facility];  Service: Orthopedics;  Laterality: Right;   Social History   Social History Narrative   Not on file   Immunization History  Administered Date(s) Administered   Engineer, 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  HENT:     Right Ear: External ear normal.     Left Ear: External ear normal.  Eyes:     Conjunctiva/sclera: Conjunctivae normal.  Cardiovascular:     Rate and Rhythm: Normal rate and regular rhythm.  Pulmonary:     Effort: Pulmonary effort is normal.     Breath sounds: Normal breath sounds.  Musculoskeletal:     Right lower leg: No edema.     Left lower leg: No edema.  Skin:    General: Skin is warm and dry.     Findings: No  rash.  Neurological:     General: No focal deficit present.     Mental Status: She is alert.  Psychiatric:        Mood and Affect: Mood normal.     Musculoskeletal Exam:  Left shoulder pain with overhead abduction and with external rotation some radiation into upper arm Elbows full ROM no tenderness or swelling Wrists full ROM no tenderness or swelling Fingers full ROM, tenderness to pressure on left 5th DIP no swelling present Diffuse paraspinal tenderness to palpation throughout upper and lower back Left knee decreased ROM tenderness to pressure no palpable effusion no instability, right knee normal ROM Ankles full ROM no tenderness or swelling Pain to pressure over left dorsal midfoot no appreciable swelling, significant dorsal osteophytes noted on limited ultrasound inspection  Diffuse tender points throughout back and in all extremities distal and  proximal   Investigation: No additional findings.  Imaging: No results found.  Recent Labs: Lab Results  Component Value Date   WBC 11.9 (H) 12/25/2020   HGB 10.3 (L) 12/25/2020   PLT 320 12/25/2020   NA 140 12/25/2020   K 3.8 12/25/2020   CL 106 12/25/2020   CO2 25 12/25/2020   GLUCOSE 130 (H) 12/25/2020   BUN 25 (H) 12/25/2020   CREATININE 1.17 (H) 12/25/2020   BILITOT 0.3 01/01/2020   ALKPHOS 99 01/01/2020   AST 16 01/01/2020   ALT 13 01/01/2020   PROT 6.9 01/01/2020   ALBUMIN 4.6 01/01/2020   CALCIUM 8.3 (L) 12/25/2020   GFRAA 60 01/01/2020    Speciality Comments: No specialty comments available.  Procedures:  No procedures performed Allergies: Patient has no known allergies.   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 Chuck Hint has at least a moderate degree of osteoarthritis in multiple sites most especially the foot and the left knee.  There is not much tenderness to pressure but is having pain with use and  slightly decreased range of motion.  History and exam nothing specific the suggestive for gout or other inflammatory process. At this time I recommend conservative or symptomatic treatments including anti-inflammatory medication oral supplements topical medicines or compressive gloves or brace to help support the joint.  She would be a candidate for physical therapy or steroid joint injection as needed for symptom exacerbation.  Fibromyalgia syndrome  Symptoms are consistent with fibromyalgia syndrome with diffuse body pains and allodynia, tender points on examination, and some frequently associated generalized symptoms with attentional difficulty, fatigue, irritable bowels.  Discussed medication could be options such as SNRI drugs she is currently on Lexapro which is also reasonable.  Reviewed the typically chronic but benign course of symptoms recommended her to review the [place] or other web resources for patients.   Orders: No orders of the defined types were placed in this encounter.  No orders of the defined types were placed in this encounter.    Follow-Up Instructions: No follow-ups on file.   Collier Salina, MD  Note - This record has been created using [employer].  Chart creation errors have been sought, but may not always  have been located. Such creation errors do not reflect on  the standard of medical care.

## 2021-05-25 ENCOUNTER — Encounter: Payer: Self-pay | Admitting: Internal Medicine

## 2021-05-25 ENCOUNTER — Ambulatory Visit: Payer: Medicaid Other | Admitting: Internal Medicine

## 2021-05-25 ENCOUNTER — Other Ambulatory Visit: Payer: Self-pay

## 2021-05-25 VITALS — BP 122/87 | HR 85 | Ht 59.0 in | Wt 176.4 lb

## 2021-05-25 DIAGNOSIS — D485 Neoplasm of uncertain behavior of skin: Secondary | ICD-10-CM | POA: Insufficient documentation

## 2021-05-25 DIAGNOSIS — M797 Fibromyalgia: Secondary | ICD-10-CM

## 2021-05-25 DIAGNOSIS — M1712 Unilateral primary osteoarthritis, left knee: Secondary | ICD-10-CM

## 2021-05-25 DIAGNOSIS — M159 Polyosteoarthritis, unspecified: Secondary | ICD-10-CM

## 2021-05-25 NOTE — Patient Instructions (Signed)
I recommend checking out the [place] patient-centered guide for fibromyalgia and chronic pain management: [email or web address]  For osteoarthritis several treatments may be beneficial: - Topical antiinflammatory medicine such as diclofenac or Voltaren can be applied to  affected area as needed but may be less effective than oral antiinflammatory medicine. Topical analgesics containing CBD, menthol, or lidocaine can be tried.  - Oral nonsteroidal antiinflammatory medication such as ibuprofen, aleve, celebrex, or mobic are very helpful for osteoarthritis but can cause side effects such as stomach ulcers or hypertension with prolonged use. These should be taken intermittently or as needed, and always taken with food.  - Other oral supplements such as glucosamine chondroitin or chondroitin sulfate OTC treatments do not have strong data supporting effectiveness but can be helpful for some individuals and have no major side effects. Turmeric has some antiinflammatory effect similar to NSAID medications and may help, if taken as a supplement should not be taken above recommended doses.  - Compressive gloves can be helpful to support the thumb joint especially if hurting with certain activities.

## 2021-05-30 ENCOUNTER — Encounter: Payer: Self-pay | Admitting: Gastroenterology

## 2021-05-30 ENCOUNTER — Other Ambulatory Visit: Payer: Self-pay

## 2021-05-30 ENCOUNTER — Ambulatory Visit (AMBULATORY_SURGERY_CENTER): Payer: Medicaid Other | Admitting: Gastroenterology

## 2021-05-30 VITALS — BP 120/63 | HR 68 | Temp 98.6°F | Resp 20 | Ht 59.0 in | Wt 170.0 lb

## 2021-05-30 DIAGNOSIS — D3615 Benign neoplasm of peripheral nerves and autonomic nervous system of abdomen: Secondary | ICD-10-CM | POA: Diagnosis not present

## 2021-05-30 DIAGNOSIS — D120 Benign neoplasm of cecum: Secondary | ICD-10-CM

## 2021-05-30 DIAGNOSIS — Z1211 Encounter for screening for malignant neoplasm of colon: Secondary | ICD-10-CM | POA: Diagnosis not present

## 2021-05-30 DIAGNOSIS — D125 Benign neoplasm of sigmoid colon: Secondary | ICD-10-CM | POA: Diagnosis not present

## 2021-05-30 MED ORDER — SODIUM CHLORIDE 0.9 % IV SOLN: 500.0000 mL | Freq: Once | INTRAVENOUS | Status: DC

## 2021-05-30 NOTE — Op Note (Signed)
[facility] Patient Name: Denise Morales Procedure Date: 05/30/2021 11:39 AM MRN: [id number] Endoscopist: Nicki Reaper E. Candis Schatz , MD Age: 60 Referring MD:  Date of Birth: 02/17/1961 Gender: Female Account #: [id number] Procedure:                Colonoscopy Indications:              Screening for colorectal malignant neoplasm, This                            is the patient's first colonoscopy Medicines:                Monitored Anesthesia Care Procedure:                Pre-Anesthesia Assessment:                           - Prior to the procedure, a History and Physical                            was performed, and patient medications and                            allergies were reviewed. The patient's tolerance of                            previous anesthesia was also reviewed. The risks                            and benefits of the procedure and the sedation                            options and risks were discussed with the patient.                            All questions were answered, and informed consent                            was obtained. Prior Anticoagulants: The patient has                            taken no previous anticoagulant or antiplatelet                            agents except for aspirin. ASA Grade Assessment: II                            - A patient with mild systemic disease. After                            reviewing the risks and benefits, the patient was                            deemed in satisfactory condition to undergo the  procedure.                           After obtaining informed consent, the colonoscope                            was passed under direct vision. Throughout the                            procedure, the patient's blood pressure, pulse, and                            oxygen saturations were monitored continuously. The                            Olympus CF-HQ190L (Serial# [id number]) Colonoscope was                             introduced through the anus and advanced to the the                            cecum, identified by appendiceal orifice and                            ileocecal valve. The colonoscopy was somewhat                            difficult due to restricted mobility of the colon.                            Successful completion of the procedure was aided by                            withdrawing the scope and replacing with the                            pediatric endoscope. The patient tolerated the                            procedure well. The quality of the bowel                            preparation was good. The ileocecal valve,                            appendiceal orifice, and rectum were photographed.                            The bowel preparation used was SUPREP via split                            dose instruction. Scope In: 11:52:10 AM Scope Out: 12:14:49 PM Scope Withdrawal Time: 0 hours 11 minutes 55 seconds  Total Procedure Duration: 0 hours 22 minutes 39 seconds  Findings:  The perianal and digital rectal examinations were                            normal. Pertinent negatives include normal                            sphincter tone and no palpable rectal lesions.                           Multiple small and large-mouthed diverticula were                            found in the sigmoid colon.                           Two sessile polyps were found in the cecum. The                            polyps were 2 to 5 mm in size. These polyps were                            removed with a cold snare. Resection and retrieval                            were complete. Estimated blood loss was minimal.                           A 10 mm polyp was found in the sigmoid colon. The                            polyp was sessile. The polyp was removed with a                            cold snare. Resection and retrieval were complete.                             Estimated blood loss was minimal.                           The exam was otherwise normal throughout the                            examined colon.                           The retroflexed view of the distal rectum and anal                            verge was normal and showed no anal or rectal                            abnormalities. Complications:            No immediate complications. Estimated Blood Loss:  Estimated blood loss was minimal. Impression:               - Diverticulosis in the sigmoid colon.                           - Two 2 to 5 mm polyps in the cecum, removed with a                            cold snare. Resected and retrieved.                           - One 10 mm polyp in the sigmoid colon, removed                            with a cold snare. Resected and retrieved.                           - The distal rectum and anal verge are normal on                            retroflexion view. Recommendation:           - Patient has a contact number available for                            emergencies. The signs and symptoms of potential                            delayed complications were discussed with the                            patient. Return to normal activities tomorrow.                            Written discharge instructions were provided to the                            patient.                           - Resume previous diet.                           - Continue present medications.                           - Await pathology results.                           - Repeat colonoscopy (date not yet determined) for                            surveillance based on pathology results. Amarria Andreasen E. Candis Schatz, MD 05/30/2021 12:20:40 PM This report has been signed electronically.

## 2021-05-30 NOTE — Progress Notes (Signed)
Called to room to assist during endoscopic procedure.  Patient ID and intended procedure confirmed with present staff. Received instructions for my participation in the procedure from the performing physician.  

## 2021-05-30 NOTE — Progress Notes (Signed)
To PACU, VSS. Report to [email or web address].tb 

## 2021-05-30 NOTE — Progress Notes (Signed)
[facility] History and Physical   Primary Care Physician:  Everardo Beals, NP   Reason for Procedure:   Colon cancer screening  Plan:    Screening colonoscopy     HPI: Denise Morales is a 60 y.o. female undergoing initial average risk screening colonoscopy.  She has no family history of colon cancer and no chronic GI symptoms.    Past Medical History:  Diagnosis Date   Arthritis    Depression    GERD (gastroesophageal reflux disease)    Hyperlipidemia    Hypertension    Melanoma ([place])    face   Thyroid disease     Past Surgical History:  Procedure Laterality Date   ABDOMINAL HYSTERECTOMY  1985   CHOLECYSTECTOMY     SHOULDER ARTHROSCOPY WITH ROTATOR CUFF REPAIR AND SUBACROMIAL DECOMPRESSION Right 08/12/2020   Procedure: RIGHT SHOULDER ARTHROSCOPY WITH ROTATOR CUFF REPAIR AND SUBACROMIAL DECOMPRESSION;  Surgeon: Mcarthur Rossetti, MD;  Location: [facility];  Service: Orthopedics;  Laterality: Right;   TOTAL KNEE ARTHROPLASTY Right 12/24/2020   Procedure: RIGHT TOTAL KNEE ARTHROPLASTY;  Surgeon: Mcarthur Rossetti, MD;  Location: [facility];  Service: Orthopedics;  Laterality: Right;    Prior to Admission medications   Medication Sig Start Date End Date Taking? Authorizing Provider  acetaminophen (TYLENOL) 500 MG tablet Take 500 mg by mouth every 6 (six) hours as needed.   Yes [provider]  allopurinol (ZYLOPRIM) 100 MG tablet Take 100 mg by mouth daily. 03/28/21  Yes [provider]  baclofen (LIORESAL) 10 MG tablet Take 1 tablet (10 mg total) by mouth 3 (three) times daily as needed for muscle spasms. 1 (ONE) TABLET BY MOUTH THREE TIMES DAILY AS NEEDED FOR MUSCLE SPASMS 12/26/20  Yes Mcarthur Rossetti, MD  diphenhydrAMINE HCl (BENADRYL PO) Take by mouth at bedtime.   Yes [provider]  escitalopram (LEXAPRO) 20 MG tablet Take 1 tablet (20 mg total) by mouth daily. 12/01/20  Yes Kerin Perna, NP   hydrOXYzine (ATARAX/VISTARIL) 25 MG tablet TAKE 1 TABLET BY MOUTH AT BEDTIME AS NEEDED 04/10/21  Yes Charlott Rakes, MD  levothyroxine (SYNTHROID) 50 MCG tablet Take 50 mcg by mouth daily. 03/23/21  Yes [provider]  lisinopril-hydrochlorothiazide (ZESTORETIC) 10-12.5 MG tablet Take 1 tablet by mouth daily. 12/01/20  Yes Kerin Perna, NP  nabumetone (RELAFEN) 500 MG tablet Take 1 tablet (500 mg total) by mouth 2 (two) times daily as needed. 03/30/21  Yes Mcarthur Rossetti, MD  omeprazole (PRILOSEC) 40 MG capsule Take 1 capsule by mouth once daily 04/21/21  Yes Kerin Perna, NP  pravastatin (PRAVACHOL) 40 MG tablet Take 1 tablet (40 mg total) by mouth daily. 12/01/20  Yes Kerin Perna, NP  rOPINIRole (REQUIP) 0.25 MG tablet Take 1 tablet (0.25 mg total) by mouth at bedtime. Take 1 tablet at night for 2 nights and then increase to 2 tablets at night for 5 days.  After that can increase to 1/3 tablet at night if needed. 03/09/21  Yes Mcarthur Rossetti, MD  traMADol (ULTRAM) 50 MG tablet Take 2 tablets (100 mg total) by mouth every 6 (six) hours as needed. 05/18/21  Yes Mcarthur Rossetti, MD  Vitamin D-Vitamin K (VITAMIN K2-VITAMIN D3 PO) Take 1 tablet by mouth daily.   Yes [provider]  aspirin 81 MG chewable tablet Chew 1 tablet (81 mg total) by mouth 2 (two) times daily. Patient not taking: Reported on 01/11/2021 12/25/20   Carlis Abbott,  Gillermo Murdoch, PA-C  oxyCODONE (OXY IR/ROXICODONE) 5 MG immediate release tablet Take 1 tablet (5 mg total) by mouth every 6 (six) hours as needed for moderate pain (pain score 4-6). Patient not taking: Reported on 05/17/2021 01/06/21   Mcarthur Rossetti, MD    Current Outpatient Medications  Medication Sig Dispense Refill   acetaminophen (TYLENOL) 500 MG tablet Take 500 mg by mouth every 6 (six) hours as needed.     allopurinol (ZYLOPRIM) 100 MG tablet Take 100 mg by mouth daily.     baclofen (LIORESAL) 10 MG  tablet Take 1 tablet (10 mg total) by mouth 3 (three) times daily as needed for muscle spasms. 1 (ONE) TABLET BY MOUTH THREE TIMES DAILY AS NEEDED FOR MUSCLE SPASMS 60 tablet 0   diphenhydrAMINE HCl (BENADRYL PO) Take by mouth at bedtime.     escitalopram (LEXAPRO) 20 MG tablet Take 1 tablet (20 mg total) by mouth daily. 90 tablet 1   hydrOXYzine (ATARAX/VISTARIL) 25 MG tablet TAKE 1 TABLET BY MOUTH AT BEDTIME AS NEEDED 30 tablet 1   levothyroxine (SYNTHROID) 50 MCG tablet Take 50 mcg by mouth daily.     lisinopril-hydrochlorothiazide (ZESTORETIC) 10-12.5 MG tablet Take 1 tablet by mouth daily. 90 tablet 1   nabumetone (RELAFEN) 500 MG tablet Take 1 tablet (500 mg total) by mouth 2 (two) times daily as needed. 180 tablet 1   omeprazole (PRILOSEC) 40 MG capsule Take 1 capsule by mouth once daily 90 capsule 0   pravastatin (PRAVACHOL) 40 MG tablet Take 1 tablet (40 mg total) by mouth daily. 90 tablet 1   rOPINIRole (REQUIP) 0.25 MG tablet Take 1 tablet (0.25 mg total) by mouth at bedtime. Take 1 tablet at night for 2 nights and then increase to 2 tablets at night for 5 days.  After that can increase to 1/3 tablet at night if needed. 30 tablet 6   traMADol (ULTRAM) 50 MG tablet Take 2 tablets (100 mg total) by mouth every 6 (six) hours as needed. 40 tablet 0   Vitamin D-Vitamin K (VITAMIN K2-VITAMIN D3 PO) Take 1 tablet by mouth daily.     aspirin 81 MG chewable tablet Chew 1 tablet (81 mg total) by mouth 2 (two) times daily. (Patient not taking: Reported on 01/11/2021) 35 tablet 0   oxyCODONE (OXY IR/ROXICODONE) 5 MG immediate release tablet Take 1 tablet (5 mg total) by mouth every 6 (six) hours as needed for moderate pain (pain score 4-6). (Patient not taking: Reported on 05/17/2021) 30 tablet 0   Current Facility-Administered Medications  Medication Dose Route Frequency Provider Last Rate Last Admin   0.9 %  sodium chloride infusion  500 mL Intravenous Once Daryel November, MD         Allergies as of 05/30/2021   (No Known Allergies)    Family History  Problem Relation Age of Onset   Dementia Mother    Heart Problems Mother    Stroke Mother    Hypertension Mother    Hypertension Father    Colon polyps Father    Diabetes Father    Hypertension Sister    Diabetes Sister        borderline   Fibromyalgia Sister    Hypertension Sister    Colon polyps Sister    Diabetes Sister    Fibromyalgia Sister    Heart Problems Sister    Arthritis Sister    Hypertension Sister    Heart Problems Sister    Diabetes Paternal Aunt  Healthy Daughter    Colon cancer Neg Hx    Esophageal cancer Neg Hx    Rectal cancer Neg Hx    Stomach cancer Neg Hx     Social History   Socioeconomic History   Marital status: Single    Spouse name: Not on file   Number of children: Not on file   Years of education: Not on file   Highest education level: Not on file  Occupational History   Not on file  Tobacco Use   Smoking status: Never   Smokeless tobacco: Never  Vaping Use   Vaping Use: Never used  Substance and Sexual Activity   Alcohol use: Not Currently   Drug use: Never   Sexual activity: Not Currently  Other Topics Concern   Not on file  Social History Narrative   Not on file   Social Determinants of Health   Financial Resource Strain: Not on file  Food Insecurity: Not on file  Transportation Needs: Not on file  Physical Activity: Not on file  Stress: Not on file  Social Connections: Not on file  Intimate Partner Violence: Not on file    Review of Systems:  All other review of systems negative except as mentioned in the HPI.  Physical Exam: Vital signs BP 127/76   Pulse 85   Temp 98.6 F (37 C) (Temporal)   Ht 4\' 11"  (1.499 m)   Wt 170 lb (77.1 kg)   SpO2 98%   BMI 34.34 kg/m   General:   Alert,  Well-developed, well-nourished, pleasant and cooperative in NAD Airway:  Mallampati 2 Lungs:  Clear throughout to auscultation.   Heart:   Regular rate and rhythm; no murmurs, clicks, rubs,  or gallops. Abdomen:  Soft, nontender and nondistended. Normal bowel sounds.   Neuro/Psych:  Normal mood and affect. A and O x 3   Maleena Eddleman E. Candis Schatz, MD [facility]

## 2021-05-30 NOTE — Progress Notes (Signed)
VS completed by DT.  Pt's states no medical or surgical changes since previsit or office visit.  

## 2021-05-30 NOTE — Patient Instructions (Addendum)
Handouts were given to your care partner on polyps and diverticulosis.  You may resume your current medications today. Await biopsy results.  May take 1-3 weeks to receive pathology results. Please call if any questions or concerns.      YOU HAD AN ENDOSCOPIC PROCEDURE TODAY AT [facility]:   Refer to the procedure report that was given to you for any specific questions about what was found during the examination.  If the procedure report does not answer your questions, please call your gastroenterologist to clarify.  If you requested that your care partner not be given the details of your procedure findings, then the procedure report has been included in a sealed envelope for you to review at your convenience later.  YOU SHOULD EXPECT: Some feelings of bloating in the abdomen. Passage of more gas than usual.  Walking can help get rid of the air that was put into your GI tract during the procedure and reduce the bloating. If you had a lower endoscopy (such as a colonoscopy or flexible sigmoidoscopy) you may notice spotting of blood in your stool or on the toilet paper. If you underwent a bowel prep for your procedure, you may not have a normal bowel movement for a few days.  Please Note:  You might notice some irritation and congestion in your nose or some drainage.  This is from the oxygen used during your procedure.  There is no need for concern and it should clear up in a day or so.  SYMPTOMS TO REPORT IMMEDIATELY:  Following lower endoscopy (colonoscopy or flexible sigmoidoscopy):  Excessive amounts of blood in the stool  Significant tenderness or worsening of abdominal pains  Swelling of the abdomen that is new, acute  Fever of 100F or higher   For urgent or emergent issues, a gastroenterologist can be reached at any hour by calling [phone]. Do not use MyChart messaging for urgent concerns.    DIET:  We do recommend a small meal at first, but then you may  proceed to your regular diet.  Drink plenty of fluids but you should avoid alcoholic beverages for 24 hours.  ACTIVITY:  You should plan to take it easy for the rest of today and you should NOT DRIVE or use heavy machinery until tomorrow (because of the sedation medicines used during the test).    FOLLOW UP: Our staff will call the number listed on your records 48-72 hours following your procedure to check on you and address any questions or concerns that you may have regarding the information given to you following your procedure. If we do not reach you, we will leave a message.  We will attempt to reach you two times.  During this call, we will ask if you have developed any symptoms of COVID 19. If you develop any symptoms (ie: fever, flu-like symptoms, shortness of breath, cough etc.) before then, please call [phone].  If you test positive for Covid 19 in the 2 weeks post procedure, please call and report this information to [place].    If any biopsies were taken you will be contacted by phone or by letter within the next 1-3 weeks.  Please call us at [phone] if you have not heard about the biopsies in 3 weeks.    SIGNATURES/CONFIDENTIALITY: You and/or your care partner have signed paperwork which will be entered into your electronic medical record.  These signatures attest to the fact that that the information above on your After  Visit Summary has been reviewed and is understood.  Full responsibility of the confidentiality of this discharge information lies with you and/or your care-partner.

## 2021-05-30 NOTE — Progress Notes (Signed)
Dr. Karl [name] forgot and did not come into the recovery room and go over the findings with the pt and her daughter before leaving the 4th floor.  I went over the findings and the discharge instrcutions with the pt and her daughter.  I asked the pt if she wanted to speak with Dr. Dustin Flock and they both said they did not need to.  That they were ready to go home.  I also phoned Dr. Candis Schatz, and he said it was ok to discharge pt without him talking with the pt or her daughter.  No problems noted in the recovery room.

## 2021-06-01 ENCOUNTER — Telehealth: Payer: Self-pay

## 2021-06-01 NOTE — Telephone Encounter (Signed)
  Follow up Call-  Call back number 05/30/2021  Post procedure Call Back phone  # [phone]  Permission to leave phone message Yes  Some recent data might be hidden     Patient questions:  Do you have a fever, pain , or abdominal swelling? No. Pain Score  0 *  Have you tolerated food without any problems? Yes.    Have you been able to return to your normal activities? Yes.    Do you have any questions about your discharge instructions: Diet   No. Medications  No. Follow up visit  No.  Do you have questions or concerns about your Care? No.  Actions: * If pain score is 4 or above: No action needed, pain <4.  Have you developed a fever since your procedure? No   2.   Have you had an respiratory symptoms (SOB or cough) since your procedure? No   3.   Have you tested positive for COVID 19 since your procedure no   4.   Have you had any family members/close contacts diagnosed with the COVID 19 since your procedure?  No    If yes to any of these questions please route to Joylene John, RN and Joella Prince, RN

## 2021-06-02 ENCOUNTER — Ambulatory Visit (INDEPENDENT_AMBULATORY_CARE_PROVIDER_SITE_OTHER): Payer: Medicaid Other | Admitting: Primary Care

## 2021-06-08 NOTE — Progress Notes (Signed)
Denise Morales,  Two polyps which I removed during your recent procedure were proven to be completely benign but are considered "pre-cancerous" polyps that MAY have grown into cancer if they had not been removed.  Studies shows that at least 20% of women over age 62 and 30% of men over age 26 have pre-cancerous polyps.  Based on current nationally recognized surveillance guidelines, I recommend that you have a repeat colonoscopy in 7 years.   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  This is a benign nerve tumor that is sometimes incidentally found in the GI tract.  It does not typically have precancerous potential and given its small size no further surveillance or further evaluation is recommended.  If you develop any new rectal bleeding, abdominal pain or significant bowel habit changes, please contact me before then.

## 2021-06-17 DIAGNOSIS — K219 Gastro-esophageal reflux disease without esophagitis: Secondary | ICD-10-CM | POA: Diagnosis not present

## 2021-06-17 DIAGNOSIS — E039 Hypothyroidism, unspecified: Secondary | ICD-10-CM | POA: Diagnosis not present

## 2021-06-17 DIAGNOSIS — M130 Polyarthritis, unspecified: Secondary | ICD-10-CM | POA: Diagnosis not present

## 2021-06-17 DIAGNOSIS — E785 Hyperlipidemia, unspecified: Secondary | ICD-10-CM | POA: Diagnosis not present

## 2021-06-17 DIAGNOSIS — I10 Essential (primary) hypertension: Secondary | ICD-10-CM | POA: Diagnosis not present

## 2021-06-17 DIAGNOSIS — F418 Other specified anxiety disorders: Secondary | ICD-10-CM | POA: Diagnosis not present

## 2021-06-17 DIAGNOSIS — K589 Irritable bowel syndrome without diarrhea: Secondary | ICD-10-CM | POA: Diagnosis not present

## 2021-06-22 ENCOUNTER — Ambulatory Visit: Payer: Medicaid Other | Admitting: Internal Medicine

## 2021-07-04 ENCOUNTER — Other Ambulatory Visit: Payer: Self-pay | Admitting: Physician Assistant

## 2021-07-04 DIAGNOSIS — M1712 Unilateral primary osteoarthritis, left knee: Secondary | ICD-10-CM

## 2021-07-04 NOTE — Progress Notes (Signed)
Sent message, via epic in basket, requesting orders in epic from surgeon.  

## 2021-07-07 NOTE — Progress Notes (Signed)
Covid test on 07/13/21 at 0930am come thru main entrance at [facility].  Have a seat in the lobby to the right.  Call [phone] and let them know you are here for covid testing and give them your name.           Your procedure is scheduled on:    07/15/2021.    Report to [facility] Main  Entrance   Report to admitting at    0700AM     Call this number if you have problems the morning of surgery [phone]    REMEMBER: NO  SOLID FOOD CANDY OR GUM AFTER MIDNIGHT. CLEAR LIQUIDS UNTIL  0645am         . NOTHING BY MOUTH EXCEPT CLEAR LIQUIDS UNTIL  0645am   . PLEASE FINISH ENSURE DRINK PER SURGEON ORDER  WHICH NEEDS TO BE COMPLETED AT      .  [id number]     CLEAR LIQUID DIET   Foods Allowed                                                                    Coffee and tea, regular and decaf                            Fruit ices (not with fruit pulp)                                      Iced Popsicles                                    Carbonated beverages, regular and diet                                    Cranberry, grape and apple juices Sports drinks like Gatorade Lightly seasoned clear broth or consume(fat free) Sugar, honey syrup ___________________________________________________________________      BRUSH YOUR TEETH MORNING OF SURGERY AND RINSE YOUR MOUTH OUT, NO CHEWING GUM CANDY OR MINTS.     Take these medicines the morning of surgery with A SIP OF WATER:  allopurinol, lexapro, synthroid, omeprazole   DO NOT TAKE ANY DIABETIC MEDICATIONS DAY OF YOUR SURGERY                               You may not have any metal on your body including hair pins and              piercings  Do not wear jewelry, make-up, lotions, powders or perfumes, deodorant             Do not wear nail polish on your fingernails.  Do not shave  48 hours prior to surgery.              Men may shave face and neck.   Do not bring valuables to the hospital. [facility] IS NOT  RESPONSIBLE   FOR VALUABLES.  Contacts, dentures or bridgework may not be worn into surgery.  Leave suitcase in the car. After surgery it may be brought to your room.     Patients discharged the day of surgery will not be allowed to drive home. IF YOU ARE HAVING SURGERY AND GOING HOME THE SAME DAY, YOU MUST HAVE AN ADULT TO DRIVE YOU HOME AND BE WITH YOU FOR 24 HOURS. YOU MAY GO HOME BY TAXI OR UBER OR ORTHERWISE, BUT AN ADULT MUST ACCOMPANY YOU HOME AND STAY WITH YOU FOR 24 HOURS.  Name and phone number of your driver:  Special Instructions: N/A              Please read over the following fact sheets you were given: _____________________________________________________________________  [facility] - Preparing for Surgery Before surgery, you can play an important role.  Because skin is not sterile, your skin needs to be as free of germs as possible.  You can reduce the number of germs on your skin by washing with CHG (chlorahexidine gluconate) soap before surgery.  CHG is an antiseptic cleaner which kills germs and bonds with the skin to continue killing germs even after washing. Please DO NOT use if you have an allergy to CHG or antibacterial soaps.  If your skin becomes reddened/irritated stop using the CHG and inform your nurse when you arrive at Short Stay. Do not shave (including legs and underarms) for at least 48 hours prior to the first CHG shower.  You may shave your face/neck. Please follow these instructions carefully:  1.  Shower with CHG Soap the night before surgery and the  morning of Surgery.  2.  If you choose to wash your hair, wash your hair first as usual with your  normal  shampoo.  3.  After you shampoo, rinse your hair and body thoroughly to remove the  shampoo.                           4.  Use CHG as you would any other liquid soap.  You can apply chg directly  to the skin and wash                       Gently with a scrungie or clean washcloth.  5.  Apply the CHG Soap  to your body ONLY FROM THE NECK DOWN.   Do not use on face/ open                           Wound or open sores. Avoid contact with eyes, ears mouth and genitals (private parts).                       Wash face,  Genitals (private parts) with your normal soap.             6.  Wash thoroughly, paying special attention to the area where your surgery  will be performed.  7.  Thoroughly rinse your body with warm water from the neck down.  8.  DO NOT shower/wash with your normal soap after using and rinsing off  the CHG Soap.                9.  Pat yourself dry with a clean towel.            10.  Wear clean pajamas.            11.  Place clean sheets on your bed the night of your first shower and do not  sleep with pets. Day of Surgery : Do not apply any lotions/deodorants the morning of surgery.  Please wear clean clothes to the hospital/surgery center.  FAILURE TO FOLLOW THESE INSTRUCTIONS MAY RESULT IN THE CANCELLATION OF YOUR SURGERY PATIENT SIGNATURE_________________________________  NURSE SIGNATURE__________________________________  ________________________________________________________________________

## 2021-07-07 NOTE — Progress Notes (Addendum)
Anesthesia Review:  PCP: Uvaldo Rising with [facility]  Cardiologist : none  Chest x-ray : EKG :07/19/20  and 07/08/21  Echo : Stress test: Cardiac Cath :  Activity level: can do a flgiht of stairs wtihout difficulty  Sleep Study/ CPAP : none  Fasting Blood Sugar :      / Checks Blood Sugar -- times a day:   Blood Thinner/ Instructions /Last Dose: ASA / Instructions/ Last Dose :   Covid test- 07/13/21 at 0930am

## 2021-07-08 ENCOUNTER — Encounter (HOSPITAL_COMMUNITY)
Admission: RE | Admit: 2021-07-08 | Discharge: 2021-07-08 | Disposition: A | Discharge status: Home / Self Care | Payer: Medicaid Other | Source: Ambulatory Visit | Attending: Orthopaedic Surgery | Admitting: Orthopaedic Surgery

## 2021-07-08 ENCOUNTER — Encounter (HOSPITAL_COMMUNITY): Payer: Self-pay

## 2021-07-08 ENCOUNTER — Other Ambulatory Visit: Payer: Self-pay

## 2021-07-08 VITALS — BP 109/61 | HR 70 | Temp 98.3°F | Resp 16 | Ht 59.0 in | Wt 184.0 lb

## 2021-07-08 DIAGNOSIS — M1712 Unilateral primary osteoarthritis, left knee: Secondary | ICD-10-CM

## 2021-07-08 DIAGNOSIS — Z20822 Contact with and (suspected) exposure to covid-19: Secondary | ICD-10-CM | POA: Diagnosis not present

## 2021-07-08 DIAGNOSIS — Z01818 Encounter for other preprocedural examination: Secondary | ICD-10-CM

## 2021-07-08 HISTORY — DX: Hypothyroidism, unspecified: E03.9

## 2021-07-08 HISTORY — DX: Fibromyalgia: M79.7

## 2021-07-08 HISTORY — DX: Anxiety disorder, unspecified: F41.9

## 2021-07-08 LAB — CBC
HCT: 36.9 % (ref 36.0–46.0)
Hemoglobin: 11.8 g/dL — ABNORMAL LOW (ref 12.0–15.0)
MCH: 27.6 pg (ref 26.0–34.0)
MCHC: 32 g/dL (ref 30.0–36.0)
MCV: 86.4 fL (ref 80.0–100.0)
Platelets: 350 10*3/uL (ref 150–400)
RBC: 4.27 MIL/uL (ref 3.87–5.11)
RDW: 14.8 % (ref 11.5–15.5)
WBC: 7.1 10*3/uL (ref 4.0–10.5)
nRBC: 0 % (ref 0.0–0.2)

## 2021-07-08 LAB — BASIC METABOLIC PANEL
Anion gap: 8 (ref 5–15)
BUN: 27 mg/dL — ABNORMAL HIGH (ref 8–23)
CO2: 25 mmol/L (ref 22–32)
Calcium: 9.2 mg/dL (ref 8.9–10.3)
Chloride: 104 mmol/L (ref 98–111)
Creatinine, Ser: 1.04 mg/dL — ABNORMAL HIGH (ref 0.44–1.00)
GFR, Estimated: >60 mL/min (ref >60)
Glucose, Bld: 90 mg/dL (ref 70–99)
Potassium: 4 mmol/L (ref 3.5–5.1)
Sodium: 137 mmol/L (ref 135–145)

## 2021-07-08 LAB — SURGICAL PCR SCREEN
MRSA, PCR: NEGATIVE
Staphylococcus aureus: NEGATIVE

## 2021-07-13 ENCOUNTER — Other Ambulatory Visit: Payer: Self-pay

## 2021-07-13 ENCOUNTER — Encounter (HOSPITAL_COMMUNITY)
Admission: RE | Admit: 2021-07-13 | Discharge: 2021-07-13 | Disposition: A | Discharge status: Home / Self Care | Payer: Medicaid Other | Source: Ambulatory Visit | Attending: Orthopaedic Surgery | Admitting: Orthopaedic Surgery

## 2021-07-13 DIAGNOSIS — Z01812 Encounter for preprocedural laboratory examination: Secondary | ICD-10-CM | POA: Diagnosis not present

## 2021-07-13 DIAGNOSIS — Z20822 Contact with and (suspected) exposure to covid-19: Secondary | ICD-10-CM | POA: Diagnosis not present

## 2021-07-13 DIAGNOSIS — Z01818 Encounter for other preprocedural examination: Secondary | ICD-10-CM

## 2021-07-13 LAB — SARS CORONAVIRUS 2 (TAT 6-24 HRS): SARS Coronavirus 2: NEGATIVE

## 2021-07-14 NOTE — H&P (Signed)
TOTAL KNEE ADMISSION H&P  Patient is being admitted for left total knee arthroplasty.  Subjective:  Chief Complaint:left knee pain.  HPI: Denise Morales, 61 y.o. female, has a history of pain and functional disability in the left knee due to arthritis and has failed non-surgical conservative treatments for greater than 12 weeks to includeNSAID's and/or analgesics, corticosteriod injections, viscosupplementation injections, flexibility and strengthening excercises, use of assistive devices, weight reduction as appropriate, and activity modification.  Onset of symptoms was gradual, starting 3 years ago with gradually worsening course since that time. The patient noted no past surgery on the left knee(s).  Patient currently rates pain in the left knee(s) at 10 out of 10 with activity. Patient has night pain, worsening of pain with activity and weight bearing, pain that interferes with activities of daily living, pain with passive range of motion, crepitus, and joint swelling.  Patient has evidence of subchondral sclerosis, periarticular osteophytes, and joint space narrowing by imaging studies. There is no active infection.  Patient Active Problem List   Diagnosis Date Noted   Neoplasm of uncertain behavior of skin 05/25/2021   Generalized osteoarthritis of multiple sites 05/25/2021   Fibromyalgia syndrome 05/25/2021   Restless legs 03/16/2021   Hyperlipidemia 03/16/2021   Anxiety 03/16/2021   Status post right knee replacement 12/24/2020   Unilateral primary osteoarthritis, left knee 11/11/2020   Unilateral primary osteoarthritis, right knee 11/11/2020   Complete tear of right rotator cuff 08/12/2020   Hypertensive disorder 05/05/2019   Past Medical History:  Diagnosis Date   Anxiety    Arthritis    Depression    Fibromyalgia    GERD (gastroesophageal reflux disease)    Hyperlipidemia    Hypertension    Hypothyroidism    Melanoma ([place])    face   Thyroid disease     Past Surgical  History:  Procedure Laterality Date   ABDOMINAL HYSTERECTOMY  1985   CHOLECYSTECTOMY     COLONOSCOPY     SHOULDER ARTHROSCOPY WITH ROTATOR CUFF REPAIR AND SUBACROMIAL DECOMPRESSION Right 08/12/2020   Procedure: RIGHT SHOULDER ARTHROSCOPY WITH ROTATOR CUFF REPAIR AND SUBACROMIAL DECOMPRESSION;  Surgeon: Mcarthur Rossetti, MD;  Location: [facility];  Service: Orthopedics;  Laterality: Right;   TOTAL KNEE ARTHROPLASTY Right 12/24/2020   Procedure: RIGHT TOTAL KNEE ARTHROPLASTY;  Surgeon: Mcarthur Rossetti, MD;  Location: [facility];  Service: Orthopedics;  Laterality: Right;    No current facility-administered medications for this encounter.   Current Outpatient Medications  Medication Sig Dispense Refill Last Dose   acetaminophen (TYLENOL) 500 MG tablet Take 1,000 mg by mouth every 6 (six) hours as needed for moderate pain or headache.      allopurinol (ZYLOPRIM) 100 MG tablet Take 100 mg by mouth daily.      baclofen (LIORESAL) 10 MG tablet Take 1 tablet (10 mg total) by mouth 3 (three) times daily as needed for muscle spasms. 1 (ONE) TABLET BY MOUTH THREE TIMES DAILY AS NEEDED FOR MUSCLE SPASMS (Patient taking differently: Take 10 mg by mouth 2 (two) times daily.) 60 tablet 0    dicyclomine (BENTYL) 10 MG capsule Take 10 mg by mouth every 6 (six) hours as needed for spasms.      diphenhydrAMINE (BENADRYL) 25 MG tablet Take 25 mg by mouth at bedtime.      diphenhydrAMINE HCl, Sleep, 50 MG CAPS Take 50 mg by mouth at bedtime.      escitalopram (LEXAPRO) 20 MG tablet Take 1 tablet (20 mg total) by  mouth daily. 90 tablet 1    hydrOXYzine (ATARAX/VISTARIL) 25 MG tablet TAKE 1 TABLET BY MOUTH AT BEDTIME AS NEEDED (Patient taking differently: Take 25 mg by mouth 3 (three) times daily as needed for anxiety.) 30 tablet 1    KRILL OIL PO Take 1 capsule by mouth daily.      Lactobacillus (ACIDOPHILUS PO) Take 1 capsule by mouth daily.      levothyroxine (SYNTHROID) 50 MCG tablet  Take 50 mcg by mouth daily.      lisinopril-hydrochlorothiazide (ZESTORETIC) 10-12.5 MG tablet Take 1 tablet by mouth daily. 90 tablet 1    nabumetone (RELAFEN) 500 MG tablet Take 1 tablet (500 mg total) by mouth 2 (two) times daily as needed. 180 tablet 1    naproxen sodium (ALEVE) 220 MG tablet Take 220 mg by mouth 2 (two) times daily.      omeprazole (PRILOSEC) 40 MG capsule Take 1 capsule by mouth once daily 90 capsule 0    pravastatin (PRAVACHOL) 40 MG tablet Take 1 tablet (40 mg total) by mouth daily. 90 tablet 1    rOPINIRole (REQUIP) 0.25 MG tablet Take 1 tablet (0.25 mg total) by mouth at bedtime. Take 1 tablet at night for 2 nights and then increase to 2 tablets at night for 5 days.  After that can increase to 1/3 tablet at night if needed. (Patient taking differently: Take 0.25 mg by mouth at bedtime.) 30 tablet 6    traMADol (ULTRAM) 50 MG tablet Take 2 tablets (100 mg total) by mouth every 6 (six) hours as needed. 40 tablet 0    Turmeric 500 MG CAPS Take 500 mg by mouth daily.      Vitamin D-Vitamin K (VITAMIN K2-VITAMIN D3 PO) Take 1 tablet by mouth daily.      No Known Allergies  Social History   Tobacco Use   Smoking status: Never   Smokeless tobacco: Never  Substance Use Topics   Alcohol use: Not Currently    Family History  Problem Relation Age of Onset   Dementia Mother    Heart Problems Mother    Stroke Mother    Hypertension Mother    Hypertension Father    Colon polyps Father    Diabetes Father    Hypertension Sister    Diabetes Sister        borderline   Fibromyalgia Sister    Hypertension Sister    Colon polyps Sister    Diabetes Sister    Fibromyalgia Sister    Heart Problems Sister    Arthritis Sister    Hypertension Sister    Heart Problems Sister    Diabetes Paternal Aunt    Healthy Daughter    Colon cancer Neg Hx    Esophageal cancer Neg Hx    Rectal cancer Neg Hx    Stomach cancer Neg Hx      Review of Systems  Musculoskeletal:  Positive  for gait problem and joint swelling.  All other systems reviewed and are negative.  Objective:  Physical Exam Vitals reviewed.  Constitutional:      Appearance: Normal appearance.  HENT:     Head: Normocephalic and atraumatic.  Eyes:     Extraocular Movements: Extraocular movements intact.  Cardiovascular:     Rate and Rhythm: Normal rate and regular rhythm.  Pulmonary:     Effort: Pulmonary effort is normal.     Breath sounds: Normal breath sounds.  Abdominal:     Palpations: Abdomen is soft.  Musculoskeletal:     Cervical back: Normal range of motion and neck supple.     Left knee: Effusion, bony tenderness and crepitus present. Decreased range of motion. Tenderness present over the medial joint line, lateral joint line and patellar tendon.  Neurological:     Mental Status: She is alert and oriented to person, place, and time.  Psychiatric:        Behavior: Behavior normal.    Vital signs in last 24 hours:    Labs:   Estimated body mass index is 37.16 kg/m as calculated from the following:   Height as of 07/08/21: 4\' 11"  (1.499 m).   Weight as of 07/08/21: 83.5 kg.   Imaging Review Plain radiographs demonstrate severe degenerative joint disease of the left knee(s). The overall alignment ismild varus. The bone quality appears to be good for age and reported activity level.      Assessment/Plan:  End stage arthritis, left knee   The patient history, physical examination, clinical judgment of the provider and imaging studies are consistent with end stage degenerative joint disease of the left knee(s) and total knee arthroplasty is deemed medically necessary. The treatment options including medical management, injection therapy arthroscopy and arthroplasty were discussed at length. The risks and benefits of total knee arthroplasty were presented and reviewed. The risks due to aseptic loosening, infection, stiffness, patella tracking problems, thromboembolic  complications and other imponderables were discussed. The patient acknowledged the explanation, agreed to proceed with the plan and consent was signed. Patient is being admitted for inpatient treatment for surgery, pain control, PT, OT, prophylactic antibiotics, VTE prophylaxis, progressive ambulation and ADL's and discharge planning. The patient is planning to be discharged home with home health services

## 2021-07-15 ENCOUNTER — Ambulatory Visit (HOSPITAL_COMMUNITY): Payer: Medicaid Other | Admitting: Anesthesiology

## 2021-07-15 ENCOUNTER — Encounter (HOSPITAL_COMMUNITY): Payer: Self-pay | Admitting: Orthopaedic Surgery

## 2021-07-15 ENCOUNTER — Encounter (HOSPITAL_COMMUNITY)
Admission: RE | Disposition: A | Discharge status: Home / Self Care | Payer: Self-pay | Source: Ambulatory Visit | Attending: Orthopaedic Surgery

## 2021-07-15 ENCOUNTER — Observation Stay (HOSPITAL_COMMUNITY)
Admission: RE | Admit: 2021-07-15 | Discharge: 2021-07-16 | Disposition: A | Discharge status: Home / Self Care | Payer: Medicaid Other | Source: Ambulatory Visit | Attending: Orthopaedic Surgery | Admitting: Orthopaedic Surgery

## 2021-07-15 ENCOUNTER — Other Ambulatory Visit: Payer: Self-pay

## 2021-07-15 ENCOUNTER — Observation Stay (HOSPITAL_COMMUNITY): Payer: Medicaid Other

## 2021-07-15 DIAGNOSIS — G8918 Other acute postprocedural pain: Secondary | ICD-10-CM | POA: Diagnosis not present

## 2021-07-15 DIAGNOSIS — Z96651 Presence of right artificial knee joint: Secondary | ICD-10-CM | POA: Insufficient documentation

## 2021-07-15 DIAGNOSIS — E039 Hypothyroidism, unspecified: Secondary | ICD-10-CM | POA: Insufficient documentation

## 2021-07-15 DIAGNOSIS — Z79899 Other long term (current) drug therapy: Secondary | ICD-10-CM | POA: Insufficient documentation

## 2021-07-15 DIAGNOSIS — Z471 Aftercare following joint replacement surgery: Secondary | ICD-10-CM | POA: Diagnosis not present

## 2021-07-15 DIAGNOSIS — Z96642 Presence of left artificial hip joint: Secondary | ICD-10-CM | POA: Diagnosis not present

## 2021-07-15 DIAGNOSIS — I10 Essential (primary) hypertension: Secondary | ICD-10-CM | POA: Insufficient documentation

## 2021-07-15 DIAGNOSIS — M1712 Unilateral primary osteoarthritis, left knee: Principal | ICD-10-CM

## 2021-07-15 DIAGNOSIS — Z96652 Presence of left artificial knee joint: Secondary | ICD-10-CM | POA: Diagnosis not present

## 2021-07-15 HISTORY — PX: TOTAL KNEE ARTHROPLASTY: SHX125

## 2021-07-15 LAB — TYPE AND SCREEN
ABO/RH(D): O POS
Antibody Screen: NEGATIVE

## 2021-07-15 IMAGING — DX DG KNEE 1-2V PORT*L*
2 series · 2 of 2 positions shown · non-contrast
Comparison: [DATE]

CLINICAL DATA: Status post left knee arthroplasty

EXAM:
PORTABLE LEFT KNEE - 1-2 VIEW

[knee ap]
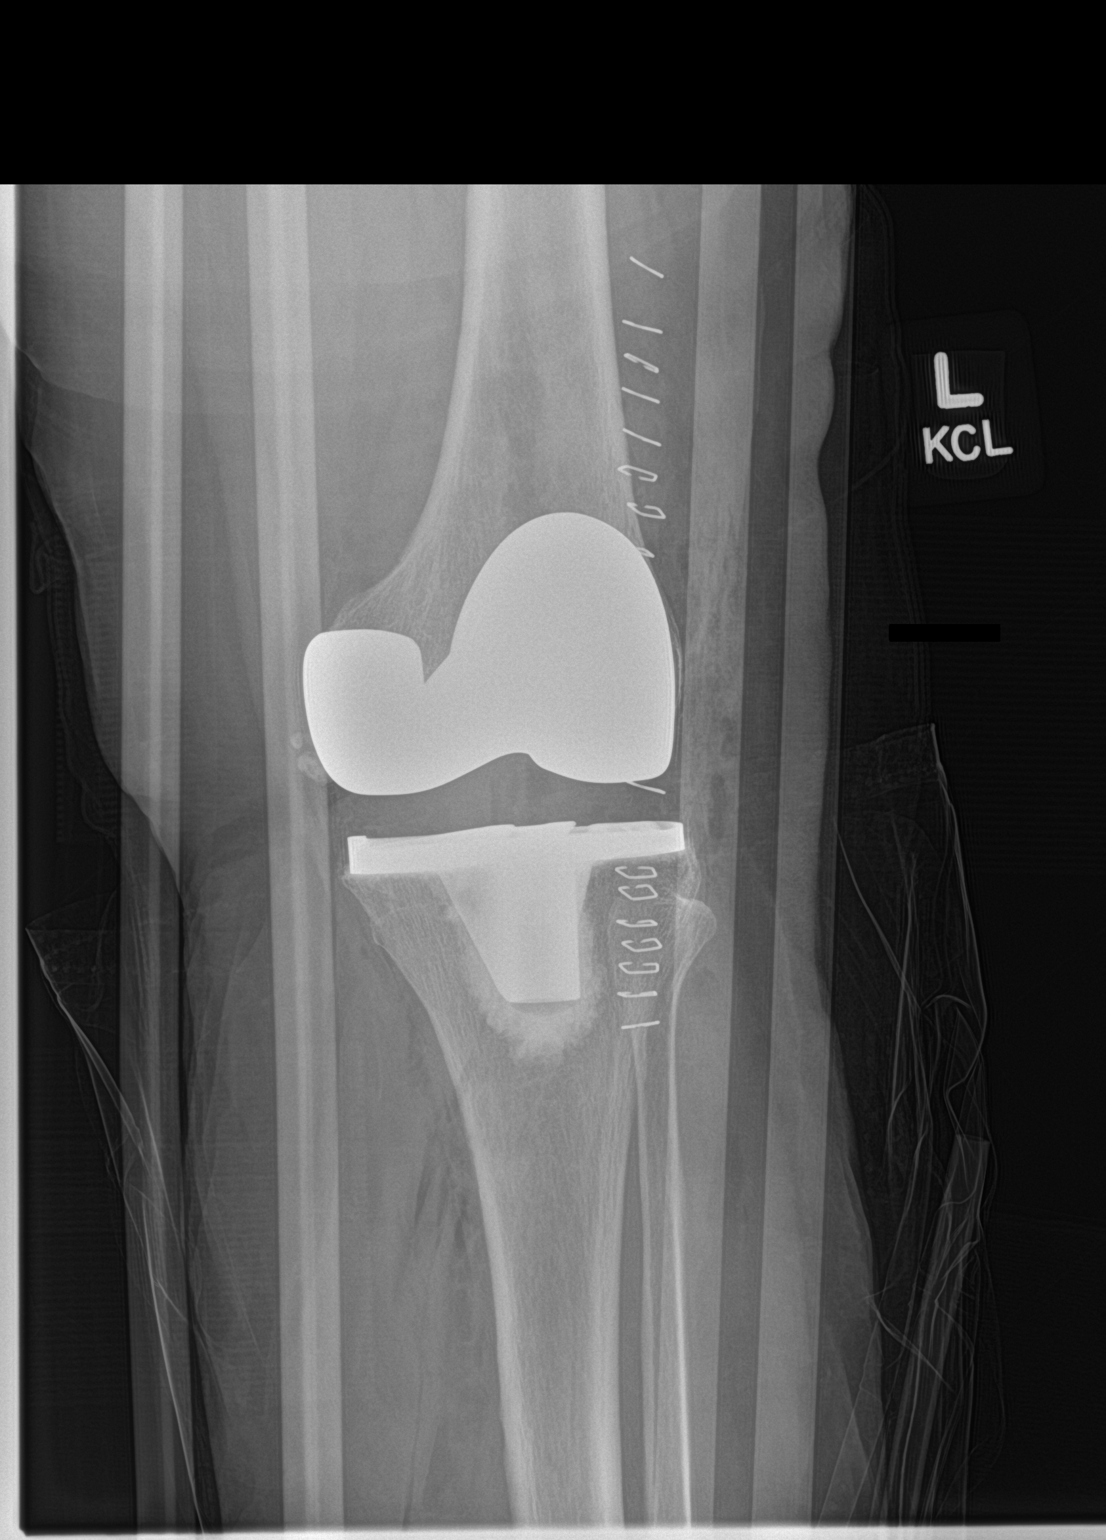

[knee lat]
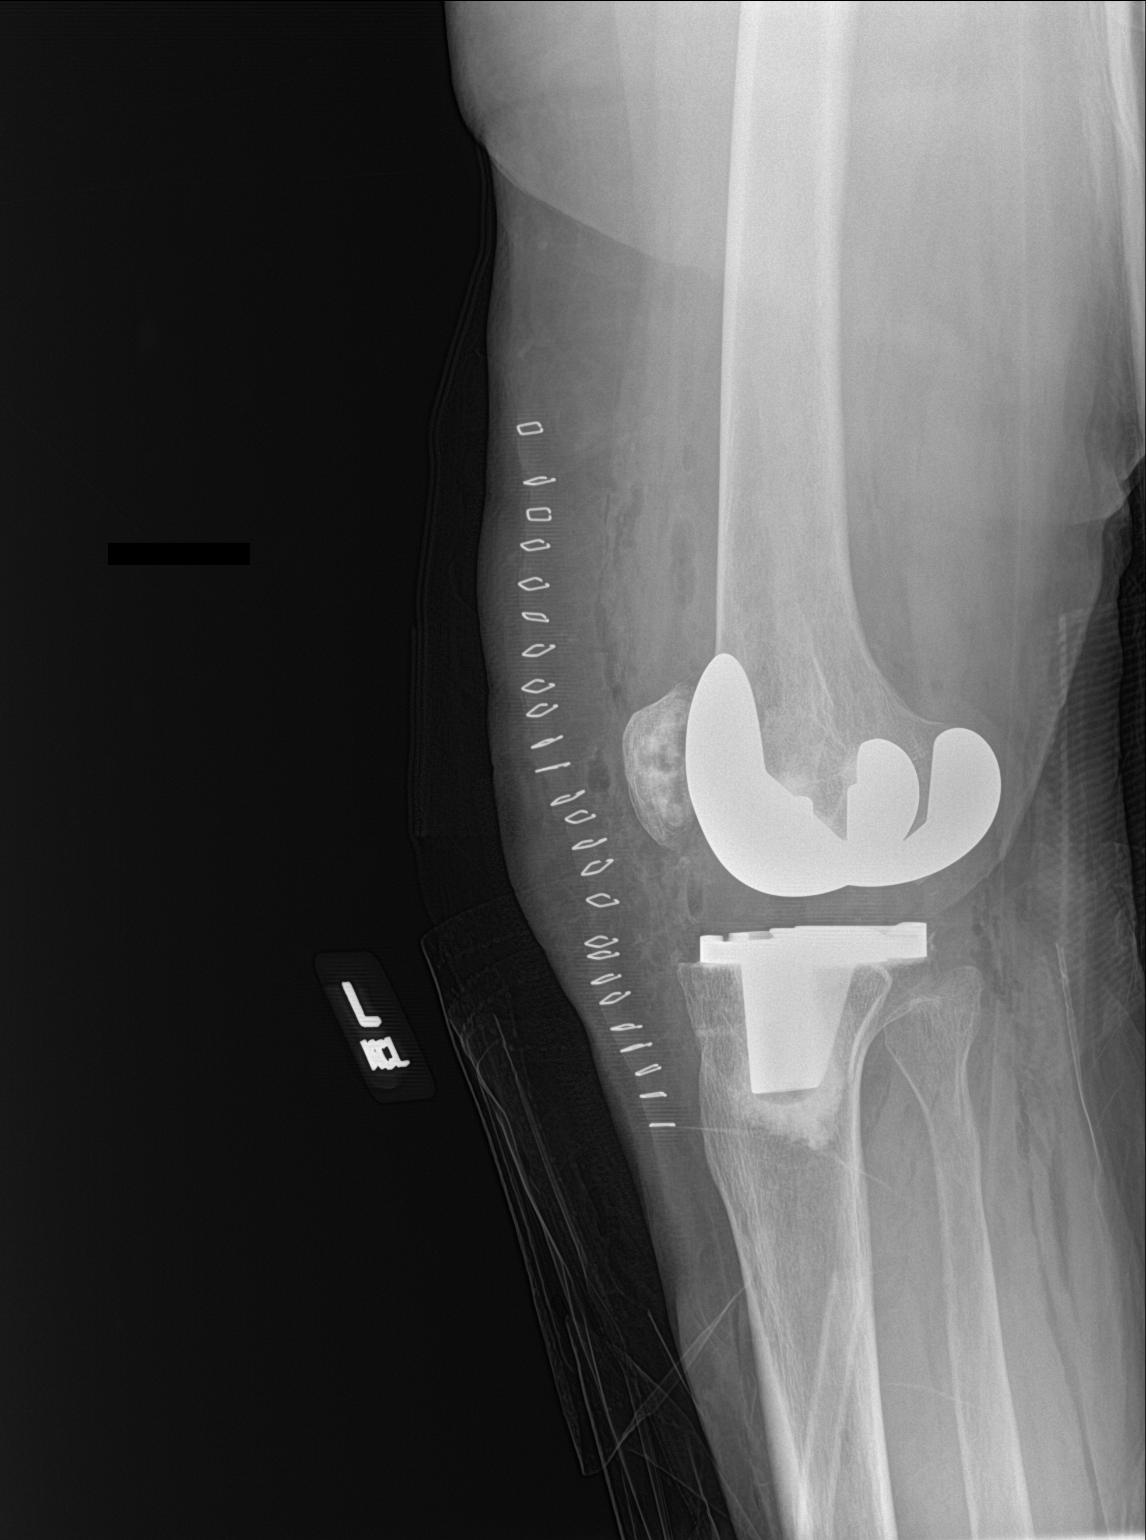

[2 of 2 positions shown; findings below may reference images not displayed]

FINDINGS: There is interval left knee arthroplasty. There are pockets of air
in the soft tissues. Skin staples are seen. No fracture is seen.
IMPRESSION: Status post left knee arthroplasty.

## 2021-07-15 SURGERY — ARTHROPLASTY, KNEE, TOTAL
Anesthesia: Spinal | Site: Knee | Laterality: Left

## 2021-07-15 MED ORDER — PHENYLEPHRINE HCL-NACL 20-0.9 MG/250ML-% IV SOLN
INTRAVENOUS | Status: DC | PRN
  Administered 2021-07-15: 60 ug/min via INTRAVENOUS

## 2021-07-15 MED ORDER — SODIUM CHLORIDE 0.9 % IR SOLN
Status: DC | PRN
  Administered 2021-07-15: 1000 mL

## 2021-07-15 MED ORDER — ALUM & MAG HYDROXIDE-SIMETH 200-200-20 MG/5ML PO SUSP: 30.0000 mL | ORAL | Status: DC | PRN

## 2021-07-15 MED ORDER — ESCITALOPRAM OXALATE 20 MG PO TABS
20.0000 mg | ORAL_TABLET | Freq: Every day | ORAL | Status: DC
  Administered 2021-07-15: 20 mg via ORAL
  Filled 2021-07-15: qty 1

## 2021-07-15 MED ORDER — FENTANYL CITRATE PF 50 MCG/ML IJ SOSY
PREFILLED_SYRINGE | INTRAMUSCULAR | Status: AC
  Administered 2021-07-15: 100 ug
  Filled 2021-07-15: qty 2

## 2021-07-15 MED ORDER — BUPIVACAINE-EPINEPHRINE (PF) 0.5% -1:200000 IJ SOLN
INTRAMUSCULAR | Status: DC | PRN
  Administered 2021-07-15: 20 mL via PERINEURAL

## 2021-07-15 MED ORDER — DEXAMETHASONE SODIUM PHOSPHATE 10 MG/ML IJ SOLN
INTRAMUSCULAR | Status: DC | PRN
  Administered 2021-07-15: 10 mg via INTRAVENOUS

## 2021-07-15 MED ORDER — PHENYLEPHRINE HCL-NACL 20-0.9 MG/250ML-% IV SOLN
INTRAVENOUS | Status: AC
  Filled 2021-07-15: qty 250

## 2021-07-15 MED ORDER — HYDROMORPHONE HCL 1 MG/ML IJ SOLN
1.0000 mg | INTRAMUSCULAR | Status: DC | PRN
  Administered 2021-07-15: 1 mg via INTRAVENOUS
  Administered 2021-07-16: 2 mg via INTRAVENOUS
  Filled 2021-07-15: qty 2
  Filled 2021-07-15: qty 1

## 2021-07-15 MED ORDER — HYDROMORPHONE HCL 1 MG/ML IJ SOLN
0.5000 mg | INTRAMUSCULAR | Status: DC | PRN
  Administered 2021-07-15: 1 mg via INTRAVENOUS
  Filled 2021-07-15: qty 1

## 2021-07-15 MED ORDER — ALLOPURINOL 100 MG PO TABS
100.0000 mg | ORAL_TABLET | Freq: Every day | ORAL | Status: DC
  Administered 2021-07-15 – 2021-07-16 (×2): 100 mg via ORAL
  Filled 2021-07-15 (×2): qty 1

## 2021-07-15 MED ORDER — ONDANSETRON HCL 4 MG/2ML IJ SOLN
INTRAMUSCULAR | Status: DC | PRN
Start: 2021-07-15 — End: 2021-07-15
  Administered 2021-07-15: 4 mg via INTRAVENOUS

## 2021-07-15 MED ORDER — PROPOFOL 1000 MG/100ML IV EMUL
INTRAVENOUS | Status: AC
  Filled 2021-07-15: qty 100

## 2021-07-15 MED ORDER — CEFAZOLIN SODIUM-DEXTROSE 1-4 GM/50ML-% IV SOLN
1.0000 g | Freq: Four times a day (QID) | INTRAVENOUS | Status: AC
  Administered 2021-07-15 (×2): 1 g via INTRAVENOUS
  Filled 2021-07-15 (×2): qty 50

## 2021-07-15 MED ORDER — ONDANSETRON HCL 4 MG PO TABS: 4.0000 mg | ORAL_TABLET | Freq: Four times a day (QID) | ORAL | Status: DC | PRN

## 2021-07-15 MED ORDER — LEVOTHYROXINE SODIUM 50 MCG PO TABS
50.0000 ug | ORAL_TABLET | Freq: Every day | ORAL | Status: DC
  Administered 2021-07-16: 50 ug via ORAL
  Filled 2021-07-15: qty 1

## 2021-07-15 MED ORDER — TRANEXAMIC ACID-NACL 1000-0.7 MG/100ML-% IV SOLN
1000.0000 mg | INTRAVENOUS | Status: AC
  Administered 2021-07-15: 1000 mg via INTRAVENOUS
  Filled 2021-07-15: qty 100

## 2021-07-15 MED ORDER — DIPHENHYDRAMINE HCL 12.5 MG/5ML PO ELIX
12.5000 mg | ORAL_SOLUTION | ORAL | Status: DC | PRN
  Administered 2021-07-16 (×3): 25 mg via ORAL
  Filled 2021-07-15 (×3): qty 10

## 2021-07-15 MED ORDER — MIDAZOLAM HCL 2 MG/2ML IJ SOLN
INTRAMUSCULAR | Status: AC
  Filled 2021-07-15: qty 2

## 2021-07-15 MED ORDER — 0.9 % SODIUM CHLORIDE (POUR BTL) OPTIME
TOPICAL | Status: DC | PRN
  Administered 2021-07-15: 1000 mL

## 2021-07-15 MED ORDER — SODIUM CHLORIDE 0.9 % IV SOLN: INTRAVENOUS | Status: DC

## 2021-07-15 MED ORDER — POLYETHYLENE GLYCOL 3350 17 G PO PACK: 17.0000 g | PACK | Freq: Every day | ORAL | Status: DC | PRN

## 2021-07-15 MED ORDER — ORAL CARE MOUTH RINSE: 15.0000 mL | Freq: Once | OROMUCOSAL | Status: AC

## 2021-07-15 MED ORDER — ROPINIROLE HCL 0.5 MG PO TABS
0.2500 mg | ORAL_TABLET | Freq: Every day | ORAL | Status: DC
  Administered 2021-07-15: 0.25 mg via ORAL
  Filled 2021-07-15: qty 1

## 2021-07-15 MED ORDER — HYDROCHLOROTHIAZIDE 12.5 MG PO TABS
12.5000 mg | ORAL_TABLET | Freq: Every day | ORAL | Status: DC
  Administered 2021-07-15: 12.5 mg via ORAL
  Filled 2021-07-15 (×2): qty 1

## 2021-07-15 MED ORDER — ACETAMINOPHEN 500 MG PO TABS
1000.0000 mg | ORAL_TABLET | Freq: Once | ORAL | Status: AC
  Administered 2021-07-15: 1000 mg via ORAL
  Filled 2021-07-15: qty 2

## 2021-07-15 MED ORDER — LIDOCAINE HCL (PF) 2 % IJ SOLN
INTRAMUSCULAR | Status: AC
  Filled 2021-07-15: qty 5

## 2021-07-15 MED ORDER — OXYCODONE HCL 5 MG PO TABS
10.0000 mg | ORAL_TABLET | ORAL | Status: DC | PRN
  Administered 2021-07-15 – 2021-07-16 (×2): 15 mg via ORAL
  Filled 2021-07-15 (×2): qty 3

## 2021-07-15 MED ORDER — PHENOL 1.4 % MT LIQD: 1.0000 | OROMUCOSAL | Status: DC | PRN

## 2021-07-15 MED ORDER — BUPIVACAINE IN DEXTROSE 0.75-8.25 % IT SOLN
INTRATHECAL | Status: DC | PRN
  Administered 2021-07-15: 1.6 mL via INTRATHECAL

## 2021-07-15 MED ORDER — HYDROXYZINE HCL 25 MG PO TABS
25.0000 mg | ORAL_TABLET | Freq: Three times a day (TID) | ORAL | Status: DC | PRN
  Administered 2021-07-16: 25 mg via ORAL
  Filled 2021-07-15: qty 1

## 2021-07-15 MED ORDER — PROPOFOL 500 MG/50ML IV EMUL
INTRAVENOUS | Status: DC | PRN
  Administered 2021-07-15: 50 ug/kg/min via INTRAVENOUS

## 2021-07-15 MED ORDER — METOCLOPRAMIDE HCL 5 MG PO TABS: 5.0000 mg | ORAL_TABLET | Freq: Three times a day (TID) | ORAL | Status: DC | PRN

## 2021-07-15 MED ORDER — POVIDONE-IODINE 10 % EX SWAB
2.0000 "application " | Freq: Once | CUTANEOUS | Status: AC
  Administered 2021-07-15: 2 via TOPICAL

## 2021-07-15 MED ORDER — OXYCODONE HCL 5 MG PO TABS
5.0000 mg | ORAL_TABLET | ORAL | Status: DC | PRN
  Administered 2021-07-15: 10 mg via ORAL
  Administered 2021-07-15: 5 mg via ORAL
  Administered 2021-07-16 (×3): 10 mg via ORAL
  Filled 2021-07-15: qty 1
  Filled 2021-07-15 (×4): qty 2

## 2021-07-15 MED ORDER — LIDOCAINE HCL (CARDIAC) PF 100 MG/5ML IV SOSY
PREFILLED_SYRINGE | INTRAVENOUS | Status: DC | PRN
  Administered 2021-07-15: 40 mg via INTRAVENOUS

## 2021-07-15 MED ORDER — HYDROMORPHONE HCL 1 MG/ML IJ SOLN: 0.2500 mg | INTRAMUSCULAR | Status: DC | PRN

## 2021-07-15 MED ORDER — LACTATED RINGERS IV SOLN: INTRAVENOUS | Status: DC

## 2021-07-15 MED ORDER — DICYCLOMINE HCL 10 MG PO CAPS
10.0000 mg | ORAL_CAPSULE | Freq: Four times a day (QID) | ORAL | Status: DC | PRN
  Filled 2021-07-15: qty 1

## 2021-07-15 MED ORDER — BACLOFEN 10 MG PO TABS
10.0000 mg | ORAL_TABLET | Freq: Three times a day (TID) | ORAL | Status: DC | PRN
  Administered 2021-07-16: 10 mg via ORAL
  Filled 2021-07-15: qty 1

## 2021-07-15 MED ORDER — MIDAZOLAM HCL 2 MG/2ML IJ SOLN
INTRAMUSCULAR | Status: AC
  Administered 2021-07-15: 1 mg
  Filled 2021-07-15: qty 2

## 2021-07-15 MED ORDER — MENTHOL 3 MG MT LOZG: 1.0000 | LOZENGE | OROMUCOSAL | Status: DC | PRN

## 2021-07-15 MED ORDER — CHLORHEXIDINE GLUCONATE 0.12 % MT SOLN
15.0000 mL | Freq: Once | OROMUCOSAL | Status: AC
  Administered 2021-07-15: 08:00:00 15 mL via OROMUCOSAL

## 2021-07-15 MED ORDER — DEXAMETHASONE SODIUM PHOSPHATE 10 MG/ML IJ SOLN
INTRAMUSCULAR | Status: AC
  Filled 2021-07-15: qty 1

## 2021-07-15 MED ORDER — ONDANSETRON HCL 4 MG/2ML IJ SOLN: 4.0000 mg | Freq: Four times a day (QID) | INTRAMUSCULAR | Status: DC | PRN

## 2021-07-15 MED ORDER — FENTANYL CITRATE (PF) 100 MCG/2ML IJ SOLN
INTRAMUSCULAR | Status: DC | PRN
Start: 2021-07-15 — End: 2021-07-15
  Administered 2021-07-15: 50 ug via INTRAVENOUS
  Administered 2021-07-15: 25 ug via INTRAVENOUS

## 2021-07-15 MED ORDER — LISINOPRIL 10 MG PO TABS
10.0000 mg | ORAL_TABLET | Freq: Every day | ORAL | Status: DC
  Administered 2021-07-15: 10 mg via ORAL
  Filled 2021-07-15 (×2): qty 1

## 2021-07-15 MED ORDER — PRAVASTATIN SODIUM 20 MG PO TABS
40.0000 mg | ORAL_TABLET | Freq: Every day | ORAL | Status: DC
  Administered 2021-07-15: 40 mg via ORAL
  Filled 2021-07-15: qty 2

## 2021-07-15 MED ORDER — DOCUSATE SODIUM 100 MG PO CAPS
100.0000 mg | ORAL_CAPSULE | Freq: Two times a day (BID) | ORAL | Status: DC
  Administered 2021-07-15 – 2021-07-16 (×2): 100 mg via ORAL
  Filled 2021-07-15 (×2): qty 1

## 2021-07-15 MED ORDER — BUPIVACAINE-EPINEPHRINE (PF) 0.25% -1:200000 IJ SOLN
INTRAMUSCULAR | Status: AC
  Filled 2021-07-15: qty 30

## 2021-07-15 MED ORDER — ACETAMINOPHEN 325 MG PO TABS
325.0000 mg | ORAL_TABLET | Freq: Four times a day (QID) | ORAL | Status: DC | PRN
  Administered 2021-07-16: 650 mg via ORAL
  Filled 2021-07-15: qty 2

## 2021-07-15 MED ORDER — FENTANYL CITRATE (PF) 100 MCG/2ML IJ SOLN
INTRAMUSCULAR | Status: AC
  Filled 2021-07-15: qty 2

## 2021-07-15 MED ORDER — ASPIRIN 81 MG PO CHEW
81.0000 mg | CHEWABLE_TABLET | Freq: Two times a day (BID) | ORAL | Status: DC
  Administered 2021-07-15 – 2021-07-16 (×2): 81 mg via ORAL
  Filled 2021-07-15 (×2): qty 1

## 2021-07-15 MED ORDER — CEFAZOLIN SODIUM-DEXTROSE 2-4 GM/100ML-% IV SOLN
2.0000 g | INTRAVENOUS | Status: AC
  Administered 2021-07-15: 2 g via INTRAVENOUS
  Filled 2021-07-15: qty 100

## 2021-07-15 MED ORDER — STERILE WATER FOR IRRIGATION IR SOLN
Status: DC | PRN
  Administered 2021-07-15: 2000 mL

## 2021-07-15 MED ORDER — PROPOFOL 10 MG/ML IV BOLUS
INTRAVENOUS | Status: DC | PRN
  Administered 2021-07-15 (×2): 10 mg via INTRAVENOUS

## 2021-07-15 MED ORDER — MIDAZOLAM HCL 5 MG/5ML IJ SOLN
INTRAMUSCULAR | Status: DC | PRN
  Administered 2021-07-15 (×2): 1 mg via INTRAVENOUS

## 2021-07-15 MED ORDER — PANTOPRAZOLE SODIUM 40 MG PO TBEC
40.0000 mg | DELAYED_RELEASE_TABLET | Freq: Every day | ORAL | Status: DC
  Administered 2021-07-15 – 2021-07-16 (×2): 40 mg via ORAL
  Filled 2021-07-15 (×2): qty 1

## 2021-07-15 MED ORDER — LISINOPRIL-HYDROCHLOROTHIAZIDE 10-12.5 MG PO TABS
1.0000 | ORAL_TABLET | Freq: Every day | ORAL | Status: DC
Start: 2021-07-15 — End: 2021-07-15

## 2021-07-15 MED ORDER — METOCLOPRAMIDE HCL 5 MG/ML IJ SOLN: 5.0000 mg | Freq: Three times a day (TID) | INTRAMUSCULAR | Status: DC | PRN

## 2021-07-15 SURGICAL SUPPLY — 64 items
APL SKNCLS STERI-STRIP NONHPOA (GAUZE/BANDAGES/DRESSINGS)
BAG COUNTER SPONGE SURGICOUNT (BAG) IMPLANT
BAG SPEC THK2 15X12 ZIP CLS (MISCELLANEOUS) ×1
BAG SPNG CNTER NS LX DISP (BAG)
BAG SURGICOUNT SPONGE COUNTING (BAG)
BAG ZIPLOCK 12X15 (MISCELLANEOUS) ×4 IMPLANT
BENZOIN TINCTURE PRP APPL 2/3 (GAUZE/BANDAGES/DRESSINGS) IMPLANT
BLADE SAG 18X100X1.27 (BLADE) ×4 IMPLANT
BLADE SURG SZ10 CARB STEEL (BLADE) ×8 IMPLANT
BNDG ELASTIC 6X5.8 VLCR STR LF (GAUZE/BANDAGES/DRESSINGS) ×6 IMPLANT
BOWL SMART MIX CTS (DISPOSABLE) ×2 IMPLANT
BSPLAT TIB 5D C CMNT STM LT (Knees) ×1 IMPLANT
CEMENT BONE R 1X40 (Cement) ×4 IMPLANT
CLOSURE WOUND 1/2 X4 (GAUZE/BANDAGES/DRESSINGS)
COMP FEM CMT PS STD 5 LT (Joint) ×3 IMPLANT
COMPONENT FEM CMT PS STD 5 LT (Joint) IMPLANT
COOLER ICEMAN CLASSIC (MISCELLANEOUS) ×4 IMPLANT
COVER SURGICAL LIGHT HANDLE (MISCELLANEOUS) ×4 IMPLANT
CUFF TOURN SGL QUICK 34 (TOURNIQUET CUFF) ×3
CUFF TRNQT CYL 34X4.125X (TOURNIQUET CUFF) ×2 IMPLANT
DECANTER SPIKE VIAL GLASS SM (MISCELLANEOUS) IMPLANT
DRAPE INCISE IOBAN 66X45 STRL (DRAPES) ×4 IMPLANT
DRAPE U-SHAPE 47X51 STRL (DRAPES) ×4 IMPLANT
DRSG PAD ABDOMINAL 8X10 ST (GAUZE/BANDAGES/DRESSINGS) ×8 IMPLANT
DURAPREP 26ML APPLICATOR (WOUND CARE) ×4 IMPLANT
ELECT BLADE TIP CTD 4 INCH (ELECTRODE) ×4 IMPLANT
ELECT REM PT RETURN 15FT ADLT (MISCELLANEOUS) ×4 IMPLANT
GAUZE SPONGE 4X4 12PLY STRL (GAUZE/BANDAGES/DRESSINGS) ×4 IMPLANT
GAUZE XEROFORM 1X8 LF (GAUZE/BANDAGES/DRESSINGS) ×2 IMPLANT
GLOVE SRG 8 PF TXTR STRL LF DI (GLOVE) ×4 IMPLANT
GLOVE SURG ENC MOIS LTX SZ7.5 (GLOVE) ×4 IMPLANT
GLOVE SURG NEOPR MICRO LF SZ8 (GLOVE) ×4 IMPLANT
GLOVE SURG UNDER POLY LF SZ8 (GLOVE) ×6
GOWN STRL REUS W/TWL XL LVL3 (GOWN DISPOSABLE) ×8 IMPLANT
HANDPIECE INTERPULSE COAX TIP (DISPOSABLE) ×3
HDLS TROCR DRIL PIN KNEE 75 (PIN) ×6
HOLDER FOLEY CATH W/STRAP (MISCELLANEOUS) ×2 IMPLANT
IMMOBILIZER KNEE 20 (SOFTGOODS) ×3
IMMOBILIZER KNEE 20 THIGH 36 (SOFTGOODS) ×2 IMPLANT
INSERT ARTISURF PERS SZ 4-5 LT (Insert) ×2 IMPLANT
KIT TURNOVER KIT A (KITS) IMPLANT
NS IRRIG 1000ML POUR BTL (IV SOLUTION) ×4 IMPLANT
PACK TOTAL KNEE CUSTOM (KITS) ×4 IMPLANT
PAD COLD SHLDR WRAP-ON (PAD) ×4 IMPLANT
PADDING CAST COTTON 6X4 STRL (CAST SUPPLIES) ×6 IMPLANT
PIN DRILL HDLS TROCAR 75 4PK (PIN) IMPLANT
PROTECTOR NERVE ULNAR (MISCELLANEOUS) ×4 IMPLANT
SCREW FEMALE HEX FIX 25X2.5 (ORTHOPEDIC DISPOSABLE SUPPLIES) ×2 IMPLANT
SET HNDPC FAN SPRY TIP SCT (DISPOSABLE) ×2 IMPLANT
SET PAD KNEE POSITIONER (MISCELLANEOUS) ×4 IMPLANT
SPONGE T-LAP 18X18 ~~LOC~~+RFID (SPONGE) ×12 IMPLANT
STAPLER VISISTAT 35W (STAPLE) IMPLANT
STEM POLY PAT PLY 29M KNEE (Knees) ×2 IMPLANT
STEM TIBIA 5 DEG SZ C L KNEE (Knees) IMPLANT
STRIP CLOSURE SKIN 1/2X4 (GAUZE/BANDAGES/DRESSINGS) IMPLANT
SUT MNCRL AB 4-0 PS2 18 (SUTURE) IMPLANT
SUT VIC AB 0 CT1 27 (SUTURE) ×3
SUT VIC AB 0 CT1 27XBRD ANTBC (SUTURE) ×2 IMPLANT
SUT VIC AB 1 CT1 36 (SUTURE) ×8 IMPLANT
SUT VIC AB 2-0 CT1 27 (SUTURE) ×6
SUT VIC AB 2-0 CT1 TAPERPNT 27 (SUTURE) ×4 IMPLANT
TIBIA STEM 5 DEG SZ C L KNEE (Knees) ×3 IMPLANT
TRAY FOLEY MTR SLVR 16FR STAT (SET/KITS/TRAYS/PACK) ×2 IMPLANT
WATER STERILE IRR 1000ML POUR (IV SOLUTION) ×8 IMPLANT

## 2021-07-15 NOTE — Anesthesia Postprocedure Evaluation (Signed)
Anesthesia Post Note  Patient: Afsana Liera  Procedure(s) Performed: LEFT TOTAL KNEE ARTHROPLASTY (Left: Knee)     Patient location during evaluation: PACU Anesthesia Type: Spinal and Regional Level of consciousness: oriented and awake and alert Pain management: pain level controlled Vital Signs Assessment: post-procedure vital signs reviewed and stable Respiratory status: spontaneous breathing and respiratory function stable Cardiovascular status: blood pressure returned to baseline and stable Postop Assessment: no headache, no backache, no apparent nausea or vomiting, spinal receding and patient able to bend at knees Anesthetic complications: no   No notable events documented.  Last Vitals:  Vitals:   07/15/21 1245 07/15/21 1300  BP: 117/82 109/85  Pulse: 71 69  Resp: 19 16  Temp:  36.5 C  SpO2: 97% 96%    Last Pain:  Vitals:   07/15/21 1300  TempSrc:   PainSc: 0-No pain                 Maahi Lannan,W. EDMOND

## 2021-07-15 NOTE — Transfer of Care (Signed)
Immediate Anesthesia Transfer of Care Note  Patient: Denise Morales  Procedure(s) Performed: LEFT TOTAL KNEE ARTHROPLASTY (Left: Knee)  Patient Location: PACU  Anesthesia Type:Spinal  Level of Consciousness: awake, alert , oriented and patient cooperative  Airway & Oxygen Therapy: Patient Spontanous Breathing and Patient connected to face mask oxygen  Post-op Assessment: Report given to RN and Post -op Vital signs reviewed and stable  Post vital signs: Reviewed and stable  Last Vitals:  Vitals Value Taken Time  BP 101/88 07/15/21 1215  Temp 36.7 C 07/15/21 1215  Pulse 77 07/15/21 1216  Resp 14 07/15/21 1216  SpO2 100 % 07/15/21 1216  Vitals shown include unvalidated device data.  Last Pain:  Vitals:   07/15/21 0754  TempSrc:   PainSc: 0-No pain      Patients Stated Pain Goal: 4 (44/61/90 1222)  Complications: No notable events documented.

## 2021-07-15 NOTE — Anesthesia Preprocedure Evaluation (Addendum)
Anesthesia Evaluation  Patient identified by MRN, date of birth, ID band Patient awake    Reviewed: Allergy & Precautions, H&P , NPO status , Patient's Chart, lab work & pertinent test results  Airway Mallampati: II  TM Distance: >3 FB Neck ROM: Full    Dental no notable dental hx. (+) Teeth Intact, Dental Advisory Given   Pulmonary neg pulmonary ROS,    Pulmonary exam normal breath sounds clear to auscultation       Cardiovascular hypertension, Pt. on medications  Rhythm:Regular Rate:Normal     Neuro/Psych Anxiety Depression negative neurological ROS     GI/Hepatic Neg liver ROS, GERD  Medicated,  Endo/Other  Hypothyroidism Morbid obesity  Renal/GU negative Renal ROS  negative genitourinary   Musculoskeletal  (+) Arthritis , Osteoarthritis,  Fibromyalgia -  Abdominal   Peds  Hematology negative hematology ROS (+)   Anesthesia Other Findings   Reproductive/Obstetrics negative OB ROS                            Anesthesia Physical Anesthesia Plan  ASA: 3  Anesthesia Plan: Spinal   Post-op Pain Management: Regional block and Tylenol PO (pre-op)   Induction: Intravenous  PONV Risk Score and Plan: 3 and Propofol infusion, Midazolam and Ondansetron  Airway Management Planned: Natural Airway and Simple Face Mask  Additional Equipment:   Intra-op Plan:   Post-operative Plan:   Informed Consent: I have reviewed the patients History and Physical, chart, labs and discussed the procedure including the risks, benefits and alternatives for the proposed anesthesia with the patient or authorized representative who has indicated his/her understanding and acceptance.     Dental advisory given  Plan Discussed with: CRNA  Anesthesia Plan Comments:         Anesthesia Quick Evaluation

## 2021-07-15 NOTE — Progress Notes (Signed)
PT Cancellation Note  Patient Details Name: Lamerle Jabs MRN: [id number] DOB: 1960/12/19   Cancelled Treatment:     PT order received and eval attempted x 2 but deferred 2* elevated pain level.  Will follow in am.   Demika Langenderfer 07/15/2021, 6:01 PM

## 2021-07-15 NOTE — Interval H&P Note (Signed)
History and Physical Interval Note: The patient understands that she is here today for a left knee replacement to treat her left knee osteoarthritis.  There has been no acute or interval change in her medical status.  Please see recent H&P.  The risks and benefits of surgery been discussed in detail and informed consent is obtained.  The left operative knee has been marked.  07/15/2021 9:05 AM  Denise Morales  has presented today for surgery, with the diagnosis of osteoarthritis left knee.  The various methods of treatment have been discussed with the patient and family. After consideration of risks, benefits and other options for treatment, the patient has consented to  Procedure(s): LEFT TOTAL KNEE ARTHROPLASTY (Left) as a surgical intervention.  The patient's history has been reviewed, patient examined, no change in status, stable for surgery.  I have reviewed the patient's chart and labs.  Questions were answered to the patient's satisfaction.     Mcarthur Rossetti

## 2021-07-15 NOTE — Progress Notes (Signed)
Assisted Dr. Edmond Fitzgerald with left, ultrasound guided, adductor canal block. Side rails up, monitors on throughout procedure. See vital signs in flow sheet. Tolerated Procedure well. 

## 2021-07-15 NOTE — Plan of Care (Signed)
  Problem: Clinical Measurements: Goal: Ability to maintain clinical measurements within normal limits will improve Outcome: Progressing   Problem: Activity: Goal: Risk for activity intolerance will decrease Outcome: Progressing   Problem: Pain Managment: Goal: General experience of comfort will improve Outcome: Progressing   Problem: Safety: Goal: Ability to remain free from injury will improve Outcome: Progressing   

## 2021-07-15 NOTE — Brief Op Note (Signed)
07/15/2021  11:53 AM  PATIENT:  Denise Morales  61 y.o. female  PRE-OPERATIVE DIAGNOSIS:  osteoarthritis left knee  POST-OPERATIVE DIAGNOSIS:  osteoarthritis left knee  PROCEDURE:  Procedure(s): LEFT TOTAL KNEE ARTHROPLASTY (Left)  SURGEON:  Surgeon(s) and Role:    Mcarthur Rossetti, MD - Primary  PHYSICIAN ASSISTANT:  Benita Stabile, PA-C  ANESTHESIA:   regional and spinal  COUNTS:  YES  TOURNIQUET:   Total Tourniquet Time Documented: Thigh (Left) - 43 minutes Total: Thigh (Left) - 43 minutes   DICTATION: .Other Dictation: Dictation Number [id number]  PLAN OF CARE: Admit for overnight observation  PATIENT DISPOSITION:  PACU - hemodynamically stable.   Delay start of Pharmacological VTE agent (>24hrs) due to surgical blood loss or risk of bleeding: no

## 2021-07-15 NOTE — Anesthesia Procedure Notes (Signed)
Anesthesia Regional Block: Adductor canal block   Pre-Anesthetic Checklist: , timeout performed,  Correct Patient, Correct Site, Correct Laterality,  Correct Procedure, Correct Position, site marked,  Risks and benefits discussed,  Pre-op evaluation,  At surgeon's request and post-op pain management  Laterality: Left  Prep: Maximum Sterile Barrier Precautions used, chloraprep       Needles:  Injection technique: Single-shot  Needle Type: Echogenic Stimulator Needle     Needle Length: 9cm  Needle Gauge: 21     Additional Needles:   Procedures:,,,, ultrasound used (permanent image in chart),,    Narrative:  Start time: 07/15/2021 8:53 AM End time: 07/15/2021 9:03 AM Injection made incrementally with aspirations every 5 mL.  Performed by: Personally  Anesthesiologist: Roderic Palau, MD

## 2021-07-15 NOTE — Anesthesia Procedure Notes (Signed)
Spinal  Patient location during procedure: OR Start time: 07/15/2021 10:26 AM End time: 07/15/2021 10:30 AM Reason for block: surgical anesthesia Staffing Performed: anesthesiologist  Anesthesiologist: Roderic Palau, MD Preanesthetic Checklist Completed: patient identified, IV checked, risks and benefits discussed, surgical consent, monitors and equipment checked, pre-op evaluation and timeout performed Spinal Block Patient position: sitting Prep: DuraPrep Patient monitoring: cardiac monitor, continuous pulse ox and blood pressure Approach: midline Location: L3-4 Injection technique: single-shot Needle Needle type: Pencan  Needle gauge: 24 G Needle length: 9 cm Assessment Sensory level: T8 Events: CSF return and second provider Additional Notes Functioning IV was confirmed and monitors were applied. Sterile prep and drape, including hand hygiene and sterile gloves were used. The patient was positioned and the spine was prepped. The skin was anesthetized with lidocaine.  Free flow of clear CSF was obtained prior to injecting local anesthetic into the CSF.  The spinal needle aspirated freely following injection.  The needle was carefully withdrawn.  The patient tolerated the procedure well. Crna attempted at L2-3 prior to my attempt.

## 2021-07-16 ENCOUNTER — Other Ambulatory Visit (HOSPITAL_BASED_OUTPATIENT_CLINIC_OR_DEPARTMENT_OTHER): Payer: Self-pay | Admitting: Orthopaedic Surgery

## 2021-07-16 DIAGNOSIS — M1712 Unilateral primary osteoarthritis, left knee: Secondary | ICD-10-CM | POA: Diagnosis not present

## 2021-07-16 LAB — CBC
HCT: 33.8 % — ABNORMAL LOW (ref 36.0–46.0)
Hemoglobin: 10.5 g/dL — ABNORMAL LOW (ref 12.0–15.0)
MCH: 27.5 pg (ref 26.0–34.0)
MCHC: 31.1 g/dL (ref 30.0–36.0)
MCV: 88.5 fL (ref 80.0–100.0)
Platelets: 371 10*3/uL (ref 150–400)
RBC: 3.82 MIL/uL — ABNORMAL LOW (ref 3.87–5.11)
RDW: 15.1 % (ref 11.5–15.5)
WBC: 13.9 10*3/uL — ABNORMAL HIGH (ref 4.0–10.5)
nRBC: 0 % (ref 0.0–0.2)

## 2021-07-16 LAB — BASIC METABOLIC PANEL
Anion gap: 8 (ref 5–15)
BUN: 20 mg/dL (ref 8–23)
CO2: 25 mmol/L (ref 22–32)
Calcium: 8.6 mg/dL — ABNORMAL LOW (ref 8.9–10.3)
Chloride: 106 mmol/L (ref 98–111)
Creatinine, Ser: 0.94 mg/dL (ref 0.44–1.00)
GFR, Estimated: >60 mL/min (ref >60)
Glucose, Bld: 139 mg/dL — ABNORMAL HIGH (ref 70–99)
Potassium: 4.2 mmol/L (ref 3.5–5.1)
Sodium: 139 mmol/L (ref 135–145)

## 2021-07-16 MED ORDER — HYDROCODONE-ACETAMINOPHEN 5-325 MG PO TABS: 1.0000 | ORAL_TABLET | Freq: Four times a day (QID) | ORAL | 0 refills | Status: DC | PRN

## 2021-07-16 MED ORDER — ASPIRIN 81 MG PO CHEW: 81.0000 mg | CHEWABLE_TABLET | Freq: Two times a day (BID) | ORAL | 0 refills | Status: DC

## 2021-07-16 MED ORDER — OXYCODONE HCL 5 MG PO TABS
5.0000 mg | ORAL_TABLET | ORAL | 0 refills | Status: DC | PRN
Start: 2021-07-16 — End: 2021-10-17

## 2021-07-16 NOTE — Discharge Summary (Signed)
Patient ID: Denise Morales MRN: [id number] DOB/AGE: 11/29/1960 61 y.o.  Admit date: 07/15/2021 Discharge date: 07/16/2021  Admission Diagnoses:  Principal Problem:   Unilateral primary osteoarthritis, left knee Active Problems:   Status post total left knee replacement   Discharge Diagnoses:  Same  Past Medical History:  Diagnosis Date   Anxiety    Arthritis    Depression    Fibromyalgia    GERD (gastroesophageal reflux disease)    Hyperlipidemia    Hypertension    Hypothyroidism    Melanoma ([place])    face   Thyroid disease     Surgeries: Procedure(s): LEFT TOTAL KNEE ARTHROPLASTY on 07/15/2021   Consultants:   Discharged Condition: Improved  Hospital Course: Oral Hallgren is an 61 y.o. female who was admitted 07/15/2021 for operative treatment ofUnilateral primary osteoarthritis, left knee. Patient has severe unremitting pain that affects sleep, daily activities, and work/hobbies. After pre-op clearance the patient was taken to the operating room on 07/15/2021 and underwent  Procedure(s): LEFT TOTAL KNEE ARTHROPLASTY.    Patient was given perioperative antibiotics:  Anti-infectives (From admission, onward)    Start     Dose/Rate Route Frequency Ordered Stop   07/15/21 1600  ceFAZolin (ANCEF) IVPB 1 g/50 mL premix        1 g 100 mL/hr over 30 Minutes Intravenous Every 6 hours 07/15/21 1353 07/15/21 2133   07/15/21 0730  ceFAZolin (ANCEF) IVPB 2g/100 mL premix        2 g 200 mL/hr over 30 Minutes Intravenous On call to O.R. 07/15/21 0725 07/15/21 1031        Patient was given sequential compression devices, early ambulation, and chemoprophylaxis to prevent DVT.  Patient benefited maximally from hospital stay and there were no complications.    Recent vital signs: Patient Vitals for the past 24 hrs:  BP Temp Temp src Pulse Resp SpO2  07/16/21 0535 (!) 99/58 97.7 F (36.5 C) Oral 68 19 97 %  07/16/21 0128 106/65 97.9 F (36.6 C) Oral 71 16 96 %  07/15/21  2053 105/73 97.6 F (36.4 C) Oral 69 19 97 %  07/15/21 1609 121/62 97.7 F (36.5 C) Oral 74 20 96 %  07/15/21 1355 112/76 98 F (36.7 C) Oral 68 16 96 %  07/15/21 1300 109/85 97.7 F (36.5 C) -- 69 16 96 %  07/15/21 1245 117/82 -- -- 71 19 97 %  07/15/21 1230 102/76 -- -- 73 14 99 %  07/15/21 1215 101/88 98 F (36.7 C) -- 75 14 100 %     Recent laboratory studies:  Recent Labs    07/16/21 0309  WBC 13.9*  HGB 10.5*  HCT 33.8*  PLT 371  NA 139  K 4.2  CL 106  CO2 25  BUN 20  CREATININE 0.94  GLUCOSE 139*  CALCIUM 8.6*     Discharge Medications:   Allergies as of 07/16/2021   No Known Allergies      Medication List     TAKE these medications    acetaminophen 500 MG tablet Commonly known as: TYLENOL Take 1,000 mg by mouth every 6 (six) hours as needed for moderate pain or headache.   ACIDOPHILUS PO Take 1 capsule by mouth daily.   allopurinol 100 MG tablet Commonly known as: ZYLOPRIM Take 100 mg by mouth daily.   aspirin 81 MG chewable tablet Chew 1 tablet (81 mg total) by mouth 2 (two) times daily.   baclofen 10 MG tablet Commonly known as: LIORESAL Take  1 tablet (10 mg total) by mouth 3 (three) times daily as needed for muscle spasms. 1 (ONE) TABLET BY MOUTH THREE TIMES DAILY AS NEEDED FOR MUSCLE SPASMS What changed:  when to take this additional instructions   dicyclomine 10 MG capsule Commonly known as: BENTYL Take 10 mg by mouth every 6 (six) hours as needed for spasms.   diphenhydrAMINE 25 MG tablet Commonly known as: BENADRYL Take 25 mg by mouth at bedtime.   diphenhydrAMINE HCl (Sleep) 50 MG Caps Take 50 mg by mouth at bedtime.   escitalopram 20 MG tablet Commonly known as: Lexapro Take 1 tablet (20 mg total) by mouth daily.   hydrOXYzine 25 MG tablet Commonly known as: ATARAX TAKE 1 TABLET BY MOUTH AT BEDTIME AS NEEDED What changed:  when to take this reasons to take this   KRILL OIL PO Take 1 capsule by mouth daily.    levothyroxine 50 MCG tablet Commonly known as: SYNTHROID Take 50 mcg by mouth daily.   lisinopril-hydrochlorothiazide 10-12.5 MG tablet Commonly known as: ZESTORETIC Take 1 tablet by mouth daily.   nabumetone 500 MG tablet Commonly known as: RELAFEN Take 1 tablet (500 mg total) by mouth 2 (two) times daily as needed.   naproxen sodium 220 MG tablet Commonly known as: ALEVE Take 220 mg by mouth 2 (two) times daily.   omeprazole 40 MG capsule Commonly known as: PRILOSEC Take 1 capsule by mouth once daily   oxyCODONE 5 MG immediate release tablet Commonly known as: Oxy IR/ROXICODONE Take 1 tablet (5 mg total) by mouth every 4 (four) hours as needed for moderate pain (pain score 4-6). No more than 6 tablets daily   pravastatin 40 MG tablet Commonly known as: PRAVACHOL Take 1 tablet (40 mg total) by mouth daily.   rOPINIRole 0.25 MG tablet Commonly known as: Requip Take 1 tablet (0.25 mg total) by mouth at bedtime. Take 1 tablet at night for 2 nights and then increase to 2 tablets at night for 5 days.  After that can increase to 1/3 tablet at night if needed. What changed: additional instructions   traMADol 50 MG tablet Commonly known as: ULTRAM Take 2 tablets (100 mg total) by mouth every 6 (six) hours as needed.   Turmeric 500 MG Caps Take 500 mg by mouth daily.   VITAMIN K2-VITAMIN D3 PO Take 1 tablet by mouth daily.               Durable Medical Equipment  (From admission, onward)           Start     Ordered   07/15/21 1354  DME 3 n 1  Once        07/15/21 1353   07/15/21 1354  DME Walker rolling  Once       Question Answer Comment  Walker: With 5 Inch Wheels   Patient needs a walker to treat with the following condition Status post left knee replacement      07/15/21 1353            Diagnostic Studies: DG Knee Left Port  Result Date: 07/15/2021 CLINICAL DATA:  Status post left knee arthroplasty EXAM: PORTABLE LEFT KNEE - 1-2 VIEW  COMPARISON:  05/18/2021 FINDINGS: There is interval left knee arthroplasty. There are pockets of air in the soft tissues. Skin staples are seen. No fracture is seen. IMPRESSION: Status post left knee arthroplasty. Electronically Signed   By: Elmer Picker M.D.   On: 07/15/2021 12:48  Disposition: Discharge disposition: 01-Home or Self Care       Discharge Instructions     Discharge patient   Complete by: As directed    Discharge disposition: 01-Home or Self Care   Discharge patient date: 07/16/2021        Follow-up Information     Mcarthur Rossetti, MD Follow up in 2 week(s).   Specialty: Orthopedic Surgery Contact information: [address] [place] [address] [phone]                  Signed: Mcarthur Rossetti 07/16/2021, 11:00 AM

## 2021-07-16 NOTE — Plan of Care (Signed)
  Problem: Pain Managment: Goal: General experience of comfort will improve Outcome: Progressing   Problem: Activity: Goal: Risk for activity intolerance will decrease Outcome: Progressing   

## 2021-07-16 NOTE — Evaluation (Signed)
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  Pt should progress to dc home with family assist.     Recommendations for follow up therapy are one component of a multi-disciplinary discharge planning process, led by the attending physician.  Recommendations may be updated based on patient status, additional functional criteria and insurance authorization.  Follow Up Recommendations Follow physician's recommendations for discharge plan and follow up therapies    Assistance Recommended at Discharge Frequent or constant Supervision/Assistance  Patient can return home with the following  A little help with walking and/or transfers;A little help with bathing/dressing/bathroom;Assistance with cooking/housework;Help with stairs or ramp for entrance;Assist for transportation    Equipment Recommendations None recommended by PT  Recommendations for Other Services       Functional Status Assessment Patient has had a recent decline in their functional status and demonstrates the ability to make significant improvements in function in a reasonable and predictable amount of time.     Precautions / Restrictions Precautions Precautions: Fall;Knee Restrictions Weight Bearing Restrictions: No Other Position/Activity Restrictions: wbat      Mobility  Bed Mobility Overal bed mobility: Needs Assistance Bed Mobility: Supine to Sit     Supine to sit: Min assist, Min guard     General bed mobility comments: Increased time with min guard for L LE management    Transfers Overall transfer level: Needs assistance Equipment used: Rolling walker (2 wheels) Transfers: Sit to/from Stand Sit to Stand: Min assist, Min  guard           General transfer comment: cues for LE management and use of UEs to self assist    Ambulation/Gait Ambulation/Gait assistance: Min assist, Min guard Gait Distance (Feet): 75 Feet Assistive device: Rolling walker (2 wheels) Gait Pattern/deviations: Step-to pattern, Decreased step length - right, Decreased step length - left, Shuffle, Trunk flexed Gait velocity: decr     General Gait Details: cues for sequence, posture and position from [employer]            Wheelchair Mobility    Modified Rankin (Stroke Patients Only)       Balance Overall balance assessment: Needs assistance Sitting-balance support: No upper extremity supported, Feet supported Sitting balance-Leahy Scale: Good     Standing balance support: Bilateral upper extremity supported Standing balance-Leahy Scale: Poor                               Pertinent Vitals/Pain Pain Assessment Pain Assessment: 0-10 Pain Score: 4  Pain Location: L knee Pain Descriptors / Indicators: Aching, Sore Pain Intervention(s): Limited activity within patient's tolerance, Monitored during session, Premedicated before session, Ice applied    Home Living Family/patient expects to be discharged to:: Private residence Living Arrangements: Spouse/significant other Available Help at Discharge: Family Type of Home: House Home Access: Stairs to enter   Technical brewer of Steps: 2+ 1   Home Layout: One level Home Equipment: None      Prior Function Prior Level of Function : Independent/Modified Independent                     Hand Dominance   Dominant Hand:  Right    Extremity/Trunk Assessment   Upper Extremity Assessment Upper Extremity Assessment: Overall WFL for tasks assessed    Lower Extremity Assessment Lower Extremity Assessment: LLE deficits/detail LLE Deficits / Details: IND SLR with AAROM at knee -5 - 40    Cervical / Trunk Assessment Cervical / Trunk  Assessment: Normal  Communication   Communication: No difficulties  Cognition Arousal/Alertness: Awake/alert Behavior During Therapy: WFL for tasks assessed/performed Overall Cognitive Status: Within Functional Limits for tasks assessed                                          General Comments      Exercises Total Joint Exercises Ankle Circles/Pumps: AROM, Both, 15 reps, Supine Quad Sets: AROM, Both, 10 reps, Supine Heel Slides: AAROM, Left, 15 reps, Supine Hip ABduction/ADduction: AAROM, AROM, Left, 10 reps, Supine   Assessment/Plan    PT Assessment Patient needs continued PT services  PT Problem List Decreased strength;Decreased range of motion;Decreased activity tolerance;Decreased balance;Decreased mobility;Decreased knowledge of use of DME;Pain       PT Treatment Interventions DME instruction;Gait training;Stair training;Functional mobility training;Therapeutic activities;Therapeutic exercise;Balance training;Patient/family education    PT Goals (Current goals can be found in the Care Plan section)  Acute Rehab PT Goals Patient Stated Goal: Regain IND PT Goal Formulation: With patient Time For Goal Achievement: 07/23/21 Potential to Achieve Goals: Good    Frequency 7X/week     Co-evaluation               AM-PAC PT "6 Clicks" Mobility  Outcome Measure Help needed turning from your back to your side while in a flat bed without using bedrails?: A Little Help needed moving from lying on your back to sitting on the side of a flat bed without using bedrails?: A Little Help needed moving to and from a bed to a chair (including a wheelchair)?: A Little Help needed standing up from a chair using your arms (e.g., wheelchair or bedside chair)?: A Little Help needed to walk in hospital room?: A Little Help needed climbing 3-5 steps with a railing? : A Lot 6 Click Score: 17    End of Session Equipment Utilized During Treatment: Gait belt Activity  Tolerance: Patient tolerated treatment well Patient left: in chair;with call bell/phone within reach;with chair alarm set Nurse Communication: Mobility status PT Visit Diagnosis: Difficulty in walking, not elsewhere classified (R26.2);Pain Pain - Right/Left: Left Pain - part of body: Knee    Time: 0930-1005 PT Time Calculation (min) (ACUTE ONLY): 35 min   Charges:   PT Evaluation $PT Eval Low Complexity: 1 Low PT Treatments $Therapeutic Exercise: 8-22 mins        Debe Coder PT Acute Rehabilitation Services Pager [phone] Office [phone]   Kemauri Musa 07/16/2021, 2:29 PM

## 2021-07-16 NOTE — Discharge Instructions (Signed)

## 2021-07-16 NOTE — Op Note (Signed)
Denise Morales, KNEISLEY MEDICAL RECORD NO: [id number] ACCOUNT NO: [id number] DATE OF BIRTH: [birth year] FACILITY: [facility]: [id number] PHYSICIAN: Lind Guest. [name], MD  Operative Report   DATE OF PROCEDURE: 07/15/2021  PREOPERATIVE DIAGNOSIS:  Primary osteoarthritis and degenerative joint disease, left knee.  POSTOPERATIVE DIAGNOSIS:  Primary osteoarthritis and degenerative joint disease, left knee.  PROCEDURE:  Left total knee arthroplasty.  IMPLANTS:  Biomet/Zimmer performance knee system with size 5 CR left femur, size C left tibial tray, 14 mm medial congruent polyethylene insert fixed bearing, size 29 patellar button.  SURGEON:  Lind Guest. [name], MD  ASSISTANT:  Erskine Emery, PA-C  ANESTHESIA: 1.  Left lower extremity adductor canal block. 2.  Spinal.  TOURNIQUET TIME:  Less than 1 hour.  ANTIBIOTICS:  2 g IV Ancef.  ESTIMATED BLOOD LOSS:  Less than 100 mL.  COMPLICATIONS:  None.  INDICATIONS:  The patient is a 61 year old female who has a history of bilateral knee osteoarthritis.  We actually replaced her right knee successfully 6 months ago in July of 2022.  Her left knee also shows severe arthritis and at this point, her left  knee pain is detrimentally affecting her mobility, her quality of life and activities of daily living to the point she does wish to proceed with the knee replacement on the left side and given her success of the right knee replacement, we agree with this  as well and given her radiographic and clinical exam findings of her left knee.  Having had this before, she is fully aware of the risk of acute blood loss anemia, nerve or vessel injury, fracture, infection, DVT, implant failure, skin and soft tissue  issues.  She understands our goals are to decrease pain, improve mobility and overall improve quality of life.  DESCRIPTION OF PROCEDURE:  After informed consent was obtained, appropriate left knee was marked and adductor canal block was  obtained to the left lower extremity in the holding room.  She was then brought to the operating room and sat up on the  operating table.  Spinal anesthesia was obtained.  She was laid in supine position on the operating table.  Foley catheter was placed and a nonsterile tourniquet was placed around her upper left thigh.  Her left thigh, knee, leg, ankle and foot were  prepped and draped with DuraPrep and sterile drapes including a sterile stockinette.  A timeout was called and she was identified as correct patient, correct left knee.  We then used Esmarch to wrap that leg and tourniquet was inflated to 300 mm of  pressure.  We then made a direct midline incision over the patella and carried this proximally and distally.  We dissected down the knee joint, carried out a medial parapatellar arthrotomy and found significant cartilage loss in all three compartments.   We removed osteophytes from all three compartments as well as remnants of the ACL, medial and lateral meniscus.  With the knee in a flexed position using the extramedullary cutting guide, we set our slope for -3 degree implant, just a little bit more  than that correcting for varus and valgus and neutral slope and taking 9 mm off the high side.  We made this cut without difficulty.  We then used an intramedullary guide for our distal femoral cut, setting this for left knee at 5 degrees externally  rotated for a 10 mm distal femoral cut, we made this cut without difficulty as well and brought the knee back down to full extension and removed  more remnants of the meniscus from the back of the knee and then placed a 10 mm extension block, and she  actually hyperextended slightly.  Of note, her poly liner on her other side is 16 thickness.  We then went to the femur and put the femoral sizing guide based off the epicondylar axis.  Based off of this, we chose a size 5 standard femur.  We put a  4-in-1 cutting block for a size 5 femur, we made our  anterior and posterior cuts followed by our chamfer cuts.  We then chose a size C tibial tray with the knee in a flexed position for coverage of the tibial plateau setting the rotation off the femur  and the tibial tubercle.  We made our keel punch and drill hole off of this.  With the trial C left tibia, we trialled a size 5 left standard femur.  We then with a 10 mm medial congruent polyethylene insert went all the way up to a 14 mm insert.  The 14  mm insert gave her a good range of motion and stability, stressing varus and valgus and range of motion.  We then made our patellar cut and drilled three holes for size 29 patellar button. With all trial instrumentation in the knee, again I put it  through several cycles of motion.  We then removed all trial instrumentation from the knee and irrigated the knee with normal saline solution.  We mixed our cement and dried the knee real well.  With the knee in a flexed position, we then cemented our  Biomet Zimmer tibial tray size C left, followed by a size 5 left femur that we cemented.  We placed our 14 mm fixed bearing medial congruent polyethylene insert after removing cement debris from the knee.  We brought the knee back down to full extension.   We cemented our patellar button and held with a clamp for this and held the knee in a fully extended position to compress the cement.  Once it hardened, we let the tourniquet down.  Hemostasis was obtained with electrocautery.  We put the knee through  several more cycles of motion.  We were pleased with range of motion and stability.  We then closed the arthrotomy with interrupted #1 Vicryl suture followed by 0 Vicryl in the deep tissue and 2-0 Vicryl to close the subcutaneous tissue.  The skin was  closed with staples.  A well-padded sterile dressing was applied.  She was taken to recovery room in stable condition with all final counts being correct.  No complications noted.  Of note, Erskine Emery, PA-C did assist  in entire case and assistance was  crucial for facilitating all aspects of this case.   NIK D: 07/15/2021 11:51:55 am T: 07/16/2021 12:03:00 am  JOB: [id number]/ [id number]

## 2021-07-16 NOTE — TOC Transition Note (Addendum)
Transition of Care [facility]) - CM/SW Discharge Note   Patient Details  Name: Denise Morales MRN: [id number] Date of Birth: 1960/12/16  [facility] CM/SW Contact:  Ross Ludwig, LCSW Phone Number: 07/16/2021, 4:13 PM   Clinical Narrative:     CSW received referral that patient will need HH PT.  CSW contacted:   [facility] out of network [facility] Can not accept [name] Can not accept [facility] Unable to accept [facility] Unable to accept [facility] Can not accept [facility] Unable to accept [facility] Unable to accept  Unfortunately, none of the agencies are able to accept patient.  CSW updated patient's chart.   Final next level of care: Home/Self Care Barriers to Discharge: No [facility] will accept this patient   Patient Goals and CMS Choice Patient states their goals for this hospitalization and ongoing recovery are:: To return back home.      Discharge Placement                       Discharge Plan and Services                                     Social Determinants of Health (SDOH) Interventions     Readmission Risk Interventions No flowsheet data found.

## 2021-07-16 NOTE — Progress Notes (Signed)
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  This pm pt up to ambulate limited distance in hall, negotiated stairs, up to bathroom for toileting and hygiene at sink, and reviewed written HEP.  Pt eager for dc home this date.   Recommendations for follow up therapy are one component of a multi-disciplinary discharge planning process, led by the attending physician.  Recommendations may be updated based on patient status, additional functional criteria and insurance authorization.  Follow Up Recommendations  Follow physician's recommendations for discharge plan and follow up therapies     Assistance Recommended at Discharge Frequent or constant Supervision/Assistance  Patient can return home with the following A little help with walking and/or transfers;A little help with bathing/dressing/bathroom;Assistance with cooking/housework;Help with stairs or ramp for entrance;Assist for transportation   Equipment Recommendations  None recommended by PT    Recommendations for Other Services       Precautions / Restrictions Precautions Precautions: Fall;Knee Restrictions Weight Bearing Restrictions: No Other Position/Activity Restrictions: wbat     Mobility  Bed Mobility Overal bed mobility: Needs Assistance Bed Mobility: Sit to Supine     Supine to sit: Min assist, Min guard Sit to supine: Min guard   General bed mobility comments: Increased time with min guard for L LE management    Transfers Overall transfer level: Needs assistance Equipment used: Rolling walker (2 wheels) Transfers: Sit to/from Stand Sit to Stand: Min guard, Supervision           General transfer comment: cues for LE management and use of UEs to self assist     Ambulation/Gait Ambulation/Gait assistance: Min guard, Supervision Gait Distance (Feet): 50 Feet (and 15' back from bathroom) Assistive device: Rolling walker (2 wheels) Gait Pattern/deviations: Step-to pattern, Decreased step length - right, Decreased step length - left, Shuffle, Trunk flexed Gait velocity: decr     General Gait Details: cues for sequence, posture and position from RW   Stairs Stairs: Yes Stairs assistance: Min assist Stair Management: No rails, Step to pattern, Backwards, With walker Number of Stairs: 2 General stair comments: cues for sequence and foot/RW placement   Wheelchair Mobility    Modified Rankin (Stroke Patients Only)       Balance Overall balance assessment: Needs assistance Sitting-balance support: No upper extremity supported, Feet supported Sitting balance-Leahy Scale: Good     Standing balance support: No upper extremity supported Standing balance-Leahy Scale: Fair                              Cognition Arousal/Alertness: Awake/alert Behavior During Therapy: WFL for tasks assessed/performed Overall Cognitive Status: Within Functional Limits for tasks assessed                                          Exercises Total Joint Exercises Ankle Circles/Pumps: AROM, Both, 15 reps, Supine Quad Sets: AROM, Both, 10 reps, Supine Heel Slides: AAROM, Left, 15 reps, Supine Hip ABduction/ADduction: AAROM, AROM, Left, 10 reps, Supine    General Comments        Pertinent Vitals/Pain Pain Assessment Pain Assessment: 0-10 Pain Score:  4  Pain Location: L knee Pain Descriptors / Indicators: Aching, Sore Pain Intervention(s): Limited activity within patient's tolerance, Monitored during session, Premedicated before session, Ice applied    Home Living Family/patient expects to be discharged to:: Private residence Living Arrangements: Spouse/significant other Available Help at Discharge: Family Type of Home:  House Home Access: Stairs to enter   Technical brewer of Steps: 2+ 1   Home Layout: One level Home Equipment: None      Prior Function            PT Goals (current goals can now be found in the care plan section) Acute Rehab PT Goals Patient Stated Goal: Regain IND PT Goal Formulation: With patient Time For Goal Achievement: 07/23/21 Potential to Achieve Goals: Good Progress towards PT goals: Progressing toward goals    Frequency    7X/week      PT Plan Current plan remains appropriate    Co-evaluation              AM-PAC PT "6 Clicks" Mobility   Outcome Measure  Help needed turning from your back to your side while in a flat bed without using bedrails?: A Little Help needed moving from lying on your back to sitting on the side of a flat bed without using bedrails?: A Little Help needed moving to and from a bed to a chair (including a wheelchair)?: A Little Help needed standing up from a chair using your arms (e.g., wheelchair or bedside chair)?: A Little Help needed to walk in hospital room?: A Little Help needed climbing 3-5 steps with a railing? : A Little 6 Click Score: 18    End of Session Equipment Utilized During Treatment: Gait belt Activity Tolerance: Patient tolerated treatment well Patient left: in bed;with call bell/phone within reach;with family/visitor present Nurse Communication: Mobility status PT Visit Diagnosis: Difficulty in walking, not elsewhere classified (R26.2);Pain Pain - Right/Left: Left Pain - part of body: Knee     Time: 6294-7654 PT Time Calculation (min) (ACUTE ONLY): 41 min  Charges:  $Gait Training: 8-22 mins $Therapeutic Exercise: 8-22 mins $Therapeutic Activity: 8-22 mins                     Debe Coder PT Acute Rehabilitation Services Pager [phone] Office [phone]    Tanaka Gillen 07/16/2021, 3:01 PM

## 2021-07-16 NOTE — Progress Notes (Signed)
Discharge package printed and instructions given to patient. Verbalizes understanding.  

## 2021-07-16 NOTE — Progress Notes (Signed)
Subjective: 1 Day Post-Op Procedure(s) (LRB): LEFT TOTAL KNEE ARTHROPLASTY (Left) Patient reports pain as moderate.  Working well with therapy.  Slightly hypotensive, but asymptomatic.  Objective: Vital signs in last 24 hours: Temp:  [97.6 F (36.4 C)-98 F (36.7 C)] 97.7 F (36.5 C) (01/21 0535) Pulse Rate:  [68-75] 68 (01/21 0535) Resp:  [14-20] 19 (01/21 0535) BP: (99-121)/(58-88) 99/58 (01/21 0535) SpO2:  [96 %-100 %] 97 % (01/21 0535)  Intake/Output from previous day: 01/20 0701 - 01/21 0700 In: 2768.4 [P.O.:460; I.V.:2258.4; IV Piggyback:50] Out: 1020 [Urine:1000; Blood:20] Intake/Output this shift: No intake/output data recorded.  Recent Labs    07/16/21 0309  HGB 10.5*   Recent Labs    07/16/21 0309  WBC 13.9*  RBC 3.82*  HCT 33.8*  PLT 371   Recent Labs    07/16/21 0309  NA 139  K 4.2  CL 106  CO2 25  BUN 20  CREATININE 0.94  GLUCOSE 139*  CALCIUM 8.6*   No results for input(s): LABPT, INR in the last 72 hours.  Sensation intact distally Intact pulses distally Dorsiflexion/Plantar flexion intact Incision: scant drainage Compartment soft   Assessment/Plan: 1 Day Post-Op Procedure(s) (LRB): LEFT TOTAL KNEE ARTHROPLASTY (Left) Up with therapy Discharge home with home health      Mcarthur Rossetti 07/16/2021, 10:57 AM

## 2021-07-18 ENCOUNTER — Telehealth: Payer: Self-pay

## 2021-07-18 ENCOUNTER — Telehealth: Payer: Self-pay | Admitting: Orthopaedic Surgery

## 2021-07-18 NOTE — Telephone Encounter (Signed)
Pt called requesting to contact pharmacy for pre auth for oxycodone. Please send pre auth to pharmacy on file. Please call pt when send in. Pt phone number is [phone].

## 2021-07-18 NOTE — Telephone Encounter (Signed)
Transition Care Management Follow-up Telephone Call Date of discharge and from where: 07/16/2021-[facility]  How have you been since you were released from the hospital? Patient stated she is doing fine.  Any questions or concerns? No  Items Reviewed: Did the pt receive and understand the discharge instructions provided? Yes  Medications obtained and verified? Yes  Other? No  Any new allergies since your discharge? No  Dietary orders reviewed? No Do you have support at home? Yes   Home Care and Equipment/Supplies: Were home health services ordered? not applicable If so, what is the name of the agency? N/A  Has the agency set up a time to come to the patient's home? not applicable Were any new equipment or medical supplies ordered?  No What is the name of the medical supply agency? N/A Were you able to get the supplies/equipment? not applicable Do you have any questions related to the use of the equipment or supplies? No  Functional Questionnaire: (I = Independent and D = Dependent) ADLs: I  Bathing/Dressing- I  Meal Prep- I  Eating- I  Maintaining continence- I  Transferring/Ambulation- I  Managing Meds- I  Follow up appointments reviewed:  PCP Hospital f/u appt confirmed? No   Specialist Hospital f/u appt confirmed? Yes  Scheduled to see Ortho on 07/28/21 @ 1:45pm. Are transportation arrangements needed? No  If their condition worsens, is the pt aware to call PCP or go to the Emergency Dept.? Yes Was the patient provided with contact information for the PCP's office or ED? Yes Was to pt encouraged to call back with questions or concerns? Yes

## 2021-07-18 NOTE — Telephone Encounter (Signed)
PA sent to insurance.

## 2021-07-19 ENCOUNTER — Encounter (HOSPITAL_COMMUNITY): Payer: Self-pay | Admitting: Orthopaedic Surgery

## 2021-07-20 ENCOUNTER — Telehealth: Payer: Self-pay | Admitting: Orthopaedic Surgery

## 2021-07-20 ENCOUNTER — Other Ambulatory Visit: Payer: Self-pay

## 2021-07-20 DIAGNOSIS — Z96652 Presence of left artificial knee joint: Secondary | ICD-10-CM

## 2021-07-20 NOTE — Telephone Encounter (Signed)
Patient called asked when will HHPT come to her home? Patient said she got home from the hospital Saturday 07/16/2021. Patient said [facility] came to her home the last time she had surgery. The number to contact patient is [phone]

## 2021-07-20 NOTE — Telephone Encounter (Signed)
Patient aware we have sent an order for PT asap they will call and get her in since Medicaid won't cover her HHPT

## 2021-07-21 NOTE — Therapy (Addendum)
OUTPATIENT PHYSICAL THERAPY LOWER EXTREMITY EVALUATION   Patient Name: Denise Morales MRN: [id number] DOB:Oct 21, 1960, 61 y.o., female Today's Date: 07/21/2021    Past Medical History:  Diagnosis Date   Anxiety    Arthritis    Depression    Fibromyalgia    GERD (gastroesophageal reflux disease)    Hyperlipidemia    Hypertension    Hypothyroidism    Melanoma ([place])    face   Thyroid disease    Past Surgical History:  Procedure Laterality Date   ABDOMINAL HYSTERECTOMY  1985   CHOLECYSTECTOMY     COLONOSCOPY     SHOULDER ARTHROSCOPY WITH ROTATOR CUFF REPAIR AND SUBACROMIAL DECOMPRESSION Right 08/12/2020   Procedure: RIGHT SHOULDER ARTHROSCOPY WITH ROTATOR CUFF REPAIR AND SUBACROMIAL DECOMPRESSION;  Surgeon: Mcarthur Rossetti, MD;  Location: [facility];  Service: Orthopedics;  Laterality: Right;   TOTAL KNEE ARTHROPLASTY Right 12/24/2020   Procedure: RIGHT TOTAL KNEE ARTHROPLASTY;  Surgeon: Mcarthur Rossetti, MD;  Location: [facility];  Service: Orthopedics;  Laterality: Right;   TOTAL KNEE ARTHROPLASTY Left 07/15/2021   Procedure: LEFT TOTAL KNEE ARTHROPLASTY;  Surgeon: Mcarthur Rossetti, MD;  Location: [facility];  Service: Orthopedics;  Laterality: Left;   Patient Active Problem List   Diagnosis Date Noted   Status post total left knee replacement 07/15/2021   Neoplasm of uncertain behavior of skin 05/25/2021   Generalized osteoarthritis of multiple sites 05/25/2021   Fibromyalgia syndrome 05/25/2021   Restless legs 03/16/2021   Hyperlipidemia 03/16/2021   Anxiety 03/16/2021   Status post right knee replacement 12/24/2020   Unilateral primary osteoarthritis, left knee 11/11/2020   Unilateral primary osteoarthritis, right knee 11/11/2020   Complete tear of right rotator cuff 08/12/2020   Hypertensive disorder 05/05/2019    PCP: Everardo Beals, NP  REFERRING PROVIDER: Mcarthur Rossetti*  REFERRING DIAG: J94.174 (ICD-10-CM) - Status  post left knee replacement  THERAPY DIAG:  No diagnosis found.  ONSET DATE: 07/15/2021  SUBJECTIVE:   SUBJECTIVE STATEMENT: Pt reports primary c/o knee pain and stiffness 7 days s/p Lt TKA on 07/15/2021 with Dr. Jean Rosenthal. She was already prescibed SLR, heel slides, supine hip abduction, ankle pumps, and early walking program to perform daily following her surgery. Current pain is 5-6/10, worst is 5-6/10, best is 0/10. Aggravating factors include knee movement. Easing factors include pain medication, rest, and ice. She reports that her swelling has been more severe than her Rt TKA performed last year. Red flag screening negative. Pt denies N/T. Pt walks using a FWW, but uses furniture to walk at home.  PERTINENT HISTORY: Lt TKA on 07/15/2021 with Dr. Jean Rosenthal  PAIN:  Are you having pain? Yes NPRS scale: 5-6/10 Pain location: Lt knee PAIN TYPE: aching, burning Pain description: intermittent  Aggravating factors: knee movement, prolonged sitting > 15 minutes, prolonged standing >3-4 minutes, prolonged walking >3-4 minutes Relieving factors: Pain meds, rest, ice  PRECAUTIONS: Knee and Fall  WEIGHT BEARING RESTRICTIONS Yes WBAT  FALLS:  Has patient fallen in last 6 months? No, Number of falls: 0  LIVING ENVIRONMENT: Lives with: lives with their family Lives in: House/apartment Stairs: Yes; External: 2 steps; on right going up Has following equipment at home: Quad cane small base, Environmental consultant - 2 wheeled, Manufacturing engineer  OCCUPATION: Retired  PLOF: [place], housework   OBJECTIVE:    COGNITION:  Overall cognitive status: Within functional limits for tasks assessed     SENSATION:  Light touch: Appears intact  POSTURE:  Forward posture in FWW, decreased Lt knee extension in stance and terminal swing  OBSERVATION: Circumferential measure at superior pole of patella: Rt: 45cm, Lt: 50cm Post-op bandage in-tact, surgical  incision not visible  LE AROM/PROM:  A/PROM Right 07/21/2021 Left 07/21/2021  Knee flexion 115/120p! 56p!  Knee extension 3/5 -9p!   (Blank rows = not tested)  LE MMT:  MMT Right 07/21/2021 Left 07/21/2021  Hip flexion 4/5 3+/5 p!  Hip extension    Hip abduction 4/5 2+/5  Knee flexion 5/5 2+/5p!  Knee extension 5/5 2+/5p!   (Blank rows = not tested)   FUNCTIONAL TESTS:  5 times sit to stand: 14 seconds  GAIT: Distance walked: 20 ft Assistive device utilized: Walker - 2 wheeled Level of assistance: Modified independence Comments: Pt ambulates with slow gait speed and antalgic gait. Decreased Lt knee extension in stance and terminal swing.    TODAY'S TREATMENT: [facility] Adult PT Treatment:                                                DATE: 07/22/2021 Therapeutic Exercise: Supine SLR with abduction x10 on Lt Supine Lt knee flexion AAROM with sheet x10 Seated Lt patellar self-mobilizations medial/ lateral x10 each Manual Therapy: N/A Neuromuscular re-ed: N/A Therapeutic Activity: N/A Modalities: N/A Self Care: N/A    PATIENT EDUCATION:  Education details: Pt educated on prognosis, POC, and HEP Person educated: Patient and Spouse Education method: Explanation, Media planner, and Handouts Education comprehension: verbalized understanding and returned demonstration   HOME EXERCISE PROGRAM: Access Code: [email or web address] URL: [email or web address] Date: 07/22/2021 Prepared by: Vanessa [place]  Exercises Heel slide with strap, LAYING ON BACK - 2 x daily - 7 x weekly - 3 sets - 10 reps - 5-sec hold Long Sitting 4 Way Patellar Glide - 2 x daily - 7 x weekly - 3 sets - 10 reps Straight leg raise while pulling leg away from body (abduction) - 2 x daily - 7 x weekly - 3 sets - 10 reps   ASSESSMENT:  CLINICAL IMPRESSION: Patient is a 61 y.o. F who was seen today for physical therapy evaluation and treatment for Lt knee pain/ stiffness following Lt TKA on  07/15/2020. Objective impairments include Abnormal gait, decreased balance, decreased mobility, difficulty walking, decreased ROM, decreased strength, increased edema, and pain. These impairments are limiting patient from cleaning, laundry, and yard work. Personal factors including 3+ comorbidities: See medical hx  are also affecting patient's functional outcome. Patient will benefit from skilled PT to address above impairments and improve overall function.  REHAB POTENTIAL: Excellent  CLINICAL DECISION MAKING: Stable/uncomplicated  EVALUATION COMPLEXITY: Low   GOALS: Goals reviewed with patient? Yes  SHORT TERM GOALS:  STG Name Target Date Goal status  1 Pt will report understanding and adherence to her HEP in order to promote independence in the management of her primary impairments. Baseline: HEP provided at eval. 08/19/2021 INITIAL  2 Pt will achieve Lt knee flexion AROM of 100 degrees in order to promote WNL gait. Baseline: 56 degrees 08/19/2021 INITIAL   LONG TERM GOALS:   LTG Name Target Date Goal status  1 Pt will achieve Lt knee flexion AROM of 120 degrees in order to put on her shoes and socks with less limitation. Baseline: 56 degrees 09/16/2021 INITIAL  2 Pt will report ability to walk >30 minutes without an  AD and with 0-2/10 pain in order to grocery shop with less limitation. Baseline: >5/10 pain after 3-4 minutes of walking 09/16/2021 INITIAL  3 Pt will achieve Lt knee flexion and extension MMT of 4+/5 in order to perform squats when doing yard work with less limitation. Baseline: 2+/5 09/16/2021 INITIAL  4 Pt will report ability to sit >30 minutes with 0-2/10 pain in order to ride or drive in her car with less limitation. Baseline: Unable to sit >15 minutes without >5/10 pain 09/16/2021 INITIAL   PLAN: PT FREQUENCY: 2x/week  PT DURATION: 8 weeks  PLANNED INTERVENTIONS: Therapeutic exercises, Therapeutic activity, Neuro Muscular re-education, Balance training, Gait  training, Patient/Family education, Joint mobilization, Stair training, Dry Needling, Cryotherapy, Manual lymph drainage, scar mobilization, Taping, and Manual therapy  PLAN FOR NEXT SESSION: Progress knee flexion AROM, early quad/ hip strengthening   Vanessa [place], PT, DPT 07/21/21 6:27 PM  Check all possible CPT codes: 32919- Therapeutic Exercise, [phone]- Neuro Re-education, [phone] - Gait Training, [phone] - Manual Therapy, [phone] - Therapeutic Activities, and [place]        Vanessa [place], PT, DPT 12/01/21 1:21 PM

## 2021-07-22 ENCOUNTER — Other Ambulatory Visit: Payer: Self-pay

## 2021-07-22 ENCOUNTER — Ambulatory Visit: Payer: Medicaid Other | Attending: Orthopaedic Surgery

## 2021-07-22 DIAGNOSIS — G8929 Other chronic pain: Secondary | ICD-10-CM | POA: Diagnosis not present

## 2021-07-22 DIAGNOSIS — M25562 Pain in left knee: Secondary | ICD-10-CM | POA: Insufficient documentation

## 2021-07-22 DIAGNOSIS — R262 Difficulty in walking, not elsewhere classified: Secondary | ICD-10-CM | POA: Diagnosis not present

## 2021-07-22 DIAGNOSIS — M6281 Muscle weakness (generalized): Secondary | ICD-10-CM | POA: Diagnosis not present

## 2021-07-22 DIAGNOSIS — Z96652 Presence of left artificial knee joint: Secondary | ICD-10-CM | POA: Insufficient documentation

## 2021-07-25 ENCOUNTER — Telehealth: Payer: Self-pay | Admitting: Orthopaedic Surgery

## 2021-07-25 NOTE — Telephone Encounter (Signed)
Called pt and advised. She stated understanding  

## 2021-07-25 NOTE — Telephone Encounter (Signed)
Please advise 

## 2021-07-25 NOTE — Telephone Encounter (Signed)
Pt called. States that her blood pressure is starting to go up. She would like to know if blackman wants her to start her blood pressure medicine back?   CB [phone]

## 2021-07-26 NOTE — Therapy (Signed)
OUTPATIENT PHYSICAL THERAPY TREATMENT NOTE   Patient Name: Denise Morales MRN: [id number] DOB:07-22-1960, 61 y.o., female Today's Date: 07/27/2021  PCP: Everardo Beals, NP REFERRING PROVIDER: Mcarthur Rossetti*   PT End of Session - 07/27/21 1040     Visit Number 2    Number of Visits 17    Date for PT Re-Evaluation 09/16/21    Authorization Type [employer]    Authorization Time Period Pending auth    PT Start Time 1043    PT Stop Time 1136   10 minutes cryotherapy   PT Time Calculation (min) 53 min    Equipment Utilized During Treatment Gait belt    Activity Tolerance Patient tolerated treatment well;Patient limited by pain    Behavior During Therapy WFL for tasks assessed/performed             Past Medical History:  Diagnosis Date   Anxiety    Arthritis    Depression    Fibromyalgia    GERD (gastroesophageal reflux disease)    Hyperlipidemia    Hypertension    Hypothyroidism    Melanoma ([place])    face   Thyroid disease    Past Surgical History:  Procedure Laterality Date   ABDOMINAL HYSTERECTOMY  1985   CHOLECYSTECTOMY     COLONOSCOPY     SHOULDER ARTHROSCOPY WITH ROTATOR CUFF REPAIR AND SUBACROMIAL DECOMPRESSION Right 08/12/2020   Procedure: RIGHT SHOULDER ARTHROSCOPY WITH ROTATOR CUFF REPAIR AND SUBACROMIAL DECOMPRESSION;  Surgeon: Mcarthur Rossetti, MD;  Location: [facility];  Service: Orthopedics;  Laterality: Right;   TOTAL KNEE ARTHROPLASTY Right 12/24/2020   Procedure: RIGHT TOTAL KNEE ARTHROPLASTY;  Surgeon: Mcarthur Rossetti, MD;  Location: [facility];  Service: Orthopedics;  Laterality: Right;   TOTAL KNEE ARTHROPLASTY Left 07/15/2021   Procedure: LEFT TOTAL KNEE ARTHROPLASTY;  Surgeon: Mcarthur Rossetti, MD;  Location: [facility];  Service: Orthopedics;  Laterality: Left;   Patient Active Problem List   Diagnosis Date Noted   Status post total left knee replacement 07/15/2021   Neoplasm of uncertain  behavior of skin 05/25/2021   Generalized osteoarthritis of multiple sites 05/25/2021   Fibromyalgia syndrome 05/25/2021   Restless legs 03/16/2021   Hyperlipidemia 03/16/2021   Anxiety 03/16/2021   Status post right knee replacement 12/24/2020   Unilateral primary osteoarthritis, left knee 11/11/2020   Unilateral primary osteoarthritis, right knee 11/11/2020   Complete tear of right rotator cuff 08/12/2020   Hypertensive disorder 05/05/2019    REFERRING DIAG: M07.680 (ICD-10-CM) - Status post left knee replacement  THERAPY DIAG:  Chronic pain of left knee  Muscle weakness (generalized)  Difficulty in walking, not elsewhere classified  PERTINENT HISTORY: Lt TKA on 07/15/2021 with Dr. Jean Rosenthal  PRECAUTIONS: Knee and Fall  SUBJECTIVE: Pt reports that she has continued to have swelling and upper thigh pain/ posterior knee pain over the past week. She adds that she sees her orthopedic surgeon tomorrow and plans to have her sutures removed at that point. She reports that she has been able to do her HEP twice daily.  PAIN:  Are you having pain? Yes NPRS scale: 3/10 Pain location: Lt upper thigh PAIN TYPE: aching Pain description: intermittent      OBJECTIVE:   *Unless otherwise noted, objective measures collected previously*   COGNITION:          Overall cognitive status: Within functional limits for tasks assessed  SENSATION:          Light touch: Appears intact     POSTURE:  Forward posture in FWW, decreased Lt knee extension in stance and terminal swing   OBSERVATION: Circumferential measure at superior pole of patella: Rt: 45cm, Lt: 50cm Post-op bandage in-tact, surgical incision not visible   LE AROM/PROM:   A/PROM Right 07/21/2021 Left 07/21/2021 Left 07/27/2021  Knee flexion 115/120p! 56p! 88p!  Knee extension 3/5 -9p! -9p!   (Blank rows = not tested)   LE MMT:   MMT Right 07/21/2021 Left 07/21/2021  Hip flexion 4/5  3+/5 p!  Hip extension      Hip abduction 4/5 2+/5  Knee flexion 5/5 2+/5p!  Knee extension 5/5 2+/5p!   (Blank rows = not tested)     FUNCTIONAL TESTS:  5 times sit to stand: 14 seconds   GAIT: Distance walked: 20 ft Assistive device utilized: Walker - 2 wheeled Level of assistance: Modified independence Comments: Pt ambulates with slow gait speed and antalgic gait. Decreased Lt knee extension in stance and terminal swing.       TODAY'S TREATMENT:  [facility] Adult PT Treatment:                                                DATE: 07/27/2021 Therapeutic Exercise: Seated active hamstring stretch 2x1mn on Lt Step up on 4-inch step 3x10 Supine Lt knee flexion AAROM x10 with 5-sec hold Supine SLR to 6" and 12"; 2x10 on Lt Manual Therapy: Supine Lt knee flexion/ extension contract/ co-contract MET x5 with 5-sec hold Supine grade 3 patellar mobilization on Lt in all planes x 5 minutes Neuromuscular re-ed: N/A Therapeutic Activity: N/A Modalities: Cryotherapy to Lt knee with knee elevated in supine x10 minutes Self Care: N/A   [facility]                                                [facility] Therapeutic Exercise: Supine SLR with abduction x10 on Lt Supine Lt knee flexion AAROM with sheet x10 Seated Lt patellar self-mobilizations medial/ lateral x10 each Manual Therapy: N/A Neuromuscular re-ed: N/A Therapeutic Activity: N/A Modalities: N/A Self Care: N/A       PATIENT EDUCATION:  Education details: Pt educated on prognosis, POC, and HEP Person educated: Patient and Spouse Education method: Explanation, DMedia planner and Handouts Education comprehension: verbalized understanding and returned demonstration     HOME EXERCISE PROGRAM: Access Code: [email or web address] URL: [email or web address] Date: 07/22/2021 Prepared by: TVanessa [place]  Exercises Heel slide with strap, LAYING ON BACK - 2 x daily - 7 x weekly - 3 sets - 10 reps -  5-sec hold Long Sitting 4 Way Patellar Glide - 2 x daily - 7 x weekly - 3 sets - 10 reps Straight leg raise while pulling leg away from body (abduction) - 2 x daily - 7 x weekly - 3 sets - 10 reps  Added 07/27/2021: Seated Hamstring Stretch - 2 x daily - 7 x weekly - 2 sets - 1-min hold Step Up - 2 x daily - 7 x weekly - 3 sets - 10 reps     ASSESSMENT:   CLINICAL IMPRESSION: Upon reassessment, the pt has made excellent  progress in Lt knee flexion AROM from 56 degrees to 88 degrees, which was further improved with knee flexion/ extension contract/ co-contract MET. She maintained a 9 degree limitation in knee extension AROM, although this was addressed today with newly added active hamstring stretch. She responded well to all interventions today and reports a therapeutic response to cryotherapy to end the session. She will continue to benefit from skilled PT to address her primary impairments and return to her prior level of function with less limitation.   REHAB POTENTIAL: Excellent   CLINICAL DECISION MAKING: Stable/uncomplicated   EVALUATION COMPLEXITY: Low     GOALS: Goals reviewed with patient? Yes   SHORT TERM GOALS:   STG Name Target Date Goal status  1 Pt will report understanding and adherence to her HEP in order to promote independence in the management of her primary impairments. Baseline: HEP provided at eval. 08/19/2021 INITIAL  2 Pt will achieve Lt knee flexion AROM of 100 degrees in order to promote WNL gait. Baseline: 56 degrees 08/19/2021 INITIAL    LONG TERM GOALS:    LTG Name Target Date Goal status  1 Pt will achieve Lt knee flexion AROM of 120 degrees in order to put on her shoes and socks with less limitation. Baseline: 56 degrees 09/16/2021 INITIAL  2 Pt will report ability to walk >30 minutes without an AD and with 0-2/10 pain in order to grocery shop with less limitation. Baseline: >5/10 pain after 3-4 minutes of walking 09/16/2021 INITIAL  3 Pt will achieve  Lt knee flexion and extension MMT of 4+/5 in order to perform squats when doing yard work with less limitation. Baseline: 2+/5 09/16/2021 INITIAL  4 Pt will report ability to sit >30 minutes with 0-2/10 pain in order to ride or drive in her car with less limitation. Baseline: Unable to sit >15 minutes without >5/10 pain 09/16/2021 INITIAL    PLAN: PT FREQUENCY: 2x/week   PT DURATION: 8 weeks   PLANNED INTERVENTIONS: Therapeutic exercises, Therapeutic activity, Neuro Muscular re-education, Balance training, Gait training, Patient/Family education, Joint mobilization, Stair training, Dry Needling, Cryotherapy, Manual lymph drainage, scar mobilization, Taping, and Manual therapy   PLAN FOR NEXT SESSION: Progress knee flexion AROM, early quad/ hip strengthening    Vanessa [place], PT, DPT 07/27/21 11:35 AM

## 2021-07-27 ENCOUNTER — Ambulatory Visit: Payer: Medicaid Other | Attending: Orthopaedic Surgery

## 2021-07-27 ENCOUNTER — Other Ambulatory Visit: Payer: Self-pay

## 2021-07-27 DIAGNOSIS — T50905A Adverse effect of unspecified drugs, medicaments and biological substances, initial encounter: Secondary | ICD-10-CM | POA: Diagnosis not present

## 2021-07-27 DIAGNOSIS — M25661 Stiffness of right knee, not elsewhere classified: Secondary | ICD-10-CM | POA: Diagnosis not present

## 2021-07-27 DIAGNOSIS — G8929 Other chronic pain: Secondary | ICD-10-CM | POA: Insufficient documentation

## 2021-07-27 DIAGNOSIS — M25562 Pain in left knee: Secondary | ICD-10-CM | POA: Insufficient documentation

## 2021-07-27 DIAGNOSIS — G4700 Insomnia, unspecified: Secondary | ICD-10-CM | POA: Diagnosis not present

## 2021-07-27 DIAGNOSIS — F419 Anxiety disorder, unspecified: Secondary | ICD-10-CM | POA: Diagnosis not present

## 2021-07-27 DIAGNOSIS — M6281 Muscle weakness (generalized): Secondary | ICD-10-CM | POA: Diagnosis not present

## 2021-07-27 DIAGNOSIS — R262 Difficulty in walking, not elsewhere classified: Secondary | ICD-10-CM | POA: Diagnosis not present

## 2021-07-27 DIAGNOSIS — E0590 Thyrotoxicosis, unspecified without thyrotoxic crisis or storm: Secondary | ICD-10-CM | POA: Diagnosis not present

## 2021-07-28 ENCOUNTER — Telehealth: Payer: Self-pay

## 2021-07-28 ENCOUNTER — Ambulatory Visit (INDEPENDENT_AMBULATORY_CARE_PROVIDER_SITE_OTHER): Payer: Medicaid Other | Admitting: Orthopaedic Surgery

## 2021-07-28 ENCOUNTER — Ambulatory Visit (HOSPITAL_COMMUNITY)
Admission: RE | Admit: 2021-07-28 | Discharge: 2021-07-28 | Disposition: A | Discharge status: Home / Self Care | Payer: Medicaid Other | Source: Ambulatory Visit | Attending: Orthopaedic Surgery | Admitting: Orthopaedic Surgery

## 2021-07-28 DIAGNOSIS — Z96652 Presence of left artificial knee joint: Secondary | ICD-10-CM | POA: Diagnosis not present

## 2021-07-28 MED ORDER — TRAMADOL HCL 50 MG PO TABS: 100.0000 mg | ORAL_TABLET | Freq: Four times a day (QID) | ORAL | 0 refills | Status: DC | PRN

## 2021-07-28 NOTE — Progress Notes (Signed)
VASCULAR LAB    Left lower extremity venous duplex has been performed.  See CV proc for preliminary results.  Called report to Waunita Schooner, [facility], RVT 07/28/2021, 4:26 PM

## 2021-07-28 NOTE — Addendum Note (Signed)
Addended by: Robyne Peers on: 07/28/2021 02:13 PM   Modules accepted: Orders

## 2021-07-28 NOTE — Telephone Encounter (Signed)
FYI Vascular Lab called, patient is Neg for DVT.   CB [phone]

## 2021-07-28 NOTE — Progress Notes (Signed)
The patient comes in today at 2 weeks status post a left total knee arthroplasty.  She reports excellent progress even with nursing home therapy.  She is started outpatient therapy as well.  She has been on a baby aspirin twice a day.  She is only needing tramadol for pain and wants this called in.  Her left knee incision looks good.  There is no swelling in the knee itself.  The staples were removed and Steri-Strips applied.  She already has full extension to past 90 degrees flexion.  Her calf though is very tender to touch.  Her foot and ankle not swollen but her calf is very tender which is concerning for the possibility of a DVT.  I would like to send her for Doppler ultrasound to rule out DVT.  We will call her with the results of that study.  From my standpoint I will need to see her back for 4 weeks in terms of just looking at her knee in general.  No x-rays are needed.  I did send in some tramadol.

## 2021-08-01 NOTE — Therapy (Signed)
OUTPATIENT PHYSICAL THERAPY TREATMENT NOTE   Patient Name: Denise Morales MRN: [id number] DOB:1961-02-17, 61 y.o., female Today's Date: 08/02/2021  PCP: Everardo Beals, NP REFERRING PROVIDER: Everardo Beals, NP   PT End of Session - 08/02/21 1147     Visit Number 3    Number of Visits 17    Date for PT Re-Evaluation 09/16/21    Authorization Type [place] [employer]    Authorization Time Period Pending auth    PT Start Time 1147    PT Stop Time 1228    PT Time Calculation (min) 41 min    Activity Tolerance Patient tolerated treatment well    Behavior During Therapy WFL for tasks assessed/performed              Past Medical History:  Diagnosis Date   Anxiety    Arthritis    Depression    Fibromyalgia    GERD (gastroesophageal reflux disease)    Hyperlipidemia    Hypertension    Hypothyroidism    Melanoma ([place])    face   Thyroid disease    Past Surgical History:  Procedure Laterality Date   ABDOMINAL HYSTERECTOMY  1985   CHOLECYSTECTOMY     COLONOSCOPY     SHOULDER ARTHROSCOPY WITH ROTATOR CUFF REPAIR AND SUBACROMIAL DECOMPRESSION Right 08/12/2020   Procedure: RIGHT SHOULDER ARTHROSCOPY WITH ROTATOR CUFF REPAIR AND SUBACROMIAL DECOMPRESSION;  Surgeon: Mcarthur Rossetti, MD;  Location: [place];  Service: Orthopedics;  Laterality: Right;   TOTAL KNEE ARTHROPLASTY Right 12/24/2020   Procedure: RIGHT TOTAL KNEE ARTHROPLASTY;  Surgeon: Mcarthur Rossetti, MD;  Location: [facility];  Service: Orthopedics;  Laterality: Right;   TOTAL KNEE ARTHROPLASTY Left 07/15/2021   Procedure: LEFT TOTAL KNEE ARTHROPLASTY;  Surgeon: Mcarthur Rossetti, MD;  Location: [facility];  Service: Orthopedics;  Laterality: Left;   Patient Active Problem List   Diagnosis Date Noted   Status post total left knee replacement 07/15/2021   Neoplasm of uncertain behavior of skin 05/25/2021   Generalized osteoarthritis of multiple sites 05/25/2021   Fibromyalgia  syndrome 05/25/2021   Restless legs 03/16/2021   Hyperlipidemia 03/16/2021   Anxiety 03/16/2021   Status post right knee replacement 12/24/2020   Unilateral primary osteoarthritis, left knee 11/11/2020   Unilateral primary osteoarthritis, right knee 11/11/2020   Complete tear of right rotator cuff 08/12/2020   Hypertensive disorder 05/05/2019    REFERRING DIAG: O27.035 (ICD-10-CM) - Status post left knee replacement  THERAPY DIAG:  Chronic pain of left knee  Muscle weakness (generalized)  Difficulty in walking, not elsewhere classified  PERTINENT HISTORY: Lt TKA on 07/15/2021 with Dr. Jean Rosenthal  PRECAUTIONS: Knee and Fall  SUBJECTIVE:  Patient had f/u with Dr. [name] last week and had sutures removed and was sent for an ultrasound due to concerns for DVT, but this was negative. She is having an ache that travels from the lateral thigh to the calf.  PAIN:  Are you having pain? Yes NPRS scale: 5/10 Pain location: left lateral thigh to calf  PAIN TYPE: aching Pain description: intermittent      OBJECTIVE:   *Unless otherwise noted, objective measures collected previously*   COGNITION:          Overall cognitive status: Within functional limits for tasks assessed                        SENSATION:          Light touch: Appears intact  POSTURE:  Forward posture in FWW, decreased Lt knee extension in stance and terminal swing   OBSERVATION: Circumferential measure at superior pole of patella: Rt: 45cm, Lt: 50cm Post-op bandage in-tact, surgical incision not visible  08/02/21: incision site healing well with no signs of infection   LE AROM/PROM:   A/PROM Right 07/21/2021 Left 07/21/2021 Left 07/27/2021 Left 08/02/21  Knee flexion 115/120p! 56p! 88p! 99  Knee extension 3/5 -9p! -9p!    (Blank rows = not tested)   LE MMT:   MMT Right 07/21/2021 Left 07/21/2021  Hip flexion 4/5 3+/5 p!  Hip extension      Hip abduction 4/5 2+/5  Knee flexion 5/5  2+/5p!  Knee extension 5/5 2+/5p!   (Blank rows = not tested)     FUNCTIONAL TESTS:  5 times sit to stand: 14 seconds   GAIT: Distance walked: 20 ft Assistive device utilized: Walker - 2 wheeled Level of assistance: Modified independence Comments: Pt ambulates with slow gait speed and antalgic gait. Decreased Lt knee extension in stance and terminal swing.       TODAY'S TREATMENT: [facility] Adult PT Treatment:                                                DATE: 08/02/21 Therapeutic Exercise: Heel slides 1 x 10 LLE SAQ 2 x 10 LLE Resisted hip abduction in hooklying 2 x 10 blue band  Sit to stand from raised height 2 x 10  Standing calf raise 2 x 10  Lateral weight shifts x 20  Forward weight shifts x 10 each  Seated knee extension AAROM Lt 1 x 10  Manual Therapy: Patellar mobilizations all planes grade II-III Lt knee PROM flexion and extension to tolerance   [facility] Adult PT Treatment:                                                DATE: 07/27/2021 Therapeutic Exercise: Seated active hamstring stretch 2x89min on Lt Step up on 4-inch step 3x10 Supine Lt knee flexion AAROM x10 with 5-sec hold Supine SLR to 6" and 12"; 2x10 on Lt Manual Therapy: Supine Lt knee flexion/ extension contract/ co-contract MET x5 with 5-sec hold Supine grade 3 patellar mobilization on Lt in all planes x 5 minutes Neuromuscular re-ed: N/A Therapeutic Activity: N/A Modalities: Cryotherapy to Lt knee with knee elevated in supine x10 minutes Self Care: N/A   [facility] Adult PT Treatment:                                                DATE: 07/22/2021 Therapeutic Exercise: Supine SLR with abduction x10 on Lt Supine Lt knee flexion AAROM with sheet x10 Seated Lt patellar self-mobilizations medial/ lateral x10 each Manual Therapy: N/A Neuromuscular re-ed: N/A Therapeutic Activity: N/A Modalities: N/A Self Care: N/A       PATIENT EDUCATION:  Education details: n/a Person educated: n/a Education  method: n/a Education comprehension: n/a     HOME EXERCISE PROGRAM: Access Code: [email or web address] URL: [email or web address] Date: 07/22/2021 Prepared by: Vanessa [place]   Exercises Heel slide with strap, LAYING  ON BACK - 2 x daily - 7 x weekly - 3 sets - 10 reps - 5-sec hold Long Sitting 4 Way Patellar Glide - 2 x daily - 7 x weekly - 3 sets - 10 reps Straight leg raise while pulling leg away from body (abduction) - 2 x daily - 7 x weekly - 3 sets - 10 reps  Added 07/27/2021: Seated Hamstring Stretch - 2 x daily - 7 x weekly - 2 sets - 1-min hold Step Up - 2 x daily - 7 x weekly - 3 sets - 10 reps     ASSESSMENT:   CLINICAL IMPRESSION: Lt knee flexion AROM continues to improve achieving 99 degrees today. Her incision site is healing well with no obvious signs of infection. Continued with quad strengthening and initiated hip strengthening, which she tolerated well without increased pain. Mild restriction noted with superior and inferior patellar mobilizations.    REHAB POTENTIAL: Excellent   CLINICAL DECISION MAKING: Stable/uncomplicated   EVALUATION COMPLEXITY: Low     GOALS: Goals reviewed with patient? Yes   SHORT TERM GOALS:   STG Name Target Date Goal status  1 Pt will report understanding and adherence to her HEP in order to promote independence in the management of her primary impairments. Baseline: HEP provided at eval. 08/19/2021 INITIAL  2 Pt will achieve Lt knee flexion AROM of 100 degrees in order to promote WNL gait. Baseline: 56 degrees 08/19/2021 INITIAL    LONG TERM GOALS:    LTG Name Target Date Goal status  1 Pt will achieve Lt knee flexion AROM of 120 degrees in order to put on her shoes and socks with less limitation. Baseline: 56 degrees 09/16/2021 INITIAL  2 Pt will report ability to walk >30 minutes without an AD and with 0-2/10 pain in order to grocery shop with less limitation. Baseline: >5/10 pain after 3-4 minutes of walking 09/16/2021  INITIAL  3 Pt will achieve Lt knee flexion and extension MMT of 4+/5 in order to perform squats when doing yard work with less limitation. Baseline: 2+/5 09/16/2021 INITIAL  4 Pt will report ability to sit >30 minutes with 0-2/10 pain in order to ride or drive in her car with less limitation. Baseline: Unable to sit >15 minutes without >5/10 pain 09/16/2021 INITIAL    PLAN: PT FREQUENCY: 2x/week   PT DURATION: 8 weeks   PLANNED INTERVENTIONS: Therapeutic exercises, Therapeutic activity, Neuro Muscular re-education, Balance training, Gait training, Patient/Family education, Joint mobilization, Stair training, Dry Needling, Cryotherapy, Manual lymph drainage, scar mobilization, Taping, and Manual therapy   PLAN FOR NEXT SESSION: Progress knee flexion AROM, early quad/ hip strengthening; updated HEP   Gwendolyn Grant, PT, DPT, ATC 08/02/21 12:28 PM

## 2021-08-02 ENCOUNTER — Ambulatory Visit: Payer: Medicaid Other

## 2021-08-02 ENCOUNTER — Other Ambulatory Visit: Payer: Self-pay

## 2021-08-02 DIAGNOSIS — M6281 Muscle weakness (generalized): Secondary | ICD-10-CM | POA: Diagnosis not present

## 2021-08-02 DIAGNOSIS — R262 Difficulty in walking, not elsewhere classified: Secondary | ICD-10-CM | POA: Diagnosis not present

## 2021-08-02 DIAGNOSIS — M25562 Pain in left knee: Secondary | ICD-10-CM

## 2021-08-02 DIAGNOSIS — G8929 Other chronic pain: Secondary | ICD-10-CM

## 2021-08-02 DIAGNOSIS — M25661 Stiffness of right knee, not elsewhere classified: Secondary | ICD-10-CM | POA: Diagnosis not present

## 2021-08-03 NOTE — Therapy (Signed)
OUTPATIENT PHYSICAL THERAPY TREATMENT NOTE   Patient Name: Denise Morales MRN: [id number] DOB:10-29-1960, 61 y.o., female Today's Date: 08/04/2021  PCP: Everardo Beals, NP REFERRING PROVIDER: Mcarthur Rossetti*   PT End of Session - 08/04/21 1100     Visit Number 4    Number of Visits 17    Date for PT Re-Evaluation 09/16/21    Authorization Type [place] [employer]    Authorization Time Period Pending auth    PT Start Time 1102    PT Stop Time 1146    PT Time Calculation (min) 44 min    Activity Tolerance Patient tolerated treatment well    Behavior During Therapy WFL for tasks assessed/performed               Past Medical History:  Diagnosis Date   Anxiety    Arthritis    Depression    Fibromyalgia    GERD (gastroesophageal reflux disease)    Hyperlipidemia    Hypertension    Hypothyroidism    Melanoma ([place])    face   Thyroid disease    Past Surgical History:  Procedure Laterality Date   ABDOMINAL HYSTERECTOMY  1985   CHOLECYSTECTOMY     COLONOSCOPY     SHOULDER ARTHROSCOPY WITH ROTATOR CUFF REPAIR AND SUBACROMIAL DECOMPRESSION Right 08/12/2020   Procedure: RIGHT SHOULDER ARTHROSCOPY WITH ROTATOR CUFF REPAIR AND SUBACROMIAL DECOMPRESSION;  Surgeon: Mcarthur Rossetti, MD;  Location: [place];  Service: Orthopedics;  Laterality: Right;   TOTAL KNEE ARTHROPLASTY Right 12/24/2020   Procedure: RIGHT TOTAL KNEE ARTHROPLASTY;  Surgeon: Mcarthur Rossetti, MD;  Location[facility];  Service: Orthopedics;  Laterality: Right;   TOTAL KNEE ARTHROPLASTY Left 07/15/2021   Procedure: LEFT TOTAL KNEE ARTHROPLASTY;  Surgeon: Mcarthur Rossetti, MD;  Location: [facility];  Service: Orthopedics;  Laterality: Left;   Patient Active Problem List   Diagnosis Date Noted   Status post total left knee replacement 07/15/2021   Neoplasm of uncertain behavior of skin 05/25/2021   Generalized osteoarthritis of multiple sites 05/25/2021    Fibromyalgia syndrome 05/25/2021   Restless legs 03/16/2021   Hyperlipidemia 03/16/2021   Anxiety 03/16/2021   Status post right knee replacement 12/24/2020   Unilateral primary osteoarthritis, left knee 11/11/2020   Unilateral primary osteoarthritis, right knee 11/11/2020   Complete tear of right rotator cuff 08/12/2020   Hypertensive disorder 05/05/2019    REFERRING DIAG: H37.169 (ICD-10-CM) - Status post left knee replacement  THERAPY DIAG:  Chronic pain of left knee  Muscle weakness (generalized)  Difficulty in walking, not elsewhere classified  PERTINENT HISTORY: Lt TKA on 07/15/2021 with Dr. Jean Rosenthal  PRECAUTIONS: Knee and Fall  SUBJECTIVE:  Patient reports the knee is feeling ok today.  PAIN:  Are you having pain? Yes NPRS scale: 2/10 Pain location: posterior and anteromedial knee  PAIN TYPE: pulling  Pain description: intermittent      OBJECTIVE:   *Unless otherwise noted, objective measures collected previously*   COGNITION:          Overall cognitive status: Within functional limits for tasks assessed                        SENSATION:          Light touch: Appears intact     POSTURE:  Forward posture in FWW, decreased Lt knee extension in stance and terminal swing   OBSERVATION: Circumferential measure at superior pole of patella: Rt: 45cm, Lt:  50cm Post-op bandage in-tact, surgical incision not visible  08/02/21: incision site healing well with no signs of infection   LE AROM/PROM:   A/PROM Right 07/21/2021 Left 07/21/2021 Left 07/27/2021 Left 08/02/21  Knee flexion 115/120p! 56p! 88p! 99  Knee extension 3/5 -9p! -9p!    (Blank rows = not tested)   LE MMT:   MMT Right 07/21/2021 Left 07/21/2021  Hip flexion 4/5 3+/5 p!  Hip extension      Hip abduction 4/5 2+/5  Knee flexion 5/5 2+/5p!  Knee extension 5/5 2+/5p!   (Blank rows = not tested)     FUNCTIONAL TESTS:  5 times sit to stand: 14 seconds   GAIT: Distance walked: 20  ft Assistive device utilized: Walker - 2 wheeled Level of assistance: Modified independence Comments: Pt ambulates with slow gait speed and antalgic gait. Decreased Lt knee extension in stance and terminal swing.       TODAY'S TREATMENT: [facility] Adult PT Treatment:                                                DATE: 08/04/21 Therapeutic Exercise: Nustep level 4 x 5 minutes; focusing on knee ROM; UE/LE  SAQ LLE 2 x 10  Updated HEP Manual Therapy: Patellar mobilizations all planes grade II-III Lt knee PROM flexion and extension to tolerance Gait Training in //bars: Focusing on improving step length and allowing for heel strike, push-off, and appropriate hip/knee flexion during gait cycle on the LLE; once LE mechanics improved worked on arm swing during gait cycle.     Initially completed with single UE support, eventually completed with no UE support   [facility] Adult PT Treatment:                                                DATE: 08/02/21 Therapeutic Exercise: Heel slides 1 x 10 LLE SAQ 2 x 10 LLE Resisted hip abduction in hooklying 2 x 10 blue band  Sit to stand from raised height 2 x 10  Standing calf raise 2 x 10  Lateral weight shifts x 20  Forward weight shifts x 10 each  Seated knee extension AAROM Lt 1 x 10  Manual Therapy: Patellar mobilizations all planes grade II-III Lt knee PROM flexion and extension to tolerance   [facility] Adult PT Treatment:                                                DATE: 07/27/2021 Therapeutic Exercise: Seated active hamstring stretch 2x55mn on Lt Step up on 4-inch step 3x10 Supine Lt knee flexion AAROM x10 with 5-sec hold Supine SLR to 6" and 12"; 2x10 on Lt Manual Therapy: Supine Lt knee flexion/ extension contract/ co-contract MET x5 with 5-sec hold Supine grade 3 patellar mobilization on Lt in all planes x 5 minutes Neuromuscular re-ed: N/A Therapeutic Activity: N/A Modalities: Cryotherapy to Lt knee with knee elevated in supine x10  minutes Self Care: N/A   [facility]  [facility] Therapeutic Exercise: Supine SLR with abduction x10 on Lt Supine Lt knee flexion AAROM with sheet x10 Seated Lt patellar self-mobilizations medial/ lateral x10 each Manual Therapy: N/A Neuromuscular re-ed: N/A Therapeutic Activity: N/A Modalities: N/A Self Care: N/A       PATIENT EDUCATION:  Education details: see treatment  Person educated: patient Education method: demo, verbal cues, handout Education comprehension: verbalized understanding      HOME EXERCISE PROGRAM: Access Code: [email or web address] URL: [email or web address] Date: 07/22/2021 Prepared by: Vanessa [place]   Exercises Heel slide with strap, LAYING ON BACK - 2 x daily - 7 x weekly - 3 sets - 10 reps - 5-sec hold Long Sitting 4 Way Patellar Glide - 2 x daily - 7 x weekly - 3 sets - 10 reps Straight leg raise while pulling leg away from body (abduction) - 2 x daily - 7 x weekly - 3 sets - 10 reps  Added 07/27/2021: Seated Hamstring Stretch - 2 x daily - 7 x weekly - 2 sets - 1-min hold Step Up - 2 x daily - 7 x weekly - 3 sets - 10 reps     ASSESSMENT:   CLINICAL IMPRESSION: Patient tolerated session well today focusing on gait training. Time spent working on allowing for equal step length, and appropriate foot/knee positioning/movement during gait cycle on the LLE in the parallel bars. She is able to demonstrate appropriate sequencing with continued practice and eventually able to perform without need for UE support. Lateral trunk lean/pelvic drop is present during stance on the LLE likely due to hip weakness/balance deficits that will continue to be addressed. It was recommended that she continue to utilize her RW currently for ambulation and bring her cane at next session to began gait training with this device.    REHAB POTENTIAL: Excellent   CLINICAL DECISION MAKING:  Stable/uncomplicated   EVALUATION COMPLEXITY: Low     GOALS: Goals reviewed with patient? Yes   SHORT TERM GOALS:   STG Name Target Date Goal status  1 Pt will report understanding and adherence to her HEP in order to promote independence in the management of her primary impairments. Baseline: HEP provided at eval. 08/19/2021 INITIAL  2 Pt will achieve Lt knee flexion AROM of 100 degrees in order to promote WNL gait. Baseline: 56 degrees 08/19/2021 INITIAL    LONG TERM GOALS:    LTG Name Target Date Goal status  1 Pt will achieve Lt knee flexion AROM of 120 degrees in order to put on her shoes and socks with less limitation. Baseline: 56 degrees 09/16/2021 INITIAL  2 Pt will report ability to walk >30 minutes without an AD and with 0-2/10 pain in order to grocery shop with less limitation. Baseline: >5/10 pain after 3-4 minutes of walking 09/16/2021 INITIAL  3 Pt will achieve Lt knee flexion and extension MMT of 4+/5 in order to perform squats when doing yard work with less limitation. Baseline: 2+/5 09/16/2021 INITIAL  4 Pt will report ability to sit >30 minutes with 0-2/10 pain in order to ride or drive in her car with less limitation. Baseline: Unable to sit >15 minutes without >5/10 pain 09/16/2021 INITIAL    PLAN: PT FREQUENCY: 2x/week   PT DURATION: 8 weeks   PLANNED INTERVENTIONS: Therapeutic exercises, Therapeutic activity, Neuro Muscular re-education, Balance training, Gait training, Patient/Family education, Joint mobilization, Stair training, Dry Needling, Cryotherapy, Manual lymph drainage, scar mobilization, Taping, and Manual therapy   PLAN FOR NEXT SESSION: Progress knee flexion AROM, early  quad/ hip strengthening;gait training    Gwendolyn Grant, PT, DPT, ATC 08/04/21 11:51 AM

## 2021-08-04 ENCOUNTER — Other Ambulatory Visit: Payer: Self-pay

## 2021-08-04 ENCOUNTER — Ambulatory Visit: Payer: Medicaid Other

## 2021-08-04 DIAGNOSIS — M25562 Pain in left knee: Secondary | ICD-10-CM | POA: Diagnosis not present

## 2021-08-04 DIAGNOSIS — R262 Difficulty in walking, not elsewhere classified: Secondary | ICD-10-CM | POA: Diagnosis not present

## 2021-08-04 DIAGNOSIS — G8929 Other chronic pain: Secondary | ICD-10-CM

## 2021-08-04 DIAGNOSIS — M6281 Muscle weakness (generalized): Secondary | ICD-10-CM

## 2021-08-04 DIAGNOSIS — M25661 Stiffness of right knee, not elsewhere classified: Secondary | ICD-10-CM | POA: Diagnosis not present

## 2021-08-09 ENCOUNTER — Other Ambulatory Visit: Payer: Self-pay

## 2021-08-09 ENCOUNTER — Ambulatory Visit: Payer: Medicaid Other

## 2021-08-09 DIAGNOSIS — M6281 Muscle weakness (generalized): Secondary | ICD-10-CM

## 2021-08-09 DIAGNOSIS — R262 Difficulty in walking, not elsewhere classified: Secondary | ICD-10-CM | POA: Diagnosis not present

## 2021-08-09 DIAGNOSIS — M25562 Pain in left knee: Secondary | ICD-10-CM | POA: Diagnosis not present

## 2021-08-09 DIAGNOSIS — G8929 Other chronic pain: Secondary | ICD-10-CM | POA: Diagnosis not present

## 2021-08-09 DIAGNOSIS — M25661 Stiffness of right knee, not elsewhere classified: Secondary | ICD-10-CM | POA: Diagnosis not present

## 2021-08-09 NOTE — Therapy (Signed)
OUTPATIENT PHYSICAL THERAPY TREATMENT NOTE   Patient Name: Denise Morales MRN: [id number] DOB:07/10/1960, 61 y.o., female Today's Date: 08/09/2021  PCP: Everardo Beals, NP REFERRING PROVIDER: Everardo Beals, NP   PT End of Session - 08/09/21 1231     Visit Number 5    Number of Visits 17    Date for PT Re-Evaluation 09/16/21    Authorization Type [place] [employer]    Authorization Time Period 2/1-09/24/21    Authorization - Visit Number 4    Authorization - Number of Visits 16    PT Start Time 5852    PT Stop Time 1315    PT Time Calculation (min) 44 min    Activity Tolerance Patient tolerated treatment well    Behavior During Therapy WFL for tasks assessed/performed                Past Medical History:  Diagnosis Date   Anxiety    Arthritis    Depression    Fibromyalgia    GERD (gastroesophageal reflux disease)    Hyperlipidemia    Hypertension    Hypothyroidism    Melanoma ([place])    face   Thyroid disease    Past Surgical History:  Procedure Laterality Date   ABDOMINAL HYSTERECTOMY  1985   CHOLECYSTECTOMY     COLONOSCOPY     SHOULDER ARTHROSCOPY WITH ROTATOR CUFF REPAIR AND SUBACROMIAL DECOMPRESSION Right 08/12/2020   Procedure: RIGHT SHOULDER ARTHROSCOPY WITH ROTATOR CUFF REPAIR AND SUBACROMIAL DECOMPRESSION;  Surgeon: Mcarthur Rossetti, MD;  Location: [facility];  Service: Orthopedics;  Laterality: Right;   TOTAL KNEE ARTHROPLASTY Right 12/24/2020   Procedure: RIGHT TOTAL KNEE ARTHROPLASTY;  Surgeon: Mcarthur Rossetti, MD;  Location: [facility];  Service: Orthopedics;  Laterality: Right;   TOTAL KNEE ARTHROPLASTY Left 07/15/2021   Procedure: LEFT TOTAL KNEE ARTHROPLASTY;  Surgeon: Mcarthur Rossetti, MD;  Location: [facility];  Service: Orthopedics;  Laterality: Left;   Patient Active Problem List   Diagnosis Date Noted   Status post total left knee replacement 07/15/2021   Neoplasm of uncertain behavior of skin  05/25/2021   Generalized osteoarthritis of multiple sites 05/25/2021   Fibromyalgia syndrome 05/25/2021   Restless legs 03/16/2021   Hyperlipidemia 03/16/2021   Anxiety 03/16/2021   Status post right knee replacement 12/24/2020   Unilateral primary osteoarthritis, left knee 11/11/2020   Unilateral primary osteoarthritis, right knee 11/11/2020   Complete tear of right rotator cuff 08/12/2020   Hypertensive disorder 05/05/2019    REFERRING DIAG: D78.242 (ICD-10-CM) - Status post left knee replacement  THERAPY DIAG:  Chronic pain of left knee  Muscle weakness (generalized)  Difficulty in walking, not elsewhere classified  PERTINENT HISTORY: Lt TKA on 07/15/2021 with Dr. Jean Rosenthal  PRECAUTIONS: Knee and Fall  SUBJECTIVE:  Patient reports the knee is feeling the same as last week along the sides that is constant. She reports the knee is still very stiff. She reports occasional pain that radiates from the hip to the calf.  PAIN:  Are you having pain? Yes NPRS scale: 2/10 Pain location: medial and lateral Lt knee  PAIN TYPE: tightness; ache   Pain description: constant      OBJECTIVE:   *Unless otherwise noted, objective measures collected previously*     OBSERVATION: Circumferential measure at superior pole of patella: Rt: 45cm, Lt: 50cm Post-op bandage in-tact, surgical incision not visible  08/02/21: incision site healing well with no signs of infection   LE AROM/PROM:   A/PROM  Right 07/21/2021 Left 07/21/2021 Left 07/27/2021 Left 08/02/21 08/09/21  Knee flexion 115/120p! 56p! 88p! 99 106  Knee extension 3/5 -9p! -9p!     (Blank rows = not tested)   LE MMT:   MMT Right 07/21/2021 Left 07/21/2021  Hip flexion 4/5 3+/5 p!  Hip extension      Hip abduction 4/5 2+/5  Knee flexion 5/5 2+/5p!  Knee extension 5/5 2+/5p!   (Blank rows = not tested)     FUNCTIONAL TESTS:  5 times sit to stand: 14 seconds   GAIT: Distance walked: 20 ft Assistive device  utilized: Walker - 2 wheeled Level of assistance: Modified independence Comments: Pt ambulates with slow gait speed and antalgic gait. Decreased Lt knee extension in stance and terminal swing.       TODAY'S TREATMENT: [facility] Adult PT Treatment:                                                DATE: 08/09/21 Therapeutic Exercise: Nustep level 4 x 5 minutes UE/LE focusing on knee ROM Prone quad stretch 10 x 10 seconds LLE SLR 2 x 8 LLE Calf stretch on wedge 30 sec  Manual Therapy: Patellar mobilizations all planes grade II-III Lt knee PROM flexion and extension to tolerance STM/trigger point release Lt distal quadriceps, IT band, adductors   [facility] Adult PT Treatment:                                                DATE: 08/04/21 Therapeutic Exercise: Nustep level 4 x 5 minutes; focusing on knee ROM; UE/LE  SAQ LLE 2 x 10  Updated HEP Manual Therapy: Patellar mobilizations all planes grade II-III Lt knee PROM flexion and extension to tolerance Gait Training in //bars: Focusing on improving step length and allowing for heel strike, push-off, and appropriate hip/knee flexion during gait cycle on the LLE; once LE mechanics improved worked on arm swing during gait cycle.     Initially completed with single UE support, eventually completed with no UE support   [facility] Adult [facility] Treatment:                                                DATE: 08/02/21 Therapeutic Exercise: Heel slides 1 x 10 LLE SAQ 2 x 10 LLE Resisted hip abduction in hooklying 2 x 10 blue band  Sit to stand from raised height 2 x 10  Standing calf raise 2 x 10  Lateral weight shifts x 20  Forward weight shifts x 10 each  Seated knee extension AAROM Lt 1 x 10  Manual Therapy: Patellar mobilizations all planes grade II-III Lt knee PROM flexion and extension to tolerance           PATIENT EDUCATION:  Education details: n/a Person educated: n/a Education method: n/a Education comprehension: n/a     HOME EXERCISE  PROGRAM: Access Code: [email or web address] URL: [email or web address] Date: 07/22/2021 Prepared by: Vanessa Drew   Exercises Heel slide with strap, LAYING ON BACK - 2 x daily - 7 x weekly - 3 sets - 10 reps - 5-sec hold  Long Sitting 4 Way Patellar Glide - 2 x daily - 7 x weekly - 3 sets - 10 reps Straight leg raise while pulling leg away from body (abduction) - 2 x daily - 7 x weekly - 3 sets - 10 reps  Added 07/27/2021: Seated Hamstring Stretch - 2 x daily - 7 x weekly - 2 sets - 1-min hold Step Up - 2 x daily - 7 x weekly - 3 sets - 10 reps     ASSESSMENT:   CLINICAL IMPRESSION: Heavy manual therapy focus today to assist in reduction of patient's feelings of tightness/stiffness of the Lt knee. She has multiple trigger points throughout distal quadriceps with partial release from manual therapy. Improved tolerance to passive flexion following STM of the quadriceps. Patellar mobility is WNL in all planes, though patient reports mild discomfort with superior/inferior glides. No signs of quad lag with SLR. Knee flexion AROM continues to gradually improve.    REHAB POTENTIAL: Excellent   CLINICAL DECISION MAKING: Stable/uncomplicated   EVALUATION COMPLEXITY: Low     GOALS: Goals reviewed with patient? Yes   SHORT TERM GOALS:   STG Name Target Date Goal status  1 Pt will report understanding and adherence to her HEP in order to promote independence in the management of her primary impairments. Baseline: HEP provided at eval. 08/19/2021 achieved  2 Pt will achieve Lt knee flexion AROM of 100 degrees in order to promote WNL gait. Baseline: 56 degrees 08/19/2021 Achieved     LONG TERM GOALS:    LTG Name Target Date Goal status  1 Pt will achieve Lt knee flexion AROM of 120 degrees in order to put on her shoes and socks with less limitation. Baseline: 56 degrees 09/16/2021 INITIAL  2 Pt will report ability to walk >30 minutes without an AD and with 0-2/10 pain in order to  grocery shop with less limitation. Baseline: >5/10 pain after 3-4 minutes of walking 09/16/2021 INITIAL  3 Pt will achieve Lt knee flexion and extension MMT of 4+/5 in order to perform squats when doing yard work with less limitation. Baseline: 2+/5 09/16/2021 INITIAL  4 Pt will report ability to sit >30 minutes with 0-2/10 pain in order to ride or drive in her car with less limitation. Baseline: Unable to sit >15 minutes without >5/10 pain 09/16/2021 INITIAL    PLAN: PT FREQUENCY: 2x/week   PT DURATION: 8 weeks   PLANNED INTERVENTIONS: Therapeutic exercises, Therapeutic activity, Neuro Muscular re-education, Balance training, Gait training, Patient/Family education, Joint mobilization, Stair training, Dry Needling, Cryotherapy, Manual lymph drainage, scar mobilization, Taping, and Manual therapy   PLAN FOR NEXT SESSION: Progress knee flexion AROM, early quad/ hip strengthening;gait training    Gwendolyn Grant, PT, DPT, ATC 08/09/21 2:00 PM

## 2021-08-10 NOTE — Therapy (Signed)
OUTPATIENT PHYSICAL THERAPY TREATMENT NOTE   Patient Name: Denise Morales MRN: [id number] DOB:Jul 20, 1960, 61 y.o., female Today's Date: 08/11/2021  PCP: Everardo Beals, NP REFERRING PROVIDER: Mcarthur Rossetti*   PT End of Session - 08/11/21 1059     Visit Number 6    Number of Visits 17    Date for PT Re-Evaluation 09/16/21    Authorization Type [place] [employer]    Authorization Time Period 2/1-09/24/21    Authorization - Visit Number 5    Authorization - Number of Visits 16    PT Start Time 1100    PT Stop Time 1884    PT Time Calculation (min) 43 min    Activity Tolerance Patient tolerated treatment well    Behavior During Therapy WFL for tasks assessed/performed                 Past Medical History:  Diagnosis Date   Anxiety    Arthritis    Depression    Fibromyalgia    GERD (gastroesophageal reflux disease)    Hyperlipidemia    Hypertension    Hypothyroidism    Melanoma ([place])    face   Thyroid disease    Past Surgical History:  Procedure Laterality Date   ABDOMINAL HYSTERECTOMY  1985   CHOLECYSTECTOMY     COLONOSCOPY     SHOULDER ARTHROSCOPY WITH ROTATOR CUFF REPAIR AND SUBACROMIAL DECOMPRESSION Right 08/12/2020   Procedure: RIGHT SHOULDER ARTHROSCOPY WITH ROTATOR CUFF REPAIR AND SUBACROMIAL DECOMPRESSION;  Surgeon: Mcarthur Rossetti, MD;  Location: [place];  Service: Orthopedics;  Laterality: Right;   TOTAL KNEE ARTHROPLASTY Right 12/24/2020   Procedure: RIGHT TOTAL KNEE ARTHROPLASTY;  Surgeon: Mcarthur Rossetti, MD;  Location: [facility];  Service: Orthopedics;  Laterality: Right;   TOTAL KNEE ARTHROPLASTY Left 07/15/2021   Procedure: LEFT TOTAL KNEE ARTHROPLASTY;  Surgeon: Mcarthur Rossetti, MD;  Location: [facility];  Service: Orthopedics;  Laterality: Left;   Patient Active Problem List   Diagnosis Date Noted   Status post total left knee replacement 07/15/2021   Neoplasm of uncertain behavior of skin  05/25/2021   Generalized osteoarthritis of multiple sites 05/25/2021   Fibromyalgia syndrome 05/25/2021   Restless legs 03/16/2021   Hyperlipidemia 03/16/2021   Anxiety 03/16/2021   Status post right knee replacement 12/24/2020   Unilateral primary osteoarthritis, left knee 11/11/2020   Unilateral primary osteoarthritis, right knee 11/11/2020   Complete tear of right rotator cuff 08/12/2020   Hypertensive disorder 05/05/2019    REFERRING DIAG: Z66.063 (ICD-10-CM) - Status post left knee replacement  THERAPY DIAG:  Chronic pain of left knee  Muscle weakness (generalized)  Difficulty in walking, not elsewhere classified  PERTINENT HISTORY: Lt TKA on 07/15/2021 with Dr. Jean Rosenthal  PRECAUTIONS: Knee and Fall  SUBJECTIVE:  Patient reports the knee is feeling about the same. It is still feeling tight along the sides and the back of the knee. No calf pain.  PAIN:  Are you having pain? Yes NPRS scale: 3/10 Pain location: medial, lateral, and posterior Lt knee  PAIN TYPE: tightness Pain description: intermittent      OBJECTIVE:   *Unless otherwise noted, objective measures collected previously*   PALPATION: 08/11/21: TTP medial and lateral joint line, TTP distal medial and lateral hamstrings; no palpable tenderness about popliteal fossa or calf.   OBSERVATION: Circumferential measure at superior pole of patella: Rt: 45cm, Lt: 50cm Post-op bandage in-tact, surgical incision not visible  08/02/21: incision site healing well with no  signs of infection   LE AROM/PROM:   A/PROM Right 07/21/2021 Left 07/21/2021 Left 07/27/2021 Left 08/02/21 08/09/21 08/11/21  Knee flexion 115/120p! 56p! 88p! 99 106 118  Knee extension 3/5 -9p! -9p!      (Blank rows = not tested)   LE MMT:   MMT Right 07/21/2021 Left 07/21/2021  Hip flexion 4/5 3+/5 p!  Hip extension      Hip abduction 4/5 2+/5  Knee flexion 5/5 2+/5p!  Knee extension 5/5 2+/5p!   (Blank rows = not tested)      FUNCTIONAL TESTS:  5 times sit to stand: 14 seconds   GAIT: Distance walked: 20 ft Assistive device utilized: Walker - 2 wheeled Level of assistance: Modified independence Comments: Pt ambulates with slow gait speed and antalgic gait. Decreased Lt knee extension in stance and terminal swing.       TODAY'S TREATMENT: [facility] Adult PT Treatment:                                                DATE: 08/11/21 Therapeutic Exercise: LAQ 2 x 10; LLE TKE 2 x 10; LLE green band Seated hamstring curl green band 2 x 10 LLE  Hip bridge 2 x 10 Lt supine hip flexor stretch 60 seconds Sidelying hip abduction 2 x 10 LLE  Manual Therapy: Lt knee PROM flexion and extension to tolerance   Self Care: Adjusted SPC to appropriate height  Modalities for pain control    [facility] Adult PT Treatment:                                                DATE: 08/09/21 Therapeutic Exercise: Nustep level 4 x 5 minutes UE/LE focusing on knee ROM Prone quad stretch 10 x 10 seconds LLE SLR 2 x 8 LLE Calf stretch on wedge 30 sec  Manual Therapy: Patellar mobilizations all planes grade II-III Lt knee PROM flexion and extension to tolerance STM/trigger point release Lt distal quadriceps, IT band, adductors   [facility] Adult PT Treatment:                                                DATE: 08/04/21 Therapeutic Exercise: Nustep level 4 x 5 minutes; focusing on knee ROM; UE/LE  SAQ LLE 2 x 10  Updated HEP Manual Therapy: Patellar mobilizations all planes grade II-III Lt knee PROM flexion and extension to tolerance Gait Training in //bars: Focusing on improving step length and allowing for heel strike, push-off, and appropriate hip/knee flexion during gait cycle on the LLE; once LE mechanics improved worked on arm swing during gait cycle.     Initially completed with single UE support, eventually completed with no UE support    PATIENT EDUCATION:  Education details: see treatment  Person educated: patient Education  method: instruction  Education comprehension: verbalized understanding      HOME EXERCISE PROGRAM: Access Code: [email or web address] URL: [email or web address] Date: 07/22/2021 Prepared by: Vanessa [place]   Exercises Heel slide with strap, LAYING ON BACK - 2 x daily - 7 x weekly - 3 sets - 10 reps - 5-sec  hold Long Sitting 4 Way Patellar Glide - 2 x daily - 7 x weekly - 3 sets - 10 reps Straight leg raise while pulling leg away from body (abduction) - 2 x daily - 7 x weekly - 3 sets - 10 reps  Added 07/27/2021: Seated Hamstring Stretch - 2 x daily - 7 x weekly - 2 sets - 1-min hold Step Up - 2 x daily - 7 x weekly - 3 sets - 10 reps     ASSESSMENT:   CLINICAL IMPRESSION: Patient arrives with continued complaints of tightness about the knee. She has palpable tenderness about medial and lateral joint line as well as distal hamstrings. Good tolerance to Lt knee PROM with patient achieving 118 degrees of knee flexion AROM today. Able to progress hip and knee strengthening without reports of increased pain or tightness.    REHAB POTENTIAL: Excellent   CLINICAL DECISION MAKING: Stable/uncomplicated   EVALUATION COMPLEXITY: Low     GOALS: Goals reviewed with patient? Yes   SHORT TERM GOALS:   STG Name Target Date Goal status  1 Pt will report understanding and adherence to her HEP in order to promote independence in the management of her primary impairments. Baseline: HEP provided at eval. 08/19/2021 achieved  2 Pt will achieve Lt knee flexion AROM of 100 degrees in order to promote WNL gait. Baseline: 56 degrees 08/19/2021 Achieved     LONG TERM GOALS:    LTG Name Target Date Goal status  1 Pt will achieve Lt knee flexion AROM of 120 degrees in order to put on her shoes and socks with less limitation. Baseline: 56 degrees 09/16/2021 INITIAL  2 Pt will report ability to walk >30 minutes without an AD and with 0-2/10 pain in order to grocery shop with less  limitation. Baseline: >5/10 pain after 3-4 minutes of walking 09/16/2021 INITIAL  3 Pt will achieve Lt knee flexion and extension MMT of 4+/5 in order to perform squats when doing yard work with less limitation. Baseline: 2+/5 09/16/2021 INITIAL  4 Pt will report ability to sit >30 minutes with 0-2/10 pain in order to ride or drive in her car with less limitation. Baseline: Unable to sit >15 minutes without >5/10 pain 09/16/2021 INITIAL    PLAN: PT FREQUENCY: 2x/week   PT DURATION: 8 weeks   PLANNED INTERVENTIONS: Therapeutic exercises, Therapeutic activity, Neuro Muscular re-education, Balance training, Gait training, Patient/Family education, Joint mobilization, Stair training, Dry Needling, Cryotherapy, Manual lymph drainage, scar mobilization, Taping, and Manual therapy   PLAN FOR NEXT SESSION: Progress knee flexion AROM, early quad/ hip strengthening;gait training; update HEP   Gwendolyn Grant, PT, DPT, ATC 08/11/21 11:44 AM

## 2021-08-11 ENCOUNTER — Other Ambulatory Visit: Payer: Self-pay

## 2021-08-11 ENCOUNTER — Ambulatory Visit: Payer: Medicaid Other

## 2021-08-11 DIAGNOSIS — M6281 Muscle weakness (generalized): Secondary | ICD-10-CM

## 2021-08-11 DIAGNOSIS — G8929 Other chronic pain: Secondary | ICD-10-CM

## 2021-08-11 DIAGNOSIS — M25562 Pain in left knee: Secondary | ICD-10-CM | POA: Diagnosis not present

## 2021-08-11 DIAGNOSIS — R262 Difficulty in walking, not elsewhere classified: Secondary | ICD-10-CM

## 2021-08-11 DIAGNOSIS — M25661 Stiffness of right knee, not elsewhere classified: Secondary | ICD-10-CM | POA: Diagnosis not present

## 2021-08-13 NOTE — Therapy (Signed)
OUTPATIENT PHYSICAL THERAPY TREATMENT NOTE   Patient Name: Denise Morales MRN: [id number] DOB:Feb 05, 1961, 61 y.o., female Today's Date: 08/16/2021  PCP: Everardo Beals, NP REFERRING PROVIDER: Everardo Beals, NP   PT End of Session - 08/16/21 1044     Visit Number 7    Number of Visits 17    Date for PT Re-Evaluation 09/16/21    Authorization Type [place] [employer]    Authorization Time Period 2/1-09/24/21    Authorization - Visit Number 6    Authorization - Number of Visits 16    PT Start Time 2979    PT Stop Time 1125    PT Time Calculation (min) 40 min    Equipment Utilized During Treatment Gait belt    Activity Tolerance Patient tolerated treatment well    Behavior During Therapy WFL for tasks assessed/performed                  Past Medical History:  Diagnosis Date   Anxiety    Arthritis    Depression    Fibromyalgia    GERD (gastroesophageal reflux disease)    Hyperlipidemia    Hypertension    Hypothyroidism    Melanoma ([place])    face   Thyroid disease    Past Surgical History:  Procedure Laterality Date   ABDOMINAL HYSTERECTOMY  1985   CHOLECYSTECTOMY     COLONOSCOPY     SHOULDER ARTHROSCOPY WITH ROTATOR CUFF REPAIR AND SUBACROMIAL DECOMPRESSION Right 08/12/2020   Procedure: RIGHT SHOULDER ARTHROSCOPY WITH ROTATOR CUFF REPAIR AND SUBACROMIAL DECOMPRESSION;  Surgeon: Mcarthur Rossetti, MD;  Location: [facility];  Service: Orthopedics;  Laterality: Right;   TOTAL KNEE ARTHROPLASTY Right 12/24/2020   Procedure: RIGHT TOTAL KNEE ARTHROPLASTY;  Surgeon: Mcarthur Rossetti, MD;  Location: [facility];  Service: Orthopedics;  Laterality: Right;   TOTAL KNEE ARTHROPLASTY Left 07/15/2021   Procedure: LEFT TOTAL KNEE ARTHROPLASTY;  Surgeon: Mcarthur Rossetti, MD;  Location: [facility];  Service: Orthopedics;  Laterality: Left;   Patient Active Problem List   Diagnosis Date Noted   Status post total left knee replacement  07/15/2021   Neoplasm of uncertain behavior of skin 05/25/2021   Generalized osteoarthritis of multiple sites 05/25/2021   Fibromyalgia syndrome 05/25/2021   Restless legs 03/16/2021   Hyperlipidemia 03/16/2021   Anxiety 03/16/2021   Status post right knee replacement 12/24/2020   Unilateral primary osteoarthritis, left knee 11/11/2020   Unilateral primary osteoarthritis, right knee 11/11/2020   Complete tear of right rotator cuff 08/12/2020   Hypertensive disorder 05/05/2019    REFERRING DIAG: G92.119 (ICD-10-CM) - Status post left knee replacement  THERAPY DIAG:  Chronic pain of left knee  Muscle weakness (generalized)  Difficulty in walking, not elsewhere classified  PERTINENT HISTORY: Lt TKA on 07/15/2021 with Dr. Jean Rosenthal  PRECAUTIONS: Knee and Fall  SUBJECTIVE:  Pt reports continued 3/10 pain, adding that she has continued to ice and elevate her leg. She reports that some of the self-performed manual techniques can be difficult to perform due to her fibromyalgia pain. She has continued with her HEP daily. PAIN:  Are you having pain? Yes NPRS scale: 3/10 Pain location: medial, lateral, and posterior Lt knee  PAIN TYPE: tightness Pain description: intermittent      OBJECTIVE:   *Unless otherwise noted, objective measures collected previously*   PALPATION: 08/11/21: TTP medial and lateral joint line, TTP distal medial and lateral hamstrings; no palpable tenderness about popliteal fossa or calf.   OBSERVATION: Circumferential  measure at superior pole of patella: Rt: 45cm, Lt: 50cm Post-op bandage in-tact, surgical incision not visible  08/02/21: incision site healing well with no signs of infection   LE AROM/PROM:   A/PROM Right 07/21/2021 Left 07/21/2021 Left 07/27/2021 Left 08/02/21 08/09/21 08/11/21  Knee flexion 115/120p! 56p! 88p! 99 106 118  Knee extension 3/5 -9p! -9p!      (Blank rows = not tested)   LE MMT:   MMT Right 07/21/2021  Left 07/21/2021 Left 08/16/2021  Hip flexion 4/5 3+/5 p!   Hip extension       Hip abduction 4/5 2+/5   Knee flexion 5/5 2+/5p! 4+/5  Knee extension 5/5 2+/5p! 4+/5 with minor p!   (Blank rows = not tested)     FUNCTIONAL TESTS:  5 times sit to stand: 14 seconds    08/16/2021: 10 seconds GAIT: Distance walked: 20 ft Assistive device utilized: Walker - 2 wheeled Level of assistance: Modified independence Comments: Pt ambulates with slow gait speed and antalgic gait. Decreased Lt knee extension in stance and terminal swing.       TODAY'S TREATMENT:  [facility] Adult PT Treatment:                                                DATE: 08/16/2021 Therapeutic Exercise: 6-inch lateral step-up/ down 3x10 BIL Mini squat/ heel raise alternating superset 3x10 each Marching bridges 3x10  Prone hamstring curls with 2.5# ankle weights 3x10 Prone legs hangs with 2.5# ankle weights 3x30sec Seated LAQ with 2.5# ankle weights 2x10 BIL Manual Therapy: Lt knee PROM flexion and extension to tolerance Knee flexion contract/ co-contract MET x6 with 5-sec contractions Neuromuscular re-ed: N/A Therapeutic Activity: N/A Modalities: N/A Self Care: N/A   [facility] Adult PT Treatment:                                                DATE: 08/11/21 Therapeutic Exercise: LAQ 2 x 10; LLE TKE 2 x 10; LLE green band Seated hamstring curl green band 2 x 10 LLE  Hip bridge 2 x 10 Lt supine hip flexor stretch 60 seconds Sidelying hip abduction 2 x 10 LLE  Manual Therapy: Lt knee PROM flexion and extension to tolerance   Self Care: Adjusted SPC to appropriate height  Modalities for pain control    [facility] Adult PT Treatment:                                                DATE: 08/09/21 Therapeutic Exercise: Nustep level 4 x 5 minutes UE/LE focusing on knee ROM Prone quad stretch 10 x 10 seconds LLE SLR 2 x 8 LLE Calf stretch on wedge 30 sec  Manual Therapy: Patellar mobilizations all planes grade II-III Lt  knee PROM flexion and extension to tolerance STM/trigger point release Lt distal quadriceps, IT band, adductors     PATIENT EDUCATION:  Education details: see treatment  Person educated: patient Education method: instruction  Education comprehension: verbalized understanding      HOME EXERCISE PROGRAM: Access Code: [email or web address] URL: [email or web address] Date: 07/22/2021 Prepared by: Vanessa Arb[place]ale  Exercises Heel slide with strap, LAYING ON BACK - 2 x daily - 7 x weekly - 3 sets - 10 reps - 5-sec hold Long Sitting 4 Way Patellar Glide - 2 x daily - 7 x weekly - 3 sets - 10 reps Straight leg raise while pulling leg away from body (abduction) - 2 x daily - 7 x weekly - 3 sets - 10 reps  Added 07/27/2021: Seated Hamstring Stretch - 2 x daily - 7 x weekly - 2 sets - 1-min hold Step Up - 2 x daily - 7 x weekly - 3 sets - 10 reps     ASSESSMENT:   CLINICAL IMPRESSION: Upon re-assessment, the pt has made excellent progress in Lt knee MMT and 5xSTS. She is becoming more functionally capable as indidcated by her ability to accomplish 6-inch lateral step-ups with no difficulty today. She will continue to benefit from skilled PT to address her primary impairments and return to her prior level of function with less limitation.   REHAB POTENTIAL: Excellent   CLINICAL DECISION MAKING: Stable/uncomplicated   EVALUATION COMPLEXITY: Low     GOALS: Goals reviewed with patient? Yes   SHORT TERM GOALS:   STG Name Target Date Goal status  1 Pt will report understanding and adherence to her HEP in order to promote independence in the management of her primary impairments. Baseline: HEP provided at eval. 08/19/2021 achieved  2 Pt will achieve Lt knee flexion AROM of 100 degrees in order to promote WNL gait. Baseline: 56 degrees 08/19/2021 Achieved     LONG TERM GOALS:    LTG Name Target Date Goal status  1 Pt will achieve Lt knee flexion AROM of 120 degrees in order to put  on her shoes and socks with less limitation. Baseline: 56 degrees 08/11/2021: 118 degrees 09/16/2021 PROGRESSING  2 Pt will report ability to walk >30 minutes without an AD and with 0-2/10 pain in order to grocery shop with less limitation. Baseline: >5/10 pain after 3-4 minutes of walking 08/16/2021: 3/10 pain with 15-20 minutes of standing 09/16/2021 PROGRESSING  3 Pt will achieve Lt knee flexion and extension MMT of 4+/5 in order to perform squats when doing yard work with less limitation. Baseline: 2+/5 08/16/2021: 4+/5 09/16/2021 ACHIEVED  4 Pt will report ability to sit >30 minutes with 0-2/10 pain in order to ride or drive in her car with less limitation. Baseline: Unable to sit >15 minutes without >5/10 pain 08/16/2021: Able to sit any amount of time with 3/10 pain 09/16/2021 PROGRESSING    PLAN: PT FREQUENCY: 2x/week   PT DURATION: 8 weeks   PLANNED INTERVENTIONS: Therapeutic exercises, Therapeutic activity, Neuro Muscular re-education, Balance training, Gait training, Patient/Family education, Joint mobilization, Stair training, Dry Needling, Cryotherapy, Manual lymph drainage, scar mobilization, Taping, and Manual therapy   PLAN FOR NEXT SESSION: Progress knee flexion AROM, early quad/ hip strengthening;gait training; update HEP   Vanessa Ross, PT, DPT 08/16/21 11:25 AM

## 2021-08-16 ENCOUNTER — Ambulatory Visit: Payer: Medicaid Other

## 2021-08-16 ENCOUNTER — Other Ambulatory Visit: Payer: Self-pay

## 2021-08-16 DIAGNOSIS — R262 Difficulty in walking, not elsewhere classified: Secondary | ICD-10-CM | POA: Diagnosis not present

## 2021-08-16 DIAGNOSIS — G8929 Other chronic pain: Secondary | ICD-10-CM

## 2021-08-16 DIAGNOSIS — M25562 Pain in left knee: Secondary | ICD-10-CM | POA: Diagnosis not present

## 2021-08-16 DIAGNOSIS — M25661 Stiffness of right knee, not elsewhere classified: Secondary | ICD-10-CM | POA: Diagnosis not present

## 2021-08-16 DIAGNOSIS — M6281 Muscle weakness (generalized): Secondary | ICD-10-CM | POA: Diagnosis not present

## 2021-08-17 NOTE — Therapy (Signed)
OUTPATIENT PHYSICAL THERAPY TREATMENT NOTE   Patient Name: Denise Morales MRN: [id number] DOB:06-25-1961, 61 y.o., female Today's Date: 08/18/2021  PCP: Everardo Beals, NP REFERRING PROVIDER: Mcarthur Rossetti*   PT End of Session - 08/18/21 1047     Visit Number 8    Number of Visits 17    Date for PT Re-Evaluation 09/16/21    Authorization Type [place] [employer]    Authorization Time Period 2/1-09/24/21    Authorization - Visit Number 7    Authorization - Number of Visits 16    PT Start Time 5916    PT Stop Time 1135   10 minutes cryotherapy   PT Time Calculation (min) 50 min    Activity Tolerance Patient tolerated treatment well    Behavior During Therapy WFL for tasks assessed/performed                   Past Medical History:  Diagnosis Date   Anxiety    Arthritis    Depression    Fibromyalgia    GERD (gastroesophageal reflux disease)    Hyperlipidemia    Hypertension    Hypothyroidism    Melanoma ([place])    face   Thyroid disease    Past Surgical History:  Procedure Laterality Date   ABDOMINAL HYSTERECTOMY  1985   CHOLECYSTECTOMY     COLONOSCOPY     SHOULDER ARTHROSCOPY WITH ROTATOR CUFF REPAIR AND SUBACROMIAL DECOMPRESSION Right 08/12/2020   Procedure: RIGHT SHOULDER ARTHROSCOPY WITH ROTATOR CUFF REPAIR AND SUBACROMIAL DECOMPRESSION;  Surgeon: Mcarthur Rossetti, MD;  Location: [facility];  Service: Orthopedics;  Laterality: Right;   TOTAL KNEE ARTHROPLASTY Right 12/24/2020   Procedure: RIGHT TOTAL KNEE ARTHROPLASTY;  Surgeon: Mcarthur Rossetti, MD;  Location: [facility];  Service: Orthopedics;  Laterality: Right;   TOTAL KNEE ARTHROPLASTY Left 07/15/2021   Procedure: LEFT TOTAL KNEE ARTHROPLASTY;  Surgeon: Mcarthur Rossetti, MD;  Location: [facility];  Service: Orthopedics;  Laterality: Left;   Patient Active Problem List   Diagnosis Date Noted   Status post total left knee replacement 07/15/2021   Neoplasm of  uncertain behavior of skin 05/25/2021   Generalized osteoarthritis of multiple sites 05/25/2021   Fibromyalgia syndrome 05/25/2021   Restless legs 03/16/2021   Hyperlipidemia 03/16/2021   Anxiety 03/16/2021   Status post right knee replacement 12/24/2020   Unilateral primary osteoarthritis, left knee 11/11/2020   Unilateral primary osteoarthritis, right knee 11/11/2020   Complete tear of right rotator cuff 08/12/2020   Hypertensive disorder 05/05/2019    REFERRING DIAG: B84.665 (ICD-10-CM) - Status post left knee replacement  THERAPY DIAG:  Chronic pain of left knee  Muscle weakness (generalized)  Difficulty in walking, not elsewhere classified  PERTINENT HISTORY: Lt TKA on 07/15/2021 with Dr. Jean Rosenthal  PRECAUTIONS: Knee and Fall  SUBJECTIVE:  Pt reports that yesterday was the best day she has had since her surgery, adding that she was able to walk and go up a few steps without an AD. She ambulates without an AD today, as well.   PAIN:  Are you having pain? Yes NPRS scale: 1/10 Pain location: medial, lateral, and posterior Lt knee  PAIN TYPE: tightness Pain description: intermittent      OBJECTIVE:   *Unless otherwise noted, objective measures collected previously*   PALPATION: 08/11/21: TTP medial and lateral joint line, TTP distal medial and lateral hamstrings; no palpable tenderness about popliteal fossa or calf.   OBSERVATION: Circumferential measure at superior pole of patella:  Rt: 45cm, Lt: 50cm Post-op bandage in-tact, surgical incision not visible  08/02/21: incision site healing well with no signs of infection   LE AROM/PROM:   A/PROM Right 07/21/2021 Left 07/21/2021 Left 07/27/2021 Left 08/02/21 08/09/21 08/11/21  Knee flexion 115/120p! 56p! 88p! 99 106 118  Knee extension 3/5 -9p! -9p!      (Blank rows = not tested)   LE MMT:   MMT Right 07/21/2021 Left 07/21/2021 Left 08/16/2021  Hip flexion 4/5 3+/5 p!   Hip extension       Hip abduction  4/5 2+/5   Knee flexion 5/5 2+/5p! 4+/5  Knee extension 5/5 2+/5p! 4+/5 with minor p!   (Blank rows = not tested)     FUNCTIONAL TESTS:  5 times sit to stand: 14 seconds    08/16/2021: 10 seconds GAIT: Distance walked: 20 ft Assistive device utilized: Walker - 2 wheeled Level of assistance: Modified independence Comments: Pt ambulates with slow gait speed and antalgic gait. Decreased Lt knee extension in stance and terminal swing.       TODAY'S TREATMENT:  [facility] Adult PT Treatment:                                                DATE: 08/18/2021 Therapeutic Exercise: NuStep level 6 x5 minutes while collecting subjective information 6-inch lateral step-ups 2x10 BIL Mini squat/ heel raise alternating superset 3x10 each Prone hamstring curls with 3# ankle weights 3x10 Prone legs hangs with 3# ankle weights 3x30sec Seated LAQ with 3# ankle weights 3x10 with 3-sec hold BIL Manual Therapy: N/A Neuromuscular re-ed: N/A Therapeutic Activity: N/A Modalities: Cryotherapy to Lt knee x10 minutes with pt in supine with leg elevated, no adverse effect noted Self Care: N/A   [facility] Adult PT Treatment:                                                DATE: 08/16/2021 Therapeutic Exercise: 6-inch lateral step-up/ down 3x10 BIL Mini squat/ heel raise alternating superset 3x10 each Marching bridges 3x10  Prone hamstring curls with 2.5# ankle weights 3x10 Prone legs hangs with 2.5# ankle weights 3x30sec Seated LAQ with 2.5# ankle weights 2x10 BIL Manual Therapy: Lt knee PROM flexion and extension to tolerance Knee flexion contract/ co-contract MET x6 with 5-sec contractions Neuromuscular re-ed: N/A Therapeutic Activity: N/A Modalities: N/A Self Care: N/A   [facility] Adult PT Treatment:                                                DATE: 08/11/21 Therapeutic Exercise: LAQ 2 x 10; LLE TKE 2 x 10; LLE green band Seated hamstring curl green band 2 x 10 LLE  Hip bridge 2 x 10 Lt supine hip  flexor stretch 60 seconds Sidelying hip abduction 2 x 10 LLE  Manual Therapy: Lt knee PROM flexion and extension to tolerance   Self Care: Adjusted SPC to appropriate height  Modalities for pain control       PATIENT EDUCATION:  Education details: see treatment  Person educated: patient Education method: instruction  Education comprehension: verbalized understanding  HOME EXERCISE PROGRAM: Access Code: [email or web address] URL: [email or web address] Date: 07/22/2021 Prepared by: Vanessa Hamlin   Exercises Heel slide with strap, LAYING ON BACK - 2 x daily - 7 x weekly - 3 sets - 10 reps - 5-sec hold Long Sitting 4 Way Patellar Glide - 2 x daily - 7 x weekly - 3 sets - 10 reps Straight leg raise while pulling leg away from body (abduction) - 2 x daily - 7 x weekly - 3 sets - 10 reps  Added 07/27/2021: Seated Hamstring Stretch - 2 x daily - 7 x weekly - 2 sets - 1-min hold Step Up - 2 x daily - 7 x weekly - 3 sets - 10 reps     ASSESSMENT:   CLINICAL IMPRESSION: Pt responded well to all interventions today, demonstrating good form with all selected exercises. However, she reports increased pain when attempting forward step-ups today. As a result, these were deferred to a later date. She was able to progress other exercises today without limitation. However, due to increased inflammation following treatment, the pt received cryotherapy to end the session, which helped to return the pt's pain to baseline. She will continue to benefit from skilled PT to address her primary impairments and return to her prior level of function with less limitation.   REHAB POTENTIAL: Excellent   CLINICAL DECISION MAKING: Stable/uncomplicated   EVALUATION COMPLEXITY: Low     GOALS: Goals reviewed with patient? Yes   SHORT TERM GOALS:   STG Name Target Date Goal status  1 Pt will report understanding and adherence to her HEP in order to promote independence in the management of her  primary impairments. Baseline: HEP provided at eval. 08/19/2021 achieved  2 Pt will achieve Lt knee flexion AROM of 100 degrees in order to promote WNL gait. Baseline: 56 degrees 08/19/2021 Achieved     LONG TERM GOALS:    LTG Name Target Date Goal status  1 Pt will achieve Lt knee flexion AROM of 120 degrees in order to put on her shoes and socks with less limitation. Baseline: 56 degrees 08/11/2021: 118 degrees 09/16/2021 PROGRESSING  2 Pt will report ability to walk >30 minutes without an AD and with 0-2/10 pain in order to grocery shop with less limitation. Baseline: >5/10 pain after 3-4 minutes of walking 08/16/2021: 3/10 pain with 15-20 minutes of standing 09/16/2021 PROGRESSING  3 Pt will achieve Lt knee flexion and extension MMT of 4+/5 in order to perform squats when doing yard work with less limitation. Baseline: 2+/5 08/16/2021: 4+/5 09/16/2021 ACHIEVED  4 Pt will report ability to sit >30 minutes with 0-2/10 pain in order to ride or drive in her car with less limitation. Baseline: Unable to sit >15 minutes without >5/10 pain 08/16/2021: Able to sit any amount of time with 3/10 pain 09/16/2021 PROGRESSING    PLAN: PT FREQUENCY: 2x/week   PT DURATION: 8 weeks   PLANNED INTERVENTIONS: Therapeutic exercises, Therapeutic activity, Neuro Muscular re-education, Balance training, Gait training, Patient/Family education, Joint mobilization, Stair training, Dry Needling, Cryotherapy, Manual lymph drainage, scar mobilization, Taping, and Manual therapy   PLAN FOR NEXT SESSION: Progress knee flexion AROM, early quad/ hip strengthening;gait training; update HEP   Vanessa [place], PT, DPT 08/18/21 11:34 AM

## 2021-08-18 ENCOUNTER — Ambulatory Visit: Payer: Medicaid Other

## 2021-08-18 ENCOUNTER — Other Ambulatory Visit: Payer: Self-pay

## 2021-08-18 DIAGNOSIS — M6281 Muscle weakness (generalized): Secondary | ICD-10-CM | POA: Diagnosis not present

## 2021-08-18 DIAGNOSIS — G8929 Other chronic pain: Secondary | ICD-10-CM

## 2021-08-18 DIAGNOSIS — R262 Difficulty in walking, not elsewhere classified: Secondary | ICD-10-CM | POA: Diagnosis not present

## 2021-08-18 DIAGNOSIS — M25661 Stiffness of right knee, not elsewhere classified: Secondary | ICD-10-CM | POA: Diagnosis not present

## 2021-08-18 DIAGNOSIS — M25562 Pain in left knee: Secondary | ICD-10-CM | POA: Diagnosis not present

## 2021-08-22 NOTE — Therapy (Incomplete)
OUTPATIENT PHYSICAL THERAPY TREATMENT NOTE   Patient Name: Syriah Delisi MRN: [id number] DOB:04-29-1961, 61 y.o., female Today's Date: 08/22/2021  PCP: Everardo Beals, NP REFERRING PROVIDER: Everardo Beals, NP           Past Medical History:  Diagnosis Date   Anxiety    Arthritis    Depression    Fibromyalgia    GERD (gastroesophageal reflux disease)    Hyperlipidemia    Hypertension    Hypothyroidism    Melanoma ([place])    face   Thyroid disease    Past Surgical History:  Procedure Laterality Date   ABDOMINAL HYSTERECTOMY  1985   CHOLECYSTECTOMY     COLONOSCOPY     SHOULDER ARTHROSCOPY WITH ROTATOR CUFF REPAIR AND SUBACROMIAL DECOMPRESSION Right 08/12/2020   Procedure: RIGHT SHOULDER ARTHROSCOPY WITH ROTATOR CUFF REPAIR AND SUBACROMIAL DECOMPRESSION;  Surgeon: Mcarthur Rossetti, MD;  Location: [facility];  Service: Orthopedics;  Laterality: Right;   TOTAL KNEE ARTHROPLASTY Right 12/24/2020   Procedure: RIGHT TOTAL KNEE ARTHROPLASTY;  Surgeon: Mcarthur Rossetti, MD;  Location: [facility];  Service: Orthopedics;  Laterality: Right;   TOTAL KNEE ARTHROPLASTY Left 07/15/2021   Procedure: LEFT TOTAL KNEE ARTHROPLASTY;  Surgeon: Mcarthur Rossetti, MD;  Location: [facility];  Service: Orthopedics;  Laterality: Left;   Patient Active Problem List   Diagnosis Date Noted   Status post total left knee replacement 07/15/2021   Neoplasm of uncertain behavior of skin 05/25/2021   Generalized osteoarthritis of multiple sites 05/25/2021   Fibromyalgia syndrome 05/25/2021   Restless legs 03/16/2021   Hyperlipidemia 03/16/2021   Anxiety 03/16/2021   Status post right knee replacement 12/24/2020   Unilateral primary osteoarthritis, left knee 11/11/2020   Unilateral primary osteoarthritis, right knee 11/11/2020   Complete tear of right rotator cuff 08/12/2020   Hypertensive disorder 05/05/2019    REFERRING DIAG: Z12.458 (ICD-10-CM) - Status  post left knee replacement  THERAPY DIAG:  No diagnosis found.  PERTINENT HISTORY: Lt TKA on 07/15/2021 with Dr. Jean Rosenthal  PRECAUTIONS: Knee and Fall  SUBJECTIVE:    PAIN:  Are you having pain? Yes NPRS scale: 1/10 Pain location: medial, lateral, and posterior Lt knee  PAIN TYPE: tightness Pain description: intermittent      OBJECTIVE:   *Unless otherwise noted, objective measures collected previously*   PALPATION: 08/11/21: TTP medial and lateral joint line, TTP distal medial and lateral hamstrings; no palpable tenderness about popliteal fossa or calf.   OBSERVATION: Circumferential measure at superior pole of patella: Rt: 45cm, Lt: 50cm Post-op bandage in-tact, surgical incision not visible  08/02/21: incision site healing well with no signs of infection   LE AROM/PROM:   A/PROM Right 07/21/2021 Left 07/21/2021 Left 07/27/2021 Left 08/02/21 08/09/21 08/11/21  Knee flexion 115/120p! 56p! 88p! 99 106 118  Knee extension 3/5 -9p! -9p!      (Blank rows = not tested)   LE MMT:   MMT Right 07/21/2021 Left 07/21/2021 Left 08/16/2021  Hip flexion 4/5 3+/5 p!   Hip extension       Hip abduction 4/5 2+/5   Knee flexion 5/5 2+/5p! 4+/5  Knee extension 5/5 2+/5p! 4+/5 with minor p!   (Blank rows = not tested)     FUNCTIONAL TESTS:  5 times sit to stand: 14 seconds    08/16/2021: 10 seconds GAIT: Distance walked: 20 ft Assistive device utilized: Walker - 2 wheeled Level of assistance: Modified independence Comments: Pt ambulates with slow gait speed and antalgic gait. Decreased  Lt knee extension in stance and terminal swing.       TODAY'S TREATMENT: [facility] Adult PT Treatment:                                                DATE: 08/23/21 Therapeutic Exercise: *** Manual Therapy: *** Neuromuscular re-ed: *** Therapeutic Activity: *** Modalities: *** Self Care: ***  [facility] Adult PT Treatment:                                                DATE:  08/18/2021 Therapeutic Exercise: NuStep level 6 x5 minutes while collecting subjective information 6-inch lateral step-ups 2x10 BIL Mini squat/ heel raise alternating superset 3x10 each Prone hamstring curls with 3# ankle weights 3x10 Prone legs hangs with 3# ankle weights 3x30sec Seated LAQ with 3# ankle weights 3x10 with 3-sec hold BIL Manual Therapy: N/A Neuromuscular re-ed: N/A Therapeutic Activity: N/A Modalities: Cryotherapy to Lt knee x10 minutes with pt in supine with leg elevated, no adverse effect noted Self Care: N/A   [facility] Adult PT Treatment:                                                DATE: 08/16/2021 Therapeutic Exercise: 6-inch lateral step-up/ down 3x10 BIL Mini squat/ heel raise alternating superset 3x10 each Marching bridges 3x10  Prone hamstring curls with 2.5# ankle weights 3x10 Prone legs hangs with 2.5# ankle weights 3x30sec Seated LAQ with 2.5# ankle weights 2x10 BIL Manual Therapy: Lt knee PROM flexion and extension to tolerance Knee flexion contract/ co-contract MET x6 with 5-sec contractions Neuromuscular re-ed: N/A Therapeutic Activity: N/A Modalities: N/A Self Care: N/A         PATIENT EDUCATION:  Education details: see treatment  Person educated: patient Education method: instruction  Education comprehension: verbalized understanding      HOME EXERCISE PROGRAM: Access Code: [email or web address] URL: [email or web address] Date: 07/22/2021 Prepared by: Vanessa [place]   Exercises Heel slide with strap, LAYING ON BACK - 2 x daily - 7 x weekly - 3 sets - 10 reps - 5-sec hold Long Sitting 4 Way Patellar Glide - 2 x daily - 7 x weekly - 3 sets - 10 reps Straight leg raise while pulling leg away from body (abduction) - 2 x daily - 7 x weekly - 3 sets - 10 reps  Added 07/27/2021: Seated Hamstring Stretch - 2 x daily - 7 x weekly - 2 sets - 1-min hold Step Up - 2 x daily - 7 x weekly - 3 sets - 10 reps     ASSESSMENT:    CLINICAL IMPRESSION:    REHAB POTENTIAL: Excellent   CLINICAL DECISION MAKING: Stable/uncomplicated   EVALUATION COMPLEXITY: Low     GOALS: Goals reviewed with patient? Yes   SHORT TERM GOALS:   STG Name Target Date Goal status  1 Pt will report understanding and adherence to her HEP in order to promote independence in the management of her primary impairments. Baseline: HEP provided at eval. 08/19/2021 achieved  2 Pt will achieve Lt knee flexion AROM of 100 degrees in order to promote  WNL gait. Baseline: 56 degrees 08/19/2021 Achieved     LONG TERM GOALS:    LTG Name Target Date Goal status  1 Pt will achieve Lt knee flexion AROM of 120 degrees in order to put on her shoes and socks with less limitation. Baseline: 56 degrees 08/11/2021: 118 degrees 09/16/2021 PROGRESSING  2 Pt will report ability to walk >30 minutes without an AD and with 0-2/10 pain in order to grocery shop with less limitation. Baseline: >5/10 pain after 3-4 minutes of walking 08/16/2021: 3/10 pain with 15-20 minutes of standing 09/16/2021 PROGRESSING  3 Pt will achieve Lt knee flexion and extension MMT of 4+/5 in order to perform squats when doing yard work with less limitation. Baseline: 2+/5 08/16/2021: 4+/5 09/16/2021 ACHIEVED  4 Pt will report ability to sit >30 minutes with 0-2/10 pain in order to ride or drive in her car with less limitation. Baseline: Unable to sit >15 minutes without >5/10 pain 08/16/2021: Able to sit any amount of time with 3/10 pain 09/16/2021 PROGRESSING    PLAN: PT FREQUENCY: 2x/week   PT DURATION: 8 weeks   PLANNED INTERVENTIONS: Therapeutic exercises, Therapeutic activity, Neuro Muscular re-education, Balance training, Gait training, Patient/Family education, Joint mobilization, Stair training, Dry Needling, Cryotherapy, Manual lymph drainage, scar mobilization, Taping, and Manual therapy   PLAN FOR NEXT SESSION: Progress knee flexion AROM, early quad/ hip strengthening;gait  training; update HEP  Gwendolyn Grant, PT, DPT, ATC 08/22/21 1:01 PM

## 2021-08-23 ENCOUNTER — Other Ambulatory Visit: Payer: Self-pay

## 2021-08-23 ENCOUNTER — Ambulatory Visit: Payer: Medicaid Other

## 2021-08-23 DIAGNOSIS — G8929 Other chronic pain: Secondary | ICD-10-CM | POA: Diagnosis not present

## 2021-08-23 DIAGNOSIS — M25661 Stiffness of right knee, not elsewhere classified: Secondary | ICD-10-CM | POA: Diagnosis not present

## 2021-08-23 DIAGNOSIS — R262 Difficulty in walking, not elsewhere classified: Secondary | ICD-10-CM

## 2021-08-23 DIAGNOSIS — M25562 Pain in left knee: Secondary | ICD-10-CM

## 2021-08-23 DIAGNOSIS — M6281 Muscle weakness (generalized): Secondary | ICD-10-CM | POA: Diagnosis not present

## 2021-08-23 NOTE — Therapy (Signed)
OUTPATIENT PHYSICAL THERAPY TREATMENT NOTE   Patient Name: Denise Morales MRN: [id number] DOB:10-Jul-1960, 61 y.o., female Today's Date: 08/23/2021  PCP: Everardo Beals, NP REFERRING PROVIDER: Everardo Beals, NP   PT End of Session - 08/23/21 1102     Visit Number 9    Number of Visits 17    Date for PT Re-Evaluation 09/16/21    Authorization Type [place] [employer]    Authorization Time Period 2/1-09/24/21    Authorization - Visit Number 8    Authorization - Number of Visits 16    PT Start Time 3888    PT Stop Time 2800    PT Time Calculation (min) 43 min    Activity Tolerance Patient tolerated treatment well    Behavior During Therapy WFL for tasks assessed/performed                    Past Medical History:  Diagnosis Date   Anxiety    Arthritis    Depression    Fibromyalgia    GERD (gastroesophageal reflux disease)    Hyperlipidemia    Hypertension    Hypothyroidism    Melanoma ([place])    face   Thyroid disease    Past Surgical History:  Procedure Laterality Date   ABDOMINAL HYSTERECTOMY  1985   CHOLECYSTECTOMY     COLONOSCOPY     SHOULDER ARTHROSCOPY WITH ROTATOR CUFF REPAIR AND SUBACROMIAL DECOMPRESSION Right 08/12/2020   Procedure: RIGHT SHOULDER ARTHROSCOPY WITH ROTATOR CUFF REPAIR AND SUBACROMIAL DECOMPRESSION;  Surgeon: Mcarthur Rossetti, MD;  Location: [facility];  Service: Orthopedics;  Laterality: Right;   TOTAL KNEE ARTHROPLASTY Right 12/24/2020   Procedure: RIGHT TOTAL KNEE ARTHROPLASTY;  Surgeon: Mcarthur Rossetti, MD;  Location: [facility];  Service: Orthopedics;  Laterality: Right;   TOTAL KNEE ARTHROPLASTY Left 07/15/2021   Procedure: LEFT TOTAL KNEE ARTHROPLASTY;  Surgeon: Mcarthur Rossetti, MD;  Location: [facility];  Service: Orthopedics;  Laterality: Left;   Patient Active Problem List   Diagnosis Date Noted   Status post total left knee replacement 07/15/2021   Neoplasm of uncertain behavior of skin  05/25/2021   Generalized osteoarthritis of multiple sites 05/25/2021   Fibromyalgia syndrome 05/25/2021   Restless legs 03/16/2021   Hyperlipidemia 03/16/2021   Anxiety 03/16/2021   Status post right knee replacement 12/24/2020   Unilateral primary osteoarthritis, left knee 11/11/2020   Unilateral primary osteoarthritis, right knee 11/11/2020   Complete tear of right rotator cuff 08/12/2020   Hypertensive disorder 05/05/2019    REFERRING DIAG: L49.179 (ICD-10-CM) - Status post left knee replacement  THERAPY DIAG:  Chronic pain of left knee  Muscle weakness (generalized)  Difficulty in walking, not elsewhere classified  Stiffness of right knee, not elsewhere classified  PERTINENT HISTORY: Lt TKA on 07/15/2021 with Dr. Jean Rosenthal  PRECAUTIONS: Knee and Fall  SUBJECTIVE:  "I'm doing good today. My pain is very minimal."  PAIN:  Are you having pain? Yes NPRS scale: 1/10 Pain location: medial, lateral, and Lt knee  PAIN TYPE: tightness Pain description: intermittent      OBJECTIVE:   *Unless otherwise noted, objective measures collected previously*   PALPATION: 08/11/21: TTP medial and lateral joint line, TTP distal medial and lateral hamstrings; no palpable tenderness about popliteal fossa or calf.   OBSERVATION: Circumferential measure at superior pole of patella: Rt: 45cm, Lt: 50cm Post-op bandage in-tact, surgical incision not visible  08/02/21: incision site healing well with no signs of infection   LE AROM/PROM:  A/PROM Right 07/21/2021 Left 07/21/2021 Left 07/27/2021 Left 08/02/21 08/09/21 08/11/21  Knee flexion 115/120p! 56p! 88p! 99 106 118  Knee extension 3/5 -9p! -9p!      (Blank rows = not tested)   LE MMT:   MMT Right 07/21/2021 Left 07/21/2021 Left 08/16/2021  Hip flexion 4/5 3+/5 p!   Hip extension       Hip abduction 4/5 2+/5   Knee flexion 5/5 2+/5p! 4+/5  Knee extension 5/5 2+/5p! 4+/5 with minor p!   (Blank rows = not tested)      FUNCTIONAL TESTS:  5 times sit to stand: 14 seconds    08/16/2021: 10 seconds GAIT: Distance walked: 20 ft Assistive device utilized: Walker - 2 wheeled Level of assistance: Modified independence Comments: Pt ambulates with slow gait speed and antalgic gait. Decreased Lt knee extension in stance and terminal swing.       TODAY'S TREATMENT:  [facility] Adult PT Treatment:                                                DATE: 08/23/21 Therapeutic Exercise: NuStep level 6 5 min LE only  Prone hamstring curls 4# 2x10 Prone knee extension hangs 4# x2 min Supine ball roll KTC with green TB 1x10 Seated LAQ 4# ankle weight 3x10 3 sec holds Front step down   Lateral step down  Mini lunge between parallel bars 2x15 with left LE in front and 1x15 with left LE in back  Therapeutic Activity: Front step down 4 inch step 2x15 Lateral step down 4 inch step 2x15 Mini squat 2x15 in parallel bars, able to perform 5 squats without holding onto bars  Modalities: PT offered ice at end of session, but patient stated she would ice at home.  Self Care: Discussed surgical incision care and provided Band-Aid to cover open section of inferior incision.   [facility] Adult PT Treatment:                                                DATE: 08/18/2021 Therapeutic Exercise: NuStep level 6 x5 minutes while collecting subjective information 6-inch lateral step-ups 2x10 BIL Mini squat/ heel raise alternating superset 3x10 each Prone hamstring curls with 3# ankle weights 3x10 Prone legs hangs with 3# ankle weights 3x30sec Seated LAQ with 3# ankle weights 3x10 with 3-sec hold BIL Manual Therapy: N/A Neuromuscular re-ed: N/A Therapeutic Activity: N/A Modalities: Cryotherapy to Lt knee x10 minutes with pt in supine with leg elevated, no adverse effect noted Self Care: N/A   [facility] Adult PT Treatment:                                                DATE: 08/16/2021 Therapeutic Exercise: 6-inch lateral step-up/ down  3x10 BIL Mini squat/ heel raise alternating superset 3x10 each Marching bridges 3x10  Prone hamstring curls with 2.5# ankle weights 3x10 Prone legs hangs with 2.5# ankle weights 3x30sec Seated LAQ with 2.5# ankle weights 2x10 BIL Manual Therapy: Lt knee PROM flexion and extension to tolerance Knee flexion contract/ co-contract MET x6 with 5-sec contractions Neuromuscular re-ed:  N/A Therapeutic Activity: N/A Modalities: N/A Self Care: N/A     PATIENT EDUCATION:  Education details: see treatment  Person educated: patient Education method: instruction  Education comprehension: verbalized understanding      HOME EXERCISE PROGRAM: Access Code: [email or web address] URL: [email or web address] Date: 08/23/2021 Prepared by: Edythe Lynn  Exercises Heel slide with strap, LAYING ON BACK - 2 x daily - 7 x weekly - 3 sets - 10 reps - 5-sec hold Seated Hamstring Stretch - 2 x daily - 7 x weekly - 2 sets - 1-min hold Supine Short Arc Quad - 2 x daily - 7 x weekly - 2 sets - 10 reps Sit to Stand Without Arm Support - 2 x daily - 7 x weekly - 2 sets - 10 reps Heel Raises with Counter Support - 1 x daily - 7 x weekly - 2 sets - 10 reps Stride Stance Weight Shift - 2 x daily - 7 x weekly - 1 sets - 10 reps Side to Side Weight Shift with Counter Support - 2 x daily - 7 x weekly - 1 sets - 10 reps Long Sitting 4 Way Patellar Glide - 2 x daily - 7 x weekly - 1 min hold Forward Step Down - 1 x daily - 7 x weekly - 2 sets - 15 reps Lateral Step Down - 1 x daily - 7 x weekly - 2 sets - 15 reps    ASSESSMENT:   CLINICAL IMPRESSION: Gem tolerated interventions in today's session very well. She arrived to PT with reported concerns of small open incision area on the inferior section of her surgical incision. Discussed it could possibly be an internal suture and provided a larger bandaid to cover area until she sees her doctor later this week on Thursday for check up. She tolerated the  addition of standing exercises in the parallel bars including mini lunges, mini squats, and forward and lateral step downs. She has a "pulling" sensation with weight bearing knee flexion in her anterior thigh when performing mini lunges and steps downs on a 4 inch step.    REHAB POTENTIAL: Excellent   CLINICAL DECISION MAKING: Stable/uncomplicated   EVALUATION COMPLEXITY: Low     GOALS: Goals reviewed with patient? Yes   SHORT TERM GOALS:   STG Name Target Date Goal status  1 Pt will report understanding and adherence to her HEP in order to promote independence in the management of her primary impairments. Baseline: HEP provided at eval. 08/19/2021 achieved  2 Pt will achieve Lt knee flexion AROM of 100 degrees in order to promote WNL gait. Baseline: 56 degrees 08/19/2021 Achieved     LONG TERM GOALS:    LTG Name Target Date Goal status  1 Pt will achieve Lt knee flexion AROM of 120 degrees in order to put on her shoes and socks with less limitation. Baseline: 56 degrees 08/11/2021: 118 degrees 09/16/2021 PROGRESSING  2 Pt will report ability to walk >30 minutes without an AD and with 0-2/10 pain in order to grocery shop with less limitation. Baseline: >5/10 pain after 3-4 minutes of walking 08/16/2021: 3/10 pain with 15-20 minutes of standing 09/16/2021 PROGRESSING  3 Pt will achieve Lt knee flexion and extension MMT of 4+/5 in order to perform squats when doing yard work with less limitation. Baseline: 2+/5 08/16/2021: 4+/5 09/16/2021 ACHIEVED  4 Pt will report ability to sit >30 minutes with 0-2/10 pain in order to ride or drive in her car with less limitation. Baseline: Unable to  sit >15 minutes without >5/10 pain 08/16/2021: Able to sit any amount of time with 3/10 pain 09/16/2021 PROGRESSING    PLAN: PT FREQUENCY: 2x/week   PT DURATION: 8 weeks   PLANNED INTERVENTIONS: Therapeutic exercises, Therapeutic activity, Neuro Muscular re-education, Balance training, Gait training,  Patient/Family education, Joint mobilization, Stair training, Dry Needling, Cryotherapy, Manual lymph drainage, scar mobilization, Taping, and Manual therapy   PLAN FOR NEXT SESSION: Progress knee flexion AROM, early quad/ hip strengthening;gait training; step ups/downs  Edythe Lynn, PT, DPT 08/23/21 11:59 AM

## 2021-08-24 NOTE — Therapy (Signed)
OUTPATIENT PHYSICAL THERAPY TREATMENT NOTE   Patient Name: Denise Morales MRN: [id number] DOB:[birth year], 61 y.o., female Today's Date: 08/25/2021  PCP: Everardo Beals, NP REFERRING PROVIDER: Mcarthur Rossetti*   PT End of Session - 08/25/21 1058     Visit Number 10    Number of Visits 17    Date for PT Re-Evaluation 09/16/21    Authorization Type [place] [employer]    Authorization Time Period 2/1-09/24/21    Authorization - Visit Number 9    Authorization - Number of Visits 16    PT Start Time 1058    PT Stop Time 1141    PT Time Calculation (min) 43 min    Activity Tolerance Patient tolerated treatment well    Behavior During Therapy WFL for tasks assessed/performed                     Past Medical History:  Diagnosis Date   Anxiety    Arthritis    Depression    Fibromyalgia    GERD (gastroesophageal reflux disease)    Hyperlipidemia    Hypertension    Hypothyroidism    Melanoma ([place])    face   Thyroid disease    Past Surgical History:  Procedure Laterality Date   ABDOMINAL HYSTERECTOMY  1985   CHOLECYSTECTOMY     COLONOSCOPY     SHOULDER ARTHROSCOPY WITH ROTATOR CUFF REPAIR AND SUBACROMIAL DECOMPRESSION Right 08/12/2020   Procedure: RIGHT SHOULDER ARTHROSCOPY WITH ROTATOR CUFF REPAIR AND SUBACROMIAL DECOMPRESSION;  Surgeon: Mcarthur Rossetti, MD;  Location: [place];  Service: Orthopedics;  Laterality: Right;   TOTAL KNEE ARTHROPLASTY Right 12/24/2020   Procedure: RIGHT TOTAL KNEE ARTHROPLASTY;  Surgeon: Mcarthur Rossetti, MD;  Location: [facility];  Service: Orthopedics;  Laterality: Right;   TOTAL KNEE ARTHROPLASTY Left 07/15/2021   Procedure: LEFT TOTAL KNEE ARTHROPLASTY;  Surgeon: Mcarthur Rossetti, MD;  Location: [facility];  Service: Orthopedics;  Laterality: Left;   Patient Active Problem List   Diagnosis Date Noted   Status post total left knee replacement 07/15/2021   Neoplasm of uncertain behavior of  skin 05/25/2021   Generalized osteoarthritis of multiple sites 05/25/2021   Fibromyalgia syndrome 05/25/2021   Restless legs 03/16/2021   Hyperlipidemia 03/16/2021   Anxiety 03/16/2021   Status post right knee replacement 12/24/2020   Unilateral primary osteoarthritis, left knee 11/11/2020   Unilateral primary osteoarthritis, right knee 11/11/2020   Complete tear of right rotator cuff 08/12/2020   Hypertensive disorder 05/05/2019    REFERRING DIAG: O59.292 (ICD-10-CM) - Status post left knee replacement  THERAPY DIAG:  Chronic pain of left knee  Muscle weakness (generalized)  Difficulty in walking, not elsewhere classified  PERTINENT HISTORY: Lt TKA on 07/15/2021 with Dr. Jean Rosenthal  PRECAUTIONS: Knee and Fall  SUBJECTIVE:  Patient reports the knee continues to feel better. She has f/u with Dr. [name] later today.   PAIN:  Are you having pain? No NPRS scale: 0/10 NPRS worst scale: 2/10 Pain location: Lt knee  PAIN TYPE: tightness Pain description: intermittent      OBJECTIVE:   *Unless otherwise noted, objective measures collected previously*   PALPATION: 08/11/21: TTP medial and lateral joint line, TTP distal medial and lateral hamstrings; no palpable tenderness about popliteal fossa or calf.   OBSERVATION: Circumferential measure at superior pole of patella: Rt: 45cm, Lt: 50cm Post-op bandage in-tact, surgical incision not visible  08/02/21: incision site healing well with no signs of infection  LE AROM/PROM:   A/PROM Right 07/21/2021 Left 07/21/2021 Left 07/27/2021 Left 08/02/21 08/09/21 08/11/21 08/25/21  Knee flexion 115/120p! 56p! 88p! 99 106 118 120  Knee extension 3/5 -9p! -9p!       (Blank rows = not tested)   LE MMT:   MMT Right 07/21/2021 Left 07/21/2021 Left 08/16/2021  Hip flexion 4/5 3+/5 p!   Hip extension       Hip abduction 4/5 2+/5   Knee flexion 5/5 2+/5p! 4+/5  Knee extension 5/5 2+/5p! 4+/5 with minor p!   (Blank rows = not  tested)     FUNCTIONAL TESTS:  5 times sit to stand: 14 seconds    08/16/2021: 10 seconds GAIT: Distance walked: 20 ft Assistive device utilized: Walker - 2 wheeled Level of assistance: Modified independence Comments: Pt ambulates with slow gait speed and antalgic gait. Decreased Lt knee extension in stance and terminal swing.       TODAY'S TREATMENT: [facility] Adult PT Treatment:                                                DATE: 08/25/21 Therapeutic Exercise: NuStep level 6 x 5 minutes LE/UE Prone quad stretch 3 x 30 sec each  DL leg press 1 x 10 @ 20 lbs  Eccentric leg press 1 x 10 RLE, 1 x 10 LLE @ 20 lbs each  Step up knee driver 2 x 10 bilateral 8 inch step; UE support needed for step up on the LLE  LAQ 2 x 15 @ 4 lbs LLE   [facility] Adult PT Treatment:                                                DATE: 08/23/21 Therapeutic Exercise: NuStep level 6 5 min LE only  Prone hamstring curls 4# 2x10 Prone knee extension hangs 4# x2 min Supine ball roll KTC with green TB 1x10 Seated LAQ 4# ankle weight 3x10 3 sec holds Front step down   Lateral step down  Mini lunge between parallel bars 2x15 with left LE in front and 1x15 with left LE in back  Therapeutic Activity: Front step down 4 inch step 2x15 Lateral step down 4 inch step 2x15 Mini squat 2x15 in parallel bars, able to perform 5 squats without holding onto bars  Modalities: PT offered ice at end of session, but patient stated she would ice at home.  Self Care: Discussed surgical incision care and provided Band-Aid to cover open section of inferior incision.   [facility] Adult PT Treatment:                                                DATE: 08/18/2021 Therapeutic Exercise: NuStep level 6 x5 minutes while collecting subjective information 6-inch lateral step-ups 2x10 BIL Mini squat/ heel raise alternating superset 3x10 each Prone hamstring curls with 3# ankle weights 3x10 Prone legs hangs with 3# ankle weights 3x30sec Seated  LAQ with 3# ankle weights 3x10 with 3-sec hold BIL Manual Therapy: N/A Neuromuscular re-ed: N/A Therapeutic Activity: N/A Modalities: Cryotherapy to Lt knee x10 minutes  with pt in supine with leg elevated, no adverse effect noted Self Care: N/A      PATIENT EDUCATION:  Education details:N/A Person educated: n/a Education method: n/a  Education comprehension: n/a     HOME EXERCISE PROGRAM: Access Code: [email or web address] URL: [email or web address] Date: 08/23/2021 Prepared by: Edythe Lynn  Exercises Heel slide with strap, LAYING ON BACK - 2 x daily - 7 x weekly - 3 sets - 10 reps - 5-sec hold Seated Hamstring Stretch - 2 x daily - 7 x weekly - 2 sets - 1-min hold Supine Short Arc Quad - 2 x daily - 7 x weekly - 2 sets - 10 reps Sit to Stand Without Arm Support - 2 x daily - 7 x weekly - 2 sets - 10 reps Heel Raises with Counter Support - 1 x daily - 7 x weekly - 2 sets - 10 reps Stride Stance Weight Shift - 2 x daily - 7 x weekly - 1 sets - 10 reps Side to Side Weight Shift with Counter Support - 2 x daily - 7 x weekly - 1 sets - 10 reps Long Sitting 4 Way Patellar Glide - 2 x daily - 7 x weekly - 1 min hold Forward Step Down - 1 x daily - 7 x weekly - 2 sets - 15 reps Lateral Step Down - 1 x daily - 7 x weekly - 2 sets - 15 reps    ASSESSMENT:   CLINICAL IMPRESSION: Lt knee flexion AROM continues to improve, achieving 120 degrees today having met this long term functional goal. Continued focused on CKC strengthening, which she tolerated well without reports of pain though quickly fatigues with eccentric quadriceps strengthening on the LLE. With step ups on the LLE she requires single UE support secondary to weakness.    REHAB POTENTIAL: Excellent   CLINICAL DECISION MAKING: Stable/uncomplicated   EVALUATION COMPLEXITY: Low     GOALS: Goals reviewed with patient? Yes   SHORT TERM GOALS:   STG Name Target Date Goal status  1 Pt will report understanding  and adherence to her HEP in order to promote independence in the management of her primary impairments. Baseline: HEP provided at eval. 08/19/2021 achieved  2 Pt will achieve Lt knee flexion AROM of 100 degrees in order to promote WNL gait. Baseline: 56 degrees 08/19/2021 Achieved     LONG TERM GOALS:    LTG Name Target Date Goal status  1 Pt will achieve Lt knee flexion AROM of 120 degrees in order to put on her shoes and socks with less limitation. Baseline: 56 degrees 08/11/2021: 118 degrees 09/16/2021 Achieved   2 Pt will report ability to walk >30 minutes without an AD and with 0-2/10 pain in order to grocery shop with less limitation. Baseline: >5/10 pain after 3-4 minutes of walking 08/16/2021: 3/10 pain with 15-20 minutes of standing 09/16/2021 PROGRESSING  3 Pt will achieve Lt knee flexion and extension MMT of 4+/5 in order to perform squats when doing yard work with less limitation. Baseline: 2+/5 08/16/2021: 4+/5 09/16/2021 ACHIEVED  4 Pt will report ability to sit >30 minutes with 0-2/10 pain in order to ride or drive in her car with less limitation. Baseline: Unable to sit >15 minutes without >5/10 pain 08/16/2021: Able to sit any amount of time with 3/10 pain 09/16/2021 PROGRESSING    PLAN: PT FREQUENCY: 2x/week   PT DURATION: 8 weeks   PLANNED INTERVENTIONS: Therapeutic exercises, Therapeutic activity, Neuro Muscular re-education, Balance training, Gait training, Patient/Family  education, Joint mobilization, Stair training, Dry Needling, Cryotherapy, Manual lymph drainage, scar mobilization, Taping, and Manual therapy   PLAN FOR NEXT SESSION: Progress knee flexion AROM, CKC strengthening    Gwendolyn Grant, PT, DPT, ATC 08/25/21 11:42 AM

## 2021-08-25 ENCOUNTER — Other Ambulatory Visit: Payer: Self-pay

## 2021-08-25 ENCOUNTER — Encounter: Payer: Self-pay | Admitting: Orthopaedic Surgery

## 2021-08-25 ENCOUNTER — Ambulatory Visit (INDEPENDENT_AMBULATORY_CARE_PROVIDER_SITE_OTHER): Payer: Medicaid Other | Admitting: Orthopaedic Surgery

## 2021-08-25 ENCOUNTER — Ambulatory Visit: Payer: Medicaid Other | Attending: Orthopaedic Surgery

## 2021-08-25 DIAGNOSIS — M6281 Muscle weakness (generalized): Secondary | ICD-10-CM

## 2021-08-25 DIAGNOSIS — Z96652 Presence of left artificial knee joint: Secondary | ICD-10-CM

## 2021-08-25 DIAGNOSIS — G8929 Other chronic pain: Secondary | ICD-10-CM | POA: Diagnosis present

## 2021-08-25 DIAGNOSIS — M25562 Pain in left knee: Secondary | ICD-10-CM | POA: Diagnosis not present

## 2021-08-25 DIAGNOSIS — R262 Difficulty in walking, not elsewhere classified: Secondary | ICD-10-CM

## 2021-08-25 NOTE — Progress Notes (Signed)
The patient comes in at 6 weeks status post a left total knee arthroplasty.  She says her range of motion is doing excellent but she does have an area of a retained suture that she has been picking at trying to remove it. ? ?I see the area that she has been dealing with.  I was able to remove a retained Vicryl suture.  There is no evidence of infection but she does have a small wound there.  I would have her clean this daily and then place Bactroban ointment over the wound until it heals. ? ?She will continue outpatient therapy to work on her quad strengthening but overall she is doing excellent.  I will see her back in 4 weeks to see how she doing overall but no x-rays are needed.  If anything worsens before then she knows to let us know. ?

## 2021-08-29 ENCOUNTER — Telehealth: Payer: Self-pay | Admitting: Orthopaedic Surgery

## 2021-08-30 ENCOUNTER — Ambulatory Visit: Payer: Medicaid Other

## 2021-08-31 NOTE — Therapy (Signed)
OUTPATIENT PHYSICAL THERAPY TREATMENT NOTE   Patient Name: Kalia Vahey MRN: [id number] DOB:1960/09/27, 61 y.o., female Today's Date: 09/01/2021  PCP: Everardo Beals, NP REFERRING PROVIDER: Everardo Beals, NP   PT End of Session - 09/01/21 1053     Visit Number 11    Number of Visits 17    Date for PT Re-Evaluation 09/16/21    Authorization Type [place] [employer]    Authorization Time Period 2/1-09/24/21    Authorization - Visit Number 10    Authorization - Number of Visits 16    PT Start Time 5701    PT Stop Time 1130    PT Time Calculation (min) 38 min    Activity Tolerance Patient tolerated treatment well    Behavior During Therapy WFL for tasks assessed/performed                      Past Medical History:  Diagnosis Date   Anxiety    Arthritis    Depression    Fibromyalgia    GERD (gastroesophageal reflux disease)    Hyperlipidemia    Hypertension    Hypothyroidism    Melanoma ([place])    face   Thyroid disease    Past Surgical History:  Procedure Laterality Date   ABDOMINAL HYSTERECTOMY  1985   CHOLECYSTECTOMY     COLONOSCOPY     SHOULDER ARTHROSCOPY WITH ROTATOR CUFF REPAIR AND SUBACROMIAL DECOMPRESSION Right 08/12/2020   Procedure: RIGHT SHOULDER ARTHROSCOPY WITH ROTATOR CUFF REPAIR AND SUBACROMIAL DECOMPRESSION;  Surgeon: Mcarthur Rossetti, MD;  Location: [place];  Service: Orthopedics;  Laterality: Right;   TOTAL KNEE ARTHROPLASTY Right 12/24/2020   Procedure: RIGHT TOTAL KNEE ARTHROPLASTY;  Surgeon: Mcarthur Rossetti, MD;  Location: [facility];  Service: Orthopedics;  Laterality: Right;   TOTAL KNEE ARTHROPLASTY Left 07/15/2021   Procedure: LEFT TOTAL KNEE ARTHROPLASTY;  Surgeon: Mcarthur Rossetti, MD;  Location: [facility];  Service: Orthopedics;  Laterality: Left;   Patient Active Problem List   Diagnosis Date Noted   Status post total left knee replacement 07/15/2021   Neoplasm of uncertain behavior of  skin 05/25/2021   Generalized osteoarthritis of multiple sites 05/25/2021   Fibromyalgia syndrome 05/25/2021   Restless legs 03/16/2021   Hyperlipidemia 03/16/2021   Anxiety 03/16/2021   Status post right knee replacement 12/24/2020   Unilateral primary osteoarthritis, left knee 11/11/2020   Unilateral primary osteoarthritis, right knee 11/11/2020   Complete tear of right rotator cuff 08/12/2020   Hypertensive disorder 05/05/2019    REFERRING DIAG: X79.390 (ICD-10-CM) - Status post left knee replacement  THERAPY DIAG:  Chronic pain of left knee  Muscle weakness (generalized)  Difficulty in walking, not elsewhere classified  PERTINENT HISTORY: Lt TKA on 07/15/2021 with Dr. Jean Rosenthal  PRECAUTIONS: Knee and Fall  SUBJECTIVE:  Pt reports minor knee pain today, adding that she was sick last week and had dental surgery on Tuesday. She reports continued HEP adherence.  PAIN:  Are you having pain? No NPRS scale: 1/10 NPRS worst scale: 2/10 Pain location: Lt knee  PAIN TYPE: tightness Pain description: intermittent      OBJECTIVE:   *Unless otherwise noted, objective measures collected previously*   PALPATION: 08/11/21: TTP medial and lateral joint line, TTP distal medial and lateral hamstrings; no palpable tenderness about popliteal fossa or calf.   OBSERVATION: Circumferential measure at superior pole of patella: Rt: 45cm, Lt: 50cm Post-op bandage in-tact, surgical incision not visible  08/02/21: incision site healing  well with no signs of infection   LE AROM/PROM:   A/PROM Right 07/21/2021 Left 07/21/2021 Left 07/27/2021 Left 08/02/21 08/09/21 08/11/21 08/25/21  Knee flexion 115/120p! 56p! 88p! 99 106 118 120  Knee extension 3/5 -9p! -9p!       (Blank rows = not tested)   LE MMT:   MMT Right 07/21/2021 Left 07/21/2021 Left 08/16/2021 Left 09/01/2021  Hip flexion 4/5 3+/5 p!  3+/5  Hip extension        Hip abduction 4/5 2+/5    Knee flexion 5/5 2+/5p! 4+/5 5/5   Knee extension 5/5 2+/5p! 4+/5 with minor p! 5/5   (Blank rows = not tested)     FUNCTIONAL TESTS:  5 times sit to stand: 14 seconds    08/16/2021: 10 seconds GAIT: Distance walked: 20 ft Assistive device utilized: Walker - 2 wheeled Level of assistance: Modified independence Comments: Pt ambulates with slow gait speed and antalgic gait. Decreased Lt knee extension in stance and terminal swing.       TODAY'S TREATMENT:  [facility] Adult PT Treatment:                                                DATE: 09/01/2021 Therapeutic Exercise: Standing with heel on Airex pad and alternating 5# kettlbell handoffs side-to-side 3x20 Prone hamstring curls with 4# ankle weights 3x10 Prone knee hangs with 4# ankle weights 3x30sec Cybex hip flexion with 25# cable 2x10 BIL Manual Therapy: N/A Neuromuscular re-ed: N/A Therapeutic Activity: Step up onto Airex pad with slow retro step down in // bars 3x10 BIL 8-inch lateral step up/ step down 2x10 BIL Functional squat into heel raise on Airex pad 3x10 Modalities: N/A Self Care: N/A   [facility] Adult PT Treatment:                                                DATE: 08/25/21 Therapeutic Exercise: NuStep level 6 x 5 minutes LE/UE Prone quad stretch 3 x 30 sec each  DL leg press 1 x 10 @ 20 lbs  Eccentric leg press 1 x 10 RLE, 1 x 10 LLE @ 20 lbs each  Step up knee driver 2 x 10 bilateral 8 inch step; UE support needed for step up on the LLE  LAQ 2 x 15 @ 4 lbs LLE   [facility] Adult PT Treatment:                                                DATE: 08/23/21 Therapeutic Exercise: NuStep level 6 5 min LE only  Prone hamstring curls 4# 2x10 Prone knee extension hangs 4# x2 min Supine ball roll KTC with green TB 1x10 Seated LAQ 4# ankle weight 3x10 3 sec holds Front step down   Lateral step down  Mini lunge between parallel bars 2x15 with left LE in front and 1x15 with left LE in back  Therapeutic Activity: Front step down 4 inch step 2x15 Lateral step  down 4 inch step 2x15 Mini squat 2x15 in parallel bars, able to perform 5 squats without holding onto bars  Modalities: PT offered ice at end of session, but patient stated she would ice at home.  Self Care: Discussed surgical incision care and provided Band-Aid to cover open section of inferior incision.       PATIENT EDUCATION:  Education details:N/A Person educated: n/a Education method: n/a  Education comprehension: n/a     HOME EXERCISE PROGRAM: Access Code: [email or web address] URL: [email or web address] Date: 08/23/2021 Prepared by: Edythe Lynn  Exercises Heel slide with strap, LAYING ON BACK - 2 x daily - 7 x weekly - 3 sets - 10 reps - 5-sec hold Seated Hamstring Stretch - 2 x daily - 7 x weekly - 2 sets - 1-min hold Supine Short Arc Quad - 2 x daily - 7 x weekly - 2 sets - 10 reps Sit to Stand Without Arm Support - 2 x daily - 7 x weekly - 2 sets - 10 reps Heel Raises with Counter Support - 1 x daily - 7 x weekly - 2 sets - 10 reps Stride Stance Weight Shift - 2 x daily - 7 x weekly - 1 sets - 10 reps Side to Side Weight Shift with Counter Support - 2 x daily - 7 x weekly - 1 sets - 10 reps Long Sitting 4 Way Patellar Glide - 2 x daily - 7 x weekly - 1 min hold Forward Step Down - 1 x daily - 7 x weekly - 2 sets - 15 reps Lateral Step Down - 1 x daily - 7 x weekly - 2 sets - 15 reps    ASSESSMENT:   CLINICAL IMPRESSION: Pt responded well to all interventions today, demonstrating good form and no increase in pain with selected exercises. Of note, upon re-assessment of LE strength, the patient has made continued improvements. She will continue to benefit from skilled PT to address her primary impairments and return to her prior level of function with less limitation.   REHAB POTENTIAL: Excellent   CLINICAL DECISION MAKING: Stable/uncomplicated   EVALUATION COMPLEXITY: Low     GOALS: Goals reviewed with patient? Yes   SHORT TERM GOALS:   STG Name  Target Date Goal status  1 Pt will report understanding and adherence to her HEP in order to promote independence in the management of her primary impairments. Baseline: HEP provided at eval. 08/19/2021 achieved  2 Pt will achieve Lt knee flexion AROM of 100 degrees in order to promote WNL gait. Baseline: 56 degrees 08/19/2021 Achieved     LONG TERM GOALS:    LTG Name Target Date Goal status  1 Pt will achieve Lt knee flexion AROM of 120 degrees in order to put on her shoes and socks with less limitation. Baseline: 56 degrees 08/11/2021: 118 degrees 09/16/2021 Achieved   2 Pt will report ability to walk >30 minutes without an AD and with 0-2/10 pain in order to grocery shop with less limitation. Baseline: >5/10 pain after 3-4 minutes of walking 08/16/2021: 3/10 pain with 15-20 minutes of standing 09/16/2021 PROGRESSING  3 Pt will achieve Lt knee flexion and extension MMT of 4+/5 in order to perform squats when doing yard work with less limitation. Baseline: 2+/5 08/16/2021: 4+/5 09/16/2021 ACHIEVED  4 Pt will report ability to sit >30 minutes with 0-2/10 pain in order to ride or drive in her car with less limitation. Baseline: Unable to sit >15 minutes without >5/10 pain 08/16/2021: Able to sit any amount of time with 3/10 pain 09/16/2021 PROGRESSING    PLAN: PT FREQUENCY: 2x/week   PT  DURATION: 8 weeks   PLANNED INTERVENTIONS: Therapeutic exercises, Therapeutic activity, Neuro Muscular re-education, Balance training, Gait training, Patient/Family education, Joint mobilization, Stair training, Dry Needling, Cryotherapy, Manual lymph drainage, scar mobilization, Taping, and Manual therapy   PLAN FOR NEXT SESSION: Progress knee flexion AROM, CKC strengthening    [employer], PT, DPT 09/01/21 11:30 AM

## 2021-09-01 ENCOUNTER — Ambulatory Visit: Payer: Medicaid Other

## 2021-09-01 ENCOUNTER — Other Ambulatory Visit: Payer: Self-pay

## 2021-09-01 DIAGNOSIS — M6281 Muscle weakness (generalized): Secondary | ICD-10-CM

## 2021-09-01 DIAGNOSIS — M25562 Pain in left knee: Secondary | ICD-10-CM | POA: Diagnosis not present

## 2021-09-01 DIAGNOSIS — R262 Difficulty in walking, not elsewhere classified: Secondary | ICD-10-CM

## 2021-09-06 ENCOUNTER — Ambulatory Visit: Payer: Medicaid Other

## 2021-09-07 NOTE — Therapy (Signed)
?OUTPATIENT PHYSICAL THERAPY TREATMENT NOTE ? ? ?Patient Name: Denise Morales ?MRN: [id number] ?DOB:Mar 09, 1961, 61 y.o., female ?Today's Date: 09/08/2021 ? ?PCP: Everardo Beals, NP ?REFERRING PROVIDER: Mcarthur Rossetti* ? ? PT End of Session - 09/08/21 1052   ? ? Visit Number 12   ? Number of Visits 17   ? Date for PT Re-Evaluation 09/16/21   ? Authorization Type [facility] [facility] [employer]   ? Authorization Time Period 2/1-09/24/21   ? Authorization - Visit Number 11   ? Authorization - Number of Visits 16   ? PT Start Time 1045   ? PT Stop Time 1130   ? PT Time Calculation (min) 45 min   ? Activity Tolerance Patient tolerated treatment well   ? Behavior During Therapy [facility] for tasks assessed/performed   ? ?  ?  ? ?  ? ? ? ? ? ? ? ? ? ? ? ? ?Past Medical History:  ?Diagnosis Date  ? Anxiety   ? Arthritis   ? Depression   ? Fibromyalgia   ? GERD (gastroesophageal reflux disease)   ? Hyperlipidemia   ? Hypertension   ? Hypothyroidism   ? Melanoma ([place])   ? face  ? Thyroid disease   ? ?Past Surgical History:  ?Procedure Laterality Date  ? ABDOMINAL HYSTERECTOMY  1985  ? CHOLECYSTECTOMY    ? COLONOSCOPY    ? SHOULDER ARTHROSCOPY WITH ROTATOR CUFF REPAIR AND SUBACROMIAL DECOMPRESSION Right 08/12/2020  ? Procedure: RIGHT SHOULDER ARTHROSCOPY WITH ROTATOR CUFF REPAIR AND SUBACROMIAL DECOMPRESSION;  Surgeon: Mcarthur Rossetti, MD;  Location: [facility];  Service: Orthopedics;  Laterality: Right;  ? TOTAL KNEE ARTHROPLASTY Right 12/24/2020  ? Procedure: RIGHT TOTAL KNEE ARTHROPLASTY;  Surgeon: Mcarthur Rossetti, MD;  Location: [facility];  Service: Orthopedics;  Laterality: Right;  ? TOTAL KNEE ARTHROPLASTY Left 07/15/2021  ? Procedure: LEFT TOTAL KNEE ARTHROPLASTY;  Surgeon: Mcarthur Rossetti, MD;  Location: [facility];  Service: Orthopedics;  Laterality: Left;  ? ?Patient Active Problem List  ? Diagnosis Date Noted  ? Status post total left knee replacement 07/15/2021  ? Neoplasm of uncertain behavior  of skin 05/25/2021  ? Generalized osteoarthritis of multiple sites 05/25/2021  ? Fibromyalgia syndrome 05/25/2021  ? Restless legs 03/16/2021  ? Hyperlipidemia 03/16/2021  ? Anxiety 03/16/2021  ? Status post right knee replacement 12/24/2020  ? Unilateral primary osteoarthritis, left knee 11/11/2020  ? Unilateral primary osteoarthritis, right knee 11/11/2020  ? Complete tear of right rotator cuff 08/12/2020  ? Hypertensive disorder 05/05/2019  ? ? ?REFERRING DIAG: [id number] (ICD-10-CM) - Status post left knee replacement ? ?THERAPY DIAG:  ?Chronic pain of left knee ? ?Muscle weakness (generalized) ? ?Difficulty in walking, not elsewhere classified ? ?PERTINENT HISTORY: Lt TKA on 07/15/2021 with Dr. Jean Rosenthal ? ?PRECAUTIONS: Knee and Fall ? ?SUBJECTIVE:  ?Pt reports she is feeling much better today, adding that she has no knee pain today. ? ?PAIN:  ?Are you having pain? No ?NPRS scale: 0/10 ?NPRS worst scale: 2/10 ?Pain location: Lt knee  ?PAIN TYPE: tightness ?Pain description: intermittent  ? ? ? ? ?OBJECTIVE:  ? *Unless otherwise noted, objective measures collected previously* ?  ?PALPATION: 08/11/21: TTP medial and lateral joint line, TTP distal medial and lateral hamstrings; no palpable tenderness about popliteal fossa or calf.  ? ?OBSERVATION: ?Circumferential measure at superior pole of patella: Rt: 45cm, Lt: 50cm ?Post-op bandage in-tact, surgical incision not visible ? ?08/02/21: incision site healing well with no signs of infection ?  ?  LE AROM/PROM: ?  ?A/PROM Right ?07/21/2021 Left ?07/21/2021 Left ?07/27/2021 Left 08/02/21 08/09/21 08/11/21 08/25/21  ?Knee flexion 115/120p! 56p! 88p! 99 106 118 120  ?Knee extension 3/5 -9p! -9p!      ? (Blank rows = not tested) ?  ?LE MMT: ?  ?MMT Right ?07/21/2021 Left ?07/21/2021 Left ?08/16/2021 Left ?09/01/2021  ?Hip flexion 4/5 3+/5 p!  3+/5  ?Hip extension        ?Hip abduction 4/5 2+/5    ?Knee flexion 5/5 2+/5p! 4+/5 5/5  ?Knee extension 5/5 2+/5p! 4+/5 with minor p! 5/5  ?  (Blank rows = not tested) ?  ?  ?FUNCTIONAL TESTS:  ?5 times sit to stand: 14 seconds ?   08/16/2021: 10 seconds ?GAIT: ?Distance walked: 20 ft ?Assistive device utilized: Environmental consultant - 2 wheeled ?Level of assistance: Modified independence ?Comments: Pt ambulates with slow gait speed and antalgic gait. Decreased Lt knee extension in stance and terminal swing. ?  ?  ?  ?TODAY'S TREATMENT: ? ?[place] Adult PT Treatment:                                                DATE: 09/08/2021 ?Therapeutic Exercise: ?Stationary bike level 3 x5 minutes while collecting subjective information ?SL dead lifts with 5# kettlebell and UE support 3x8 BIL ?Cybex leg press with 40# 3x10 ?Squat false hops onto toes 3x8 ?Standing IT band stretch with overhead reach x1 minute BIL ?Prone hamstring curls with 4# ankle weights 3x10 with slow lowering ?Prone knee hangs with 4# ankle weight 2x30sec in-between hamstring curl sets ?Forward walkouts with retro return with thigh strap and 7# cable at [employer] x5 walkouts BIL ?Manual Therapy: ?N/A ?Neuromuscular re-ed: ?N/A ?Therapeutic Activity: ?N/A ?Modalities: ?N/A ?Self Care: ?N/A ? ? ?[place] Adult PT Treatment:                                                DATE: 09/01/2021 ?Therapeutic Exercise: ?Standing with heel on Airex pad and alternating 5# kettlbell handoffs side-to-side 3x20 ?Prone hamstring curls with 4# ankle weights 3x10 ?Prone knee hangs with 4# ankle weights 3x30sec ?Cybex hip flexion with 25# cable 2x10 BIL ?Manual Therapy: ?N/A ?Neuromuscular re-ed: ?N/A ?Therapeutic Activity: ?Step up onto Airex pad with slow retro step down in // bars 3x10 BIL ?8-inch lateral step up/ step down 2x10 BIL ?Functional squat into heel raise on Airex pad 3x10 ?Modalities: ?N/A ?Self Care: ?N/A ? ? ?[place] Adult PT Treatment:                                                DATE: 08/25/21 ?Therapeutic Exercise: ?NuStep level 6 x 5 minutes LE/UE ?Prone quad stretch 3 x 30 sec each  ?DL leg press 1 x 10 @ 20 lbs   ?Eccentric leg press 1 x 10 RLE, 1 x 10 LLE @ 20 lbs each  ?Step up knee driver 2 x 10 bilateral 8 inch step; UE support needed for step up on the LLE  ?LAQ 2 x 15 @ 4 lbs LLE ? ? ? ? ? ?  ?PATIENT  EDUCATION:  ?Education details:N/A ?Person educated: n/a ?Education method: n/a  ?Education comprehension: n/a ?  ?  ?HOME EXERCISE PROGRAM: ?Access Code: [email or web address] ?URL: [email or web address] ?Date: 08/23/2021 ?Prepared by: Edythe Lynn ? ?Exercises ?Heel slide with strap, LAYING ON BACK - 2 x daily - 7 x weekly - 3 sets - 10 reps - 5-sec hold ?Seated Hamstring Stretch - 2 x daily - 7 x weekly - 2 sets - 1-min hold ?Supine Short Arc Quad - 2 x daily - 7 x weekly - 2 sets - 10 reps ?Sit to Stand Without Arm Support - 2 x daily - 7 x weekly - 2 sets - 10 reps ?Heel Raises with Counter Support - 1 x daily - 7 x weekly - 2 sets - 10 reps ?Stride Stance Weight Shift - 2 x daily - 7 x weekly - 1 sets - 10 reps ?Side to Side Weight Shift with Counter Support - 2 x daily - 7 x weekly - 1 sets - 10 reps ?Long Sitting 4 Way Patellar Glide - 2 x daily - 7 x weekly - 1 min hold ?Forward Step Down - 1 x daily - 7 x weekly - 2 sets - 15 reps ?Lateral Step Down - 1 x daily - 7 x weekly - 2 sets - 15 reps ? ?  ?ASSESSMENT: ?  ?CLINICAL IMPRESSION: ?Pt responded excellently to all interventions today, demonstrating good form and no increased in pain with advanced LE strengthening and introductory plyometric exercises. She will continue to benefit from skilled PT to address her primary impairments and return to her prior level of function with less limitation. ?  ?REHAB POTENTIAL: Excellent ?  ?CLINICAL DECISION MAKING: Stable/uncomplicated ?  ?EVALUATION COMPLEXITY: Low ?  ?  ?GOALS: ?Goals reviewed with patient? Yes ?  ?SHORT TERM GOALS: ?  ?STG Name Target Date Goal status  ?1 Pt will report understanding and adherence to her HEP in order to promote independence in the management of her primary impairments. ?Baseline:  HEP provided at eval. 08/19/2021 achieved  ?2 Pt will achieve Lt knee flexion AROM of 100 degrees in order to promote WNL gait. ?Baseline: 56 degrees 08/19/2021 Achieved   ?  ?LONG TERM GOALS:  ?  ?LT

## 2021-09-08 ENCOUNTER — Ambulatory Visit: Payer: Medicaid Other

## 2021-09-08 ENCOUNTER — Other Ambulatory Visit: Payer: Self-pay

## 2021-09-08 DIAGNOSIS — M25562 Pain in left knee: Secondary | ICD-10-CM | POA: Diagnosis not present

## 2021-09-12 ENCOUNTER — Ambulatory Visit: Payer: Medicaid Other

## 2021-09-12 ENCOUNTER — Other Ambulatory Visit: Payer: Self-pay

## 2021-09-12 DIAGNOSIS — R262 Difficulty in walking, not elsewhere classified: Secondary | ICD-10-CM

## 2021-09-12 DIAGNOSIS — M6281 Muscle weakness (generalized): Secondary | ICD-10-CM

## 2021-09-12 DIAGNOSIS — G8929 Other chronic pain: Secondary | ICD-10-CM

## 2021-09-12 DIAGNOSIS — M25562 Pain in left knee: Secondary | ICD-10-CM | POA: Diagnosis not present

## 2021-09-12 NOTE — Therapy (Signed)
?OUTPATIENT PHYSICAL THERAPY TREATMENT NOTE ? ? ?Patient Name: Denise Morales ?MRN: [id number] ?DOB:12-Nov-1960, 61 y.o., female ?Today's Date: 09/12/2021 ? ?PCP: Everardo Beals, NP ?REFERRING PROVIDER: Everardo Beals, NP ? ? PT End of Session - 09/12/21 1019   ? ? Visit Number 13   ? Number of Visits 17   ? Date for PT Re-Evaluation 09/16/21   ? Authorization Type [place] [facility] [employer]   ? Authorization Time Period 2/1-09/24/21   ? Authorization - Visit Number 12   ? Authorization - Number of Visits 16   ? PT Start Time 1017   ? PT Stop Time 1058   ? PT Time Calculation (min) 41 min   ? Activity Tolerance Patient tolerated treatment well   ? Behavior During Therapy [facility] for tasks assessed/performed   ? ?  ?  ? ?  ? ? ? ? ? ? ? ? ? ? ? ? ? ?Past Medical History:  ?Diagnosis Date  ? Anxiety   ? Arthritis   ? Depression   ? Fibromyalgia   ? GERD (gastroesophageal reflux disease)   ? Hyperlipidemia   ? Hypertension   ? Hypothyroidism   ? Melanoma ([place])   ? face  ? Thyroid disease   ? ?Past Surgical History:  ?Procedure Laterality Date  ? ABDOMINAL HYSTERECTOMY  1985  ? CHOLECYSTECTOMY    ? COLONOSCOPY    ? SHOULDER ARTHROSCOPY WITH ROTATOR CUFF REPAIR AND SUBACROMIAL DECOMPRESSION Right 08/12/2020  ? Procedure: RIGHT SHOULDER ARTHROSCOPY WITH ROTATOR CUFF REPAIR AND SUBACROMIAL DECOMPRESSION;  Surgeon: Mcarthur Rossetti, MD;  Location: [facility];  Service: Orthopedics;  Laterality: Right;  ? TOTAL KNEE ARTHROPLASTY Right 12/24/2020  ? Procedure: RIGHT TOTAL KNEE ARTHROPLASTY;  Surgeon: Mcarthur Rossetti, MD;  Location[facility];  Service: Orthopedics;  Laterality: Right;  ? TOTAL KNEE ARTHROPLASTY Left 07/15/2021  ? Procedure: LEFT TOTAL KNEE ARTHROPLASTY;  Surgeon: Mcarthur Rossetti, MD;  Location: [facility];  Service: Orthopedics;  Laterality: Left;  ? ?Patient Active Problem List  ? Diagnosis Date Noted  ? Status post total left knee replacement 07/15/2021  ? Neoplasm of uncertain behavior  of skin 05/25/2021  ? Generalized osteoarthritis of multiple sites 05/25/2021  ? Fibromyalgia syndrome 05/25/2021  ? Restless legs 03/16/2021  ? Hyperlipidemia 03/16/2021  ? Anxiety 03/16/2021  ? Status post right knee replacement 12/24/2020  ? Unilateral primary osteoarthritis, left knee 11/11/2020  ? Unilateral primary osteoarthritis, right knee 11/11/2020  ? Complete tear of right rotator cuff 08/12/2020  ? Hypertensive disorder 05/05/2019  ? ? ?REFERRING DIAG: [id number] (ICD-10-CM) - Status post left knee replacement ? ?THERAPY DIAG:  ?Chronic pain of left knee ? ?Muscle weakness (generalized) ? ?Difficulty in walking, not elsewhere classified ? ?PERTINENT HISTORY: Lt TKA on 07/15/2021 with Dr. Jean Rosenthal ? ?PRECAUTIONS: Knee and Fall ? ?SUBJECTIVE:  ?Patient reports she is feeling good and not having pain. She only experiences pain occasionally when she first wakes in the morning when the knee is stiff.  ? ?PAIN:  ?Are you having pain? No ?NPRS scale: 0/10 ?NPRS worst scale: 3/10 ?Pain location: Lt knee  ?PAIN TYPE: tightness ?Pain description: intermittent  ? ? ? ? ?OBJECTIVE:  ? *Unless otherwise noted, objective measures collected previously* ?  ?PALPATION: 08/11/21: TTP medial and lateral joint line, TTP distal medial and lateral hamstrings; no palpable tenderness about popliteal fossa or calf.  ? ?OBSERVATION: ?Circumferential measure at superior pole of patella: Rt: 45cm, Lt: 50cm ?Post-op bandage in-tact, surgical incision not  visible ? ?08/02/21: incision site healing well with no signs of infection ?  ?LE AROM/PROM: ?  ?A/PROM Right ?07/21/2021 Left ?07/21/2021 Left ?07/27/2021 Left 08/02/21 08/09/21 08/11/21 08/25/21 09/12/21  ?Knee flexion 115/120p! 56p! 88p! 99 106 118 120 120; 124 post manual  ?Knee extension 3/5 -9p! -9p!       ? (Blank rows = not tested) ?  ?LE MMT: ?  ?MMT Right ?07/21/2021 Left ?07/21/2021 Left ?08/16/2021 Left ?09/01/2021  ?Hip flexion 4/5 3+/5 p!  3+/5  ?Hip extension        ?Hip  abduction 4/5 2+/5    ?Knee flexion 5/5 2+/5p! 4+/5 5/5  ?Knee extension 5/5 2+/5p! 4+/5 with minor p! 5/5  ? (Blank rows = not tested) ?  ?  ?FUNCTIONAL TESTS:  ?5 times sit to stand: 14 seconds ?   08/16/2021: 10 seconds ?GAIT: ?Distance walked: 20 ft ?Assistive device utilized: Environmental consultant - 2 wheeled ?Level of assistance: Modified independence ?Comments: Pt ambulates with slow gait speed and antalgic gait. Decreased Lt knee extension in stance and terminal swing. ?  ?  ?  ?TODAY'S TREATMENT: ?[facility] Adult PT Treatment:                                                DATE: 09/12/21 ?Therapeutic Exercise: ?NuStep level 5 x 5 minutes  ?Prone quad stretch x 60 sec left ?Calf stretch on wedge x 60 sec ?Leg press 2 x 10 @ 40 lbs ?Forward step down 1 x 10 8 inch; bilateral  ?Eccentric leg press LLE 1 x 10 @ 20 lbs  ? ?Manual: ?STM Lt distal quadriceps/IT band ?Lt knee flexion PROM to tolerance ?Lt tib/fem mobilizations A/P grade II-III ?Trigger Point Dry Needling Treatment: ?Pre-treatment instruction: Patient instructed on dry needling rationale, procedures, and possible side effects including pain during treatment (achy,cramping feeling), bruising, drop of blood, lightheadedness, nausea, sweating. ?Patient Consent Given: Yes ?Education handout provided: Yes ?Muscles treated: Lt rectus femoris, vastus medialis, vastus lateralis   ?Treatment response/outcome: Twitch response elicited and Palpable decrease in muscle tension ?Post-treatment instructions: Patient instructed to expect possible mild to moderate muscle soreness later today and/or tomorrow. Patient instructed in methods to reduce muscle soreness and to continue prescribed HEP. If patient was dry needled over the lung field, patient was instructed on signs and symptoms of pneumothorax and, however unlikely, to see immediate medical attention should they occur. Patient was also educated on signs and symptoms of infection and to seek medical attention should they occur.  Patient verbalized understanding of these instructions and education. ? ?Therapeutic Activity: ?Stair training pre and post manual focusing on reciprocal descent and single handrail use.  ? ?[place] Adult PT Treatment:                                                DATE: 09/08/2021 ?Therapeutic Exercise: ?Stationary bike level 3 x5 minutes while collecting subjective information ?SL dead lifts with 5# kettlebell and UE support 3x8 BIL ?Cybex leg press with 40# 3x10 ?Squat false hops onto toes 3x8 ?Standing IT band stretch with overhead reach x1 minute BIL ?Prone hamstring curls with 4# ankle weights 3x10 with slow lowering ?Prone knee hangs with 4# ankle weight 2x30sec in-between hamstring curl  sets ?Forward walkouts with retro return with thigh strap and 7# cable at [employer] x5 walkouts BIL ?Manual Therapy: ?N/A ?Neuromuscular re-ed: ?N/A ?Therapeutic Activity: ?N/A ?Modalities: ?N/A ?Self Care: ?N/A ? ? ?[place] Adult PT Treatment:                                                DATE: 09/01/2021 ?Therapeutic Exercise: ?Standing with heel on Airex pad and alternating 5# kettlbell handoffs side-to-side 3x20 ?Prone hamstring curls with 4# ankle weights 3x10 ?Prone knee hangs with 4# ankle weights 3x30sec ?Cybex hip flexion with 25# cable 2x10 BIL ?Manual Therapy: ?N/A ?Neuromuscular re-ed: ?N/A ?Therapeutic Activity: ?Step up onto Airex pad with slow retro step down in // bars 3x10 BIL ?8-inch lateral step up/ step down 2x10 BIL ?Functional squat into heel raise on Airex pad 3x10 ?Modalities: ?N/A ?Self Care: ?N/A ? ? ?[place] Adult PT Treatment:                                                DATE: 08/25/21 ?Therapeutic Exercise: ?NuStep level 6 x 5 minutes LE/UE ?Prone quad stretch 3 x 30 sec each  ?DL leg press 1 x 10 @ 20 lbs  ?Eccentric leg press 1 x 10 RLE, 1 x 10 LLE @ 20 lbs each  ?Step up knee driver 2 x 10 bilateral 8 inch step; UE support needed for step up on the LLE  ?LAQ 2 x 15 @ 4 lbs LLE ? ? ? ? ? ?   ?PATIENT EDUCATION:  ?Education details:see treatment ?Person educated: patient  ?Education method: instruction;handout  ?Education comprehension: verbalized understanding  ?  ?  ?HOME EXERCISE PROGRAM: ?[place]

## 2021-09-12 NOTE — Patient Instructions (Signed)

## 2021-09-14 ENCOUNTER — Ambulatory Visit: Payer: Medicaid Other

## 2021-09-14 ENCOUNTER — Other Ambulatory Visit: Payer: Self-pay

## 2021-09-14 DIAGNOSIS — G8929 Other chronic pain: Secondary | ICD-10-CM

## 2021-09-14 DIAGNOSIS — M6281 Muscle weakness (generalized): Secondary | ICD-10-CM

## 2021-09-14 DIAGNOSIS — R262 Difficulty in walking, not elsewhere classified: Secondary | ICD-10-CM

## 2021-09-14 DIAGNOSIS — M25562 Pain in left knee: Secondary | ICD-10-CM | POA: Diagnosis not present

## 2021-09-14 NOTE — Therapy (Signed)
?OUTPATIENT PHYSICAL THERAPY TREATMENT NOTE ? ?PHYSICAL THERAPY DISCHARGE SUMMARY ? ?Visits from Start of Care: 14 ? ?Current functional level related to goals / functional outcomes: ?All goals met ?  ?Remaining deficits: ?N/A ?  ?Education / Equipment: ?See treatment   ? ?Patient agrees to discharge. Patient goals were met. Patient is being discharged due to meeting the stated rehab goals. ? ?Patient Name: Denise Morales ?MRN: [id number] ?DOB:05/02/1961, 61 y.o., female ?Today's Date: 09/14/2021 ? ?PCP: Everardo Beals, NP ?REFERRING PROVIDER: Mcarthur Rossetti* ? ? PT End of Session - 09/14/21 1102   ? ? Visit Number 14   ? Number of Visits 17   ? Date for PT Re-Evaluation 09/16/21   ? Authorization Type [place] [facility] [employer]   ? Authorization Time Period 2/1-09/24/21   ? Authorization - Visit Number 13   ? Authorization - Number of Visits 16   ? PT Start Time 1102   ? PT Stop Time 8250   ? PT Time Calculation (min) 25 min   ? Activity Tolerance Patient tolerated treatment well   ? Behavior During Therapy [facility] for tasks assessed/performed   ? ?  ?  ? ?  ? ? ? ? ? ? ? ? ? ? ? ? ? ?Past Medical History:  ?Diagnosis Date  ? Anxiety   ? Arthritis   ? Depression   ? Fibromyalgia   ? GERD (gastroesophageal reflux disease)   ? Hyperlipidemia   ? Hypertension   ? Hypothyroidism   ? Melanoma ([place])   ? face  ? Thyroid disease   ? ?Past Surgical History:  ?Procedure Laterality Date  ? ABDOMINAL HYSTERECTOMY  1985  ? CHOLECYSTECTOMY    ? COLONOSCOPY    ? SHOULDER ARTHROSCOPY WITH ROTATOR CUFF REPAIR AND SUBACROMIAL DECOMPRESSION Right 08/12/2020  ? Procedure: RIGHT SHOULDER ARTHROSCOPY WITH ROTATOR CUFF REPAIR AND SUBACROMIAL DECOMPRESSION;  Surgeon: Mcarthur Rossetti, MD;  Location: [facility];  Service: Orthopedics;  Laterality: Right;  ? TOTAL KNEE ARTHROPLASTY Right 12/24/2020  ? Procedure: RIGHT TOTAL KNEE ARTHROPLASTY;  Surgeon: Mcarthur Rossetti, MD;  Location: [facility];  Service: Orthopedics;   Laterality: Right;  ? TOTAL KNEE ARTHROPLASTY Left 07/15/2021  ? Procedure: LEFT TOTAL KNEE ARTHROPLASTY;  Surgeon: Mcarthur Rossetti, MD;  Location: [facility];  Service: Orthopedics;  Laterality: Left;  ? ?Patient Active Problem List  ? Diagnosis Date Noted  ? Status post total left knee replacement 07/15/2021  ? Neoplasm of uncertain behavior of skin 05/25/2021  ? Generalized osteoarthritis of multiple sites 05/25/2021  ? Fibromyalgia syndrome 05/25/2021  ? Restless legs 03/16/2021  ? Hyperlipidemia 03/16/2021  ? Anxiety 03/16/2021  ? Status post right knee replacement 12/24/2020  ? Unilateral primary osteoarthritis, left knee 11/11/2020  ? Unilateral primary osteoarthritis, right knee 11/11/2020  ? Complete tear of right rotator cuff 08/12/2020  ? Hypertensive disorder 05/05/2019  ? ? ?REFERRING DIAG: [id number] (ICD-10-CM) - Status post left knee replacement ? ?THERAPY DIAG:  ?Chronic pain of left knee ? ?Muscle weakness (generalized) ? ?Difficulty in walking, not elsewhere classified ? ?PERTINENT HISTORY: Lt TKA on 07/15/2021 with Dr. Jean Rosenthal ? ?PRECAUTIONS: Knee and Fall ? ?SUBJECTIVE:  ?Patient reports she is feeling good. She has been able to go up/down the stairs without issue and was able complete yard work yesterday. She feels she is ready for discharge at this time.   ? ?PAIN:  ?Are you having pain? No ? ? ? ? ? ?OBJECTIVE:  ? *Unless otherwise noted,  objective measures collected previously* ?  ?PALPATION: 08/11/21: TTP medial and lateral joint line, TTP distal medial and lateral hamstrings; no palpable tenderness about popliteal fossa or calf.  ? ?OBSERVATION: ?Circumferential measure at superior pole of patella: Rt: 45cm, Lt: 50cm ?Post-op bandage in-tact, surgical incision not visible ? ?08/02/21: incision site healing well with no signs of infection ?  ?LE AROM/PROM: ?  ?A/PROM Right ?07/21/2021 Left ?07/21/2021 Left ?07/27/2021 Left 08/02/21 08/09/21 08/11/21 08/25/21 09/12/21 09/14/21  ?Knee flexion  115/120p! 56p! 88p! 99 106 118 120 120; 124 post manual Rt: 123; Lt 125   ?Knee extension 3/5 -9p! -9p!      Bilateral WNL  ? (Blank rows = not tested) ?  ?LE MMT: ?  ?MMT Right ?07/21/2021 Left ?07/21/2021 Left ?08/16/2021 Left ?09/01/2021 09/14/21 ?  ?Hip flexion 4/5 3+/5 p!  3+/5 Bilateral 4/5   ?Hip extension         ?Hip abduction 4/5 2+/5     ?Knee flexion 5/5 2+/5p! 4+/5 5/5 Bilateral 5/5  ?Knee extension 5/5 2+/5p! 4+/5 with minor p! 5/5 Bilateral 5/5   ? (Blank rows = not tested) ?  ?  ?FUNCTIONAL TESTS:  ?5 times sit to stand: 14 seconds ?   08/16/2021: 10 seconds ?GAIT: ?Distance walked: 20 ft ?Assistive device utilized: Environmental consultant - 2 wheeled ?Level of assistance: Modified independence ?Comments: Pt ambulates with slow gait speed and antalgic gait. Decreased Lt knee extension in stance and terminal swing.  ? ?09/14/21: Independent ambulation, occasional lateral trunk lean  ?  ?  ?  ?TODAY'S TREATMENT: ?[facility] Adult PT Treatment:                                                DATE: 09/14/21 ?Therapeutic Exercise: ?Reviewed and updated HEP discussing frequency and independent progression of strengthening and balance exercises ?Discussed walking program to implement for cardiovascular fitness; handout provided.  ? ?Therapeutic Activity: ?Re-assessment to determine overall progress, educating patient on progress towards goals.  ? ? ? ?[facility] Adult PT Treatment:                                                DATE: 09/12/21 ?Therapeutic Exercise: ?NuStep level 5 x 5 minutes  ?Prone quad stretch x 60 sec left ?Calf stretch on wedge x 60 sec ?Leg press 2 x 10 @ 40 lbs ?Forward step down 1 x 10 8 inch; bilateral  ?Eccentric leg press LLE 1 x 10 @ 20 lbs  ? ?Manual: ?STM Lt distal quadriceps/IT band ?Lt knee flexion PROM to tolerance ?Lt tib/fem mobilizations A/P grade II-III ?Trigger Point Dry Needling Treatment: ?Pre-treatment instruction: Patient instructed on dry needling rationale, procedures, and possible side effects  including pain during treatment (achy,cramping feeling), bruising, drop of blood, lightheadedness, nausea, sweating. ?Patient Consent Given: Yes ?Education handout provided: Yes ?Muscles treated: Lt rectus femoris, vastus medialis, vastus lateralis   ?Treatment response/outcome: Twitch response elicited and Palpable decrease in muscle tension ?Post-treatment instructions: Patient instructed to expect possible mild to moderate muscle soreness later today and/or tomorrow. Patient instructed in methods to reduce muscle soreness and to continue prescribed HEP. If patient was dry needled over the lung field, patient was instructed on signs and symptoms of pneumothorax  and, however unlikely, to see immediate medical attention should they occur. Patient was also educated on signs and symptoms of infection and to seek medical attention should they occur. Patient verbalized understanding of these instructions and education. ? ?Therapeutic Activity: ?Stair training pre and post manual focusing on reciprocal descent and single handrail use.  ? ?[facility] Adult PT Treatment:                                                DATE: 09/08/2021 ?Therapeutic Exercise: ?Stationary bike level 3 x5 minutes while collecting subjective information ?SL dead lifts with 5# kettlebell and UE support 3x8 BIL ?Cybex leg press with 40# 3x10 ?Squat false hops onto toes 3x8 ?Standing IT band stretch with overhead reach x1 minute BIL ?Prone hamstring curls with 4# ankle weights 3x10 with slow lowering ?Prone knee hangs with 4# ankle weight 2x30sec in-between hamstring curl sets ?Forward walkouts with retro return with thigh strap and 7# cable at [employer] machine x5 walkouts BIL ? ? ? ? ? ? ? ? ? ?  ?PATIENT EDUCATION:  ?Education details:see treatment; D/C education  ?Person educated: patient  ?Education method: instruction;handout  ?Education comprehension: verbalized understanding  ?  ?  ?HOME EXERCISE PROGRAM: ?Access Code: [email or web address] ? ? ?   ?ASSESSMENT: ?  ?CLINICAL IMPRESSION: ?Ricardo has made excellent functional progress since the start of care having met all established functional goals. She demonstrates functional knee AROM, improvements in hip and k

## 2021-09-22 ENCOUNTER — Ambulatory Visit (INDEPENDENT_AMBULATORY_CARE_PROVIDER_SITE_OTHER): Payer: Medicaid Other | Admitting: Orthopaedic Surgery

## 2021-09-22 DIAGNOSIS — Z96652 Presence of left artificial knee joint: Secondary | ICD-10-CM

## 2021-09-22 MED ORDER — BACLOFEN 10 MG PO TABS: 10.0000 mg | ORAL_TABLET | Freq: Three times a day (TID) | ORAL | 0 refills | Status: DC | PRN

## 2021-09-22 MED ORDER — TRAMADOL HCL 50 MG PO TABS: 100.0000 mg | ORAL_TABLET | Freq: Four times a day (QID) | ORAL | 0 refills | Status: DC | PRN

## 2021-09-22 NOTE — Progress Notes (Signed)
The patient is now 10 weeks status post a left total knee arthroplasty.  She has now been released by therapy.  She is very happy overall.  She has a remote history of a right knee replacement.  She is walk without assistive device.  She does need a refill on occasional tramadol and baclofen but is not using this on a regular basis.  She is almost really back to all the things that she normally does. ? ?She walks without a limp or assistive device.  Her left more recent operative knee has full range of motion and is stable ligamentously. ? ?I will send in some more tramadol and baclofen.  We will see her back in 6 months or sooner if there is any any issues.  At her next visit we will have an AP and lateral of her left total knee. ?

## 2021-09-26 DIAGNOSIS — R0789 Other chest pain: Secondary | ICD-10-CM | POA: Diagnosis not present

## 2021-09-26 DIAGNOSIS — R1310 Dysphagia, unspecified: Secondary | ICD-10-CM | POA: Diagnosis not present

## 2021-09-26 DIAGNOSIS — J209 Acute bronchitis, unspecified: Secondary | ICD-10-CM | POA: Diagnosis not present

## 2021-09-26 DIAGNOSIS — K219 Gastro-esophageal reflux disease without esophagitis: Secondary | ICD-10-CM | POA: Diagnosis not present

## 2021-10-11 ENCOUNTER — Telehealth: Payer: Self-pay

## 2021-10-11 ENCOUNTER — Other Ambulatory Visit: Payer: Self-pay | Admitting: Orthopaedic Surgery

## 2021-10-11 MED ORDER — ROPINIROLE HCL 0.25 MG PO TABS: 0.2500 mg | ORAL_TABLET | Freq: Every day | ORAL | 1 refills | Status: DC

## 2021-10-11 NOTE — Telephone Encounter (Signed)
Refill request for Ropinirole 0.'25mg'$   ?[facility] ?

## 2021-10-17 ENCOUNTER — Ambulatory Visit: Payer: Medicaid Other | Admitting: Cardiology

## 2021-10-17 ENCOUNTER — Encounter: Payer: Self-pay | Admitting: Cardiology

## 2021-10-17 VITALS — BP 133/88 | HR 94 | Temp 97.9°F | Resp 16 | Ht 59.0 in | Wt 173.8 lb

## 2021-10-17 DIAGNOSIS — K219 Gastro-esophageal reflux disease without esophagitis: Secondary | ICD-10-CM | POA: Diagnosis not present

## 2021-10-17 DIAGNOSIS — I10 Essential (primary) hypertension: Secondary | ICD-10-CM

## 2021-10-17 DIAGNOSIS — R072 Precordial pain: Secondary | ICD-10-CM | POA: Diagnosis not present

## 2021-10-17 DIAGNOSIS — E782 Mixed hyperlipidemia: Secondary | ICD-10-CM | POA: Diagnosis not present

## 2021-10-17 MED ORDER — PANTOPRAZOLE SODIUM 40 MG PO TBEC: 40.0000 mg | DELAYED_RELEASE_TABLET | Freq: Every day | ORAL | 0 refills | Status: DC

## 2021-10-17 NOTE — Progress Notes (Signed)
? ? ?10/20/2021 ?Denise Morales ?[id number] ?06/04/1961 ? ?Referring provider: Everardo Beals, NP ?Primary GI doctor: Dr. Candis Schatz ? ?ASSESSMENT AND PLAN:  ? ?Esophageal dysphagia ?Will schedule EGD to evaluate for possible H. pylori, eosinophilic esophagitis, peptic ulcer disease, or strictures. ?I discussed risks of EGD with patient today, including risk of sedation, bleeding or perforation.  ?Patient provides understanding and gave verbal consent to proceed. ? ?Gastroesophageal reflux disease with esophagitis without hemorrhage ?LPR ?non specific chest pain with family history of CAD ?Getting echo, stress test and cardiac scoring, does appear to be more LPR/GERd however will optimize medical treatment and schedule for EGd AFTER cardiac work up. ?Diet discussed, elevate head of bed, weight loss ?-     pantoprazole (PROTONIX) 40 MG tablet; Take 1 tablet (40 mg total) by mouth 2 (two) times daily before a meal. ?-     sucralfate (CARAFATE) 1 g tablet; Take 1 tablet (1 g total) by mouth 3 (three) times daily as needed (heart burn). ? ?History of adenomatous polyp of colon ?05/30/2021 colonoscopy with Dr. Candis Schatz for screening purposes-good bowel prep, Suprep.  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 ? ?IBS D ?S/p choley, will add on fiber, try FODMAP, if not helping can do trial of colestid since s/p choley.  ?If that does not help can consider xifaxin/doxy trial verus testing for EPI. ?- Discussed FODMAP diet, avoid lactose.  ? ?History of Present Illness:  ?61 y.o. female  with a past medical history of hypertension, hyperlipidemia, thyroid disease, depression, history of melanoma and GERD, status post cholecystectomy and others listed below, returns to clinic today for evaluation of IBS and GERD. ? ?05/30/2021 colonoscopy with Dr. Candis Schatz for screening purposes-good bowel prep, Suprep.  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 ? ?She states she woke up one more with throat burning first of the month with left should, arm, jaw pain and dry heaving. Lasted for several hours. She was pacing the floors, only help was laying over twin bed to press on her chest.  ?Since that throat burning, she has had a non productive cough, rare SOB movement. Has not any more chest pain, no palpitations.  ?Tessalon 3 x a day has not been helped. ?Sent to [facility] for chest pain, goes next week for stress test and echo. ?Has been on pantoprazole with PCP, 40 mg once daily.  ? ?She is having trouble swallowing pills only, will get stuck in her throat and will have to drink fluids to get it down. Has been several months, nothing with food or liquid.  ?She denies melena, AB pain.  ?She reports nocturnal symptoms worse at night, will start with cough and throat burning. Eats dinner at 6 pm goes to bed at 12.  ?She drinks diet Dr. Malachi Bonds one per day.   ?She reports LPR symptoms such as hoarseness or cough.  ?She denies NSAID use.  ?She denies ETOH use.   ?No unintentional weight loss, she has some sweating at night, no drenching sweats.  ? ?S/p bilateral knee, 12/2020, 06/2021. ? ?CBC  07/16/2021  ?HGB 10.5 MCV 88.5 with normocytic anemia status post left total knee replacement, had blood transfusion. ?WBC 13.9 Platelets 371 ?Kidney function 07/16/2021  ?BUN 20 Cr 0.94  ?GFR >60  ?Potassium 4.2   ?LFTs 01/01/2020  ?AST 16 ALT 13 ?Alkphos 99  TBili 0.3 ?01/01/2020 TSH 3.110 ? ?Current Medications:  ? ?Current Outpatient Medications (Endocrine & Metabolic):  ?  levothyroxine (SYNTHROID) 50 MCG tablet, Take 50 mcg by mouth daily. ? ?Current Outpatient Medications (Cardiovascular):  ?  lisinopril-hydrochlorothiazide (ZESTORETIC) 10-12.5 MG tablet, Take 1 tablet by mouth daily. ?  pravastatin (PRAVACHOL) 40 MG tablet, Take 1 tablet (40 mg  total) by mouth daily. ? ?Current Outpatient Medications (Respiratory):  ?  benzonatate (TESSALON) 200 MG capsule, Take 200 mg by mouth 3 (three) times daily as needed for cough. ? ?Current Outpatient Medications (Analgesics):  ?  allopurinol (ZYLOPRIM) 100 MG tablet, Take 100 mg by mouth daily. ?  nabumetone (RELAFEN) 500 MG tablet, Take 1 tablet (500 mg total) by mouth 2 (two) times daily as needed. ?  traMADol (ULTRAM) 50 MG tablet, Take 2 tablets (100 mg total) by mouth every 6 (six) hours as needed. ? ? ?Current Outpatient Medications (Other):  ?  baclofen (LIORESAL) 10 MG tablet, Take 1 tablet (10 mg total) by mouth 3 (three) times daily as needed for muscle spasms. 1 (ONE) TABLET BY MOUTH THREE TIMES DAILY AS NEEDED FOR MUSCLE SPASMS ?  busPIRone (BUSPAR) 10 MG tablet, Take 10 mg by mouth 3 (three) times daily. ?  dicyclomine (BENTYL) 10 MG capsule, Take 10 mg by mouth every 6 (six) hours as needed for spasms. ?  escitalopram (LEXAPRO) 20 MG tablet, Take 1 tablet (20 mg total) by mouth daily. ?  rOPINIRole (REQUIP) 0.25 MG tablet, Take 1 tablet (0.25 mg total) by mouth at bedtime. ?  Turmeric 500 MG CAPS, Take 500 mg by mouth daily. ?  Vitamin D-Vitamin K (VITAMIN K2-VITAMIN D3 PO), Take 1 tablet by mouth daily. ?  pantoprazole (PROTONIX) 40 MG tablet, Take 1 tablet (40 mg total) by mouth 2 (two) times daily before a meal. ?  sucralfate (CARAFATE) 1 g tablet, Take 1 tablet (1 g total) by mouth 3 (three) times daily as needed (heart burn). ? ?Surgical History:  ?She  has a past surgical history that includes Abdominal hysterectomy (1985); Cholecystectomy; Shoulder arthroscopy with rotator cuff repair and subacromial decompression (Right, 08/12/2020); Total knee arthroplasty (Right, 12/24/2020); Colonoscopy; and Total knee arthroplasty (Left, 07/15/2021). ?Family History:  ?Her family history includes Arthritis in her sister; Colon polyps in her father and sister; Dementia in her mother; Diabetes in her father,  paternal aunt, sister, and sister; Fibromyalgia in her sister and sister; Healthy in her daughter; Heart Problems in her mother, sister, and sister; Hypertension in her father, mother, sister, sister, and sister; Stroke in her mother. ?Social History:  ? reports that she has never smoked. She has never used smokeless tobacco. She reports that she does not currently use alcohol. She reports that she does not use drugs. ? ?Current Medications, Allergies, Past Medical History, Past Surgical History, Family History and Social History were reviewed in [employer] record. ? ?Physical Exam: ?BP 100/60   Pulse 82   Ht 4' 10.25" (1.48 m)   Wt 173 lb 2 oz (78.5 kg)   BMI 35.87 kg/m?  ?General:   Pleasant, well developed female in no acute distress ?Heart : Regular rate and rhythm; no murmurs ?Pulm: Clear anteriorly; no wheezing ?Abdomen:  Soft, Obese AB, Active bowel sounds. No tenderness . Without guarding and Without rebound, No organomegaly appreciated. ?Extremities:  without  edema. ?Neurologic:  Alert and  oriented x4;  No focal deficits.  ?Psych:  Cooperative. Normal mood and affect. ? ? ?Vladimir Crofts,  PA-C ?10/20/21 ?

## 2021-10-17 NOTE — Progress Notes (Signed)
? ?ID:  Denise Morales, DOB 02-19-1961, MRN [id number] ? ?PCP:  Everardo Beals, NP  ?Cardiologist:  Rex Kras, DO, [facility] (established care October 17, 2021) ? ?REASON FOR CONSULT: Chest pain ? ?REQUESTING PHYSICIAN:  ?Everardo Beals, NP ?[address],  [place] [id number] ? ?Chief Complaint  ?Patient presents with  ? Chest Pain  ? New Patient (Initial Visit)  ? ? ?HPI  ?Denise Morales is a 61 y.o. Caucasian female whose past medical history and cardiovascular risk factors include: Hypertension, hyperlipidemia, anxiety, obesity due to excess calories, GERD. ? ?She is referred to the office at the request of Everardo Beals, NP for evaluation of chest pain. ? ?Few weeks ago while asleep patient wakes up with chest discomfort and indigestion like feeling.  The discomfort was over the anterior precordium, back, arm and jaw pain.  She describes the discomfort as a heartburn, lasting for several hours, she was up for at least 4 hours trying to get symptom relief.  She tried to walk around the house to see if this would help but no change with exertion.  Symptoms get better with leaning forward over a bed.  She is already on Prilosec as well as Tums on a regular basis for indigestion.  Her EGD is upcoming. ? ?Chest pain/jaw pain is nonexertional, does not resolve with resting, usually self-limited.  And the symptoms are usually at night while she is trying to go to sleep or has already fallen asleep. ? ?Family history of premature coronary artery disease.  Both sisters had heart disease less than 26 years of age.  However they both have confounding factors such as obesity and smoking. ? ?Has noted decreased physical endurance in the recent past.  ? ?Patient also being treated for bronchitis. ? ?FUNCTIONAL STATUS: ?Enjoys doing yard work and active around the house.  No structured exercise program or daily routine ? ?ALLERGIES: ?Allergies  ?Allergen Reactions  ? Hydroxyzine   ?  Other reaction(s):  Hallucinations  ? ? ?MEDICATION LIST PRIOR TO VISIT: ?Current Meds  ?Medication Sig  ? allopurinol (ZYLOPRIM) 100 MG tablet Take 100 mg by mouth daily.  ? amoxicillin (AMOXIL) 875 MG tablet Take 875 mg by mouth 2 (two) times daily.  ? aspirin 81 MG chewable tablet Chew 1 tablet (81 mg total) by mouth 2 (two) times daily.  ? baclofen (LIORESAL) 10 MG tablet Take 1 tablet (10 mg total) by mouth 3 (three) times daily as needed for muscle spasms. 1 (ONE) TABLET BY MOUTH THREE TIMES DAILY AS NEEDED FOR MUSCLE SPASMS  ? benzonatate (TESSALON) 200 MG capsule Take 200 mg by mouth 3 (three) times daily as needed for cough.  ? busPIRone (BUSPAR) 10 MG tablet Take 10 mg by mouth 3 (three) times daily.  ? dicyclomine (BENTYL) 10 MG capsule Take 10 mg by mouth every 6 (six) hours as needed for spasms.  ? escitalopram (LEXAPRO) 20 MG tablet Take 1 tablet (20 mg total) by mouth daily.  ? HYDROcodone-acetaminophen (NORCO/VICODIN) 5-325 MG tablet Take 1 tablet by mouth every 6 (six) hours as needed for moderate pain.  ? levothyroxine (SYNTHROID) 50 MCG tablet Take 50 mcg by mouth daily.  ? lisinopril-hydrochlorothiazide (ZESTORETIC) 10-12.5 MG tablet Take 1 tablet by mouth daily.  ? nabumetone (RELAFEN) 500 MG tablet Take 1 tablet (500 mg total) by mouth 2 (two) times daily as needed.  ? pantoprazole (PROTONIX) 40 MG tablet Take 1 tablet (40 mg total) by mouth daily.  ? pravastatin (PRAVACHOL) 40 MG  tablet Take 1 tablet (40 mg total) by mouth daily.  ? rOPINIRole (REQUIP) 0.25 MG tablet Take 1 tablet (0.25 mg total) by mouth at bedtime.  ? sucralfate (CARAFATE) 1 g tablet Take 1 g by mouth 4 (four) times daily -  with meals and at bedtime.  ? traMADol (ULTRAM) 50 MG tablet Take 2 tablets (100 mg total) by mouth every 6 (six) hours as needed.  ? traZODone (DESYREL) 50 MG tablet Take 50 mg by mouth at bedtime.  ? Turmeric 500 MG CAPS Take 500 mg by mouth daily.  ? Vitamin D-Vitamin K (VITAMIN K2-VITAMIN D3 PO) Take 1 tablet by mouth  daily.  ? [DISCONTINUED] omeprazole (PRILOSEC) 40 MG capsule Take 1 capsule by mouth once daily  ?  ? ?PAST MEDICAL HISTORY: ?Past Medical History:  ?Diagnosis Date  ? Anxiety   ? Arthritis   ? Depression   ? Fibromyalgia   ? GERD (gastroesophageal reflux disease)   ? Hyperlipidemia   ? Hypertension   ? Hypothyroidism   ? Melanoma ([place])   ? face  ? Thyroid disease   ? ? ?PAST SURGICAL HISTORY: ?Past Surgical History:  ?Procedure Laterality Date  ? ABDOMINAL HYSTERECTOMY  1985  ? CHOLECYSTECTOMY    ? COLONOSCOPY    ? SHOULDER ARTHROSCOPY WITH ROTATOR CUFF REPAIR AND SUBACROMIAL DECOMPRESSION Right 08/12/2020  ? Procedure: RIGHT SHOULDER ARTHROSCOPY WITH ROTATOR CUFF REPAIR AND SUBACROMIAL DECOMPRESSION;  Surgeon: Mcarthur Rossetti, MD;  Location: [facility];  Service: Orthopedics;  Laterality: Right;  ? TOTAL KNEE ARTHROPLASTY Right 12/24/2020  ? Procedure: RIGHT TOTAL KNEE ARTHROPLASTY;  Surgeon: Mcarthur Rossetti, MD;  Location: [facility];  Service: Orthopedics;  Laterality: Right;  ? TOTAL KNEE ARTHROPLASTY Left 07/15/2021  ? Procedure: LEFT TOTAL KNEE ARTHROPLASTY;  Surgeon: Mcarthur Rossetti, MD;  Location: [facility];  Service: Orthopedics;  Laterality: Left;  ? ? ?FAMILY HISTORY: ?The patient family history includes Arthritis in her sister; Colon polyps in her father and sister; Dementia in her mother; Diabetes in her father, paternal aunt, sister, and sister; Fibromyalgia in her sister and sister; Healthy in her daughter; Heart Problems in her mother, sister, and sister; Hypertension in her father, mother, sister, sister, and sister; Stroke in her mother. ? ?SOCIAL HISTORY:  ?The patient  reports that she has never smoked. She has never used smokeless tobacco. She reports that she does not currently use alcohol. She reports that she does not use drugs. ? ?REVIEW OF SYSTEMS: ?Review of Systems  ?Constitutional: Positive for malaise/fatigue.  ?Cardiovascular:  Positive for chest pain  (see HPI). Negative for dyspnea on exertion, leg swelling, near-syncope, orthopnea, palpitations, paroxysmal nocturnal dyspnea and syncope.  ?Respiratory:  Positive for shortness of breath.   ?Gastrointestinal:  Positive for heartburn.  ? ?PHYSICAL EXAM: ? ?  10/17/2021  ?  9:12 AM 07/16/2021  ?  1:14 PM 07/16/2021  ?  5:35 AM  ?Vitals with BMI  ?Height _0     ?Weight 173 lbs 13 oz    ?BMI 35.08    ?Systolic 741 98 99  ?Diastolic 88 74 58  ?Pulse 94 69 68  ? ? ?CONSTITUTIONAL: Well-developed and well-nourished. No acute distress.  ?SKIN: Skin is warm and dry. No rash noted. No cyanosis. No pallor. No jaundice ?HEAD: Normocephalic and atraumatic.  ?EYES: No scleral icterus ?MOUTH/THROAT: Moist oral membranes.  ?NECK: No JVD present. No thyromegaly noted. No carotid bruits  ?CHEST Normal respiratory effort. No intercostal retractions  ?LUNGS: Clear to  auscultation bilaterally.  No stridor. No wheezes. No rales.  ?CARDIOVASCULAR: Regular rate and rhythm, positive S1-S2, no murmurs rubs or gallops appreciated. ?ABDOMINAL: Soft, nontender, nondistended, positive bowel sounds in all 4 quadrants, no apparent ascites.  ?EXTREMITIES: No peripheral edema, warm to touch, 2+ bilateral DP and PT pulses ?HEMATOLOGIC: No significant bruising ?NEUROLOGIC: Oriented to person, place, and time. Nonfocal. Normal muscle tone.  ?PSYCHIATRIC: Normal mood and affect. Normal behavior. Cooperative ? ?CARDIAC DATABASE: ?EKG: ?October 17, 2021 normal sinus rhythm, 80 bpm, without underlying ischemia injury pattern. ? ?Echocardiogram: ?No results found for this or any previous visit from the past 1095 days. ?  ? ?Stress Testing: ?No results found for this or any previous visit from the past 1095 days. ? ? ?Heart Catheterization: ?None ? ?LABORATORY DATA: ?External Labs: ?Collected: July 27, 2021. ?TSH 1.33. ? ?Collected 06/17/2021. ?BUN 30, creatinine 1.3. ?eGFR 46 sodium 141, potassium 4.3, chloride 104, bicarb 27 ?AST 13, ALT 8 ? ?Collected:  03/16/2021. ?Total cholesterol 190, HDL 52, triglycerides 269, LDL 100, non-HDL 138 ? ? ?IMPRESSION: ? ?  ICD-10-CM   ?1. Precordial pain  R07.2 EKG 12-Lead  ?  PCV ECHOCARDIOGRAM COMPLETE  ?  PCV CARDIAC STR

## 2021-10-20 ENCOUNTER — Ambulatory Visit: Payer: Medicaid Other | Admitting: Physician Assistant

## 2021-10-20 ENCOUNTER — Encounter: Payer: Self-pay | Admitting: Physician Assistant

## 2021-10-20 VITALS — BP 100/60 | HR 82 | Ht 58.25 in | Wt 173.1 lb

## 2021-10-20 DIAGNOSIS — K2100 Gastro-esophageal reflux disease with esophagitis, without bleeding: Secondary | ICD-10-CM

## 2021-10-20 DIAGNOSIS — R1319 Other dysphagia: Secondary | ICD-10-CM

## 2021-10-20 DIAGNOSIS — K580 Irritable bowel syndrome with diarrhea: Secondary | ICD-10-CM

## 2021-10-20 DIAGNOSIS — K219 Gastro-esophageal reflux disease without esophagitis: Secondary | ICD-10-CM | POA: Diagnosis not present

## 2021-10-20 DIAGNOSIS — Z86010 Personal history of colonic polyps: Secondary | ICD-10-CM

## 2021-10-20 DIAGNOSIS — Z860101 Personal history of adenomatous and serrated colon polyps: Secondary | ICD-10-CM

## 2021-10-20 MED ORDER — SUCRALFATE 1 G PO TABS: 1.0000 g | ORAL_TABLET | Freq: Three times a day (TID) | ORAL | 0 refills | Status: DC | PRN

## 2021-10-20 MED ORDER — PANTOPRAZOLE SODIUM 40 MG PO TBEC: 40.0000 mg | DELAYED_RELEASE_TABLET | Freq: Two times a day (BID) | ORAL | 3 refills | Status: DC

## 2021-10-20 NOTE — Patient Instructions (Addendum)
Increase pantoprazole to 40 mg twice a day ? ?Elevate the head of your bed at least 3 inches or sit up.  ? ?Can take carafate/sulcrafate up to 4 x a day, can take at night, big white pill ? ?Will schedule for EGD to evaluate AFTER YOUR CARDIAC EXAM.  ? ? It has been recommended to you by your physician that you have a(n) Endoscopy completed. Per your request, we did not schedule the procedure(s) today. Please contact our office at [phone] should you decide to have the procedure completed. Call me Shirlean Mylar at [phone] to schedule EGD if all Cardiac test are normal. I can send you instructions through My Chart ? ?Silent reflux: Not all heartburn burns...... ? ?What is LPR? ?Laryngopharyngeal reflux (LPR) or silent reflux is a condition in which acid that is made in the stomach travels up the esophagus (swallowing tube) and gets to the throat. ?Not everyone with reflux has a lot of heartburn or indigestion. In fact, many people with LPR never have heartburn. This is why LPR is called SILENT REFLUX, and the terms "Silent reflux" and "LPR" are often used interchangeably. Because LPR is silent, it is sometimes difficult to diagnose. ? ?How can you tell if you have LPR?  ?Chronic hoarseness- Some people have hoarseness that comes and goes ?throat clearing  ?Cough ?It can cause shortness of breath and cause asthma like symptoms. ?a feeling of a lump in the throat  ?difficulty swallowing ?a problem with too much nose and throat drainage.  ?Some people will feel their esophagus spasm which feels like their heart beating hard and fast, this will usually be after a meal, at rest, or lying down at night.   ? ?How do I treat this? ?Treatment for LPR should be individualized, and your doctor will suggest the best treatment for you. Generally there are several treatments for LPR: ?changing habits and diet to reduce reflux,  ?medications to reduce stomach acid, and  ?surgery to prevent reflux. ?Most people with LPR need to  modify how and when they eat, as well as take some medication, to get well. Sometimes, nonprescription liquid antacids, such as Maalox?, Gelucil? and Mylanta? are recommended. When used, these antacids should be taken four times each day - one tablespoon one hour after each meal and before bedtime. ?Dietary and lifestyle changes alone are not often enough to control LPR - medications that reduce stomach acid are also usually needed. These must be prescribed by our doctor. ? ? ?TIPS FOR REDUCING REFLUX AND LPR ?Control your LIFE-STYLE and your DIET! ?If you use tobacco, QUIT.  ?Smoking makes you reflux. After every cigarette you have some LPR.  ?Don't wear clothing that is too tight, especially around the waist (trousers, corsets, belts).  ?Do not lie down just after eating...in fact, do not eat within three hours of bedtime.  ?You should be on a low-fat diet.  ?Limit your intake of red meat.  ?Limit your intake of butter.  ?Avoid fried foods.  ?Avoid chocolate  ?Avoid cheese.  ?Avoid eggs. ?Specifically avoid caffeine (especially coffee and tea), soda pop (especially cola) and mints.  ?Avoid alcoholic beverages, particularly in the evening. ? ? ?Diverticulosis ?Diverticulosis is a condition that develops when small pouches (diverticula) form in the wall of the large intestine (colon). The colon is where water is absorbed and stool (feces) is formed. The pouches form when the inside layer of the colon pushes through weak spots in the outer layers of the colon. You may  have a few pouches or many of them. ?The pouches usually do not cause problems unless they become inflamed or infected. When this happens, the condition is called diverticulitis- this is left lower quadrant pain, diarrhea, fever, chills, nausea or vomiting.  If this occurs please call the office or go to the hospital. ?Sometimes these patches without inflammation can also have painless bleeding associated with them, if this happens please call the  office or go to the hospital. ?Preventing constipation and increasing fiber can help reduce diverticula and prevent complications. ?Even if you feel you have a high-fiber diet, suggest getting on Benefiber 2-3 times daily. ? ?ADD BENEFIBER, IF NOT BETTER CAN TRY COLESTID ? ?Please go to the ER if you have any severe AB pain, unable to hold down food/water, blood in stool or vomit, chest pain, shortness of breath, or any worsening symptoms.  ?  ?Consider keeping a food diary- common causes of diarrhea are dairy, certain carbs... ?  ?FODMAP stands for fermentable oligo-, di-, mono-saccharides and polyols (1). ?These are the scientific terms used to classify groups of carbs that are notorious for triggering digestive symptoms like bloating, gas and stomach pain.  ? ?FODMAPs are found in a wide range of foods in varying amounts. Some foods contain just one type, while others contain several. ? ?The main dietary sources of the four groups of FODMAPs include:  ?Oligosaccharides: Wheat, rye, legumes and various fruits and vegetables, such as garlic and onions. ? ?Disaccharides: Milk, yogurt and soft cheese. Lactose is the main carb. ? ?Monosaccharides: Various fruit including figs and mangoes, and sweeteners such as honey and agave nectar. Fructose is the main carb. ? ?Polyols: Certain fruits and vegetables including blackberries and lychee, as well as some low-calorie sweeteners like those in sugar-free gum. ?  ?Keep a food diary. This will help you identify foods that cause symptoms. Write down: ?What you eat and when. ?What symptoms you have. ?When symptoms occur in relation to your meals. ?Avoid foods that cause symptoms. Talk with your dietitian about other ways to get the same nutrients that are in these foods. ?Eat your meals slowly, in a relaxed setting. ?Aim to eat 5-6 small meals per day. Do not skip meals. ?Drink enough fluids to keep your urine clear or pale yellow. ?If dairy products cause your symptoms to  flare up, try eating less of them. You might be able to handle yogurt better than other dairy products because it contains bacteria that help with digestion. ?  ? ? ?If you are age 57 or older, your body mass index should be between 23-30. Your Body mass index is 35.87 kg/m?Marland Kitchen If this is out of the aforementioned range listed, please consider follow up with your Primary Care Provider. ? ?If you are age 50 or younger, your body mass index should be between 19-25. Your Body mass index is 35.87 kg/m?Marland Kitchen If this is out of the aformentioned range listed, please consider follow up with your Primary Care Provider.  ? ?________________________________________________________ ? ?The [facility] providers would like to encourage you to use [facility] to communicate with providers for non-urgent requests or questions.  Due to long hold times on the telephone, sending your provider a message by [facility] may be a faster and more efficient way to get a response.  Please allow 48 business hours for a response.  Please remember that this is for non-urgent requests.  ?_______________________________________________________  ? ?I appreciate the  opportunity to care for you ? ?Thank You  ? ?  Amanda Collier,PA-C ?

## 2021-10-21 NOTE — Progress Notes (Signed)
Agree with the assessment and plan as outlined by Amanda Collier,  PA-C.  Jimel Myler E. Eura Mccauslin, MD  

## 2021-10-25 ENCOUNTER — Ambulatory Visit: Payer: Medicaid Other

## 2021-10-25 DIAGNOSIS — R072 Precordial pain: Secondary | ICD-10-CM | POA: Diagnosis not present

## 2021-11-03 ENCOUNTER — Telehealth: Payer: Self-pay | Admitting: *Deleted

## 2021-11-03 NOTE — Telephone Encounter (Signed)
Stress test is scheduled for 5/26. She has not ben able to schedule the EGD yet till she has her stress test and its ok.  ?

## 2021-11-09 ENCOUNTER — Ambulatory Visit
Admission: RE | Admit: 2021-11-09 | Discharge: 2021-11-09 | Disposition: A | Discharge status: Home / Self Care | Payer: No Typology Code available for payment source | Source: Ambulatory Visit | Attending: Cardiology | Admitting: Cardiology

## 2021-11-09 DIAGNOSIS — R072 Precordial pain: Secondary | ICD-10-CM

## 2021-11-09 IMAGING — CT CT CARDIAC CORONARY ARTERY CALCIUM SCORE
3 series · 14 of 20 positions shown, 16 images · non-contrast
Comparison: None Available.

CLINICAL DATA: 60-year-old white female with precordial pain.
Hyperlipidemia. Family history of premature coronary artery disease.

EXAM:
CT CARDIAC CORONARY ARTERY CALCIUM SCORE
TECHNIQUE: Non-contrast imaging through the heart was performed using
prospective ECG gating. Image post processing was performed on an
independent workstation, allowing for quantitative analysis of the
heart and coronary arteries. Note that this exam targets the heart
and the chest was not imaged in its entirety.

[Series 13: calcium scoring 2.00 qr36 bestdiast 70% hrt calciu · axial · 0.43mm/px · z∈[+1651,+1719]mm · 4 of 58 slices shown]
[im 12/58  vessel]
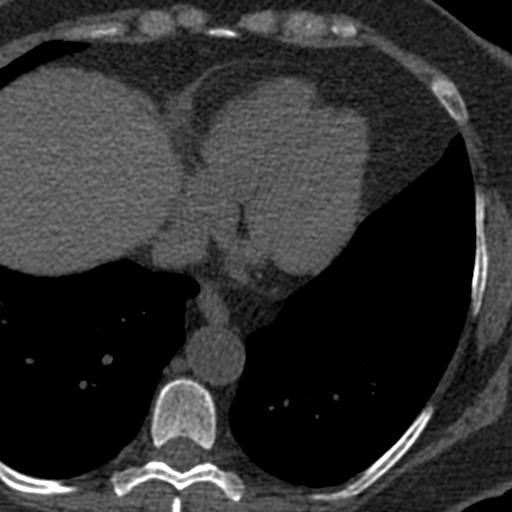
[im 23/58  vessel]
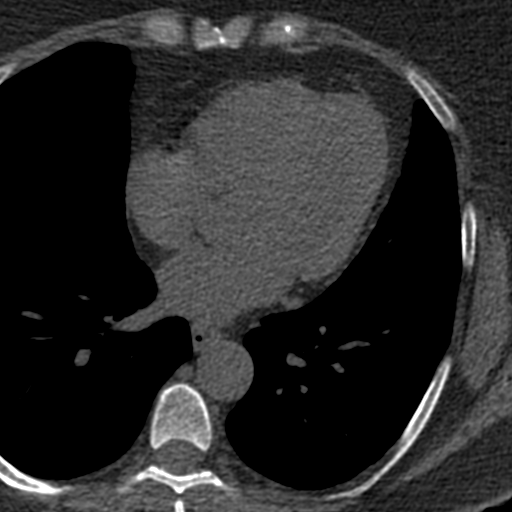
[im 35/58  vessel]
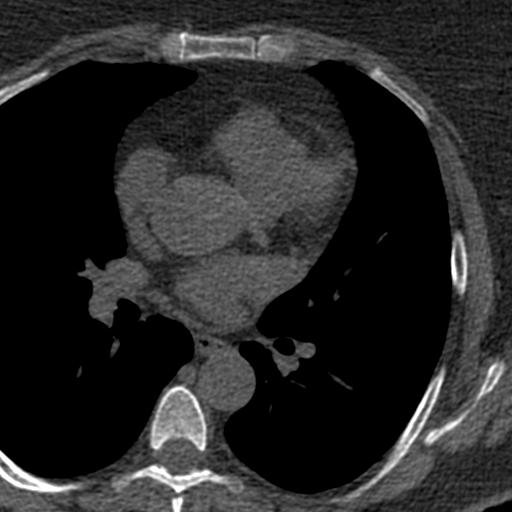
[im 46/58  vessel]
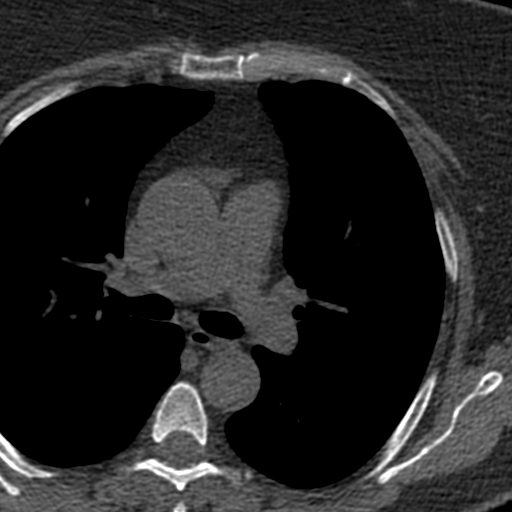

[Series 14: calcium scoring 2.00 br40 bestdiast 70% axial · axial · 0.58mm/px · z∈[+1645,+1723]mm · 5 of 59 slices shown, 7 images]
[im 10/59  vessel]
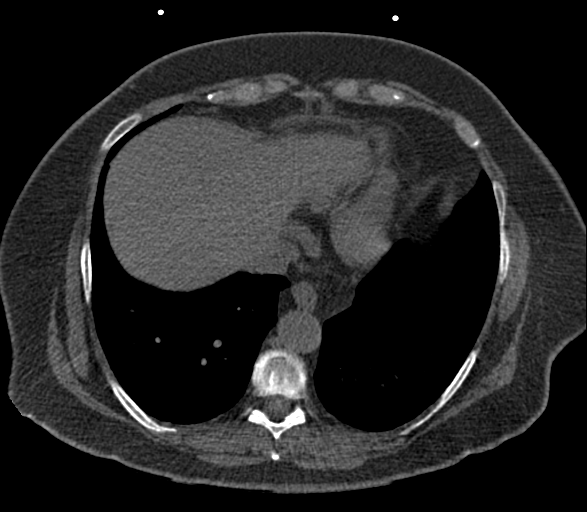
[im 10/59  lung]
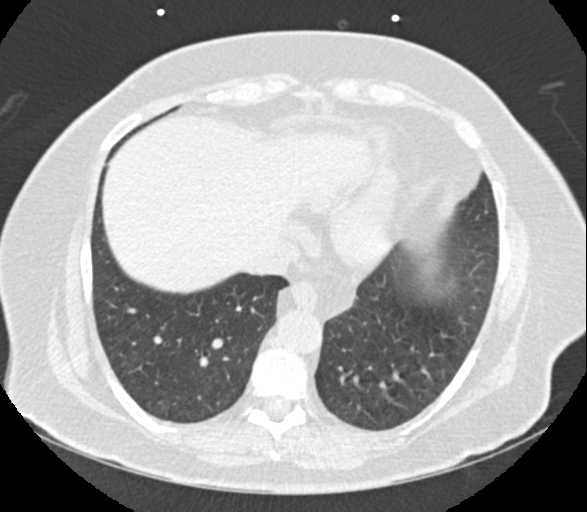
[im 20/59  vessel]
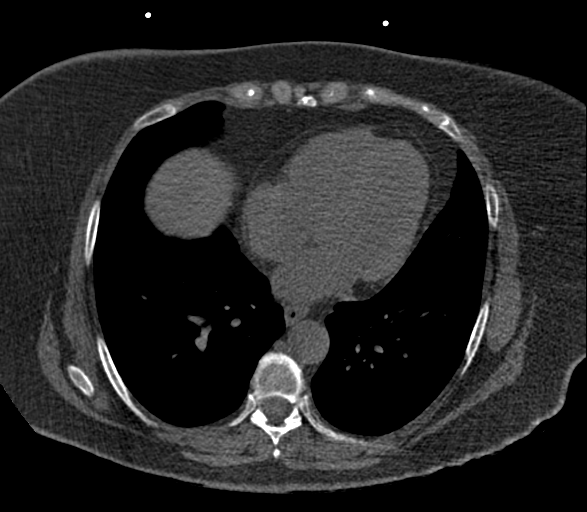
[im 30/59  vessel]
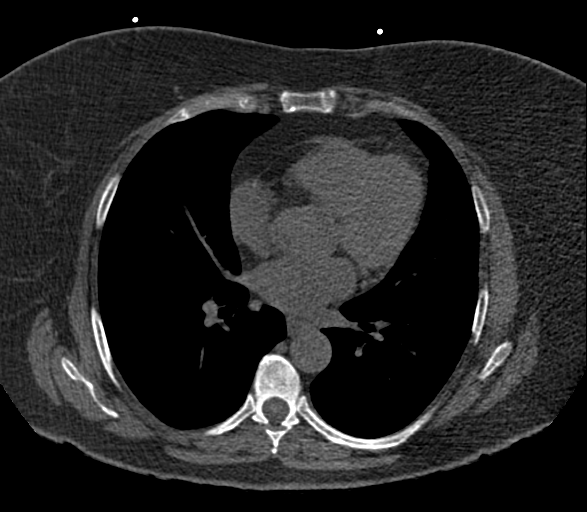
[im 39/59  vessel]
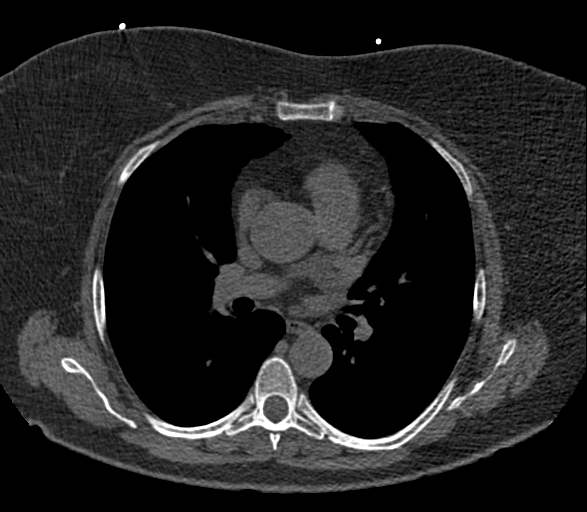
[im 49/59  vessel]
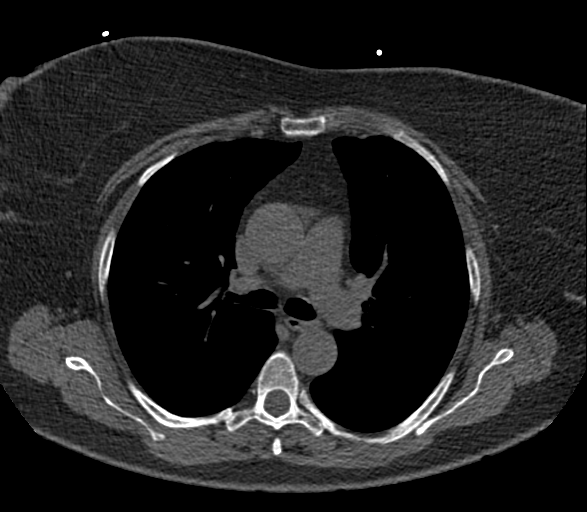
[im 49/59  lung]
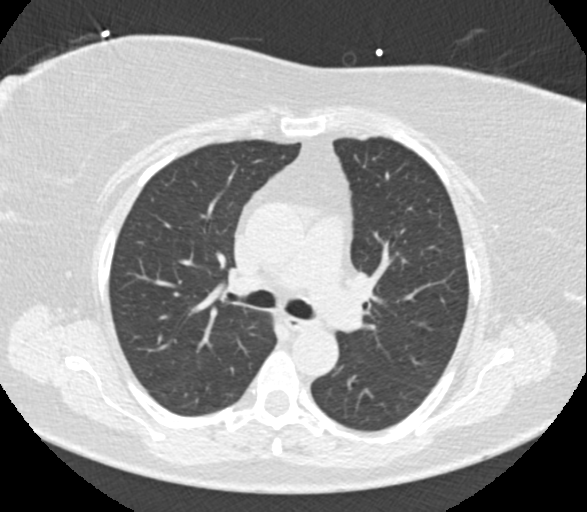

[Series 20: calcium scoring 2.00 br60 bestdiast 70% lungs · axial · 0.58mm/px · z∈[+1645,+1723]mm · 5 of 59 slices shown]
[im 10/59  vessel]
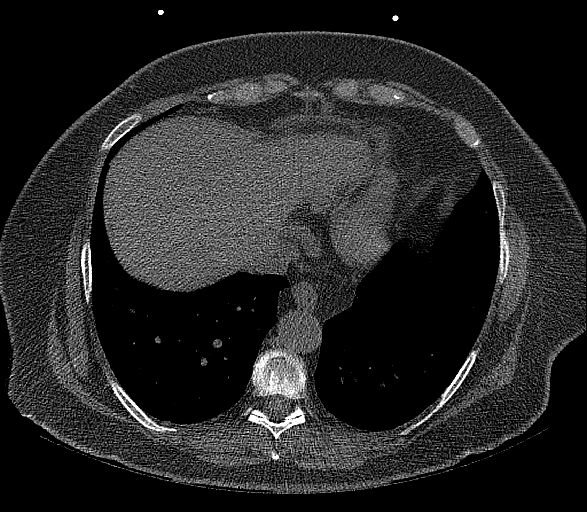
[im 20/59  vessel]
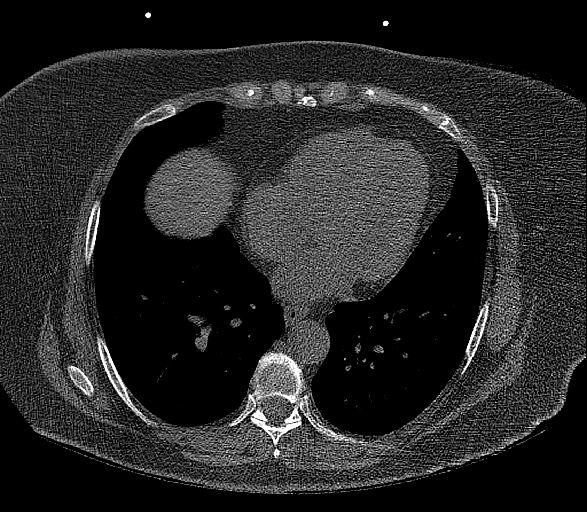
[im 30/59  vessel]
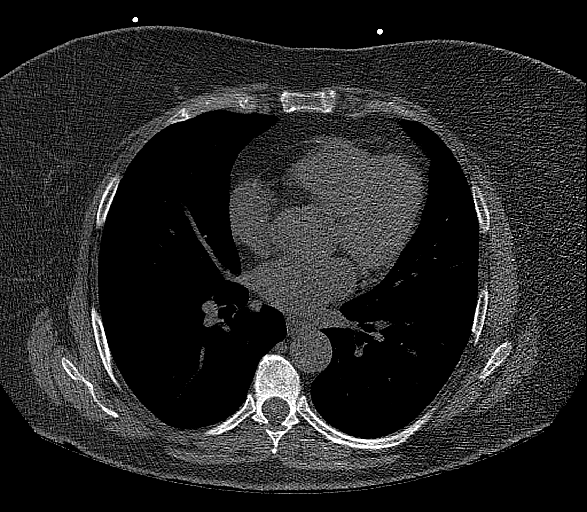
[im 39/59  vessel]
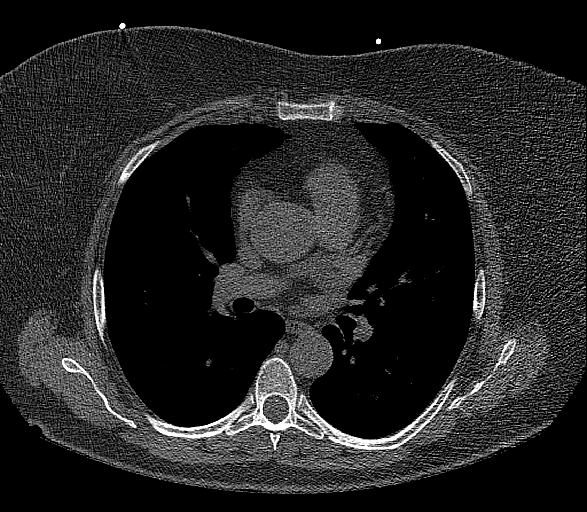
[im 49/59  vessel]
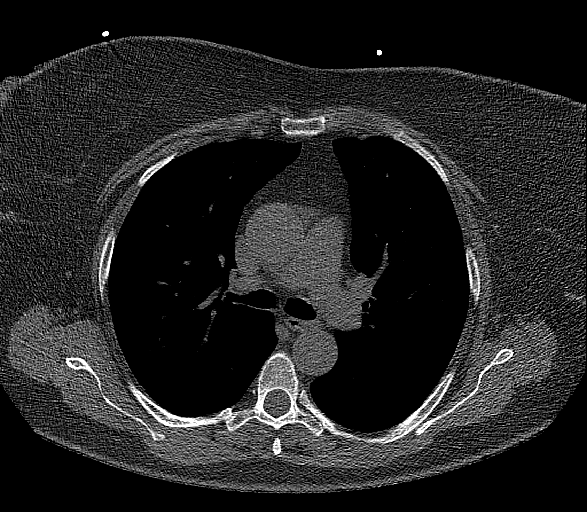

[14 of 20 positions shown; findings below may reference images not displayed]

FINDINGS: CORONARY CALCIUM SCORES:

Left Main: 0

LAD: 0

LCx: 0

RCA: 0

Total Agatston Score: 0

[HOSPITAL] percentile: 0

AORTA MEASUREMENTS:

Ascending Aorta: 36 mm

Descending Aorta: 28 mm

OTHER FINDINGS:

Heart size is normal. Small hiatal hernia. No significant lymph node
enlargement in the visualized mediastinum. Images of the upper
abdomen are unremarkable. No airspace disease or consolidation in
the visualized lungs. No acute bone abnormality.
IMPRESSION: 1. Coronary calcium score is 0.
2. Small hiatal hernia.

## 2021-11-10 ENCOUNTER — Other Ambulatory Visit: Payer: Self-pay | Admitting: Cardiology

## 2021-11-10 DIAGNOSIS — K2100 Gastro-esophageal reflux disease with esophagitis, without bleeding: Secondary | ICD-10-CM

## 2021-11-10 DIAGNOSIS — K219 Gastro-esophageal reflux disease without esophagitis: Secondary | ICD-10-CM

## 2021-11-15 ENCOUNTER — Other Ambulatory Visit: Payer: Self-pay | Admitting: Cardiology

## 2021-11-15 DIAGNOSIS — K2100 Gastro-esophageal reflux disease with esophagitis, without bleeding: Secondary | ICD-10-CM

## 2021-11-15 DIAGNOSIS — K219 Gastro-esophageal reflux disease without esophagitis: Secondary | ICD-10-CM

## 2021-11-16 ENCOUNTER — Other Ambulatory Visit: Payer: Self-pay | Admitting: Cardiology

## 2021-11-16 DIAGNOSIS — K219 Gastro-esophageal reflux disease without esophagitis: Secondary | ICD-10-CM

## 2021-11-16 DIAGNOSIS — K2100 Gastro-esophageal reflux disease with esophagitis, without bleeding: Secondary | ICD-10-CM

## 2021-11-18 ENCOUNTER — Ambulatory Visit: Payer: Medicaid Other

## 2021-11-18 DIAGNOSIS — R072 Precordial pain: Secondary | ICD-10-CM | POA: Diagnosis not present

## 2021-11-22 ENCOUNTER — Telehealth: Payer: Self-pay

## 2021-11-22 DIAGNOSIS — R1319 Other dysphagia: Secondary | ICD-10-CM

## 2021-11-22 NOTE — Telephone Encounter (Signed)
Vladimir Crofts, PA-C  , Salvadore Dom, RN  Please schedule EGD with Dr. Candis Schatz.  Thanks!  Denise Morales

## 2021-11-22 NOTE — Telephone Encounter (Signed)
Patient has been scheduled for EGD in [place] with Dr. Candis Schatz on 12/15/21 at 10:00 am. She is not on any blood thinners or diabetic. Patient is aware & instructions have been provided over the phone and sent to her via mychart. Patient verbalized all understanding, no further questions. Amb ref placed.

## 2021-11-25 ENCOUNTER — Encounter: Payer: Self-pay | Admitting: Cardiology

## 2021-11-25 ENCOUNTER — Ambulatory Visit: Payer: Medicaid Other | Admitting: Cardiology

## 2021-11-25 VITALS — BP 109/81 | HR 80 | Temp 98.0°F | Resp 16 | Ht <= 58 in | Wt 172.0 lb

## 2021-11-25 DIAGNOSIS — I10 Essential (primary) hypertension: Secondary | ICD-10-CM | POA: Diagnosis not present

## 2021-11-25 DIAGNOSIS — R072 Precordial pain: Secondary | ICD-10-CM | POA: Diagnosis not present

## 2021-11-25 DIAGNOSIS — K219 Gastro-esophageal reflux disease without esophagitis: Secondary | ICD-10-CM

## 2021-11-25 DIAGNOSIS — E782 Mixed hyperlipidemia: Secondary | ICD-10-CM | POA: Diagnosis not present

## 2021-11-25 NOTE — Progress Notes (Signed)
ID:  Denise Morales, DOB July 08, 1960, MRN [id number]  PCP:  Everardo Beals, NP  Cardiologist:  Rex Kras, DO, [facility] (established care October 17, 2021)  Date: 11/25/21 Last Office Visit: 10/17/2021  Chief Complaint  Patient presents with   Chest Pain   Follow-up    4 week    HPI  Denise Morales is a 61 y.o. Caucasian female whose past medical history and cardiovascular risk factors include: Hypertension, hyperlipidemia, anxiety, obesity due to excess calories, GERD.  She is referred to the office at the request of Everardo Beals, NP for evaluation of chest pain.  Her symptoms of precordial discomfort appears to be noncardiac but given her risk factors and family history of CAD shared decision was to proceed with coronary calcium score, echo, and GXT.  Coronary calcium score noted total CAC of 0, echocardiogram notes preserved LVEF with normal diastolic function and no significant valvular heart disease, and exercise treadmill stress test was reported to be intermediate risk but stress ECG negative for ischemia.  The intermediate risk was secondary to reduced exercise capacity.  Since last visit she has not had any reoccurrence of precordial discomfort.  She is scheduled to undergo EGD on December 15, 2021.  FUNCTIONAL STATUS: Enjoys doing yard work and active around the house.  No structured exercise program or daily routine  ALLERGIES: Allergies  Allergen Reactions   Hydroxyzine     Other reaction(s): Hallucinations    MEDICATION LIST PRIOR TO VISIT: Current Meds  Medication Sig   allopurinol (ZYLOPRIM) 100 MG tablet Take 100 mg by mouth daily.   busPIRone (BUSPAR) 10 MG tablet Take 10 mg by mouth 3 (three) times daily.   dicyclomine (BENTYL) 10 MG capsule Take 10 mg by mouth every 6 (six) hours as needed for spasms.   escitalopram (LEXAPRO) 20 MG tablet Take 1 tablet (20 mg total) by mouth daily.   levothyroxine (SYNTHROID) 50 MCG tablet Take 50 mcg by mouth daily.    lisinopril-hydrochlorothiazide (ZESTORETIC) 10-12.5 MG tablet Take 1 tablet by mouth daily.   nabumetone (RELAFEN) 500 MG tablet Take 1 tablet (500 mg total) by mouth 2 (two) times daily as needed.   pantoprazole (PROTONIX) 40 MG tablet Take 1 tablet (40 mg total) by mouth 2 (two) times daily before a meal.   pravastatin (PRAVACHOL) 40 MG tablet Take 1 tablet (40 mg total) by mouth daily.   rOPINIRole (REQUIP) 0.25 MG tablet Take 1 tablet (0.25 mg total) by mouth at bedtime.   sucralfate (CARAFATE) 1 g tablet Take 1 tablet (1 g total) by mouth 3 (three) times daily as needed (heart burn).   traMADol (ULTRAM) 50 MG tablet Take 2 tablets (100 mg total) by mouth every 6 (six) hours as needed.   Turmeric 500 MG CAPS Take 500 mg by mouth daily.   Vitamin D-Vitamin K (VITAMIN K2-VITAMIN D3 PO) Take 1 tablet by mouth daily.   zolpidem (AMBIEN) 5 MG tablet Take 5 mg by mouth at bedtime as needed.     PAST MEDICAL HISTORY: Past Medical History:  Diagnosis Date   Anxiety    Arthritis    Depression    Fibromyalgia    GERD (gastroesophageal reflux disease)    Hyperlipidemia    Hypertension    Hypothyroidism    Melanoma ([place])    face   Thyroid disease     PAST SURGICAL HISTORY: Past Surgical History:  Procedure Laterality Date   ABDOMINAL HYSTERECTOMY  1985   CHOLECYSTECTOMY  COLONOSCOPY     SHOULDER ARTHROSCOPY WITH ROTATOR CUFF REPAIR AND SUBACROMIAL DECOMPRESSION Right 08/12/2020   Procedure: RIGHT SHOULDER ARTHROSCOPY WITH ROTATOR CUFF REPAIR AND SUBACROMIAL DECOMPRESSION;  Surgeon: Mcarthur Rossetti, MD;  Location: [facility];  Service: Orthopedics;  Laterality: Right;   TOTAL KNEE ARTHROPLASTY Right 12/24/2020   Procedure: RIGHT TOTAL KNEE ARTHROPLASTY;  Surgeon: Mcarthur Rossetti, MD;  Location: [facility];  Service: Orthopedics;  Laterality: Right;   TOTAL KNEE ARTHROPLASTY Left 07/15/2021   Procedure: LEFT TOTAL KNEE ARTHROPLASTY;  Surgeon: Mcarthur Rossetti, MD;  Location: [facility];  Service: Orthopedics;  Laterality: Left;    FAMILY HISTORY: The patient family history includes Arthritis in her sister; Colon polyps in her father and sister; Dementia in her mother; Diabetes in her father, paternal aunt, sister, and sister; Fibromyalgia in her sister and sister; Healthy in her daughter; Heart Problems in her mother, sister, and sister; Hypertension in her father, mother, sister, sister, and sister; Stroke in her mother.  SOCIAL HISTORY:  The patient  reports that she has never smoked. She has never used smokeless tobacco. She reports that she does not currently use alcohol. She reports that she does not use drugs.  REVIEW OF SYSTEMS: Review of Systems  Cardiovascular:  Negative for chest pain, dyspnea on exertion, leg swelling, orthopnea, palpitations, paroxysmal nocturnal dyspnea and syncope.  Respiratory:  Negative for shortness of breath.    PHYSICAL EXAM:    11/25/2021    1:22 PM 10/20/2021    8:50 AM 10/17/2021    9:12 AM  Vitals with BMI  Height $Remov'4\' 10"'avVhdT$  4' 10.25" $RemoveB'4\' 11"'ZXWeQTXx$   Weight 172 lbs 173 lbs 2 oz 173 lbs 13 oz  BMI 35.96 38.18 29.93  Systolic 716 967 893  Diastolic 81 60 88  Pulse 80 82 94    CONSTITUTIONAL: Well-developed and well-nourished. No acute distress.  SKIN: Skin is warm and dry. No rash noted. No cyanosis. No pallor. No jaundice HEAD: Normocephalic and atraumatic.  EYES: No scleral icterus MOUTH/THROAT: Moist oral membranes.  NECK: No JVD present. No thyromegaly noted. No carotid bruits  CHEST Normal respiratory effort. No intercostal retractions  LUNGS: Clear to auscultation bilaterally.  No stridor. No wheezes. No rales.  CARDIOVASCULAR: Regular rate and rhythm, positive S1-S2, no murmurs rubs or gallops appreciated. ABDOMINAL: Soft, nontender, nondistended, positive bowel sounds in all 4 quadrants, no apparent ascites.  EXTREMITIES: No peripheral edema, warm to touch, 2+ bilateral DP and PT  pulses HEMATOLOGIC: No significant bruising NEUROLOGIC: Oriented to person, place, and time. Nonfocal. Normal muscle tone.  PSYCHIATRIC: Normal mood and affect. Normal behavior. Cooperative  CARDIAC DATABASE: EKG: October 17, 2021 normal sinus rhythm, 80 bpm, without underlying ischemia injury pattern.  Echocardiogram: 10/25/2021:  Normal LV systolic function with visual EF 60-65%. Left ventricle cavity is normal in size. Normal global wall motion. Normal diastolic filling pattern, normal LAP. Mild left ventricular hypertrophy.  Mild (Grade I) aortic regurgitation.  No prior study for comparison.   Stress Testing: Exercise treadmill stress test 11/18/2021: Exercise treadmill stress test performed using Bruce protocol.  Patient reached 5.1 METS, and 91% of age predicted maximum heart rate.  Exercise capacity was low.  No chest pain reported.  Normal heart rate and hemodynamic response. Stress EKG revealed no ischemic changes. Intermediate risk stress test due to poor exercise capacity.   Heart Catheterization: None  CT Cardiac Scoring: 11/10/2021 1. Coronary calcium score is 0. 2. Small hiatal hernia.  LABORATORY DATA: External Labs:  Collected: July 27, 2021. TSH 1.33.  Collected 06/17/2021. BUN 30, creatinine 1.3. eGFR 46 sodium 141, potassium 4.3, chloride 104, bicarb 27 AST 13, ALT 8  Collected: 03/16/2021. Total cholesterol 190, HDL 52, triglycerides 269, LDL 100, non-HDL 138   IMPRESSION:    ICD-10-CM   1. Precordial pain  R07.2     2. Benign hypertension  I10     3. Mixed hyperlipidemia  E78.2     4. Gastroesophageal reflux disease, unspecified whether esophagitis present  K21.9        RECOMMENDATIONS: Folashade Gamboa is a 61 y.o. Caucasian female whose past medical history and cardiac risk factors include: Hypertension, hyperlipidemia, anxiety, obesity due to excess calories, GERD.  Precordial pain No recurrence of precordial pain since last visit. EKG  sinus, nonischemic. Echo: Preserved LVEF, normal diastolic function, no significant valvular heart disease. GXT: Stress ECG negative for ischemia but reported to be intermediate risk due to reduced functional capacity for age. Coronary calcium score: 0 Currently on medical therapy for her underlying GERD.  Scheduled for EGD on 12/15/2021 per patient  Benign hypertension Office blood pressures within acceptable range. Medications reconciled. Currently managed by primary care provider.  Mixed hyperlipidemia Currently on pravastatin. Does not endorse myalgias. Continue current medical therapy.  Gastroesophageal reflux disease, unspecified whether esophagitis present Currently on medical therapy. Scheduled for EGD as noted above.  From a cardiovascular standpoint no additional cardiovascular testing is warranted.  I am actively not managing her comorbidities but given her age will still recommend follow-up in 3 years or sooner if change in clinical status.  In the interim she is deferred back to her PCP for management of other chronic comorbid conditions.  FINAL MEDICATION LIST END OF ENCOUNTER: No orders of the defined types were placed in this encounter.   Medications Discontinued During This Encounter  Medication Reason   baclofen (LIORESAL) 10 MG tablet    benzonatate (TESSALON) 200 MG capsule      Current Outpatient Medications:    allopurinol (ZYLOPRIM) 100 MG tablet, Take 100 mg by mouth daily., Disp: , Rfl:    busPIRone (BUSPAR) 10 MG tablet, Take 10 mg by mouth 3 (three) times daily., Disp: , Rfl:    dicyclomine (BENTYL) 10 MG capsule, Take 10 mg by mouth every 6 (six) hours as needed for spasms., Disp: , Rfl:    escitalopram (LEXAPRO) 20 MG tablet, Take 1 tablet (20 mg total) by mouth daily., Disp: 90 tablet, Rfl: 1   levothyroxine (SYNTHROID) 50 MCG tablet, Take 50 mcg by mouth daily., Disp: , Rfl:    lisinopril-hydrochlorothiazide (ZESTORETIC) 10-12.5 MG tablet, Take 1  tablet by mouth daily., Disp: 90 tablet, Rfl: 1   nabumetone (RELAFEN) 500 MG tablet, Take 1 tablet (500 mg total) by mouth 2 (two) times daily as needed., Disp: 180 tablet, Rfl: 1   pantoprazole (PROTONIX) 40 MG tablet, Take 1 tablet (40 mg total) by mouth 2 (two) times daily before a meal., Disp: 60 tablet, Rfl: 3   pravastatin (PRAVACHOL) 40 MG tablet, Take 1 tablet (40 mg total) by mouth daily., Disp: 90 tablet, Rfl: 1   rOPINIRole (REQUIP) 0.25 MG tablet, Take 1 tablet (0.25 mg total) by mouth at bedtime., Disp: 30 tablet, Rfl: 1   sucralfate (CARAFATE) 1 g tablet, Take 1 tablet (1 g total) by mouth 3 (three) times daily as needed (heart burn)., Disp: 90 tablet, Rfl: 0   traMADol (ULTRAM) 50 MG tablet, Take 2 tablets (100 mg total) by mouth every  6 (six) hours as needed., Disp: 40 tablet, Rfl: 0   Turmeric 500 MG CAPS, Take 500 mg by mouth daily., Disp: , Rfl:    Vitamin D-Vitamin K (VITAMIN K2-VITAMIN D3 PO), Take 1 tablet by mouth daily., Disp: , Rfl:    zolpidem (AMBIEN) 5 MG tablet, Take 5 mg by mouth at bedtime as needed., Disp: , Rfl:   No orders of the defined types were placed in this encounter.   There are no Patient Instructions on file for this visit.   --Continue cardiac medications as reconciled in final medication list. --Return in about 3 years (around 11/25/2024), or if symptoms worsen or fail to improve, for follow up . Or sooner if needed. --Continue follow-up with your primary care physician regarding the management of your other chronic comorbid conditions.  Patient's questions and concerns were addressed to her satisfaction. She voices understanding of the instructions provided during this encounter.   This note was created using a voice recognition software as a result there may be grammatical errors inadvertently enclosed that do not reflect the nature of this encounter. Every attempt is made to correct such errors.  Rex Kras, [place], [facility]  Pager:  [phone] Office: [phone]

## 2021-12-07 ENCOUNTER — Telehealth: Payer: Self-pay | Admitting: Orthopaedic Surgery

## 2021-12-07 ENCOUNTER — Other Ambulatory Visit: Payer: Self-pay | Admitting: Orthopaedic Surgery

## 2021-12-07 MED ORDER — CELECOXIB 200 MG PO CAPS: 200.0000 mg | ORAL_CAPSULE | Freq: Two times a day (BID) | ORAL | 3 refills | Status: DC | PRN

## 2021-12-07 NOTE — Telephone Encounter (Signed)
Patient called. Says the Relafen is not helping her anymore. She would like an increase in MG or something else. Her call back number is [phone]

## 2021-12-08 NOTE — Telephone Encounter (Signed)
Patient aware this was sent in for her  

## 2021-12-12 ENCOUNTER — Other Ambulatory Visit: Payer: Self-pay

## 2021-12-12 DIAGNOSIS — K2100 Gastro-esophageal reflux disease with esophagitis, without bleeding: Secondary | ICD-10-CM

## 2021-12-12 MED ORDER — SUCRALFATE 1 G PO TABS: 1.0000 g | ORAL_TABLET | Freq: Three times a day (TID) | ORAL | 0 refills | Status: DC | PRN

## 2021-12-12 NOTE — Telephone Encounter (Signed)
Generic carafate refilled as pharmacy requested. [place] has EGD upcoming.

## 2021-12-15 ENCOUNTER — Encounter: Payer: Self-pay | Admitting: Gastroenterology

## 2021-12-15 ENCOUNTER — Ambulatory Visit (AMBULATORY_SURGERY_CENTER): Payer: Medicaid Other | Admitting: Gastroenterology

## 2021-12-15 VITALS — BP 111/73 | HR 67 | Temp 98.0°F | Resp 21 | Ht <= 58 in | Wt 173.0 lb

## 2021-12-15 DIAGNOSIS — R1314 Dysphagia, pharyngoesophageal phase: Secondary | ICD-10-CM | POA: Diagnosis not present

## 2021-12-15 DIAGNOSIS — R1319 Other dysphagia: Secondary | ICD-10-CM | POA: Diagnosis not present

## 2021-12-15 DIAGNOSIS — K2100 Gastro-esophageal reflux disease with esophagitis, without bleeding: Secondary | ICD-10-CM | POA: Diagnosis not present

## 2021-12-15 DIAGNOSIS — F419 Anxiety disorder, unspecified: Secondary | ICD-10-CM | POA: Diagnosis not present

## 2021-12-15 DIAGNOSIS — K222 Esophageal obstruction: Secondary | ICD-10-CM

## 2021-12-15 DIAGNOSIS — M797 Fibromyalgia: Secondary | ICD-10-CM | POA: Diagnosis not present

## 2021-12-15 DIAGNOSIS — K2970 Gastritis, unspecified, without bleeding: Secondary | ICD-10-CM | POA: Diagnosis not present

## 2021-12-15 DIAGNOSIS — F32A Depression, unspecified: Secondary | ICD-10-CM | POA: Diagnosis not present

## 2021-12-15 DIAGNOSIS — E039 Hypothyroidism, unspecified: Secondary | ICD-10-CM | POA: Diagnosis not present

## 2021-12-15 DIAGNOSIS — I10 Essential (primary) hypertension: Secondary | ICD-10-CM | POA: Diagnosis not present

## 2021-12-15 MED ORDER — SODIUM CHLORIDE 0.9 % IV SOLN: 500.0000 mL | Freq: Once | INTRAVENOUS | Status: DC

## 2021-12-15 NOTE — Patient Instructions (Signed)
YOU HAD AN ENDOSCOPIC PROCEDURE TODAY: Refer to the procedure report and other information in the discharge instructions given to you for any specific questions about what was found during the examination. If this information does not answer your questions, please call [place] office at [phone] to clarify.   YOU SHOULD EXPECT: Some feelings of bloating in the abdomen. Passage of more gas than usual. Walking can help get rid of the air that was put into your GI tract during the procedure and reduce the bloating. If you had a lower endoscopy (such as a colonoscopy or flexible sigmoidoscopy) you may notice spotting of blood in your stool or on the toilet paper. Some abdominal soreness may be present for a day or two, also.  DIET: Your first meal following the procedure should be a light meal and then it is ok to progress to your normal diet. A half-sandwich or bowl of soup is an example of a good first meal. Heavy or fried foods are harder to digest and may make you feel nauseous or bloated. Drink plenty of fluids but you should avoid alcoholic beverages for 24 hours. If you had a esophageal dilation, please see attached instructions for diet.    ACTIVITY: Your care partner should take you home directly after the procedure. You should plan to take it easy, moving slowly for the rest of the day. You can resume normal activity the day after the procedure however YOU SHOULD NOT DRIVE, use power tools, machinery or perform tasks that involve climbing or major physical exertion for 24 hours (because of the sedation medicines used during the test).   SYMPTOMS TO REPORT IMMEDIATELY: A gastroenterologist can be reached at any hour. Please call [phone]  for any of the following symptoms:   Following upper endoscopy (EGD, EUS, ERCP, esophageal dilation) Vomiting of blood or coffee ground material  New, significant abdominal pain  New, significant chest pain or pain under the shoulder blades  Painful or  persistently difficult swallowing  New shortness of breath  Black, tarry-looking or red, bloody stools  FOLLOW UP:  If any biopsies were taken you will be contacted by phone or by letter within the next 1-3 weeks. Call [phone]  if you have not heard about the biopsies in 3 weeks.  Please also call with any specific questions about appointments or follow up tests.  

## 2021-12-15 NOTE — Op Note (Signed)
[place] Patient Name: Denise Morales Procedure Date: 12/15/2021 9:50 AM MRN: [id number] Endoscopist: Nicki Reaper E. Candis Schatz , MD Age: 61 Referring MD:  Date of Birth: Nov 06, 1960 Gender: Female Account #: [id number] Procedure:                Upper GI endoscopy Indications:              Generalized abdominal pain, Esophageal reflux                            symptoms that persist despite appropriate therapy Medicines:                Monitored Anesthesia Care Procedure:                Pre-Anesthesia Assessment:                           - Prior to the procedure, a History and Physical                            was performed, and patient medications and                            allergies were reviewed. The patient's tolerance of                            previous anesthesia was also reviewed. The risks                            and benefits of the procedure and the sedation                            options and risks were discussed with the patient.                            All questions were answered, and informed consent                            was obtained. Prior Anticoagulants: The patient has                            taken no previous anticoagulant or antiplatelet                            agents. ASA Grade Assessment: II - A patient with                            mild systemic disease. After reviewing the risks                            and benefits, the patient was deemed in                            satisfactory condition to undergo the procedure.  After obtaining informed consent, the endoscope was                            passed under direct vision. Throughout the                            procedure, the patient's blood pressure, pulse, and                            oxygen saturations were monitored continuously. The                            Endoscope was introduced through the mouth, and                             advanced to the third part of duodenum. The upper                            GI endoscopy was accomplished without difficulty.                            The patient tolerated the procedure well. Scope In: Scope Out: Findings:                 The examined portions of the nasopharynx,                            oropharynx and larynx were normal.                           One benign-appearing, intrinsic mild stenosis was                            found at the gastroesophageal junction. This                            stenosis measured 1.1 cm (inner diameter) x less                            than one cm (in length). The stenosis was                            traversed. A TTS dilator was passed through the                            scope. Dilation with a 13.5-14.5-15.5 mm balloon                            dilator was performed to 15.5 mm. The dilation site                            was examined and showed no change. Estimated blood  loss: none.                           The exam of the esophagus was otherwise normal.                           A 5 cm hiatal hernia was present.                           The entire examined stomach was normal. Biopsies                            were taken with a cold forceps for Helicobacter                            pylori testing. Estimated blood loss was minimal.                           A mild stenosis was found at the pylorus. This was                            traversed creating a small amount of bleeding which                            stopped spontaneously. Estimated blood loss was                            minimal.                           The examined duodenum was normal. Complications:            No immediate complications. Estimated Blood Loss:     Estimated blood loss was minimal. Impression:               - The examined portions of the nasopharynx,                            oropharynx and larynx were  normal.                           - Benign-appearing esophageal stenosis. Dilated.                           - 5 cm hiatal hernia.                           - Normal stomach. Biopsied.                           - Gastric stenosis was found at the pylorus.                           - Normal examined duodenum. Recommendation:           - Patient has a contact number available for  emergencies. The signs and symptoms of potential                            delayed complications were discussed with the                            patient. Return to normal activities tomorrow.                            Written discharge instructions were provided to the                            patient.                           - Resume previous diet.                           - Continue present medications.                           - Await pathology results.                           - Consider hernia repair/fundoplication if symptoms                            continue despite medical therapy. Deeann Servidio E. Candis Schatz, MD 12/15/2021 10:25:16 AM This report has been signed electronically.

## 2021-12-15 NOTE — Progress Notes (Signed)
[facility] History and Physical   Primary Care Physician:  Everardo Beals, NP   Reason for Procedure:   GERD symptoms, dysphagia  Plan:    EGD with possible dilation     HPI: Denise Morales is a 61 y.o. female undergoing EGD to evaluate persistent GERD symptoms, abdominal pain and dysphagia.  Symptoms are improved, but persistent, on BID Protonix.    Past Medical History:  Diagnosis Date   Allergy    Anxiety    Arthritis    Depression    Fibromyalgia    GERD (gastroesophageal reflux disease)    Hyperlipidemia    Hypertension    Hypothyroidism    Melanoma ([place])    face   Thyroid disease     Past Surgical History:  Procedure Laterality Date   ABDOMINAL HYSTERECTOMY  1985   CHOLECYSTECTOMY     COLONOSCOPY     SHOULDER ARTHROSCOPY WITH ROTATOR CUFF REPAIR AND SUBACROMIAL DECOMPRESSION Right 08/12/2020   Procedure: RIGHT SHOULDER ARTHROSCOPY WITH ROTATOR CUFF REPAIR AND SUBACROMIAL DECOMPRESSION;  Surgeon: Mcarthur Rossetti, MD;  Location: [facility];  Service: Orthopedics;  Laterality: Right;   TOTAL KNEE ARTHROPLASTY Right 12/24/2020   Procedure: RIGHT TOTAL KNEE ARTHROPLASTY;  Surgeon: Mcarthur Rossetti, MD;  Location: [facility];  Service: Orthopedics;  Laterality: Right;   TOTAL KNEE ARTHROPLASTY Left 07/15/2021   Procedure: LEFT TOTAL KNEE ARTHROPLASTY;  Surgeon: Mcarthur Rossetti, MD;  Location: [facility];  Service: Orthopedics;  Laterality: Left;    Prior to Admission medications   Medication Sig Start Date End Date Taking? Authorizing Provider  allopurinol (ZYLOPRIM) 100 MG tablet Take 100 mg by mouth daily. 03/28/21  Yes [provider]  busPIRone (BUSPAR) 10 MG tablet Take 10 mg by mouth 3 (three) times daily.   Yes [provider]  celecoxib (CELEBREX) 200 MG capsule Take 1 capsule (200 mg total) by mouth 2 (two) times daily between meals as needed. 12/07/21  Yes Mcarthur Rossetti, MD   dicyclomine (BENTYL) 10 MG capsule Take 10 mg by mouth every 6 (six) hours as needed for spasms. 06/25/21  Yes [provider]  escitalopram (LEXAPRO) 20 MG tablet Take 1 tablet (20 mg total) by mouth daily. 12/01/20  Yes Kerin Perna, NP  levothyroxine (SYNTHROID) 50 MCG tablet Take 50 mcg by mouth daily. 03/23/21  Yes [provider]  lisinopril-hydrochlorothiazide (ZESTORETIC) 10-12.5 MG tablet Take 1 tablet by mouth daily. 12/01/20  Yes Kerin Perna, NP  pantoprazole (PROTONIX) 40 MG tablet Take 1 tablet (40 mg total) by mouth 2 (two) times daily before a meal. 10/20/21 02/17/22 Yes Vicie Mutters R, PA-C  pravastatin (PRAVACHOL) 40 MG tablet Take 1 tablet (40 mg total) by mouth daily. 12/01/20  Yes Kerin Perna, NP  rOPINIRole (REQUIP) 0.25 MG tablet Take 1 tablet (0.25 mg total) by mouth at bedtime. 10/11/21  Yes Mcarthur Rossetti, MD  sucralfate (CARAFATE) 1 g tablet Take 1 tablet (1 g total) by mouth 3 (three) times daily as needed (heart burn). 12/12/21  Yes Vicie Mutters R, PA-C  traMADol (ULTRAM) 50 MG tablet Take 2 tablets (100 mg total) by mouth every 6 (six) hours as needed. 09/22/21  Yes Mcarthur Rossetti, MD  Turmeric 500 MG CAPS Take 500 mg by mouth daily.   Yes [provider]  Vitamin D-Vitamin K (VITAMIN K2-VITAMIN D3 PO) Take 1 tablet by mouth daily.   Yes [provider]  zolpidem (AMBIEN) 5 MG tablet Take  5 mg by mouth at bedtime as needed. 11/17/21  Yes [provider]    Current Outpatient Medications  Medication Sig Dispense Refill   allopurinol (ZYLOPRIM) 100 MG tablet Take 100 mg by mouth daily.     busPIRone (BUSPAR) 10 MG tablet Take 10 mg by mouth 3 (three) times daily.     celecoxib (CELEBREX) 200 MG capsule Take 1 capsule (200 mg total) by mouth 2 (two) times daily between meals as needed. 60 capsule 3   dicyclomine (BENTYL) 10 MG capsule Take 10 mg by mouth every 6 (six) hours as needed for  spasms.     escitalopram (LEXAPRO) 20 MG tablet Take 1 tablet (20 mg total) by mouth daily. 90 tablet 1   levothyroxine (SYNTHROID) 50 MCG tablet Take 50 mcg by mouth daily.     lisinopril-hydrochlorothiazide (ZESTORETIC) 10-12.5 MG tablet Take 1 tablet by mouth daily. 90 tablet 1   pantoprazole (PROTONIX) 40 MG tablet Take 1 tablet (40 mg total) by mouth 2 (two) times daily before a meal. 60 tablet 3   pravastatin (PRAVACHOL) 40 MG tablet Take 1 tablet (40 mg total) by mouth daily. 90 tablet 1   rOPINIRole (REQUIP) 0.25 MG tablet Take 1 tablet (0.25 mg total) by mouth at bedtime. 30 tablet 1   sucralfate (CARAFATE) 1 g tablet Take 1 tablet (1 g total) by mouth 3 (three) times daily as needed (heart burn). 90 tablet 0   traMADol (ULTRAM) 50 MG tablet Take 2 tablets (100 mg total) by mouth every 6 (six) hours as needed. 40 tablet 0   Turmeric 500 MG CAPS Take 500 mg by mouth daily.     Vitamin D-Vitamin K (VITAMIN K2-VITAMIN D3 PO) Take 1 tablet by mouth daily.     zolpidem (AMBIEN) 5 MG tablet Take 5 mg by mouth at bedtime as needed.     Current Facility-Administered Medications  Medication Dose Route Frequency Provider Last Rate Last Admin   0.9 %  sodium chloride infusion  500 mL Intravenous Once Daryel November, MD        Allergies as of 12/15/2021 - Review Complete 12/15/2021  Allergen Reaction Noted   Hydroxyzine  10/17/2021    Family History  Problem Relation Age of Onset   Dementia Mother    Heart Problems Mother    Stroke Mother    Hypertension Mother    Hypertension Father    Colon polyps Father    Diabetes Father    Hypertension Sister    Diabetes Sister        borderline   Fibromyalgia Sister    Hypertension Sister    Colon polyps Sister    Diabetes Sister    Fibromyalgia Sister    Heart Problems Sister    Arthritis Sister    Hypertension Sister    Heart Problems Sister    Diabetes Paternal Aunt    Healthy Daughter    Colon cancer Neg Hx    Esophageal  cancer Neg Hx    Rectal cancer Neg Hx    Stomach cancer Neg Hx     Social History   Socioeconomic History   Marital status: Married    Spouse name: Not on file   Number of children: Not on file   Years of education: Not on file   Highest education level: Not on file  Occupational History   Not on file  Tobacco Use   Smoking status: Never   Smokeless tobacco: Never  Vaping Use  Vaping Use: Never used  Substance and Sexual Activity   Alcohol use: Not Currently   Drug use: Never   Sexual activity: Not Currently  Other Topics Concern   Not on file  Social History Narrative   Not on file   Social Determinants of Health   Financial Resource Strain: Not on file  Food Insecurity: Not on file  Transportation Needs: Not on file  Physical Activity: Not on file  Stress: Not on file  Social Connections: Not on file  Intimate Partner Violence: Not on file    Review of Systems:  All other review of systems negative except as mentioned in the HPI.  Physical Exam: Vital signs BP (!) 150/95   Pulse 72   Temp 98 F (36.7 C)   Resp 17   Ht '4\' 10"'$  (1.473 m)   Wt 173 lb (78.5 kg)   SpO2 96%   BMI 36.16 kg/m   General:   Alert,  Well-developed, well-nourished, pleasant and cooperative in NAD Airway:  Mallampati 2 Lungs:  Clear throughout to auscultation.   Heart:  Regular rate and rhythm; no murmurs, clicks, rubs,  or gallops. Abdomen:  Soft, nontender and nondistended. Normal bowel sounds.   Neuro/Psych:  Normal mood and affect. A and O x 3   Estell Puccini E. Candis Schatz, MD [facility]

## 2021-12-15 NOTE — Progress Notes (Signed)
Pt's states no medical or surgical changes since previsit or office visit. 

## 2022-01-05 ENCOUNTER — Other Ambulatory Visit: Payer: Self-pay | Admitting: Physician Assistant

## 2022-01-05 ENCOUNTER — Other Ambulatory Visit: Payer: Self-pay | Admitting: Orthopaedic Surgery

## 2022-01-05 DIAGNOSIS — K2100 Gastro-esophageal reflux disease with esophagitis, without bleeding: Secondary | ICD-10-CM

## 2022-01-29 ENCOUNTER — Other Ambulatory Visit: Payer: Self-pay | Admitting: Physician Assistant

## 2022-01-29 DIAGNOSIS — K2100 Gastro-esophageal reflux disease with esophagitis, without bleeding: Secondary | ICD-10-CM

## 2022-02-15 DIAGNOSIS — R7303 Prediabetes: Secondary | ICD-10-CM | POA: Diagnosis not present

## 2022-02-15 DIAGNOSIS — E785 Hyperlipidemia, unspecified: Secondary | ICD-10-CM | POA: Diagnosis not present

## 2022-02-15 DIAGNOSIS — Z0000 Encounter for general adult medical examination without abnormal findings: Secondary | ICD-10-CM | POA: Diagnosis not present

## 2022-02-15 DIAGNOSIS — I10 Essential (primary) hypertension: Secondary | ICD-10-CM | POA: Diagnosis not present

## 2022-02-28 ENCOUNTER — Other Ambulatory Visit: Payer: Self-pay | Admitting: Physician Assistant

## 2022-02-28 DIAGNOSIS — K2100 Gastro-esophageal reflux disease with esophagitis, without bleeding: Secondary | ICD-10-CM

## 2022-02-28 DIAGNOSIS — K219 Gastro-esophageal reflux disease without esophagitis: Secondary | ICD-10-CM

## 2022-03-10 ENCOUNTER — Ambulatory Visit: Payer: Medicaid Other | Admitting: Podiatry

## 2022-03-10 ENCOUNTER — Ambulatory Visit: Payer: Medicaid Other

## 2022-03-10 DIAGNOSIS — M21611 Bunion of right foot: Secondary | ICD-10-CM | POA: Diagnosis not present

## 2022-03-10 DIAGNOSIS — Z01818 Encounter for other preprocedural examination: Secondary | ICD-10-CM | POA: Diagnosis not present

## 2022-03-10 DIAGNOSIS — M2041 Other hammer toe(s) (acquired), right foot: Secondary | ICD-10-CM

## 2022-03-10 DIAGNOSIS — M24574 Contracture, right foot: Secondary | ICD-10-CM

## 2022-03-10 NOTE — Progress Notes (Signed)
Subjective:  Patient ID: Denise Morales, female    DOB: 17-Feb-1961,  MRN: [id number]  No chief complaint on file.   61 y.o. female presents with the above complaint.  Patient presents with complaint of right bunion deformity severe nature.  Patient states been present for quite some time is progressive gotten worse.  Hurts with ambulation hurts with pressure.  She would like for me to evaluate it and discuss surgical.  She has failed all conservative treatment options she was treated conservatively by Dr. Paulla Dolly.  At this time patient will benefit from surgical intervention.  She states it hurts with ambulation hurts with pressure pain scale is 8 out of 10 sharp shooting in nature.  Review of Systems: Negative except as noted in the HPI. Denies N/V/F/Ch.  Past Medical History:  Diagnosis Date   Allergy    Anxiety    Arthritis    Depression    Fibromyalgia    GERD (gastroesophageal reflux disease)    Hyperlipidemia    Hypertension    Hypothyroidism    Melanoma ([place])    face   Thyroid disease     Current Outpatient Medications:    allopurinol (ZYLOPRIM) 100 MG tablet, Take 100 mg by mouth daily., Disp: , Rfl:    busPIRone (BUSPAR) 10 MG tablet, Take 10 mg by mouth 3 (three) times daily., Disp: , Rfl:    celecoxib (CELEBREX) 200 MG capsule, Take 1 capsule (200 mg total) by mouth 2 (two) times daily between meals as needed., Disp: 60 capsule, Rfl: 3   dicyclomine (BENTYL) 10 MG capsule, Take 10 mg by mouth every 6 (six) hours as needed for spasms., Disp: , Rfl:    escitalopram (LEXAPRO) 20 MG tablet, Take 1 tablet (20 mg total) by mouth daily., Disp: 90 tablet, Rfl: 1   levothyroxine (SYNTHROID) 50 MCG tablet, Take 50 mcg by mouth daily., Disp: , Rfl:    lisinopril-hydrochlorothiazide (ZESTORETIC) 10-12.5 MG tablet, Take 1 tablet by mouth daily., Disp: 90 tablet, Rfl: 1   nabumetone (RELAFEN) 500 MG tablet, Take 1 tablet by mouth twice daily as needed, Disp: 180 tablet, Rfl: 0    pantoprazole (PROTONIX) 40 MG tablet, TAKE 1 TABLET BY MOUTH TWICE DAILY BEFORE A MEAL, Disp: 60 tablet, Rfl: 0   pravastatin (PRAVACHOL) 40 MG tablet, Take 1 tablet (40 mg total) by mouth daily., Disp: 90 tablet, Rfl: 1   rOPINIRole (REQUIP) 0.25 MG tablet, Take 1 tablet (0.25 mg total) by mouth at bedtime., Disp: 30 tablet, Rfl: 1   sucralfate (CARAFATE) 1 g tablet, TAKE 1 TABLET BY MOUTH THREE TIMES DAILY AS NEEDED FOR HEART BURN., Disp: 90 tablet, Rfl: 0   traMADol (ULTRAM) 50 MG tablet, Take 2 tablets (100 mg total) by mouth every 6 (six) hours as needed., Disp: 40 tablet, Rfl: 0   Turmeric 500 MG CAPS, Take 500 mg by mouth daily., Disp: , Rfl:    Vitamin D-Vitamin K (VITAMIN K2-VITAMIN D3 PO), Take 1 tablet by mouth daily., Disp: , Rfl:    zolpidem (AMBIEN) 5 MG tablet, Take 5 mg by mouth at bedtime as needed., Disp: , Rfl:   Social History   Tobacco Use  Smoking Status Never  Smokeless Tobacco Never    Allergies  Allergen Reactions   Hydroxyzine     Other reaction(s): Hallucinations   Objective:  There were no vitals filed for this visit. There is no height or weight on file to calculate BMI. Constitutional Well developed. Well nourished.  Vascular Dorsalis  pedis pulses palpable bilaterally. Posterior tibial pulses palpable bilaterally. Capillary refill normal to all digits.  No cyanosis or clubbing noted. Pedal hair growth normal.  Neurologic Normal speech. Oriented to person, place, and time. Epicritic sensation to light touch grossly present bilaterally.  Dermatologic Nails well groomed and normal in appearance. No open wounds. No skin lesions.  Orthopedic: Normal joint ROM without pain or crepitus bilaterally. Hallux abductovalgus deformity present no intra-articular pain noted is a track bound not a tracking deformity. Left 1st MPJ diminished range of motion. Left 1st TMT without gross hypermobility. Right 1st MPJ diminished range of motion  Right 1st TMT with  gross hypermobility. Lesser digital contractures present right.  Second and third digit hammertoe contracture with somewhat flexible lady noted.   Radiographs: Taken and reviewed. Hallux abductovalgus deformity present. Severe bunion deformity noted to the right foot with hallux valgus increase in intermetatarsal angle.  Increase in hallux valgus angle.  No osteoarthritic changes noted at the first metatarsophalangeal joint.  Hammertoe contractures of second and third noted.  Lateral deviation slightly noted.  No osteoarthritic changes noted.  Some underlying met adductus present.  Metatarsal parabola within normal limits.  Assessment:   1. Bunion of right foot   2. Hammertoe of right foot   3. Joint contracture of foot, right   4. Encounter for preoperative examination for general surgical procedure    Plan:  Patient was evaluated and treated and all questions answered.  Hallux abductovalgus deformity, right with hammertoe contracture of second and third with joint contracture -XR as above. -Patient has failed all conservative therapy and wishes to proceed with surgical intervention. All risks, benefits, and alternatives discussed with patient. No guarantees given. Consent reviewed and signed by patient. Post-op course explained at length. -Planned procedures: Right Lapidus/Lapiplasty with second and third hammertoe arthroplasty with joint capsulotomy with fixation -Risk factors: Age -I explained the patient my preoperative intra postop plan in extensive detail she would like to proceed despite all the risks.  She will have to be nonweightbearing to the right foot for 3 weeks.  She states understanding. -Informed surgical risk consent was reviewed and read aloud to the patient.  I reviewed the films.  I have discussed my findings with the patient in great detail.  I have discussed all risks including but not limited to infection, stiffness, scarring, limp, disability, deformity, damage to  blood vessels and nerves, numbness, poor healing, need for braces, arthritis, chronic pain, amputation, death.  All benefits and realistic expectations discussed in great detail.  I have made no promises as to the outcome.  I have provided realistic expectations.  I have offered the patient a 2nd opinion, which they have declined and assured me they preferred to proceed despite the risks   No follow-ups on file.

## 2022-03-27 ENCOUNTER — Ambulatory Visit (INDEPENDENT_AMBULATORY_CARE_PROVIDER_SITE_OTHER): Payer: Medicaid Other

## 2022-03-27 ENCOUNTER — Ambulatory Visit: Payer: Medicaid Other | Admitting: Orthopaedic Surgery

## 2022-03-27 ENCOUNTER — Encounter: Payer: Self-pay | Admitting: Orthopaedic Surgery

## 2022-03-27 DIAGNOSIS — Z96652 Presence of left artificial knee joint: Secondary | ICD-10-CM | POA: Diagnosis not present

## 2022-03-27 NOTE — Progress Notes (Signed)
The patient is now almost 33-monthstatus post a left total knee arthroplasty.  We replaced her right knee last year.  She says both knees are doing great.  She is an active 61year old female.  On exam both knees and showed no effusion.  Both knees have excellent range of motion and are ligamentously stable.  Standing AP shows both knees as well as a lateral of the left knee.  There are no complicating features of the knee replacements.  This point follow-up can be as needed for her knees.  We talked about strengthening her quad muscles and the things that we need to bring her back for her knees.  She knows we can see her for anything else orthopedic.  All questions and concerns were answered and addressed.

## 2022-04-13 ENCOUNTER — Other Ambulatory Visit: Payer: Self-pay | Admitting: Physician Assistant

## 2022-04-13 DIAGNOSIS — K2100 Gastro-esophageal reflux disease with esophagitis, without bleeding: Secondary | ICD-10-CM

## 2022-04-13 DIAGNOSIS — K219 Gastro-esophageal reflux disease without esophagitis: Secondary | ICD-10-CM

## 2022-04-30 DIAGNOSIS — R55 Syncope and collapse: Secondary | ICD-10-CM | POA: Diagnosis not present

## 2022-04-30 DIAGNOSIS — R1030 Lower abdominal pain, unspecified: Secondary | ICD-10-CM | POA: Diagnosis not present

## 2022-05-09 ENCOUNTER — Telehealth: Payer: Self-pay | Admitting: Physical Medicine and Rehabilitation

## 2022-05-09 NOTE — Telephone Encounter (Signed)
Tillie Rung from [facility] called requesting a call back concerning a referral for pt to see Dr. Ernestina Patches. Please call Tillie Rung back at [phone] when you receive referral.

## 2022-05-12 ENCOUNTER — Encounter: Payer: Self-pay | Admitting: Physical Medicine and Rehabilitation

## 2022-05-12 ENCOUNTER — Ambulatory Visit: Payer: Medicaid Other | Admitting: Physical Medicine and Rehabilitation

## 2022-05-12 VITALS — BP 137/81 | HR 83

## 2022-05-12 DIAGNOSIS — M797 Fibromyalgia: Secondary | ICD-10-CM

## 2022-05-12 DIAGNOSIS — M545 Low back pain, unspecified: Secondary | ICD-10-CM | POA: Diagnosis not present

## 2022-05-12 DIAGNOSIS — M7918 Myalgia, other site: Secondary | ICD-10-CM

## 2022-05-12 DIAGNOSIS — G8929 Other chronic pain: Secondary | ICD-10-CM

## 2022-05-12 DIAGNOSIS — M47816 Spondylosis without myelopathy or radiculopathy, lumbar region: Secondary | ICD-10-CM

## 2022-05-12 MED ORDER — TRAMADOL HCL 50 MG PO TABS: 50.0000 mg | ORAL_TABLET | Freq: Three times a day (TID) | ORAL | 0 refills | Status: DC | PRN

## 2022-05-12 NOTE — Progress Notes (Unsigned)
Denise Morales - 61 y.o. female MRN [id number]  Date of birth: [birth year]  Office Visit Note: Visit Date: 05/12/2022 PCP: Everardo Beals, NP Referred by: Everardo Beals, NP  Subjective: Chief Complaint  Patient presents with   Lower Back - Pain   HPI: Denise Morales is a 61 y.o. female who comes in today per the request of Everardo Beals, FNP for evaluation of chronic, worsening and severe bilateral lower back pain.  We have treated her husband Gwyndolyn Saxon in the past. She was previously treated by Dr. Eunice Blase. Pain ongoing for several months and is exacerbated by movement and activity.  Severe pain when moving from sitting to standing position, reports it takes her several minutes to "get going" after standing up.  She describes her pain as a sore, aching and stiff sensation, currently rates as 8 out of 10.  Some relief of pain with home exercise regimen, rest and use of medications. She has attended formal physical therapy with our in house team in 2022 with some relief of pain. Lumbar MRI imaging from 2021 exhibits advanced hypertrophic facet degenerative changes at the L4-5 and L5-S1 levels, no foraminal/spinal canal stenosis noted. No history of lumbar surgery/injections. History of bilateral knee arthroplasty with Dr. Jean Rosenthal. Patient denies focal weakness, numbness and tingling. Patient denies recent trauma or falls.   Incidentally, patient also mentioned chronic issues with right sided thoracic back pain. Pain ongoing for several months and worsens with movement. She describes as a soreness/tightness sensation. Some relief of pain with massage and rest. She does carry diagnosis of fibromyalgia and reports issues with generalized body pain/aches for several years.      {Oswestry Disability Score:26558}  Review of Systems  Musculoskeletal:  Positive for back pain and myalgias.  Neurological:  Negative for tingling, sensory change, focal weakness and  weakness.  All other systems reviewed and are negative.  Otherwise per HPI.  Assessment & Plan: Visit Diagnoses:    ICD-10-CM   1. Chronic bilateral low back pain without sciatica  M54.50 Ambulatory referral to Physical Medicine Rehab   G89.29     2. Spondylosis without myelopathy or radiculopathy, lumbar region  M47.816 Ambulatory referral to Physical Medicine Rehab    3. Facet hypertrophy of lumbar region  M47.816 Ambulatory referral to Physical Medicine Rehab    4. Fibromyalgia  M79.7 Ambulatory referral to Physical Medicine Rehab    5. Myofascial pain syndrome  M79.18 Ambulatory referral to Physical Medicine Rehab       Plan: Findings:  1.  Chronic, worsening and severe bilateral axial back pain.  No radicular symptoms.  Patient continues to have severe pain despite good conservative therapy such as formal physical therapy, home exercise regimen, rest and use of medications.  Patient's clinical presentation and exam are consistent with facet mediated pain.  Severe pain noted with lumbar extension upon exam today.  There are advanced degenerative facet changes at the levels of L4-L5 and L5-S1 on MRI imaging from 2021.  Next step is to perform diagnostic bilateral L4-L5 and L5-S1 medial branch blocks under fluoroscopic guidance.  If patient gets good relief of pain with diagnostic medial branch blocks we did discuss the possibility of longer sustained pain relief with radiofrequency ablation.  I did provide patient with educational literature on radiofrequency ablation procedure to take home and review. I also discuss medication management with patient today and prescribed a short course of Tramadol for her to take for moderate/severe pain. Patient is scheduled for right foot surgery  on 06/05/2022, would like to schedule injections after this procedure. I placed this request in referral. Patient encouraged to remain active as tolerated.  No red flag symptoms noted upon exam today.  2.   Chronic, worsening and severe right-sided thoracic back pain.  Patient continues to have severe pain despite good conservative therapy such as formal physical therapy and home exercise regimen. Patient's clinical presentation and exam are consistent with myofascial pain syndrome, I also feel her fibromyalgia is working to exacerbate her symptoms.  Her bilateral lower back pain seems to be more of an issue for her at this time.  We did discuss regrouping with formal physical therapy for manual treatments and possible dry needling.  I did encourage her to continue being active, can look into formal massage therapy.     Meds & Orders:  Meds ordered this encounter  Medications   traMADol (ULTRAM) 50 MG tablet    Sig: Take 1 tablet (50 mg total) by mouth every 8 (eight) hours as needed.    Dispense:  30 tablet    Refill:  0    Orders Placed This Encounter  Procedures   Ambulatory referral to Physical Medicine Rehab    Follow-up: Return for Bilateral L4-L5 and L5-S1 medial branch blocks.   Procedures: No procedures performed      Clinical History: EXAM: MRI LUMBAR SPINE WITHOUT CONTRAST   TECHNIQUE: Multiplanar, multisequence MR imaging of the lumbar spine was performed. No intravenous contrast was administered.   COMPARISON:  None.   FINDINGS: Segmentation:  Standard.   Alignment:  Physiologic.   Vertebrae:  No fracture, evidence of discitis, or bone lesion.   Conus medullaris and cauda equina: Conus extends to the T12 level. Conus and cauda equina appear normal.   Paraspinal and other soft tissues: Negative.   Disc levels:   T12-L1: No spinal canal or neural foraminal stenosis.   L1-2: Shallow disc bulge. No spinal canal or neural foraminal stenosis.   L2-3: Shallow disc bulge and mild facet degenerative changes. No spinal canal neural foraminal stenosis.   L3-4: Shallow disc bulge and mild facet degenerative changes. No significant spinal canal neural foraminal  stenosis.   L4-5: Shallow disc bulge, advanced facet degenerative changes ligamentum flavum redundancy without significant spinal canal or neural foraminal stenosis.   L5-S1: Advanced hypertrophic facet degenerative changes, more pronounced on the right side without significant spinal canal or neural foraminal stenosis.   IMPRESSION: 1. Advanced hypertrophic facet degenerative changes at the L4-5 and L5-S1 levels, which may be a source of low back pain. 2. Mild multilevel degenerative disc disease without significant spinal canal or neural foraminal stenosis at any level.     Electronically Signed   By: Pedro Earls M.D.   On: 06/14/2020 17:10   She reports that she has never smoked. She has never used smokeless tobacco. No results for input(s): "HGBA1C", "LABURIC" in the last 8760 hours.  Objective:  VS:  HT:    WT:   BMI:     BP:137/81  HR:83bpm  TEMP: ( )  RESP:95 % Physical Exam Vitals and nursing note reviewed.  HENT:     Head: Normocephalic and atraumatic.     Right Ear: External ear normal.     Left Ear: External ear normal.     Nose: Nose normal.     Mouth/Throat:     Mouth: Mucous membranes are moist.  Eyes:     Extraocular Movements: Extraocular movements intact.  Cardiovascular:  Rate and Rhythm: Normal rate.     Pulses: Normal pulses.  Pulmonary:     Effort: Pulmonary effort is normal.  Abdominal:     General: Abdomen is flat. There is no distension.  Musculoskeletal:        General: Tenderness present.     Cervical back: Normal range of motion.     Comments: Pt is slow to rise from seated position to standing. Concordant low back pain with facet loading, lumbar spine extension and rotation. Strong distal strength without clonus, no pain upon palpation of greater trochanters. Sensation intact bilaterally. Tenderness noted to right thoracic paraspinal region upon palpation. Walks independently, gait steady.   Skin:    General: Skin  is warm and dry.     Capillary Refill: Capillary refill takes less than 2 seconds.  Neurological:     General: No focal deficit present.     Mental Status: She is alert and oriented to person, place, and time.  Psychiatric:        Mood and Affect: Mood normal.        Behavior: Behavior normal.     Ortho Exam  Imaging: No results found.  Past Medical/Family/Surgical/Social History: Medications & Allergies reviewed per EMR, new medications updated. Patient Active Problem List   Diagnosis Date Noted   Status post total left knee replacement 07/15/2021   Neoplasm of uncertain behavior of skin 05/25/2021   Generalized osteoarthritis of multiple sites 05/25/2021   Fibromyalgia syndrome 05/25/2021   Restless legs 03/16/2021   Hyperlipidemia 03/16/2021   Anxiety 03/16/2021   Status post right knee replacement 12/24/2020   Unilateral primary osteoarthritis, left knee 11/11/2020   Unilateral primary osteoarthritis, right knee 11/11/2020   Complete tear of right rotator cuff 08/12/2020   Hypertensive disorder 05/05/2019   Past Medical History:  Diagnosis Date   Allergy    Anxiety    Arthritis    Depression    Fibromyalgia    GERD (gastroesophageal reflux disease)    Hyperlipidemia    Hypertension    Hypothyroidism    Melanoma ([place])    face   Thyroid disease    Family History  Problem Relation Age of Onset   Dementia Mother    Heart Problems Mother    Stroke Mother    Hypertension Mother    Hypertension Father    Colon polyps Father    Diabetes Father    Hypertension Sister    Diabetes Sister        borderline   Fibromyalgia Sister    Hypertension Sister    Colon polyps Sister    Diabetes Sister    Fibromyalgia Sister    Heart Problems Sister    Arthritis Sister    Hypertension Sister    Heart Problems Sister    Diabetes Paternal Aunt    Healthy Daughter    Colon cancer Neg Hx    Esophageal cancer Neg Hx    Rectal cancer Neg Hx    Stomach cancer Neg Hx     Past Surgical History:  Procedure Laterality Date   ABDOMINAL HYSTERECTOMY  1985   CHOLECYSTECTOMY     COLONOSCOPY     SHOULDER ARTHROSCOPY WITH ROTATOR CUFF REPAIR AND SUBACROMIAL DECOMPRESSION Right 08/12/2020   Procedure: RIGHT SHOULDER ARTHROSCOPY WITH ROTATOR CUFF REPAIR AND SUBACROMIAL DECOMPRESSION;  Surgeon: Mcarthur Rossetti, MD;  Location: [facility];  Service: Orthopedics;  Laterality: Right;   TOTAL KNEE ARTHROPLASTY Right 12/24/2020   Procedure: RIGHT TOTAL KNEE  ARTHROPLASTY;  Surgeon: Mcarthur Rossetti, MD;  Location: [facility];  Service: Orthopedics;  Laterality: Right;   TOTAL KNEE ARTHROPLASTY Left 07/15/2021   Procedure: LEFT TOTAL KNEE ARTHROPLASTY;  Surgeon: Mcarthur Rossetti, MD;  Location: [facility];  Service: Orthopedics;  Laterality: Left;   Social History   Occupational History   Not on file  Tobacco Use   Smoking status: Never   Smokeless tobacco: Never  Vaping Use   Vaping Use: Never used  Substance and Sexual Activity   Alcohol use: Not Currently   Drug use: Never   Sexual activity: Not Currently

## 2022-05-15 ENCOUNTER — Telehealth: Payer: Self-pay | Admitting: Podiatry

## 2022-05-15 NOTE — Telephone Encounter (Signed)
DOS: 06/05/2022  [place] Medicaid [employer]  [place] Procedure Including Bunionectomy Rt (96295) Aiken Osteotomy Rt (28413) Hammertoe Repair 2nd & 3rd Rt (24401) Capsulotomy MPJ Release Joint 2-3 Rt (02725)  Prior authorization is NOT required for CPT code 28270 (2 units) per [employer] and [employer] websites but IS required for CPT codes 36644, 03474, and 25956 (2 units).  Authorization #: [id number] Authorization Valid: 06/05/2022 - 08/03/2022  Confirmation #: [id number]

## 2022-05-16 ENCOUNTER — Telehealth: Payer: Self-pay | Admitting: Physical Medicine and Rehabilitation

## 2022-05-17 ENCOUNTER — Other Ambulatory Visit: Payer: Self-pay | Admitting: Physical Medicine and Rehabilitation

## 2022-05-17 NOTE — Telephone Encounter (Signed)
Called pt back to advise to f/u with pharmacy, Rx was sent in by [facility]

## 2022-05-31 ENCOUNTER — Encounter (HOSPITAL_BASED_OUTPATIENT_CLINIC_OR_DEPARTMENT_OTHER): Payer: Self-pay | Admitting: Podiatry

## 2022-05-31 ENCOUNTER — Ambulatory Visit: Payer: Self-pay

## 2022-05-31 ENCOUNTER — Ambulatory Visit: Payer: Medicaid Other | Admitting: Physical Medicine and Rehabilitation

## 2022-05-31 VITALS — BP 123/67 | HR 73

## 2022-05-31 DIAGNOSIS — M47816 Spondylosis without myelopathy or radiculopathy, lumbar region: Secondary | ICD-10-CM

## 2022-05-31 MED ORDER — BUPIVACAINE HCL 0.5 % IJ SOLN
3.0000 mL | Freq: Once | INTRAMUSCULAR | Status: AC
  Administered 2022-05-31: 3 mL

## 2022-05-31 NOTE — H&P (View-Only) (Signed)
Additional orders needed, IB message sent via Epic to Dr. Posey Pronto

## 2022-05-31 NOTE — Progress Notes (Signed)
Additional orders needed, IB message sent via Epic to Dr. Posey Pronto

## 2022-05-31 NOTE — Progress Notes (Signed)
Numeric Pain Rating Scale and Functional Assessment Average Pain 3   In the last MONTH (on 0-10 scale) has pain interfered with the following?  1. General activity like being  able to carry out your everyday physical activities such as walking, climbing stairs, carrying groceries, or moving a chair?  Rating( varies )   +Driver, -BT, -Dye Allergies.  Lower back pain across the back, no radiation. Standing too long, bending makes pain worse

## 2022-05-31 NOTE — Patient Instructions (Signed)

## 2022-05-31 NOTE — Progress Notes (Signed)
Spoke w/ via phone for pre-op interview--- patient Lab needs dos---- Istat (EKG NOT needed, EKG in Epic 10/17/2021)               Lab results------ COVID test -----patient states asymptomatic no test needed Arrive at ------- 0730 on 06/05/2022 NPO after MN NO Solid Food.  Clear liquids from MN until--- 0630 on 06/05/2022 Med rec completed Medications to take morning of surgery ----- synthroid, bentyl Diabetic medication ----- n/a Patient instructed no nail polish to be worn day of surgery Patient instructed to bring photo id and insurance card day of surgery Patient aware to have Driver (ride ) / caregiver    for 24 hours after surgery - Emberlee Sortino [phone] Patient Special Instructions ----- none Pre-Op special Istructions ----- none Patient verbalized understanding of instructions that were given at this phone interview. Patient denies shortness of breath, chest pain, fever, cough at this phone interview.  OV with Dr. Terri Skains of [facility] 11/25/2021 for f/u of precordial CP- echo in 5/23 showed preserved LVEF with normal diastolic function and no significant valvular heart disease, and exercise treadmill stress test  10/2021 was reported to be intermediate risk but stress ECG negative for ischemia. EF 60-65%, coronary calcium score = 0, recommended f/u in 3 years. EKG in Epic 10/17/21 NSR. Pt reports no chest pain or SOB.   Lyndel Pleasure, RN

## 2022-06-05 ENCOUNTER — Ambulatory Visit (HOSPITAL_BASED_OUTPATIENT_CLINIC_OR_DEPARTMENT_OTHER): Payer: Medicaid Other | Admitting: Anesthesiology

## 2022-06-05 ENCOUNTER — Encounter: Payer: Self-pay | Admitting: Podiatry

## 2022-06-05 ENCOUNTER — Ambulatory Visit (HOSPITAL_COMMUNITY): Payer: Medicaid Other

## 2022-06-05 ENCOUNTER — Other Ambulatory Visit: Payer: Self-pay

## 2022-06-05 ENCOUNTER — Ambulatory Visit (HOSPITAL_BASED_OUTPATIENT_CLINIC_OR_DEPARTMENT_OTHER)
Admission: RE | Admit: 2022-06-05 | Discharge: 2022-06-05 | Disposition: A | Discharge status: Home / Self Care | Payer: Medicaid Other | Attending: Podiatry | Admitting: Podiatry

## 2022-06-05 ENCOUNTER — Encounter (HOSPITAL_BASED_OUTPATIENT_CLINIC_OR_DEPARTMENT_OTHER)
Admission: RE | Disposition: A | Discharge status: Home / Self Care | Payer: Self-pay | Source: Home / Self Care | Attending: Podiatry

## 2022-06-05 ENCOUNTER — Ambulatory Visit (HOSPITAL_BASED_OUTPATIENT_CLINIC_OR_DEPARTMENT_OTHER): Payer: Medicaid Other

## 2022-06-05 ENCOUNTER — Other Ambulatory Visit: Payer: Self-pay | Admitting: Podiatry

## 2022-06-05 ENCOUNTER — Encounter (HOSPITAL_BASED_OUTPATIENT_CLINIC_OR_DEPARTMENT_OTHER): Payer: Self-pay | Admitting: Podiatry

## 2022-06-05 DIAGNOSIS — M1990 Unspecified osteoarthritis, unspecified site: Secondary | ICD-10-CM | POA: Diagnosis not present

## 2022-06-05 DIAGNOSIS — E039 Hypothyroidism, unspecified: Secondary | ICD-10-CM

## 2022-06-05 DIAGNOSIS — M2041 Other hammer toe(s) (acquired), right foot: Secondary | ICD-10-CM | POA: Insufficient documentation

## 2022-06-05 DIAGNOSIS — M21611 Bunion of right foot: Secondary | ICD-10-CM | POA: Diagnosis not present

## 2022-06-05 DIAGNOSIS — I10 Essential (primary) hypertension: Secondary | ICD-10-CM | POA: Diagnosis not present

## 2022-06-05 DIAGNOSIS — K219 Gastro-esophageal reflux disease without esophagitis: Secondary | ICD-10-CM | POA: Insufficient documentation

## 2022-06-05 DIAGNOSIS — M797 Fibromyalgia: Secondary | ICD-10-CM | POA: Diagnosis not present

## 2022-06-05 DIAGNOSIS — M7989 Other specified soft tissue disorders: Secondary | ICD-10-CM | POA: Diagnosis not present

## 2022-06-05 DIAGNOSIS — Z9889 Other specified postprocedural states: Secondary | ICD-10-CM | POA: Diagnosis not present

## 2022-06-05 HISTORY — PX: AIKEN OSTEOTOMY: SHX6331

## 2022-06-05 HISTORY — PX: CAPSULOTOMY METATARSOPHALANGEAL: SHX6614

## 2022-06-05 HISTORY — PX: HAMMER TOE SURGERY: SHX385

## 2022-06-05 HISTORY — PX: HALLUX VALGUS LAPIDUS: SHX6626

## 2022-06-05 LAB — POCT I-STAT, CHEM 8
BUN: 19 mg/dL (ref 8–23)
Calcium, Ion: 1.1 mmol/L — ABNORMAL LOW (ref 1.15–1.40)
Chloride: 104 mmol/L (ref 98–111)
Creatinine, Ser: 1.1 mg/dL — ABNORMAL HIGH (ref 0.44–1.00)
Glucose, Bld: 101 mg/dL — ABNORMAL HIGH (ref 70–99)
HCT: 32 % — ABNORMAL LOW (ref 36.0–46.0)
Hemoglobin: 10.9 g/dL — ABNORMAL LOW (ref 12.0–15.0)
Potassium: 3.2 mmol/L — ABNORMAL LOW (ref 3.5–5.1)
Sodium: 139 mmol/L (ref 135–145)
TCO2: 23 mmol/L (ref 22–32)

## 2022-06-05 SURGERY — BUNIONECTOMY, LAPIDUS
Anesthesia: General | Site: Toe | Laterality: Right

## 2022-06-05 MED ORDER — PROPOFOL 10 MG/ML IV BOLUS
INTRAVENOUS | Status: AC
  Filled 2022-06-05: qty 20

## 2022-06-05 MED ORDER — CEFAZOLIN SODIUM-DEXTROSE 2-4 GM/100ML-% IV SOLN
2.0000 g | Freq: Once | INTRAVENOUS | Status: AC
  Administered 2022-06-05: 2 g via INTRAVENOUS

## 2022-06-05 MED ORDER — LIDOCAINE HCL 2 % IJ SOLN
INTRAMUSCULAR | Status: DC | PRN
  Administered 2022-06-05: 5 mL

## 2022-06-05 MED ORDER — DEXAMETHASONE SODIUM PHOSPHATE 4 MG/ML IJ SOLN
INTRAMUSCULAR | Status: DC | PRN
  Administered 2022-06-05: 8 mg via INTRAVENOUS

## 2022-06-05 MED ORDER — FENTANYL CITRATE (PF) 100 MCG/2ML IJ SOLN
INTRAMUSCULAR | Status: AC
  Filled 2022-06-05: qty 2

## 2022-06-05 MED ORDER — OXYCODONE HCL 5 MG PO TABS
ORAL_TABLET | ORAL | Status: AC
  Filled 2022-06-05: qty 1

## 2022-06-05 MED ORDER — 0.9 % SODIUM CHLORIDE (POUR BTL) OPTIME
TOPICAL | Status: DC | PRN
  Administered 2022-06-05: 500 mL

## 2022-06-05 MED ORDER — LACTATED RINGERS IV SOLN: INTRAVENOUS | Status: DC

## 2022-06-05 MED ORDER — ACETAMINOPHEN 10 MG/ML IV SOLN
INTRAVENOUS | Status: DC | PRN
  Administered 2022-06-05: 1000 mg via INTRAVENOUS

## 2022-06-05 MED ORDER — FENTANYL CITRATE (PF) 100 MCG/2ML IJ SOLN
INTRAMUSCULAR | Status: DC | PRN
  Administered 2022-06-05 (×2): 25 ug via INTRAVENOUS
  Administered 2022-06-05: 50 ug via INTRAVENOUS

## 2022-06-05 MED ORDER — CEFAZOLIN SODIUM-DEXTROSE 2-4 GM/100ML-% IV SOLN: 2.0000 g | Freq: Once | INTRAVENOUS | Status: DC

## 2022-06-05 MED ORDER — ONDANSETRON HCL 4 MG/2ML IJ SOLN
INTRAMUSCULAR | Status: DC | PRN
  Administered 2022-06-05: 4 mg via INTRAVENOUS

## 2022-06-05 MED ORDER — MIDAZOLAM HCL 2 MG/2ML IJ SOLN
INTRAMUSCULAR | Status: DC | PRN
  Administered 2022-06-05: 2 mg via INTRAVENOUS

## 2022-06-05 MED ORDER — ONDANSETRON HCL 4 MG/2ML IJ SOLN
INTRAMUSCULAR | Status: AC
  Filled 2022-06-05: qty 2

## 2022-06-05 MED ORDER — CEFAZOLIN SODIUM-DEXTROSE 2-4 GM/100ML-% IV SOLN
INTRAVENOUS | Status: AC
  Filled 2022-06-05: qty 100

## 2022-06-05 MED ORDER — LIDOCAINE HCL (PF) 2 % IJ SOLN
INTRAMUSCULAR | Status: AC
  Filled 2022-06-05: qty 5

## 2022-06-05 MED ORDER — ACETAMINOPHEN 10 MG/ML IV SOLN
INTRAVENOUS | Status: AC
  Filled 2022-06-05: qty 100

## 2022-06-05 MED ORDER — PROPOFOL 10 MG/ML IV BOLUS
INTRAVENOUS | Status: DC | PRN
  Administered 2022-06-05: 30 mg via INTRAVENOUS
  Administered 2022-06-05: 170 mg via INTRAVENOUS

## 2022-06-05 MED ORDER — HYDROMORPHONE HCL 1 MG/ML IJ SOLN
INTRAMUSCULAR | Status: AC
  Filled 2022-06-05: qty 1

## 2022-06-05 MED ORDER — EPHEDRINE SULFATE (PRESSORS) 50 MG/ML IJ SOLN
INTRAMUSCULAR | Status: DC | PRN
  Administered 2022-06-05 (×3): 10 mg via INTRAVENOUS

## 2022-06-05 MED ORDER — PHENYLEPHRINE HCL (PRESSORS) 10 MG/ML IV SOLN
INTRAVENOUS | Status: DC | PRN
  Administered 2022-06-05 (×3): 80 ug via INTRAVENOUS

## 2022-06-05 MED ORDER — OXYCODONE-ACETAMINOPHEN 5-325 MG PO TABS: 1.0000 | ORAL_TABLET | ORAL | 0 refills | Status: DC | PRN

## 2022-06-05 MED ORDER — IBUPROFEN 800 MG PO TABS: 800.0000 mg | ORAL_TABLET | Freq: Four times a day (QID) | ORAL | 1 refills | Status: DC | PRN

## 2022-06-05 MED ORDER — MIDAZOLAM HCL 2 MG/2ML IJ SOLN
INTRAMUSCULAR | Status: AC
  Filled 2022-06-05: qty 2

## 2022-06-05 MED ORDER — HYDROMORPHONE HCL 1 MG/ML IJ SOLN
0.2500 mg | INTRAMUSCULAR | Status: DC | PRN
  Administered 2022-06-05: 0.5 mg via INTRAVENOUS

## 2022-06-05 MED ORDER — KETOROLAC TROMETHAMINE 30 MG/ML IJ SOLN
INTRAMUSCULAR | Status: DC | PRN
  Administered 2022-06-05: 30 mg via INTRAVENOUS

## 2022-06-05 MED ORDER — SUCCINYLCHOLINE CHLORIDE 200 MG/10ML IV SOSY
PREFILLED_SYRINGE | INTRAVENOUS | Status: AC
  Filled 2022-06-05: qty 10

## 2022-06-05 MED ORDER — OXYCODONE HCL 5 MG PO TABS
5.0000 mg | ORAL_TABLET | Freq: Once | ORAL | Status: AC
  Administered 2022-06-05: 5 mg via ORAL

## 2022-06-05 MED ORDER — LIDOCAINE HCL (CARDIAC) PF 100 MG/5ML IV SOSY
PREFILLED_SYRINGE | INTRAVENOUS | Status: DC | PRN
  Administered 2022-06-05: 80 mg via INTRAVENOUS

## 2022-06-05 MED ORDER — DEXAMETHASONE SODIUM PHOSPHATE 10 MG/ML IJ SOLN
INTRAMUSCULAR | Status: AC
  Filled 2022-06-05: qty 1

## 2022-06-05 MED ORDER — BUPIVACAINE HCL 0.5 % IJ SOLN
INTRAMUSCULAR | Status: DC | PRN
  Administered 2022-06-05: 5 mL

## 2022-06-05 SURGICAL SUPPLY — 56 items
BLADE AVERAGE 25X9 (BLADE) IMPLANT
BLADE OSC/SAG .038X5.5 CUT EDG (BLADE) ×3 IMPLANT
BLADE OSCILLATING/SAGITTAL (BLADE)
BLADE SAW LAPIPLASTY 40X11 (BLADE) IMPLANT
BLADE SURG 15 STRL LF DISP TIS (BLADE) ×9 IMPLANT
BLADE SURG 15 STRL SS (BLADE) ×8
BLADE SW THK.38XMED LNG THN (BLADE) IMPLANT
BNDG CMPR 9X4 STRL LF SNTH (GAUZE/BANDAGES/DRESSINGS) ×2
BNDG ELASTIC 4X5.8 VLCR STR LF (GAUZE/BANDAGES/DRESSINGS) ×3 IMPLANT
BNDG ESMARK 4X9 LF (GAUZE/BANDAGES/DRESSINGS) ×3 IMPLANT
BNDG GAUZE DERMACEA FLUFF 4 (GAUZE/BANDAGES/DRESSINGS) ×3 IMPLANT
BNDG GZE DERMACEA 4 6PLY (GAUZE/BANDAGES/DRESSINGS) ×2
COVER BACK TABLE 60X90IN (DRAPES) ×3 IMPLANT
CUFF TOURN SGL QUICK 18 (TOURNIQUET CUFF) IMPLANT
DRAPE EXTREMITY T 121X128X90 (DISPOSABLE) ×3 IMPLANT
DRAPE IMP U-DRAPE 54X76 (DRAPES) ×3 IMPLANT
DRAPE OEC MINIVIEW 54X84 (DRAPES) ×3 IMPLANT
DURAPREP 26ML APPLICATOR (WOUND CARE) ×3 IMPLANT
ELECT REM PT RETURN 9FT ADLT (ELECTROSURGICAL) ×2
ELECTRODE REM PT RTRN 9FT ADLT (ELECTROSURGICAL) ×3 IMPLANT
GAUZE SPONGE 4X4 12PLY STRL (GAUZE/BANDAGES/DRESSINGS) ×3 IMPLANT
GAUZE SPONGE 4X4 12PLY STRL LF (GAUZE/BANDAGES/DRESSINGS) IMPLANT
GAUZE XEROFORM 1X8 LF (GAUZE/BANDAGES/DRESSINGS) ×3 IMPLANT
GLOVE BIO SURGEON STRL SZ7 (GLOVE) ×3 IMPLANT
GLOVE BIOGEL PI IND STRL 6.5 (GLOVE) IMPLANT
GLOVE BIOGEL PI IND STRL 7.5 (GLOVE) ×3 IMPLANT
GLOVE SURG SS PI 7.5 STRL IVOR (GLOVE) IMPLANT
GOWN STRL REUS W/ TWL LRG LVL3 (GOWN DISPOSABLE) IMPLANT
GOWN STRL REUS W/TWL LRG LVL3 (GOWN DISPOSABLE) ×5 IMPLANT
HYDROGEN PEROXIDE 16OZ (MISCELLANEOUS) IMPLANT
INST GUIDED SPEEDRELEASE (INSTRUMENTS) ×2
INSTRUMENT GUIDED SPEEDRELEASE (INSTRUMENTS) IMPLANT
K-WIRE DBL END TROCAR 6X.062 (WIRE) ×4
KIT TURNOVER CYSTO (KITS) ×3 IMPLANT
KWIRE DBL END TROCAR 6X.062 (WIRE) IMPLANT
LAPIPLASTY SYS 4A (Orthopedic Implant) ×2 IMPLANT
NS IRRIG 500ML POUR BTL (IV SOLUTION) IMPLANT
PACK BASIN DAY SURGERY FS (CUSTOM PROCEDURE TRAY) ×3 IMPLANT
PENCIL SMOKE EVACUATOR (MISCELLANEOUS) ×3 IMPLANT
PIN CAPS ORTHO GREEN .062 (PIN) IMPLANT
SCREW 2.7 HIGH PITCH LOCKING (Screw) IMPLANT
STOCKINETTE 6  STRL (DRAPES) ×2
STOCKINETTE 6 STRL (DRAPES) ×3 IMPLANT
SUCTION FRAZIER HANDLE 10FR (MISCELLANEOUS) ×2
SUCTION TUBE FRAZIER 10FR DISP (MISCELLANEOUS) ×3 IMPLANT
SUT MNCRL AB 3-0 PS2 18 (SUTURE) ×3 IMPLANT
SUT MNCRL AB 4-0 PS2 18 (SUTURE) ×3 IMPLANT
SUT MON AB 5-0 PS2 18 (SUTURE) ×3 IMPLANT
SUT PROLENE 3 0 PS 2 (SUTURE) IMPLANT
SUT PROLENE 4 0 PS 2 18 (SUTURE) IMPLANT
SYR BULB EAR ULCER 3OZ GRN STR (SYRINGE) ×3 IMPLANT
SYR CONTROL 10ML LL (SYRINGE) IMPLANT
SYSTEM LAPIPLASTY 4A (Orthopedic Implant) IMPLANT
TOWEL OR 17X26 10 PK STRL BLUE (TOWEL DISPOSABLE) ×3 IMPLANT
TUBE CONNECTING 12X1/4 (SUCTIONS) ×3 IMPLANT
UNDERPAD 30X36 HEAVY ABSORB (UNDERPADS AND DIAPERS) ×3 IMPLANT

## 2022-06-05 NOTE — Anesthesia Postprocedure Evaluation (Signed)
Anesthesia Post Note  Patient: Jasiyah Poland  Procedure(s) Performed: HALLUX VALGUS LAPIDUS (Right: Foot) Barbie Banner OSTEOTOMY (Right: Toe) HAMMER TOE CORRECTION (Right: Toe) CAPSULOTOMY METATARSOPHALANGEAL (Right: Foot)     Patient location during evaluation: PACU Anesthesia Type: General Level of consciousness: awake Pain management: pain level controlled Vital Signs Assessment: post-procedure vital signs reviewed and stable Respiratory status: spontaneous breathing Cardiovascular status: stable Postop Assessment: no apparent nausea or vomiting Anesthetic complications: no   No notable events documented.  Last Vitals:  Vitals:   06/05/22 1316 06/05/22 1321  BP: 95/67 101/66  Pulse: 82 82  Resp: 16   Temp: 36.7 C   SpO2: 94%     Last Pain:  Vitals:   06/05/22 1316  TempSrc: Oral  PainSc: 0-No pain                 Britteney Ayotte

## 2022-06-05 NOTE — Anesthesia Preprocedure Evaluation (Signed)
Anesthesia Evaluation  Patient identified by MRN, date of birth, ID band Patient awake    Reviewed: Allergy & Precautions, NPO status   Airway Mallampati: II       Dental   Pulmonary neg pulmonary ROS   breath sounds clear to auscultation       Cardiovascular hypertension,  Rhythm:Regular Rate:Normal     Neuro/Psych  PSYCHIATRIC DISORDERS       Neuromuscular disease    GI/Hepatic Neg liver ROS,GERD  ,,  Endo/Other  Hypothyroidism    Renal/GU negative Renal ROS     Musculoskeletal  (+) Arthritis ,  Fibromyalgia -  Abdominal   Peds  Hematology   Anesthesia Other Findings   Reproductive/Obstetrics                             Anesthesia Physical Anesthesia Plan  ASA: 3  Anesthesia Plan: General   Post-op Pain Management: Tylenol PO (pre-op)*   Induction: Intravenous  PONV Risk Score and Plan: 3 and Treatment may vary due to age or medical condition, Ondansetron, Dexamethasone and Midazolam  Airway Management Planned: LMA  Additional Equipment:   Intra-op Plan:   Post-operative Plan: Extubation in OR  Informed Consent:      Dental advisory given  Plan Discussed with: CRNA and Anesthesiologist  Anesthesia Plan Comments:        Anesthesia Quick Evaluation

## 2022-06-05 NOTE — Interval H&P Note (Signed)
History and Physical Interval Note:  06/05/2022 7:25 AM  Denise Morales  has presented today for surgery, with the diagnosis of BUNION RIGHT  HAMMERTOE 2ND/3RD RIGHT.  The various methods of treatment have been discussed with the patient and family. After consideration of risks, benefits and other options for treatment, the patient has consented to  Procedure(s) with comments: HALLUX VALGUS LAPIDUS (Right) - POPLITEAL BLOCK AIKEN OSTEOTOMY (Right) HAMMER TOE CORRECTION (Right) CAPSULOTOMY METATARSOPHALANGEAL (Right) as a surgical intervention.  The patient's history has been reviewed, patient examined, no change in status, stable for surgery.  I have reviewed the patient's chart and labs.  Questions were answered to the patient's satisfaction.     Felipa Furnace

## 2022-06-05 NOTE — Discharge Instructions (Addendum)
After Surgery Instructions   1) If you are recuperating from surgery anywhere other than home, please be sure to leave [place] the number where you can be reached.  2) Go directly home and rest.  3) Keep the operated foot(feet) elevated six inches above the hip when sitting or lying down. This will help control swelling and pain.  4) Support the elevated foot and leg with pillows. DO NOT PLACE PILLOWS UNDER THE KNEE.  5) DO NOT REMOVE or get your bandages WET, unless you were given different instructions by your doctor to do so. This increases the risk of infection.  6) Wear your surgical shoe or surgical boot at all times when you are up on your feet. Do not put any weight on surgical foot, if you need to put weight on it put it on the heel.   7) A limited amount of pain and swelling may occur. The skin may take on a bruised appearance. DO NOT BE ALARMED, THIS IS NORMAL.  8) For slight pain and swelling, apply an ice pack directly over the bandages for 15 minutes only out of each hour of the day. Continue until seen in the office for your first post op visit. DON NOT     APPLY ANY FORM OF HEAT TO THE AREA.  9) Have prescriptions filled immediately and take as directed.  10) Drink lots of liquids, water and juice to stay hydrated.  11) CALL IMMEDIATELY IF:  *Bleeding continues until the following day of surgery  *Pain increases and/or does not respond to medication  *Bandages or cast appears to tight  *If your bandage gets wet  *Trip, fall or stump your surgical foot  *If your temperature goes above 101  *If you have ANY questions at all  [place]. ADHERING TO THESE INSTRUCTIONS WILL OFFER YOU THE MOST COMPLETE RESULTS      No acetaminophen/Tylenol until after 3:42 pm today if needed.   No ibuprofen, Advil, Aleve, Motrin, ketorolac, meloxicam, naproxen, or other NSAIDS until after 5:15 pm today if needed.      Post Anesthesia Home Care  Instructions  Activity: Get plenty of rest for the remainder of the day. A responsible individual must stay with you for 24 hours following the procedure.  For the next 24 hours, DO NOT: -Drive a car -Paediatric nurse -Drink alcoholic beverages -Take any medication unless instructed by your physician -Make any legal decisions or sign important papers.  Meals: Start with liquid foods such as gelatin or soup. Progress to regular foods as tolerated. Avoid greasy, spicy, heavy foods. If nausea and/or vomiting occur, drink only clear liquids until the nausea and/or vomiting subsides. Call your physician if vomiting continues.  Special Instructions/Symptoms: Your throat may feel dry or sore from the anesthesia or the breathing tube placed in your throat during surgery. If this causes discomfort, gargle with warm salt water. The discomfort should disappear within 24 hours.

## 2022-06-05 NOTE — Anesthesia Procedure Notes (Signed)
Procedure Name: LMA Insertion Date/Time: 06/05/2022 9:53 AM  Performed by: Georgeanne Nim, CRNAPre-anesthesia Checklist: Patient identified, Emergency Drugs available, Suction available, Patient being monitored and Timeout performed Patient Re-evaluated:Patient Re-evaluated prior to induction Oxygen Delivery Method: Circle system utilized Preoxygenation: Pre-oxygenation with 100% oxygen Induction Type: IV induction Ventilation: Mask ventilation without difficulty LMA: LMA inserted LMA Size: 4.0 Number of attempts: 1 Placement Confirmation: positive ETCO2 and breath sounds checked- equal and bilateral Tube secured with: Tape Dental Injury: Teeth and Oropharynx as per pre-operative assessment

## 2022-06-05 NOTE — Transfer of Care (Signed)
Immediate Anesthesia Transfer of Care Note  Patient: Denise Morales  Procedure(s) Performed: HALLUX VALGUS LAPIDUS (Right: Foot) Barbie Banner OSTEOTOMY (Right: Toe) HAMMER TOE CORRECTION (Right: Toe) CAPSULOTOMY METATARSOPHALANGEAL (Right: Foot)  Patient Location: PACU  Anesthesia Type:General  Level of Consciousness: awake, alert , and patient cooperative  Airway & Oxygen Therapy: Patient Spontanous Breathing and Patient connected to nasal cannula oxygen  Post-op Assessment: Report given to RN and Post -op Vital signs reviewed and stable  Post vital signs: Reviewed and stable  Last Vitals:  Vitals Value Taken Time  BP 121/58 06/05/22 1135  Temp    Pulse 79 06/05/22 1139  Resp 37 06/05/22 1139  SpO2 97 % 06/05/22 1139  Vitals shown include unvalidated device data.  Last Pain:  Vitals:   06/05/22 0753  TempSrc: Oral         Complications: No notable events documented.

## 2022-06-05 NOTE — Progress Notes (Signed)
Orthopedic Tech Progress Note Patient Details:  Denise Morales 01-24-1961 [id number]  Ortho Devices Type of Ortho Device: CAM walker Ortho Device/Splint Location: Right foot      Denise Morales Denise Morales 06/05/2022, 11:45 AM

## 2022-06-06 ENCOUNTER — Other Ambulatory Visit: Payer: Self-pay | Admitting: Physical Medicine and Rehabilitation

## 2022-06-06 DIAGNOSIS — M47816 Spondylosis without myelopathy or radiculopathy, lumbar region: Secondary | ICD-10-CM

## 2022-06-06 DIAGNOSIS — M545 Low back pain, unspecified: Secondary | ICD-10-CM

## 2022-06-06 NOTE — Progress Notes (Signed)
Per pain diary, greater than 80% relief of pain with recent diagnostic lumbar medial branch blocks. We will proceed with second set.

## 2022-06-08 ENCOUNTER — Encounter (HOSPITAL_BASED_OUTPATIENT_CLINIC_OR_DEPARTMENT_OTHER): Payer: Self-pay | Admitting: Podiatry

## 2022-06-08 NOTE — Procedures (Signed)
Lumbar Diagnostic Facet Joint Nerve Block with Fluoroscopic Guidance   Patient: Denise Morales      Date of Birth: 1961/02/11 MRN: [id number] PCP: Everardo Beals, NP      Visit Date: 05/31/2022   Universal Protocol:    Date/Time: 12/14/238:17 AM  Consent Given By: the patient  Position: PRONE  Additional Comments: Vital signs were monitored before and after the procedure. Patient was prepped and draped in the usual sterile fashion. The correct patient, procedure, and site was verified.   Injection Procedure Details:   Procedure diagnoses:  1. Spondylosis without myelopathy or radiculopathy, lumbar region      Meds Administered:  Meds ordered this encounter  Medications   bupivacaine (MARCAINE) 0.5 % (with pres) injection 3 mL     Laterality: Bilateral  Location/Site: L4-L5, L3 and L4 medial branches and L5-S1, L4 medial branch and L5 dorsal ramus  Needle: 5.0 in., 25 ga.  Short bevel or Quincke spinal needle  Needle Placement: Oblique pedical  Findings:   -Comments: There was excellent flow of contrast along the articular pillars without intravascular flow.  Procedure Details: The fluoroscope beam is vertically oriented in AP and then obliqued 15 to 20 degrees to the ipsilateral side of the desired nerve to achieve the "Scotty dog" appearance.  The skin over the target area of the junction of the superior articulating process and the transverse process (sacral ala if blocking the L5 dorsal rami) was locally anesthetized with a 1 ml volume of 1% Lidocaine without Epinephrine.  The spinal needle was inserted and advanced in a trajectory view down to the target.   After contact with periosteum and negative aspirate for blood and CSF, correct placement without intravascular or epidural spread was confirmed by injecting 0.5 ml. of Isovue-250.  A spot radiograph was obtained of this image.    Next, a 0.5 ml. volume of the injectate described above was injected. The  needle was then redirected to the other facet joint nerves mentioned above if needed.  Prior to the procedure, the patient was given a Pain Diary which was completed for baseline measurements.  After the procedure, the patient rated their pain every 30 minutes and will continue rating at this frequency for a total of 5 hours.  The patient has been asked to complete the Diary and return to [place] by mail, fax or hand delivered as soon as possible.   Additional Comments:  The patient tolerated the procedure well Dressing: 2 x 2 sterile gauze and Band-Aid    Post-procedure details: Patient was observed during the procedure. Post-procedure instructions were reviewed.  Patient left the clinic in stable condition.

## 2022-06-08 NOTE — Progress Notes (Signed)
Denise Morales - 61 y.o. female MRN [id number]  Date of birth: Oct 04, 1960  Office Visit Note: Visit Date: 05/31/2022 PCP: Everardo Beals, NP Referred by: Everardo Beals, NP  Subjective: Chief Complaint  Patient presents with   Lower Back - Pain   HPI:  Denise Morales is a 61 y.o. female who comes in today at the request of Barnet Pall, FNP for planned Bilateral  L4-5 and L5-S1 Lumbar facet/medial branch block with fluoroscopic guidance.  The patient has failed conservative care including home exercise, medications, time and activity modification.  This injection will be diagnostic and hopefully therapeutic.  Please see requesting physician notes for further details and justification.  Exam has shown concordant pain with facet joint loading.   ROS Otherwise per HPI.  Assessment & Plan: Visit Diagnoses:    ICD-10-CM   1. Spondylosis without myelopathy or radiculopathy, lumbar region  M47.816 XR C-ARM NO REPORT    Facet Injection    bupivacaine (MARCAINE) 0.5 % (with pres) injection 3 mL      Plan: No additional findings.   Meds & Orders:  Meds ordered this encounter  Medications   bupivacaine (MARCAINE) 0.5 % (with pres) injection 3 mL    Orders Placed This Encounter  Procedures   Facet Injection   XR C-ARM NO REPORT    Follow-up: Return for Review Pain Diary.   Procedures: No procedures performed  Lumbar Diagnostic Facet Joint Nerve Block with Fluoroscopic Guidance   Patient: Denise Morales      Date of Birth: [birth year] MRN: [id number] PCP: Everardo Beals, NP      Visit Date: 05/31/2022   Universal Protocol:    Date/Time: 12/14/238:17 AM  Consent Given By: the patient  Position: PRONE  Additional Comments: Vital signs were monitored before and after the procedure. Patient was prepped and draped in the usual sterile fashion. The correct patient, procedure, and site was verified.   Injection Procedure Details:   Procedure diagnoses:  1.  Spondylosis without myelopathy or radiculopathy, lumbar region      Meds Administered:  Meds ordered this encounter  Medications   bupivacaine (MARCAINE) 0.5 % (with pres) injection 3 mL     Laterality: Bilateral  Location/Site: L4-L5, L3 and L4 medial branches and L5-S1, L4 medial branch and L5 dorsal ramus  Needle: 5.0 in., 25 ga.  Short bevel or Quincke spinal needle  Needle Placement: Oblique pedical  Findings:   -Comments: There was excellent flow of contrast along the articular pillars without intravascular flow.  Procedure Details: The fluoroscope beam is vertically oriented in AP and then obliqued 15 to 20 degrees to the ipsilateral side of the desired nerve to achieve the "Scotty dog" appearance.  The skin over the target area of the junction of the superior articulating process and the transverse process (sacral ala if blocking the L5 dorsal rami) was locally anesthetized with a 1 ml volume of 1% Lidocaine without Epinephrine.  The spinal needle was inserted and advanced in a trajectory view down to the target.   After contact with periosteum and negative aspirate for blood and CSF, correct placement without intravascular or epidural spread was confirmed by injecting 0.5 ml. of Isovue-250.  A spot radiograph was obtained of this image.    Next, a 0.5 ml. volume of the injectate described above was injected. The needle was then redirected to the other facet joint nerves mentioned above if needed.  Prior to the procedure, the patient was given a Pain Diary which was completed  for baseline measurements.  After the procedure, the patient rated their pain every 30 minutes and will continue rating at this frequency for a total of 5 hours.  The patient has been asked to complete the Diary and return to [place] by mail, fax or hand delivered as soon as possible.   Additional Comments:  The patient tolerated the procedure well Dressing: 2 x 2 sterile gauze and Band-Aid     Post-procedure details: Patient was observed during the procedure. Post-procedure instructions were reviewed.  Patient left the clinic in stable condition.   Clinical History: EXAM: MRI LUMBAR SPINE WITHOUT CONTRAST   TECHNIQUE: Multiplanar, multisequence MR imaging of the lumbar spine was performed. No intravenous contrast was administered.   COMPARISON:  None.   FINDINGS: Segmentation:  Standard.   Alignment:  Physiologic.   Vertebrae:  No fracture, evidence of discitis, or bone lesion.   Conus medullaris and cauda equina: Conus extends to the T12 level. Conus and cauda equina appear normal.   Paraspinal and other soft tissues: Negative.   Disc levels:   T12-L1: No spinal canal or neural foraminal stenosis.   L1-2: Shallow disc bulge. No spinal canal or neural foraminal stenosis.   L2-3: Shallow disc bulge and mild facet degenerative changes. No spinal canal neural foraminal stenosis.   L3-4: Shallow disc bulge and mild facet degenerative changes. No significant spinal canal neural foraminal stenosis.   L4-5: Shallow disc bulge, advanced facet degenerative changes ligamentum flavum redundancy without significant spinal canal or neural foraminal stenosis.   L5-S1: Advanced hypertrophic facet degenerative changes, more pronounced on the right side without significant spinal canal or neural foraminal stenosis.   IMPRESSION: 1. Advanced hypertrophic facet degenerative changes at the L4-5 and L5-S1 levels, which may be a source of low back pain. 2. Mild multilevel degenerative disc disease without significant spinal canal or neural foraminal stenosis at any level.     Electronically Signed   By: Pedro Earls M.D.   On: 06/14/2020 17:10     Objective:  VS:  HT:    WT:   BMI:     BP:123/67  HR:73bpm  TEMP: ( )  RESP:  Physical Exam Vitals and nursing note reviewed.  Constitutional:      General: She is not in acute distress.     Appearance: Normal appearance. She is not ill-appearing.  HENT:     Head: Normocephalic and atraumatic.     Right Ear: External ear normal.     Left Ear: External ear normal.  Eyes:     Extraocular Movements: Extraocular movements intact.  Cardiovascular:     Rate and Rhythm: Normal rate.     Pulses: Normal pulses.  Pulmonary:     Effort: Pulmonary effort is normal. No respiratory distress.  Abdominal:     General: There is no distension.     Palpations: Abdomen is soft.  Musculoskeletal:        General: Tenderness present.     Cervical back: Neck supple.     Right lower leg: No edema.     Left lower leg: No edema.     Comments: Patient has good distal strength with no pain over the greater trochanters.  No clonus or focal weakness.  Skin:    Findings: No erythema, lesion or rash.  Neurological:     General: No focal deficit present.     Mental Status: She is alert and oriented to person, place, and time.     Sensory:  No sensory deficit.     Motor: No weakness or abnormal muscle tone.     Coordination: Coordination normal.  Psychiatric:        Mood and Affect: Mood normal.        Behavior: Behavior normal.      Imaging: No results found.

## 2022-06-14 ENCOUNTER — Ambulatory Visit (INDEPENDENT_AMBULATORY_CARE_PROVIDER_SITE_OTHER): Payer: Medicaid Other | Admitting: Podiatry

## 2022-06-14 ENCOUNTER — Ambulatory Visit (INDEPENDENT_AMBULATORY_CARE_PROVIDER_SITE_OTHER): Payer: Medicaid Other

## 2022-06-14 DIAGNOSIS — M21611 Bunion of right foot: Secondary | ICD-10-CM

## 2022-06-14 DIAGNOSIS — G4700 Insomnia, unspecified: Secondary | ICD-10-CM | POA: Insufficient documentation

## 2022-06-14 DIAGNOSIS — M24574 Contracture, right foot: Secondary | ICD-10-CM

## 2022-06-14 DIAGNOSIS — M2041 Other hammer toe(s) (acquired), right foot: Secondary | ICD-10-CM

## 2022-06-14 DIAGNOSIS — Z9889 Other specified postprocedural states: Secondary | ICD-10-CM

## 2022-06-14 NOTE — Progress Notes (Signed)
Subjective:  Patient ID: Denise Morales, female    DOB: 06/03/1961,  MRN: [id number]  Chief Complaint  Patient presents with   Routine Post Op    POV #1 DOS 06/05/2022 RT LAPIDUS BUNIONECTOMY W.POSS AIKEN OSTEOTOMY W/HAMMERTOE ARTHROPLASTY OF 2,3 W/CAPSULOTOMY OF 2,3    DOS: Right Lapidus bunionectomy Procedure: Hammertoe arthroplasty of 2 and 3 with capsulotomy of 2 and 3  61 y.o. female returns for post-op check.  Patient states she is doing well.  Overall very happy with the surgery.  Reduction of bunion deformity noted bandages clean dry and intact noted.  Review of Systems: Negative except as noted in the HPI. Denies N/V/F/Ch.  Past Medical History:  Diagnosis Date   Allergy    Anxiety    Arthritis    Depression    Fibromyalgia    GERD (gastroesophageal reflux disease)    Hyperlipidemia    Hypertension    Hypothyroidism    Melanoma ([place])    face   Thyroid disease     Current Outpatient Medications:    acetaminophen (TYLENOL) 500 MG tablet, Take 500 mg by mouth every 6 (six) hours as needed for mild pain., Disp: , Rfl:    allopurinol (ZYLOPRIM) 100 MG tablet, Take 100 mg by mouth daily., Disp: , Rfl:    busPIRone (BUSPAR) 10 MG tablet, Take 10 mg by mouth 3 (three) times daily., Disp: , Rfl:    celecoxib (CELEBREX) 200 MG capsule, Take 1 capsule (200 mg total) by mouth 2 (two) times daily between meals as needed., Disp: 60 capsule, Rfl: 3   cyclobenzaprine (FLEXERIL) 5 MG tablet, Take 1 tablet by mouth 3 (three) times daily as needed., Disp: , Rfl:    dicyclomine (BENTYL) 10 MG capsule, Take 10 mg by mouth every 6 (six) hours as needed for spasms., Disp: , Rfl:    diphenhydrAMINE (BENADRYL) 25 mg capsule, Take 25 mg by mouth daily., Disp: , Rfl:    escitalopram (LEXAPRO) 20 MG tablet, Take 1 tablet (20 mg total) by mouth daily., Disp: 90 tablet, Rfl: 1   ibuprofen (ADVIL) 800 MG tablet, Take 1 tablet (800 mg total) by mouth every 6 (six) hours as needed., Disp: 60  tablet, Rfl: 1   Krill Oil 500 MG CAPS, Take 1 capsule by mouth in the morning., Disp: , Rfl:    levothyroxine (SYNTHROID) 50 MCG tablet, Take 50 mcg by mouth daily., Disp: , Rfl:    lisinopril-hydrochlorothiazide (ZESTORETIC) 10-12.5 MG tablet, Take 1 tablet by mouth daily., Disp: 90 tablet, Rfl: 1   naproxen (EC NAPROSYN) 500 MG EC tablet, Take 500 mg by mouth 2 (two) times daily with a meal., Disp: , Rfl:    oxyCODONE-acetaminophen (PERCOCET) 5-325 MG tablet, Take 1 tablet by mouth every 4 (four) hours as needed for severe pain., Disp: 30 tablet, Rfl: 0   pantoprazole (PROTONIX) 40 MG tablet, TAKE 1 TABLET BY MOUTH TWICE DAILY BEFORE A MEAL, Disp: 60 tablet, Rfl: 2   pravastatin (PRAVACHOL) 40 MG tablet, Take 1 tablet (40 mg total) by mouth daily., Disp: 90 tablet, Rfl: 1   sucralfate (CARAFATE) 1 g tablet, TAKE 1 TABLET BY MOUTH THREE TIMES DAILY AS NEEDED FOR HEART BURN., Disp: 90 tablet, Rfl: 0   traMADol (ULTRAM) 50 MG tablet, Take 1 tablet (50 mg total) by mouth every 8 (eight) hours as needed., Disp: 30 tablet, Rfl: 0   Turmeric 500 MG CAPS, Take 500 mg by mouth daily., Disp: , Rfl:    Vitamin D-Vitamin  K (VITAMIN K2-VITAMIN D3 PO), Take 1 tablet by mouth daily., Disp: , Rfl:    zolpidem (AMBIEN) 5 MG tablet, Take 5 mg by mouth at bedtime as needed., Disp: , Rfl:   Social History   Tobacco Use  Smoking Status Never  Smokeless Tobacco Never    Allergies  Allergen Reactions   Hydroxyzine     Other reaction(s): Hallucinations   Objective:  There were no vitals filed for this visit. There is no height or weight on file to calculate BMI. Constitutional Well developed. Well nourished.  Vascular Foot warm and well perfused. Capillary refill normal to all digits.   Neurologic Normal speech. Oriented to person, place, and time. Epicritic sensation to light touch grossly present bilaterally.  Dermatologic Skin healing well without signs of infection. Skin edges well coapted without  signs of infection.  Orthopedic: Tenderness to palpation noted about the surgical site.   Radiographs: 3 views of skeletally mature the right foot: Hardware is intact.  Reduction of bunion deformity noted sesamoid position has reduced. Assessment:   1. Bunion of right foot   2. Hammertoe of right foot   3. Joint contracture of foot, right   4. Status post foot surgery    Plan:  Patient was evaluated and treated and all questions answered.  S/p foot surgery right -Progressing as expected post-operatively. -XR: See above -WB Status: Nonweightbearing in right lower extremity with walker -Sutures: Intact.  No clinical signs of dehiscence noted no complication noted. -Medications: None -Foot redressed.  No follow-ups on file.

## 2022-06-15 NOTE — Op Note (Signed)
Surgeon: Surgeon(s): Felipa Furnace, DPM  Assistants: None Pre-operative diagnosis: BUNION RIGHT  HAMMERTOE 2ND/3RD RIGHT  Post-operative diagnosis: same Procedure: Procedure(s) (LRB): HALLUX VALGUS LAPIDUS (Right) AIKEN OSTEOTOMY (Right) HAMMER TOE CORRECTION (Right) CAPSULOTOMY METATARSOPHALANGEAL (Right)  Pathology: * No specimens in log *  Pertinent Intra-op findings: Severe bunion deformity noted.  Hammertoe contracture of right second and third noted with joint contracture Anesthesia: Choice  Hemostasis:  Total Tourniquet Time Documented: Calf (Right) - 78 minutes Total: Calf (Right) - 78 minutes  EBL: 20 mL  Materials: Treace medical Lapiplasty and 0.045 K wires x 2.  3-0 Monocryl 4-0 Monocryl 5-0 Monocryl, 3-0 Prolene Injectables: None Complications: None  Indications for surgery: A 61 y.o. female presents with right severe bunion deformity with underlying hammertoe contracture of second and third digit with metatarsophalangeal joint contracture of second and third. Patient has failed all conservative therapy including but not limited to shoe gear modification padding protecting taping. She wishes to have surgical correction of the foot/deformity. It was determined that patient would benefit from right Lapiplasty with Lapidus fusion and hammertoe arthroplasty of second and third with capsulotomy of second and third metatarsal phalangeal joint. Informed surgical risk consent was reviewed and read aloud to the patient.  I reviewed the films.  I have discussed my findings with the patient in great detail.  I have discussed all risks including but not limited to infection, stiffness, scarring, limp, disability, deformity, damage to blood vessels and nerves, numbness, poor healing, need for braces, arthritis, chronic pain, amputation, death.  All benefits and realistic expectations discussed in great detail.  I have made no promises as to the outcome.  I have provided realistic  expectations.  I have offered the patient a 2nd opinion, which they have declined and assured me they preferred to proceed despite the risks   Procedure in detail: The patient was both verbally and visually identified by myself, the nursing staff, and anesthesia staff in the preoperative holding area. They were then transferred to the operating room and placed on the operative table in supine position.  Patient is brought to the operating room and placed right on table in supine position well-padded ankle  tourniquet is applied to the right calf. Anesthesia is achievedpopliteal and saphenous nerve block with MAC. The right extremity was then scrubbed prepped and draped in normal sterile customary fashion. Upon  exsanguination of the foot the calf tourniquet was inflated to 250 mm of mercury. Attention is then  directed at the 1st metatarsocuneiform joint where an incision is initiated at the proximal aspect of the  medial cuneiform down to the level of the mid shaft of the 1st metatarsal. A #15 Blade was then used  to deepen the incision through the subcutaneous tissues down the level of the periosteum. The  periosteum is then reflected at the 1st metatarsal and metatarsocuneiform joint and medial cuneiform.  All superficial bleeders were identified and bovied care was taken to retract the neurovascular  structures medial. At this point the 40 mm saw blade is used to reciprocally plane the  metatarsocuneiform joint a osteotome was then used to free the plantar ligaments, this is to allow  mobilization of the 1st metatarsal cuneiform joint in order to free and correct the frontal plane motion  with this bunion deformity. At this point a Valora Corporal is used to create a pocket at the base laterally of the  1st metatarsal. At this point a short K-wire is then placed at the base of the  1st metatarsal positioned  towards the 5th metatarsal head to the lateral cortex of the 1st metatarsal this joy stick is  then used to  correct the frontal plane motion. At this point inspection under fluoroscopy shows that the sesamoids  cannot fully rotate under the 1st metatarsal head.   At this point a linear incision is placed in the  right 1st interspace 15. Blade was used to deepen incision down through the subcutaneous tissue.  Metzenbaum scissors were used to bluntly dissect to the lateral capsule of the right foot. A 15. Blade is  then used to create a lateral capsulotomy and utilizing a J stroke both proximally and laterally the  suspensory sesamoidal ligament is released manipulation of the 1st metatarsophalangeal joint shows  good motion and adequate release of the lateral capsule. At this point the is joystick is then used to put  the 1st metatarsal through frontal plane motion and the sesamoids are observed to be placed back in its  normal anatomical position. At this point the 3.5 mm fulcrum is placed at the 1st metatarsal lateral base. A stab incision is then  placed at the dorsal aspect of the second metatarsal to allow placement of the the C-clamp. This is then  placed at the lateral 2nd metatarsal shaft and just distal to the medial ridge of the 1st metatarsal. The  C-clamp is then tightened to correct the intermetatarsal angle. At the same time the joystick is used to  rotate the 1st metatarsal and the right hallux was dorsiflexed to maintain the metatarsal in the sagittal  plane. Verification on the C-arm shows good correction of the bunion deformity in all 3 planes. A  smooth K-wire is then driven through the clamp to hold the corrected anatomic position. The speed  seeker is then placed in the joint and lateral aspect of the first metatarsal. At this point the gold cut  guide is placed over the speed seeker verification on the C-arm shows good placement of the cut guide  in line with the bisection of the 1st metatarsal. This was then secured in place with 3 short pins at this  point the saw  blade was then used to make the cuneiform cut followed by the 1st metatarsal base cut.  The cuts are made through and through the plantar cortex. At this point an osteotome was used to free  the plantar ligaments. The bone fragments are then removed in toto. At this point the joint distractor is  placed over the short pins further inspection is made to ensure no plantar bone is remaining. The  wound is then flushed copious amounts sterile saline. Utilizing a 2.0 drill fenestration of the osteotomy  site was achieved on the 1st metatarsal and medial cuneiform. The at this point the fulcrum is placed on  the lateral aspect. In the osteotomy is closed. Verification on the C-arm shows no elevation of the 1st metatarsal and good approximation of the osteotomy. At this point a 2.0 olive wire is used to  provisionally fixate the osteotomy. This was placed from the lateral aspect of the 1st metatarsal across  the medial cuneiform medially. A smooth K-wires and driven from medial to lateral. At this point the  distractor is removed and the short pins are removed also. Attention then directed at the dorsal lateral wound where a joint where a biplanar plate is placed at the lateral most aspect of the metatarsocuneiform joint  verification on the C-arm showed good placement and no  violation of the inter cuneiform joint laterally.  This locking plate was secured utilizing 2.7 locking screws in the inner holes with a 14 mm length  followed by the outer most screw holes with 12 mm length screws. At this point the medial plate is placed securing the 14 mm length screws in inner holes and the 12 mm  screws on the outer holes. Verification on the C-arm showed good placement of the plates which is at a  90 degree construct. Delete and flushsterile saline the periosteal and subcutaneous  tissues were closed with 3-0 Vicryl. Prior to closure of the deeper tissues Zyn Relieff analgesic is injected  utilizing  approximately 2 cc. Closure of the skin is achieved utilizing 3-0 Prolene with horizontal mattress  sutures. The 1st interspace incision is also closed with 3-0 Prolene as is the 2nd metatarsal incision.  Final radiographs show good anatomic reduction of the previous bunion deformity with good placement  of the plates. Marcaine is then injected as an infiltrative nerve block around the 1st metatarsal base the  wound is dressed with Adaptic 4x4s and Kerlix. An Ace wrap was then applied around the foot. The calf  tourniquet is then deflated and color returned to the digits of the right foot x5. At this point a posterior  splint is applied. The patient right the operating room with vital signs stable and neurovascular status intact.  She was given to the right second and third digit.  A linear incision was carried down from epidermal dermal junction down to the level of the extensor tendon.  A transverse tenotomy was performed to expose the head of the proximal phalanx head of second and third.  Using sagittal saw the head of the proximal phalanx was resected.  The incision was carried further down to the metatarsophalangeal joint.  At this time the joint itself was exposed and was released/capsulotomy was performed in standard technique with McGlamery elevator.  At this time adequate correction of hammertoe noted.  0.045 K wire was used to anterograde through the middle and distal phalanx followed by retrograde into the proximal phalanx into the metatarsal phalangeal joint head of each respective second and third digit.  Good correction alignment noted.  The tendon was repaired with 3-0 Monocryl the skin was repaired with 3-0 Prolene.  All bony prominences were adequately padded  At the conclusion of the procedure the patient was awoken from anesthesia and found to have tolerated the procedure well any complications. There were transferred to PACU with vital signs stable and vascular status intact.  Boneta Lucks, DPM

## 2022-06-28 ENCOUNTER — Ambulatory Visit (INDEPENDENT_AMBULATORY_CARE_PROVIDER_SITE_OTHER): Payer: Medicaid Other | Admitting: Podiatry

## 2022-06-28 ENCOUNTER — Ambulatory Visit (INDEPENDENT_AMBULATORY_CARE_PROVIDER_SITE_OTHER): Payer: Medicaid Other

## 2022-06-28 DIAGNOSIS — Z9889 Other specified postprocedural states: Secondary | ICD-10-CM

## 2022-06-28 DIAGNOSIS — M21611 Bunion of right foot: Secondary | ICD-10-CM | POA: Diagnosis not present

## 2022-06-28 NOTE — Progress Notes (Signed)
Subjective:  Patient ID: Denise Morales, female    DOB: [birth year],  MRN: [id number]  No chief complaint on file.   DOS: 06/05/22 Procedure: Right Lapidus fusion with hammertoe correction of second and third  62 y.o. female returns for post-op check.  Patient states she is doing well.  Minimal pain.  Ambulating with a cam boot.  Denies any other acute complaints  Review of Systems: Negative except as noted in the HPI. Denies N/V/F/Ch.  Past Medical History:  Diagnosis Date   Allergy    Anxiety    Arthritis    Depression    Fibromyalgia    GERD (gastroesophageal reflux disease)    Hyperlipidemia    Hypertension    Hypothyroidism    Melanoma ([place])    face   Thyroid disease     Current Outpatient Medications:    acetaminophen (TYLENOL) 500 MG tablet, Take 500 mg by mouth every 6 (six) hours as needed for mild pain., Disp: , Rfl:    allopurinol (ZYLOPRIM) 100 MG tablet, Take 100 mg by mouth daily., Disp: , Rfl:    busPIRone (BUSPAR) 10 MG tablet, Take 10 mg by mouth 3 (three) times daily., Disp: , Rfl:    celecoxib (CELEBREX) 200 MG capsule, Take 1 capsule (200 mg total) by mouth 2 (two) times daily between meals as needed., Disp: 60 capsule, Rfl: 3   cyclobenzaprine (FLEXERIL) 5 MG tablet, Take 1 tablet by mouth 3 (three) times daily as needed., Disp: , Rfl:    dicyclomine (BENTYL) 10 MG capsule, Take 10 mg by mouth every 6 (six) hours as needed for spasms., Disp: , Rfl:    diphenhydrAMINE (BENADRYL) 25 mg capsule, Take 25 mg by mouth daily., Disp: , Rfl:    escitalopram (LEXAPRO) 20 MG tablet, Take 1 tablet (20 mg total) by mouth daily., Disp: 90 tablet, Rfl: 1   ibuprofen (ADVIL) 800 MG tablet, Take 1 tablet (800 mg total) by mouth every 6 (six) hours as needed., Disp: 60 tablet, Rfl: 1   Krill Oil 500 MG CAPS, Take 1 capsule by mouth in the morning., Disp: , Rfl:    levothyroxine (SYNTHROID) 50 MCG tablet, Take 50 mcg by mouth daily., Disp: , Rfl:     lisinopril-hydrochlorothiazide (ZESTORETIC) 10-12.5 MG tablet, Take 1 tablet by mouth daily., Disp: 90 tablet, Rfl: 1   naproxen (EC NAPROSYN) 500 MG EC tablet, Take 500 mg by mouth 2 (two) times daily with a meal., Disp: , Rfl:    oxyCODONE-acetaminophen (PERCOCET) 5-325 MG tablet, Take 1 tablet by mouth every 4 (four) hours as needed for severe pain., Disp: 30 tablet, Rfl: 0   pantoprazole (PROTONIX) 40 MG tablet, TAKE 1 TABLET BY MOUTH TWICE DAILY BEFORE A MEAL, Disp: 60 tablet, Rfl: 2   pravastatin (PRAVACHOL) 40 MG tablet, Take 1 tablet (40 mg total) by mouth daily., Disp: 90 tablet, Rfl: 1   sucralfate (CARAFATE) 1 g tablet, TAKE 1 TABLET BY MOUTH THREE TIMES DAILY AS NEEDED FOR HEART BURN., Disp: 90 tablet, Rfl: 0   traMADol (ULTRAM) 50 MG tablet, Take 1 tablet (50 mg total) by mouth every 8 (eight) hours as needed., Disp: 30 tablet, Rfl: 0   Turmeric 500 MG CAPS, Take 500 mg by mouth daily., Disp: , Rfl:    Vitamin D-Vitamin K (VITAMIN K2-VITAMIN D3 PO), Take 1 tablet by mouth daily., Disp: , Rfl:    zolpidem (AMBIEN) 5 MG tablet, Take 5 mg by mouth at bedtime as needed., Disp: , Rfl:  Social History   Tobacco Use  Smoking Status Never  Smokeless Tobacco Never    Allergies  Allergen Reactions   Hydroxyzine     Other reaction(s): Hallucinations   Objective:  There were no vitals filed for this visit. There is no height or weight on file to calculate BMI. Constitutional Well developed. Well nourished.  Vascular Foot warm and well perfused. Capillary refill normal to all digits.   Neurologic Normal speech. Oriented to person, place, and time. Epicritic sensation to light touch grossly present bilaterally.  Dermatologic Skin healing well without signs of infection. Skin edges well coapted without signs of infection.  Skin completely epithelialized.  With slight reduction of sesamoid position and bunion deformity noted  Orthopedic: Tenderness to palpation noted about the  surgical site.   Radiographs: 3 views of skeletally mature the right foot: Hardware is intact no signs of backing out or loosening on the reduction of sesamoid position noted.  No other abnormalities noted. Assessment:   1. Bunion of right foot   2. Status post foot surgery    Plan:  Patient was evaluated and treated and all questions answered.  S/p foot surgery right -Progressing as expected post-operatively. -XR: See above -WB Status: Weightbearing as tolerated in surgical shoe -Sutures: Removed no clinical signs of dehiscence noted no complication noted. -Medications: None -Foot redressed.  No follow-ups on file.

## 2022-06-29 ENCOUNTER — Other Ambulatory Visit: Payer: Self-pay | Admitting: Physician Assistant

## 2022-06-29 DIAGNOSIS — K219 Gastro-esophageal reflux disease without esophagitis: Secondary | ICD-10-CM

## 2022-06-29 DIAGNOSIS — K2100 Gastro-esophageal reflux disease with esophagitis, without bleeding: Secondary | ICD-10-CM

## 2022-07-09 ENCOUNTER — Other Ambulatory Visit (INDEPENDENT_AMBULATORY_CARE_PROVIDER_SITE_OTHER): Payer: Medicaid Other | Admitting: Podiatry

## 2022-07-09 MED ORDER — CLINDAMYCIN HCL 300 MG PO CAPS: 300.0000 mg | ORAL_CAPSULE | Freq: Three times a day (TID) | ORAL | 0 refills | Status: AC

## 2022-07-09 NOTE — Progress Notes (Signed)
Pt called on call line and states having redness and pain in toes operated on by Dr. Posey Pronto 12/11. Has wires in toes and scheduled to have them removed Thursday. Was doing fine but states gotten red over past 2 days. Not on abx.   Had pt send me a picture of the foot. Erythema present to the 3rd toe with some edema. Per pt no drainage from the k wire.   Will send clindamycin 300 mg TID x 7 days  Encouraged pt to call Tuesday AM to be seen in office Tuesday.

## 2022-07-11 ENCOUNTER — Ambulatory Visit (INDEPENDENT_AMBULATORY_CARE_PROVIDER_SITE_OTHER): Payer: Medicaid Other | Admitting: Podiatry

## 2022-07-11 VITALS — BP 128/68

## 2022-07-11 DIAGNOSIS — Z9889 Other specified postprocedural states: Secondary | ICD-10-CM

## 2022-07-11 DIAGNOSIS — M21611 Bunion of right foot: Secondary | ICD-10-CM

## 2022-07-11 MED ORDER — MELOXICAM 15 MG PO TABS: 15.0000 mg | ORAL_TABLET | Freq: Every day | ORAL | 0 refills | Status: DC

## 2022-07-11 NOTE — Progress Notes (Signed)
Subjective:  Patient ID: Denise Morales, female    DOB: [birth year],  MRN: [id number]  Chief Complaint  Patient presents with   Routine Post Op    DOS: 06/05/22 Procedure: Right Lapidus fusion with hammertoe correction of second and third  62 y.o. female returns for post-op check.  Patient states she is doing well.  Minimal pain.  Ambulating with a cam boot.  Denies any other acute complaints  Review of Systems: Negative except as noted in the HPI. Denies N/V/F/Ch.  Past Medical History:  Diagnosis Date   Allergy    Anxiety    Arthritis    Depression    Fibromyalgia    GERD (gastroesophageal reflux disease)    Hyperlipidemia    Hypertension    Hypothyroidism    Melanoma ([place])    face   Thyroid disease     Current Outpatient Medications:    meloxicam (MOBIC) 15 MG tablet, Take 1 tablet (15 mg total) by mouth daily., Disp: 30 tablet, Rfl: 0   acetaminophen (TYLENOL) 500 MG tablet, Take 500 mg by mouth every 6 (six) hours as needed for mild pain., Disp: , Rfl:    allopurinol (ZYLOPRIM) 100 MG tablet, Take 100 mg by mouth daily., Disp: , Rfl:    busPIRone (BUSPAR) 10 MG tablet, Take 10 mg by mouth 3 (three) times daily., Disp: , Rfl:    celecoxib (CELEBREX) 200 MG capsule, Take 1 capsule (200 mg total) by mouth 2 (two) times daily between meals as needed., Disp: 60 capsule, Rfl: 3   cyclobenzaprine (FLEXERIL) 5 MG tablet, Take 1 tablet by mouth 3 (three) times daily as needed., Disp: , Rfl:    dicyclomine (BENTYL) 10 MG capsule, Take 10 mg by mouth every 6 (six) hours as needed for spasms., Disp: , Rfl:    diphenhydrAMINE (BENADRYL) 25 mg capsule, Take 25 mg by mouth daily., Disp: , Rfl:    doxycycline (VIBRA-TABS) 100 MG tablet, Take 1 tablet (100 mg total) by mouth 2 (two) times daily., Disp: 28 tablet, Rfl: 0   escitalopram (LEXAPRO) 20 MG tablet, Take 1 tablet (20 mg total) by mouth daily., Disp: 90 tablet, Rfl: 1   ibuprofen (ADVIL) 800 MG tablet, Take 1 tablet (800 mg  total) by mouth every 6 (six) hours as needed., Disp: 60 tablet, Rfl: 1   Krill Oil 500 MG CAPS, Take 1 capsule by mouth in the morning., Disp: , Rfl:    levothyroxine (SYNTHROID) 50 MCG tablet, Take 50 mcg by mouth daily., Disp: , Rfl:    lisinopril-hydrochlorothiazide (ZESTORETIC) 10-12.5 MG tablet, Take 1 tablet by mouth daily., Disp: 90 tablet, Rfl: 1   naproxen (EC NAPROSYN) 500 MG EC tablet, Take 500 mg by mouth 2 (two) times daily with a meal., Disp: , Rfl:    oxyCODONE-acetaminophen (PERCOCET) 5-325 MG tablet, Take 1 tablet by mouth every 4 (four) hours as needed for severe pain., Disp: 30 tablet, Rfl: 0   pantoprazole (PROTONIX) 40 MG tablet, TAKE 1 TABLET BY MOUTH TWICE DAILY BEFORE A MEAL, Disp: 60 tablet, Rfl: 0   pravastatin (PRAVACHOL) 40 MG tablet, Take 1 tablet (40 mg total) by mouth daily., Disp: 90 tablet, Rfl: 1   sucralfate (CARAFATE) 1 g tablet, TAKE 1 TABLET BY MOUTH THREE TIMES DAILY AS NEEDED FOR HEART BURN., Disp: 90 tablet, Rfl: 0   traMADol (ULTRAM) 50 MG tablet, Take 1 tablet (50 mg total) by mouth every 8 (eight) hours as needed., Disp: 30 tablet, Rfl: 0   Turmeric  500 MG CAPS, Take 500 mg by mouth daily., Disp: , Rfl:    Vitamin D-Vitamin K (VITAMIN K2-VITAMIN D3 PO), Take 1 tablet by mouth daily., Disp: , Rfl:    zolpidem (AMBIEN) 5 MG tablet, Take 5 mg by mouth at bedtime as needed., Disp: , Rfl:   Social History   Tobacco Use  Smoking Status Never  Smokeless Tobacco Never    Allergies  Allergen Reactions   Hydroxyzine     Other reaction(s): Hallucinations   Objective:   Vitals:   07/11/22 1317  BP: 128/68   There is no height or weight on file to calculate BMI. Constitutional Well developed. Well nourished.  Vascular Foot warm and well perfused. Capillary refill normal to all digits.   Neurologic Normal speech. Oriented to person, place, and time. Epicritic sensation to light touch grossly present bilaterally.  Dermatologic Skin healing well  without signs of infection. Skin edges well coapted without signs of infection.  Skin completely epithelialized.  Reduction assessment position and bunion deformity noted  Orthopedic: Mild tenderness to palpation noted about the surgical site.   Radiographs: 3 views of skeletally mature the right foot: Hardware is intact no signs of backing out or loosening on the reduction of sesamoid position noted.  No other abnormalities noted. Assessment:   No diagnosis found.  Plan:  Patient was evaluated and treated and all questions answered.  S/p foot surgery right -Progressing as expected post-operatively. -XR: See above -WB Status: Regular shoes weightbearing as tolerated -Sutures: None -Medications: None -Foot redressed.  No follow-ups on file.

## 2022-07-13 ENCOUNTER — Encounter: Payer: Medicaid Other | Admitting: Podiatry

## 2022-07-14 ENCOUNTER — Telehealth: Payer: Self-pay

## 2022-07-17 ENCOUNTER — Other Ambulatory Visit: Payer: Self-pay | Admitting: Podiatry

## 2022-07-17 MED ORDER — DOXYCYCLINE HYCLATE 100 MG PO TABS: 100.0000 mg | ORAL_TABLET | Freq: Two times a day (BID) | ORAL | 0 refills | Status: DC

## 2022-07-17 NOTE — Telephone Encounter (Signed)
Patient is calling for status of an antibiotic that was supposed to be sent to pharmacy. Called and she said that she jump the gun, pharmacy has called her for pick up.

## 2022-07-23 ENCOUNTER — Other Ambulatory Visit: Payer: Self-pay | Admitting: Physician Assistant

## 2022-07-23 DIAGNOSIS — K2100 Gastro-esophageal reflux disease with esophagitis, without bleeding: Secondary | ICD-10-CM

## 2022-07-27 ENCOUNTER — Other Ambulatory Visit: Payer: Self-pay | Admitting: Physician Assistant

## 2022-07-27 DIAGNOSIS — K2100 Gastro-esophageal reflux disease with esophagitis, without bleeding: Secondary | ICD-10-CM

## 2022-07-27 DIAGNOSIS — K219 Gastro-esophageal reflux disease without esophagitis: Secondary | ICD-10-CM

## 2022-08-01 ENCOUNTER — Ambulatory Visit: Payer: Self-pay

## 2022-08-01 ENCOUNTER — Ambulatory Visit: Payer: Medicaid Other | Admitting: Physical Medicine and Rehabilitation

## 2022-08-01 VITALS — BP 122/88 | HR 73

## 2022-08-01 DIAGNOSIS — M47816 Spondylosis without myelopathy or radiculopathy, lumbar region: Secondary | ICD-10-CM | POA: Diagnosis not present

## 2022-08-01 MED ORDER — BUPIVACAINE HCL 0.5 % IJ SOLN
3.0000 mL | Freq: Once | INTRAMUSCULAR | Status: AC
  Administered 2022-08-01: 3 mL

## 2022-08-01 NOTE — Patient Instructions (Signed)

## 2022-08-01 NOTE — Progress Notes (Signed)
Functional Pain Scale - descriptive words and definitions  No Pain (0)   No Pain/Loss of function  Average Pain  varies   +Driver, -BT, -Dye Allergies.  Lower and mid back pain with no radiation. Walking and standing makes pain worse

## 2022-08-02 ENCOUNTER — Other Ambulatory Visit: Payer: Self-pay | Admitting: Physical Medicine and Rehabilitation

## 2022-08-02 DIAGNOSIS — M47816 Spondylosis without myelopathy or radiculopathy, lumbar region: Secondary | ICD-10-CM

## 2022-08-02 DIAGNOSIS — G8929 Other chronic pain: Secondary | ICD-10-CM

## 2022-08-08 ENCOUNTER — Ambulatory Visit (INDEPENDENT_AMBULATORY_CARE_PROVIDER_SITE_OTHER): Payer: Medicaid Other

## 2022-08-08 ENCOUNTER — Other Ambulatory Visit: Payer: Self-pay

## 2022-08-08 ENCOUNTER — Ambulatory Visit: Payer: Medicaid Other | Admitting: Orthopaedic Surgery

## 2022-08-08 DIAGNOSIS — M25511 Pain in right shoulder: Secondary | ICD-10-CM | POA: Diagnosis not present

## 2022-08-08 DIAGNOSIS — G8929 Other chronic pain: Secondary | ICD-10-CM | POA: Diagnosis not present

## 2022-08-08 DIAGNOSIS — M25512 Pain in left shoulder: Secondary | ICD-10-CM

## 2022-08-08 NOTE — Progress Notes (Signed)
Denise Morales - 62 y.o. female MRN [id number]  Date of birth: [birth year]  Office Visit Note: Visit Date: 08/01/2022 PCP: Everardo Beals, NP Referred by: Everardo Beals, NP  Subjective: Chief Complaint  Patient presents with   Lower Back - Pain   HPI:  Denise Morales is a 62 y.o. female who comes in today for planned repeat Bilateral L4-5 and L5-S1 Lumbar facet/medial branch block with fluoroscopic guidance.  The patient has failed conservative care including home exercise, medications, time and activity modification.  This injection will be diagnostic and hopefully therapeutic.  Please see requesting physician notes for further details and justification.  Exam shows concordant low back pain with facet joint loading and extension. Patient received more than 80% pain relief from prior injection. This would be the second block in a diagnostic double block paradigm.     Referring:Megan Jimmye Norman, FNP   ROS Otherwise per HPI.  Assessment & Plan: Visit Diagnoses:    ICD-10-CM   1. Spondylosis without myelopathy or radiculopathy, lumbar region  M47.816 XR C-ARM NO REPORT    Facet Injection    bupivacaine (MARCAINE) 0.5 % (with pres) injection 3 mL      Plan: No additional findings.   Meds & Orders:  Meds ordered this encounter  Medications   bupivacaine (MARCAINE) 0.5 % (with pres) injection 3 mL    Orders Placed This Encounter  Procedures   Facet Injection   XR C-ARM NO REPORT    Follow-up: Return for Review Pain Diary.   Procedures: No procedures performed  Lumbar Diagnostic Facet Joint Nerve Block with Fluoroscopic Guidance   Patient: Denise Morales      Date of Birth: September 06, 1960 MRN: [id number] PCP: Everardo Beals, NP      Visit Date: 08/01/2022   Universal Protocol:    Date/Time: 02/13/245:27 AM  Consent Given By: the patient  Position: PRONE  Additional Comments: Vital signs were monitored before and after the procedure. Patient was prepped  and draped in the usual sterile fashion. The correct patient, procedure, and site was verified.   Injection Procedure Details:   Procedure diagnoses:  1. Spondylosis without myelopathy or radiculopathy, lumbar region      Meds Administered:  Meds ordered this encounter  Medications   bupivacaine (MARCAINE) 0.5 % (with pres) injection 3 mL     Laterality: Bilateral  Location/Site: L4-L5, L3 and L4 medial branches and L5-S1, L4 medial branch and L5 dorsal ramus  Needle: 5.0 in., 25 ga.  Short bevel or Quincke spinal needle  Needle Placement: Oblique pedical  Findings:   -Comments: There was excellent flow of contrast along the articular pillars without intravascular flow.  Procedure Details: The fluoroscope beam is vertically oriented in AP and then obliqued 15 to 20 degrees to the ipsilateral side of the desired nerve to achieve the "Scotty dog" appearance.  The skin over the target area of the junction of the superior articulating process and the transverse process (sacral ala if blocking the L5 dorsal rami) was locally anesthetized with a 1 ml volume of 1% Lidocaine without Epinephrine.  The spinal needle was inserted and advanced in a trajectory view down to the target.   After contact with periosteum and negative aspirate for blood and CSF, correct placement without intravascular or epidural spread was confirmed by injecting 0.5 ml. of Isovue-250.  A spot radiograph was obtained of this image.    Next, a 0.5 ml. volume of the injectate described above was injected. The needle was then redirected  to the other facet joint nerves mentioned above if needed.  Prior to the procedure, the patient was given a Pain Diary which was completed for baseline measurements.  After the procedure, the patient rated their pain every 30 minutes and will continue rating at this frequency for a total of 5 hours.  The patient has been asked to complete the Diary and return to [place] by mail, fax or hand  delivered as soon as possible.   Additional Comments:  No complications occurred Dressing: 2 x 2 sterile gauze and Band-Aid    Post-procedure details: Patient was observed during the procedure. Post-procedure instructions were reviewed.  Patient left the clinic in stable condition.   Clinical History: EXAM: MRI LUMBAR SPINE WITHOUT CONTRAST   TECHNIQUE: Multiplanar, multisequence MR imaging of the lumbar spine was performed. No intravenous contrast was administered.   COMPARISON:  None.   FINDINGS: Segmentation:  Standard.   Alignment:  Physiologic.   Vertebrae:  No fracture, evidence of discitis, or bone lesion.   Conus medullaris and cauda equina: Conus extends to the T12 level. Conus and cauda equina appear normal.   Paraspinal and other soft tissues: Negative.   Disc levels:   T12-L1: No spinal canal or neural foraminal stenosis.   L1-2: Shallow disc bulge. No spinal canal or neural foraminal stenosis.   L2-3: Shallow disc bulge and mild facet degenerative changes. No spinal canal neural foraminal stenosis.   L3-4: Shallow disc bulge and mild facet degenerative changes. No significant spinal canal neural foraminal stenosis.   L4-5: Shallow disc bulge, advanced facet degenerative changes ligamentum flavum redundancy without significant spinal canal or neural foraminal stenosis.   L5-S1: Advanced hypertrophic facet degenerative changes, more pronounced on the right side without significant spinal canal or neural foraminal stenosis.   IMPRESSION: 1. Advanced hypertrophic facet degenerative changes at the L4-5 and L5-S1 levels, which may be a source of low back pain. 2. Mild multilevel degenerative disc disease without significant spinal canal or neural foraminal stenosis at any level.     Electronically Signed   By: Pedro Earls M.D.   On: 06/14/2020 17:10     Objective:  VS:  HT:    WT:   BMI:     BP:122/88  HR:73bpm  TEMP:  ( )  RESP:  Physical Exam Vitals and nursing note reviewed.  Constitutional:      General: She is not in acute distress.    Appearance: Normal appearance. She is not ill-appearing.  HENT:     Head: Normocephalic and atraumatic.     Right Ear: External ear normal.     Left Ear: External ear normal.  Eyes:     Extraocular Movements: Extraocular movements intact.  Cardiovascular:     Rate and Rhythm: Normal rate.     Pulses: Normal pulses.  Pulmonary:     Effort: Pulmonary effort is normal. No respiratory distress.  Abdominal:     General: There is no distension.     Palpations: Abdomen is soft.  Musculoskeletal:        General: Tenderness present.     Cervical back: Neck supple.     Right lower leg: No edema.     Left lower leg: No edema.     Comments: Patient has good distal strength with no pain over the greater trochanters.  No clonus or focal weakness.  Skin:    Findings: No erythema, lesion or rash.  Neurological:     General: No focal deficit  present.     Mental Status: She is alert and oriented to person, place, and time.     Sensory: No sensory deficit.     Motor: No weakness or abnormal muscle tone.     Coordination: Coordination normal.  Psychiatric:        Mood and Affect: Mood normal.        Behavior: Behavior normal.      Imaging: No results found.

## 2022-08-08 NOTE — Procedures (Signed)
Lumbar Diagnostic Facet Joint Nerve Block with Fluoroscopic Guidance   Patient: Denise Morales      Date of Birth: Jul 01, 1960 MRN: [id number] PCP: Everardo Beals, NP      Visit Date: 08/01/2022   Universal Protocol:    Date/Time: 02/13/245:27 AM  Consent Given By: the patient  Position: PRONE  Additional Comments: Vital signs were monitored before and after the procedure. Patient was prepped and draped in the usual sterile fashion. The correct patient, procedure, and site was verified.   Injection Procedure Details:   Procedure diagnoses:  1. Spondylosis without myelopathy or radiculopathy, lumbar region      Meds Administered:  Meds ordered this encounter  Medications   bupivacaine (MARCAINE) 0.5 % (with pres) injection 3 mL     Laterality: Bilateral  Location/Site: L4-L5, L3 and L4 medial branches and L5-S1, L4 medial branch and L5 dorsal ramus  Needle: 5.0 in., 25 ga.  Short bevel or Quincke spinal needle  Needle Placement: Oblique pedical  Findings:   -Comments: There was excellent flow of contrast along the articular pillars without intravascular flow.  Procedure Details: The fluoroscope beam is vertically oriented in AP and then obliqued 15 to 20 degrees to the ipsilateral side of the desired nerve to achieve the "Scotty dog" appearance.  The skin over the target area of the junction of the superior articulating process and the transverse process (sacral ala if blocking the L5 dorsal rami) was locally anesthetized with a 1 ml volume of 1% Lidocaine without Epinephrine.  The spinal needle was inserted and advanced in a trajectory view down to the target.   After contact with periosteum and negative aspirate for blood and CSF, correct placement without intravascular or epidural spread was confirmed by injecting 0.5 ml. of Isovue-250.  A spot radiograph was obtained of this image.    Next, a 0.5 ml. volume of the injectate described above was injected. The  needle was then redirected to the other facet joint nerves mentioned above if needed.  Prior to the procedure, the patient was given a Pain Diary which was completed for baseline measurements.  After the procedure, the patient rated their pain every 30 minutes and will continue rating at this frequency for a total of 5 hours.  The patient has been asked to complete the Diary and return to [place] by mail, fax or hand delivered as soon as possible.   Additional Comments:  No complications occurred Dressing: 2 x 2 sterile gauze and Band-Aid    Post-procedure details: Patient was observed during the procedure. Post-procedure instructions were reviewed.  Patient left the clinic in stable condition.
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
## 2022-08-09 ENCOUNTER — Ambulatory Visit: Payer: Self-pay

## 2022-08-09 ENCOUNTER — Ambulatory Visit: Payer: Medicaid Other | Admitting: Sports Medicine

## 2022-08-09 ENCOUNTER — Encounter: Payer: Self-pay | Admitting: Sports Medicine

## 2022-08-09 DIAGNOSIS — M25512 Pain in left shoulder: Secondary | ICD-10-CM

## 2022-08-09 DIAGNOSIS — G8929 Other chronic pain: Secondary | ICD-10-CM | POA: Diagnosis not present

## 2022-08-09 DIAGNOSIS — M19012 Primary osteoarthritis, left shoulder: Secondary | ICD-10-CM | POA: Diagnosis not present

## 2022-08-09 MED ORDER — BUPIVACAINE HCL 0.25 % IJ SOLN
2.0000 mL | INTRAMUSCULAR | Status: AC | PRN
  Administered 2022-08-09: 2 mL via INTRA_ARTICULAR

## 2022-08-09 MED ORDER — LIDOCAINE HCL 1 % IJ SOLN
2.0000 mL | INTRAMUSCULAR | Status: AC | PRN
  Administered 2022-08-09: 2 mL

## 2022-08-09 MED ORDER — METHYLPREDNISOLONE ACETATE 40 MG/ML IJ SUSP
80.0000 mg | INTRAMUSCULAR | Status: AC | PRN
  Administered 2022-08-09: 80 mg via INTRA_ARTICULAR

## 2022-08-09 NOTE — Progress Notes (Signed)
    Procedure Note  Patient: Denise Morales             Date of Birth: 1961-03-29           MRN: [id number]             Visit Date: 08/09/2022  Procedures: Visit Diagnoses:  1. Primary osteoarthritis, left shoulder   2. Chronic left shoulder pain    Large Joint Inj: L glenohumeral on 08/09/2022 8:49 AM Indications: pain Details: 22 G 3.5 in needle, ultrasound-guided posterior approach Medications: 2 mL lidocaine 1 %; 2 mL bupivacaine 0.25 %; 80 mg methylPREDNISolone acetate 40 MG/ML Outcome: tolerated well, no immediate complications  US-guided glenohumeral joint injection, left shoulder After discussion on risks/benefits/indications, informed verbal consent was obtained. A timeout was then performed. The patient was positioned lying lateral recumbent on examination table. The patient's shoulder was prepped with betadine and multiple alcohol swabs and utilizing ultrasound guidance, the patient's glenohumeral joint was identified on ultrasound. Using ultrasound guidance a 22-gauge, 3.5 inch needle with a mixture of 2:2:2 cc's lidocaine:bupivicaine:depomedrol was directed from a lateral to medial direction via in-plane technique into the glenohumeral joint with visualization of appropriate spread of injectate into the joint. Patient tolerated the procedure well without immediate complications.      Procedure, treatment alternatives, risks and benefits explained, specific risks discussed. Consent was given by the patient. Immediately prior to procedure a time out was called to verify the correct patient, procedure, equipment, support staff and site/side marked as required. Patient was prepped and draped in the usual sterile fashion.    - I evaluated the patient about 10 minutes post-injection and she had excellent improvement in pain following - follow-up with Artis Delay and Dr. [name] as indicated; I am happy to see them as needed  Elba Barman, DO [facility]  This note was dictated using Dragon naturally speaking software and may contain errors in syntax, spelling, or content which have not been identified prior to signing this note.

## 2022-08-21 ENCOUNTER — Ambulatory Visit: Payer: Self-pay

## 2022-08-21 ENCOUNTER — Ambulatory Visit: Payer: Medicaid Other | Admitting: Orthopaedic Surgery

## 2022-08-21 ENCOUNTER — Ambulatory Visit: Payer: Medicaid Other | Admitting: Physical Medicine and Rehabilitation

## 2022-08-21 VITALS — BP 138/78 | HR 75

## 2022-08-21 DIAGNOSIS — M47816 Spondylosis without myelopathy or radiculopathy, lumbar region: Secondary | ICD-10-CM | POA: Diagnosis not present

## 2022-08-21 MED ORDER — METHYLPREDNISOLONE ACETATE 80 MG/ML IJ SUSP
80.0000 mg | Freq: Once | INTRAMUSCULAR | Status: AC
  Administered 2022-08-21: 80 mg

## 2022-08-21 NOTE — Patient Instructions (Signed)

## 2022-08-21 NOTE — Progress Notes (Unsigned)
Functional Pain Scale - descriptive words and definitions  No Pain (0)   No Pain/Loss of function  Average Pain  can get up to an 8   +Driver, -BT, -Dye Allergies.  Lower back pain

## 2022-08-23 NOTE — Progress Notes (Signed)
Denise Morales - 62 y.o. female MRN [id number]  Date of birth: 1960-10-15  Office Visit Note: Visit Date: 08/21/2022 PCP: Everardo Beals, NP Referred by: Everardo Beals, NP  Subjective: Chief Complaint  Patient presents with   Lower Back - Pain   HPI:  Denise Morales is a 62 y.o. female who comes in todayfor planned radiofrequency ablation of the Right L4-5 and L5-S1 Lumbar facet joints. This would be ablation of the corresponding medial branches and/or dorsal rami.  Patient has had double diagnostic blocks with more than 50% relief.  These are documented on pain diary.  They have had chronic back pain for quite some time, more than 3 months, which has been an ongoing situation with recalcitrant axial back pain.  They have no radicular pain.  Their axial pain is worse with standing and ambulating and on exam today with facet loading.  They have had physical therapy as well as home exercise program.  The imaging noted in the chart below indicated facet pathology. Accordingly they meet all the criteria and qualification for for radiofrequency ablation and we are going to complete this today hopefully for more longer term relief as part of comprehensive management program.  She continues to report upper thoracic mid back pain and muscular pain.  She receives massaging from her husband sometimes but she feels like he is a little bit rough.  Her symptoms seem to be more myofascial and she does have a diagnosis of fibromyalgia.  We talked about this briefly.  Depending on relief with the ablation for the lower back we could look at further evaluation of her upper thoracic region.  She has had no falls or trauma no reason to suspect fracture.   ROS Otherwise per HPI.  Assessment & Plan: Visit Diagnoses:    ICD-10-CM   1. Spondylosis without myelopathy or radiculopathy, lumbar region  M47.816 XR C-ARM NO REPORT    Radiofrequency,Lumbar    methylPREDNISolone acetate (DEPO-MEDROL) injection  80 mg      Plan: No additional findings.   Meds & Orders:  Meds ordered this encounter  Medications   methylPREDNISolone acetate (DEPO-MEDROL) injection 80 mg    Orders Placed This Encounter  Procedures   Radiofrequency,Lumbar   XR C-ARM NO REPORT    Follow-up: Return if symptoms worsen or fail to improve.   Procedures: No procedures performed  Lumbar Facet Joint Nerve Denervation  Patient: Denise Morales      Date of Birth: Jun 09, 1961 MRN: [id number] PCP: Everardo Beals, NP      Visit Date: 08/21/2022   Universal Protocol:    Date/Time: 02/28/247:46 AM  Consent Given By: the patient  Position: PRONE  Additional Comments: Vital signs were monitored before and after the procedure. Patient was prepped and draped in the usual sterile fashion. The correct patient, procedure, and site was verified.   Injection Procedure Details:   Procedure diagnoses:  1. Spondylosis without myelopathy or radiculopathy, lumbar region      Meds Administered:  Meds ordered this encounter  Medications   methylPREDNISolone acetate (DEPO-MEDROL) injection 80 mg     Laterality: Right  Location/Site:  L4-L5, L3 and L4 medial branches and L5-S1, L4 medial branch and L5 dorsal ramus  Needle: 18 ga.,  58m active tip, 10100mRF Cannula  Needle Placement: Along juncture of superior articular process and transverse pocess  Findings:  -Comments:  Procedure Details: For each desired target nerve, the corresponding transverse process (sacral ala for the L5 dorsal rami) was identified and  the fluoroscope was positioned to square off the endplates of the corresponding vertebral body to achieve a true AP midline view.  The beam was then obliqued 15 to 20 degrees and caudally tilted 15 to 20 degrees to line up a trajectory along the target nerves. The skin over the target of the junction of superior articulating process and transverse process (sacral ala for the L5 dorsal rami) was  infiltrated with 57m of 1% Lidocaine without Epinephrine.  The 18 gauge 187mactive tip outer cannula was advanced in trajectory view to the target.  This procedure was repeated for each target nerve.  Then, for all levels, the outer cannula placement was fine-tuned and the position was then confirmed with bi-planar imaging.    Test stimulation was done both at sensory and motor levels to ensure there was no radicular stimulation. The target tissues were then infiltrated with 1 ml of 1% Lidocaine without Epinephrine. Subsequently, a percutaneous neurotomy was carried out for 90 seconds at 80 degrees Celsius.  After the completion of the lesion, 1 ml of injectate was delivered. It was then repeated for each facet joint nerve mentioned above. Appropriate radiographs were obtained to verify the probe placement during the neurotomy.   Additional Comments:  No complications occurred Dressing: 2 x 2 sterile gauze and Band-Aid    Post-procedure details: Patient was observed during the procedure. Post-procedure instructions were reviewed.  Patient left the clinic in stable condition.      Clinical History: EXAM: MRI LUMBAR SPINE WITHOUT CONTRAST   TECHNIQUE: Multiplanar, multisequence MR imaging of the lumbar spine was performed. No intravenous contrast was administered.   COMPARISON:  None.   FINDINGS: Segmentation:  Standard.   Alignment:  Physiologic.   Vertebrae:  No fracture, evidence of discitis, or bone lesion.   Conus medullaris and cauda equina: Conus extends to the T12 level. Conus and cauda equina appear normal.   Paraspinal and other soft tissues: Negative.   Disc levels:   T12-L1: No spinal canal or neural foraminal stenosis.   L1-2: Shallow disc bulge. No spinal canal or neural foraminal stenosis.   L2-3: Shallow disc bulge and mild facet degenerative changes. No spinal canal neural foraminal stenosis.   L3-4: Shallow disc bulge and mild facet degenerative  changes. No significant spinal canal neural foraminal stenosis.   L4-5: Shallow disc bulge, advanced facet degenerative changes ligamentum flavum redundancy without significant spinal canal or neural foraminal stenosis.   L5-S1: Advanced hypertrophic facet degenerative changes, more pronounced on the right side without significant spinal canal or neural foraminal stenosis.   IMPRESSION: 1. Advanced hypertrophic facet degenerative changes at the L4-5 and L5-S1 levels, which may be a source of low back pain. 2. Mild multilevel degenerative disc disease without significant spinal canal or neural foraminal stenosis at any level.     Electronically Signed   By: KaPedro Earls.D.   On: 06/14/2020 17:10     Objective:  VS:  HT:    WT:   BMI:     BP:138/78  HR:75bpm  TEMP: ( )  RESP:  Physical Exam Vitals and nursing note reviewed.  Constitutional:      General: She is not in acute distress.    Appearance: Normal appearance. She is not ill-appearing.  HENT:     Head: Normocephalic and atraumatic.     Right Ear: External ear normal.     Left Ear: External ear normal.  Eyes:     Extraocular Movements: Extraocular movements  intact.  Cardiovascular:     Rate and Rhythm: Normal rate.     Pulses: Normal pulses.  Pulmonary:     Effort: Pulmonary effort is normal. No respiratory distress.  Abdominal:     General: There is no distension.     Palpations: Abdomen is soft.  Musculoskeletal:        General: Tenderness present.     Cervical back: Neck supple.     Right lower leg: No edema.     Left lower leg: No edema.     Comments: Patient has good distal strength with no pain over the greater trochanters.  No clonus or focal weakness.  Skin:    Findings: No erythema, lesion or rash.  Neurological:     General: No focal deficit present.     Mental Status: She is alert and oriented to person, place, and time.     Sensory: No sensory deficit.     Motor: No  weakness or abnormal muscle tone.     Coordination: Coordination normal.  Psychiatric:        Mood and Affect: Mood normal.        Behavior: Behavior normal.      Imaging: No results found.

## 2022-08-23 NOTE — Procedures (Signed)
Lumbar Facet Joint Nerve Denervation  Patient: Denise Morales      Date of Birth: 1960-11-11 MRN: [id number] PCP: Everardo Beals, NP      Visit Date: 08/21/2022   Universal Protocol:    Date/Time: 02/28/247:46 AM  Consent Given By: the patient  Position: PRONE  Additional Comments: Vital signs were monitored before and after the procedure. Patient was prepped and draped in the usual sterile fashion. The correct patient, procedure, and site was verified.   Injection Procedure Details:   Procedure diagnoses:  1. Spondylosis without myelopathy or radiculopathy, lumbar region      Meds Administered:  Meds ordered this encounter  Medications   methylPREDNISolone acetate (DEPO-MEDROL) injection 80 mg     Laterality: Right  Location/Site:  L4-L5, L3 and L4 medial branches and L5-S1, L4 medial branch and L5 dorsal ramus  Needle: 18 ga.,  72m active tip, 1043mRF Cannula  Needle Placement: Along juncture of superior articular process and transverse pocess  Findings:  -Comments:  Procedure Details: For each desired target nerve, the corresponding transverse process (sacral ala for the L5 dorsal rami) was identified and the fluoroscope was positioned to square off the endplates of the corresponding vertebral body to achieve a true AP midline view.  The beam was then obliqued 15 to 20 degrees and caudally tilted 15 to 20 degrees to line up a trajectory along the target nerves. The skin over the target of the junction of superior articulating process and transverse process (sacral ala for the L5 dorsal rami) was infiltrated with 64m93mf 1% Lidocaine without Epinephrine.  The 18 gauge 49m23mtive tip outer cannula was advanced in trajectory view to the target.  This procedure was repeated for each target nerve.  Then, for all levels, the outer cannula placement was fine-tuned and the position was then confirmed with bi-planar imaging.    Test stimulation was done both at sensory  and motor levels to ensure there was no radicular stimulation. The target tissues were then infiltrated with 1 ml of 1% Lidocaine without Epinephrine. Subsequently, a percutaneous neurotomy was carried out for 90 seconds at 80 degrees Celsius.  After the completion of the lesion, 1 ml of injectate was delivered. It was then repeated for each facet joint nerve mentioned above. Appropriate radiographs were obtained to verify the probe placement during the neurotomy.   Additional Comments:  No complications occurred Dressing: 2 x 2 sterile gauze and Band-Aid    Post-procedure details: Patient was observed during the procedure. Post-procedure instructions were reviewed.  Patient left the clinic in stable condition.

## 2022-08-24 ENCOUNTER — Other Ambulatory Visit: Payer: Self-pay | Admitting: Physician Assistant

## 2022-08-24 DIAGNOSIS — K2100 Gastro-esophageal reflux disease with esophagitis, without bleeding: Secondary | ICD-10-CM

## 2022-08-28 ENCOUNTER — Ambulatory Visit: Payer: Self-pay

## 2022-08-28 ENCOUNTER — Ambulatory Visit: Payer: Medicaid Other | Admitting: Physical Medicine and Rehabilitation

## 2022-08-28 ENCOUNTER — Ambulatory Visit
Admission: RE | Admit: 2022-08-28 | Discharge: 2022-08-28 | Disposition: A | Discharge status: Home / Self Care | Payer: Medicaid Other | Source: Ambulatory Visit | Attending: Orthopaedic Surgery | Admitting: Orthopaedic Surgery

## 2022-08-28 VITALS — BP 117/74 | HR 86

## 2022-08-28 DIAGNOSIS — M47816 Spondylosis without myelopathy or radiculopathy, lumbar region: Secondary | ICD-10-CM

## 2022-08-28 DIAGNOSIS — G8929 Other chronic pain: Secondary | ICD-10-CM

## 2022-08-28 MED ORDER — METHYLPREDNISOLONE ACETATE 80 MG/ML IJ SUSP
80.0000 mg | Freq: Once | INTRAMUSCULAR | Status: AC
  Administered 2022-08-28: 80 mg

## 2022-08-28 NOTE — Patient Instructions (Signed)

## 2022-08-28 NOTE — Progress Notes (Signed)
Functional Pain Scale - descriptive words and definitions  No Pain (0)   No Pain/Loss of function  Average Pain 0   +Driver, -BT, -Dye Allergies.  Lower back pain

## 2022-09-04 ENCOUNTER — Encounter: Payer: Self-pay | Admitting: Physician Assistant

## 2022-09-04 ENCOUNTER — Ambulatory Visit (INDEPENDENT_AMBULATORY_CARE_PROVIDER_SITE_OTHER): Payer: Medicaid Other | Admitting: Physician Assistant

## 2022-09-04 DIAGNOSIS — S46011D Strain of muscle(s) and tendon(s) of the rotator cuff of right shoulder, subsequent encounter: Secondary | ICD-10-CM

## 2022-09-04 NOTE — Progress Notes (Signed)
Denise Morales - 62 y.o. female MRN [id number]  Date of birth: October 12, 1960  Office Visit Note: Visit Date: 08/28/2022 PCP: Everardo Beals, NP Referred by: Everardo Beals, NP  Subjective: Chief Complaint  Patient presents with   Lower Back - Pain   HPI:  Denise Morales is a 62 y.o. female who comes in todayfor planned radiofrequency ablation of the Left L4-5 and L5-S1 Lumbar facet joints. This would be ablation of the corresponding medial branches and/or dorsal rami.  Patient has had double diagnostic blocks with more than 50% relief.  These are documented on pain diary.  They have had chronic back pain for quite some time, more than 3 months, which has been an ongoing situation with recalcitrant axial back pain.  They have no radicular pain.  Their axial pain is worse with standing and ambulating and on exam today with facet loading.  They have had physical therapy as well as home exercise program.  The imaging noted in the chart below indicated facet pathology. Accordingly they meet all the criteria and qualification for for radiofrequency ablation and we are going to complete this today hopefully for more longer term relief as part of comprehensive management program.   ROS Otherwise per HPI.  Assessment & Plan: Visit Diagnoses:    ICD-10-CM   1. Spondylosis without myelopathy or radiculopathy, lumbar region  M47.816 XR C-ARM NO REPORT    Radiofrequency,Lumbar    methylPREDNISolone acetate (DEPO-MEDROL) injection 80 mg      Plan: No additional findings.   Meds & Orders:  Meds ordered this encounter  Medications   methylPREDNISolone acetate (DEPO-MEDROL) injection 80 mg    Orders Placed This Encounter  Procedures   Radiofrequency,Lumbar   XR C-ARM NO REPORT    Follow-up: Return for visit to requesting provider as needed.   Procedures: No procedures performed  Lumbar Facet Joint Nerve Denervation  Patient: Denise Morales      Date of Birth: Apr 23, 1961 MRN:  [id number] PCP: Everardo Beals, NP      Visit Date: 08/28/2022   Universal Protocol:    Date/Time: 03/11/245:32 AM  Consent Given By: the patient  Position: PRONE  Additional Comments: Vital signs were monitored before and after the procedure. Patient was prepped and draped in the usual sterile fashion. The correct patient, procedure, and site was verified.   Injection Procedure Details:   Procedure diagnoses:  1. Spondylosis without myelopathy or radiculopathy, lumbar region      Meds Administered:  Meds ordered this encounter  Medications   methylPREDNISolone acetate (DEPO-MEDROL) injection 80 mg     Laterality: Left  Location/Site:  L4-L5, L3 and L4 medial branches and L5-S1, L4 medial branch and L5 dorsal ramus  Needle: 18 ga.,  43m active tip, 1528mRF Cannula  Needle Placement: Along juncture of superior articular process and transverse pocess  Findings:  -Comments:  Procedure Details: For each desired target nerve, the corresponding transverse process (sacral ala for the L5 dorsal rami) was identified and the fluoroscope was positioned to square off the endplates of the corresponding vertebral body to achieve a true AP midline view.  The beam was then obliqued 15 to 20 degrees and caudally tilted 15 to 20 degrees to line up a trajectory along the target nerves. The skin over the target of the junction of superior articulating process and transverse process (sacral ala for the L5 dorsal rami) was infiltrated with 33m49mf 1% Lidocaine without Epinephrine.  The 18 gauge 24m59mtive tip outer cannula was  advanced in trajectory view to the target.  This procedure was repeated for each target nerve.  Then, for all levels, the outer cannula placement was fine-tuned and the position was then confirmed with bi-planar imaging.    Test stimulation was done both at sensory and motor levels to ensure there was no radicular stimulation. The target tissues were then  infiltrated with 1 ml of 1% Lidocaine without Epinephrine. Subsequently, a percutaneous neurotomy was carried out for 90 seconds at 80 degrees Celsius.  After the completion of the lesion, 1 ml of injectate was delivered. It was then repeated for each facet joint nerve mentioned above. Appropriate radiographs were obtained to verify the probe placement during the neurotomy.   Additional Comments:  No complications occurred Dressing: 2 x 2 sterile gauze and Band-Aid    Post-procedure details: Patient was observed during the procedure. Post-procedure instructions were reviewed.  Patient left the clinic in stable condition.      Clinical History: EXAM: MRI LUMBAR SPINE WITHOUT CONTRAST   TECHNIQUE: Multiplanar, multisequence MR imaging of the lumbar spine was performed. No intravenous contrast was administered.   COMPARISON:  None.   FINDINGS: Segmentation:  Standard.   Alignment:  Physiologic.   Vertebrae:  No fracture, evidence of discitis, or bone lesion.   Conus medullaris and cauda equina: Conus extends to the T12 level. Conus and cauda equina appear normal.   Paraspinal and other soft tissues: Negative.   Disc levels:   T12-L1: No spinal canal or neural foraminal stenosis.   L1-2: Shallow disc bulge. No spinal canal or neural foraminal stenosis.   L2-3: Shallow disc bulge and mild facet degenerative changes. No spinal canal neural foraminal stenosis.   L3-4: Shallow disc bulge and mild facet degenerative changes. No significant spinal canal neural foraminal stenosis.   L4-5: Shallow disc bulge, advanced facet degenerative changes ligamentum flavum redundancy without significant spinal canal or neural foraminal stenosis.   L5-S1: Advanced hypertrophic facet degenerative changes, more pronounced on the right side without significant spinal canal or neural foraminal stenosis.   IMPRESSION: 1. Advanced hypertrophic facet degenerative changes at the L4-5  and L5-S1 levels, which may be a source of low back pain. 2. Mild multilevel degenerative disc disease without significant spinal canal or neural foraminal stenosis at any level.     Electronically Signed   By: Pedro Earls M.D.   On: 06/14/2020 17:10     Objective:  VS:  HT:    WT:   BMI:     BP:117/74  HR:86bpm  TEMP: ( )  RESP:  Physical Exam Vitals and nursing note reviewed.  Constitutional:      General: She is not in acute distress.    Appearance: Normal appearance. She is not ill-appearing.  HENT:     Head: Normocephalic and atraumatic.     Right Ear: External ear normal.     Left Ear: External ear normal.  Eyes:     Extraocular Movements: Extraocular movements intact.  Cardiovascular:     Rate and Rhythm: Normal rate.     Pulses: Normal pulses.  Pulmonary:     Effort: Pulmonary effort is normal. No respiratory distress.  Abdominal:     General: There is no distension.     Palpations: Abdomen is soft.  Musculoskeletal:        General: Tenderness present.     Cervical back: Neck supple.     Right lower leg: No edema.     Left lower leg:  No edema.     Comments: Patient has good distal strength with no pain over the greater trochanters.  No clonus or focal weakness.  Skin:    Findings: No erythema, lesion or rash.  Neurological:     General: No focal deficit present.     Mental Status: She is alert and oriented to person, place, and time.     Sensory: No sensory deficit.     Motor: No weakness or abnormal muscle tone.     Coordination: Coordination normal.  Psychiatric:        Mood and Affect: Mood normal.        Behavior: Behavior normal.      Imaging: No results found.

## 2022-09-04 NOTE — Procedures (Signed)
Lumbar Facet Joint Nerve Denervation  Patient: Denise Morales      Date of Birth: 1961-01-11 MRN: [id number] PCP: Everardo Beals, NP      Visit Date: 08/28/2022   Universal Protocol:    Date/Time: 03/11/245:32 AM  Consent Given By: the patient  Position: PRONE  Additional Comments: Vital signs were monitored before and after the procedure. Patient was prepped and draped in the usual sterile fashion. The correct patient, procedure, and site was verified.   Injection Procedure Details:   Procedure diagnoses:  1. Spondylosis without myelopathy or radiculopathy, lumbar region      Meds Administered:  Meds ordered this encounter  Medications   methylPREDNISolone acetate (DEPO-MEDROL) injection 80 mg     Laterality: Left  Location/Site:  L4-L5, L3 and L4 medial branches and L5-S1, L4 medial branch and L5 dorsal ramus  Needle: 18 ga.,  13m active tip, 1529mRF Cannula  Needle Placement: Along juncture of superior articular process and transverse pocess  Findings:  -Comments:  Procedure Details: For each desired target nerve, the corresponding transverse process (sacral ala for the L5 dorsal rami) was identified and the fluoroscope was positioned to square off the endplates of the corresponding vertebral body to achieve a true AP midline view.  The beam was then obliqued 15 to 20 degrees and caudally tilted 15 to 20 degrees to line up a trajectory along the target nerves. The skin over the target of the junction of superior articulating process and transverse process (sacral ala for the L5 dorsal rami) was infiltrated with 3m33mf 1% Lidocaine without Epinephrine.  The 18 gauge 71m2mtive tip outer cannula was advanced in trajectory view to the target.  This procedure was repeated for each target nerve.  Then, for all levels, the outer cannula placement was fine-tuned and the position was then confirmed with bi-planar imaging.    Test stimulation was done both at sensory  and motor levels to ensure there was no radicular stimulation. The target tissues were then infiltrated with 1 ml of 1% Lidocaine without Epinephrine. Subsequently, a percutaneous neurotomy was carried out for 90 seconds at 80 degrees Celsius.  After the completion of the lesion, 1 ml of injectate was delivered. It was then repeated for each facet joint nerve mentioned above. Appropriate radiographs were obtained to verify the probe placement during the neurotomy.   Additional Comments:  No complications occurred Dressing: 2 x 2 sterile gauze and Band-Aid    Post-procedure details: Patient was observed during the procedure. Post-procedure instructions were reviewed.  Patient left the clinic in stable condition.

## 2022-09-04 NOTE — Progress Notes (Signed)
HPI: Denise Morales comes in today for follow-up of her right shoulder status post MRI.  She states that there have been no changes in the pain in her shoulder.  As noted previously she had a right shoulder arthroscopy with rotator cuff repair and subacromial decompression in February 2022 by Dr. Ninfa Morales.  She did well until fall in November 2023.  MRI MRIs reviewed with the patient.  MRI of the right shoulder dated 08/28/2022 shows full-thickness rotator cuff tear of the supraspinatus tendon with retraction to the glenoid.  Tear appears to be nearly full width.  Atrophy is seen of the supraspinatus muscle.  Mild to moderate glenohumeral and AC joint changes are moderate.   Physical exam: Right shoulder she has active full overhead motion.  5 out of 5 strength with external and internal rotation against resistance.  Empty can test is negative bilaterally today.  Impression: Recurrent right shoulder rotator cuff tear  Plan: Discussed the findings with her.  Given the fact that this is a recurrent tear I will discuss her case with Dr. Marlou Morales and see if he feels there is anything that could be done from his standpoint to repair the cuff.  Will call patient tomorrow after speaking with Dr. Marlou Morales.  Questions were encouraged and answered she was happy with this plan of care.

## 2022-09-06 ENCOUNTER — Other Ambulatory Visit: Payer: Self-pay

## 2022-09-06 ENCOUNTER — Telehealth: Payer: Self-pay

## 2022-09-06 DIAGNOSIS — S46011D Strain of muscle(s) and tendon(s) of the rotator cuff of right shoulder, subsequent encounter: Secondary | ICD-10-CM

## 2022-09-06 NOTE — Telephone Encounter (Signed)
Patient called about shoulder to discuss

## 2022-09-06 NOTE — Telephone Encounter (Signed)
Referral was placed in chart for Dr. Rolena Infante. Can you please call her to set up a follow-up with dr. Marlou Sa for two weeks after appt with dr. Rolena Infante

## 2022-09-12 ENCOUNTER — Ambulatory Visit: Payer: Medicaid Other | Admitting: Sports Medicine

## 2022-09-12 ENCOUNTER — Other Ambulatory Visit: Payer: Self-pay

## 2022-09-12 ENCOUNTER — Encounter: Payer: Self-pay | Admitting: Sports Medicine

## 2022-09-12 DIAGNOSIS — S46011D Strain of muscle(s) and tendon(s) of the rotator cuff of right shoulder, subsequent encounter: Secondary | ICD-10-CM

## 2022-09-12 DIAGNOSIS — M19011 Primary osteoarthritis, right shoulder: Secondary | ICD-10-CM | POA: Diagnosis not present

## 2022-09-12 DIAGNOSIS — M25511 Pain in right shoulder: Secondary | ICD-10-CM | POA: Diagnosis not present

## 2022-09-12 DIAGNOSIS — G8929 Other chronic pain: Secondary | ICD-10-CM

## 2022-09-12 MED ORDER — METHYLPREDNISOLONE ACETATE 40 MG/ML IJ SUSP
80.0000 mg | INTRAMUSCULAR | Status: AC | PRN
  Administered 2022-09-12: 80 mg via INTRA_ARTICULAR

## 2022-09-12 MED ORDER — BUPIVACAINE HCL 0.25 % IJ SOLN
2.0000 mL | INTRAMUSCULAR | Status: AC | PRN
  Administered 2022-09-12: 2 mL via INTRA_ARTICULAR

## 2022-09-12 MED ORDER — LIDOCAINE HCL 1 % IJ SOLN
2.0000 mL | INTRAMUSCULAR | Status: AC | PRN
  Administered 2022-09-12: 2 mL

## 2022-09-12 NOTE — Progress Notes (Signed)
   Procedure Note  Patient: Denise Morales             Date of Birth: 01/24/1961           MRN: [id number]             Visit Date: 09/12/2022  Procedures: Visit Diagnoses:  1. Primary osteoarthritis, right shoulder   2. Traumatic complete tear of right rotator cuff, subsequent encounter   3. Chronic right shoulder pain    Large Joint Inj: R glenohumeral on 09/12/2022 8:14 AM Indications: pain Details: 22 G 3.5 in needle, ultrasound-guided posterior approach Medications: 2 mL lidocaine 1 %; 2 mL bupivacaine 0.25 %; 80 mg methylPREDNISolone acetate 40 MG/ML Outcome: tolerated well, no immediate complications  US-guided glenohumeral joint injection, right   shoulder After discussion on risks/benefits/indications, informed verbal consent was obtained. A timeout was then performed. The patient was positioned lying lateral recumbent on examination table. The patient's shoulder was prepped with betadine and multiple alcohol swabs and utilizing ultrasound guidance, the patient's glenohumeral joint was identified on ultrasound. Using ultrasound guidance a 22-gauge, 3.5 inch needle with a mixture of 2:2:2 cc's lidocaine:bupivicaine:depomedrol was directed from a lateral to medial direction via in-plane technique into the glenohumeral joint with visualization of appropriate spread of injectate into the joint. Patient tolerated the procedure well without immediate complications.      Procedure, treatment alternatives, risks and benefits explained, specific risks discussed. Consent was given by the patient. Immediately prior to procedure a time out was called to verify the correct patient, procedure, equipment, support staff and site/side marked as required. Patient was prepped and draped in the usual sterile fashion.     - I evaluated the patient about 10 minutes post-injection and she had some improvement in pain and range of motion. Arm still feels "fatigued." - follow-up with Dr. Marlou Sa in 2  weeks as indicated; I am happy to see them as needed  Elba Barman, DO [place]  This note was dictated using Dragon naturally speaking software and may contain errors in syntax, spelling, or content which have not been identified prior to signing this note.

## 2022-09-27 ENCOUNTER — Ambulatory Visit: Payer: Medicaid Other | Admitting: Orthopedic Surgery

## 2022-09-27 DIAGNOSIS — M19011 Primary osteoarthritis, right shoulder: Secondary | ICD-10-CM | POA: Diagnosis not present

## 2022-09-30 ENCOUNTER — Encounter: Payer: Self-pay | Admitting: Orthopedic Surgery

## 2022-09-30 NOTE — Progress Notes (Signed)
Office Visit Note   Patient: Denise Morales           Date of Birth: 11/09/1960           MRN: [id number] Visit Date: 09/27/2022 Requested by: Marva PandaMillsaps, Kimberly, NP [address] [place] [id number] PCP: Marva PandaMillsaps, Kimberly, NP  Subjective: Chief Complaint  Patient presents with   Right Shoulder - Pain    HPI: Denise Morales is a 62 y.o. female who presents to the office reporting right shoulder pain.  Patient had a glenohumeral joint injection 319 which did not give very good relief.  Here to discuss options.  She has had chronic pain in the shoulder but it has been worse since the fall on 1123.  Patient is right-hand dominant.  Pain wakes her from sleep at night.  She reports right upper extremity weakness.  Also states the right arm feels heavy.  Describes popping but no neck pain or numbness and tingling.  She likes to push mow the yard.  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  Her mother has since passed away and she is able to get this shoulder issue addressed at this time.  She has troubles with the left shoulder as well.  Radiographs demonstrate arthritis along with decreased acromiohumeral interval.  Patient currently is not working.  Right shoulder MRI demonstrates chronic full-thickness full width tear of the supraspinatus with retraction to the glenoid with grade 2 atrophy of the supraspinatus muscle.  There is also mild to moderate chondrosis of the glenohumeral surface.  Mild AC joint arthritis is also present on the right-hand side..                ROS: All systems reviewed are negative as they relate to the chief complaint within the history of present illness.  Patient denies fevers or chills.  Assessment & Plan: Visit Diagnoses:  1. Primary osteoarthritis, right shoulder     Plan: Impression is right shoulder arthritis with rotator cuff arthropathy in unrepairable  rotator cuff tear.  Had a recent injection with Dr. Shon BatonBrooks.  Plan at this time is right reverse shoulder replacement.  Thin cut CT scan pending for patient specific instrumentation and planning.  Risk and benefits of the procedure discussed with the patient include not limited to infection or vessel damage incomplete pain relief as well as dislocation.  Patient understands the risk and benefits and wishes to proceed.  Rehabilitative process is also discussed.  All questions answered.  Plan for surgery sometime after June 7.  Follow-Up Instructions: No follow-ups on file.   Orders:  Orders Placed This Encounter  Procedures   CT SHOULDER RIGHT WO CONTRAST   No orders of the defined types were placed in this encounter.     Procedures: No procedures performed   Clinical Data: No additional findings.  Objective: Vital Signs: There were no vitals taken for this visit.  Physical Exam:  Constitutional: Patient appears well-developed HEENT:  Head: Normocephalic Eyes:EOM are normal Neck: Normal range of motion Cardiovascular: Normal rate Pulmonary/chest: Effort normal Neurologic: Patient is alert Skin: Skin is warm Psychiatric: Patient has normal mood and affect  Ortho Exam: Ortho exam demonstrates forward flexion actively above 90 degrees.  Passive range of motion of the right shoulder 70/110/165.  Has pretty good subscap strength with no Popeye deformity.  Biceps tendinitis and tenderness in the bicipital groove  is present.  No discrete AC joint tenderness on the right-hand side.  There is some coarseness with internal and external rotation of the right shoulder above 90 degrees of abduction.  Deltoid is functional.  No paresthesias in that right arm.  Specialty Comments:  EXAM: MRI LUMBAR SPINE WITHOUT CONTRAST   TECHNIQUE: Multiplanar, multisequence MR imaging of the lumbar spine was performed. No intravenous contrast was administered.   COMPARISON:  None.    FINDINGS: Segmentation:  Standard.   Alignment:  Physiologic.   Vertebrae:  No fracture, evidence of discitis, or bone lesion.   Conus medullaris and cauda equina: Conus extends to the T12 level. Conus and cauda equina appear normal.   Paraspinal and other soft tissues: Negative.   Disc levels:   T12-L1: No spinal canal or neural foraminal stenosis.   L1-2: Shallow disc bulge. No spinal canal or neural foraminal stenosis.   L2-3: Shallow disc bulge and mild facet degenerative changes. No spinal canal neural foraminal stenosis.   L3-4: Shallow disc bulge and mild facet degenerative changes. No significant spinal canal neural foraminal stenosis.   L4-5: Shallow disc bulge, advanced facet degenerative changes ligamentum flavum redundancy without significant spinal canal or neural foraminal stenosis.   L5-S1: Advanced hypertrophic facet degenerative changes, more pronounced on the right side without significant spinal canal or neural foraminal stenosis.   IMPRESSION: 1. Advanced hypertrophic facet degenerative changes at the L4-5 and L5-S1 levels, which may be a source of low back pain. 2. Mild multilevel degenerative disc disease without significant spinal canal or neural foraminal stenosis at any level.     Electronically Signed   By: Baldemar Lenis M.D.   On: 06/14/2020 17:10  Imaging: No results found.   PMFS History: Patient Active Problem List   Diagnosis Date Noted   Status post total left knee replacement 07/15/2021   Neoplasm of uncertain behavior of skin 05/25/2021   Generalized osteoarthritis of multiple sites 05/25/2021   Fibromyalgia syndrome 05/25/2021   Restless legs 03/16/2021   Hyperlipidemia 03/16/2021   Anxiety 03/16/2021   Status post right knee replacement 12/24/2020   Unilateral primary osteoarthritis, left knee 11/11/2020   Unilateral primary osteoarthritis, right knee 11/11/2020   Complete tear of right rotator cuff  08/12/2020   Hypertensive disorder 05/05/2019   Past Medical History:  Diagnosis Date   Allergy    Anxiety    Arthritis    Depression    Fibromyalgia    GERD (gastroesophageal reflux disease)    Hyperlipidemia    Hypertension    Hypothyroidism    Melanoma (HCC)    face   Thyroid disease     Family History  Problem Relation Age of Onset   Dementia Mother    Heart Problems Mother    Stroke Mother    Hypertension Mother    Hypertension Father    Colon polyps Father    Diabetes Father    Hypertension Sister    Diabetes Sister        borderline   Fibromyalgia Sister    Hypertension Sister    Colon polyps Sister    Diabetes Sister    Fibromyalgia Sister    Heart Problems Sister    Arthritis Sister    Hypertension Sister    Heart Problems Sister    Diabetes Paternal Aunt    Healthy Daughter    Colon cancer Neg Hx    Esophageal cancer Neg Hx    Rectal cancer Neg Hx  Stomach cancer Neg Hx     Past Surgical History:  Procedure Laterality Date   ABDOMINAL HYSTERECTOMY  1985   AIKEN OSTEOTOMY Right 06/05/2022   Procedure: Quintella Reichert OSTEOTOMY;  Surgeon: Candelaria Stagers, DPM;  Location: [facility];  Service: Podiatry;  Laterality: Right;   CAPSULOTOMY METATARSOPHALANGEAL Right 06/05/2022   Procedure: CAPSULOTOMY METATARSOPHALANGEAL;  Surgeon: Candelaria Stagers, DPM;  Location: [facility];  Service: Podiatry;  Laterality: Right;   CHOLECYSTECTOMY     COLONOSCOPY     HALLUX VALGUS LAPIDUS Right 06/05/2022   Procedure: HALLUX VALGUS LAPIDUS;  Surgeon: Candelaria Stagers, DPM;  Location: [facility];  Service: Podiatry;  Laterality: Right;   HAMMER TOE SURGERY Right 06/05/2022   Procedure: HAMMER TOE CORRECTION;  Surgeon: Candelaria Stagers, DPM;  Location[facility];  Service: Podiatry;  Laterality: Right;   SHOULDER ARTHROSCOPY WITH ROTATOR CUFF REPAIR AND SUBACROMIAL DECOMPRESSION Right 08/12/2020   Procedure: RIGHT  SHOULDER ARTHROSCOPY WITH ROTATOR CUFF REPAIR AND SUBACROMIAL DECOMPRESSION;  Surgeon: Kathryne Hitch, MD;  Location: [facility];  Service: Orthopedics;  Laterality: Right;   TOTAL KNEE ARTHROPLASTY Right 12/24/2020   Procedure: RIGHT TOTAL KNEE ARTHROPLASTY;  Surgeon: Kathryne Hitch, MD;  Location: [facility];  Service: Orthopedics;  Laterality: Right;   TOTAL KNEE ARTHROPLASTY Left 07/15/2021   Procedure: LEFT TOTAL KNEE ARTHROPLASTY;  Surgeon: Kathryne Hitch, MD;  Location: [facility];  Service: Orthopedics;  Laterality: Left;   Social History   Occupational History   Not on file  Tobacco Use   Smoking status: Never   Smokeless tobacco: Never  Vaping Use   Vaping Use: Never used  Substance and Sexual Activity   Alcohol use: Not Currently   Drug use: Never   Sexual activity: Not Currently

## 2022-10-09 ENCOUNTER — Emergency Department (HOSPITAL_BASED_OUTPATIENT_CLINIC_OR_DEPARTMENT_OTHER)
Admission: EM | Admit: 2022-10-09 | Discharge: 2022-10-09 | Disposition: A | Discharge status: Home / Self Care | Payer: Medicaid Other | Attending: Emergency Medicine | Admitting: Emergency Medicine

## 2022-10-09 ENCOUNTER — Emergency Department (HOSPITAL_BASED_OUTPATIENT_CLINIC_OR_DEPARTMENT_OTHER): Payer: Medicaid Other

## 2022-10-09 ENCOUNTER — Encounter (HOSPITAL_BASED_OUTPATIENT_CLINIC_OR_DEPARTMENT_OTHER): Payer: Self-pay | Admitting: Emergency Medicine

## 2022-10-09 DIAGNOSIS — R052 Subacute cough: Secondary | ICD-10-CM | POA: Diagnosis not present

## 2022-10-09 DIAGNOSIS — D72829 Elevated white blood cell count, unspecified: Secondary | ICD-10-CM | POA: Insufficient documentation

## 2022-10-09 DIAGNOSIS — R109 Unspecified abdominal pain: Secondary | ICD-10-CM | POA: Diagnosis present

## 2022-10-09 DIAGNOSIS — E039 Hypothyroidism, unspecified: Secondary | ICD-10-CM | POA: Diagnosis not present

## 2022-10-09 DIAGNOSIS — R197 Diarrhea, unspecified: Secondary | ICD-10-CM

## 2022-10-09 DIAGNOSIS — I10 Essential (primary) hypertension: Secondary | ICD-10-CM | POA: Diagnosis not present

## 2022-10-09 DIAGNOSIS — K529 Noninfective gastroenteritis and colitis, unspecified: Secondary | ICD-10-CM

## 2022-10-09 DIAGNOSIS — Z79899 Other long term (current) drug therapy: Secondary | ICD-10-CM | POA: Insufficient documentation

## 2022-10-09 DIAGNOSIS — R1032 Left lower quadrant pain: Secondary | ICD-10-CM

## 2022-10-09 LAB — CBC
HCT: 31.4 % — ABNORMAL LOW (ref 36.0–46.0)
Hemoglobin: 9.7 g/dL — ABNORMAL LOW (ref 12.0–15.0)
MCH: 22.8 pg — ABNORMAL LOW (ref 26.0–34.0)
MCHC: 30.9 g/dL (ref 30.0–36.0)
MCV: 73.9 fL — ABNORMAL LOW (ref 80.0–100.0)
Platelets: 428 10*3/uL — ABNORMAL HIGH (ref 150–400)
RBC: 4.25 MIL/uL (ref 3.87–5.11)
RDW: 16.5 % — ABNORMAL HIGH (ref 11.5–15.5)
WBC: 18.4 10*3/uL — ABNORMAL HIGH (ref 4.0–10.5)
nRBC: 0 % (ref 0.0–0.2)

## 2022-10-09 LAB — URINALYSIS, ROUTINE W REFLEX MICROSCOPIC
Bilirubin Urine: NEGATIVE
Glucose, UA: NEGATIVE mg/dL
Ketones, ur: NEGATIVE mg/dL
Leukocytes,Ua: NEGATIVE
Nitrite: NEGATIVE
Specific Gravity, Urine: 1.028 (ref 1.005–1.030)
pH: 5.5 (ref 5.0–8.0)

## 2022-10-09 LAB — COMPREHENSIVE METABOLIC PANEL
ALT: 11 U/L (ref 0–44)
AST: 11 U/L — ABNORMAL LOW (ref 15–41)
Albumin: 4.2 g/dL (ref 3.5–5.0)
Alkaline Phosphatase: 110 U/L (ref 38–126)
Anion gap: 11 (ref 5–15)
BUN: 18 mg/dL (ref 8–23)
CO2: 26 mmol/L (ref 22–32)
Calcium: 9.5 mg/dL (ref 8.9–10.3)
Chloride: 97 mmol/L — ABNORMAL LOW (ref 98–111)
Creatinine, Ser: 1.51 mg/dL — ABNORMAL HIGH (ref 0.44–1.00)
GFR, Estimated: 39 mL/min — ABNORMAL LOW (ref >60)
Glucose, Bld: 95 mg/dL (ref 70–99)
Potassium: 3.4 mmol/L — ABNORMAL LOW (ref 3.5–5.1)
Sodium: 134 mmol/L — ABNORMAL LOW (ref 135–145)
Total Bilirubin: 0.4 mg/dL (ref 0.3–1.2)
Total Protein: 6.7 g/dL (ref 6.5–8.1)

## 2022-10-09 LAB — LIPASE, BLOOD: Lipase: 10 U/L — ABNORMAL LOW (ref 11–51)

## 2022-10-09 MED ORDER — FENTANYL CITRATE PF 50 MCG/ML IJ SOSY
50.0000 ug | PREFILLED_SYRINGE | Freq: Once | INTRAMUSCULAR | Status: AC
  Administered 2022-10-09: 50 ug via INTRAVENOUS
  Filled 2022-10-09: qty 1

## 2022-10-09 MED ORDER — GUAIFENESIN 100 MG/5ML PO LIQD: 5.0000 mL | ORAL | 0 refills | Status: DC | PRN

## 2022-10-09 MED ORDER — SODIUM CHLORIDE 0.9 % IV BOLUS
1000.0000 mL | Freq: Once | INTRAVENOUS | Status: AC
  Administered 2022-10-09: 1000 mL via INTRAVENOUS

## 2022-10-09 NOTE — Discharge Instructions (Signed)
You were seen emergency department today for abdominal pain and diarrhea.  As we discussed your lab work was overall reassuring.  Your CT scan showed evidence of colitis in your descending colon.  I suspect this is likely caused by a viral stomach infection.  This is best treated with good oral hydration and bland diet/bowel rest.  I recommend sticking to clear to full liquids for the next 24-36 hours.   Continue to monitor how you're doing and return to the ER for new or worsening symptoms.

## 2022-10-09 NOTE — ED Provider Notes (Signed)
[facility]ERGENCY DEPARTMENT AT [facility] Provider Note   CSN: [id number] Arrival date & time: 10/09/22  1517     History  Chief Complaint  Patient presents with   Abdominal Pain    Denise Morales is a 62 y.o. female with a history of hypertension, GERD, depression, hyperlipidemia, hypothyroidism, fibromyalgia, and anxiety who presents to the emergency department complaining of abdominal pain.  Patient states that she was sent by her PCP with concern for dehydration and a possible near syncopal episode.  States that she started having nausea, vomiting, diarrhea, and abdominal cramping 3 days ago. Vomiting only happened on the first day, diarrhea resolved yesterday. She is also complaining of persistent cough, developed laryngitis after a bout of reflux last week, just got her voice back. Cough has returned and is nonproductive, very bothersome. Exacerbates her abdominal pain.    Abdominal Pain Associated symptoms: diarrhea, nausea and vomiting        Home Medications Prior to Admission medications   Medication Sig Start Date End Date Taking? Authorizing Provider  guaiFENesin (ROBITUSSIN) 100 MG/5ML liquid Take 5 mLs by mouth every 4 (four) hours as needed for cough or to loosen phlegm. 10/09/22  Yes Mahari Strahm T, PA-C  acetaminophen (TYLENOL) 500 MG tablet Take 500 mg by mouth every 6 (six) hours as needed for mild pain.    [provider]  allopurinol (ZYLOPRIM) 100 MG tablet Take 100 mg by mouth daily. 03/28/21   [provider]  busPIRone (BUSPAR) 10 MG tablet Take 10 mg by mouth 3 (three) times daily.    [provider]  celecoxib (CELEBREX) 200 MG capsule Take 1 capsule (200 mg total) by mouth 2 (two) times daily between meals as needed. 12/07/21   Kathryne Hitch, MD  cyclobenzaprine (FLEXERIL) 5 MG tablet Take 1 tablet by mouth 3 (three) times daily as needed.    [provider]  dicyclomine (BENTYL) 10 MG capsule  Take 10 mg by mouth every 6 (six) hours as needed for spasms. 06/25/21   [provider]  diphenhydrAMINE (BENADRYL) 25 mg capsule Take 25 mg by mouth daily.    [provider]  doxycycline (VIBRA-TABS) 100 MG tablet Take 1 tablet (100 mg total) by mouth 2 (two) times daily. 07/17/22   Candelaria Stagers, DPM  escitalopram (LEXAPRO) 20 MG tablet Take 1 tablet (20 mg total) by mouth daily. 12/01/20   Grayce Sessions, NP  ibuprofen (ADVIL) 800 MG tablet Take 1 tablet (800 mg total) by mouth every 6 (six) hours as needed. 06/05/22   Candelaria Stagers, DPM  Krill Oil 500 MG CAPS Take 1 capsule by mouth in the morning.    [provider]  levothyroxine (SYNTHROID) 50 MCG tablet Take 50 mcg by mouth daily. 03/23/21   [provider]  lisinopril-hydrochlorothiazide (ZESTORETIC) 10-12.5 MG tablet Take 1 tablet by mouth daily. 12/01/20   Grayce Sessions, NP  meloxicam (MOBIC) 15 MG tablet Take 1 tablet (15 mg total) by mouth daily. 07/11/22   Candelaria Stagers, DPM  naproxen (EC NAPROSYN) 500 MG EC tablet Take 500 mg by mouth 2 (two) times daily with a meal.    [provider]  oxyCODONE-acetaminophen (PERCOCET) 5-325 MG tablet Take 1 tablet by mouth every 4 (four) hours as needed for severe pain. 06/05/22   Candelaria Stagers, DPM  pantoprazole (PROTONIX) 40 MG tablet TAKE 1 TABLET BY MOUTH TWICE DAILY BEFORE A MEAL 07/27/22   Jenel Lucks,  MD  pravastatin (PRAVACHOL) 40 MG tablet Take 1 tablet (40 mg total) by mouth daily. 12/01/20   Grayce Sessions, NP  QUEtiapine (SEROQUEL) 50 MG tablet Take 50 mg by mouth at bedtime.    [provider]  sucralfate (CARAFATE) 1 g tablet TAKE 1 TABLET BY MOUTH THREE TIMES DAILY AS NEEDED FOR HEART BURN 08/24/22   Doree Albee, PA-C  traMADol (ULTRAM) 50 MG tablet Take 1 tablet (50 mg total) by mouth every 8 (eight) hours as needed. 05/12/22   Juanda Chance, NP  Turmeric 500 MG CAPS Take 500 mg by mouth daily.     [provider]  Vitamin D-Vitamin K (VITAMIN K2-VITAMIN D3 PO) Take 1 tablet by mouth daily.    [provider]  zolpidem (AMBIEN) 5 MG tablet Take 5 mg by mouth at bedtime as needed. 11/17/21   [provider]      Allergies    Hydroxyzine    Review of Systems   Review of Systems  Gastrointestinal:  Positive for abdominal pain, diarrhea, nausea and vomiting.  Neurological:  Positive for light-headedness.  All other systems reviewed and are negative.   Physical Exam Updated Vital Signs BP 103/66   Pulse 98   Temp 98.7 F (37.1 C) (Oral)   Resp 18   SpO2 96%  Physical Exam Vitals and nursing note reviewed.  Constitutional:      Appearance: Normal appearance.  HENT:     Head: Normocephalic and atraumatic.  Eyes:     Conjunctiva/sclera: Conjunctivae normal.  Cardiovascular:     Rate and Rhythm: Normal rate and regular rhythm.  Pulmonary:     Effort: Pulmonary effort is normal. No respiratory distress.     Breath sounds: Normal breath sounds.  Abdominal:     General: There is no distension.     Palpations: Abdomen is soft.     Tenderness: There is abdominal tenderness in the left lower quadrant. There is guarding. There is no right CVA tenderness, left CVA tenderness or rebound.  Skin:    General: Skin is warm and dry.  Neurological:     General: No focal deficit present.     Mental Status: She is alert.     ED Results / Procedures / Treatments   Labs (all labs ordered are listed, but only abnormal results are displayed) Labs Reviewed  LIPASE, BLOOD - Abnormal; Notable for the following components:      Result Value   Lipase 10 (*)    All other components within normal limits  COMPREHENSIVE METABOLIC PANEL - Abnormal; Notable for the following components:   Sodium 134 (*)    Potassium 3.4 (*)    Chloride 97 (*)    Creatinine, Ser 1.51 (*)    AST 11 (*)    GFR, Estimated 39 (*)    All other components within normal limits  CBC -  Abnormal; Notable for the following components:   WBC 18.4 (*)    Hemoglobin 9.7 (*)    HCT 31.4 (*)    MCV 73.9 (*)    MCH 22.8 (*)    RDW 16.5 (*)    Platelets 428 (*)    All other components within normal limits  URINALYSIS, ROUTINE W REFLEX MICROSCOPIC - Abnormal; Notable for the following components:   Hgb urine dipstick MODERATE (*)    Protein, ur TRACE (*)    Bacteria, UA RARE (*)    All other components within normal limits  EKG EKG Interpretation  Date/Time:  Monday October 09 2022 16:37:24 EDT Ventricular Rate:  89 PR Interval:  169 QRS Duration: 90 QT Interval:  361 QTC Calculation: 440 R Axis:   55 Text Interpretation: Sinus rhythm Low voltage, precordial leads Abnormal R-wave progression, early transition Borderline T abnormalities, anterior leads Confirmed by Tanda Rockers ([id number]) on 10/09/2022 4:39:37 PM  Radiology CT ABDOMEN PELVIS WO CONTRAST  Result Date: 10/09/2022 CLINICAL DATA:  Abdominal pain and dehydration with possible syncope. EXAM: CT ABDOMEN AND PELVIS WITHOUT CONTRAST TECHNIQUE: Multidetector CT imaging of the abdomen and pelvis was performed following the standard protocol without IV contrast. RADIATION DOSE REDUCTION: This exam was performed according to the departmental dose-optimization program which includes automated exposure control, adjustment of the mA and/or kV according to patient size and/or use of iterative reconstruction technique. COMPARISON:  None Available. FINDINGS: Lower chest: No acute abnormality. Hepatobiliary: No focal liver abnormality is seen. Status post cholecystectomy. No biliary dilatation. Pancreas: Unremarkable. No pancreatic ductal dilatation or surrounding inflammatory changes. Spleen: Normal in size without focal abnormality. Adrenals/Urinary Tract: Adrenal glands are unremarkable. Kidneys are normal in size, without obstructing renal calculi, focal lesion, or hydronephrosis. 2 mm nonobstructing renal calculi are seen within  the right kidney. Bladder is unremarkable. Stomach/Bowel: There is a moderate-sized hiatal hernia. Appendix appears normal. No evidence of bowel dilatation. Diverticula are seen throughout the sigmoid colon. Moderately thickened and inflamed descending colon is seen. Vascular/Lymphatic: No significant vascular findings are present. No enlarged abdominal or pelvic lymph nodes. Reproductive: Status post hysterectomy. No adnexal masses. Other: No abdominal wall hernia or abnormality. No abdominopelvic ascites. Musculoskeletal: No acute or significant osseous findings. IMPRESSION: 1. Moderate severity colitis involving the descending colon. 2. Sigmoid diverticulosis. 3. Moderate-sized hiatal hernia. 4. 2 mm nonobstructing right renal calculi. 5. Evidence of prior cholecystectomy and hysterectomy. Electronically Signed   By: Aram Candela M.D.   On: 10/09/2022 17:47    Procedures Procedures    Medications Ordered in ED Medications  sodium chloride 0.9 % bolus 1,000 mL (0 mLs Intravenous Stopped 10/09/22 1952)  fentaNYL (SUBLIMAZE) injection 50 mcg (50 mcg Intravenous Given 10/09/22 1846)    ED Course/ Medical Decision Making/ A&P                             Medical Decision Making Amount and/or Complexity of Data Reviewed Labs: ordered. Radiology: ordered.  Risk OTC drugs. Prescription drug management.   This patient is a 62 y.o. female  who presents to the ED for concern of nausea, vomiting, diarrhea with near syncopal episode.   Differential diagnoses prior to evaluation: The emergent differential diagnosis includes, but is not limited to,  Infectious causes such as: Viral, Bacterial, Parasitic; Toxin. Noninfectious causes such as: GI Bleed, Appendicitis, Mesenteric Ischemia, Diverticulitis, endocrine causes (adrenal, thyroid), Toxicologic exposures, Drug-associated, IBD. This is not an exhaustive differential.   Past Medical History / Co-morbidities: hypertension, GERD, depression,  hyperlipidemia, hypothyroidism, fibromyalgia, and anxiety  Additional history: Chart reviewed. Pertinent results include: Cannot view PCP records from today in Care Everywhere  Physical Exam: Physical exam performed. The pertinent findings include: Normal vital signs, in no acute distress. Abdomen soft, LLQ tenderness to palpation with guarding.   Lab Tests/Imaging studies: I personally interpreted labs/imaging and the pertinent results include: Leukocytosis of 18.4.  Hemoglobin stable compared to prior.  CMP grossly unremarkable, mildly elevated creatinine to 1.51 compared to baseline.  Urinalysis with moderate hemoglobin and trace protein.  CT abdomen pelvis with moderate colitis of the descending colon, no abscess or perforation noted. I agree with the radiologist interpretation.  Cardiac monitoring: EKG obtained and interpreted by my attending physician which shows: sinus rhythm with borderline T abnormalities   Medications: I ordered medication including IVF + analgesics.  I have reviewed the patients home medicines and have made adjustments as needed.   Disposition: After consideration of the diagnostic results and the patients response to treatment, I feel that emergency department workup does not suggest an emergent condition requiring admission or immediate intervention beyond what has been performed at this time. The plan is: Discharged home with encouragement for good p.o. hydration, bland diet for bowel rest, and prescription cough medicine for subacute cough.  Suspect pain related to colitis, likely of viral origin.  No fever or bloody diarrhea, and patient overall clinically appearing well, so lower suspicion for bacterial infection.  No recent antibiotic use or recent travel. The patient is safe for discharge and has been instructed to return immediately for worsening symptoms, change in symptoms or any other concerns.  Final Clinical Impression(s) / ED Diagnoses Final  diagnoses:  LLQ abdominal pain  Diarrhea, unspecified type  Colitis  Subacute cough    Rx / DC Orders ED Discharge Orders          Ordered    guaiFENesin (ROBITUSSIN) 100 MG/5ML liquid  Every 4 hours PRN        10/09/22 2025           Portions of this report may have been transcribed using voice recognition software. Every effort was made to ensure accuracy; however, inadvertent computerized transcription errors may be present.    Jayshun Galentine T, PA-C 10/09/22 2029    Tanda Rockers A, DO 10/12/22 1535

## 2022-10-09 NOTE — ED Triage Notes (Signed)
See by PCP. Sent for eval re: dehydrated, possible near syncopal episode. Saturday started n/v/d and abdo cramping

## 2022-10-10 ENCOUNTER — Encounter: Payer: Self-pay | Admitting: Orthopedic Surgery

## 2022-10-23 ENCOUNTER — Encounter: Payer: Self-pay | Admitting: Family Medicine

## 2022-10-23 ENCOUNTER — Ambulatory Visit: Payer: Medicaid Other | Admitting: Family Medicine

## 2022-10-23 VITALS — BP 128/92 | HR 86 | Temp 97.7°F | Ht <= 58 in | Wt 167.0 lb

## 2022-10-23 DIAGNOSIS — E782 Mixed hyperlipidemia: Secondary | ICD-10-CM

## 2022-10-23 DIAGNOSIS — D649 Anemia, unspecified: Secondary | ICD-10-CM

## 2022-10-23 DIAGNOSIS — R7303 Prediabetes: Secondary | ICD-10-CM

## 2022-10-23 DIAGNOSIS — E039 Hypothyroidism, unspecified: Secondary | ICD-10-CM | POA: Diagnosis not present

## 2022-10-23 DIAGNOSIS — Z114 Encounter for screening for human immunodeficiency virus [HIV]: Secondary | ICD-10-CM

## 2022-10-23 DIAGNOSIS — I10 Essential (primary) hypertension: Secondary | ICD-10-CM | POA: Diagnosis not present

## 2022-10-23 DIAGNOSIS — Z1159 Encounter for screening for other viral diseases: Secondary | ICD-10-CM

## 2022-10-23 DIAGNOSIS — Z1231 Encounter for screening mammogram for malignant neoplasm of breast: Secondary | ICD-10-CM

## 2022-10-23 DIAGNOSIS — Z760 Encounter for issue of repeat prescription: Secondary | ICD-10-CM

## 2022-10-23 MED ORDER — ESCITALOPRAM OXALATE 20 MG PO TABS: 20.0000 mg | ORAL_TABLET | Freq: Every day | ORAL | 1 refills | Status: DC

## 2022-10-23 NOTE — Assessment & Plan Note (Signed)
Fasting labs today

## 2022-10-23 NOTE — Assessment & Plan Note (Signed)
128/92 today. Patient reports <130/80 at home. Continue Lisinopril-HCTZ 10-12.5mg  daily. Will recheck in 1 week at physical. Encouraged heart healthy diet and 150 minutes moderate intensity exercise weekly. Seek medical care for chest pain, shortness of breath, palpitations, vision changes, recurrent headache, or swelling of extremities.

## 2022-10-23 NOTE — Patient Instructions (Signed)
It was great to meet you today and I'm excited to have you join the [facility] practice. I hope you had a positive experience today! If you feel so inclined, please feel free to recommend our practice to friends and family. Serenity Batley, FNP-C  

## 2022-10-23 NOTE — Progress Notes (Signed)
New Patient Office Visit  Subjective    Patient ID: Denise Morales, female    DOB: 09/03/1960  Age: 62 y.o. MRN: [id number]  CC:  Chief Complaint  Patient presents with   Follow-up     new pt estab care; ppwrk received 10/16/22. Placed in binder at front desk    HPI Domino Holten presents to establish care. Oriented to practice routines and expectations. PMH includes HTN, GERD, depression, HLD, hypothyroidism, fibromyalgia, and anxiety. She did have a recent urgent care visit for acute diarrhea and dehydration. BP at home remains <130/80 and she checks sporadically.  Breast CA screening: Mammogram status: Completed 2022. Repeat every year Cervical CA screening:  hysterectomy Colon CA screening: colonoscopy 2 years ago  with benign polyps  abnormalities. Repeat 7y. Tobacco: non-smoker Vaccines:  declines shingles and tetanus     Outpatient Encounter Medications as of 10/23/2022  Medication Sig   acetaminophen (TYLENOL) 500 MG tablet Take 500 mg by mouth every 6 (six) hours as needed for mild pain.   allopurinol (ZYLOPRIM) 100 MG tablet Take 100 mg by mouth daily.   busPIRone (BUSPAR) 10 MG tablet Take 10 mg by mouth 3 (three) times daily.   celecoxib (CELEBREX) 200 MG capsule Take 1 capsule (200 mg total) by mouth 2 (two) times daily between meals as needed.   cyclobenzaprine (FLEXERIL) 5 MG tablet Take 1 tablet by mouth 3 (three) times daily as needed.   dicyclomine (BENTYL) 10 MG capsule Take 10 mg by mouth every 6 (six) hours as needed for spasms.   diphenhydrAMINE (BENADRYL) 25 mg capsule Take 25 mg by mouth daily.   Krill Oil 500 MG CAPS Take 1 capsule by mouth in the morning.   levothyroxine (SYNTHROID) 50 MCG tablet Take 50 mcg by mouth daily.   lisinopril-hydrochlorothiazide (ZESTORETIC) 10-12.5 MG tablet Take 1 tablet by mouth daily.   naproxen (EC NAPROSYN) 500 MG EC tablet Take 500 mg by mouth 2 (two) times daily with a meal.   pantoprazole (PROTONIX) 40 MG tablet  TAKE 1 TABLET BY MOUTH TWICE DAILY BEFORE A MEAL   pravastatin (PRAVACHOL) 40 MG tablet Take 1 tablet (40 mg total) by mouth daily.   QUEtiapine (SEROQUEL) 50 MG tablet Take 50 mg by mouth at bedtime.   sucralfate (CARAFATE) 1 g tablet TAKE 1 TABLET BY MOUTH THREE TIMES DAILY AS NEEDED FOR HEART BURN   Turmeric 500 MG CAPS Take 500 mg by mouth daily.   Vitamin D-Vitamin K (VITAMIN K2-VITAMIN D3 PO) Take 1 tablet by mouth daily.   zolpidem (AMBIEN) 5 MG tablet Take 5 mg by mouth at bedtime as needed.   [DISCONTINUED] doxycycline (VIBRA-TABS) 100 MG tablet Take 1 tablet (100 mg total) by mouth 2 (two) times daily.   [DISCONTINUED] escitalopram (LEXAPRO) 20 MG tablet Take 1 tablet (20 mg total) by mouth daily.   [DISCONTINUED] meloxicam (MOBIC) 15 MG tablet Take 1 tablet (15 mg total) by mouth daily.   escitalopram (LEXAPRO) 20 MG tablet Take 1 tablet (20 mg total) by mouth daily.   [DISCONTINUED] guaiFENesin (ROBITUSSIN) 100 MG/5ML liquid Take 5 mLs by mouth every 4 (four) hours as needed for cough or to loosen phlegm. (Patient not taking: Reported on 10/23/2022)   [DISCONTINUED] ibuprofen (ADVIL) 800 MG tablet Take 1 tablet (800 mg total) by mouth every 6 (six) hours as needed. (Patient not taking: Reported on 10/23/2022)   [DISCONTINUED] oxyCODONE-acetaminophen (PERCOCET) 5-325 MG tablet Take 1 tablet by mouth every 4 (four) hours as needed  for severe pain. (Patient not taking: Reported on 10/23/2022)   [DISCONTINUED] traMADol (ULTRAM) 50 MG tablet Take 1 tablet (50 mg total) by mouth every 8 (eight) hours as needed. (Patient not taking: Reported on 10/23/2022)   No facility-administered encounter medications on file as of 10/23/2022.    Past Medical History:  Diagnosis Date   Allergy    Anxiety    Arthritis    Depression    Fibromyalgia    GERD (gastroesophageal reflux disease)    Hyperlipidemia    Hypertension    Hypothyroidism    Melanoma (HCC)    face   Thyroid disease     Past  Surgical History:  Procedure Laterality Date   ABDOMINAL HYSTERECTOMY  1985   AIKEN OSTEOTOMY Right 06/05/2022   Procedure: Quintella Reichert OSTEOTOMY;  Surgeon: Candelaria Stagers, DPM;  Location: [facility];  Service: Podiatry;  Laterality: Right;   CAPSULOTOMY METATARSOPHALANGEAL Right 06/05/2022   Procedure: CAPSULOTOMY METATARSOPHALANGEAL;  Surgeon: Candelaria Stagers, DPM;  Location: [facility];  Service: Podiatry;  Laterality: Right;   CHOLECYSTECTOMY     COLONOSCOPY     HALLUX VALGUS LAPIDUS Right 06/05/2022   Procedure: HALLUX VALGUS LAPIDUS;  Surgeon: Candelaria Stagers, DPM;  Location: [facility];  Service: Podiatry;  Laterality: Right;   HAMMER TOE SURGERY Right 06/05/2022   Procedure: HAMMER TOE CORRECTION;  Surgeon: Candelaria Stagers, DPM;  Location: [facility];  Service: Podiatry;  Laterality: Right;   SHOULDER ARTHROSCOPY WITH ROTATOR CUFF REPAIR AND SUBACROMIAL DECOMPRESSION Right 08/12/2020   Procedure: RIGHT SHOULDER ARTHROSCOPY WITH ROTATOR CUFF REPAIR AND SUBACROMIAL DECOMPRESSION;  Surgeon: Kathryne Hitch, MD;  Location: [facility];  Service: Orthopedics;  Laterality: Right;   TOTAL KNEE ARTHROPLASTY Right 12/24/2020   Procedure: RIGHT TOTAL KNEE ARTHROPLASTY;  Surgeon: Kathryne Hitch, MD;  Location: [facility];  Service: Orthopedics;  Laterality: Right;   TOTAL KNEE ARTHROPLASTY Left 07/15/2021   Procedure: LEFT TOTAL KNEE ARTHROPLASTY;  Surgeon: Kathryne Hitch, MD;  Location: [facility];  Service: Orthopedics;  Laterality: Left;    Family History  Problem Relation Age of Onset   Dementia Mother    Heart Problems Mother    Stroke Mother    Hypertension Mother    Hypertension Father    Colon polyps Father    Diabetes Father    Hypertension Sister    Diabetes Sister        borderline   Fibromyalgia Sister    Hypertension Sister    Colon polyps Sister    Diabetes Sister    Fibromyalgia  Sister    Heart Problems Sister    Arthritis Sister    Hypertension Sister    Heart Problems Sister    Diabetes Paternal Aunt    Healthy Daughter    Colon cancer Neg Hx    Esophageal cancer Neg Hx    Rectal cancer Neg Hx    Stomach cancer Neg Hx     Social History   Socioeconomic History   Marital status: Married    Spouse name: Not on file   Number of children: Not on file   Years of education: Not on file   Highest education level: Not on file  Occupational History   Not on file  Tobacco Use   Smoking status: Never   Smokeless tobacco: Never  Vaping Use   Vaping Use: Never used  Substance and Sexual Activity   Alcohol use: Not Currently  Drug use: Never   Sexual activity: Not Currently  Other Topics Concern   Not on file  Social History Narrative   Not on file   Social Determinants of Health   Financial Resource Strain: Not on file  Food Insecurity: Not on file  Transportation Needs: Not on file  Physical Activity: Not on file  Stress: Not on file  Social Connections: Not on file  Intimate Partner Violence: Not on file    Review of Systems  All other systems reviewed and are negative.       Objective    BP (!) 128/92   Pulse 86   Temp 97.7 F (36.5 C) (Oral)   Ht 4\' 10"  (1.473 m)   Wt 167 lb (75.8 kg)   SpO2 98%   BMI 34.90 kg/m   Physical Exam Vitals and nursing note reviewed.  Constitutional:      Appearance: Normal appearance. She is normal weight.  HENT:     Head: Normocephalic and atraumatic.  Cardiovascular:     Rate and Rhythm: Normal rate and regular rhythm.     Pulses: Normal pulses.     Heart sounds: Murmur heard.  Pulmonary:     Effort: Pulmonary effort is normal.     Breath sounds: Normal breath sounds.  Skin:    General: Skin is warm and dry.  Neurological:     General: No focal deficit present.     Mental Status: She is alert and oriented to person, place, and time. Mental status is at baseline.  Psychiatric:         Mood and Affect: Mood normal.        Behavior: Behavior normal.        Thought Content: Thought content normal.        Judgment: Judgment normal.         Assessment & Plan:   Problem List Items Addressed This Visit     Hyperlipidemia    Fasting labs today.      Relevant Orders   Lipid panel   Essential hypertension - Primary    128/92 today. Patient reports <130/80 at home. Continue Lisinopril-HCTZ 10-12.5mg  daily. Will recheck in 1 week at physical. Encouraged heart healthy diet and 150 minutes moderate intensity exercise weekly. Seek medical care for chest pain, shortness of breath, palpitations, vision changes, recurrent headache, or swelling of extremities.      Relevant Orders   CBC with Differential/Platelet   COMPLETE METABOLIC PANEL WITH GFR   Lipid panel   Prediabetes    A1c today.      Relevant Orders   Hemoglobin A1c   Hypothyroidism    Currently taking Synthroid daily. TSH today.      Relevant Orders   TSH   Other Visit Diagnoses     Need for hepatitis C screening test       Relevant Orders   Hepatitis C antibody   Screening for HIV (human immunodeficiency virus)       Relevant Orders   HIV Antibody (routine testing w rflx)   Encounter for screening mammogram for malignant neoplasm of breast       Relevant Orders   MM DIGITAL SCREENING BILATERAL   Medication refill       Relevant Medications   escitalopram (LEXAPRO) 20 MG tablet       Return in about 1 week (around 10/30/2022) for annual physical.   Park Meo, FNP

## 2022-10-23 NOTE — Assessment & Plan Note (Signed)
A1c today.  

## 2022-10-23 NOTE — Assessment & Plan Note (Signed)
Currently taking Synthroid daily. TSH today.

## 2022-10-24 ENCOUNTER — Other Ambulatory Visit (HOSPITAL_BASED_OUTPATIENT_CLINIC_OR_DEPARTMENT_OTHER): Payer: Self-pay

## 2022-10-24 ENCOUNTER — Other Ambulatory Visit: Payer: Self-pay

## 2022-10-24 DIAGNOSIS — R5383 Other fatigue: Secondary | ICD-10-CM

## 2022-10-24 DIAGNOSIS — D649 Anemia, unspecified: Secondary | ICD-10-CM

## 2022-10-24 LAB — COMPLETE METABOLIC PANEL WITH GFR
AG Ratio: 1.8 (calc) (ref 1.0–2.5)
ALT: 9 U/L (ref 6–29)
AST: 12 U/L (ref 10–35)
Albumin: 4 g/dL (ref 3.6–5.1)
Alkaline phosphatase (APISO): 94 U/L (ref 37–153)
BUN: 10 mg/dL (ref 7–25)
CO2: 27 mmol/L (ref 20–32)
Calcium: 9 mg/dL (ref 8.6–10.4)
Chloride: 98 mmol/L (ref 98–110)
Creat: 1 mg/dL (ref 0.50–1.05)
Globulin: 2.2 g/dL (calc) (ref 1.9–3.7)
Glucose, Bld: 94 mg/dL (ref 65–99)
Potassium: 3.5 mmol/L (ref 3.5–5.3)
Sodium: 135 mmol/L (ref 135–146)
Total Bilirubin: 0.3 mg/dL (ref 0.2–1.2)
Total Protein: 6.2 g/dL (ref 6.1–8.1)
eGFR: 63 mL/min/{1.73_m2} (ref >=60)

## 2022-10-24 LAB — CBC WITH DIFFERENTIAL/PLATELET
Absolute Monocytes: 622 cells/uL (ref 200–950)
Basophils Absolute: 111 cells/uL (ref 0–200)
Basophils Relative: 1 %
Eosinophils Absolute: 322 cells/uL (ref 15–500)
Eosinophils Relative: 2.9 %
HCT: 28.5 % — ABNORMAL LOW (ref 35.0–45.0)
Hemoglobin: 8.6 g/dL — ABNORMAL LOW (ref 11.7–15.5)
Lymphs Abs: 2997 cells/uL (ref 850–3900)
MCH: 22.2 pg — ABNORMAL LOW (ref 27.0–33.0)
MCHC: 30.2 g/dL — ABNORMAL LOW (ref 32.0–36.0)
MCV: 73.6 fL — ABNORMAL LOW (ref 80.0–100.0)
MPV: 9.3 fL (ref 7.5–12.5)
Monocytes Relative: 5.6 %
Neutro Abs: 7049 cells/uL (ref 1500–7800)
Neutrophils Relative %: 63.5 %
Platelets: 482 10*3/uL — ABNORMAL HIGH (ref 140–400)
RBC: 3.87 10*6/uL (ref 3.80–5.10)
RDW: 15.7 % — ABNORMAL HIGH (ref 11.0–15.0)
Total Lymphocyte: 27 %
WBC: 11.1 10*3/uL — ABNORMAL HIGH (ref 3.8–10.8)

## 2022-10-24 LAB — LIPID PANEL
Cholesterol: 158 mg/dL (ref <200)
HDL: 63 mg/dL (ref >=50)
LDL Cholesterol (Calc): 74 mg/dL (calc)
Non-HDL Cholesterol (Calc): 95 mg/dL (calc) (ref <130)
Total CHOL/HDL Ratio: 2.5 (calc) (ref <5.0)
Triglycerides: 133 mg/dL (ref <150)

## 2022-10-24 LAB — HEPATITIS C ANTIBODY: Hepatitis C Ab: NONREACTIVE

## 2022-10-24 LAB — IRON,TIBC AND FERRITIN PANEL
%SAT: 4 % (calc) — ABNORMAL LOW (ref 16–45)
Ferritin: 6 ng/mL — ABNORMAL LOW (ref 16–288)
Iron: 17 ug/dL — ABNORMAL LOW (ref 45–160)
TIBC: 479 mcg/dL (calc) — ABNORMAL HIGH (ref 250–450)

## 2022-10-24 LAB — TSH: TSH: 1.25 mIU/L (ref 0.40–4.50)

## 2022-10-24 LAB — HEMOGLOBIN A1C
Hgb A1c MFr Bld: 6.2 % of total Hgb — ABNORMAL HIGH (ref <5.7)
Mean Plasma Glucose: 131 mg/dL
eAG (mmol/L): 7.3 mmol/L

## 2022-10-24 LAB — B12 AND FOLATE PANEL
Folate: 7 ng/mL
Vitamin B-12: 396 pg/mL (ref 200–1100)

## 2022-10-24 LAB — RETICULOCYTES
ABS Retic: 80850 cells/uL — ABNORMAL HIGH (ref 20000–80000)
Retic Ct Pct: 2.1 %

## 2022-10-24 LAB — TEST AUTHORIZATION

## 2022-10-24 LAB — HIV ANTIBODY (ROUTINE TESTING W REFLEX): HIV 1&2 Ab, 4th Generation: NONREACTIVE

## 2022-10-24 NOTE — Addendum Note (Signed)
Addended by: Park Meo on: 10/24/2022 08:03 AM   Modules accepted: Orders

## 2022-10-26 ENCOUNTER — Other Ambulatory Visit: Payer: Self-pay | Admitting: Family Medicine

## 2022-10-26 DIAGNOSIS — D509 Iron deficiency anemia, unspecified: Secondary | ICD-10-CM

## 2022-10-26 DIAGNOSIS — Z1211 Encounter for screening for malignant neoplasm of colon: Secondary | ICD-10-CM

## 2022-10-26 MED ORDER — IRON (FERROUS SULFATE) 325 (65 FE) MG PO TABS: 325.0000 mg | ORAL_TABLET | Freq: Every day | ORAL | 1 refills | Status: DC

## 2022-10-30 ENCOUNTER — Encounter: Payer: Self-pay | Admitting: Family Medicine

## 2022-10-30 ENCOUNTER — Ambulatory Visit: Payer: Medicaid Other | Admitting: Family Medicine

## 2022-10-30 VITALS — BP 100/76 | HR 87 | Temp 97.7°F | Ht <= 58 in | Wt 166.0 lb

## 2022-10-30 DIAGNOSIS — Z79899 Other long term (current) drug therapy: Secondary | ICD-10-CM | POA: Diagnosis not present

## 2022-10-30 DIAGNOSIS — Z0001 Encounter for general adult medical examination with abnormal findings: Secondary | ICD-10-CM

## 2022-10-30 DIAGNOSIS — D509 Iron deficiency anemia, unspecified: Secondary | ICD-10-CM | POA: Diagnosis not present

## 2022-10-30 DIAGNOSIS — Z0000 Encounter for general adult medical examination without abnormal findings: Secondary | ICD-10-CM | POA: Insufficient documentation

## 2022-10-30 NOTE — Progress Notes (Addendum)
Complete physical exam  Patient: Denise Morales   DOB: 17-Aug-1960   62 y.o. Female  MRN: [id number]  Subjective:    Chief Complaint  Patient presents with   Annual Exam    Denise Morales is a 62 y.o. female who presents today for a complete physical exam. She reports consuming a general diet. The patient does not participate in regular exercise at present. She generally feels fairly well. She reports sleeping well. She does have additional problems to discuss today including medication management, she would like to discuss her current medication regimen and reduce what she is taking. She has started taking Ferrous Sulfate daily since her labs last week and has an appointment with GI this week to discuss her reflux and need for colonoscopy as recommended due to her worsening anemia. She denies blood in stool or dark tarry stools, no bleeding, bruising easily, lightheadedness. She does endorse fatigue. Home BP readings have been <130/80.   Most recent fall risk assessment:    10/23/2022    3:16 PM  Fall Risk   Falls in the past year? 1  Number falls in past yr: 0  Injury with Fall? 0  Risk for fall due to : No Fall Risks  Follow up Falls evaluation completed;Education provided     Most recent depression screenings:    10/23/2022    3:17 PM 12/01/2020    2:49 PM  PHQ 2/9 Scores  PHQ - 2 Score 2 1  PHQ- 9 Score 6     Vision:Within last year and Dental: No current dental problems  Past Medical History:  Diagnosis Date   Allergy    Anxiety    Arthritis    Depression    Fibromyalgia    GERD (gastroesophageal reflux disease)    Hyperlipidemia    Hypertension    Hypothyroidism    Melanoma (HCC)    face   Thyroid disease    Past Surgical History:  Procedure Laterality Date   ABDOMINAL HYSTERECTOMY  1985   AIKEN OSTEOTOMY Right 06/05/2022   Procedure: Quintella Reichert OSTEOTOMY;  Surgeon: Candelaria Stagers, DPM;  Location: [facility];  Service: Podiatry;  Laterality:  Right;   CAPSULOTOMY METATARSOPHALANGEAL Right 06/05/2022   Procedure: CAPSULOTOMY METATARSOPHALANGEAL;  Surgeon: Candelaria Stagers, DPM;  Location: [facility] [place];  Service: Podiatry;  Laterality: Right;   CHOLECYSTECTOMY     COLONOSCOPY     HALLUX VALGUS LAPIDUS Right 06/05/2022   Procedure: HALLUX VALGUS LAPIDUS;  Surgeon: Candelaria Stagers, DPM;  Location: [facility];  Service: Podiatry;  Laterality: Right;   HAMMER TOE SURGERY Right 06/05/2022   Procedure: HAMMER TOE CORRECTION;  Surgeon: Candelaria Stagers, DPM;  Location: [facility];  Service: Podiatry;  Laterality: Right;   SHOULDER ARTHROSCOPY WITH ROTATOR CUFF REPAIR AND SUBACROMIAL DECOMPRESSION Right 08/12/2020   Procedure: RIGHT SHOULDER ARTHROSCOPY WITH ROTATOR CUFF REPAIR AND SUBACROMIAL DECOMPRESSION;  Surgeon: Kathryne Hitch, MD;  Location: [facility];  Service: Orthopedics;  Laterality: Right;   TOTAL KNEE ARTHROPLASTY Right 12/24/2020   Procedure: RIGHT TOTAL KNEE ARTHROPLASTY;  Surgeon: Kathryne Hitch, MD;  Location: [facility];  Service: Orthopedics;  Laterality: Right;   TOTAL KNEE ARTHROPLASTY Left 07/15/2021   Procedure: LEFT TOTAL KNEE ARTHROPLASTY;  Surgeon: Kathryne Hitch, MD;  Location: [facility];  Service: Orthopedics;  Laterality: Left;   Social History   Socioeconomic History   Marital status: Married    Spouse name: Not on  file   Number of children: Not on file   Years of education: Not on file   Highest education level: Not on file  Occupational History   Not on file  Tobacco Use   Smoking status: Never   Smokeless tobacco: Never  Vaping Use   Vaping Use: Never used  Substance and Sexual Activity   Alcohol use: Not Currently   Drug use: Never   Sexual activity: Not Currently  Other Topics Concern   Not on file  Social History Narrative   Not on file   Social Determinants of Health   Financial Resource Strain: Not on file   Food Insecurity: Not on file  Transportation Needs: Not on file  Physical Activity: Not on file  Stress: Not on file  Social Connections: Not on file  Intimate Partner Violence: Not on file   Family History  Problem Relation Age of Onset   Dementia Mother    Heart Problems Mother    Stroke Mother    Hypertension Mother    Hypertension Father    Colon polyps Father    Diabetes Father    Hypertension Sister    Diabetes Sister        borderline   Fibromyalgia Sister    Hypertension Sister    Colon polyps Sister    Diabetes Sister    Fibromyalgia Sister    Heart Problems Sister    Arthritis Sister    Hypertension Sister    Heart Problems Sister    Diabetes Paternal Aunt    Healthy Daughter    Colon cancer Neg Hx    Esophageal cancer Neg Hx    Rectal cancer Neg Hx    Stomach cancer Neg Hx    Allergies  Allergen Reactions   Hydroxyzine     Other reaction(s): Hallucinations      Patient Care Team: Park Meo, FNP as PCP - General (Family Medicine)   Outpatient Medications Prior to Visit  Medication Sig   acetaminophen (TYLENOL) 500 MG tablet Take 500 mg by mouth every 6 (six) hours as needed for mild pain.   busPIRone (BUSPAR) 10 MG tablet Take 10 mg by mouth 3 (three) times daily.   celecoxib (CELEBREX) 200 MG capsule Take 1 capsule (200 mg total) by mouth 2 (two) times daily between meals as needed.   cyclobenzaprine (FLEXERIL) 5 MG tablet Take 1 tablet by mouth 3 (three) times daily as needed.   dicyclomine (BENTYL) 10 MG capsule Take 10 mg by mouth every 6 (six) hours as needed for spasms.   diphenhydrAMINE (BENADRYL) 25 mg capsule Take 25 mg by mouth daily.   escitalopram (LEXAPRO) 20 MG tablet Take 1 tablet (20 mg total) by mouth daily.   Iron, Ferrous Sulfate, 325 (65 Fe) MG TABS Take 325 mg by mouth daily.   Krill Oil 500 MG CAPS Take 1 capsule by mouth in the morning.   levothyroxine (SYNTHROID) 50 MCG tablet Take 50 mcg by mouth daily.    lisinopril-hydrochlorothiazide (ZESTORETIC) 10-12.5 MG tablet Take 1 tablet by mouth daily.   pantoprazole (PROTONIX) 40 MG tablet TAKE 1 TABLET BY MOUTH TWICE DAILY BEFORE A MEAL   pravastatin (PRAVACHOL) 40 MG tablet Take 1 tablet (40 mg total) by mouth daily.   QUEtiapine (SEROQUEL) 50 MG tablet Take 50 mg by mouth at bedtime.   sucralfate (CARAFATE) 1 g tablet TAKE 1 TABLET BY MOUTH THREE TIMES DAILY AS NEEDED FOR HEART BURN   Turmeric 500 MG CAPS Take  500 mg by mouth daily.   Vitamin D-Vitamin K (VITAMIN K2-VITAMIN D3 PO) Take 1 tablet by mouth daily.   zolpidem (AMBIEN) 5 MG tablet Take 5 mg by mouth at bedtime as needed.   [DISCONTINUED] allopurinol (ZYLOPRIM) 100 MG tablet Take 100 mg by mouth daily.   [DISCONTINUED] naproxen (EC NAPROSYN) 500 MG EC tablet Take 500 mg by mouth 2 (two) times daily with a meal.   No facility-administered medications prior to visit.    Review of Systems  Constitutional:  Positive for malaise/fatigue.  HENT: Negative.    Eyes: Negative.   Respiratory: Negative.    Cardiovascular: Negative.   Gastrointestinal: Negative.  Negative for abdominal pain, blood in stool and melena.  Genitourinary: Negative.  Negative for hematuria.  Musculoskeletal:  Positive for joint pain.  Skin: Negative.   Neurological: Negative.  Negative for dizziness.  Endo/Heme/Allergies: Negative.  Does not bruise/bleed easily.  Psychiatric/Behavioral: Negative.    All other systems reviewed and are negative.         Objective:     BP 100/76   Pulse 87   Temp 97.7 F (36.5 C) (Oral)   Ht 4\' 10"  (1.473 m)   Wt 166 lb (75.3 kg)   SpO2 97%   BMI 34.69 kg/m  BP Readings from Last 3 Encounters:  10/30/22 100/76  10/23/22 (!) 128/92  10/09/22 103/66      Physical Exam Vitals and nursing note reviewed.  Constitutional:      Appearance: Normal appearance. She is normal weight.  HENT:     Head: Normocephalic and atraumatic.     Right Ear: Tympanic membrane, ear  canal and external ear normal.     Left Ear: Tympanic membrane, ear canal and external ear normal.     Nose: Nose normal.     Mouth/Throat:     Mouth: Mucous membranes are moist.     Pharynx: Oropharynx is clear.  Eyes:     Extraocular Movements: Extraocular movements intact.     Conjunctiva/sclera: Conjunctivae normal.     Pupils: Pupils are equal, round, and reactive to light.  Cardiovascular:     Rate and Rhythm: Normal rate and regular rhythm.     Pulses: Normal pulses.     Heart sounds: Normal heart sounds.  Pulmonary:     Effort: Pulmonary effort is normal.     Breath sounds: Normal breath sounds.  Abdominal:     General: Bowel sounds are normal.     Palpations: Abdomen is soft.  Musculoskeletal:        General: Normal range of motion.     Cervical back: Normal range of motion and neck supple.  Skin:    General: Skin is warm and dry.     Capillary Refill: Capillary refill takes less than 2 seconds.  Neurological:     General: No focal deficit present.     Mental Status: She is alert and oriented to person, place, and time. Mental status is at baseline.  Psychiatric:        Mood and Affect: Mood normal.        Behavior: Behavior normal.        Thought Content: Thought content normal.        Judgment: Judgment normal.      No results found for any visits on 10/30/22. Last CBC Lab Results  Component Value Date   WBC 11.1 (H) 10/23/2022   HGB 8.6 (L) 10/23/2022   HCT 28.5 (L) 10/23/2022  MCV 73.6 (L) 10/23/2022   MCH 22.2 (L) 10/23/2022   RDW 15.7 (H) 10/23/2022   PLT 482 (H) 10/23/2022   Last metabolic panel Lab Results  Component Value Date   GLUCOSE 94 10/23/2022   NA 135 10/23/2022   K 3.5 10/23/2022   CL 98 10/23/2022   CO2 27 10/23/2022   BUN 10 10/23/2022   CREATININE 1.00 10/23/2022   EGFR 63 10/23/2022   CALCIUM 9.0 10/23/2022   PROT 6.2 10/23/2022   ALBUMIN 4.2 10/09/2022   LABGLOB 2.3 01/01/2020   AGRATIO 2.0 01/01/2020   BILITOT 0.3  10/23/2022   ALKPHOS 110 10/09/2022   AST 12 10/23/2022   ALT 9 10/23/2022   ANIONGAP 11 10/09/2022   Last lipids Lab Results  Component Value Date   CHOL 158 10/23/2022   HDL 63 10/23/2022   LDLCALC 74 10/23/2022   TRIG 133 10/23/2022   CHOLHDL 2.5 10/23/2022   Last hemoglobin A1c Lab Results  Component Value Date   HGBA1C 6.2 (H) 10/23/2022   Last thyroid functions Lab Results  Component Value Date   TSH 1.25 10/23/2022   Last vitamin D No results found for: "25OHVITD2", "25OHVITD3", "VD25OH" Last vitamin B12 and Folate Lab Results  Component Value Date   VITAMINB12 396 10/23/2022   FOLATE 7.0 10/23/2022        Assessment & Plan:    Routine Health Maintenance and Physical Exam  Immunization History  Administered Date(s) Administered   Janssen (J&J) SARS-COV-2 Vaccination 09/07/2019, 06/16/2020    Health Maintenance  Topic Date Due   COVID-19 Vaccine (3 - 2023-24 season) 11/08/2022 (Originally 02/24/2022)   Zoster Vaccines- Shingrix (1 of 2) 01/22/2023 (Originally 03/26/1978)   INFLUENZA VACCINE  01/25/2023   MAMMOGRAM  02/04/2023   COLONOSCOPY (Pts 45-31yrs Insurance coverage will need to be confirmed)  05/30/2028   Hepatitis C Screening  Completed   HIV Screening  Completed   HPV VACCINES  Aged Out   DTaP/Tdap/Td  Discontinued   PAP SMEAR-Modifier  Discontinued    Discussed health benefits of physical activity, and encouraged her to engage in regular exercise appropriate for her age and condition.  Problem List Items Addressed This Visit     Class 2 severe obesity due to excess calories with serious comorbidity in adult, unspecified BMI (HCC)    Discussed importance of heart healthy diet as well as 150 minutes of moderate intensity exercise weekly, this is limited by her fatigue and chronic joint conditions. Encouraged healthy weight for overall health.      Iron deficiency anemia    Started Ferrous Sulfate daily last week. Will repeat CBC in 3  weeks. Seeing GI this week.      Physical exam, annual - Primary    Today your medical history was reviewed and routine physical exam with labs was performed. Recommend 150 minutes of moderate intensity exercise weekly and consuming a well-balanced diet. Advised to stop smoking if a smoker, avoid smoking if a non-smoker, limit alcohol consumption to 1 drink per day for women and 2 drinks per day for men, and avoid illicit drug use. Counseled on safe sex practices and offered STI testing today. Counseled on the importance of sunscreen use. Counseled in mental health awareness and when to seek medical care. Vaccine maintenance discussed. Appropriate health maintenance items reviewed. Return to office in 1 year for annual physical exam.       Medication management    Discussed medications and she would like to trial off of  Naproxen and Allopurinol. Discussed risks vs benefits. Will follow up in 4 weeks.      Return in about 4 weeks (around 11/27/2022) for follow up with labs 1 week prior.     Park Meo, FNP

## 2022-10-30 NOTE — Assessment & Plan Note (Signed)
Today your medical history was reviewed and routine physical exam with labs was performed. Recommend 150 minutes of moderate intensity exercise weekly and consuming a well-balanced diet. Advised to stop smoking if a smoker, avoid smoking if a non-smoker, limit alcohol consumption to 1 drink per day for women and 2 drinks per day for men, and avoid illicit drug use. Counseled on safe sex practices and offered STI testing today. Counseled on the importance of sunscreen use. Counseled in mental health awareness and when to seek medical care. Vaccine maintenance discussed. Appropriate health maintenance items reviewed. Return to office in 1 year for annual physical exam.  

## 2022-10-30 NOTE — Assessment & Plan Note (Signed)
Started Ferrous Sulfate daily last week. Will repeat CBC in 3 weeks. Seeing GI this week.

## 2022-10-30 NOTE — Assessment & Plan Note (Signed)
Discussed importance of heart healthy diet as well as 150 minutes of moderate intensity exercise weekly, this is limited by her fatigue and chronic joint conditions. Encouraged healthy weight for overall health.

## 2022-10-30 NOTE — Assessment & Plan Note (Signed)
Discussed medications and she would like to trial off of Naproxen and Allopurinol. Discussed risks vs benefits. Will follow up in 4 weeks.

## 2022-11-01 ENCOUNTER — Ambulatory Visit
Admission: RE | Admit: 2022-11-01 | Discharge: 2022-11-01 | Disposition: A | Discharge status: Home / Self Care | Payer: Medicaid Other | Source: Ambulatory Visit | Attending: Orthopedic Surgery | Admitting: Orthopedic Surgery

## 2022-11-01 DIAGNOSIS — M19011 Primary osteoarthritis, right shoulder: Secondary | ICD-10-CM

## 2022-11-02 ENCOUNTER — Other Ambulatory Visit: Payer: Self-pay | Admitting: Physician Assistant

## 2022-11-02 ENCOUNTER — Ambulatory Visit: Payer: Medicaid Other | Admitting: Physician Assistant

## 2022-11-02 ENCOUNTER — Encounter: Payer: Self-pay | Admitting: Physician Assistant

## 2022-11-02 VITALS — BP 118/68 | HR 86 | Ht <= 58 in | Wt 166.0 lb

## 2022-11-02 DIAGNOSIS — D509 Iron deficiency anemia, unspecified: Secondary | ICD-10-CM | POA: Diagnosis not present

## 2022-11-02 DIAGNOSIS — K219 Gastro-esophageal reflux disease without esophagitis: Secondary | ICD-10-CM

## 2022-11-02 DIAGNOSIS — Z86010 Personal history of colonic polyps: Secondary | ICD-10-CM

## 2022-11-02 DIAGNOSIS — R1310 Dysphagia, unspecified: Secondary | ICD-10-CM

## 2022-11-02 MED ORDER — FAMOTIDINE 40 MG PO TABS: 40.0000 mg | ORAL_TABLET | Freq: Every day | ORAL | 8 refills | Status: DC

## 2022-11-02 MED ORDER — CLENPIQ 10-3.5-12 MG-GM -GM/160ML PO SOLN: 1.0000 | ORAL | 0 refills | Status: DC

## 2022-11-02 NOTE — Progress Notes (Signed)
Subjective:    Patient ID: Denise Morales, female    DOB: 1961-01-27, 62 y.o.   MRN: [id number]  HPI  Dail is a pleasant 62 year old white female, established with Dr. Tomasa Rand who comes in today with concerns for increased reflux symptoms particularly at nighttime, pill dysphagia, and also had been advised by her new PCP Kurtis Bushman, FNP to be evaluated by GI after she was recently found to be iron deficient and anemic.  Patient has no prior history of iron deficiency anemia.  Reviewing labs from July 2022 hemoglobin was 10.3 with normal MCV On 10/09/2022 hemoglobin 9.7/hematocrit 3 1.4/MCV 73 10/23/2022 hemoglobin 8.6/hematocrit 28.5 B12 and folate within normal limits Serum iron 17/TIBC 479/sat 4/ferritin 6  He has been started on oral iron. Patient says she has not noticed any melena or hematochezia.  She has no current complaints of abdominal pain. Over the past 3 to 4 months she has had increase in reflux symptoms particularly at nighttime.  She relates episodes of waking up with fluid up in her throat followed by coughing which will make her hoarse the following day.  When she has 1 of these episodes symptoms will last for couple of days and then gradually resolve.  She is not having much heartburn or indigestion during the daytime other than feeling a need to burp frequently. No complaints of solid food dysphagia or liquid dysphagia but has had dysphagia to most of her pills.  Last EGD was done June 2023 with finding of 1 benign distal esophageal stricture measuring 1.1 cm in diameter and less than 1 cm in length she was titi as dilated to 15.5 mm, also noted to have a 5 cm hiatal hernia and mild pyloric stenosis. Biopsy showed mild chronic inactive gastritis, no H. pylori. Last colonoscopy done December 2022 for screening with finding of multiple diverticuli, she had 2 small cecal polyps and a 10 mm sigmoid colon polyp removed.  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  She is currently on Protonix 40 mg twice daily and says she will take a Carafate sometimes with the nocturnal symptoms.     Review of Systems Pertinent positive and negative review of systems were noted in the above HPI section.  All other review of systems was otherwise negative.   Outpatient Encounter Medications as of 11/02/2022  Medication Sig   acetaminophen (TYLENOL) 500 MG tablet Take 500 mg by mouth every 6 (six) hours as needed for mild pain.   busPIRone (BUSPAR) 10 MG tablet Take 10 mg by mouth 3 (three) times daily.   celecoxib (CELEBREX) 200 MG capsule Take 1 capsule (200 mg total) by mouth 2 (two) times daily between meals as needed.   cyclobenzaprine (FLEXERIL) 5 MG tablet Take 1 tablet by mouth 3 (three) times daily as needed.   dicyclomine (BENTYL) 10 MG capsule Take 10 mg by mouth every 6 (six) hours as needed for spasms.   diphenhydrAMINE (BENADRYL) 25 mg capsule Take 25 mg by mouth daily.   escitalopram (LEXAPRO) 20 MG tablet Take 1 tablet (20 mg total) by mouth daily.   famotidine (PEPCID) 40 MG tablet Take 1 tablet (40 mg total) by mouth at bedtime.   Iron, Ferrous Sulfate, 325 (65 Fe) MG TABS Take 325 mg by mouth daily.   Krill Oil 500 MG CAPS Take 1 capsule by mouth in the morning.   levothyroxine (SYNTHROID) 50 MCG tablet Take 50 mcg by  mouth daily.   lisinopril-hydrochlorothiazide (ZESTORETIC) 10-12.5 MG tablet Take 1 tablet by mouth daily.   pantoprazole (PROTONIX) 40 MG tablet TAKE 1 TABLET BY MOUTH TWICE DAILY BEFORE A MEAL   pravastatin (PRAVACHOL) 40 MG tablet Take 1 tablet (40 mg total) by mouth daily.   QUEtiapine (SEROQUEL) 50 MG tablet Take 50 mg by mouth at bedtime.   Sod Picosulfate-Mag Ox-Cit Acd (CLENPIQ) 10-3.5-12 MG-GM -GM/160ML SOLN Take 1 kit by mouth as directed.   sucralfate (CARAFATE) 1 g tablet TAKE 1 TABLET BY MOUTH THREE TIMES DAILY AS NEEDED FOR HEART BURN   Turmeric 500 MG CAPS Take 500  mg by mouth daily.   Vitamin D-Vitamin K (VITAMIN K2-VITAMIN D3 PO) Take 1 tablet by mouth daily.   zolpidem (AMBIEN) 5 MG tablet Take 5 mg by mouth at bedtime as needed.   No facility-administered encounter medications on file as of 11/02/2022.   Allergies  Allergen Reactions   Hydroxyzine     Other reaction(s): Hallucinations   Patient Active Problem List   Diagnosis Date Noted   Iron deficiency anemia 10/30/2022   Physical exam, annual 10/30/2022   Medication management 10/30/2022   Class 2 severe obesity due to excess calories with serious comorbidity in adult, unspecified BMI (HCC) 10/23/2022   Essential hypertension 10/23/2022   Prediabetes 10/23/2022   Hypothyroidism 10/23/2022   Insomnia 06/14/2022   Status post total left knee replacement 07/15/2021   Neoplasm of uncertain behavior of skin 05/25/2021   Generalized osteoarthritis of multiple sites 05/25/2021   Fibromyalgia syndrome 05/25/2021   Restless legs 03/16/2021   Hyperlipidemia 03/16/2021   Anxiety 03/16/2021   Status post right knee replacement 12/24/2020   Unilateral primary osteoarthritis, left knee 11/11/2020   Unilateral primary osteoarthritis, right knee 11/11/2020   Complete tear of right rotator cuff 08/12/2020   Hypertensive disorder 05/05/2019   Social History   Socioeconomic History   Marital status: Married    Spouse name: Not on file   Number of children: Not on file   Years of education: Not on file   Highest education level: Not on file  Occupational History   Not on file  Tobacco Use   Smoking status: Never   Smokeless tobacco: Never  Vaping Use   Vaping Use: Never used  Substance and Sexual Activity   Alcohol use: Not Currently   Drug use: Never   Sexual activity: Not Currently  Other Topics Concern   Not on file  Social History Narrative   Not on file   Social Determinants of Health   Financial Resource Strain: Not on file  Food Insecurity: Not on file  Transportation  Needs: Not on file  Physical Activity: Not on file  Stress: Not on file  Social Connections: Not on file  Intimate Partner Violence: Not on file    Denise Morales's family history includes Arthritis in her sister; Colon polyps in her father and sister; Dementia in her mother; Diabetes in her father, paternal aunt, sister, and sister; Fibromyalgia in her sister and sister; Healthy in her daughter; Heart Problems in her mother, sister, and sister; Hypertension in her father, mother, sister, sister, and sister; Stroke in her mother.      Objective:    Vitals:   11/02/22 0952  BP: 118/68  Pulse: 86    Physical Exam Well-developed well-nourished female/female in no acute distress.  Height, Weight 166 , BMI 34.69  HEENT; nontraumatic normocephalic, EOMI, PE R LA, sclera anicteric. Oropharynx; not done today  Neck; supple, no JVD Cardiovascular; regular rate and rhythm with S1-S2, no murmur rub or gallop Pulmonary; Clear bilaterally Abdomen; soft, nontender, nondistended, no palpable mass or hepatosplenomegaly, bowel sounds are active Rectal;not done today - pt prefers hemocult Skin; benign exam, no jaundice rash or appreciable lesions Extremities; no clubbing cyanosis or edema skin warm and dry Neuro/Psych; alert and oriented x4, grossly nonfocal mood and affect appropriate        Assessment & Plan:   #74 62 year old white female with new iron deficiency anemia.  No prior history of iron deficiency anemia.  Most recently hemoglobin was 8.6 Etiology of the iron deficiency is not clear, most commonly this is secondary to intermittent chronic GI blood loss.  She does have a large hiatal hernia that did not had Cameron erosions documented in the past though this should be considered. Also with history of adenomatous colon polyps, rule out occult neoplasm, rule out AVMs versus other  #2 chronic GERD, predominantly nocturnal symptoms and not well-controlled at present #3 history of distal  esophageal stricture status post dilation June 2023 to 15.5 mm, patient has persistent pill dysphagia-rule out recurrent/progressive distal esophageal stricture  #3 fibromyalgia 4.  Obesity 5.  Osteoarthritis 6.  Hypothyroidism 7.  Hypertension  Plan; continue Protonix 40 mg twice daily, changed the p.m. dose to [facility] dinner Add famotidine 40 mg p.o. nightly Strict antireflux regimen including n.p.o. for 3 hours prior to bedtime, elevation of the back 45 degrees for sleep Continue current oral iron 325 mg once daily-PCP plans follow-up labs and iron studies Avoid NSAIDs Patient will be scheduled for EGD with probable esophageal dilation, and colonoscopy with Dr. Tomasa Rand.  Both procedures were discussed in detail with the patient including indications risk and benefits and she is agreeable to proceed. She is scheduled for upcoming shoulder repair on 12/05/2022.  Procedures will be scheduled for 01/09/2023 with Dr. Tomasa Rand. Further recommendations pending results of above.  Dervin Vore S Elnita Surprenant PA-C 11/02/2022   Cc: Marva Panda, NP

## 2022-11-02 NOTE — Patient Instructions (Addendum)
_______________________________________________________  If your blood pressure at your visit was 140/90 or greater, please contact your primary care physician to follow up on this.  If you are age 64 or younger, your body mass index should be between 19-25. Your Body mass index is 34.69 kg/m. If this is out of the aformentioned range listed, please consider follow up with your Primary Care Provider.  ________________________________________________________  The [facility] providers would like to encourage you to use [facility] to communicate with providers for non-urgent requests or questions.  Due to long hold times on the telephone, sending your provider a message by [facility] may be a faster and more efficient way to get a response.  Please allow 48 business hours for a response.  Please remember that this is for non-urgent requests.  _______________________________________________________  Bonita Quin have been scheduled for an endoscopy and colonoscopy. Please follow the written instructions given to you at your visit today. Please pick up your prep supplies at the pharmacy within the next 1-3 days. If you use inhalers (even only as needed), please bring them with you on the day of your procedure.  Due to recent changes in healthcare laws, you may see the results of your imaging and laboratory studies on MyChart before your provider has had a chance to review them.  We understand that in some cases there may be results that are confusing or concerning to you. Not all laboratory results come back in the same time frame and the provider may be waiting for multiple results in order to interpret others.  Please give [place] 48 hours in order for your provider to thoroughly review all the results before contacting the office for clarification of your results.   CONTINUE: Protonix 40 mg one tablet twice daily before meals.  Please remember to take evening dose 30 minutes prior to dinner meal each information.  We  have sent the following medications to your pharmacy for you to pick up at your convenience:  START: Pepcid 40mg  one tablet at bedtime each night.  Elevate head of bed 45 degrees for sleep Nothing to eat or drink 3 hours before bed  Thank you for entrusting me with your care and choosing [facility].  Amy Esterwood, PA-C

## 2022-11-06 ENCOUNTER — Encounter: Payer: Medicaid Other | Admitting: Gastroenterology

## 2022-11-06 NOTE — Progress Notes (Signed)
Agree with the assessment and plan as outlined by Amy Esterwood, PA-C.  Acy Orsak E. Bay Jarquin, MD  

## 2022-11-14 ENCOUNTER — Other Ambulatory Visit: Payer: Self-pay

## 2022-11-14 MED ORDER — QUETIAPINE FUMARATE 50 MG PO TABS: 50.0000 mg | ORAL_TABLET | Freq: Every day | ORAL | 3 refills | Status: DC

## 2022-11-14 NOTE — Telephone Encounter (Signed)
Pt called in to request a refill of this med:  QUEtiapine (SEROQUEL) 50 MG tablet [[id number]]  LOV: 10/30/22  Please call pt at [phone]

## 2022-11-15 ENCOUNTER — Ambulatory Visit
Admission: RE | Admit: 2022-11-15 | Discharge: 2022-11-15 | Disposition: A | Discharge status: Home / Self Care | Payer: Medicaid Other | Source: Ambulatory Visit | Attending: Family Medicine | Admitting: Family Medicine

## 2022-11-15 DIAGNOSIS — Z1231 Encounter for screening mammogram for malignant neoplasm of breast: Secondary | ICD-10-CM

## 2022-11-21 ENCOUNTER — Other Ambulatory Visit: Payer: Self-pay

## 2022-11-21 ENCOUNTER — Other Ambulatory Visit: Payer: Medicaid Other

## 2022-11-21 DIAGNOSIS — D649 Anemia, unspecified: Secondary | ICD-10-CM

## 2022-11-21 LAB — CBC WITH DIFFERENTIAL/PLATELET
Basophils Absolute: 79 cells/uL (ref 0–200)
HCT: 34.7 % — ABNORMAL LOW (ref 35.0–45.0)
Hemoglobin: 10.4 g/dL — ABNORMAL LOW (ref 11.7–15.5)
Lymphs Abs: 2684 cells/uL (ref 850–3900)
MCH: 24 pg — ABNORMAL LOW (ref 27.0–33.0)
Neutro Abs: 2599 cells/uL (ref 1500–7800)
Platelets: 380 10*3/uL (ref 140–400)
Total Lymphocyte: 44 %

## 2022-11-22 LAB — CBC WITH DIFFERENTIAL/PLATELET
Absolute Monocytes: 500 cells/uL (ref 200–950)
Basophils Relative: 1.3 %
Eosinophils Absolute: 238 cells/uL (ref 15–500)
Eosinophils Relative: 3.9 %
MCHC: 30 g/dL — ABNORMAL LOW (ref 32.0–36.0)
MCV: 80.1 fL (ref 80.0–100.0)
MPV: 9.8 fL (ref 7.5–12.5)
Monocytes Relative: 8.2 %
Neutrophils Relative %: 42.6 %
RBC: 4.33 10*6/uL (ref 3.80–5.10)
RDW: 19.3 % — ABNORMAL HIGH (ref 11.0–15.0)
WBC: 6.1 10*3/uL (ref 3.8–10.8)

## 2022-11-22 LAB — B12 AND FOLATE PANEL
Folate: 12.8 ng/mL
Vitamin B-12: 363 pg/mL (ref 200–1100)

## 2022-11-22 LAB — IRON,TIBC AND FERRITIN PANEL
%SAT: 6 % (calc) — ABNORMAL LOW (ref 16–45)
Ferritin: 14 ng/mL — ABNORMAL LOW (ref 16–288)
Iron: 29 ug/dL — ABNORMAL LOW (ref 45–160)
TIBC: 451 mcg/dL (calc) — ABNORMAL HIGH (ref 250–450)

## 2022-11-22 LAB — RETICULOCYTES
ABS Retic: 95260 cells/uL — ABNORMAL HIGH (ref 20000–80000)
Retic Ct Pct: 2.2 %

## 2022-11-24 NOTE — Pre-Procedure Instructions (Signed)
Surgical Instructions   Your procedure is scheduled on Tuesday, June 11th. Report to [facility] Main Entrance "A" at 05:30 A.M., then check in with the Admitting office. Any questions or running late day of surgery: call [phone]  Questions prior to your surgery date: call [phone], Monday-Friday, 8am-4pm. If you experience any cold or flu symptoms such as cough, fever, chills, shortness of breath, etc. between now and your scheduled surgery, please notify us at the above number.     Remember:  Do not eat after midnight the night before your surgery  You may drink clear liquids until 04:30 AM the morning of your surgery.   Clear liquids allowed are: Water, Non-Citrus Juices (without pulp), Carbonated Beverages, Clear Tea, Black Coffee Only (NO MILK, CREAM OR POWDERED CREAMER of any kind), and Gatorade.   Patient Instructions  The night before surgery:  No food after midnight. ONLY clear liquids after midnight  The day of surgery (if you do NOT have diabetes):  Drink ONE (1) Pre-Surgery Clear Ensure by 04:30 AM the morning of surgery. Drink in one sitting. Do not sip.  This drink was given to you during your hospital  pre-op appointment visit.  Nothing else to drink after completing the  Pre-Surgery Clear Ensure.          If you have questions, please contact your surgeon's office.     Take these medicines the morning of surgery with A SIP OF WATER  dicyclomine (BENTYL)  escitalopram (LEXAPRO)  levothyroxine (SYNTHROID)  pantoprazole (PROTONIX)  pravastatin (PRAVACHOL)     May take these medicines IF NEEDED: busPIRone (BUSPAR)  cyclobenzaprine (FLEXERIL)  ondansetron (ZOFRAN-ODT)   One week prior to surgery, STOP taking any Aspirin (unless otherwise instructed by your surgeon) Aleve, Naproxen, Ibuprofen, Motrin, Advil, Goody's, BC's, all herbal medications, fish oil, celecoxib (CELEBREX) and non-prescription vitamins.                     Do NOT Smoke  (Tobacco/Vaping) for 24 hours prior to your procedure.  If you use a CPAP at night, you may bring your mask/headgear for your overnight stay.   You will be asked to remove any contacts, glasses, piercing's, hearing aid's, dentures/partials prior to surgery. Please bring cases for these items if needed.    Patients discharged the day of surgery will not be allowed to drive home, and someone needs to stay with them for 24 hours.  SURGICAL WAITING ROOM VISITATION Patients may have no more than 2 support people in the waiting area - these visitors may rotate.   Pre-op nurse will coordinate an appropriate time for 1 ADULT support person, who may not rotate, to accompany patient in pre-op.  Children under the age of 41 must have an adult with them who is not the patient and must remain in the main waiting area with an adult.  If the patient needs to stay at the hospital during part of their recovery, the visitor guidelines for inpatient rooms apply.  Please refer to the [facility] website for the visitor guidelines for any additional information.   If you received a COVID test during your pre-op visit  it is requested that you wear a mask when out in public, stay away from anyone that may not be feeling well and notify your surgeon if you develop symptoms. If you have been in contact with anyone that has tested positive in the last 10 days please notify you surgeon.  Pre-operative 5 CHG Bath Instructions   You can play a key role in reducing the risk of infection after surgery. Your skin needs to be as free of germs as possible. You can reduce the number of germs on your skin by washing with CHG (chlorhexidine gluconate) soap before surgery. CHG is an antiseptic soap that kills germs and continues to kill germs even after washing.   DO NOT use if you have an allergy to chlorhexidine/CHG or antibacterial soaps. If your skin becomes reddened or irritated, stop using the CHG and notify one of  our RNs at [phone].   Please shower with the CHG soap starting 4 days before surgery using the following schedule:     Please keep in mind the following:  DO NOT shave, including legs and underarms, starting the day of your first shower.   You may shave your face at any point before/day of surgery.  Place clean sheets on your bed the day you start using CHG soap. Use a clean washcloth (not used since being washed) for each shower. DO NOT sleep with pets once you start using the CHG.   CHG Shower Instructions:  If you choose to wash your hair and private area, wash first with your normal shampoo/soap.  After you use shampoo/soap, rinse your hair and body thoroughly to remove shampoo/soap residue.  Turn the water OFF and apply about 3 tablespoons (45 ml) of CHG soap to a CLEAN washcloth.  Apply CHG soap ONLY FROM YOUR NECK DOWN TO YOUR TOES (washing for 3-5 minutes)  DO NOT use CHG soap on face, private areas, open wounds, or sores.  Pay special attention to the area where your surgery is being performed.  If you are having back surgery, having someone wash your back for you may be helpful. Wait 2 minutes after CHG soap is applied, then you may rinse off the CHG soap.  Pat dry with a clean towel  Put on clean clothes/pajamas   If you choose to wear lotion, please use ONLY the CHG-compatible lotions on the back of this paper.   Additional instructions for the day of surgery: DO NOT APPLY any lotions, deodorants, cologne, or perfumes.   Do not bring valuables to the hospital. [facility] is not responsible for any belongings/valuables. Do not wear nail polish, gel polish, artificial nails, or any other type of covering on natural nails (fingers and toes) Do not wear jewelry or makeup Put on clean/comfortable clothes.  Please brush your teeth.  Ask your nurse before applying any prescription medications to the skin.     CHG Compatible Lotions   Aveeno Moisturizing lotion   Cetaphil Moisturizing Cream  Cetaphil Moisturizing Lotion  Clairol Herbal Essence Moisturizing Lotion, Dry Skin  Clairol Herbal Essence Moisturizing Lotion, Extra Dry Skin  Clairol Herbal Essence Moisturizing Lotion, Normal Skin  Curel Age Defying Therapeutic Moisturizing Lotion with Alpha Hydroxy  Curel Extreme Care Body Lotion  Curel Soothing Hands Moisturizing Hand Lotion  Curel Therapeutic Moisturizing Cream, Fragrance-Free  Curel Therapeutic Moisturizing Lotion, Fragrance-Free  Curel Therapeutic Moisturizing Lotion, Original Formula  Eucerin Daily Replenishing Lotion  Eucerin Dry Skin Therapy Plus Alpha Hydroxy Crme  Eucerin Dry Skin Therapy Plus Alpha Hydroxy Lotion  Eucerin Original Crme  Eucerin Original Lotion  Eucerin Plus Crme Eucerin Plus Lotion  Eucerin TriLipid Replenishing Lotion  Keri Anti-Bacterial Hand Lotion  Keri Deep Conditioning Original Lotion Dry Skin Formula Softly Scented  Keri Deep Conditioning Original Lotion, Fragrance Free Sensitive Skin Formula  Keri Lotion Fast [employer] Fragrance Free Sensitive Skin Formula  Keri Lotion Fast Absorbing Softly Scented Dry Skin Formula  Keri Original Lotion  Keri Skin Renewal Lotion Keri Silky Smooth Lotion  Keri Silky Smooth Sensitive Skin Lotion  Nivea Body Creamy Conditioning Oil  Nivea Body Extra Enriched Lotion  Nivea Body Original Lotion  Nivea Body Sheer Moisturizing Lotion Nivea Crme  Nivea Skin Firming Lotion  NutraDerm 30 Skin Lotion  NutraDerm Skin Lotion  NutraDerm Therapeutic Skin Cream  NutraDerm Therapeutic Skin Lotion  ProShield Protective Hand Cream  Provon moisturizing lotion  Please read over the following fact sheets that you were given.

## 2022-11-27 ENCOUNTER — Encounter (HOSPITAL_COMMUNITY)
Admission: RE | Admit: 2022-11-27 | Discharge: 2022-11-27 | Disposition: A | Discharge status: Home / Self Care | Payer: Medicaid Other | Source: Ambulatory Visit | Attending: Orthopedic Surgery | Admitting: Orthopedic Surgery

## 2022-11-27 ENCOUNTER — Encounter: Payer: Self-pay | Admitting: Family Medicine

## 2022-11-27 ENCOUNTER — Other Ambulatory Visit: Payer: Self-pay

## 2022-11-27 ENCOUNTER — Encounter (HOSPITAL_COMMUNITY): Payer: Self-pay

## 2022-11-27 ENCOUNTER — Ambulatory Visit: Payer: Medicaid Other | Admitting: Family Medicine

## 2022-11-27 VITALS — BP 124/75 | HR 78 | Temp 97.7°F | Resp 18 | Ht <= 58 in | Wt 165.3 lb

## 2022-11-27 VITALS — BP 120/72 | HR 87 | Temp 97.6°F | Ht <= 58 in | Wt 166.0 lb

## 2022-11-27 DIAGNOSIS — Z01818 Encounter for other preprocedural examination: Secondary | ICD-10-CM

## 2022-11-27 DIAGNOSIS — Z01812 Encounter for preprocedural laboratory examination: Secondary | ICD-10-CM | POA: Insufficient documentation

## 2022-11-27 DIAGNOSIS — D508 Other iron deficiency anemias: Secondary | ICD-10-CM

## 2022-11-27 DIAGNOSIS — R42 Dizziness and giddiness: Secondary | ICD-10-CM | POA: Diagnosis not present

## 2022-11-27 HISTORY — DX: Personal history of other diseases of the digestive system: Z87.19

## 2022-11-27 HISTORY — DX: Anemia, unspecified: D64.9

## 2022-11-27 LAB — BASIC METABOLIC PANEL
Anion gap: 10 (ref 5–15)
BUN: 13 mg/dL (ref 8–23)
CO2: 26 mmol/L (ref 22–32)
Calcium: 9.4 mg/dL (ref 8.9–10.3)
Chloride: 100 mmol/L (ref 98–111)
Creatinine, Ser: 1.14 mg/dL — ABNORMAL HIGH (ref 0.44–1.00)
GFR, Estimated: 54 mL/min — ABNORMAL LOW (ref >60)
Glucose, Bld: 105 mg/dL — ABNORMAL HIGH (ref 70–99)
Potassium: 3.7 mmol/L (ref 3.5–5.1)
Sodium: 136 mmol/L (ref 135–145)

## 2022-11-27 LAB — URINALYSIS, ROUTINE W REFLEX MICROSCOPIC
Bilirubin Urine: NEGATIVE
Glucose, UA: NEGATIVE mg/dL
Hgb urine dipstick: NEGATIVE
Ketones, ur: NEGATIVE mg/dL
Leukocytes,Ua: NEGATIVE
Nitrite: NEGATIVE
Protein, ur: NEGATIVE mg/dL
Specific Gravity, Urine: 1.009 (ref 1.005–1.030)
pH: 6 (ref 5.0–8.0)

## 2022-11-27 LAB — CBC
HCT: 36.5 % (ref 36.0–46.0)
Hemoglobin: 10.9 g/dL — ABNORMAL LOW (ref 12.0–15.0)
MCH: 23.8 pg — ABNORMAL LOW (ref 26.0–34.0)
MCHC: 29.9 g/dL — ABNORMAL LOW (ref 30.0–36.0)
MCV: 79.7 fL — ABNORMAL LOW (ref 80.0–100.0)
Platelets: 410 10*3/uL — ABNORMAL HIGH (ref 150–400)
RBC: 4.58 MIL/uL (ref 3.87–5.11)
RDW: 21.2 % — ABNORMAL HIGH (ref 11.5–15.5)
WBC: 11.3 10*3/uL — ABNORMAL HIGH (ref 4.0–10.5)
nRBC: 0 % (ref 0.0–0.2)

## 2022-11-27 LAB — SURGICAL PCR SCREEN
MRSA, PCR: NEGATIVE
Staphylococcus aureus: NEGATIVE

## 2022-11-27 MED ORDER — MECLIZINE HCL 12.5 MG PO TABS: 12.5000 mg | ORAL_TABLET | Freq: Three times a day (TID) | ORAL | 0 refills | Status: DC | PRN

## 2022-11-27 NOTE — Progress Notes (Addendum)
PCP - Kurtis Bushman FNP Cardiologist - Tessa Lerner, MD  PPM/ICD - denies Device Orders -  Rep Notified -   Chest x-ray - na EKG - 10/09/22 Stress Test - 11/18/21 ECHO - 10/25/21 Cardiac Cath - denies  Sleep Study - none CPAP -   Fasting Blood Sugar - na Checks Blood Sugar _____ times a day  Last dose of GLP1 agonist- na  GLP1 instructions:   Blood Thinner Instructions:na Aspirin Instructions:na  ERAS Protcol -clears until 0430 PRE-SURGERY Ensure or G2- no  COVID TEST- na   Anesthesia review: no  Patient denies shortness of breath, fever, cough and chest pain at PAT appointment   All instructions explained to the patient, with a verbal understanding of the material. Patient agrees to go over the instructions while at home for a better understanding. Patient also instructed to wear a mask when out in public prior to surgery. The opportunity to ask questions was provided.

## 2022-11-27 NOTE — Assessment & Plan Note (Signed)
Positive dix hallpike with right sided BPPV. Negative HINTS and orthostatic BPs. Performed Epley maneuver and encouraged patient to perform at home TID with assistance. Will start Meclizine PRN. Instructed to return to office if symptoms not improved.

## 2022-11-27 NOTE — Progress Notes (Deleted)
PCP - Chartered loss adjuster -   PPM/ICD - denies Device Orders -  Rep Notified -   Chest x-ray - na EKG - 10/09/22 Stress Test - 11/18/21 ECHO - 10/25/21 Cardiac Cath - denies  Sleep Study - na CPAP - no  Fasting Blood Sugar - na Checks Blood Sugar _____ times a day  Last dose of GLP1 agonist- na  GLP1 instructions:   Blood Thinner Instructions:na Aspirin Instructions:na  ERAS Protcol -clears [id number] PRE-SURGERY Ensure or G2- no  COVID TEST- na   Anesthesia review: no  Patient denies shortness of breath, fever, cough and chest pain at PAT appointment   All instructions explained to the patient, with a verbal understanding of the material. Patient agrees to go over the instructions while at home for a better understanding. Patient also instructed to wear a mask when out in public prior to surgery. The opportunity to ask questions was provided.

## 2022-11-27 NOTE — Progress Notes (Addendum)
Subjective:  HPI: Denise Morales is a 62 y.o. female presenting on 11/27/2022 for Follow-up (Here to follow up on labs. ) and Dizziness (Pt c/o dizzy spells off and on for last 3 days. Pt states her balance is off also. No nasal congestion or ear pain. )   Dizziness   Patient is in today for anemia follow-up. She also reports dizziness and imbalance for 3 days. She describes this as a feeling of loss of balance when she turns her head or changes position, denies nausea, vision changes, headaches, presyncope, or room spinning. She does have a PMH of vertigo.  Review of Systems  Neurological:  Positive for dizziness.  All other systems reviewed and are negative.   Relevant past medical history reviewed and updated as indicated.   Past Medical History:  Diagnosis Date   Allergy    Anxiety    Arthritis    Depression    Fibromyalgia    GERD (gastroesophageal reflux disease)    Hyperlipidemia    Hypertension    Hypothyroidism    Melanoma (HCC)    face   Thyroid disease      Past Surgical History:  Procedure Laterality Date   ABDOMINAL HYSTERECTOMY  1985   AIKEN OSTEOTOMY Right 06/05/2022   Procedure: Quintella Reichert OSTEOTOMY;  Surgeon: Candelaria Stagers, DPM;  Location: [facility];  Service: Podiatry;  Laterality: Right;   CAPSULOTOMY METATARSOPHALANGEAL Right 06/05/2022   Procedure: CAPSULOTOMY METATARSOPHALANGEAL;  Surgeon: Candelaria Stagers, DPM;  Location: [facility];  Service: Podiatry;  Laterality: Right;   CHOLECYSTECTOMY     COLONOSCOPY     HALLUX VALGUS LAPIDUS Right 06/05/2022   Procedure: HALLUX VALGUS LAPIDUS;  Surgeon: Candelaria Stagers, DPM;  Location: [facility];  Service: Podiatry;  Laterality: Right;   HAMMER TOE SURGERY Right 06/05/2022   Procedure: HAMMER TOE CORRECTION;  Surgeon: Candelaria Stagers, DPM;  Location: [facility];  Service: Podiatry;  Laterality: Right;   SHOULDER ARTHROSCOPY WITH ROTATOR CUFF REPAIR  AND SUBACROMIAL DECOMPRESSION Right 08/12/2020   Procedure: RIGHT SHOULDER ARTHROSCOPY WITH ROTATOR CUFF REPAIR AND SUBACROMIAL DECOMPRESSION;  Surgeon: Kathryne Hitch, MD;  Location: [facility];  Service: Orthopedics;  Laterality: Right;   TOTAL KNEE ARTHROPLASTY Right 12/24/2020   Procedure: RIGHT TOTAL KNEE ARTHROPLASTY;  Surgeon: Kathryne Hitch, MD;  Location: [facility];  Service: Orthopedics;  Laterality: Right;   TOTAL KNEE ARTHROPLASTY Left 07/15/2021   Procedure: LEFT TOTAL KNEE ARTHROPLASTY;  Surgeon: Kathryne Hitch, MD;  Location: [facility];  Service: Orthopedics;  Laterality: Left;    Allergies and medications reviewed and updated.   Current Outpatient Medications:    acetaminophen (TYLENOL) 500 MG tablet, Take 1,000 mg by mouth at bedtime., Disp: , Rfl:    busPIRone (BUSPAR) 10 MG tablet, Take 10 mg by mouth daily as needed (Anxiety)., Disp: , Rfl:    celecoxib (CELEBREX) 200 MG capsule, Take 1 capsule (200 mg total) by mouth 2 (two) times daily between meals as needed., Disp: 60 capsule, Rfl: 3   CLENPIQ 10-3.5-12 MG-GM -GM/175ML SOLN, take 1 kit by mouth as directed, Disp: 350 mL, Rfl: 0   cyclobenzaprine (FLEXERIL) 5 MG tablet, Take 5 mg by mouth 3 (three) times daily as needed for muscle spasms., Disp: , Rfl:    dicyclomine (BENTYL) 10 MG capsule, Take 10 mg by mouth 3 (three) times daily before meals., Disp: , Rfl:    diphenhydrAMINE (BENADRYL) 25 mg capsule,  Take 25 mg by mouth daily., Disp: , Rfl:    escitalopram (LEXAPRO) 20 MG tablet, Take 1 tablet (20 mg total) by mouth daily., Disp: 90 tablet, Rfl: 1   famotidine (PEPCID) 40 MG tablet, Take 1 tablet (40 mg total) by mouth at bedtime., Disp: 30 tablet, Rfl: 8   Iron, Ferrous Sulfate, 325 (65 Fe) MG TABS, Take 325 mg by mouth daily., Disp: 90 tablet, Rfl: 1   Krill Oil 500 MG CAPS, Take 500 mg by mouth in the morning., Disp: , Rfl:    levothyroxine (SYNTHROID) 50 MCG tablet, Take 50 mcg  by mouth daily., Disp: , Rfl:    lisinopril-hydrochlorothiazide (ZESTORETIC) 10-12.5 MG tablet, Take 1 tablet by mouth daily., Disp: 90 tablet, Rfl: 1   meclizine (ANTIVERT) 12.5 MG tablet, Take 1 tablet (12.5 mg total) by mouth 3 (three) times daily as needed for dizziness., Disp: 30 tablet, Rfl: 0   ondansetron (ZOFRAN-ODT) 8 MG disintegrating tablet, Take 8 mg by mouth every 8 (eight) hours as needed for nausea or vomiting., Disp: , Rfl:    pantoprazole (PROTONIX) 40 MG tablet, TAKE 1 TABLET BY MOUTH TWICE DAILY BEFORE A MEAL, Disp: 60 tablet, Rfl: 5   pravastatin (PRAVACHOL) 40 MG tablet, Take 1 tablet (40 mg total) by mouth daily., Disp: 90 tablet, Rfl: 1   QUEtiapine (SEROQUEL) 50 MG tablet, Take 1 tablet (50 mg total) by mouth at bedtime. (Patient taking differently: Take 50 mg by mouth See admin instructions. Every other night alternate with [place]), Disp: 30 tablet, Rfl: 3   sucralfate (CARAFATE) 1 g tablet, TAKE 1 TABLET BY MOUTH THREE TIMES DAILY AS NEEDED FOR HEART BURN, Disp: 90 tablet, Rfl: 1   TURMERIC PO, Take 1,000 mg by mouth daily., Disp: , Rfl:    Vitamin D-Vitamin K (VITAMIN K2-VITAMIN D3 PO), Take 1 tablet by mouth daily. 100 mg/50 mcg, Disp: , Rfl:    zolpidem (AMBIEN) 5 MG tablet, Take 5 mg by mouth See admin instructions. Every other night alternate with Quetiapine, Disp: , Rfl:   Allergies  Allergen Reactions   Hydroxyzine     Other reaction(s): Hallucinations    Objective:   BP 120/72   Pulse 87   Temp 97.6 F (36.4 C)   Ht 4\' 10"  (1.473 m)   Wt 166 lb (75.3 kg)   SpO2 97%   BMI 34.69 kg/m      11/27/2022    9:09 AM 11/02/2022    9:52 AM 10/30/2022    9:05 AM  Vitals with BMI  Height 4\' 10"  4\' 10"  4\' 10"   Weight 166 lbs 166 lbs 166 lbs  BMI 34.7 34.7 34.7  Systolic 120 118 161  Diastolic 72 68 76  Pulse 87 86 87     Physical Exam Vitals and nursing note reviewed.  Constitutional:      Appearance: Normal appearance. She is normal weight.  HENT:      Head: Normocephalic and atraumatic.     Right Ear: Tympanic membrane, ear canal and external ear normal.     Left Ear: Tympanic membrane, ear canal and external ear normal.  Eyes:     Extraocular Movements: Extraocular movements intact.     Pupils: Pupils are equal, round, and reactive to light.  Skin:    General: Skin is warm and dry.  Neurological:     General: No focal deficit present.     Mental Status: She is alert and oriented to person, place, and time. Mental status  is at baseline.  Psychiatric:        Mood and Affect: Mood normal.        Behavior: Behavior normal.        Thought Content: Thought content normal.        Judgment: Judgment normal.     Assessment & Plan:  Vertigo Assessment & Plan: Positive dix hallpike with right sided BPPV. Negative HINTS and orthostatic BPs. Performed Epley maneuver and encouraged patient to perform at home TID with assistance. Will start Meclizine PRN. Instructed to return to office if symptoms not improved.   Other iron deficiency anemia Assessment & Plan: Improving, continue Iron supplements and recheck CBC in 1 month.   Other orders -     Meclizine HCl; Take 1 tablet (12.5 mg total) by mouth 3 (three) times daily as needed for dizziness.  Dispense: 30 tablet; Refill: 0     Follow up plan: No follow-ups on file.  Park Meo, FNP

## 2022-11-27 NOTE — Assessment & Plan Note (Signed)
Improving, continue Iron supplements and recheck CBC in 1 month.

## 2022-12-05 ENCOUNTER — Ambulatory Visit (HOSPITAL_BASED_OUTPATIENT_CLINIC_OR_DEPARTMENT_OTHER): Payer: Medicaid Other | Admitting: Anesthesiology

## 2022-12-05 ENCOUNTER — Encounter (HOSPITAL_COMMUNITY): Payer: Self-pay | Admitting: Orthopedic Surgery

## 2022-12-05 ENCOUNTER — Ambulatory Visit (HOSPITAL_COMMUNITY): Payer: Medicaid Other | Admitting: Anesthesiology

## 2022-12-05 ENCOUNTER — Encounter (HOSPITAL_COMMUNITY)
Admission: RE | Disposition: A | Discharge status: Home / Self Care | Payer: Self-pay | Source: Home / Self Care | Attending: Orthopedic Surgery

## 2022-12-05 ENCOUNTER — Observation Stay (HOSPITAL_COMMUNITY)
Admission: RE | Admit: 2022-12-05 | Discharge: 2022-12-06 | Disposition: A | Discharge status: Home / Self Care | Payer: Medicaid Other | Attending: Orthopedic Surgery | Admitting: Orthopedic Surgery

## 2022-12-05 ENCOUNTER — Observation Stay (HOSPITAL_COMMUNITY): Payer: Medicaid Other

## 2022-12-05 ENCOUNTER — Other Ambulatory Visit: Payer: Self-pay

## 2022-12-05 DIAGNOSIS — D638 Anemia in other chronic diseases classified elsewhere: Secondary | ICD-10-CM

## 2022-12-05 DIAGNOSIS — Z96653 Presence of artificial knee joint, bilateral: Secondary | ICD-10-CM | POA: Diagnosis not present

## 2022-12-05 DIAGNOSIS — M12811 Other specific arthropathies, not elsewhere classified, right shoulder: Secondary | ICD-10-CM | POA: Diagnosis not present

## 2022-12-05 DIAGNOSIS — M7520 Bicipital tendinitis, unspecified shoulder: Secondary | ICD-10-CM | POA: Diagnosis not present

## 2022-12-05 DIAGNOSIS — M7521 Bicipital tendinitis, right shoulder: Secondary | ICD-10-CM

## 2022-12-05 DIAGNOSIS — Z79899 Other long term (current) drug therapy: Secondary | ICD-10-CM | POA: Diagnosis not present

## 2022-12-05 DIAGNOSIS — I10 Essential (primary) hypertension: Secondary | ICD-10-CM | POA: Diagnosis not present

## 2022-12-05 DIAGNOSIS — E039 Hypothyroidism, unspecified: Secondary | ICD-10-CM | POA: Insufficient documentation

## 2022-12-05 DIAGNOSIS — Z85828 Personal history of other malignant neoplasm of skin: Secondary | ICD-10-CM | POA: Insufficient documentation

## 2022-12-05 DIAGNOSIS — M19011 Primary osteoarthritis, right shoulder: Secondary | ICD-10-CM | POA: Diagnosis present

## 2022-12-05 DIAGNOSIS — M75101 Unspecified rotator cuff tear or rupture of right shoulder, not specified as traumatic: Secondary | ICD-10-CM | POA: Diagnosis not present

## 2022-12-05 DIAGNOSIS — Z01818 Encounter for other preprocedural examination: Secondary | ICD-10-CM

## 2022-12-05 DIAGNOSIS — Z96611 Presence of right artificial shoulder joint: Secondary | ICD-10-CM

## 2022-12-05 HISTORY — PX: BICEPT TENODESIS: SHX5116

## 2022-12-05 HISTORY — PX: REVERSE SHOULDER ARTHROPLASTY: SHX5054

## 2022-12-05 SURGERY — ARTHROPLASTY, SHOULDER, TOTAL, REVERSE
Anesthesia: Regional | Site: Shoulder | Laterality: Right

## 2022-12-05 MED ORDER — BUSPIRONE HCL 10 MG PO TABS
10.0000 mg | ORAL_TABLET | Freq: Every day | ORAL | Status: DC | PRN
  Administered 2022-12-05 – 2022-12-06 (×2): 10 mg via ORAL
  Filled 2022-12-05 (×2): qty 1

## 2022-12-05 MED ORDER — FENTANYL CITRATE (PF) 100 MCG/2ML IJ SOLN: 25.0000 ug | INTRAMUSCULAR | Status: DC | PRN

## 2022-12-05 MED ORDER — ORAL CARE MOUTH RINSE: 15.0000 mL | Freq: Once | OROMUCOSAL | Status: AC

## 2022-12-05 MED ORDER — PROPOFOL 10 MG/ML IV BOLUS
INTRAVENOUS | Status: DC | PRN
  Administered 2022-12-05: 50 mg via INTRAVENOUS
  Administered 2022-12-05: 150 mg via INTRAVENOUS

## 2022-12-05 MED ORDER — BUPIVACAINE HCL (PF) 0.5 % IJ SOLN
INTRAMUSCULAR | Status: DC | PRN
  Administered 2022-12-05: 15 mL via PERINEURAL

## 2022-12-05 MED ORDER — LIDOCAINE 2% (20 MG/ML) 5 ML SYRINGE
INTRAMUSCULAR | Status: DC | PRN
  Administered 2022-12-05: 40 mg via INTRAVENOUS

## 2022-12-05 MED ORDER — HYDROMORPHONE HCL 1 MG/ML IJ SOLN: 0.5000 mg | INTRAMUSCULAR | Status: DC | PRN

## 2022-12-05 MED ORDER — ACETAMINOPHEN 325 MG PO TABS: 325.0000 mg | ORAL_TABLET | Freq: Four times a day (QID) | ORAL | Status: DC | PRN

## 2022-12-05 MED ORDER — EPHEDRINE SULFATE-NACL 50-0.9 MG/10ML-% IV SOSY
PREFILLED_SYRINGE | INTRAVENOUS | Status: DC | PRN
  Administered 2022-12-05: 5 mg via INTRAVENOUS
  Administered 2022-12-05: 2.5 mg via INTRAVENOUS

## 2022-12-05 MED ORDER — MENTHOL 3 MG MT LOZG: 1.0000 | LOZENGE | OROMUCOSAL | Status: DC | PRN

## 2022-12-05 MED ORDER — QUETIAPINE FUMARATE 25 MG PO TABS
50.0000 mg | ORAL_TABLET | Freq: Every day | ORAL | Status: DC
  Administered 2022-12-05: 50 mg via ORAL
  Filled 2022-12-05: qty 2

## 2022-12-05 MED ORDER — CHLORHEXIDINE GLUCONATE 0.12 % MT SOLN
15.0000 mL | Freq: Once | OROMUCOSAL | Status: AC
  Administered 2022-12-05: 15 mL via OROMUCOSAL
  Filled 2022-12-05: qty 15

## 2022-12-05 MED ORDER — METHOCARBAMOL 500 MG PO TABS: 500.0000 mg | ORAL_TABLET | Freq: Four times a day (QID) | ORAL | Status: DC | PRN

## 2022-12-05 MED ORDER — EPHEDRINE 5 MG/ML INJ
INTRAVENOUS | Status: AC
  Filled 2022-12-05: qty 5

## 2022-12-05 MED ORDER — ACETAMINOPHEN 500 MG PO TABS
1000.0000 mg | ORAL_TABLET | Freq: Once | ORAL | Status: AC
  Administered 2022-12-05: 1000 mg via ORAL
  Filled 2022-12-05: qty 2

## 2022-12-05 MED ORDER — ESCITALOPRAM OXALATE 20 MG PO TABS
20.0000 mg | ORAL_TABLET | Freq: Every day | ORAL | Status: DC
  Administered 2022-12-05 – 2022-12-06 (×2): 20 mg via ORAL
  Filled 2022-12-05: qty 1
  Filled 2022-12-05 (×2): qty 2
  Filled 2022-12-05: qty 1

## 2022-12-05 MED ORDER — ROCURONIUM BROMIDE 10 MG/ML (PF) SYRINGE
PREFILLED_SYRINGE | INTRAVENOUS | Status: AC
  Filled 2022-12-05: qty 10

## 2022-12-05 MED ORDER — POVIDONE-IODINE 10 % EX SWAB: 2.0000 | Freq: Once | CUTANEOUS | Status: DC

## 2022-12-05 MED ORDER — MECLIZINE HCL 12.5 MG PO TABS: 12.5000 mg | ORAL_TABLET | Freq: Three times a day (TID) | ORAL | Status: DC | PRN

## 2022-12-05 MED ORDER — TRANEXAMIC ACID-NACL 1000-0.7 MG/100ML-% IV SOLN
1000.0000 mg | INTRAVENOUS | Status: AC
  Administered 2022-12-05: 1000 mg via INTRAVENOUS
  Filled 2022-12-05: qty 100

## 2022-12-05 MED ORDER — PHENYLEPHRINE HCL-NACL 20-0.9 MG/250ML-% IV SOLN
INTRAVENOUS | Status: DC | PRN
  Administered 2022-12-05: 25 ug/min via INTRAVENOUS

## 2022-12-05 MED ORDER — METOCLOPRAMIDE HCL 5 MG PO TABS: 5.0000 mg | ORAL_TABLET | Freq: Three times a day (TID) | ORAL | Status: DC | PRN

## 2022-12-05 MED ORDER — ONDANSETRON HCL 4 MG/2ML IJ SOLN
INTRAMUSCULAR | Status: AC
  Filled 2022-12-05: qty 2

## 2022-12-05 MED ORDER — ASPIRIN 81 MG PO TBEC
81.0000 mg | DELAYED_RELEASE_TABLET | Freq: Every day | ORAL | Status: DC
  Administered 2022-12-05 – 2022-12-06 (×2): 81 mg via ORAL
  Filled 2022-12-05 (×2): qty 1

## 2022-12-05 MED ORDER — OXYCODONE HCL 5 MG PO TABS: 5.0000 mg | ORAL_TABLET | ORAL | Status: DC | PRN

## 2022-12-05 MED ORDER — HYDROCHLOROTHIAZIDE 12.5 MG PO TABS
12.5000 mg | ORAL_TABLET | Freq: Every day | ORAL | Status: DC
  Administered 2022-12-05 – 2022-12-06 (×2): 12.5 mg via ORAL
  Filled 2022-12-05 (×2): qty 1

## 2022-12-05 MED ORDER — LISINOPRIL-HYDROCHLOROTHIAZIDE 10-12.5 MG PO TABS: 1.0000 | ORAL_TABLET | Freq: Every day | ORAL | Status: DC

## 2022-12-05 MED ORDER — PHENYLEPHRINE 80 MCG/ML (10ML) SYRINGE FOR IV PUSH (FOR BLOOD PRESSURE SUPPORT)
PREFILLED_SYRINGE | INTRAVENOUS | Status: AC
  Filled 2022-12-05: qty 10

## 2022-12-05 MED ORDER — CEFAZOLIN SODIUM-DEXTROSE 2-4 GM/100ML-% IV SOLN
2.0000 g | Freq: Three times a day (TID) | INTRAVENOUS | Status: AC
  Administered 2022-12-05 – 2022-12-06 (×3): 2 g via INTRAVENOUS
  Filled 2022-12-05 (×3): qty 100

## 2022-12-05 MED ORDER — PROPOFOL 10 MG/ML IV BOLUS
INTRAVENOUS | Status: AC
  Filled 2022-12-05: qty 20

## 2022-12-05 MED ORDER — LACTATED RINGERS IV SOLN: INTRAVENOUS | Status: DC

## 2022-12-05 MED ORDER — PHENOL 1.4 % MT LIQD: 1.0000 | OROMUCOSAL | Status: DC | PRN

## 2022-12-05 MED ORDER — SODIUM CHLORIDE 0.9 % IV SOLN: INTRAVENOUS | Status: DC

## 2022-12-05 MED ORDER — ONDANSETRON HCL 4 MG/2ML IJ SOLN: 4.0000 mg | Freq: Four times a day (QID) | INTRAMUSCULAR | Status: DC | PRN

## 2022-12-05 MED ORDER — VANCOMYCIN HCL 1000 MG IV SOLR
INTRAVENOUS | Status: AC
  Filled 2022-12-05: qty 20

## 2022-12-05 MED ORDER — DEXAMETHASONE SODIUM PHOSPHATE 10 MG/ML IJ SOLN
INTRAMUSCULAR | Status: DC | PRN
  Administered 2022-12-05: 10 mg via INTRAVENOUS

## 2022-12-05 MED ORDER — LIDOCAINE 2% (20 MG/ML) 5 ML SYRINGE
INTRAMUSCULAR | Status: AC
  Filled 2022-12-05: qty 5

## 2022-12-05 MED ORDER — FENTANYL CITRATE (PF) 250 MCG/5ML IJ SOLN
INTRAMUSCULAR | Status: AC
  Filled 2022-12-05: qty 5

## 2022-12-05 MED ORDER — PANTOPRAZOLE SODIUM 40 MG PO TBEC
40.0000 mg | DELAYED_RELEASE_TABLET | Freq: Two times a day (BID) | ORAL | Status: DC
  Administered 2022-12-05 – 2022-12-06 (×2): 40 mg via ORAL
  Filled 2022-12-05 (×2): qty 1

## 2022-12-05 MED ORDER — ROCURONIUM BROMIDE 10 MG/ML (PF) SYRINGE
PREFILLED_SYRINGE | INTRAVENOUS | Status: DC | PRN
  Administered 2022-12-05: 40 mg via INTRAVENOUS

## 2022-12-05 MED ORDER — METOCLOPRAMIDE HCL 5 MG/ML IJ SOLN: 5.0000 mg | Freq: Three times a day (TID) | INTRAMUSCULAR | Status: DC | PRN

## 2022-12-05 MED ORDER — PHENYLEPHRINE 80 MCG/ML (10ML) SYRINGE FOR IV PUSH (FOR BLOOD PRESSURE SUPPORT)
PREFILLED_SYRINGE | INTRAVENOUS | Status: DC | PRN
  Administered 2022-12-05 (×2): 80 ug via INTRAVENOUS

## 2022-12-05 MED ORDER — IRRISEPT - 450ML BOTTLE WITH 0.05% CHG IN STERILE WATER, USP 99.95% OPTIME
TOPICAL | Status: DC | PRN
  Administered 2022-12-05: 450 mL

## 2022-12-05 MED ORDER — DEXAMETHASONE SODIUM PHOSPHATE 10 MG/ML IJ SOLN
INTRAMUSCULAR | Status: AC
  Filled 2022-12-05: qty 1

## 2022-12-05 MED ORDER — FENTANYL CITRATE (PF) 250 MCG/5ML IJ SOLN
INTRAMUSCULAR | Status: DC | PRN
  Administered 2022-12-05 (×2): 50 ug via INTRAVENOUS

## 2022-12-05 MED ORDER — AMISULPRIDE (ANTIEMETIC) 5 MG/2ML IV SOLN: 10.0000 mg | Freq: Once | INTRAVENOUS | Status: DC | PRN

## 2022-12-05 MED ORDER — BUPIVACAINE LIPOSOME 1.3 % IJ SUSP
INTRAMUSCULAR | Status: DC | PRN
  Administered 2022-12-05: 10 mL via PERINEURAL

## 2022-12-05 MED ORDER — ACETAMINOPHEN 500 MG PO TABS
1000.0000 mg | ORAL_TABLET | Freq: Four times a day (QID) | ORAL | Status: DC
  Administered 2022-12-05 (×3): 1000 mg via ORAL
  Filled 2022-12-05 (×4): qty 2

## 2022-12-05 MED ORDER — SUGAMMADEX SODIUM 200 MG/2ML IV SOLN
INTRAVENOUS | Status: DC | PRN
  Administered 2022-12-05: 200 mg via INTRAVENOUS

## 2022-12-05 MED ORDER — MIDAZOLAM HCL 2 MG/2ML IJ SOLN
INTRAMUSCULAR | Status: AC
  Filled 2022-12-05: qty 2

## 2022-12-05 MED ORDER — CEFAZOLIN SODIUM-DEXTROSE 2-4 GM/100ML-% IV SOLN
2.0000 g | INTRAVENOUS | Status: AC
  Administered 2022-12-05: 2 g via INTRAVENOUS
  Filled 2022-12-05: qty 100

## 2022-12-05 MED ORDER — METHOCARBAMOL 1000 MG/10ML IJ SOLN: 500.0000 mg | Freq: Four times a day (QID) | INTRAVENOUS | Status: DC | PRN

## 2022-12-05 MED ORDER — LISINOPRIL 20 MG PO TABS
10.0000 mg | ORAL_TABLET | Freq: Every day | ORAL | Status: DC
  Administered 2022-12-05 – 2022-12-06 (×2): 10 mg via ORAL
  Filled 2022-12-05 (×2): qty 1

## 2022-12-05 MED ORDER — ONDANSETRON HCL 4 MG/2ML IJ SOLN
INTRAMUSCULAR | Status: DC | PRN
  Administered 2022-12-05: 4 mg via INTRAVENOUS

## 2022-12-05 MED ORDER — FERROUS SULFATE 325 (65 FE) MG PO TABS
325.0000 mg | ORAL_TABLET | Freq: Every day | ORAL | Status: DC
  Administered 2022-12-05 – 2022-12-06 (×2): 325 mg via ORAL
  Filled 2022-12-05 (×2): qty 1

## 2022-12-05 MED ORDER — CELECOXIB 100 MG PO CAPS
100.0000 mg | ORAL_CAPSULE | Freq: Two times a day (BID) | ORAL | Status: DC
  Administered 2022-12-05 – 2022-12-06 (×3): 100 mg via ORAL
  Filled 2022-12-05 (×3): qty 1

## 2022-12-05 MED ORDER — LEVOTHYROXINE SODIUM 50 MCG PO TABS
50.0000 ug | ORAL_TABLET | Freq: Every day | ORAL | Status: DC
  Administered 2022-12-06: 50 ug via ORAL
  Filled 2022-12-05: qty 1

## 2022-12-05 MED ORDER — MIDAZOLAM HCL 2 MG/2ML IJ SOLN
INTRAMUSCULAR | Status: DC | PRN
  Administered 2022-12-05 (×2): 1 mg via INTRAVENOUS

## 2022-12-05 MED ORDER — DICYCLOMINE HCL 10 MG PO CAPS
10.0000 mg | ORAL_CAPSULE | Freq: Three times a day (TID) | ORAL | Status: DC
  Administered 2022-12-05 – 2022-12-06 (×3): 10 mg via ORAL
  Filled 2022-12-05 (×3): qty 1

## 2022-12-05 MED ORDER — 0.9 % SODIUM CHLORIDE (POUR BTL) OPTIME
TOPICAL | Status: DC | PRN
  Administered 2022-12-05 (×5): 1000 mL

## 2022-12-05 MED ORDER — VANCOMYCIN HCL 1000 MG IV SOLR
INTRAVENOUS | Status: DC | PRN
  Administered 2022-12-05: 1000 mg via TOPICAL

## 2022-12-05 MED ORDER — ONDANSETRON HCL 4 MG PO TABS: 4.0000 mg | ORAL_TABLET | Freq: Four times a day (QID) | ORAL | Status: DC | PRN

## 2022-12-05 MED ORDER — DOCUSATE SODIUM 100 MG PO CAPS
100.0000 mg | ORAL_CAPSULE | Freq: Two times a day (BID) | ORAL | Status: DC
  Administered 2022-12-05 – 2022-12-06 (×3): 100 mg via ORAL
  Filled 2022-12-05 (×3): qty 1

## 2022-12-05 MED ORDER — FAMOTIDINE 20 MG PO TABS
40.0000 mg | ORAL_TABLET | Freq: Every day | ORAL | Status: DC
  Administered 2022-12-05: 40 mg via ORAL
  Filled 2022-12-05: qty 2

## 2022-12-05 MED ORDER — POVIDONE-IODINE 7.5 % EX SOLN
Freq: Once | CUTANEOUS | Status: DC
  Filled 2022-12-05: qty 118

## 2022-12-05 SURGICAL SUPPLY — 80 items
AID PSTN UNV HD RSTRNT DISP (MISCELLANEOUS) ×1
ALCOHOL 70% 16 OZ (MISCELLANEOUS) ×2 IMPLANT
APL PRP STRL LF DISP 70% ISPRP (MISCELLANEOUS) ×2
BAG COUNTER SPONGE SURGICOUNT (BAG) ×2 IMPLANT
BAG SPNG CNTER NS LX DISP (BAG) ×1
BEARING HUMERAL SHLDER 36M STD (Shoulder) IMPLANT
BIT DRILL 2.7 W/STOP DISP (BIT) IMPLANT
BIT DRILL F/CENTRAL SCRW 3.2 (BIT) ×1
BIT DRILL F/CENTRAL SCRW 3.2MM (BIT) IMPLANT
BIT DRILL TWIST 2.7 (BIT) IMPLANT
BLADE SAW SGTL 13X75X1.27 (BLADE) ×2 IMPLANT
BRNG HUM STD 36 RVRS SHLDR (Shoulder) ×1 IMPLANT
BSPLAT GLND LRG AUG TPR ADPR (Joint) ×1 IMPLANT
CHLORAPREP W/TINT 26 (MISCELLANEOUS) ×2 IMPLANT
COMP REV AUG LG W/TAPER/GLENOI (Joint) ×1 IMPLANT
COMPONENT RV AUG LG W/TAPR/GLN (Joint) IMPLANT
COOLER ICEMAN CLASSIC (MISCELLANEOUS) ×2 IMPLANT
COVER SURGICAL LIGHT HANDLE (MISCELLANEOUS) ×2 IMPLANT
DRAPE INCISE IOBAN 66X45 STRL (DRAPES) ×2 IMPLANT
DRAPE U-SHAPE 47X51 STRL (DRAPES) ×4 IMPLANT
DRILL BIT F/CENTRAL SCRW 3.2MM (BIT) ×1
DRSG AQUACEL AG ADV 3.5X10 (GAUZE/BANDAGES/DRESSINGS) ×2 IMPLANT
ELECT BLADE 4.0 EZ CLEAN MEGAD (MISCELLANEOUS) ×1
ELECT REM PT RETURN 9FT ADLT (ELECTROSURGICAL) ×1
ELECTRODE BLDE 4.0 EZ CLN MEGD (MISCELLANEOUS) ×2 IMPLANT
ELECTRODE REM PT RTRN 9FT ADLT (ELECTROSURGICAL) ×2 IMPLANT
GAUZE SPONGE 4X4 12PLY STRL LF (GAUZE/BANDAGES/DRESSINGS) ×2 IMPLANT
GLENOID SPHERE STD STRL 36MM (Orthopedic Implant) IMPLANT
GLOVE BIOGEL PI IND STRL 6.5 (GLOVE) ×2 IMPLANT
GLOVE BIOGEL PI IND STRL 8 (GLOVE) ×2 IMPLANT
GLOVE ECLIPSE 6.5 STRL STRAW (GLOVE) ×2 IMPLANT
GLOVE ECLIPSE 8.0 STRL XLNG CF (GLOVE) ×2 IMPLANT
GOWN STRL REUS W/ TWL LRG LVL3 (GOWN DISPOSABLE) ×2 IMPLANT
GOWN STRL REUS W/ TWL XL LVL3 (GOWN DISPOSABLE) ×2 IMPLANT
GOWN STRL REUS W/TWL LRG LVL3 (GOWN DISPOSABLE) ×1
GOWN STRL REUS W/TWL XL LVL3 (GOWN DISPOSABLE) ×1
GUIDE BONE RSA SHLD ROT RT (ORTHOPEDIC DISPOSABLE SUPPLIES) IMPLANT
HYDROGEN PEROXIDE 16OZ (MISCELLANEOUS) ×2 IMPLANT
JET LAVAGE IRRISEPT WOUND (IRRIGATION / IRRIGATOR) ×1
KIT BASIN OR (CUSTOM PROCEDURE TRAY) ×2 IMPLANT
KIT TURNOVER KIT B (KITS) ×2 IMPLANT
LAVAGE JET IRRISEPT WOUND (IRRIGATION / IRRIGATOR) ×2 IMPLANT
MANIFOLD NEPTUNE II (INSTRUMENTS) ×2 IMPLANT
MARKER SKIN DUAL TIP RULER LAB (MISCELLANEOUS) IMPLANT
NDL SUT 6 .5 CRC .975X.05 MAYO (NEEDLE) IMPLANT
NEEDLE MAYO TAPER (NEEDLE) ×1
NS IRRIG 1000ML POUR BTL (IV SOLUTION) ×2 IMPLANT
PACK SHOULDER (CUSTOM PROCEDURE TRAY) ×2 IMPLANT
PAD COLD SHLDR WRAP-ON (PAD) ×2 IMPLANT
PIN HUMERAL STMN 3.2MMX9IN (INSTRUMENTS) IMPLANT
PIN STEINMANN THREADED TIP (PIN) IMPLANT
PIN THREADED REVERSE (PIN) IMPLANT
REAMER GUIDE BUSHING SURG DISP (MISCELLANEOUS) IMPLANT
REAMER GUIDE W/SCREW AUG (MISCELLANEOUS) IMPLANT
RESTRAINT HEAD UNIVERSAL NS (MISCELLANEOUS) ×2 IMPLANT
RETRIEVER SUT HEWSON (MISCELLANEOUS) ×2 IMPLANT
SCREW BONE LOCKING 4.75X30X3.5 (Screw) IMPLANT
SCREW BONE STRL 6.5MMX30MM (Screw) IMPLANT
SCREW LOCKING 4.75MMX15MM (Screw) IMPLANT
SCREW LOCKING NS 4.75MMX20MM (Screw) IMPLANT
SHOULDER HUMERAL BEAR 36M STD (Shoulder) ×1 IMPLANT
SLING ARM IMMOBILIZER LRG (SOFTGOODS) ×2 IMPLANT
SOL PREP POV-IOD 4OZ 10% (MISCELLANEOUS) ×2 IMPLANT
SPONGE T-LAP 18X18 ~~LOC~~+RFID (SPONGE) ×2 IMPLANT
STEM HUMERAL STRL 8MMX83MM (Stem) IMPLANT
STRIP CLOSURE SKIN 1/2X4 (GAUZE/BANDAGES/DRESSINGS) ×2 IMPLANT
SUCTION TUBE FRAZIER 10FR DISP (SUCTIONS) ×2 IMPLANT
SUT BROADBAND TAPE 2PK 1.5 (SUTURE) IMPLANT
SUT MNCRL AB 3-0 PS2 18 (SUTURE) ×2 IMPLANT
SUT SILK 2 0 TIES 10X30 (SUTURE) ×2 IMPLANT
SUT VIC AB 0 CT1 27 (SUTURE) ×4
SUT VIC AB 0 CT1 27XBRD ANBCTR (SUTURE) ×8 IMPLANT
SUT VIC AB 1 CT1 27 (SUTURE) ×3
SUT VIC AB 1 CT1 27XBRD ANBCTR (SUTURE) ×4 IMPLANT
SUT VIC AB 2-0 CT1 27 (SUTURE) ×3
SUT VIC AB 2-0 CT1 TAPERPNT 27 (SUTURE) ×6 IMPLANT
SUT VICRYL 0 UR6 27IN ABS (SUTURE) ×4 IMPLANT
TOWEL GREEN STERILE (TOWEL DISPOSABLE) ×2 IMPLANT
TRAY HUM REV SHOULDER STD +6 (Shoulder) IMPLANT
WATER STERILE IRR 1000ML POUR (IV SOLUTION) ×2 IMPLANT

## 2022-12-05 NOTE — H&P (Signed)
Denise Morales is an 62 y.o. female.   Chief Complaint: right shoulder pain HPI: Denise Morales is a 61 y.o. female who presents  reporting right shoulder pain.  Patient had a glenohumeral joint injection 09/12/22 which did not give very good relief.    She has had chronic pain in the shoulder but it has been worse since the fall on 11/23.  Patient is right-hand dominant.  Pain wakes her from sleep at night.  She reports right upper extremity weakness.  Also states the right arm feels heavy.  Describes popping but no neck pain or numbness and tingling.  She likes to push mow the yard.  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  Her mother has since passed away and she is able to get this shoulder issue addressed at this time.  She has troubles with the left shoulder as well.  Right shoulder Radiographs demonstrate arthritis along with decreased acromiohumeral interval.  Patient currently is not working.   Right shoulder MRI demonstrates chronic full-thickness full width tear of the supraspinatus with retraction to the glenoid with grade 2 atrophy of the supraspinatus muscle.  There is also mild to moderate chondrosis of the glenohumeral surface.  Mild AC joint arthritis is also present on the right-hand side..    Past Medical History:  Diagnosis Date   Allergy    Anemia    Anxiety    Arthritis    Depression    Fibromyalgia    GERD (gastroesophageal reflux disease)    History of hiatal hernia    Hyperlipidemia    Hypertension    Hypothyroidism    Melanoma (HCC)    face   Thyroid disease     Past Surgical History:  Procedure Laterality Date   ABDOMINAL HYSTERECTOMY  1985   AIKEN OSTEOTOMY Right 06/05/2022   Procedure: Quintella Reichert OSTEOTOMY;  Surgeon: Candelaria Stagers, DPM;  Location: [facility];  Service: Podiatry;  Laterality: Right;   CAPSULOTOMY METATARSOPHALANGEAL Right 06/05/2022    Procedure: CAPSULOTOMY METATARSOPHALANGEAL;  Surgeon: Candelaria Stagers, DPM;  Location: [facility];  Service: Podiatry;  Laterality: Right;   CHOLECYSTECTOMY     COLONOSCOPY     HALLUX VALGUS LAPIDUS Right 06/05/2022   Procedure: HALLUX VALGUS LAPIDUS;  Surgeon: Candelaria Stagers, DPM;  Location: [facility];  Service: Podiatry;  Laterality: Right;   HAMMER TOE SURGERY Right 06/05/2022   Procedure: HAMMER TOE CORRECTION;  Surgeon: Candelaria Stagers, DPM;  Location: [facility];  Service: Podiatry;  Laterality: Right;   SHOULDER ARTHROSCOPY WITH ROTATOR CUFF REPAIR AND SUBACROMIAL DECOMPRESSION Right 08/12/2020   Procedure: RIGHT SHOULDER ARTHROSCOPY WITH ROTATOR CUFF REPAIR AND SUBACROMIAL DECOMPRESSION;  Surgeon: Kathryne Hitch, MD;  Location: [facility];  Service: Orthopedics;  Laterality: Right;   TOTAL KNEE ARTHROPLASTY Right 12/24/2020   Procedure: RIGHT TOTAL KNEE ARTHROPLASTY;  Surgeon: Kathryne Hitch, MD;  Location: [facility];  Service: Orthopedics;  Laterality: Right;   TOTAL KNEE ARTHROPLASTY Left 07/15/2021   Procedure: LEFT TOTAL KNEE ARTHROPLASTY;  Surgeon: Kathryne Hitch, MD;  Location: [facility];  Service: Orthopedics;  Laterality: Left;    Family History  Problem Relation Age of Onset   Dementia Mother    Heart Problems Mother    Stroke Mother    Hypertension Mother    Hypertension Father    Colon  polyps Father    Diabetes Father    Hypertension Sister    Diabetes Sister        borderline   Fibromyalgia Sister    Hypertension Sister    Colon polyps Sister    Diabetes Sister    Fibromyalgia Sister    Heart Problems Sister    Arthritis Sister    Hypertension Sister    Heart Problems Sister    Healthy Daughter    Diabetes Paternal Aunt    Colon cancer Neg Hx    Esophageal cancer Neg Hx    Rectal cancer Neg Hx    Stomach cancer Neg Hx    Breast cancer Neg Hx    Social History:  reports  that she has never smoked. She has never used smokeless tobacco. She reports that she does not currently use alcohol. She reports that she does not use drugs.  Allergies:  Allergies  Allergen Reactions   Hydroxyzine     Other reaction(s): Hallucinations    Medications Prior to Admission  Medication Sig Dispense Refill   acetaminophen (TYLENOL) 500 MG tablet Take 1,000 mg by mouth at bedtime.     busPIRone (BUSPAR) 10 MG tablet Take 10 mg by mouth daily as needed (Anxiety).     celecoxib (CELEBREX) 200 MG capsule Take 1 capsule (200 mg total) by mouth 2 (two) times daily between meals as needed. 60 capsule 3   CLENPIQ 10-3.5-12 MG-GM -GM/175ML SOLN take 1 kit by mouth as directed 350 mL 0   cyclobenzaprine (FLEXERIL) 5 MG tablet Take 5 mg by mouth 3 (three) times daily as needed for muscle spasms.     dicyclomine (BENTYL) 10 MG capsule Take 10 mg by mouth 3 (three) times daily before meals.     diphenhydrAMINE (BENADRYL) 25 mg capsule Take 25 mg by mouth daily.     escitalopram (LEXAPRO) 20 MG tablet Take 1 tablet (20 mg total) by mouth daily. 90 tablet 1   famotidine (PEPCID) 40 MG tablet Take 1 tablet (40 mg total) by mouth at bedtime. 30 tablet 8   Iron, Ferrous Sulfate, 325 (65 Fe) MG TABS Take 325 mg by mouth daily. 90 tablet 1   Krill Oil 500 MG CAPS Take 500 mg by mouth in the morning.     levothyroxine (SYNTHROID) 50 MCG tablet Take 50 mcg by mouth daily.     lisinopril-hydrochlorothiazide (ZESTORETIC) 10-12.5 MG tablet Take 1 tablet by mouth daily. 90 tablet 1   meclizine (ANTIVERT) 12.5 MG tablet Take 1 tablet (12.5 mg total) by mouth 3 (three) times daily as needed for dizziness. 30 tablet 0   ondansetron (ZOFRAN-ODT) 8 MG disintegrating tablet Take 8 mg by mouth every 8 (eight) hours as needed for nausea or vomiting.     pantoprazole (PROTONIX) 40 MG tablet TAKE 1 TABLET BY MOUTH TWICE DAILY BEFORE A MEAL 60 tablet 5   pravastatin (PRAVACHOL) 40 MG tablet Take 1 tablet (40 mg  total) by mouth daily. 90 tablet 1   QUEtiapine (SEROQUEL) 50 MG tablet Take 1 tablet (50 mg total) by mouth at bedtime. (Patient taking differently: Take 50 mg by mouth See admin instructions. Every other night alternate with ambian) 30 tablet 3   TURMERIC PO Take 1,000 mg by mouth daily.     Vitamin D-Vitamin K (VITAMIN K2-VITAMIN D3 PO) Take 1 tablet by mouth daily. 100 mg/50 mcg     zolpidem (AMBIEN) 5 MG tablet Take 5 mg by mouth See admin instructions. Every  other night alternate with Quetiapine     sucralfate (CARAFATE) 1 g tablet TAKE 1 TABLET BY MOUTH THREE TIMES DAILY AS NEEDED FOR HEART BURN 90 tablet 1    No results found for this or any previous visit (from the past 48 hour(s)). No results found.  Review of Systems  Musculoskeletal:  Positive for arthralgias.  All other systems reviewed and are negative.   Blood pressure (!) 130/94, pulse 75, temperature 97.8 F (36.6 C), temperature source Oral, resp. rate 18, height 4\' 10"  (1.473 m), weight 74.8 kg, SpO2 96 %. Physical Exam Vitals reviewed.  HENT:     Head: Normocephalic.     Nose: Nose normal.     Mouth/Throat:     Mouth: Mucous membranes are moist.  Eyes:     Pupils: Pupils are equal, round, and reactive to light.  Cardiovascular:     Rate and Rhythm: Normal rate.     Pulses: Normal pulses.  Pulmonary:     Effort: Pulmonary effort is normal.  Abdominal:     General: Abdomen is flat.  Musculoskeletal:     Cervical back: Normal range of motion.  Skin:    General: Skin is warm.     Capillary Refill: Capillary refill takes less than 2 seconds.  Neurological:     General: No focal deficit present.     Mental Status: She is alert.  Psychiatric:        Mood and Affect: Mood normal.    Ortho exam demonstrates forward flexion actively above 90 degrees. Passive range of motion of the right shoulder 70/110/165. Has pretty good subscap strength with no Popeye deformity. Biceps tendinitis and tenderness in the  bicipital groove is present. No discrete AC joint tenderness on the right-hand side. There is some coarseness with internal and external rotation of the right shoulder above 90 degrees of abduction. Deltoid is functional. No paresthesias in that right arm.   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  Had a recent ishoulder njection with Dr. Shon Baton.  Plan at this time is right reverse shoulder replacement.  Thin cut CT scan performed for patient specific instrumentation and planning.  Risk and benefits of the procedure discussed with the patient include not limited to infection or vessel damage incomplete pain relief as well as dislocation.  Patient understands the risk and benefits and wishes to proceed.  Rehabilitative process is also discussed.  All questions answered.  Plan for surgery sometime after June 7.   Burnard Bunting, MD 12/05/2022, 6:38 AM

## 2022-12-05 NOTE — Anesthesia Procedure Notes (Signed)
Anesthesia Regional Block: Interscalene brachial plexus block   Pre-Anesthetic Checklist: , timeout performed,  Correct Patient, Correct Site, Correct Laterality,  Correct Procedure, Correct Position, site marked,  Risks and benefits discussed,  Surgical consent,  Pre-op evaluation,  At surgeon's request and post-op pain management  Laterality: Right  Prep: chloraprep       Needles:  Injection technique: Single-shot  Needle Type: Echogenic Stimulator Needle     Needle Length: 9cm  Needle Gauge: 21     Additional Needles:   Procedures:, nerve stimulator,,, ultrasound used (permanent image in chart),,     Nerve Stimulator or Paresthesia:  Response: deltoid and bicep, 0.6 mA  Additional Responses:   Narrative:  Start time: 12/05/2022 7:00 AM End time: 12/05/2022 7:05 AM Injection made incrementally with aspirations every 5 mL.  Performed by: Personally  Anesthesiologist: Marcene Duos, MD

## 2022-12-05 NOTE — Transfer of Care (Signed)
Immediate Anesthesia Transfer of Care Note  Patient: Denise Morales  Procedure(s) Performed: RIGHT REVERSE SHOULDER ARTHROPLASTY (Right: Shoulder) RIGHT BICEPS TENODESIS (Right: Shoulder)  Patient Location: PACU  Anesthesia Type:GA combined with regional for post-op pain  Level of Consciousness: drowsy and patient cooperative  Airway & Oxygen Therapy: Patient Spontanous Breathing and Patient connected to nasal cannula oxygen  Post-op Assessment: Report given to RN and Post -op Vital signs reviewed and stable  Post vital signs: Reviewed and stable  Last Vitals:  Vitals Value Taken Time  BP 107/60 12/05/22 1105  Temp    Pulse 86 12/05/22 1108  Resp 22 12/05/22 1108  SpO2 90 % 12/05/22 1108  Vitals shown include unvalidated device data.  Last Pain:  Vitals:   12/05/22 0628  TempSrc:   PainSc: 5       Patients Stated Pain Goal: 2 (12/05/22 1610)  Complications: No notable events documented.

## 2022-12-05 NOTE — Brief Op Note (Signed)
   12/05/2022  10:46 AM  PATIENT:  Denise Morales  62 y.o. female  PRE-OPERATIVE DIAGNOSIS:  right shoulder rotator cuff arthropathy, biceps tendinitis  POST-OPERATIVE DIAGNOSIS:  right shoulder rotator cuff arthropathy, biceps tendinitis  PROCEDURE:  Procedure(s): RIGHT REVERSE SHOULDER ARTHROPLASTY RIGHT BICEPS TENODESIS  SURGEON:  Surgeon(s): Cammy Copa, MD  ASSISTANT: magnant pa  ANESTHESIA:   general  EBL: 150 ml    Total I/O In: 1200 [I.V.:1000; IV Piggyback:200] Out: 200 [Blood:200]  BLOOD ADMINISTERED: none  DRAINS: none   LOCAL MEDICATIONS USED: vanco  SPECIMEN:  No Specimen  COUNTS:  YES  TOURNIQUET:  * No tourniquets in log *  DICTATION: .Other Dictation: Dictation Number [id number]  PLAN OF CARE: Admit for overnight observation  PATIENT DISPOSITION:  PACU - hemodynamically stable

## 2022-12-05 NOTE — Op Note (Signed)
Denise Morales, Denise Morales MEDICAL RECORD NO: [id number] ACCOUNT NO: [id number] DATE OF BIRTH: 1961/01/19 FACILITY: [facility] LOCATION: [facility] PHYSICIAN: Graylin Shiver. August Saucer, MD  Operative Report   DATE OF PROCEDURE: 12/05/2022  PREOPERATIVE DIAGNOSIS:  Right shoulder arthritis and biceps tendinitis and rotator cuff tearing.  POSTOPERATIVE DIAGNOSIS:  Right shoulder arthritis and biceps tendinitis and rotator cuff tearing.  PROCEDURE:  Right reverse shoulder replacement using large augmented baseplate with central compression screw , 4 peripheral locking screws. Glenosphere 36 standard plus mini humeral stem size 8 with mini humeral tray +6 taper offset, 40 mm in diameter and  standard 36 mm diameter bearing with biceps tenodesis.  SURGEON:  Graylin Shiver. August Saucer, MD  ASSISTANT:  Karenann Cai, PA.  INDICATIONS:  This is a 62 year old patient with end-stage right shoulder arthritis, rotator cuff tearing and pain, who presents for operative management after explanation of risks and benefits.  DESCRIPTION OF PROCEDURE:  The patient was brought to the operating room where general endotracheal anesthesia was induced.  Preoperative antibiotics administered.  Timeout was called.  Right shoulder, arm and hand prescrubbed with hydrogen peroxide  followed by alcohol and Betadine, which was allowed to air dry, then prepped with ChloraPrep solution and draped in sterile manner.  [name] was used to cover the operative field.  Preoperative IV antibiotics and TXA was administered.  After calling  timeout, deltopectoral approach was made.  IrriSept solution utilized.  Cephalic vein mobilized laterally.  Deltoid was mobilized off its anterior attachment to decrease some of the tension on the deltoid.  The pectoralis tendon was released about 1.5 cm  and then the biceps tendon was tenodesed to the remnant stump using 5-0 Vicryl sutures.  Circumflex vessels were ligated.  Axillary nerve was palpated and protected at all times  during the case.  At this time, the rotator interval was opened.   Significant rotator cuff tearing was present on the posterior superior rotator cuff.  Next subscap was detached using a 15 blade from the lesser tuberosity with progressive external rotation. The capsule off the inferior 2 cm of the humeral neck was then  peeled back using a Cobb elevator around to the 5 o'clock position.  The humeral head was dislocated.  Proximal reaming was performed up to a size 8.  Head was cut in 30 degrees of retroversion, which matched her native version.  Next, broaching was  performed up to size 8 and a 38 cap was placed.  At this time, there were some anchors and sutures that were removed from the humeral head from prior rotator cuff tear repair.  Posterior retractor was placed anterior retractor was placed.  Then, the  labrum was circumferentially excised using the cautery with care being taken to avoid injury to the axillary nerve.  [name] was mobilized with releases.  The capsule was removed from the 12 o'clock to 6 o'clock position and elevated in order to  facilitate placement of the guide. The guide was placed and the reaming was performed.  Augment reaming was then performed.  Very good fit was obtained.  Next, the glenoid baseplate was placed with 1 central compression screw, which achieved very good  fixation and then 4 peripheral locking screws.  A good contact was achieved.  Next, we trial reduced with a 36 standard glenosphere and then a +6 humeral offset tray with standard bearing.  This gave very good stability with extension and abduction along  with internal and external rotation, 90 degrees of abduction.  At this time, trial  components were removed.  The true glenosphere was placed.  Canal was irrigated with IrriSept solution and vancomycin powder placed.  Size 8 stem was then placed into  position after we placed 6 suture tapes through the lesser tuberosity.  Next, reduction was performed and  same stability parameters were maintained and thus we put in the +6 offset tray with a 36 standard bearing.  This gave very good stability.   Axillary nerve palpated and found to be intact.  Thorough irrigation was performed with 4 liters of irrigating solution.  Then the subscap was reattached to the lesser tuberosity in 30 degrees of external rotation using the 6 suture tapes.  Good repair  was achieved.  The patient was externally rotated to about 45-50 degrees after the repair.  IrriSept solution and then vancomycin powder placed on top of the prosthesis and the deltopectoral interval was then closed using #1 Vicryl suture followed by  interrupted inverted 0 Vicryl, 2-0 Vicryl suture, and 3-0 Monocryl with Steri-Strips.  Aquacel dressing placed.  [name]'s assistance was required at all times for retraction, opening, closing, mobilization of tissue.  His assistance was a medical  necessity.   PUS D: 12/05/2022 10:52:54 am T: 12/05/2022 11:17:00 am  JOB: [id number]/ [id number]

## 2022-12-05 NOTE — Anesthesia Procedure Notes (Signed)
Procedure Name: Intubation Date/Time: 12/05/2022 7:42 AM  Performed by: Nils Pyle, CRNAPre-anesthesia Checklist: Patient identified, Emergency Drugs available, Suction available and Patient being monitored Patient Re-evaluated:Patient Re-evaluated prior to induction Oxygen Delivery Method: Circle System Utilized Preoxygenation: Pre-oxygenation with 100% oxygen Induction Type: IV induction Ventilation: Mask ventilation without difficulty Laryngoscope Size: Miller and 2 Grade View: Grade I Tube type: Oral Tube size: 7.0 mm Number of attempts: 1 Airway Equipment and Method: Stylet and Oral airway Placement Confirmation: ETT inserted through vocal cords under direct vision, positive ETCO2 and breath sounds checked- equal and bilateral Secured at: 21 cm Tube secured with: Tape Dental Injury: Teeth and Oropharynx as per pre-operative assessment

## 2022-12-05 NOTE — Anesthesia Preprocedure Evaluation (Signed)
Anesthesia Evaluation  Patient identified by MRN, date of birth, ID band Patient awake    Reviewed: Allergy & Precautions, NPO status , Patient's Chart, lab work & pertinent test results  Airway Mallampati: III  TM Distance: <3 FB Neck ROM: Full    Dental  (+) Dental Advisory Given   Pulmonary neg pulmonary ROS   breath sounds clear to auscultation       Cardiovascular hypertension, Pt. on medications  Rhythm:Regular Rate:Normal     Neuro/Psych negative neurological ROS     GI/Hepatic Neg liver ROS, hiatal hernia,GERD  ,,  Endo/Other  Hypothyroidism    Renal/GU negative Renal ROS     Musculoskeletal  (+) Arthritis ,  Fibromyalgia -  Abdominal   Peds  Hematology  (+) Blood dyscrasia, anemia   Anesthesia Other Findings   Reproductive/Obstetrics                             Anesthesia Physical Anesthesia Plan  ASA: 2  Anesthesia Plan: Regional   Post-op Pain Management: Regional block* and Tylenol PO (pre-op)*   Induction: Intravenous  PONV Risk Score and Plan: 2 and Dexamethasone, Ondansetron and Treatment may vary due to age or medical condition  Airway Management Planned: Oral ETT  Additional Equipment:   Intra-op Plan:   Post-operative Plan: Extubation in OR  Informed Consent: I have reviewed the patients History and Physical, chart, labs and discussed the procedure including the risks, benefits and alternatives for the proposed anesthesia with the patient or authorized representative who has indicated his/her understanding and acceptance.     Dental advisory given  Plan Discussed with: CRNA  Anesthesia Plan Comments:        Anesthesia Quick Evaluation

## 2022-12-06 DIAGNOSIS — M19011 Primary osteoarthritis, right shoulder: Secondary | ICD-10-CM | POA: Diagnosis not present

## 2022-12-06 MED ORDER — ASPIRIN 81 MG PO TBEC: 81.0000 mg | DELAYED_RELEASE_TABLET | Freq: Every day | ORAL | 0 refills | Status: DC

## 2022-12-06 MED ORDER — OXYCODONE HCL 5 MG PO TABS: 5.0000 mg | ORAL_TABLET | ORAL | 0 refills | Status: DC | PRN

## 2022-12-06 MED ORDER — DOCUSATE SODIUM 100 MG PO CAPS: 100.0000 mg | ORAL_CAPSULE | Freq: Two times a day (BID) | ORAL | 0 refills | Status: DC

## 2022-12-06 NOTE — Progress Notes (Signed)
  Subjective: Denise Morales is a 62 y.o. female s/p right RSA.  They are POD1.  Pt's pain is controlled.  Patient denies any complaints of chest pain, shortness of breath, abdominal pain. Block still in effect. Ambulating without difficulty  Objective: Vital signs in last 24 hours: Temp:  [97.5 F (36.4 C)-98.2 F (36.8 C)] 97.8 F (36.6 C) (06/12 0738) Pulse Rate:  [75-97] 75 (06/12 0738) Resp:  [15-22] 18 (06/12 0508) BP: (96-125)/(53-77) 118/65 (06/12 0738) SpO2:  [93 %-98 %] 98 % (06/12 0738)  Intake/Output from previous day: 06/11 0701 - 06/12 0700 In: 1320 [P.O.:120; I.V.:1000; IV Piggyback:200] Out: 200 [Blood:200] Intake/Output this shift: No intake/output data recorded.  Exam:  No gross blood or drainage overlying the dressing 2+ radial pulse of the operative extremity Postoperative physical exam somewhat limited by interscalene block but intact EPL, FPL, finger abduction, grip, supination, pronation   Labs: No results for input(s): "HGB" in the last 72 hours. No results for input(s): "WBC", "RBC", "HCT", "PLT" in the last 72 hours. No results for input(s): "NA", "K", "CL", "CO2", "BUN", "CREATININE", "GLUCOSE", "CALCIUM" in the last 72 hours. No results for input(s): "LABPT", "INR" in the last 72 hours.  Assessment/Plan: Pt is POD1 s/p right RSA    -Plan to discharge to home today  -No lifting with the operative arm  -Stay in sling except for showering/sleeping and using CPM machine at home.  No lifting with the operative arm more than 1 to 2 pounds  -Follow-up with Dr. August Saucer in clinic 2 weeks postoperatively     [facility] 12/06/2022, 8:33 AM

## 2022-12-06 NOTE — TOC Transition Note (Signed)
Transition of Care [facility]) - CM/SW Discharge Note   Patient Details  Name: Krisann Mckenna MRN: [id number] Date of Birth: [birth year]  [facility] CM/SW Contact:  Harriet Masson, RN Phone Number: 12/06/2022, 9:26 AM   Clinical Narrative:     Patient stable for discharge.  Has scheduled follow up apt with ortho on AVS. No TOC needs at this time.   Final next level of care: Home/Self Care Barriers to Discharge: Barriers Resolved   Patient Goals and CMS Choice  Return home    Discharge Placement                 home        Discharge Plan and Services Additional resources added to the After Visit Summary for                                       Social Determinants of Health (SDOH) Interventions SDOH Screenings   Depression (PHQ2-9): Medium Risk (10/23/2022)  Tobacco Use: Low Risk  (12/05/2022)     Readmission Risk Interventions     No data to display

## 2022-12-06 NOTE — Evaluation (Signed)
Occupational Therapy Evaluation Patient Details Name: Denise Morales MRN: [id number] DOB: 07/05/1960 Today's Date: 12/06/2022   History of Present Illness Pt is a 62 y.o. female admitted 12/05/22 s/p Right reverse total shoulder arthroplasty due to chronic shoulder pain. PMH significant for history of additional shoulder surgeries including R rotator cuff repair approx. 2 years ago, anemia, anxiety, arthritis, depression, fibromyalgia, GERD (gastroesophageal reflux disease), history of hiatal hernia, hyperlipidemia, hypertension, hypothyroidism, and melanoma (HCC) on face.   Clinical Impression   At baseline, pt completes ADLs and IADLs Independent to Mod I, drives, and performs functional transfers/mobility Independent without an AD. Pt now presents with decreased activity tolerance and decreased safety and independence with ADLs and IADLs secondary to post Right reverse total shoulder arthroplasty with current dominant hand R UE NWB. Pt currently demonstrates ability to complete UB ADLs with Mod I to Min assist, LB ADLs with Supervision to Min assist, and functional mobility/transfers Independent to Supervision without an AD. Pt presents with no cognitive deficits and good safety awareness. OT educated pt and husband regarding R NWB status, R shoulder precautions, techniques for donning/doffing R UE sling, and techniques for increased safety and independence with UE dressing following R shoulder precautions with pt and husband verbalizing and demonstrating understanding and with pt requiring Min assist and husband Independent in assisting pt. Pt has supportive family with 24 hour assistance available. Pt will benefit from acute skilled OT services to address deficits outlined below and to increase safety and independence with ADLs and IADLs. Post acute discharge, pt will benefit from outpatient skilled OT services to maximize rehab potential.       Recommendations for follow up therapy are one  component of a multi-disciplinary discharge planning process, led by the attending physician.  Recommendations may be updated based on patient status, additional functional criteria and insurance authorization.   Assistance Recommended at Discharge Intermittent Supervision/Assistance  Patient can return home with the following A little help with bathing/dressing/bathroom;Assistance with cooking/housework;Assist for transportation;Help with stairs or ramp for entrance    Functional Status Assessment  Patient has had a recent decline in their functional status and demonstrates the ability to make significant improvements in function in a reasonable and predictable amount of time.  Equipment Recommendations  None recommended by OT    Recommendations for Other Services       Precautions / Restrictions Precautions Precautions: Shoulder;Fall Type of Shoulder Precautions: Reverse shoulder arthroplasty Shoulder Interventions: Shoulder sling/immobilizer Precaution Comments: Pt and husband trained in shoulder precautions and in donning/doffing R UE sling with pt and husband verbalizing and demonstrating understanding Required Braces or Orthoses: Sling Restrictions Weight Bearing Restrictions: Yes RUE Weight Bearing: Non weight bearing      Mobility Bed Mobility Overal bed mobility: Modified Independent             General bed mobility comments: with no need for cues to follow R UE precautions    Transfers Overall transfer level: Independent Equipment used: None                      Balance Overall balance assessment: No apparent balance deficits (not formally assessed) (Per RN/chart, pt with episodes of LOB post sx on 6/11 and episode of dizziness aprox. 1 week ago. On this day, pt reported no dizziness/lightheadedness and no LOB noted in sitting or standing during functional tasks and functional mobility in the room.)  ADL either performed or assessed with clinical judgement   ADL Overall ADL's : Needs assistance/impaired Eating/Feeding: Sitting;Modified independent (Extra time due to increased use of nondominant Left hand)   Grooming: Modified independent;Sitting (Extra time due to increased use of nondominant Left hand)   Upper Body Bathing: Minimal assistance;Cueing for compensatory techniques;With caregiver independent assisting;Cueing for UE precautions;Sitting   Lower Body Bathing: Minimal assistance;With caregiver independent assisting;Cueing for compensatory techniques;Sit to/from stand (Cueing for R UE precautions)   Upper Body Dressing : Min guard;With caregiver independent assisting;Cueing for UE precautions;Cueing for compensatory techniques   Lower Body Dressing: Minimal assistance;With caregiver independent assisting;Cueing for compensatory techniques;Sit to/from stand (Cueing for R UE precautions)   Toilet Transfer: Independent;Ambulation;Regular Toilet   Toileting- Architect and Hygiene: Supervision/safety;With caregiver independent assisting;Cueing for compensatory techniques;Sit to/from stand (Cueing for R UE precautions)       Functional mobility during ADLs: Independent General ADL Comments: Pt with decreased activity tolerance noted during functional tasks.     Vision Baseline Vision/History: 1 Wears glasses Ability to See in Adequate Light: 1 Impaired Patient Visual Report: No change from baseline       Perception     Praxis Praxis Praxis tested?: Within functional limits    Pertinent Vitals/Pain Pain Assessment Pain Assessment: No/denies pain Pain Intervention(s): Limited activity within patient's tolerance, Monitored during session, Repositioned     Hand Dominance Right   Extremity/Trunk Assessment Upper Extremity Assessment Upper Extremity Assessment: RUE deficits/detail;LUE deficits/detail RUE Deficits / Details: New R reverse total  arthroplasty; Currently NWB RUE Sensation: WNL RUE Coordination: decreased gross motor LUE Deficits / Details: Generalized weakness, AROM shoulder flexion to approx. 95 degrees. Pt reports this is baseline. LUE Sensation: WNL   Lower Extremity Assessment Lower Extremity Assessment: Overall WFL for tasks assessed   Cervical / Trunk Assessment Cervical / Trunk Assessment: Normal   Communication Communication Communication: No difficulties   Cognition Arousal/Alertness: Awake/alert Behavior During Therapy: WFL for tasks assessed/performed                                         General Comments  VSS on RA throughout session. Pt participated well and was pleasant throughout. Pt's husband present for a portion of the session.    Exercises     Shoulder Instructions      Home Living Family/patient expects to be discharged to:: Private residence Living Arrangements: Spouse/significant other Available Help at Discharge: Family;Available 24 hours/day (Daughter, husband, and 46 year old grandson) Type of Home: House Home Access: Stairs to enter [employer] of Steps: 3 steps and 1 small step up Entrance Stairs-Rails: Right Home Layout: One level     Bathroom Shower/Tub: Producer, television/film/video: Standard Bathroom Accessibility: Yes How Accessible: Accessible via wheelchair;Accessible via walker Home Equipment: Shower seat - built in;Cane - single point;Rolling Walker (2 wheels);Rollator (4 wheels);BSC/3in1;Shower seat;Grab bars - tub/shower;Hand held shower head;Wheelchair - Careers adviser (comment) (transport wheelchair)   Additional Comments: Pt currently not using DME but has DME in storage and accessible as needed due to previously caring for her mother.      Prior Functioning/Environment               Mobility Comments: Indpendent; Pt reports 1 previosu fall in November 2023 and no falls in past 6 months. ADLs Comments: Independent  to Mod I with ADLs and IADLs due to R shoulder pain.  Pt drives.        OT Problem List: Decreased knowledge of precautions;Impaired UE functional use      OT Treatment/Interventions: Self-care/ADL training;Therapeutic exercise;Energy conservation;DME and/or AE instruction;Therapeutic activities;Patient/family education    OT Goals(Current goals can be found in the care plan section) Acute Rehab OT Goals Patient Stated Goal: To heal well and not have any more R shoulder pain OT Goal Formulation: With patient/family Time For Goal Achievement: 12/20/22 Potential to Achieve Goals: Good ADL Goals Pt Will Perform Lower Body Bathing: with modified independence;sit to/from stand (following R UE precautions and with adaptive equipment as needed) Pt Will Perform Upper Body Dressing: with modified independence;sitting (following R UE precautions) Pt Will Perform Lower Body Dressing: with modified independence;sit to/from stand (following R UE precautions and with adaptive equipment as needed) Pt Will Perform Toileting - Clothing Manipulation and hygiene: with modified independence;sit to/from stand (following R UE precautions) Additional ADL Goal #1: Patient will demonstrate ability to Independently state 4 energy conservation techniques to increase safety and independence with ADLs and IADLs in the home.  OT Frequency: Min 2X/week    Co-evaluation              AM-PAC OT "6 Clicks" Daily Activity     Outcome Measure Help from another person eating meals?: None Help from another person taking care of personal grooming?: None Help from another person toileting, which includes using toliet, bedpan, or urinal?: A Little (Supervision) Help from another person bathing (including washing, rinsing, drying)?: A Little Help from another person to put on and taking off regular upper body clothing?: A Little Help from another person to put on and taking off regular lower body clothing?: A Little 6  Click Score: 20   End of Session Nurse Communication: Mobility status;Weight bearing status  Activity Tolerance: Patient tolerated treatment well Patient left: in bed;with call bell/phone within reach;with family/visitor present  OT Visit Diagnosis: Muscle weakness (generalized) (M62.81);Other (comment) (Decreased activity tolernace; Decreased ability to use R UE functionally)                Time: 9629-5284 OT Time Calculation (min): 35 min Charges:  OT General Charges $OT Visit: 1 Visit OT Evaluation $OT Eval Low Complexity: 1 Low OT Treatments $Self Care/Home Management : 8-22 mins  Spiros Greenfeld "Orson Eva., OTR/L, MA Acute Rehab [phone]   Lendon Colonel 12/06/2022, 10:09 AM

## 2022-12-06 NOTE — Anesthesia Postprocedure Evaluation (Signed)
Anesthesia Post Note  Patient: Denise Morales  Procedure(s) Performed: RIGHT REVERSE SHOULDER ARTHROPLASTY (Right: Shoulder) RIGHT BICEPS TENODESIS (Right: Shoulder)     Patient location during evaluation: PACU Anesthesia Type: General Level of consciousness: awake and alert Pain management: pain level controlled Vital Signs Assessment: post-procedure vital signs reviewed and stable Respiratory status: spontaneous breathing, nonlabored ventilation, respiratory function stable and patient connected to nasal cannula oxygen Cardiovascular status: blood pressure returned to baseline and stable Postop Assessment: no apparent nausea or vomiting Anesthetic complications: no   No notable events documented.  Last Vitals:  Vitals:   12/06/22 0508 12/06/22 0738  BP: 104/69 118/65  Pulse: 84 75  Resp: 18   Temp: 36.7 C 36.6 C  SpO2: 97% 98%    Last Pain:  Vitals:   12/06/22 0812  TempSrc:   PainSc: 0-No pain                 Kennieth Rad

## 2022-12-06 NOTE — Progress Notes (Signed)
Patient and family given discharge instructions and verbalize understanding. PIV x 12 removed and patient assisted with dressing. Patient discharged home with family.

## 2022-12-06 NOTE — Progress Notes (Signed)
PT Cancellation Note and Discharge  Patient Details Name: Denise Morales MRN: [id number] DOB: 04/27/1961   Cancelled Treatment:    Reason Eval/Treat Not Completed: PT screened, no needs identified, will sign off. Discussed pt case with OT who reports pt is currently mobilizing at an independent level and does not require a formal PT evaluation at this time. PT signing off. If needs change, please reconsult.     Marylynn Pearson 12/06/2022, 9:54 AM  Conni Slipper, PT, DPT Acute Rehabilitation Services Secure Chat Preferred Office: [phone]

## 2022-12-07 ENCOUNTER — Telehealth: Payer: Self-pay

## 2022-12-07 NOTE — Transitions of Care (Post Inpatient/ED Visit) (Signed)
12/07/2022  Name: Denise Morales MRN: [id number] DOB: 1961-06-07  Today's TOC FU Call Status: Today's TOC FU Call Status:: Successful TOC FU Call Competed TOC FU Call Complete Date: 12/07/22  Transition Care Management Follow-up Telephone Call Date of Discharge: 12/06/22 Discharge Facility: [facility]) Type of Discharge: Inpatient Admission Primary Inpatient Discharge Diagnosis:: reverse total shoulder surgery How have you been since you were released from the hospital?: Better Any questions or concerns?: No  Items Reviewed: Did you receive and understand the discharge instructions provided?: Yes Medications obtained,verified, and reconciled?: Yes (Medications Reviewed) Any new allergies since your discharge?: No Dietary orders reviewed?: Yes Do you have support at home?: Yes People in Home: spouse  Medications Reviewed Today: Medications Reviewed Today     Reviewed by Karena Addison, LPN (Licensed Practical Nurse) on 12/07/22 at 0914  Med List Status: <None>   Medication Order Taking? Sig Documenting Provider Last Dose Status Informant  acetaminophen (TYLENOL) 500 MG tablet [id number] No Take 1,000 mg by mouth at bedtime. [provider] 12/04/2022 Active Self, Pharmacy Records           Med Note Lenoria Farrier   Thu Nov 23, 2022  1:10 PM)    aspirin EC 81 MG tablet [id number]  Take 1 tablet (81 mg total) by mouth daily. Swallow whole. Magnant, Joycie Peek, PA-C  Active   busPIRone (BUSPAR) 10 MG tablet [id number] No Take 10 mg by mouth daily as needed (Anxiety). [provider] Past Week Active Self, Pharmacy Records  celecoxib (CELEBREX) 200 MG capsule [id number] No Take 1 capsule (200 mg total) by mouth 2 (two) times daily between meals as needed. Kathryne Hitch, MD Past Month Active Self, Pharmacy Records  CLENPIQ 10-3.5-12 MG-GM -GM/175ML Criss Rosales [id number] No take 1 kit by mouth as directed Esterwood, Amy S, PA-C Past Month Active Self, Pharmacy  Records  cyclobenzaprine (FLEXERIL) 5 MG tablet [id number] No Take 5 mg by mouth 3 (three) times daily as needed for muscle spasms. [provider] Past Week Active Self, Pharmacy Records  dicyclomine (BENTYL) 10 MG capsule [id number] No Take 10 mg by mouth 3 (three) times daily before meals. [provider] 12/04/2022 Active Self, Pharmacy Records  diphenhydrAMINE (BENADRYL) 25 mg capsule [id number] No Take 25 mg by mouth daily. [provider] 12/04/2022 Active Self, Pharmacy Records  docusate sodium (COLACE) 100 MG capsule [id number]  Take 1 capsule (100 mg total) by mouth 2 (two) times daily. Magnant, Joycie Peek, PA-C  Active   escitalopram (LEXAPRO) 20 MG tablet [id number] No Take 1 tablet (20 mg total) by mouth daily. Park Me[place], [place] 12/04/2022 Active Self, Pharmacy Records  famotidine (PEPCID) 40 MG tablet [id number] No Take 1 tablet (40 mg total) by mouth at bedtime. Esterwood, Amy S, PA-C 12/04/2022 Active Self, Pharmacy Records  Iron, Ferrous Sulfate, 325 (65 Fe) MG TABS [id number] No Take 325 mg by mouth daily. [place], [place] 12/04/2022 Active Self, Pharmacy Records  Krill Oil 500 MG CAPS [id number] No Take 500 mg by mouth in the morning. [provider] 12/04/2022 Active Self, Pharmacy Records  levothyroxine (SYNTHROID) 50 MCG tablet [id number] No Take 50 mcg by mouth daily. [provider] 12/04/2022 Active Self, Pharmacy Records  lisinopril-hydrochlorothiazide (ZESTORETIC) 10-12.5 MG tablet [id number] No Take 1 tablet by mouth daily. Grayce Sessions, NP 12/04/2022 Active Self, Pharmacy Records  meclizine (ANTIVERT) 12.5 MG tablet [id number] No Take 1 tablet (12.5 mg total) by mouth 3 (three) times daily as needed for dizziness.  [place], [place] 12/04/2022 Active Self, Pharmacy Records  ondansetron (ZOFRAN-ODT) 8 MG disintegrating tablet [id number] No Take 8 mg by mouth every 8 (eight) hours as needed for nausea or vomiting. [provider]  Past Month Active Self, Pharmacy Records  oxyCODONE (OXY IR/ROXICODONE) 5 MG immediate release tablet [id number]  Take 1 tablet (5 mg total) by mouth every 4 (four) hours as needed for moderate pain (pain score 4-6). Julieanne Cotton, PA-C  Active   pantoprazole (PROTONIX) 40 MG tablet [id number] No TAKE 1 TABLET BY MOUTH TWICE DAILY BEFORE A MEAL Jenel Lucks, MD 12/05/2022 0425 Active Self, Pharmacy Records  pravastatin (PRAVACHOL) 40 MG tablet [id number] No Take 1 tablet (40 mg total) by mouth daily. Grayce Sessions, NP 12/05/2022 0425 Active Self, Pharmacy Records  QUEtiapine (SEROQUEL) 50 MG tablet [id number] No Take 1 tablet (50 mg total) by mouth at bedtime.  Patient taking differently: Take 50 mg by mouth See admin instructions. Every other night alternate with Thurmon Fair, Binnie Rail, FNP 12/04/2022 Active Self, Pharmacy Records  sucralfate (CARAFATE) 1 g tablet [id number] No TAKE 1 TABLET BY MOUTH THREE TIMES DAILY AS NEEDED FOR HEART BURN Doree Albee, PA-C Taking Active Self, Pharmacy Records  TURMERIC PO [id number] No Take 1,000 mg by mouth daily. [provider] Past Month Active Self, Pharmacy Records  Vitamin D-Vitamin K (VITAMIN K2-VITAMIN D3 PO) [id number] No Take 1 tablet by mouth daily. 100 mg/50 mcg [provider] Past Month Active Self, Pharmacy Records  zolpidem (AMBIEN) 5 MG tablet [id number] No Take 5 mg by mouth See admin instructions. Every other night alternate with Quetiapine [provider] Past Week Active Self, Pharmacy Records            Home Care and Equipment/Supplies: Were Home Health Services Ordered?: NA Any new equipment or medical supplies ordered?: NA  Functional Questionnaire: Do you need assistance with bathing/showering or dressing?: No Do you need assistance with meal preparation?: No Do you need assistance with eating?: No Do you have difficulty maintaining continence: No Do you need assistance with getting  out of bed/getting out of a chair/moving?: No Do you have difficulty managing or taking your medications?: No  Follow up appointments reviewed: PCP Follow-up appointment confirmed?: NA Specialist Hospital Follow-up appointment confirmed?: Yes Date of Specialist follow-up appointment?: 12/20/22 Follow-Up Specialty Provider:: Dr August Saucer Do you need transportation to your follow-up appointment?: No Do you understand care options if your condition(s) worsen?: Yes-patient verbalized understanding    SIGNATURE Karena Addison, LPN [facility] Nurse Health Advisor Direct Dial [phone]

## 2022-12-11 ENCOUNTER — Telehealth: Payer: Self-pay

## 2022-12-11 NOTE — Telephone Encounter (Signed)
Pt called In requesting a new prescription for this med zolpidem (AMBIEN) 5 MG tablet [[id number]].   LOV: 11/27/22  PHARMACY: [id number] - [address] ([place]), [place] - [address] [place] ([place]) [place] [address] Phone: [phone]  Fax: [phone]    CB#: [phone]

## 2022-12-12 ENCOUNTER — Other Ambulatory Visit: Payer: Self-pay | Admitting: Family Medicine

## 2022-12-12 MED ORDER — ZOLPIDEM TARTRATE 5 MG PO TABS: 5.0000 mg | ORAL_TABLET | ORAL | 0 refills | Status: DC

## 2022-12-15 NOTE — Telephone Encounter (Signed)
Pt called in to add another med that needs to be sent in as a new prescription for NP please.  lisinopril-hydrochlorothiazide (ZESTORETIC) 10-12.5 MG tablet [[id number]]  Cb#: [phone]

## 2022-12-18 NOTE — Discharge Summary (Signed)
Physician Discharge Summary      Patient ID: Denise Morales MRN: [id number] DOB/AGE: 12-19-1960 62 y.o.  Admit date: 12/05/2022 Discharge date: 12/06/2022  Admission Diagnoses:  Principal Problem:   S/P reverse total shoulder arthroplasty, right   Discharge Diagnoses:  Same  Surgeries: Procedure(s): RIGHT REVERSE SHOULDER ARTHROPLASTY RIGHT BICEPS TENODESIS on 12/05/2022   Consultants:   Discharged Condition: Stable  Hospital Course: Denise Morales is an 62 y.o. 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  They were brought to the operating room on 12/05/2022 and underwent the above named procedures.  Pt awoke from anesthesia without complication and was transferred to the floor. On POD1, patient's pain was overall controlled.  Block still in effect.  No red flag signs or symptoms.  Discharged home on POD 1..  Pt will f/u with Dr. August Saucer in clinic in ~2 weeks.   Antibiotics given:  Anti-infectives (From admission, onward)    Start     Dose/Rate Route Frequency Ordered Stop   12/05/22 1600  ceFAZolin (ANCEF) IVPB 2g/100 mL premix        2 g 200 mL/hr over 30 Minutes Intravenous Every 8 hours 12/05/22 1158 12/06/22 0908   12/05/22 0949  vancomycin (VANCOCIN) powder  Status:  Discontinued          As needed 12/05/22 0949 12/05/22 1101   12/05/22 0600  ceFAZolin (ANCEF) IVPB 2g/100 mL premix        2 g 200 mL/hr over 30 Minutes Intravenous On call to O.R. 12/05/22 0865 12/05/22 0755     .  Recent vital signs:  Vitals:   12/06/22 0508 12/06/22 0738  BP: 104/69 118/65  Pulse: 84 75  Resp: 18   Temp: 98.1 F (36.7 C) 97.8 F (36.6 C)  SpO2: 97% 98%    Recent laboratory studies:  Results for orders placed or performed during the hospital encounter of 11/27/22  Surgical pcr screen   Specimen: Nasal Mucosa; Nasal Swab  Result Value Ref Range   MRSA, PCR NEGATIVE  NEGATIVE   Staphylococcus aureus NEGATIVE NEGATIVE  CBC  Result Value Ref Range   WBC 11.3 (H) 4.0 - 10.5 K/uL   RBC 4.58 3.87 - 5.11 MIL/uL   Hemoglobin 10.9 (L) 12.0 - 15.0 g/dL   HCT 78.4 69.6 - 29.5 %   MCV 79.7 (L) 80.0 - 100.0 fL   MCH 23.8 (L) 26.0 - 34.0 pg   MCHC 29.9 (L) 30.0 - 36.0 g/dL   RDW 28.4 (H) 13.2 - 44.0 %   Platelets 410 (H) 150 - 400 K/uL   nRBC 0.0 0.0 - 0.2 %  Basic metabolic panel  Result Value Ref Range   Sodium 136 135 - 145 mmol/L   Potassium 3.7 3.5 - 5.1 mmol/L   Chloride 100 98 - 111 mmol/L   CO2 26 22 - 32 mmol/L   Glucose, Bld 105 (H) 70 - 99 mg/dL   BUN 13 8 - 23 mg/dL   Creatinine, Ser 1.02 (H) 0.44 - 1.00 mg/dL   Calcium 9.4 8.9 - 72.5 mg/dL   GFR, Estimated 54 (L) >60 mL/min   Anion gap 10 5 - 15  Urinalysis, Routine w reflex microscopic -Urine, Clean Catch  Result Value Ref Range   Color, Urine STRAW (A) YELLOW   APPearance CLEAR CLEAR   Specific Gravity, Urine 1.009 1.005 - 1.030   pH 6.0 5.0 - 8.0  Glucose, UA NEGATIVE NEGATIVE mg/dL   Hgb urine dipstick NEGATIVE NEGATIVE   Bilirubin Urine NEGATIVE NEGATIVE   Ketones, ur NEGATIVE NEGATIVE mg/dL   Protein, ur NEGATIVE NEGATIVE mg/dL   Nitrite NEGATIVE NEGATIVE   Leukocytes,Ua NEGATIVE NEGATIVE    Discharge Medications:   Allergies as of 12/06/2022       Reactions   Hydroxyzine    Other reaction(s): Hallucinations        Medication List     TAKE these medications    acetaminophen 500 MG tablet Commonly known as: TYLENOL Take 1,000 mg by mouth at bedtime.   aspirin EC 81 MG tablet Take 1 tablet (81 mg total) by mouth daily. Swallow whole.   busPIRone 10 MG tablet Commonly known as: BUSPAR Take 10 mg by mouth daily as needed (Anxiety).   celecoxib 200 MG capsule Commonly known as: CELEBREX Take 1 capsule (200 mg total) by mouth 2 (two) times daily between meals as needed.   Clenpiq 10-3.5-12 MG-GM -GM/175ML Soln Generic drug: Sod Picosulfate-Mag Ox-Cit  Acd take 1 kit by mouth as directed   cyclobenzaprine 5 MG tablet Commonly known as: FLEXERIL Take 5 mg by mouth 3 (three) times daily as needed for muscle spasms.   dicyclomine 10 MG capsule Commonly known as: BENTYL Take 10 mg by mouth 3 (three) times daily before meals.   diphenhydrAMINE 25 mg capsule Commonly known as: BENADRYL Take 25 mg by mouth daily.   docusate sodium 100 MG capsule Commonly known as: COLACE Take 1 capsule (100 mg total) by mouth 2 (two) times daily.   escitalopram 20 MG tablet Commonly known as: Lexapro Take 1 tablet (20 mg total) by mouth daily.   famotidine 40 MG tablet Commonly known as: PEPCID Take 1 tablet (40 mg total) by mouth at bedtime.   Iron (Ferrous Sulfate) 325 (65 Fe) MG Tabs Take 325 mg by mouth daily.   Krill Oil 500 MG Caps Take 500 mg by mouth in the morning.   levothyroxine 50 MCG tablet Commonly known as: SYNTHROID Take 50 mcg by mouth daily.   lisinopril-hydrochlorothiazide 10-12.5 MG tablet Commonly known as: ZESTORETIC Take 1 tablet by mouth daily.   meclizine 12.5 MG tablet Commonly known as: ANTIVERT Take 1 tablet (12.5 mg total) by mouth 3 (three) times daily as needed for dizziness.   ondansetron 8 MG disintegrating tablet Commonly known as: ZOFRAN-ODT Take 8 mg by mouth every 8 (eight) hours as needed for nausea or vomiting.   oxyCODONE 5 MG immediate release tablet Commonly known as: Oxy IR/ROXICODONE Take 1 tablet (5 mg total) by mouth every 4 (four) hours as needed for moderate pain (pain score 4-6).   pantoprazole 40 MG tablet Commonly known as: PROTONIX TAKE 1 TABLET BY MOUTH TWICE DAILY BEFORE A MEAL   pravastatin 40 MG tablet Commonly known as: PRAVACHOL Take 1 tablet (40 mg total) by mouth daily.   QUEtiapine 50 MG tablet Commonly known as: SEROQUEL Take 1 tablet (50 mg total) by mouth at bedtime. What changed:  when to take this additional instructions   sucralfate 1 g tablet Commonly  known as: CARAFATE TAKE 1 TABLET BY MOUTH THREE TIMES DAILY AS NEEDED FOR HEART BURN   TURMERIC PO Take 1,000 mg by mouth daily.   VITAMIN K2-VITAMIN D3 PO Take 1 tablet by mouth daily. 100 mg/50 mcg        Diagnostic Studies: DG Shoulder Right Port  Result Date: 12/05/2022 CLINICAL DATA:  Status post right shoulder surgery.  EXAM: RIGHT SHOULDER - 1 VIEW COMPARISON:  Right shoulder radiographs 08/08/2022 FINDINGS: Interval reverse total shoulder arthroplasty. No perihardware lucency is seen to indicate hardware failure or loosening. Mild postoperative subacromial/subdeltoid air. Normal alignment of the acromioclavicular joint. No acute fracture or dislocation. Decreased lung volumes with basilar subsegmental atelectasis. IMPRESSION: Interval reverse total shoulder arthroplasty without evidence of hardware failure. Electronically Signed   By: Neita Garnet M.D.   On: 12/05/2022 17:48    Disposition: Discharge disposition: 01-Home or Self Care       Discharge Instructions     Call MD / Call 911   Complete by: As directed    If you experience chest pain or shortness of breath, CALL 911 and be transported to the hospital emergency room.  If you develope a fever above 101 F, pus (white drainage) or increased drainage or redness at the wound, or calf pain, call your surgeon's office.   Constipation Prevention   Complete by: As directed    Drink plenty of fluids.  Prune juice may be helpful.  You may use a stool softener, such as Colace (over the counter) 100 mg twice a day.  Use MiraLax (over the counter) for constipation as needed.   Diet - low sodium heart healthy   Complete by: As directed    Discharge instructions   Complete by: As directed    You may shower, dressing is waterproof.  Do not bathe or soak the operative shoulder in a tub, pool.  Use the CPM machine 3 times a day for one hour each time.  No lifting with the operative shoulder. Continue use of the sling.  Follow-up with  Dr. August Saucer or Drema Pry in ~2 weeks on your given appointment date.  We will remove your adhesive bandage at that time.    Dental Antibiotics:  In most cases prophylactic antibiotics for Dental procdeures after total joint surgery are not necessary.  Exceptions are as follows:  1. History of prior total joint infection  2. Severely immunocompromised (Organ Transplant, cancer chemotherapy, Rheumatoid biologic meds such as Humera)  3. Poorly controlled diabetes (A1C &gt; 8.0, blood glucose over 200)  If you have one of these conditions, contact your surgeon for an antibiotic prescription, prior to your dental procedure.   Increase activity slowly as tolerated   Complete by: As directed    Post-operative opioid taper instructions:   Complete by: As directed    POST-OPERATIVE OPIOID TAPER INSTRUCTIONS: It is important to wean off of your opioid medication as soon as possible. If you do not need pain medication after your surgery it is ok to stop day one. Opioids include: Codeine, Hydrocodone(Norco, Vicodin), Oxycodone(Percocet, oxycontin) and hydromorphone amongst others.  Long term and even short term use of opiods can cause: Increased pain response Dependence Constipation Depression Respiratory depression And more.  Withdrawal symptoms can include Flu like symptoms Nausea, vomiting And more Techniques to manage these symptoms Hydrate well Eat regular healthy meals Stay active Use relaxation techniques(deep breathing, meditating, yoga) Do Not substitute Alcohol to help with tapering If you have been on opioids for less than two weeks and do not have pain than it is ok to stop all together.  Plan to wean off of opioids This plan should start within one week post op of your joint replacement. Maintain the same interval or time between taking each dose and first decrease the dose.  Cut the total daily intake of opioids by one tablet each day Next start to  increase the time  between doses. The last dose that should be eliminated is the evening dose.             SignedKarenann Cai 12/18/2022, 9:14 AM

## 2022-12-19 ENCOUNTER — Other Ambulatory Visit: Payer: Self-pay

## 2022-12-19 DIAGNOSIS — Z760 Encounter for issue of repeat prescription: Secondary | ICD-10-CM

## 2022-12-19 DIAGNOSIS — I10 Essential (primary) hypertension: Secondary | ICD-10-CM

## 2022-12-19 MED ORDER — LISINOPRIL-HYDROCHLOROTHIAZIDE 10-12.5 MG PO TABS
1.0000 | ORAL_TABLET | Freq: Every day | ORAL | 1 refills | Status: DC
Start: 2022-12-19 — End: 2023-06-11

## 2022-12-20 ENCOUNTER — Other Ambulatory Visit (INDEPENDENT_AMBULATORY_CARE_PROVIDER_SITE_OTHER): Payer: Medicaid Other

## 2022-12-20 ENCOUNTER — Telehealth: Payer: Self-pay

## 2022-12-20 ENCOUNTER — Encounter: Payer: Self-pay | Admitting: Orthopedic Surgery

## 2022-12-20 ENCOUNTER — Ambulatory Visit (INDEPENDENT_AMBULATORY_CARE_PROVIDER_SITE_OTHER): Payer: Medicaid Other | Admitting: Orthopedic Surgery

## 2022-12-20 DIAGNOSIS — Z96611 Presence of right artificial shoulder joint: Secondary | ICD-10-CM

## 2022-12-20 NOTE — Telephone Encounter (Signed)
Driving is her decision.  I do not really see anything on a exam today that would make me discouraged her from driving but ultimately she has to decide.  She can forward flex and AB duct to 90 and is not taking pain medicines so from my perspective I am not discouraging her.

## 2022-12-20 NOTE — Telephone Encounter (Signed)
Patient seen earlier today for post op appt for her shoulder. She would like to know if she is now able to drive? Please advise.

## 2022-12-20 NOTE — Addendum Note (Signed)
Addended byPrescott Parma on: 12/20/2022 02:09 PM   Modules accepted: Orders

## 2022-12-20 NOTE — Telephone Encounter (Signed)
Per Dr August Saucer pls make sure patient has 4 wk f/u with Franky Macho

## 2022-12-20 NOTE — Progress Notes (Signed)
Post-Op Visit Note   Patient: Denise Morales           Date of Birth: 07-Mar-1961           MRN: [id number] Visit Date: 12/20/2022 PCP: Park Meo, FNP   Assessment & Plan:  Chief Complaint:  Chief Complaint  Patient presents with   Right Shoulder - Routine Post Op    R RSA, BT (surgery date 12-05-22)   Visit Diagnoses:  1. History of arthroplasty of right shoulder     Plan: Denise Morales is a 62 year old patient who underwent right reverse shoulder replacement 12/05/2022.  Doing well.  Doing CPM 2 times a day.  On exam she has good deltoid strength.  Incision intact.  She is almost at 90 of passive and active forward flexion and abduction.  Plan at this time is to discontinue sling.  Continue with CPM machine.  Physical therapy either here or at [facility] for passive range of motion and active assisted range of motion 2-3 times a week for 4 weeks with 4-week clinical recheck.  Follow-Up Instructions: No follow-ups on file.   Orders:  Orders Placed This Encounter  Procedures   XR Shoulder Right   No orders of the defined types were placed in this encounter.   Imaging: XR Shoulder Right  Result Date: 12/20/2022 AP axillary outlet radiographs right shoulder reviewed.  Reverse shoulder prosthesis in good position alignment with no complicating features.  Shoulder is located.   PMFS History: Patient Active Problem List   Diagnosis Date Noted   S/P reverse total shoulder arthroplasty, right 12/05/2022   Vertigo 11/27/2022   Iron deficiency anemia 10/30/2022   Physical exam, annual 10/30/2022   Medication management 10/30/2022   Class 2 severe obesity due to excess calories with serious comorbidity in adult, unspecified BMI (HCC) 10/23/2022   Essential hypertension 10/23/2022   Prediabetes 10/23/2022   Hypothyroidism 10/23/2022   Insomnia 06/14/2022   Status post total left knee replacement 07/15/2021   Neoplasm of uncertain behavior of skin 05/25/2021   Generalized  osteoarthritis of multiple sites 05/25/2021   Fibromyalgia syndrome 05/25/2021   Restless legs 03/16/2021   Hyperlipidemia 03/16/2021   Anxiety 03/16/2021   Status post right knee replacement 12/24/2020   Unilateral primary osteoarthritis, left knee 11/11/2020   Unilateral primary osteoarthritis, right knee 11/11/2020   Complete tear of right rotator cuff 08/12/2020   Hypertensive disorder 05/05/2019   Past Medical History:  Diagnosis Date   Allergy    Anemia    Anxiety    Arthritis    Depression    Fibromyalgia    GERD (gastroesophageal reflux disease)    History of hiatal hernia    Hyperlipidemia    Hypertension    Hypothyroidism    Melanoma (HCC)    face   Thyroid disease     Family History  Problem Relation Age of Onset   Dementia Mother    Heart Problems Mother    Stroke Mother    Hypertension Mother    Hypertension Father    Colon polyps Father    Diabetes Father    Hypertension Sister    Diabetes Sister        borderline   Fibromyalgia Sister    Hypertension Sister    Colon polyps Sister    Diabetes Sister    Fibromyalgia Sister    Heart Problems Sister    Arthritis Sister    Hypertension Sister    Heart Problems Sister  Healthy Daughter    Diabetes Paternal Aunt    Colon cancer Neg Hx    Esophageal cancer Neg Hx    Rectal cancer Neg Hx    Stomach cancer Neg Hx    Breast cancer Neg Hx     Past Surgical History:  Procedure Laterality Date   ABDOMINAL HYSTERECTOMY  1985   AIKEN OSTEOTOMY Right 06/05/2022   Procedure: Quintella Reichert OSTEOTOMY;  Surgeon: Candelaria Stagers, DPM;  Location: [facility];  Service: Podiatry;  Laterality: Right;   BICEPT TENODESIS Right 12/05/2022   Procedure: RIGHT BICEPS TENODESIS;  Surgeon: Cammy Copa, MD;  [facility]: [facility] OR;  Service: Orthopedics;  Laterality: Right;   CAPSULOTOMY METATARSOPHALANGEAL Right 06/05/2022   Procedure: CAPSULOTOMY METATARSOPHALANGEAL;  Surgeon: Candelaria Stagers, DPM;  Location:  [facility];  Service: Podiatry;  Laterality: Right;   CHOLECYSTECTOMY     COLONOSCOPY     HALLUX VALGUS LAPIDUS Right 06/05/2022   Procedure: HALLUX VALGUS LAPIDUS;  Surgeon: Candelaria Stagers, DPM;  Location: [facility];  Service: Podiatry;  Laterality: Right;   HAMMER TOE SURGERY Right 06/05/2022   Procedure: HAMMER TOE CORRECTION;  Surgeon: Candelaria Stagers, DPM;  Location: [facility] [place];  Service: Podiatry;  Laterality: Right;   REVERSE SHOULDER ARTHROPLASTY Right 12/05/2022   Procedure: RIGHT REVERSE SHOULDER ARTHROPLASTY;  Surgeon: Cammy Copa, MD;  Location: [facility] OR;  Service: Orthopedics;  Laterality: Right;   SHOULDER ARTHROSCOPY WITH ROTATOR CUFF REPAIR AND SUBACROMIAL DECOMPRESSION Right 08/12/2020   Procedure: RIGHT SHOULDER ARTHROSCOPY WITH ROTATOR CUFF REPAIR AND SUBACROMIAL DECOMPRESSION;  Surgeon: Kathryne Hitch, MD;  Location: [facility];  Service: Orthopedics;  Laterality: Right;   TOTAL KNEE ARTHROPLASTY Right 12/24/2020   Procedure: RIGHT TOTAL KNEE ARTHROPLASTY;  Surgeon: Kathryne Hitch, MD;  Location: [facility];  Service: Orthopedics;  Laterality: Right;   TOTAL KNEE ARTHROPLASTY Left 07/15/2021   Procedure: LEFT TOTAL KNEE ARTHROPLASTY;  Surgeon: Kathryne Hitch, MD;  Location: [facility];  Service: Orthopedics;  Laterality: Left;   Social History   Occupational History   Not on file  Tobacco Use   Smoking status: Never   Smokeless tobacco: Never  Vaping Use   Vaping Use: Never used  Substance and Sexual Activity   Alcohol use: Not Currently   Drug use: Never   Sexual activity: Not Currently

## 2022-12-21 NOTE — Telephone Encounter (Signed)
Patient was already informed via mychart

## 2022-12-24 ENCOUNTER — Encounter: Payer: Self-pay | Admitting: Orthopedic Surgery

## 2022-12-24 DIAGNOSIS — M7521 Bicipital tendinitis, right shoulder: Secondary | ICD-10-CM

## 2022-12-24 DIAGNOSIS — M19011 Primary osteoarthritis, right shoulder: Secondary | ICD-10-CM

## 2022-12-25 HISTORY — PX: COLONOSCOPY WITH ESOPHAGOGASTRODUODENOSCOPY (EGD): SHX5779

## 2022-12-25 NOTE — Telephone Encounter (Signed)
Ok to come in wednesday

## 2022-12-27 ENCOUNTER — Ambulatory Visit (INDEPENDENT_AMBULATORY_CARE_PROVIDER_SITE_OTHER): Payer: Medicaid Other | Admitting: Surgical

## 2022-12-27 ENCOUNTER — Other Ambulatory Visit (INDEPENDENT_AMBULATORY_CARE_PROVIDER_SITE_OTHER): Payer: Medicaid Other

## 2022-12-27 DIAGNOSIS — Z96611 Presence of right artificial shoulder joint: Secondary | ICD-10-CM

## 2022-12-29 ENCOUNTER — Other Ambulatory Visit: Payer: Self-pay | Admitting: Family Medicine

## 2022-12-29 DIAGNOSIS — E782 Mixed hyperlipidemia: Secondary | ICD-10-CM

## 2022-12-29 DIAGNOSIS — Z760 Encounter for issue of repeat prescription: Secondary | ICD-10-CM

## 2022-12-29 NOTE — Telephone Encounter (Signed)
Requested Prescriptions  Pending Prescriptions Disp Refills   pravastatin (PRAVACHOL) 40 MG tablet 90 tablet     Sig: Take 1 tablet (40 mg total) by mouth daily.     Cardiovascular:  Antilipid - Statins Failed - 12/29/2022 11:29 AM      Failed - Valid encounter within last 12 months    Recent Outpatient Visits           1 year ago Anxiety and depression   [facility] [place], Odette Horns, MD   1 year ago Gastroesophageal reflux disease without esophagitis   [place] [facility] Grayce Sessions, NP   2 years ago Encounter for screening mammogram for malignant neoplasm of breast   [place] [facility] Grayce Sessions, NP   2 years ago Adjustment disorder with mixed anxiety and depressed mood   [place] [facility] Grayce Sessions, NP   2 years ago Adjustment disorder with mixed anxiety and depressed mood   [place] [facility] Grayce Sessions, NP              Failed - Lipid Panel in normal range within the last 12 months    Cholesterol, Total  Date Value Ref Range Status  03/30/2020 157 100 - 199 mg/dL Final   Cholesterol  Date Value Ref Range Status  10/23/2022 158 <200 mg/dL Final   LDL Cholesterol (Calc)  Date Value Ref Range Status  10/23/2022 74 mg/dL (calc) Final    Comment:    Reference range: <100 . Desirable range <100 mg/dL for primary prevention;   <70 mg/dL for patients with CHD or diabetic patients  with > or = 2 CHD risk factors. Marland Kitchen LDL-C is now calculated using the Martin-Hopkins  calculation, which is a validated novel method providing  better accuracy than the Friedewald equation in the  estimation of LDL-C.  Horald Pollen et al. Lenox Ahr. 8413;244(01): 2061-2068  (http://education.[facility].com/faq/FAQ164)    HDL  Date Value Ref Range Status  10/23/2022 63 > OR = 50 mg/dL Final  02/72/5366 47 >44 mg/dL Final   Triglycerides   Date Value Ref Range Status  10/23/2022 133 <150 mg/dL Final         Passed - Patient is not pregnant       levothyroxine (SYNTHROID) 50 MCG tablet      Sig: Take 1 tablet (50 mcg total) by mouth daily.     Endocrinology:  Hypothyroid Agents Failed - 12/29/2022 11:29 AM      Failed - Valid encounter within last 12 months    Recent Outpatient Visits           1 year ago Anxiety and depression   [place] [place], Odette Horns, MD   1 year ago Gastroesophageal reflux disease without esophagitis   [place] [facility] Grayce Sessions, NP   2 years ago Encounter for screening mammogram for malignant neoplasm of breast   [place] [facility] Grayce Sessions, NP   2 years ago Adjustment disorder with mixed anxiety and depressed mood   [employer] [facility] Grayce Sessions, NP   2 years ago Adjustment disorder with mixed anxiety and depressed mood   [place] [facility] Grayce Sessions, NP              Passed - TSH in normal range and within 360 days  TSH  Date Value Ref Range Status  10/23/2022 1.25 0.40 - 4.50 mIU/L Final         Refused Prescriptions Disp Refills   celecoxib (CELEBREX) 200 MG capsule 60 capsule     Sig: Take 1 capsule (200 mg total) by mouth 2 (two) times daily between meals as needed.     Analgesics:  COX2 Inhibitors Failed - 12/29/2022 11:29 AM      Failed - Manual Review: Labs are only required if the patient has taken medication for more than 8 weeks.      Failed - HGB in normal range and within 360 days    Hemoglobin  Date Value Ref Range Status  11/27/2022 10.9 (L) 12.0 - 15.0 g/dL Final  84/13/2440 10.2 11.1 - 15.9 g/dL Final         Failed - Cr in normal range and within 360 days    Creat  Date Value Ref Range Status  10/23/2022 1.00 0.50 - 1.05 mg/dL Final   Creatinine, Ser  Date Value Ref Range Status  11/27/2022  1.14 (H) 0.44 - 1.00 mg/dL Final         Failed - Valid encounter within last 12 months    Recent Outpatient Visits           1 year ago Anxiety and depression   [place] [facility] [place], Odette Horns, MD   1 year ago Gastroesophageal reflux disease without esophagitis   [place] [facility] Grayce Sessions, NP   2 years ago Encounter for screening mammogram for malignant neoplasm of breast   [place] [facility] Grayce Sessions, NP   2 years ago Adjustment disorder with mixed anxiety and depressed mood   [place] [facility] Grayce Sessions, NP   2 years ago Adjustment disorder with mixed anxiety and depressed mood   [place] [facility] Grayce Sessions, NP              Passed - HCT in normal range and within 360 days    HCT  Date Value Ref Range Status  11/27/2022 36.5 36.0 - 46.0 % Final   Hematocrit  Date Value Ref Range Status  01/01/2020 40.5 34.0 - 46.6 % Final         Passed - AST in normal range and within 360 days    AST  Date Value Ref Range Status  10/23/2022 12 10 - 35 U/L Final         Passed - ALT in normal range and within 360 days    ALT  Date Value Ref Range Status  10/23/2022 9 6 - 29 U/L Final         Passed - eGFR is 30 or above and within 360 days    GFR calc Af Amer  Date Value Ref Range Status  01/01/2020 60 >59 mL/min/1.73 Final    Comment:    **[facility] currently reports eGFR in compliance with the current**   recommendations of the [employer]. [facility] will   update reporting as new guidelines are published from the NKF-ASN   Task force.    GFR, Estimated  Date Value Ref Range Status  11/27/2022 54 (L) >60 mL/min Final    Comment:    (NOTE) Calculated using the CKD-EPI Creatinine Equation (2021)    eGFR  Date Value Ref Range Status  10/23/2022 63 > OR = 60 mL/min/1.21m2 Final  Passed -  Patient is not pregnant

## 2022-12-29 NOTE — Telephone Encounter (Signed)
Requested medication (s) are due for refill today: -  Requested medication (s) are on the active medication list:-  Last refill:  pravastatin: 04/02/21 Levothyroxine: historical med   Future visit scheduled: no  Notes to clinic:  levothyroxine: hx provider and med -    Requested Prescriptions  Pending Prescriptions Disp Refills   pravastatin (PRAVACHOL) 40 MG tablet 90 tablet     Sig: Take 1 tablet (40 mg total) by mouth daily.     Cardiovascular:  Antilipid - Statins Failed - 12/29/2022 11:29 AM      Failed - Valid encounter within last 12 months    Recent Outpatient Visits           1 year ago Anxiety and depression   [facility] [place], Odette Horns, MD   1 year ago Gastroesophageal reflux disease without esophagitis   [place] [facility] Grayce Sessions, NP   2 years ago Encounter for screening mammogram for malignant neoplasm of breast   [place] [facility] Grayce Sessions, NP   2 years ago Adjustment disorder with mixed anxiety and depressed mood   [place] [facility] Grayce Sessions, NP   2 years ago Adjustment disorder with mixed anxiety and depressed mood   [place] [facility] Grayce Sessions, NP              Failed - Lipid Panel in normal range within the last 12 months    Cholesterol, Total  Date Value Ref Range Status  03/30/2020 157 100 - 199 mg/dL Final   Cholesterol  Date Value Ref Range Status  10/23/2022 158 <200 mg/dL Final   LDL Cholesterol (Calc)  Date Value Ref Range Status  10/23/2022 74 mg/dL (calc) Final    Comment:    Reference range: <100 . Desirable range <100 mg/dL for primary prevention;   <70 mg/dL for patients with CHD or diabetic patients  with > or = 2 CHD risk factors. Marland Kitchen LDL-C is now calculated using the Martin-Hopkins  calculation, which is a validated novel method providing  better accuracy than the  Friedewald equation in the  estimation of LDL-C.  Horald Pollen et al. Lenox Ahr. 4098;119(14): 2061-2068  (http://education.[facility].com/faq/FAQ164)    HDL  Date Value Ref Range Status  10/23/2022 63 > OR = 50 mg/dL Final  78/29/5621 47 >30 mg/dL Final   Triglycerides  Date Value Ref Range Status  10/23/2022 133 <150 mg/dL Final         Passed - Patient is not pregnant       levothyroxine (SYNTHROID) 50 MCG tablet      Sig: Take 1 tablet (50 mcg total) by mouth daily.     Endocrinology:  Hypothyroid Agents Failed - 12/29/2022 11:29 AM      Failed - Valid encounter within last 12 months    Recent Outpatient Visits           1 year ago Anxiety and depression   [facility] [place], Odette Horns, MD   1 year ago Gastroesophageal reflux disease without esophagitis   [place] [facility] Grayce Sessions, NP   2 years ago Encounter for screening mammogram for malignant neoplasm of breast   [place] [facility] Grayce Sessions, NP   2 years ago Adjustment disorder with mixed anxiety and depressed mood    [facility] Grayce Sessions, NP  2 years ago Adjustment disorder with mixed anxiety and depressed mood   [place] [facility] Grayce Sessions, NP              Passed - TSH in normal range and within 360 days    TSH  Date Value Ref Range Status  10/23/2022 1.25 0.40 - 4.50 mIU/L Final         Refused Prescriptions Disp Refills   celecoxib (CELEBREX) 200 MG capsule 60 capsule     Sig: Take 1 capsule (200 mg total) by mouth 2 (two) times daily between meals as needed.     Analgesics:  COX2 Inhibitors Failed - 12/29/2022 11:29 AM      Failed - Manual Review: Labs are only required if the patient has taken medication for more than 8 weeks.      Failed - HGB in normal range and within 360 days    Hemoglobin  Date Value Ref Range Status   11/27/2022 10.9 (L) 12.0 - 15.0 g/dL Final  16/03/9603 54.0 11.1 - 15.9 g/dL Final         Failed - Cr in normal range and within 360 days    Creat  Date Value Ref Range Status  10/23/2022 1.00 0.50 - 1.05 mg/dL Final   Creatinine, Ser  Date Value Ref Range Status  11/27/2022 1.14 (H) 0.44 - 1.00 mg/dL Final         Failed - Valid encounter within last 12 months    Recent Outpatient Visits           1 year ago Anxiety and depression   [facility] [place], Odette Horns, MD   1 year ago Gastroesophageal reflux disease without esophagitis   [place] [facility] Grayce Sessions, NP   2 years ago Encounter for screening mammogram for malignant neoplasm of breast   [place] [facility] Grayce Sessions, NP   2 years ago Adjustment disorder with mixed anxiety and depressed mood   [place] [facility] Grayce Sessions, NP   2 years ago Adjustment disorder with mixed anxiety and depressed mood   [place] [facility] Grayce Sessions, NP              Passed - HCT in normal range and within 360 days    HCT  Date Value Ref Range Status  11/27/2022 36.5 36.0 - 46.0 % Final   Hematocrit  Date Value Ref Range Status  01/01/2020 40.5 34.0 - 46.6 % Final         Passed - AST in normal range and within 360 days    AST  Date Value Ref Range Status  10/23/2022 12 10 - 35 U/L Final         Passed - ALT in normal range and within 360 days    ALT  Date Value Ref Range Status  10/23/2022 9 6 - 29 U/L Final         Passed - eGFR is 30 or above and within 360 days    GFR calc Af Amer  Date Value Ref Range Status  01/01/2020 60 >59 mL/min/1.73 Final    Comment:    **[facility] currently reports eGFR in compliance with the current**   recommendations of the [employer]. [facility] will   update reporting as new guidelines are published from the NKF-ASN    Task force.    GFR, Estimated  Date Value Ref Range Status  11/27/2022 54 (L) >60 mL/min Final    Comment:    (NOTE) Calculated using the CKD-EPI Creatinine Equation (2021)    eGFR  Date Value Ref Range Status  10/23/2022 63 > OR = 60 mL/min/1.64m2 Final         Passed - Patient is not pregnant

## 2022-12-29 NOTE — Telephone Encounter (Signed)
Prescription Request  12/29/2022  LOV: 11/27/2022  What is the name of the medication or equipment? pravastatin (PRAVACHOL) 40 MG tablet , celecoxib (CELEBREX) 200 MG capsule  and levothyroxine (SYNTHROID) 50 MCG tablet   Have you contacted your pharmacy to request a refill? Yes   Which pharmacy would you like this sent to?  [id number] - [place] - [address] [place] [address] Phone: [phone] Fax: [phone]    Patient notified that their request is being sent to the clinical staff for review and that they should receive a response within 2 business days.   Please advise at [facility] [phone]

## 2022-12-31 ENCOUNTER — Encounter: Payer: Self-pay | Admitting: Surgical

## 2022-12-31 NOTE — Progress Notes (Signed)
Post-Op Visit Note   Patient: Denise Morales           Date of Birth: 1961/05/20           MRN: [id number] Visit Date: 12/27/2022 PCP: Park Meo, FNP   Assessment & Plan:  Chief Complaint:  Chief Complaint  Patient presents with   Right Shoulder - Pain   Visit Diagnoses:  1. History of arthroplasty of right shoulder     Plan: Denise Morales is a 62 y.o. female who presents s/p right reverse shoulder arthroplasty on 12/05/2022.  Patient reports that she had a fall Saturday, falling onto her right side but avoiding a fall directly onto the shoulder..   No complaint of any instability symptoms.  Has had a lot of pain since the fall though she is functional.  Has not noticed any new swelling or ecchymosis.  Feels like the arm feels heavy at times.  Most of her pain is in the anterior aspect of the arm near the distal portion of the incision.  On exam, patient has range of motion 20 degrees X rotation, 85 degrees abduction, 110 degrees forward elevation passively.  Intact EPL, FPL, finger abduction, finger adduction, pronation/supination, bicep, tricep, deltoid of operative extremity.  Axillary nerve intact with deltoid firing.  Incision is healing well without evidence of infection or dehiscence.  .  2+ radial pulse of the operative extremity.  Patient does have some weakness of subscapularis rated 4/5 which may just be postoperative or could be from her fall.  She also has reproduction of pain with supination and bicep flexion.  There is no Popeye deformity noted on exam today.  Radiographs taken today demonstrate no significant change compared with prior radiographs of her right shoulder.  No evidence of hardware complication or instability.  No fracture.  She may have just exacerbated her postop pain or it could be injury to a soft tissue structures such as the bicep tenodesis to the pec tendon or the subscapularis repair.  Will plan to have her start physical therapy and see how  this goes.  Follow-up in 2 weeks for clinical recheck with Dr. August Saucer.   Follow-Up Instructions: No follow-ups on file.   Orders:  Orders Placed This Encounter  Procedures   XR Shoulder Right   No orders of the defined types were placed in this encounter.   Imaging: No results found.  PMFS History: Patient Active Problem List   Diagnosis Date Noted   Arthritis of right shoulder region 12/24/2022   Biceps tendonitis on right 12/24/2022   S/P reverse total shoulder arthroplasty, right 12/05/2022   Vertigo 11/27/2022   Iron deficiency anemia 10/30/2022   Physical exam, annual 10/30/2022   Medication management 10/30/2022   Class 2 severe obesity due to excess calories with serious comorbidity in adult, unspecified BMI (HCC) 10/23/2022   Essential hypertension 10/23/2022   Prediabetes 10/23/2022   Hypothyroidism 10/23/2022   Insomnia 06/14/2022   Status post total left knee replacement 07/15/2021   Neoplasm of uncertain behavior of skin 05/25/2021   Generalized osteoarthritis of multiple sites 05/25/2021   Fibromyalgia syndrome 05/25/2021   Restless legs 03/16/2021   Hyperlipidemia 03/16/2021   Anxiety 03/16/2021   Status post right knee replacement 12/24/2020   Unilateral primary osteoarthritis, left knee 11/11/2020   Unilateral primary osteoarthritis, right knee 11/11/2020   Complete tear of right rotator cuff 08/12/2020   Hypertensive disorder 05/05/2019   Past Medical History:  Diagnosis Date   Allergy  Anemia    Anxiety    Arthritis    Depression    Fibromyalgia    GERD (gastroesophageal reflux disease)    History of hiatal hernia    Hyperlipidemia    Hypertension    Hypothyroidism    Melanoma (HCC)    face   Thyroid disease     Family History  Problem Relation Age of Onset   Dementia Mother    Heart Problems Mother    Stroke Mother    Hypertension Mother    Hypertension Father    Colon polyps Father    Diabetes Father    Hypertension Sister     Diabetes Sister        borderline   Fibromyalgia Sister    Hypertension Sister    Colon polyps Sister    Diabetes Sister    Fibromyalgia Sister    Heart Problems Sister    Arthritis Sister    Hypertension Sister    Heart Problems Sister    Healthy Daughter    Diabetes Paternal Aunt    Colon cancer Neg Hx    Esophageal cancer Neg Hx    Rectal cancer Neg Hx    Stomach cancer Neg Hx    Breast cancer Neg Hx     Past Surgical History:  Procedure Laterality Date   ABDOMINAL HYSTERECTOMY  1985   AIKEN OSTEOTOMY Right 06/05/2022   Procedure: Quintella Reichert OSTEOTOMY;  Surgeon: Candelaria Stagers, DPM;  Location: [facility];  Service: Podiatry;  Laterality: Right;   BICEPT TENODESIS Right 12/05/2022   Procedure: RIGHT BICEPS TENODESIS;  Surgeon: Cammy Copa, MD;  Location: [facility] OR;  Service: Orthopedics;  Laterality: Right;   CAPSULOTOMY METATARSOPHALANGEAL Right 06/05/2022   Procedure: CAPSULOTOMY METATARSOPHALANGEAL;  Surgeon: Candelaria Stagers, DPM;  Location[facility];  Service: Podiatry;  Laterality: Right;   CHOLECYSTECTOMY     COLONOSCOPY     HALLUX VALGUS LAPIDUS Right 06/05/2022   Procedure: HALLUX VALGUS LAPIDUS;  Surgeon: Candelaria Stagers, DPM;  Location: [facility];  Service: Podiatry;  Laterality: Right;   HAMMER TOE SURGERY Right 06/05/2022   Procedure: HAMMER TOE CORRECTION;  Surgeon: Candelaria Stagers, DPM;  Location: [facility];  Service: Podiatry;  Laterality: Right;   REVERSE SHOULDER ARTHROPLASTY Right 12/05/2022   Procedure: RIGHT REVERSE SHOULDER ARTHROPLASTY;  Surgeon: Cammy Copa, MD;  Location: [facility] OR;  Service: Orthopedics;  Laterality: Right;   SHOULDER ARTHROSCOPY WITH ROTATOR CUFF REPAIR AND SUBACROMIAL DECOMPRESSION Right 08/12/2020   Procedure: RIGHT SHOULDER ARTHROSCOPY WITH ROTATOR CUFF REPAIR AND SUBACROMIAL DECOMPRESSION;  Surgeon: Kathryne Hitch, MD;  Location: [facility];   Service: Orthopedics;  Laterality: Right;   TOTAL KNEE ARTHROPLASTY Right 12/24/2020   Procedure: RIGHT TOTAL KNEE ARTHROPLASTY;  Surgeon: Kathryne Hitch, MD;  Location: [facility];  Service: Orthopedics;  Laterality: Right;   TOTAL KNEE ARTHROPLASTY Left 07/15/2021   Procedure: LEFT TOTAL KNEE ARTHROPLASTY;  Surgeon: Kathryne Hitch, MD;  Location: [facility];  Service: Orthopedics;  Laterality: Left;   Social History   Occupational History   Not on file  Tobacco Use   Smoking status: Never   Smokeless tobacco: Never  Vaping Use   Vaping Use: Never used  Substance and Sexual Activity   Alcohol use: Not Currently   Drug use: Never   Sexual activity: Not Currently

## 2023-01-01 ENCOUNTER — Encounter: Payer: Self-pay | Admitting: Family Medicine

## 2023-01-01 ENCOUNTER — Ambulatory Visit: Payer: Medicaid Other | Admitting: Family Medicine

## 2023-01-01 VITALS — BP 130/88 | HR 77 | Temp 97.9°F | Ht <= 58 in | Wt 166.0 lb

## 2023-01-01 DIAGNOSIS — D508 Other iron deficiency anemias: Secondary | ICD-10-CM | POA: Diagnosis not present

## 2023-01-01 DIAGNOSIS — M75121 Complete rotator cuff tear or rupture of right shoulder, not specified as traumatic: Secondary | ICD-10-CM

## 2023-01-01 DIAGNOSIS — E782 Mixed hyperlipidemia: Secondary | ICD-10-CM | POA: Diagnosis not present

## 2023-01-01 DIAGNOSIS — Z760 Encounter for issue of repeat prescription: Secondary | ICD-10-CM | POA: Diagnosis not present

## 2023-01-01 DIAGNOSIS — M797 Fibromyalgia: Secondary | ICD-10-CM

## 2023-01-01 DIAGNOSIS — E039 Hypothyroidism, unspecified: Secondary | ICD-10-CM

## 2023-01-01 LAB — CBC WITH DIFFERENTIAL/PLATELET
Absolute Monocytes: 490 cells/uL (ref 200–950)
Basophils Absolute: 79 cells/uL (ref 0–200)
Basophils Relative: 1 %
Eosinophils Absolute: 719 cells/uL — ABNORMAL HIGH (ref 15–500)
Eosinophils Relative: 9.1 %
HCT: 36.6 % (ref 35.0–45.0)
Hemoglobin: 11.1 g/dL — ABNORMAL LOW (ref 11.7–15.5)
Lymphs Abs: 2607 cells/uL (ref 850–3900)
MCH: 26 pg — ABNORMAL LOW (ref 27.0–33.0)
MCHC: 30.3 g/dL — ABNORMAL LOW (ref 32.0–36.0)
MCV: 85.7 fL (ref 80.0–100.0)
MPV: 9.8 fL (ref 7.5–12.5)
Monocytes Relative: 6.2 %
Neutro Abs: 4005 cells/uL (ref 1500–7800)
Neutrophils Relative %: 50.7 %
Platelets: 389 10*3/uL (ref 140–400)
RBC: 4.27 10*6/uL (ref 3.80–5.10)
RDW: 18.7 % — ABNORMAL HIGH (ref 11.0–15.0)
Total Lymphocyte: 33 %
WBC: 7.9 10*3/uL (ref 3.8–10.8)

## 2023-01-01 MED ORDER — LEVOTHYROXINE SODIUM 50 MCG PO TABS
50.0000 ug | ORAL_TABLET | Freq: Every day | ORAL | 3 refills | Status: DC
Start: 2023-01-01 — End: 2024-03-03

## 2023-01-01 MED ORDER — CELECOXIB 200 MG PO CAPS
200.0000 mg | ORAL_CAPSULE | Freq: Two times a day (BID) | ORAL | 3 refills | Status: DC | PRN
Start: 2023-01-01 — End: 2023-04-16

## 2023-01-01 MED ORDER — PRAVASTATIN SODIUM 40 MG PO TABS
40.0000 mg | ORAL_TABLET | Freq: Every day | ORAL | 1 refills | Status: DC
Start: 2023-01-01 — End: 2023-09-10

## 2023-01-01 MED ORDER — PREGABALIN 75 MG PO CAPS
75.0000 mg | ORAL_CAPSULE | Freq: Two times a day (BID) | ORAL | 0 refills | Status: DC
Start: 2023-01-01 — End: 2023-03-06

## 2023-01-01 NOTE — Progress Notes (Signed)
Subjective:  HPI: Denise Morales is a 62 y.o. female presenting on 01/01/2023 for Follow-up (F/u on labs/bp)   HPI Patient is in today for follow-up. She will have CBC today to assess her anemia after being on Ferrous Sulfate, has colonscopy and endoscopy next week. She did have her right shoulder surgery and that went well, she did fall on her right shoulder and has been seen at ortho since, no fracture. Has not yet started PT. Incision healing well without complications. She is doing well off of her Naproxen and Allopurinol. Her dizziness is also improved. She does report ongoing generalized aching pains due to her fibromyalgia. Is open to trying a different medication for this. Currently taking Tylenol and Celebrex.  Review of Systems  All other systems reviewed and are negative.   Relevant past medical history reviewed and updated as indicated.   Past Medical History:  Diagnosis Date   Allergy    Anemia    Anxiety    Arthritis    Depression    Fibromyalgia    GERD (gastroesophageal reflux disease)    History of hiatal hernia    Hyperlipidemia    Hypertension    Hypothyroidism    Melanoma (HCC)    face   Thyroid disease      Past Surgical History:  Procedure Laterality Date   ABDOMINAL HYSTERECTOMY  1985   AIKEN OSTEOTOMY Right 06/05/2022   Procedure: Quintella Reichert OSTEOTOMY;  Surgeon: Candelaria Stagers, DPM;  Location: [facility] [place];  Service: Podiatry;  Laterality: Right;   BICEPT TENODESIS Right 12/05/2022   Procedure: RIGHT BICEPS TENODESIS;  Surgeon: Cammy Copa, MD;  Location: [facility] OR;  Service: Orthopedics;  Laterality: Right;   CAPSULOTOMY METATARSOPHALANGEAL Right 06/05/2022   Procedure: CAPSULOTOMY METATARSOPHALANGEAL;  Surgeon: Candelaria Stagers, DPM;  Location: [facility];  Service: Podiatry;  Laterality: Right;   CHOLECYSTECTOMY     COLONOSCOPY     HALLUX VALGUS LAPIDUS Right 06/05/2022   Procedure: HALLUX VALGUS LAPIDUS;  Surgeon:  Candelaria Stagers, DPM;  Location: [facility];  Service: Podiatry;  Laterality: Right;   HAMMER TOE SURGERY Right 06/05/2022   Procedure: HAMMER TOE CORRECTION;  Surgeon: Candelaria Stagers, DPM;  Location: [facility];  Service: Podiatry;  Laterality: Right;   REVERSE SHOULDER ARTHROPLASTY Right 12/05/2022   Procedure: RIGHT REVERSE SHOULDER ARTHROPLASTY;  Surgeon: Cammy Copa, MD;  Location: [facility] OR;  Service: Orthopedics;  Laterality: Right;   SHOULDER ARTHROSCOPY WITH ROTATOR CUFF REPAIR AND SUBACROMIAL DECOMPRESSION Right 08/12/2020   Procedure: RIGHT SHOULDER ARTHROSCOPY WITH ROTATOR CUFF REPAIR AND SUBACROMIAL DECOMPRESSION;  Surgeon: Kathryne Hitch, MD;  Location: [facility];  Service: Orthopedics;  Laterality: Right;   TOTAL KNEE ARTHROPLASTY Right 12/24/2020   Procedure: RIGHT TOTAL KNEE ARTHROPLASTY;  Surgeon: Kathryne Hitch, MD;  Location: [facility];  Service: Orthopedics;  Laterality: Right;   TOTAL KNEE ARTHROPLASTY Left 07/15/2021   Procedure: LEFT TOTAL KNEE ARTHROPLASTY;  Surgeon: Kathryne Hitch, MD;  Location: [facility];  Service: Orthopedics;  Laterality: Left;    Allergies and medications reviewed and updated.   Current Outpatient Medications:    acetaminophen (TYLENOL) 500 MG tablet, Take 1,000 mg by mouth at bedtime., Disp: , Rfl:    aspirin EC 81 MG tablet, Take 1 tablet (81 mg total) by mouth daily. Swallow whole., Disp: 30 tablet, Rfl: 0   busPIRone (BUSPAR) 10 MG tablet, Take 10 mg by mouth daily as needed (  Anxiety)., Disp: , Rfl:    CLENPIQ 10-3.5-12 MG-GM -GM/175ML SOLN, take 1 kit by mouth as directed, Disp: 350 mL, Rfl: 0   cyclobenzaprine (FLEXERIL) 5 MG tablet, Take 5 mg by mouth 3 (three) times daily as needed for muscle spasms., Disp: , Rfl:    dicyclomine (BENTYL) 10 MG capsule, Take 10 mg by mouth 3 (three) times daily before meals., Disp: , Rfl:    diphenhydrAMINE (BENADRYL) 25 mg capsule, Take  25 mg by mouth daily., Disp: , Rfl:    docusate sodium (COLACE) 100 MG capsule, Take 1 capsule (100 mg total) by mouth 2 (two) times daily., Disp: 10 capsule, Rfl: 0   escitalopram (LEXAPRO) 20 MG tablet, Take 1 tablet (20 mg total) by mouth daily., Disp: 90 tablet, Rfl: 1   famotidine (PEPCID) 40 MG tablet, Take 1 tablet (40 mg total) by mouth at bedtime., Disp: 30 tablet, Rfl: 8   Iron, Ferrous Sulfate, 325 (65 Fe) MG TABS, Take 325 mg by mouth daily., Disp: 90 tablet, Rfl: 1   Krill Oil 500 MG CAPS, Take 500 mg by mouth in the morning., Disp: , Rfl:    lisinopril-hydrochlorothiazide (ZESTORETIC) 10-12.5 MG tablet, Take 1 tablet by mouth daily., Disp: 90 tablet, Rfl: 1   meclizine (ANTIVERT) 12.5 MG tablet, Take 1 tablet (12.5 mg total) by mouth 3 (three) times daily as needed for dizziness., Disp: 30 tablet, Rfl: 0   ondansetron (ZOFRAN-ODT) 8 MG disintegrating tablet, Take 8 mg by mouth every 8 (eight) hours as needed for nausea or vomiting., Disp: , Rfl:    oxyCODONE (OXY IR/ROXICODONE) 5 MG immediate release tablet, Take 1 tablet (5 mg total) by mouth every 4 (four) hours as needed for moderate pain (pain score 4-6)., Disp: 30 tablet, Rfl: 0   pantoprazole (PROTONIX) 40 MG tablet, TAKE 1 TABLET BY MOUTH TWICE DAILY BEFORE A MEAL, Disp: 60 tablet, Rfl: 5   pregabalin (LYRICA) 75 MG capsule, Take 1 capsule (75 mg total) by mouth 2 (two) times daily., Disp: 120 capsule, Rfl: 0   QUEtiapine (SEROQUEL) 50 MG tablet, Take 1 tablet (50 mg total) by mouth at bedtime. (Patient taking differently: Take 50 mg by mouth See admin instructions. Every other night alternate with [place]), Disp: 30 tablet, Rfl: 3   sucralfate (CARAFATE) 1 g tablet, TAKE 1 TABLET BY MOUTH THREE TIMES DAILY AS NEEDED FOR HEART BURN, Disp: 90 tablet, Rfl: 1   TURMERIC PO, Take 1,000 mg by mouth daily., Disp: , Rfl:    Vitamin D-Vitamin K (VITAMIN K2-VITAMIN D3 PO), Take 1 tablet by mouth daily. 100 mg/50 mcg, Disp: , Rfl:     zolpidem (AMBIEN) 5 MG tablet, Take 1 tablet (5 mg total) by mouth See admin instructions. Every other night alternate with Quetiapine, Disp: 15 tablet, Rfl: 0   celecoxib (CELEBREX) 200 MG capsule, Take 1 capsule (200 mg total) by mouth 2 (two) times daily between meals as needed., Disp: 60 capsule, Rfl: 3   levothyroxine (SYNTHROID) 50 MCG tablet, Take 1 tablet (50 mcg total) by mouth daily., Disp: 90 tablet, Rfl: 3   pravastatin (PRAVACHOL) 40 MG tablet, Take 1 tablet (40 mg total) by mouth daily., Disp: 90 tablet, Rfl: 1  Allergies  Allergen Reactions   Hydroxyzine     Other reaction(s): Hallucinations    Objective:   BP 130/88   Pulse 77   Temp 97.9 F (36.6 C) (Oral)   Ht 4\' 10"  (1.473 m)   Wt 166 lb (75.3 kg)  SpO2 96%   BMI 34.69 kg/m      01/01/2023    9:09 AM 12/06/2022    7:38 AM 12/06/2022    5:08 AM  Vitals with BMI  Height 4\' 10"     Weight 166 lbs    BMI 34.7    Systolic 130 118 161  Diastolic 88 65 69  Pulse 77 75 84     Physical Exam Vitals and nursing note reviewed.  Constitutional:      Appearance: Normal appearance. She is normal weight.  HENT:     Head: Normocephalic and atraumatic.  Cardiovascular:     Rate and Rhythm: Normal rate and regular rhythm.     Pulses: Normal pulses.     Heart sounds: Normal heart sounds.  Pulmonary:     Effort: Pulmonary effort is normal.     Breath sounds: Normal breath sounds.  Skin:    General: Skin is warm and dry.  Neurological:     General: No focal deficit present.     Mental Status: She is alert and oriented to person, place, and time. Mental status is at baseline.  Psychiatric:        Mood and Affect: Mood normal.        Behavior: Behavior normal.        Thought Content: Thought content normal.        Judgment: Judgment normal.     Assessment & Plan:  Fibromyalgia syndrome Assessment & Plan: Start Lyrica 75mg  BID.   Orders: -     Pregabalin; Take 1 capsule (75 mg total) by mouth 2 (two) times  daily.  Dispense: 120 capsule; Refill: 0  Other iron deficiency anemia Assessment & Plan: CBC today.  Orders: -     CBC with Differential/Platelet  Mixed hyperlipidemia Assessment & Plan: Refilled statin today. Continue heart healthy diet and 150 minutes exercise as tolerated.   Orders: -     Pravastatin Sodium; Take 1 tablet (40 mg total) by mouth daily.  Dispense: 90 tablet; Refill: 1  Medication refill  Hypothyroidism, unspecified type Assessment & Plan: Synthroid refill provided today. Continue daily. Last TSH normal.  Orders: -     Levothyroxine Sodium; Take 1 tablet (50 mcg total) by mouth daily.  Dispense: 90 tablet; Refill: 3  Nontraumatic complete tear of right rotator cuff Assessment & Plan: Incision C/D/I, refill Celecoxib today.  Orders: -     Celecoxib; Take 1 capsule (200 mg total) by mouth 2 (two) times daily between meals as needed.  Dispense: 60 capsule; Refill: 3     Follow up plan: Return in about 3 months (around 04/03/2023) for chronic follow-up with labs 1 week prior.  Park Meo, FNP

## 2023-01-01 NOTE — Assessment & Plan Note (Signed)
Start Lyrica 75mg  BID.

## 2023-01-01 NOTE — Assessment & Plan Note (Signed)
CBC today.  

## 2023-01-01 NOTE — Assessment & Plan Note (Signed)
Synthroid refill provided today. Continue daily. Last TSH normal.

## 2023-01-01 NOTE — Assessment & Plan Note (Signed)
Incision C/D/I, refill Celecoxib today.

## 2023-01-01 NOTE — Assessment & Plan Note (Signed)
Refilled statin today. Continue heart healthy diet and 150 minutes exercise as tolerated.

## 2023-01-04 ENCOUNTER — Other Ambulatory Visit: Payer: Self-pay

## 2023-01-04 ENCOUNTER — Ambulatory Visit: Payer: Medicaid Other | Attending: Orthopedic Surgery

## 2023-01-04 DIAGNOSIS — G8929 Other chronic pain: Secondary | ICD-10-CM | POA: Insufficient documentation

## 2023-01-04 DIAGNOSIS — M25511 Pain in right shoulder: Secondary | ICD-10-CM | POA: Diagnosis present

## 2023-01-04 DIAGNOSIS — M6281 Muscle weakness (generalized): Secondary | ICD-10-CM | POA: Insufficient documentation

## 2023-01-04 DIAGNOSIS — Z96611 Presence of right artificial shoulder joint: Secondary | ICD-10-CM | POA: Insufficient documentation

## 2023-01-04 NOTE — Therapy (Signed)
OUTPATIENT PHYSICAL THERAPY EVALUATION   Patient Name: Denise Morales MRN: [id number] DOB:1960/08/10, 62 y.o., female Today's Date: 01/04/2023  END OF SESSION:  PT End of Session - 01/04/23 0936     Visit Number 1    Number of Visits 17    Date for PT Re-Evaluation 03/01/23    Authorization Type [employer]    PT Start Time [phone]    PT Stop Time 1000    PT Time Calculation (min) 43 min    Activity Tolerance Patient tolerated treatment well;Patient limited by pain    Behavior During Therapy [facility] for tasks assessed/performed             Past Medical History:  Diagnosis Date   Allergy    Anemia    Anxiety    Arthritis    Depression    Fibromyalgia    GERD (gastroesophageal reflux disease)    History of hiatal hernia    Hyperlipidemia    Hypertension    Hypothyroidism    Melanoma (HCC)    face   Thyroid disease    Past Surgical History:  Procedure Laterality Date   ABDOMINAL HYSTERECTOMY  1985   AIKEN OSTEOTOMY Right 06/05/2022   Procedure: Quintella Reichert OSTEOTOMY;  Surgeon: Candelaria Stagers, DPM;  Location: [facility];  Service: Podiatry;  Laterality: Right;   BICEPT TENODESIS Right 12/05/2022   Procedure: RIGHT BICEPS TENODESIS;  Surgeon: Cammy Copa, MD;  Location: [facility];  Service: Orthopedics;  Laterality: Right;   CAPSULOTOMY METATARSOPHALANGEAL Right 06/05/2022   Procedure: CAPSULOTOMY METATARSOPHALANGEAL;  Surgeon: Candelaria Stagers, DPM;  Location: [facility];  Service: Podiatry;  Laterality: Right;   CHOLECYSTECTOMY     COLONOSCOPY     HALLUX VALGUS LAPIDUS Right 06/05/2022   Procedure: HALLUX VALGUS LAPIDUS;  Surgeon: Candelaria Stagers, DPM;  Location: [facility];  Service: Podiatry;  Laterality: Right;   HAMMER TOE SURGERY Right 06/05/2022   Procedure: HAMMER TOE CORRECTION;  Surgeon: Candelaria Stagers, DPM;  Location: [facility];  Service: Podiatry;  Laterality: Right;   REVERSE SHOULDER  ARTHROPLASTY Right 12/05/2022   Procedure: RIGHT REVERSE SHOULDER ARTHROPLASTY;  Surgeon: Cammy Copa, MD;  Location: [facility];  Service: Orthopedics;  Laterality: Right;   SHOULDER ARTHROSCOPY WITH ROTATOR CUFF REPAIR AND SUBACROMIAL DECOMPRESSION Right 08/12/2020   Procedure: RIGHT SHOULDER ARTHROSCOPY WITH ROTATOR CUFF REPAIR AND SUBACROMIAL DECOMPRESSION;  Surgeon: Kathryne Hitch, MD;  Location: [facility];  Service: Orthopedics;  Laterality: Right;   TOTAL KNEE ARTHROPLASTY Right 12/24/2020   Procedure: RIGHT TOTAL KNEE ARTHROPLASTY;  Surgeon: Kathryne Hitch, MD;  Location: [facility];  Service: Orthopedics;  Laterality: Right;   TOTAL KNEE ARTHROPLASTY Left 07/15/2021   Procedure: LEFT TOTAL KNEE ARTHROPLASTY;  Surgeon: Kathryne Hitch, MD;  Location: [facility];  Service: Orthopedics;  Laterality: Left;   Patient Active Problem List   Diagnosis Date Noted   Arthritis of right shoulder region 12/24/2022   Biceps tendonitis on right 12/24/2022   S/P reverse total shoulder arthroplasty, right 12/05/2022   Vertigo 11/27/2022   Iron deficiency anemia 10/30/2022   Physical exam, annual 10/30/2022   Medication management 10/30/2022   Class 2 severe obesity due to excess calories with serious comorbidity in adult, unspecified BMI (HCC) 10/23/2022   Essential hypertension 10/23/2022   Prediabetes 10/23/2022   Hypothyroidism 10/23/2022   Insomnia 06/14/2022   Status post total left knee replacement 07/15/2021   Neoplasm of  uncertain behavior of skin 05/25/2021   Generalized osteoarthritis of multiple sites 05/25/2021   Fibromyalgia syndrome 05/25/2021   Restless legs 03/16/2021   Hyperlipidemia 03/16/2021   Anxiety 03/16/2021   Status post right knee replacement 12/24/2020   Unilateral primary osteoarthritis, left knee 11/11/2020   Unilateral primary osteoarthritis, right knee 11/11/2020   Complete tear of right rotator cuff 08/12/2020   Hypertensive  disorder 05/05/2019   PCP: Park Meo, FNP  REFERRING PROVIDER: Cammy Copa, MD  REFERRING DIAG: History of arthroplasty of right shoulder [Z96.611]   THERAPY DIAG:  Chronic right shoulder pain  Muscle weakness (generalized)  Rationale for Evaluation and Treatment: Rehabilitation  ONSET DATE: 12/05/22 Rt RSA  SUBJECTIVE:                                                                                                                                                                                                         SUBJECTIVE STATEMENT:  Patient reports that her shoulder is feeling heavy and painful, but she has been doing CPM machine at home from the beginning. She has been able to return to doing laundry, don/doff bra strap with modified technique. She has difficulty with buckling her seat belt and asks for help if able.   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 The Rt RTC was worse and underwent surgery, and plans to look at Lt side once the right is healed.     Hand dominance: Right  PERTINENT HISTORY:  PMHx includes anemia, anxiety/depression, fibromyalgia, HTN, hypothyroidism, vertigo  PAIN:  Are you having pain? No  PRECAUTIONS: None  RED FLAGS: None    WEIGHT BEARING RESTRICTIONS: No  FALLS:  Has patient fallen in last 6 months? Yes. Number of falls once on 12/23/22 unto Rt shoulder. "The doctor said that I may have a small tear that should heal itself."  LIVING ENVIRONMENT: Lives with: lives with their family Lives in: House/apartment Stairs: Yes: External: 5 steps; can reach both Has following equipment at home: None  OCCUPATION: n/a  PLOF: Independent  PATIENT GOALS: Patient would like to return to PLOF with decreased pain and improved strength.   NEXT MD VISIT: 01/08/23   OBJECTIVE:   DIAGNOSTIC FINDINGS:   12/31/22 XR Shoulder Right   AP, Scap Y, axillary views  right shoulder reviewed.  Right shoulder reverse shoulder prosthesis in good position alignment without any complicating features.  No change compared with prior radiographs from 1 to 2 weeks ago.  No  fracture/dislocation.    PATIENT SURVEYS:  FOTO 40 current, 55 predicted  COGNITION: Overall cognitive status: Within functional limits for tasks assessed  SENSATION: WFL  POSTURE: rounded shoulders  PALPATION: Tenderness to palpation along lateral shoulder related to recent fall on 12/23/22. No significant post-op related tenderness to palpation.     UPPER EXTREMITY ROM:  Active ROM Right eval Left eval  Shoulder flexion 140   Shoulder extension    Shoulder abduction 80   Shoulder adduction    Shoulder extension    Shoulder internal rotation 72   Shoulder external rotation 35   Elbow flexion    Elbow extension    Wrist flexion    Wrist extension    Wrist ulnar deviation    Wrist radial deviation    Wrist pronation    Wrist supination     (Blank rows = not tested)  R shoulder abduction improved by 10deg following manual PT today.   UPPER EXTREMITY MMT: not formally assess at eval d/t ROM deficits and post-op status  MMT Right eval Left eval  Shoulder flexion    Shoulder extension    Shoulder abduction    Shoulder adduction    Shoulder extension    Shoulder internal rotation    Shoulder external rotation    Middle trapezius    Lower trapezius    Elbow flexion    Elbow extension    Wrist flexion    Wrist extension    Wrist ulnar deviation    Wrist radial deviation    Wrist pronation    Wrist supination    Grip strength     (Blank rows = not tested)   TODAY'S TREATMENT:                                                                                                                               [facility] Adult PT Treatment:                                                DATE: 01/04/2023  Therapeutic Exercise: Seated pendulum  Created and reviewed initial  HEP as noted below   Manual Therapy: PROM shoulder flexion, scaption, abduction   PATIENT EDUCATION:  Education details: reviewed initial home exercise program; discussion of POC, prognosis and goals for skilled PT   Person educated: Patient Education method: Explanation, Demonstration, and Handouts Education comprehension: verbalized understanding, returned demonstration, and needs further education  HOME EXERCISE PROGRAM: Access Code: [email or web address] URL: [email or web address] Date: 01/04/2023 Prepared by: Mauri Reading  Exercises - Seated Shoulder Flexion Towel Slide at Table Top  - 2 x daily - 7 x weekly - 2 sets - 10 reps - 3 sec hold - Seated Shoulder Abduction Towel Slide at Table Top  - 2 x daily - 7 x weekly - 2 sets -  10 reps - 3 sec hold - Seated Shoulder Pendulum Exercise  - 2 x daily - 7 x weekly - 2 minutes hold - Horizontal Shoulder Pendulum with Table Support  - 2 x daily - 7 x weekly - 2 min hold  ASSESSMENT:  CLINICAL IMPRESSION: Patient is a 62 y.o. female who was seen today for physical therapy evaluation and treatment for Rt shoulder pain and stiffness, 4 weeks s/p reverse total shoulder arthroplasty. She is demonstrating decreased R shoulder AROM, diminished R shoulder strength related to post op status. She has related pain and difficulty with ADLs/IADLs, driving, sleep quality, and participation in normal daily activities. She requires skilled PT services at this time to address all relevant deficits and return to PLOF.   OBJECTIVE IMPAIRMENTS: decreased activity tolerance, decreased endurance, decreased ROM, decreased strength, impaired UE functional use, postural dysfunction, and pain.   ACTIVITY LIMITATIONS: carrying, lifting, sleeping, bathing, dressing, reach over head, and hygiene/grooming  PARTICIPATION LIMITATIONS: meal prep, cleaning, driving, shopping, community activity, and yard work  PERSONAL FACTORS: Past/current experiences and 1-2  comorbidities: PMHx includes anemia, anxiety/depression, fibromyalgia, HTN, hypothyroidism, vertigo  are also affecting patient's functional outcome.   REHAB POTENTIAL: Good  CLINICAL DECISION MAKING: Evolving/moderate complexity  EVALUATION COMPLEXITY: Moderate   GOALS: Goals reviewed with patient? Yes  SHORT TERM GOALS: Target date: 02/01/2023    Patient will be independent with initial home program for Rt shoulder ROM.  Baseline: provided at eval Goal status: INITIAL  2.  Patient will demonstrate improved AROM by 10 degrees in shoulder flexion/abduction.  Baseline: see objective findings Goal status: INITIAL    LONG TERM GOALS: Target date: 03/01/2023    Patient will report improved overall functional ability with FOTO score of 55 or greater.  Baseline: 40 Goal status: INITIAL  2.  Patient will report 2/10 or less with ADLs including don/doff bra and washing her back.  Baseline: difficulty related to pain and limited AROM  Goal status: INITIAL  3.  Patient will demonstrate at least 4/5 R UE strength in all directions Baseline: see objective findings  Goal status: INITIAL  4.  Patient will report ability to return to driving 20 minutes or greater without exacerbation of pain. Baseline: limited to short distance and unable to use R arm as normal when turning.  Goal status: INITIAL  5.  Patient will report improved sleep quality without waking d/t R shoulder pain.  Baseline: walking d/t BIL shoulder pain throughout the night Goal status: INITIAL  6.  Patient will demonstrate ability to push/pull at least 40# with <3/10 pain in order to return to heavy household and yardwork tasks.  Baseline: unable to sweep/vacuum/push the lawnmower Goal status: INITIAL   PLAN:  PT FREQUENCY: 2x/week  PT DURATION: 8 weeks  PLANNED INTERVENTIONS: Therapeutic exercises, Therapeutic activity, Neuromuscular re-education, Patient/Family education, Self Care, Joint mobilization,  Electrical stimulation, Cryotherapy, Moist heat, Manual therapy, and Re-evaluation  PLAN FOR NEXT SESSION: PROM, AAROM->AROM progression per post op status, manual therapy and pain modulation activities as needed.    Mauri Reading, PT, DPT 01/04/2023, 10:20 AM

## 2023-01-05 ENCOUNTER — Telehealth: Payer: Self-pay | Admitting: Physician Assistant

## 2023-01-05 NOTE — Telephone Encounter (Signed)
PT was calling to get Clenpiq script sent to pharmacy. It was sent in May but [facility] has no script on file. Please send to [facility], [place]

## 2023-01-08 ENCOUNTER — Ambulatory Visit (INDEPENDENT_AMBULATORY_CARE_PROVIDER_SITE_OTHER): Payer: Medicaid Other | Admitting: Surgical

## 2023-01-08 ENCOUNTER — Encounter: Payer: Self-pay | Admitting: Surgical

## 2023-01-08 ENCOUNTER — Other Ambulatory Visit: Payer: Self-pay | Admitting: Gastroenterology

## 2023-01-08 DIAGNOSIS — K2100 Gastro-esophageal reflux disease with esophagitis, without bleeding: Secondary | ICD-10-CM

## 2023-01-08 DIAGNOSIS — Z96611 Presence of right artificial shoulder joint: Secondary | ICD-10-CM

## 2023-01-08 DIAGNOSIS — K219 Gastro-esophageal reflux disease without esophagitis: Secondary | ICD-10-CM

## 2023-01-08 MED ORDER — CLENPIQ 10-3.5-12 MG-GM -GM/175ML PO SOLN: 1.0000 | ORAL | 0 refills | Status: DC

## 2023-01-08 NOTE — Telephone Encounter (Signed)
Patient aware that Clenpiq was resent to [facility] at [facility].

## 2023-01-08 NOTE — Progress Notes (Signed)
Post-Op Visit Note   Patient: Denise Morales           Date of Birth: Mar 28, 1961           MRN: [id number] Visit Date: 01/08/2023 PCP: Park Meo, FNP   Assessment & Plan:  Chief Complaint: No chief complaint on file.  Visit Diagnoses:  1. History of arthroplasty of right shoulder     Plan: Patient is a 62 year old female who returns for reevaluation s/p right shoulder reverse shoulder arthroplasty.  She is a little less than 2 weeks out from her last visit and a little over a month out from her surgery.  She was last seen after a fall postoperatively.  She states that the acute pain from the fall has significantly improved and she is a lot more comfortable.  She is feeling a lot more independent.  She can get her bra on now.  She can lift her arm overhead with a little bit of fatigue but pain is a lot better.  No fevers or chills.  She is sleeping in her recliner to limit the nighttime pain that she has from tossing and turning.  On exam, patient has 40 degrees X rotation, 80 degrees abduction, 130 degrees forward elevation passively and actively.  She has 5 -/5 subscapularis strength on exam today.  Excellent supination and bicep flexion strength.  Incision is well-healed.  No evidence of infection or dehiscence.  Axillary nerve intact with deltoid firing.  2+ radial pulse of the operative extremity.  Intact EPL, FPL, finger abduction.  Plan is to continue with physical therapy; she has only had 1 session of PT thus far which was her evaluation.  She will start physical therapy in earnest in about a week and a half after she gets back from the beach with her [place].  Follow-up in 6 weeks for clinical recheck with Dr. August Saucer.  Follow-Up Instructions: No follow-ups on file.   Orders:  No orders of the defined types were placed in this encounter.  No orders of the defined types were placed in this encounter.   Imaging: No results found.  PMFS History: Patient Active Problem List    Diagnosis Date Noted   Arthritis of right shoulder region 12/24/2022   Biceps tendonitis on right 12/24/2022   S/P reverse total shoulder arthroplasty, right 12/05/2022   Vertigo 11/27/2022   Iron deficiency anemia 10/30/2022   Physical exam, annual 10/30/2022   Medication management 10/30/2022   Class 2 severe obesity due to excess calories with serious comorbidity in adult, unspecified BMI (HCC) 10/23/2022   Essential hypertension 10/23/2022   Prediabetes 10/23/2022   Hypothyroidism 10/23/2022   Insomnia 06/14/2022   Status post total left knee replacement 07/15/2021   Neoplasm of uncertain behavior of skin 05/25/2021   Generalized osteoarthritis of multiple sites 05/25/2021   Fibromyalgia syndrome 05/25/2021   Restless legs 03/16/2021   Hyperlipidemia 03/16/2021   Anxiety 03/16/2021   Status post right knee replacement 12/24/2020   Unilateral primary osteoarthritis, left knee 11/11/2020   Unilateral primary osteoarthritis, right knee 11/11/2020   Complete tear of right rotator cuff 08/12/2020   Hypertensive disorder 05/05/2019   Past Medical History:  Diagnosis Date   Allergy    Anemia    Anxiety    Arthritis    Depression    Fibromyalgia    GERD (gastroesophageal reflux disease)    History of hiatal hernia    Hyperlipidemia    Hypertension    Hypothyroidism  Melanoma (HCC)    face   Thyroid disease     Family History  Problem Relation Age of Onset   Dementia Mother    Heart Problems Mother    Stroke Mother    Hypertension Mother    Hypertension Father    Colon polyps Father    Diabetes Father    Hypertension Sister    Diabetes Sister        borderline   Fibromyalgia Sister    Hypertension Sister    Colon polyps Sister    Diabetes Sister    Fibromyalgia Sister    Heart Problems Sister    Arthritis Sister    Hypertension Sister    Heart Problems Sister    Healthy Daughter    Diabetes Paternal Aunt    Colon cancer Neg Hx    Esophageal cancer  Neg Hx    Rectal cancer Neg Hx    Stomach cancer Neg Hx    Breast cancer Neg Hx     Past Surgical History:  Procedure Laterality Date   ABDOMINAL HYSTERECTOMY  1985   AIKEN OSTEOTOMY Right 06/05/2022   Procedure: Quintella Reichert OSTEOTOMY;  Surgeon: Candelaria Stagers, DPM;  Location: [facility];  Service: Podiatry;  Laterality: Right;   BICEPT TENODESIS Right 12/05/2022   Procedure: RIGHT BICEPS TENODESIS;  Surgeon: Cammy Copa, MD;  Location: [facility];  Service: Orthopedics;  Laterality: Right;   CAPSULOTOMY METATARSOPHALANGEAL Right 06/05/2022   Procedure: CAPSULOTOMY METATARSOPHALANGEAL;  Surgeon: Candelaria Stagers, DPM;  Location: [facility];  Service: Podiatry;  Laterality: Right;   CHOLECYSTECTOMY     COLONOSCOPY     HALLUX VALGUS LAPIDUS Right 06/05/2022   Procedure: HALLUX VALGUS LAPIDUS;  Surgeon: Candelaria Stagers, DPM;  Location: [facility];  Service: Podiatry;  Laterality: Right;   HAMMER TOE SURGERY Right 06/05/2022   Procedure: HAMMER TOE CORRECTION;  Surgeon: Candelaria Stagers, DPM;  Location: [facility];  Service: Podiatry;  Laterality: Right;   REVERSE SHOULDER ARTHROPLASTY Right 12/05/2022   Procedure: RIGHT REVERSE SHOULDER ARTHROPLASTY;  Surgeon: Cammy Copa, MD;  Location: [facility];  Service: Orthopedics;  Laterality: Right;   SHOULDER ARTHROSCOPY WITH ROTATOR CUFF REPAIR AND SUBACROMIAL DECOMPRESSION Right 08/12/2020   Procedure: RIGHT SHOULDER ARTHROSCOPY WITH ROTATOR CUFF REPAIR AND SUBACROMIAL DECOMPRESSION;  Surgeon: Kathryne Hitch, MD;  Location: [facility];  Service: Orthopedics;  Laterality: Right;   TOTAL KNEE ARTHROPLASTY Right 12/24/2020   Procedure: RIGHT TOTAL KNEE ARTHROPLASTY;  Surgeon: Kathryne Hitch, MD;  Location: [facility];  Service: Orthopedics;  Laterality: Right;   TOTAL KNEE ARTHROPLASTY Left 07/15/2021   Procedure: LEFT TOTAL KNEE ARTHROPLASTY;  Surgeon: Kathryne Hitch, MD;  Location: [facility];  Service: Orthopedics;  Laterality: Left;   Social History   Occupational History   Not on file  Tobacco Use   Smoking status: Never   Smokeless tobacco: Never  Vaping Use   Vaping status: Never Used  Substance and Sexual Activity   Alcohol use: Not Currently   Drug use: Never   Sexual activity: Not Currently

## 2023-01-09 ENCOUNTER — Ambulatory Visit (AMBULATORY_SURGERY_CENTER): Payer: Medicaid Other | Admitting: Gastroenterology

## 2023-01-09 ENCOUNTER — Encounter: Payer: Self-pay | Admitting: Gastroenterology

## 2023-01-09 ENCOUNTER — Telehealth: Payer: Self-pay | Admitting: *Deleted

## 2023-01-09 VITALS — BP 123/80 | HR 66 | Temp 98.4°F | Resp 13 | Ht <= 58 in | Wt 166.0 lb

## 2023-01-09 DIAGNOSIS — K449 Diaphragmatic hernia without obstruction or gangrene: Secondary | ICD-10-CM | POA: Diagnosis not present

## 2023-01-09 DIAGNOSIS — K6389 Other specified diseases of intestine: Secondary | ICD-10-CM | POA: Diagnosis not present

## 2023-01-09 DIAGNOSIS — K219 Gastro-esophageal reflux disease without esophagitis: Secondary | ICD-10-CM

## 2023-01-09 DIAGNOSIS — D509 Iron deficiency anemia, unspecified: Secondary | ICD-10-CM

## 2023-01-09 DIAGNOSIS — K635 Polyp of colon: Secondary | ICD-10-CM

## 2023-01-09 DIAGNOSIS — K222 Esophageal obstruction: Secondary | ICD-10-CM

## 2023-01-09 MED ORDER — SODIUM CHLORIDE 0.9 % IV SOLN
500.0000 mL | Freq: Once | INTRAVENOUS | Status: DC
Start: 2023-01-09 — End: 2023-01-09

## 2023-01-09 NOTE — Progress Notes (Signed)
 Called to room to assist during endoscopic procedure.  Patient ID and intended procedure confirmed with present staff. Received instructions for my participation in the procedure from the performing physician.  

## 2023-01-09 NOTE — Telephone Encounter (Signed)
Orders have been placed in EPIC for labs to be completed around 04/01/23. 19 pages of records/demographic information have been faxed to [facility] for appointment to be scheduled.

## 2023-01-09 NOTE — Progress Notes (Signed)
Sedate, gd SR, tolerated procedure well, VSS, report to RN 

## 2023-01-09 NOTE — Op Note (Signed)
[facility] Patient Name: Denise Morales Procedure Date: 01/09/2023 8:05 AM MRN: [id number] Endoscopist: Lorin Picket E. Tomasa Rand , MD, [id number] Age: 62 Referring MD:  Date of Birth: December 19, 1960 Gender: Female Account #: [id number] Procedure:                Upper GI endoscopy Indications:              Iron deficiency anemia Medicines:                Monitored Anesthesia Care Procedure:                Pre-Anesthesia Assessment:                           - Prior to the procedure, a History and Physical                            was performed, and patient medications and                            allergies were reviewed. The patient's tolerance of                            previous anesthesia was also reviewed. The risks                            and benefits of the procedure and the sedation                            options and risks were discussed with the patient.                            All questions were answered, and informed consent                            was obtained. Prior Anticoagulants: The patient has                            taken no anticoagulant or antiplatelet agents. ASA                            Grade Assessment: II - A patient with mild systemic                            disease. After reviewing the risks and benefits,                            the patient was deemed in satisfactory condition to                            undergo the procedure.                           After obtaining informed consent, the endoscope was  passed under direct vision. Throughout the                            procedure, the patient's blood pressure, pulse, and                            oxygen saturations were monitored continuously. The                            Olympus Scope [id number] was introduced through the                            mouth, and advanced to the third part of duodenum.                            The upper GI endoscopy  was accomplished without                            difficulty. The patient tolerated the procedure                            well. Scope In: Scope Out: Findings:                 One benign-appearing, intrinsic mild stenosis was                            found at the gastroesophageal junction. This                            stenosis measured 1.3 cm (inner diameter) x less                            than one cm (in length). The stenosis was                            traversed. A TTS dilator was passed through the                            scope. Dilation with an 18-19-20 mm balloon dilator                            was performed to 20 mm. The dilation site was                            examined and showed no change. Estimated blood                            loss: none.                           The exam of the esophagus was otherwise normal.                           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 The                            proximal extent of the gastric folds (end of                            tubular esophagus) was 29 cm from the incisors. The                            hiatal narrowing was 35 cm from the incisors. The                            Z-line was 29 cm from the incisors.                           The exam of the stomach was otherwise normal.                           Biopsies were taken with a cold forceps in the                            gastric antrum for Helicobacter pylori testing.                            Estimated blood loss was minimal.                           The examined duodenum was normal. Biopsies for                            histology were taken with a cold forceps for                            evaluation of celiac disease. Estimated blood loss                            was minimal. Complications:            No immediate complications. Estimated Blood Loss:     Estimated blood loss was minimal. Impression:               -  Benign-appearing esophageal stenosis. Dilated.                           - 6 cm paraesophageal hernia. This appears to have                            progressed in size since last year                           - Normal examined duodenum. Biopsied.                           - Biopsies were taken with a cold forceps for  Helicobacter pylori testing.                           - No endoscopic findings to explain iron deficiency                            anemia, although presence of paraesophageal hernia                            raises likelihood of Cameron's ulcers as potential                            source. Recommendation:           - Patient has a contact number available for                            emergencies. The signs and symptoms of potential                            delayed complications were discussed with the                            patient. Return to normal activities tomorrow.                            Written discharge instructions were provided to the                            patient.                           - Resume previous diet.                           - Continue present medications.                           - Await pathology results.                           - Continue oral iron supplementation.                           - Repeat CBC in 3 months.                           - Consider FOBT testing and video capsule endoscopy                            if anemia persists                           - Consider surgical repair of paraesophageal hernia Harshal Sirmon E. Tomasa Rand, MD 01/09/2023 9:21:11 AM This report has been signed electronically.

## 2023-01-09 NOTE — Telephone Encounter (Signed)
-----   Message from Jenel Lucks sent at 01/09/2023  9:43 AM EDT ----- Regarding: Surgery consult, repeat CBC/iron panel in 3 months Bonita Quin,  Can you please place a surgery consult for paraesophageal hernia repair and order a CBC/iron panel to be drawn in 3 months?

## 2023-01-09 NOTE — Progress Notes (Signed)
[facility] History and Physical   Primary Care Physician:  Park Meo, FNP   Reason for Procedure:   Iron deficiency anemia  Plan:    EGD, colonoscopy     HPI: Denise Morales is a 62 y.o. female undergoing EGD and colonoscopy to evaluate iron deficiency anemia.  The patient's hemoglobin was found to be 8.6 and April of this year.  It appears she does have a chronic anemia, with baseline hemoglobin around 10-11 for the past 2 years.  He has no family history of colon cancer and no chronic GI symptoms. She had an EGD in June 2023 with a benign distal esophageal stricture, dilated to 15.5 mm, also noted to have a 5 cm hiatal hernia and mild pyloric stenosis. 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  She has had worsening reflux symptoms of late, and does report pill dysphagia, but denies dysphagia to solids.  Her hemoglobin was recently checked and was back to her baseline of 11.  Iron panels have been consistent with iron deficiency.  Past Medical History:  Diagnosis Date   Allergy    Anemia    Anxiety    Arthritis    Depression    Fibromyalgia    GERD (gastroesophageal reflux disease)    History of hiatal hernia    Hyperlipidemia    Hypertension    Hypothyroidism    Melanoma (HCC)    face   Thyroid disease     Past Surgical History:  Procedure Laterality Date   ABDOMINAL HYSTERECTOMY  1985   AIKEN OSTEOTOMY Right 06/05/2022   Procedure: Quintella Reichert OSTEOTOMY;  Surgeon: Candelaria Stagers, DPM;  Location: [facility];  Service: Podiatry;  Laterality: Right;   BICEPT TENODESIS Right 12/05/2022   Procedure: RIGHT BICEPS TENODESIS;  Surgeon: Cammy Copa, MD;  Location: [facility];  Service: Orthopedics;  Laterality: Right;   CAPSULOTOMY METATARSOPHALANGEAL Right 06/05/2022   Procedure: CAPSULOTOMY METATARSOPHALANGEAL;  Surgeon:  Candelaria Stagers, DPM;  Location: [facility];  Service: Podiatry;  Laterality: Right;   CHOLECYSTECTOMY     COLONOSCOPY     HALLUX VALGUS LAPIDUS Right 06/05/2022   Procedure: HALLUX VALGUS LAPIDUS;  Surgeon: Candelaria Stagers, DPM;  Location: [facility];  Service: Podiatry;  Laterality: Right;   HAMMER TOE SURGERY Right 06/05/2022   Procedure: HAMMER TOE CORRECTION;  Surgeon: Candelaria Stagers, DPM;  Location: [facility] ;  Service: Podiatry;  Laterality: Right;   REVERSE SHOULDER ARTHROPLASTY Right 12/05/2022   Procedure: RIGHT REVERSE SHOULDER ARTHROPLASTY;  Surgeon: Cammy Copa, MD;  Location: [facility] OR;  Service: Orthopedics;  Laterality: Right;   SHOULDER ARTHROSCOPY WITH ROTATOR CUFF REPAIR AND SUBACROMIAL DECOMPRESSION Right 08/12/2020   Procedure: RIGHT SHOULDER ARTHROSCOPY WITH ROTATOR CUFF REPAIR AND SUBACROMIAL DECOMPRESSION;  Surgeon: Kathryne Hitch, MD;  Location: [facility];  Service: Orthopedics;  Laterality: Right;   TOTAL KNEE ARTHROPLASTY Right 12/24/2020   Procedure: RIGHT TOTAL KNEE ARTHROPLASTY;  Surgeon: Kathryne Hitch, MD;  Location: [facility];  Service: Orthopedics;  Laterality: Right;   TOTAL KNEE ARTHROPLASTY Left 07/15/2021   Procedure: LEFT TOTAL KNEE ARTHROPLASTY;  Surgeon: Kathryne Hitch, MD;  Location: [facility];  Service: Orthopedics;  Laterality: Left;    Prior to Admission medications   Medication Sig Start Date End Date Taking? Authorizing Provider  acetaminophen (TYLENOL) 500 MG  tablet Take 1,000 mg by mouth at bedtime.    [provider]  aspirin EC 81 MG tablet Take 1 tablet (81 mg total) by mouth daily. Swallow whole. Patient not taking: Reported on 01/04/2023 12/06/22   Magnant, Joycie Peek, PA-C  busPIRone (BUSPAR) 10 MG tablet Take 10 mg by mouth daily as needed (Anxiety).    [provider]  celecoxib (CELEBREX) 200 MG capsule Take 1 capsule (200 mg total) by  mouth 2 (two) times daily between meals as needed. 01/01/23   Park Meo, FNP  cyclobenzaprine (FLEXERIL) 5 MG tablet Take 5 mg by mouth 3 (three) times daily as needed for muscle spasms.    [provider]  dicyclomine (BENTYL) 10 MG capsule Take 10 mg by mouth 3 (three) times daily before meals. 06/25/21   [provider]  diphenhydrAMINE (BENADRYL) 25 mg capsule Take 25 mg by mouth daily.    [provider]  docusate sodium (COLACE) 100 MG capsule Take 1 capsule (100 mg total) by mouth 2 (two) times daily. 12/06/22   Magnant, Charles L, PA-C  escitalopram (LEXAPRO) 20 MG tablet Take 1 tablet (20 mg total) by mouth daily. 10/23/22   Park Meo, FNP  famotidine (PEPCID) 40 MG tablet Take 1 tablet (40 mg total) by mouth at bedtime. 11/02/22   Esterwood, Amy S, PA-C  Iron, Ferrous Sulfate, 325 (65 Fe) MG TABS Take 325 mg by mouth daily. 10/26/22   Park Meo, FNP  Krill Oil 500 MG CAPS Take 500 mg by mouth in the morning.    [provider]  levothyroxine (SYNTHROID) 50 MCG tablet Take 1 tablet (50 mcg total) by mouth daily. Patient not taking: Reported on 01/04/2023 01/01/23   Park Meo, FNP  lisinopril-hydrochlorothiazide (ZESTORETIC) 10-12.5 MG tablet Take 1 tablet by mouth daily. 12/19/22   Park Meo, FNP  meclizine (ANTIVERT) 12.5 MG tablet Take 1 tablet (12.5 mg total) by mouth 3 (three) times daily as needed for dizziness. Patient not taking: Reported on 01/04/2023 11/27/22   Park Meo, FNP  ondansetron (ZOFRAN-ODT) 8 MG disintegrating tablet Take 8 mg by mouth every 8 (eight) hours as needed for nausea or vomiting.    [provider]  oxyCODONE (OXY IR/ROXICODONE) 5 MG immediate release tablet Take 1 tablet (5 mg total) by mouth every 4 (four) hours as needed for moderate pain (pain score 4-6). Patient not taking: Reported on 01/04/2023 12/06/22   Magnant, Joycie Peek, PA-C  pantoprazole (PROTONIX) 40 MG tablet TAKE 1 TABLET BY MOUTH  TWICE DAILY BEFORE A MEAL 01/08/23   Jenel Lucks, MD  pravastatin (PRAVACHOL) 40 MG tablet Take 1 tablet (40 mg total) by mouth daily. 01/01/23   Park Meo, FNP  pregabalin (LYRICA) 75 MG capsule Take 1 capsule (75 mg total) by mouth 2 (two) times daily. 01/01/23   Park Meo, FNP  QUEtiapine (SEROQUEL) 50 MG tablet Take 1 tablet (50 mg total) by mouth at bedtime. Patient taking differently: Take 50 mg by mouth See admin instructions. Every other night alternate with Janit Bern 11/14/22   Park Meo, FNP  Sod Picosulfate-Mag Ox-Cit Acd (CLENPIQ) 10-3.5-12 MG-GM -GM/175ML SOLN Take 1 kit by mouth as directed. 01/08/23   Esterwood, Amy S, PA-C  sucralfate (CARAFATE) 1 g tablet TAKE 1 TABLET BY MOUTH THREE TIMES DAILY AS NEEDED FOR HEART BURN 08/24/22   Doree Albee, PA-C  TURMERIC PO Take 1,000 mg by mouth daily.  [provider]  Vitamin D-Vitamin K (VITAMIN K2-VITAMIN D3 PO) Take 1 tablet by mouth daily. 100 mg/50 mcg    [provider]  zolpidem (AMBIEN) 5 MG tablet Take 1 tablet (5 mg total) by mouth See admin instructions. Every other night alternate with Quetiapine 12/12/22   Park Meo, FNP    Current Outpatient Medications  Medication Sig Dispense Refill   acetaminophen (TYLENOL) 500 MG tablet Take 1,000 mg by mouth at bedtime.     aspirin EC 81 MG tablet Take 1 tablet (81 mg total) by mouth daily. Swallow whole. (Patient not taking: Reported on 01/04/2023) 30 tablet 0   busPIRone (BUSPAR) 10 MG tablet Take 10 mg by mouth daily as needed (Anxiety).     celecoxib (CELEBREX) 200 MG capsule Take 1 capsule (200 mg total) by mouth 2 (two) times daily between meals as needed. 60 capsule 3   cyclobenzaprine (FLEXERIL) 5 MG tablet Take 5 mg by mouth 3 (three) times daily as needed for muscle spasms.     dicyclomine (BENTYL) 10 MG capsule Take 10 mg by mouth 3 (three) times daily before meals.     diphenhydrAMINE (BENADRYL) 25 mg capsule Take 25 mg by mouth  daily.     docusate sodium (COLACE) 100 MG capsule Take 1 capsule (100 mg total) by mouth 2 (two) times daily. 10 capsule 0   escitalopram (LEXAPRO) 20 MG tablet Take 1 tablet (20 mg total) by mouth daily. 90 tablet 1   famotidine (PEPCID) 40 MG tablet Take 1 tablet (40 mg total) by mouth at bedtime. 30 tablet 8   Iron, Ferrous Sulfate, 325 (65 Fe) MG TABS Take 325 mg by mouth daily. 90 tablet 1   Krill Oil 500 MG CAPS Take 500 mg by mouth in the morning.     levothyroxine (SYNTHROID) 50 MCG tablet Take 1 tablet (50 mcg total) by mouth daily. (Patient not taking: Reported on 01/04/2023) 90 tablet 3   lisinopril-hydrochlorothiazide (ZESTORETIC) 10-12.5 MG tablet Take 1 tablet by mouth daily. 90 tablet 1   meclizine (ANTIVERT) 12.5 MG tablet Take 1 tablet (12.5 mg total) by mouth 3 (three) times daily as needed for dizziness. (Patient not taking: Reported on 01/04/2023) 30 tablet 0   ondansetron (ZOFRAN-ODT) 8 MG disintegrating tablet Take 8 mg by mouth every 8 (eight) hours as needed for nausea or vomiting.     oxyCODONE (OXY IR/ROXICODONE) 5 MG immediate release tablet Take 1 tablet (5 mg total) by mouth every 4 (four) hours as needed for moderate pain (pain score 4-6). (Patient not taking: Reported on 01/04/2023) 30 tablet 0   pantoprazole (PROTONIX) 40 MG tablet TAKE 1 TABLET BY MOUTH TWICE DAILY BEFORE A MEAL 60 tablet 0   pravastatin (PRAVACHOL) 40 MG tablet Take 1 tablet (40 mg total) by mouth daily. 90 tablet 1   pregabalin (LYRICA) 75 MG capsule Take 1 capsule (75 mg total) by mouth 2 (two) times daily. 120 capsule 0   QUEtiapine (SEROQUEL) 50 MG tablet Take 1 tablet (50 mg total) by mouth at bedtime. (Patient taking differently: Take 50 mg by mouth See admin instructions. Every other night alternate with [place]) 30 tablet 3   Sod Picosulfate-Mag Ox-Cit Acd (CLENPIQ) 10-3.5-12 MG-GM -GM/175ML SOLN Take 1 kit by mouth as directed. 350 mL 0   sucralfate (CARAFATE) 1 g tablet TAKE 1 TABLET BY MOUTH  THREE TIMES DAILY AS NEEDED FOR HEART BURN 90 tablet 1   TURMERIC PO Take 1,000 mg by mouth  daily.     Vitamin D-Vitamin K (VITAMIN K2-VITAMIN D3 PO) Take 1 tablet by mouth daily. 100 mg/50 mcg     zolpidem (AMBIEN) 5 MG tablet Take 1 tablet (5 mg total) by mouth See admin instructions. Every other night alternate with Quetiapine 15 tablet 0   Current Facility-Administered Medications  Medication Dose Route Frequency Provider Last Rate Last Admin   0.9 %  sodium chloride infusion  500 mL Intravenous Once Jenel Lucks, MD        Allergies as of 01/09/2023 - Review Complete 01/09/2023  Allergen Reaction Noted   Hydroxyzine  10/17/2021    Family History  Problem Relation Age of Onset   Dementia Mother    Heart Problems Mother    Stroke Mother    Hypertension Mother    Hypertension Father    Colon polyps Father    Diabetes Father    Hypertension Sister    Diabetes Sister        borderline   Fibromyalgia Sister    Hypertension Sister    Colon polyps Sister    Diabetes Sister    Fibromyalgia Sister    Heart Problems Sister    Arthritis Sister    Hypertension Sister    Heart Problems Sister    Healthy Daughter    Diabetes Paternal Aunt    Colon cancer Neg Hx    Esophageal cancer Neg Hx    Rectal cancer Neg Hx    Stomach cancer Neg Hx    Breast cancer Neg Hx     Social History   Socioeconomic History   Marital status: Married    Spouse name: Not on file   Number of children: Not on file   Years of education: Not on file   Highest education level: Not on file  Occupational History   Not on file  Tobacco Use   Smoking status: Never   Smokeless tobacco: Never  Vaping Use   Vaping status: Never Used  Substance and Sexual Activity   Alcohol use: Not Currently   Drug use: Never   Sexual activity: Not Currently  Other Topics Concern   Not on file  Social History Narrative   Not on file   Social Determinants of Health   Financial Resource Strain: Not on  file  Food Insecurity: Not on file  Transportation Needs: Not on file  Physical Activity: Not on file  Stress: Not on file  Social Connections: Unknown (11/08/2021)   Received from [facility], [facility]   Social Network    Social Network: Not on file  Intimate Partner Violence: Unknown (09/30/2021)   Received from [facility], [facility]   HITS    Physically Hurt: Not on file    Insult or Talk Down To: Not on file    Threaten Physical Harm: Not on file    Scream or Curse: Not on file    Review of Systems:  All other review of systems negative except as mentioned in the HPI.  Physical Exam: Vital signs BP 125/83   Pulse 72   Temp 98.4 F (36.9 C)   Ht 4\' 10"  (1.473 m)   Wt 166 lb (75.3 kg)   SpO2 94%   BMI 34.69 kg/m   General:   Alert,  Well-developed, well-nourished, pleasant and cooperative in NAD Airway:  Mallampati 2 Lungs:  Clear throughout to auscultation.   Heart:  Regular rate and rhythm; no murmurs, clicks, rubs,  or gallops. Abdomen:  Soft,  nontender and nondistended. Normal bowel sounds.   Neuro/Psych:  Normal mood and affect. A and O x 3   Quasim Doyon E. Tomasa Rand, MD [facility]

## 2023-01-09 NOTE — Op Note (Signed)
[facility] Patient Name: Denise Morales Procedure Date: 01/09/2023 8:04 AM MRN: [id number] Endoscopist: Lorin Picket E. Tomasa Rand , MD, [id number] Age: 62 Referring MD:  Date of Birth: 1960-07-07 Gender: Female Account #: [id number] Procedure:                Colonoscopy Indications:              Iron deficiency anemia Medicines:                Monitored Anesthesia Care Procedure:                Pre-Anesthesia Assessment:                           - Prior to the procedure, a History and Physical                            was performed, and patient medications and                            allergies were reviewed. The patient's tolerance of                            previous anesthesia was also reviewed. The risks                            and benefits of the procedure and the sedation                            options and risks were discussed with the patient.                            All questions were answered, and informed consent                            was obtained. Prior Anticoagulants: The patient has                            taken no anticoagulant or antiplatelet agents. ASA                            Grade Assessment: II - A patient with mild systemic                            disease. After reviewing the risks and benefits,                            the patient was deemed in satisfactory condition to                            undergo the procedure.                           After obtaining informed consent, the colonoscope  was passed under direct vision. Throughout the                            procedure, the patient's blood pressure, pulse, and                            oxygen saturations were monitored continuously. The                            [id number] Colonoscope [id number] was introduced                            through the anus and advanced to the the terminal                            ileum, with identification of  the appendiceal                            orifice and IC valve. The colonoscopy was somewhat                            difficult due to restricted mobility of the colon.                            The patient tolerated the procedure well. The                            quality of the bowel preparation was adequate. The                            terminal ileum, ileocecal valve, appendiceal                            orifice, and rectum were photographed. The bowel                            preparation used was SUPREP via split dose                            instruction. Scope In: 8:45:43 AM Scope Out: 9:04:08 AM Scope Withdrawal Time: 0 hours 10 minutes 49 seconds  Total Procedure Duration: 0 hours 18 minutes 25 seconds  Findings:                 The perianal and digital rectal examinations were                            normal. Pertinent negatives include normal                            sphincter tone and no palpable rectal lesions.                           Multiple small-mouthed diverticula were found in  the sigmoid colon and descending colon with a                            fixed, angulated narrowing in the distal sigmoid                            colon.                           A localized area of polypoid mucosa was found at                            the appendiceal orifice. Biopsies were taken with a                            cold forceps for histology. Estimated blood loss                            was minimal.                           The exam was otherwise normal throughout the                            examined colon.                           The terminal ileum appeared normal.                           The retroflexed view of the distal rectum and anal                            verge was normal and showed no anal or rectal                            abnormalities. Complications:            No immediate complications. Estimated  Blood Loss:     Estimated blood loss: none. Impression:               - Diverticulosis in the sigmoid colon and in the                            descending colon.                           - Polypoid mucosa at the appendiceal orifice.                            Biopsied to exclude dysplasia.                           - The examined portion of the ileum was normal.                           -  The distal rectum and anal verge are normal on                            retroflexion view.                           - No abnormalities to explain iron deficiency anemia Recommendation:           - Patient has a contact number available for                            emergencies. The signs and symptoms of potential                            delayed complications were discussed with the                            patient. Return to normal activities tomorrow.                            Written discharge instructions were provided to the                            patient.                           - Resume previous diet.                           - Continue present medications.                           - Repeat colonoscopy in 7 years for surveillance                            unless appendiceal orifice biopsies show dysplasia.                           - Await pathology results. Loran Auguste E. Tomasa Rand, MD 01/09/2023 9:34:37 AM This report has been signed electronically.

## 2023-01-09 NOTE — Patient Instructions (Addendum)
Resume previous diet  Continue present medications Await pathology results Handouts/information given for esophageal stricture(stenosis), esophageal dilation, Hiatal Hernia, polyps and diverticulosis   YOU HAD AN ENDOSCOPIC PROCEDURE TODAY AT THE [facility]:   Refer to the procedure report that was given to you for any specific questions about what was found during the examination.  If the procedure report does not answer your questions, please call your gastroenterologist to clarify.  If you requested that your care partner not be given the details of your procedure findings, then the procedure report has been included in a sealed envelope for you to review at your convenience later.  YOU SHOULD EXPECT: Some feelings of bloating in the abdomen. Passage of more gas than usual.  Walking can help get rid of the air that was put into your GI tract during the procedure and reduce the bloating. If you had a lower endoscopy (such as a colonoscopy or flexible sigmoidoscopy) you may notice spotting of blood in your stool or on the toilet paper. If you underwent a bowel prep for your procedure, you may not have a normal bowel movement for a few days.  Please Note:  You might notice some irritation and congestion in your nose or some drainage.  This is from the oxygen used during your procedure.  There is no need for concern and it should clear up in a day or so.  SYMPTOMS TO REPORT IMMEDIATELY:  Following lower endoscopy (colonoscopy):  Excessive amounts of blood in the stool  Significant tenderness or worsening of abdominal pains  Swelling of the abdomen that is new, acute  Fever of 100F or higher Following upper endoscopy (EGD)  Vomiting of blood or coffee ground material  New chest pain or pain under the shoulder blades  Painful or persistently difficult swallowing  New shortness of breath  Black, tarry-looking stools  For urgent or emergent issues, a gastroenterologist can be  reached at any hour by calling [phone]. Do not use MyChart messaging for urgent concerns.   DIET:  We do recommend a small meal at first, but then you may proceed to your regular diet.  Drink plenty of fluids but you should avoid alcoholic beverages for 24 hours.  ACTIVITY:  You should plan to take it easy for the rest of today and you should NOT DRIVE or use heavy machinery until tomorrow (because of the sedation medicines used during the test).    FOLLOW UP: Our staff will call the number listed on your records the next business day following your procedure.  We will call around 7:15- 8:00 am to check on you and address any questions or concerns that you may have regarding the information given to you following your procedure. If we do not reach you, we will leave a message.     If any biopsies were taken you will be contacted by phone or by letter within the next 1-3 weeks.  Please call us at [phone] if you have not heard about the biopsies in 3 weeks.   SIGNATURES/CONFIDENTIALITY: You and/or your care partner have signed paperwork which will be entered into your electronic medical record.  These signatures attest to the fact that that the information above on your After Visit Summary has been reviewed and is understood.  Full responsibility of the confidentiality of this discharge information lies with you and/or your care-partner.

## 2023-01-10 ENCOUNTER — Telehealth: Payer: Self-pay | Admitting: *Deleted

## 2023-01-10 NOTE — Telephone Encounter (Signed)
Patient is scheduled with Dr Fredricka Bonine on 01/24/23.

## 2023-01-10 NOTE — Telephone Encounter (Signed)
  Follow up Call-     01/09/2023    8:09 AM 12/15/2021    9:09 AM 05/30/2021   11:09 AM  Call back number  Post procedure Call Back phone  # [phone]  Permission to leave phone message Yes Yes Yes     Patient questions:  Do you have a fever, pain , or abdominal swelling? No. Pain Score  0 *  Have you tolerated food without any problems? Yes.    Have you been able to return to your normal activities? Yes.    Do you have any questions about your discharge instructions: Diet   No. Medications  No. Follow up visit  No.  Do you have questions or concerns about your Care? No.  Actions: * If pain score is 4 or above: No action needed, pain <4.

## 2023-01-15 NOTE — Progress Notes (Signed)
Denise Morales, The biopsies taken from your stomach were notable for mild reactive gastropathy which is a common finding and often related to use of certain medications (usually NSAIDs), but there was no evidence of Helicobacter pylori infection. This common finding is not felt to necessarily be a cause of any particular symptom and there is no specific treatment or further evaluation recommended. The biopsies of your duodenum were normal.  There was no evidence of celiac disease.  The biopsies of your appendiceal orifice were normal.  There were no precancerous changes.  I recommend you repeat a colonoscopy in 7 years given your history of precancerous polyps in 2022.  Plan to repeat CBC and iron panel in 3 months.

## 2023-01-16 ENCOUNTER — Ambulatory Visit: Payer: Medicaid Other

## 2023-01-16 DIAGNOSIS — G8929 Other chronic pain: Secondary | ICD-10-CM

## 2023-01-16 DIAGNOSIS — M25511 Pain in right shoulder: Secondary | ICD-10-CM | POA: Diagnosis not present

## 2023-01-16 DIAGNOSIS — M6281 Muscle weakness (generalized): Secondary | ICD-10-CM

## 2023-01-16 NOTE — Therapy (Signed)
OUTPATIENT PHYSICAL THERAPY TREATMENT   Patient Name: Denise Morales MRN: [id number] DOB:Oct 13, 1960, 62 y.o., female Today's Date: 01/16/2023  END OF SESSION:  PT End of Session - 01/16/23 0841     Visit Number 2    Number of Visits 17    Date for PT Re-Evaluation 03/01/23    Authorization Type [employer]    Authorization Time Period 7/22-10/20/24    Authorization - Visit Number 1    Authorization - Number of Visits 15    PT Start Time [phone]    PT Stop Time 0930    PT Time Calculation (min) 47 min    Activity Tolerance Patient tolerated treatment well    Behavior During Therapy WFL for tasks assessed/performed             Past Medical History:  Diagnosis Date   Allergy    Anemia    Anxiety    Arthritis    Depression    Fibromyalgia    GERD (gastroesophageal reflux disease)    History of hiatal hernia    Hyperlipidemia    Hypertension    Hypothyroidism    Melanoma (HCC)    face   Thyroid disease    Past Surgical History:  Procedure Laterality Date   ABDOMINAL HYSTERECTOMY  1985   AIKEN OSTEOTOMY Right 06/05/2022   Procedure: Quintella Reichert OSTEOTOMY;  Surgeon: Candelaria Stagers, DPM;  Location: [facility];  Service: Podiatry;  Laterality: Right;   BICEPT TENODESIS Right 12/05/2022   Procedure: RIGHT BICEPS TENODESIS;  Surgeon: Cammy Copa, MD;  Location: [facility] OR;  Service: Orthopedics;  Laterality: Right;   CAPSULOTOMY METATARSOPHALANGEAL Right 06/05/2022   Procedure: CAPSULOTOMY METATARSOPHALANGEAL;  Surgeon: Candelaria Stagers, DPM;  Location: [facility];  Service: Podiatry;  Laterality: Right;   CHOLECYSTECTOMY     COLONOSCOPY     HALLUX VALGUS LAPIDUS Right 06/05/2022   Procedure: HALLUX VALGUS LAPIDUS;  Surgeon: Candelaria Stagers, DPM;  Location: [facility];  Service: Podiatry;  Laterality: Right;   HAMMER TOE SURGERY Right 06/05/2022   Procedure: HAMMER TOE CORRECTION;  Surgeon: Candelaria Stagers, DPM;  Location:  [facility];  Service: Podiatry;  Laterality: Right;   REVERSE SHOULDER ARTHROPLASTY Right 12/05/2022   Procedure: RIGHT REVERSE SHOULDER ARTHROPLASTY;  Surgeon: Cammy Copa, MD;  Location: [facility] OR;  Service: Orthopedics;  Laterality: Right;   SHOULDER ARTHROSCOPY WITH ROTATOR CUFF REPAIR AND SUBACROMIAL DECOMPRESSION Right 08/12/2020   Procedure: RIGHT SHOULDER ARTHROSCOPY WITH ROTATOR CUFF REPAIR AND SUBACROMIAL DECOMPRESSION;  Surgeon: Kathryne Hitch, MD;  Location: [facility];  Service: Orthopedics;  Laterality: Right;   TOTAL KNEE ARTHROPLASTY Right 12/24/2020   Procedure: RIGHT TOTAL KNEE ARTHROPLASTY;  Surgeon: Kathryne Hitch, MD;  Location: [facility];  Service: Orthopedics;  Laterality: Right;   TOTAL KNEE ARTHROPLASTY Left 07/15/2021   Procedure: LEFT TOTAL KNEE ARTHROPLASTY;  Surgeon: Kathryne Hitch, MD;  Location: [facility];  Service: Orthopedics;  Laterality: Left;   Patient Active Problem List   Diagnosis Date Noted   Arthritis of right shoulder region 12/24/2022   Biceps tendonitis on right 12/24/2022   S/P reverse total shoulder arthroplasty, right 12/05/2022   Vertigo 11/27/2022   Iron deficiency anemia 10/30/2022   Physical exam, annual 10/30/2022   Medication management 10/30/2022   Class 2 severe obesity due to excess calories with serious comorbidity in adult, unspecified BMI (HCC) 10/23/2022   Essential hypertension 10/23/2022   Prediabetes 10/23/2022  Hypothyroidism 10/23/2022   Insomnia 06/14/2022   Status post total left knee replacement 07/15/2021   Neoplasm of uncertain behavior of skin 05/25/2021   Generalized osteoarthritis of multiple sites 05/25/2021   Fibromyalgia syndrome 05/25/2021   Restless legs 03/16/2021   Hyperlipidemia 03/16/2021   Anxiety 03/16/2021   Status post right knee replacement 12/24/2020   Unilateral primary osteoarthritis, left knee 11/11/2020   Unilateral primary osteoarthritis,  right knee 11/11/2020   Complete tear of right rotator cuff 08/12/2020   Hypertensive disorder 05/05/2019   PCP: Park Meo, FNP  REFERRING PROVIDER: Cammy Copa, MD  REFERRING DIAG: History of arthroplasty of right shoulder [Z96.611]   THERAPY DIAG:  Chronic right shoulder pain  Muscle weakness (generalized)  Rationale for Evaluation and Treatment: Rehabilitation  ONSET DATE: 12/05/22 Rt RSA  SUBJECTIVE:                                                                                                                                                                                                         SUBJECTIVE STATEMENT:  "It's hurting today."  PERTINENT HISTORY:  PMHx includes anemia, anxiety/depression, fibromyalgia, HTN, hypothyroidism, vertigo  PAIN:  Are you having pain? Yes: NPRS scale: 4/10 Pain location: Rt shoulder Pain description: "hurt" Aggravating factors: overactivity,movement Relieving factors: ice,medication   PRECAUTIONS: None    WEIGHT BEARING RESTRICTIONS: No    OBJECTIVE:   DIAGNOSTIC FINDINGS:   12/31/22 XR Shoulder Right   AP, Scap Y, axillary views right shoulder reviewed.  Right shoulder reverse shoulder prosthesis in good position alignment without any complicating features.  No change compared with prior radiographs from 1 to 2 weeks ago.  No fracture/dislocation.    PATIENT SURVEYS:  FOTO 40 current, 55 predicted  COGNITION: Overall cognitive status: Within functional limits for tasks assessed  SENSATION: WFL  POSTURE: rounded shoulders  PALPATION: Tenderness to palpation along lateral shoulder related to recent fall on 12/23/22. No significant post-op related tenderness to palpation.     UPPER EXTREMITY ROM:  Active ROM Right eval Left eval  Shoulder flexion 140   Shoulder extension    Shoulder abduction 80   Shoulder adduction    Shoulder extension    Shoulder internal rotation 72   Shoulder  external rotation 35   Elbow flexion    Elbow extension    Wrist flexion    Wrist extension    Wrist ulnar deviation    Wrist radial deviation    Wrist pronation    Wrist supination     (Blank rows = not tested)  R shoulder abduction improved by 10deg following manual PT today.   UPPER EXTREMITY MMT: not formally assess at eval d/t ROM deficits and post-op status  MMT Right eval Left eval  Shoulder flexion    Shoulder extension    Shoulder abduction    Shoulder adduction    Shoulder extension    Shoulder internal rotation    Shoulder external rotation    Middle trapezius    Lower trapezius    Elbow flexion    Elbow extension    Wrist flexion    Wrist extension    Wrist ulnar deviation    Wrist radial deviation    Wrist pronation    Wrist supination    Grip strength     (Blank rows = not tested)   TODAY'S TREATMENT:                                                                                                                              [facility] Adult PT Treatment:                                                DATE: 01/16/23 Therapeutic Exercise: Supine shoulder flexion AAROM with dowel x 8  Supine shoulder ER AAROM with dowel x 8  Updated HEP  Manual Therapy: Rt shoulder flexion, abduction, ER PROM to tolerance   Self Care: Educated on post-operative precautions and surgical intervention Modalities for pain control   [facility] Adult PT Treatment:                                                DATE: 01/04/2023  Therapeutic Exercise: Seated pendulum  Created and reviewed initial HEP as noted below   Manual Therapy: PROM shoulder flexion, scaption, abduction   PATIENT EDUCATION:  Education details: HEP Person educated: Patient Education method: Explanation, Demonstration, and Handouts Education comprehension: verbalized understanding, returned demonstration, and needs further education  HOME EXERCISE PROGRAM: Access Code: [email or web address] URL:  [email or web address] Date: 01/04/2023 Prepared by: Mauri Reading  Exercises - Seated Shoulder Flexion Towel Slide at Table Top  - 2 x daily - 7 x weekly - 2 sets - 10 reps - 3 sec hold - Seated Shoulder Abduction Towel Slide at Table Top  - 2 x daily - 7 x weekly - 2 sets - 10 reps - 3 sec hold - Seated Shoulder Pendulum Exercise  - 2 x daily - 7 x weekly - 2 minutes hold - Horizontal Shoulder Pendulum with Table Support  - 2 x daily - 7 x weekly - 2 min hold  ASSESSMENT:  CLINICAL IMPRESSION: Patient is 6 week s/p Rt RSA arriving with mild Rt shoulder pain. Focused on  Rt shoulder flexion,abduction, and ER PROM with good tolerance. Progressed flexion and ER AAROM without an increase in shoulder pain. HEP was updated to include further AAROM. She declined modalities at conclusion of session.   OBJECTIVE IMPAIRMENTS: decreased activity tolerance, decreased endurance, decreased ROM, decreased strength, impaired UE functional use, postural dysfunction, and pain.   ACTIVITY LIMITATIONS: carrying, lifting, sleeping, bathing, dressing, reach over head, and hygiene/grooming  PARTICIPATION LIMITATIONS: meal prep, cleaning, driving, shopping, community activity, and yard work  PERSONAL FACTORS: Past/current experiences and 1-2 comorbidities: PMHx includes anemia, anxiety/depression, fibromyalgia, HTN, hypothyroidism, vertigo  are also affecting patient's functional outcome.   REHAB POTENTIAL: Good  CLINICAL DECISION MAKING: Evolving/moderate complexity  EVALUATION COMPLEXITY: Moderate   GOALS: Goals reviewed with patient? Yes  SHORT TERM GOALS: Target date: 02/01/2023    Patient will be independent with initial home program for Rt shoulder ROM.  Baseline: provided at eval Goal status: INITIAL  2.  Patient will demonstrate improved AROM by 10 degrees in shoulder flexion/abduction.  Baseline: see objective findings Goal status: INITIAL    LONG TERM GOALS: Target date:  03/01/2023    Patient will report improved overall functional ability with FOTO score of 55 or greater.  Baseline: 40 Goal status: INITIAL  2.  Patient will report 2/10 or less with ADLs including don/doff bra and washing her back.  Baseline: difficulty related to pain and limited AROM  Goal status: INITIAL  3.  Patient will demonstrate at least 4/5 R UE strength in all directions Baseline: see objective findings  Goal status: INITIAL  4.  Patient will report ability to return to driving 20 minutes or greater without exacerbation of pain. Baseline: limited to short distance and unable to use R arm as normal when turning.  Goal status: INITIAL  5.  Patient will report improved sleep quality without waking d/t R shoulder pain.  Baseline: walking d/t BIL shoulder pain throughout the night Goal status: INITIAL  6.  Patient will demonstrate ability to push/pull at least 40# with <3/10 pain in order to return to heavy household and yardwork tasks.  Baseline: unable to sweep/vacuum/push the lawnmower Goal status: INITIAL   PLAN:  PT FREQUENCY: 2x/week  PT DURATION: 8 weeks  PLANNED INTERVENTIONS: Therapeutic exercises, Therapeutic activity, Neuromuscular re-education, Patient/Family education, Self Care, Joint mobilization, Electrical stimulation, Cryotherapy, Moist heat, Manual therapy, and Re-evaluation  PLAN FOR NEXT SESSION: PROM, AAROM->AROM progression per post op status, manual therapy and pain modulation activities as needed.   Letitia Libra, PT, DPT, ATC 01/16/23 9:37 AM

## 2023-01-17 ENCOUNTER — Encounter: Payer: Medicaid Other | Admitting: Surgical

## 2023-01-17 ENCOUNTER — Encounter: Payer: Medicaid Other | Admitting: Orthopedic Surgery

## 2023-01-18 ENCOUNTER — Other Ambulatory Visit: Payer: Self-pay | Admitting: Family Medicine

## 2023-01-18 ENCOUNTER — Ambulatory Visit: Payer: Medicaid Other

## 2023-01-18 ENCOUNTER — Encounter: Payer: Self-pay | Admitting: Family Medicine

## 2023-01-18 DIAGNOSIS — M25511 Pain in right shoulder: Secondary | ICD-10-CM | POA: Diagnosis not present

## 2023-01-18 DIAGNOSIS — M6281 Muscle weakness (generalized): Secondary | ICD-10-CM

## 2023-01-18 DIAGNOSIS — G8929 Other chronic pain: Secondary | ICD-10-CM

## 2023-01-18 NOTE — Therapy (Signed)
OUTPATIENT PHYSICAL THERAPY TREATMENT   Patient Name: Denise Morales MRN: [id number] DOB:1961/01/07, 62 y.o., female Today's Date: 01/18/2023  END OF SESSION:  PT End of Session - 01/18/23 1231     Visit Number 3    Number of Visits 17    Date for PT Re-Evaluation 03/01/23    Authorization Type [employer]    Authorization Time Period 7/22-10/20/24    Authorization - Visit Number 2    Authorization - Number of Visits 15    PT Start Time 1230    PT Stop Time 1312    PT Time Calculation (min) 42 min    Activity Tolerance Patient tolerated treatment well    Behavior During Therapy WFL for tasks assessed/performed             Past Medical History:  Diagnosis Date   Allergy    Anemia    Anxiety    Arthritis    Depression    Fibromyalgia    GERD (gastroesophageal reflux disease)    History of hiatal hernia    Hyperlipidemia    Hypertension    Hypothyroidism    Melanoma (HCC)    face   Thyroid disease    Past Surgical History:  Procedure Laterality Date   ABDOMINAL HYSTERECTOMY  1985   AIKEN OSTEOTOMY Right 06/05/2022   Procedure: Quintella Reichert OSTEOTOMY;  Surgeon: Candelaria Stagers, DPM;  Location: [facility];  Service: Podiatry;  Laterality: Right;   BICEPT TENODESIS Right 12/05/2022   Procedure: RIGHT BICEPS TENODESIS;  Surgeon: Cammy Copa, MD;  Location: [facility] OR;  Service: Orthopedics;  Laterality: Right;   CAPSULOTOMY METATARSOPHALANGEAL Right 06/05/2022   Procedure: CAPSULOTOMY METATARSOPHALANGEAL;  Surgeon: Candelaria Stagers, DPM;  Location: [facility];  Service: Podiatry;  Laterality: Right;   CHOLECYSTECTOMY     COLONOSCOPY     HALLUX VALGUS LAPIDUS Right 06/05/2022   Procedure: HALLUX VALGUS LAPIDUS;  Surgeon: Candelaria Stagers, DPM;  Location: [facility];  Service: Podiatry;  Laterality: Right;   HAMMER TOE SURGERY Right 06/05/2022   Procedure: HAMMER TOE CORRECTION;  Surgeon: Candelaria Stagers, DPM;  Location:  [facility];  Service: Podiatry;  Laterality: Right;   REVERSE SHOULDER ARTHROPLASTY Right 12/05/2022   Procedure: RIGHT REVERSE SHOULDER ARTHROPLASTY;  Surgeon: Cammy Copa, MD;  Location: [facility] OR;  Service: Orthopedics;  Laterality: Right;   SHOULDER ARTHROSCOPY WITH ROTATOR CUFF REPAIR AND SUBACROMIAL DECOMPRESSION Right 08/12/2020   Procedure: RIGHT SHOULDER ARTHROSCOPY WITH ROTATOR CUFF REPAIR AND SUBACROMIAL DECOMPRESSION;  Surgeon: Kathryne Hitch, MD;  Location: [facility];  Service: Orthopedics;  Laterality: Right;   TOTAL KNEE ARTHROPLASTY Right 12/24/2020   Procedure: RIGHT TOTAL KNEE ARTHROPLASTY;  Surgeon: Kathryne Hitch, MD;  Location: [facility];  Service: Orthopedics;  Laterality: Right;   TOTAL KNEE ARTHROPLASTY Left 07/15/2021   Procedure: LEFT TOTAL KNEE ARTHROPLASTY;  Surgeon: Kathryne Hitch, MD;  Location: [facility];  Service: Orthopedics;  Laterality: Left;   Patient Active Problem List   Diagnosis Date Noted   Arthritis of right shoulder region 12/24/2022   Biceps tendonitis on right 12/24/2022   S/P reverse total shoulder arthroplasty, right 12/05/2022   Vertigo 11/27/2022   Iron deficiency anemia 10/30/2022   Physical exam, annual 10/30/2022   Medication management 10/30/2022   Class 2 severe obesity due to excess calories with serious comorbidity in adult, unspecified BMI (HCC) 10/23/2022   Essential hypertension 10/23/2022   Prediabetes 10/23/2022  Hypothyroidism 10/23/2022   Insomnia 06/14/2022   Status post total left knee replacement 07/15/2021   Neoplasm of uncertain behavior of skin 05/25/2021   Generalized osteoarthritis of multiple sites 05/25/2021   Fibromyalgia syndrome 05/25/2021   Restless legs 03/16/2021   Hyperlipidemia 03/16/2021   Anxiety 03/16/2021   Status post right knee replacement 12/24/2020   Unilateral primary osteoarthritis, left knee 11/11/2020   Unilateral primary osteoarthritis,  right knee 11/11/2020   Complete tear of right rotator cuff 08/12/2020   Hypertensive disorder 05/05/2019   PCP: Park Meo, FNP  REFERRING PROVIDER: Cammy Copa, MD  REFERRING DIAG: History of arthroplasty of right shoulder [Z96.611]   THERAPY DIAG:  Chronic right shoulder pain  Muscle weakness (generalized)  Rationale for Evaluation and Treatment: Rehabilitation  ONSET DATE: 12/05/22 Rt RSA  SUBJECTIVE:                                                                                                                                                                                                         SUBJECTIVE STATEMENT:  "It's ok today."  PERTINENT HISTORY:  PMHx includes anemia, anxiety/depression, fibromyalgia, HTN, hypothyroidism, vertigo  PAIN:  Are you having pain? Yes: NPRS scale: 3/10 Pain location: Rt shoulder Pain description: ache;hurt Aggravating factors: overactivity,movement Relieving factors: ice,medication   PRECAUTIONS: None    WEIGHT BEARING RESTRICTIONS: No    OBJECTIVE:   DIAGNOSTIC FINDINGS:   12/31/22 XR Shoulder Right   AP, Scap Y, axillary views right shoulder reviewed.  Right shoulder reverse shoulder prosthesis in good position alignment without any complicating features.  No change compared with prior radiographs from 1 to 2 weeks ago.  No fracture/dislocation.    PATIENT SURVEYS:  FOTO 40 current, 55 predicted  COGNITION: Overall cognitive status: Within functional limits for tasks assessed  SENSATION: WFL  POSTURE: rounded shoulders  PALPATION: Tenderness to palpation along lateral shoulder related to recent fall on 12/23/22. No significant post-op related tenderness to palpation.     UPPER EXTREMITY ROM:  Active ROM Right eval Left eval 01/18/23 Rt PROM  Shoulder flexion 140  140  Shoulder extension     Shoulder abduction 80  96  Shoulder adduction     Shoulder extension     Shoulder internal  rotation 72    Shoulder external rotation 35  46  Elbow flexion     Elbow extension     Wrist flexion     Wrist extension     Wrist ulnar deviation     Wrist radial deviation     Wrist  pronation     Wrist supination      (Blank rows = not tested)  R shoulder abduction improved by 10deg following manual PT today.   UPPER EXTREMITY MMT: not formally assess at eval d/t ROM deficits and post-op status  MMT Right eval Left eval  Shoulder flexion    Shoulder extension    Shoulder abduction    Shoulder adduction    Shoulder extension    Shoulder internal rotation    Shoulder external rotation    Middle trapezius    Lower trapezius    Elbow flexion    Elbow extension    Wrist flexion    Wrist extension    Wrist ulnar deviation    Wrist radial deviation    Wrist pronation    Wrist supination    Grip strength     (Blank rows = not tested)   TODAY'S TREATMENT:                                                                                                                              [facility] Adult PT Treatment:                                                DATE: 01/18/23 Therapeutic Exercise: Supine shoulder flexion AAROM with dowel x 10  Supine shoulder ER AAROM with dowel x 10  Scapular retraction 2 x10  Deltoid isometrics 2 x 10; 5 sec hold  Seated bicep curl 1# 2 x 10  Updated HEP  Manual Therapy: Rt shoulder PROM ER, Abduction, flexion to tolerance  Scar tissue mobilization    [facility] Adult PT Treatment:                                                DATE: 01/16/23 Therapeutic Exercise: Supine shoulder flexion AAROM with dowel x 8  Supine shoulder ER AAROM with dowel x 8  Updated HEP  Manual Therapy: Rt shoulder flexion, abduction, ER PROM to tolerance   Self Care: Educated on post-operative precautions and surgical intervention Modalities for pain control   [facility] Adult PT Treatment:                                                DATE: 01/04/2023  Therapeutic  Exercise: Seated pendulum  Created and reviewed initial HEP as noted below   Manual Therapy: PROM shoulder flexion, scaption, abduction   PATIENT EDUCATION:  Education details: HEP Person educated: Patient Education method: Explanation, Demonstration, and Handouts Education comprehension: verbalized understanding, returned demonstration, and needs  further education  HOME EXERCISE PROGRAM: Access Code: [email or web address] URL: [email or web address]   ASSESSMENT:  CLINICAL IMPRESSION: Patient arrives with mild Rt shoulder pain. She is making gradual progression with Rt shoulder ROM with PROM measured today (see above for specifics). Introduced postural strengthening and deltoid isometrics with good tolerance, but does require intermittent cues to control for upper trap compensation with scapular retraction. She declined modalities with plan to ice at home.   OBJECTIVE IMPAIRMENTS: decreased activity tolerance, decreased endurance, decreased ROM, decreased strength, impaired UE functional use, postural dysfunction, and pain.   ACTIVITY LIMITATIONS: carrying, lifting, sleeping, bathing, dressing, reach over head, and hygiene/grooming  PARTICIPATION LIMITATIONS: meal prep, cleaning, driving, shopping, community activity, and yard work  PERSONAL FACTORS: Past/current experiences and 1-2 comorbidities: PMHx includes anemia, anxiety/depression, fibromyalgia, HTN, hypothyroidism, vertigo  are also affecting patient's functional outcome.   REHAB POTENTIAL: Good  CLINICAL DECISION MAKING: Evolving/moderate complexity  EVALUATION COMPLEXITY: Moderate   GOALS: Goals reviewed with patient? Yes  SHORT TERM GOALS: Target date: 02/01/2023    Patient will be independent with initial home program for Rt shoulder ROM.  Baseline: provided at eval Goal status: INITIAL  2.  Patient will demonstrate improved AROM by 10 degrees in shoulder flexion/abduction.  Baseline: see objective  findings Goal status: INITIAL    LONG TERM GOALS: Target date: 03/01/2023    Patient will report improved overall functional ability with FOTO score of 55 or greater.  Baseline: 40 Goal status: INITIAL  2.  Patient will report 2/10 or less with ADLs including don/doff bra and washing her back.  Baseline: difficulty related to pain and limited AROM  Goal status: INITIAL  3.  Patient will demonstrate at least 4/5 R UE strength in all directions Baseline: see objective findings  Goal status: INITIAL  4.  Patient will report ability to return to driving 20 minutes or greater without exacerbation of pain. Baseline: limited to short distance and unable to use R arm as normal when turning.  Goal status: INITIAL  5.  Patient will report improved sleep quality without waking d/t R shoulder pain.  Baseline: walking d/t BIL shoulder pain throughout the night Goal status: INITIAL  6.  Patient will demonstrate ability to push/pull at least 40# with <3/10 pain in order to return to heavy household and yardwork tasks.  Baseline: unable to sweep/vacuum/push the lawnmower Goal status: INITIAL   PLAN:  PT FREQUENCY: 2x/week  PT DURATION: 8 weeks  PLANNED INTERVENTIONS: Therapeutic exercises, Therapeutic activity, Neuromuscular re-education, Patient/Family education, Self Care, Joint mobilization, Electrical stimulation, Cryotherapy, Moist heat, Manual therapy, and Re-evaluation  PLAN FOR NEXT SESSION: PROM, AAROM->AROM progression per post op status, manual therapy and pain modulation activities as needed.   Letitia Libra, PT, DPT, ATC 01/18/23 1:14 PM

## 2023-01-23 ENCOUNTER — Ambulatory Visit: Payer: Medicaid Other

## 2023-01-23 DIAGNOSIS — M25511 Pain in right shoulder: Secondary | ICD-10-CM | POA: Diagnosis not present

## 2023-01-23 DIAGNOSIS — G8929 Other chronic pain: Secondary | ICD-10-CM

## 2023-01-23 DIAGNOSIS — M6281 Muscle weakness (generalized): Secondary | ICD-10-CM

## 2023-01-23 NOTE — Therapy (Signed)
OUTPATIENT PHYSICAL THERAPY TREATMENT   Patient Name: Denise Morales MRN: [id number] DOB:Apr 29, 1961, 62 y.o., female Today's Date: 01/23/2023  END OF SESSION:  PT End of Session - 01/23/23 0839     Visit Number 4    Number of Visits 17    Date for PT Re-Evaluation 03/01/23    Authorization Type [employer]    Authorization Time Period 7/22-10/20/24    Authorization - Visit Number 3    Authorization - Number of Visits 15    PT Start Time 0845    PT Stop Time 0925    PT Time Calculation (min) 40 min    Activity Tolerance Patient tolerated treatment well    Behavior During Therapy WFL for tasks assessed/performed             Past Medical History:  Diagnosis Date   Allergy    Anemia    Anxiety    Arthritis    Depression    Fibromyalgia    GERD (gastroesophageal reflux disease)    History of hiatal hernia    Hyperlipidemia    Hypertension    Hypothyroidism    Melanoma (HCC)    face   Thyroid disease    Past Surgical History:  Procedure Laterality Date   ABDOMINAL HYSTERECTOMY  1985   AIKEN OSTEOTOMY Right 06/05/2022   Procedure: Quintella Reichert OSTEOTOMY;  Surgeon: Candelaria Stagers, DPM;  Location: [facility];  Service: Podiatry;  Laterality: Right;   BICEPT TENODESIS Right 12/05/2022   Procedure: RIGHT BICEPS TENODESIS;  Surgeon: Cammy Copa, MD;  Location: [facility] OR;  Service: Orthopedics;  Laterality: Right;   CAPSULOTOMY METATARSOPHALANGEAL Right 06/05/2022   Procedure: CAPSULOTOMY METATARSOPHALANGEAL;  Surgeon: Candelaria Stagers, DPM;  Location: [facility];  Service: Podiatry;  Laterality: Right;   CHOLECYSTECTOMY     COLONOSCOPY     HALLUX VALGUS LAPIDUS Right 06/05/2022   Procedure: HALLUX VALGUS LAPIDUS;  Surgeon: Candelaria Stagers, DPM;  Location: [facility];  Service: Podiatry;  Laterality: Right;   HAMMER TOE SURGERY Right 06/05/2022   Procedure: HAMMER TOE CORRECTION;  Surgeon: Candelaria Stagers, DPM;  Location:  [facility];  Service: Podiatry;  Laterality: Right;   REVERSE SHOULDER ARTHROPLASTY Right 12/05/2022   Procedure: RIGHT REVERSE SHOULDER ARTHROPLASTY;  Surgeon: Cammy Copa, MD;  Location: [facility] OR;  Service: Orthopedics;  Laterality: Right;   SHOULDER ARTHROSCOPY WITH ROTATOR CUFF REPAIR AND SUBACROMIAL DECOMPRESSION Right 08/12/2020   Procedure: RIGHT SHOULDER ARTHROSCOPY WITH ROTATOR CUFF REPAIR AND SUBACROMIAL DECOMPRESSION;  Surgeon: Kathryne Hitch, MD;  Location: [facility];  Service: Orthopedics;  Laterality: Right;   TOTAL KNEE ARTHROPLASTY Right 12/24/2020   Procedure: RIGHT TOTAL KNEE ARTHROPLASTY;  Surgeon: Kathryne Hitch, MD;  Location: [facility];  Service: Orthopedics;  Laterality: Right;   TOTAL KNEE ARTHROPLASTY Left 07/15/2021   Procedure: LEFT TOTAL KNEE ARTHROPLASTY;  Surgeon: Kathryne Hitch, MD;  Location: [facility];  Service: Orthopedics;  Laterality: Left;   Patient Active Problem List   Diagnosis Date Noted   Arthritis of right shoulder region 12/24/2022   Biceps tendonitis on right 12/24/2022   S/P reverse total shoulder arthroplasty, right 12/05/2022   Vertigo 11/27/2022   Iron deficiency anemia 10/30/2022   Physical exam, annual 10/30/2022   Medication management 10/30/2022   Class 2 severe obesity due to excess calories with serious comorbidity in adult, unspecified BMI (HCC) 10/23/2022   Essential hypertension 10/23/2022   Prediabetes 10/23/2022  Hypothyroidism 10/23/2022   Insomnia 06/14/2022   Status post total left knee replacement 07/15/2021   Neoplasm of uncertain behavior of skin 05/25/2021   Generalized osteoarthritis of multiple sites 05/25/2021   Fibromyalgia syndrome 05/25/2021   Restless legs 03/16/2021   Hyperlipidemia 03/16/2021   Anxiety 03/16/2021   Status post right knee replacement 12/24/2020   Unilateral primary osteoarthritis, left knee 11/11/2020   Unilateral primary osteoarthritis,  right knee 11/11/2020   Complete tear of right rotator cuff 08/12/2020   Hypertensive disorder 05/05/2019   PCP: Park Meo, FNP  REFERRING PROVIDER: Cammy Copa, MD  REFERRING DIAG: History of arthroplasty of right shoulder [Z96.611]   THERAPY DIAG:  Chronic right shoulder pain  Muscle weakness (generalized)  Rationale for Evaluation and Treatment: Rehabilitation  ONSET DATE: 12/05/22 Rt RSA  SUBJECTIVE:                                                                                                                                                                                                         SUBJECTIVE STATEMENT:  "It's ok. No pain."  PERTINENT HISTORY:  PMHx includes anemia, anxiety/depression, fibromyalgia, HTN, hypothyroidism, vertigo  PAIN:  Are you having pain?None currently  NPRS scale: 4 at worst/10 Pain location: Rt shoulder Pain description: ache;hurt Aggravating factors: overactivity,movement Relieving factors: ice,medication   PRECAUTIONS: None    WEIGHT BEARING RESTRICTIONS: No    OBJECTIVE:   DIAGNOSTIC FINDINGS:   12/31/22 XR Shoulder Right   AP, Scap Y, axillary views right shoulder reviewed.  Right shoulder reverse shoulder prosthesis in good position alignment without any complicating features.  No change compared with prior radiographs from 1 to 2 weeks ago.  No fracture/dislocation.    PATIENT SURVEYS:  FOTO 40 current, 55 predicted  COGNITION: Overall cognitive status: Within functional limits for tasks assessed  SENSATION: WFL  POSTURE: rounded shoulders  PALPATION: Tenderness to palpation along lateral shoulder related to recent fall on 12/23/22. No significant post-op related tenderness to palpation.     UPPER EXTREMITY ROM:  Active ROM Right eval Left eval 01/18/23 Rt PROM  Shoulder flexion 140  140  Shoulder extension     Shoulder abduction 80  96  Shoulder adduction     Shoulder extension      Shoulder internal rotation 72    Shoulder external rotation 35  46  Elbow flexion     Elbow extension     Wrist flexion     Wrist extension     Wrist ulnar deviation     Wrist radial deviation  Wrist pronation     Wrist supination      (Blank rows = not tested)  R shoulder abduction improved by 10deg following manual PT today.   UPPER EXTREMITY MMT: not formally assess at eval d/t ROM deficits and post-op status  MMT Right eval Left eval  Shoulder flexion    Shoulder extension    Shoulder abduction    Shoulder adduction    Shoulder extension    Shoulder internal rotation    Shoulder external rotation    Middle trapezius    Lower trapezius    Elbow flexion    Elbow extension    Wrist flexion    Wrist extension    Wrist ulnar deviation    Wrist radial deviation    Wrist pronation    Wrist supination    Grip strength     (Blank rows = not tested)   TODAY'S TREATMENT:                                                                                                                              [facility] Adult PT Treatment:                                                DATE: 01/23/23 Therapeutic Exercise: Supine shoulder flexion AROM short lever x 10  Supine shoulder flexion AROM long lever x 10  Sidelying shoulder abduction AROM short lever x 10 Sidelying shoulder abduction AROM long lever x 10  Sidelying shoulder ER AROM x 10  Pulleys flexion x 2 minutes  Manual Therapy: Rt shoulder PROM ER, Abduction, flexion to tolerance     [facility] Adult PT Treatment:                                                DATE: 01/18/23 Therapeutic Exercise: Supine shoulder flexion AAROM with dowel x 10  Supine shoulder ER AAROM with dowel x 10  Scapular retraction 2 x10  Deltoid isometrics 2 x 10; 5 sec hold  Seated bicep curl 1# 2 x 10  Updated HEP  Manual Therapy: Rt shoulder PROM ER, Abduction, flexion to tolerance  Scar tissue mobilization    [facility] Adult PT Treatment:                                                 DATE: 01/16/23 Therapeutic Exercise: Supine shoulder flexion AAROM with dowel x 8  Supine shoulder ER AAROM with dowel x 8  Updated HEP  Manual Therapy: Rt shoulder flexion, abduction, ER PROM to tolerance  Self Care: Educated on post-operative precautions and surgical intervention Modalities for pain control   [facility] Adult PT Treatment:                                                DATE: 01/04/2023  Therapeutic Exercise: Seated pendulum  Created and reviewed initial HEP as noted below   Manual Therapy: PROM shoulder flexion, scaption, abduction   PATIENT EDUCATION:  Education details: HEP review Person educated: Patient Education method: Explanation Education comprehension: verbalized understanding  HOME EXERCISE PROGRAM: Access Code: [email or web address] URL: [email or web address]   ASSESSMENT:  CLINICAL IMPRESSION: Patient is 7 weeks s/p Rt RSA tolerating progression of shoulder ROM activity well. Introduced Rt shoulder flexion and abduction AROM in gravity minimized positioning with patient demonstrating good control and no signs of upper trap compensation. She did report fatigue with long lever flexion/abduction AROM, but no reports of pain.   OBJECTIVE IMPAIRMENTS: decreased activity tolerance, decreased endurance, decreased ROM, decreased strength, impaired UE functional use, postural dysfunction, and pain.   ACTIVITY LIMITATIONS: carrying, lifting, sleeping, bathing, dressing, reach over head, and hygiene/grooming  PARTICIPATION LIMITATIONS: meal prep, cleaning, driving, shopping, community activity, and yard work  PERSONAL FACTORS: Past/current experiences and 1-2 comorbidities: PMHx includes anemia, anxiety/depression, fibromyalgia, HTN, hypothyroidism, vertigo  are also affecting patient's functional outcome.   REHAB POTENTIAL: Good  CLINICAL DECISION MAKING: Evolving/moderate complexity  EVALUATION COMPLEXITY:  Moderate   GOALS: Goals reviewed with patient? Yes  SHORT TERM GOALS: Target date: 02/01/2023    Patient will be independent with initial home program for Rt shoulder ROM.  Baseline: provided at eval Goal status: met  2.  Patient will demonstrate improved AROM by 10 degrees in shoulder flexion/abduction.  Baseline: see objective findings Goal status: INITIAL    LONG TERM GOALS: Target date: 03/01/2023    Patient will report improved overall functional ability with FOTO score of 55 or greater.  Baseline: 40 Goal status: INITIAL  2.  Patient will report 2/10 or less with ADLs including don/doff bra and washing her back.  Baseline: difficulty related to pain and limited AROM  Goal status: INITIAL  3.  Patient will demonstrate at least 4/5 R UE strength in all directions Baseline: see objective findings  Goal status: INITIAL  4.  Patient will report ability to return to driving 20 minutes or greater without exacerbation of pain. Baseline: limited to short distance and unable to use R arm as normal when turning.  Goal status: INITIAL  5.  Patient will report improved sleep quality without waking d/t R shoulder pain.  Baseline: walking d/t BIL shoulder pain throughout the night Goal status: INITIAL  6.  Patient will demonstrate ability to push/pull at least 40# with <3/10 pain in order to return to heavy household and yardwork tasks.  Baseline: unable to sweep/vacuum/push the lawnmower Goal status: INITIAL   PLAN:  PT FREQUENCY: 2x/week  PT DURATION: 8 weeks  PLANNED INTERVENTIONS: Therapeutic exercises, Therapeutic activity, Neuromuscular re-education, Patient/Family education, Self Care, Joint mobilization, Electrical stimulation, Cryotherapy, Moist heat, Manual therapy, and Re-evaluation  PLAN FOR NEXT SESSION: PROM, AAROM->AROM progression per post op status, manual therapy and pain modulation activities as needed.   Letitia Libra, PT, DPT, ATC 01/23/23 9:25 AM

## 2023-01-24 ENCOUNTER — Ambulatory Visit: Payer: Self-pay | Admitting: Surgery

## 2023-01-24 NOTE — H&P (Signed)
Glean Salen [id number]   Referring Provider:  Elease Etienne*   Subjective   Chief Complaint: New Consultation (Paraesophageal hernia)     History of Present Illness:    62 year old woman with history of anemia, anxiety, arthritis, depression, fibromyalgia, GERD and hiatal hernia, hyperlipidemia, hypertension, hypothyroidism, melanoma and previous abdominal surgeries including total abdominal hysterectomy, open cholecystectomy, who presents for evaluation of a paraesophageal hernia. She has a history of iron deficiency anemia for which she underwent upper and lower endoscopy by Dr. Tomasa Rand earlier this month.  EGD noted a benign-appearing intrinsic mild stenosis at the GE junction which was traversed and dilated up to 20 mm; a 6 cm paraesophageal hernia with the GE junction measuring at 29 cm and the hiatus narrowing measured at 35 cm from the incisors. Colonoscopy findings included diverticulosis, polypoid mucosa at the appendiceal orifice.  Path was all benign, negative for H. pylori or any dysplasia.  She states that the hiatal hernia has been known for quite some time, but was noted to have increased in size on this most recent study.  Her primary symptom is indigestion and heartburn.  Has noted some pill dysphagia but no solid food dysphagia, and has noted nocturnal regurgitation with severe burning in the thoracic esophageal region as well as subsequent intermittent raspy cough.  She has been started on antireflux medications and has noted some improvement in this but is still having some breakthrough symptoms.   She also notes lower abdominal pain and a "bubbling" sensation.   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    Review of Systems: A complete review of systems was obtained from the patient.  I have reviewed this information and discussed as appropriate  with the patient.  See HPI as well for other ROS.   Medical History: Past Medical History:  Diagnosis Date   Anemia    Anxiety    Arthritis    GERD (gastroesophageal reflux disease)    History of cancer    Hyperlipidemia    Hypertension     There is no problem list on file for this patient.   Past Surgical History:  Procedure Laterality Date   knee replacement surgery     rotator cuff surgery     shoulder surgery       No Known Allergies  Current Outpatient Medications on File Prior to Visit  Medication Sig Dispense Refill   acetaminophen (TYLENOL) 500 MG tablet Take by mouth     celecoxib (CELEBREX) 200 MG capsule Take 1 capsule by mouth 2 (two) times daily as needed     cholecalciferol, vitD3,/vit K2 (VITAMIN D3-VITAMIN K2 ORAL) Take by mouth     dicyclomine (BENTYL) 10 mg capsule TAKE 1 CAPSULE BY MOUTH 4 TIMES DAILY AS NEEDED     escitalopram oxalate (LEXAPRO) 20 MG tablet Take 1 tablet by mouth once daily     famotidine (PEPCID) 40 MG tablet Take 40 mg by mouth at bedtime     ferrous sulfate 325 (65 FE) MG tablet Take 1 tablet by mouth once daily     krill oil 500 mg Cap Take 500 mg by mouth once daily     levothyroxine (SYNTHROID) 50 MCG tablet Take 50 mcg by mouth every morning before breakfast (0630)     lisinopriL-hydroCHLOROthiazide (ZESTORETIC) 10-12.5 mg tablet Take 1 tablet by mouth once daily  pantoprazole (PROTONIX) 40 MG DR tablet Take 40 mg by mouth 2 (two) times daily before meals     pravastatin (PRAVACHOL) 40 MG tablet Take 1 tablet by mouth once daily     pregabalin (LYRICA) 75 MG capsule Take 75 mg by mouth 2 (two) times daily     QUEtiapine (SEROQUEL) 50 MG tablet Take 1 tablet by mouth at bedtime     TURMERIC ORAL Take 1,000 mg by mouth once daily     zolpidem (AMBIEN) 5 MG tablet Take 1 tablet by mouth once daily     No current facility-administered medications on file prior to visit.    Family History  Problem Relation Age of Onset    Stroke Mother    High blood pressure (Hypertension) Mother    Hyperlipidemia (Elevated cholesterol) Mother    Skin cancer Father    Coronary Artery Disease (Blocked arteries around heart) Father    Diabetes Father    Obesity Sister    High blood pressure (Hypertension) Sister    Diabetes Sister      Social History   Tobacco Use  Smoking Status Never  Smokeless Tobacco Never     Social History   Socioeconomic History   Marital status: Married  Tobacco Use   Smoking status: Never   Smokeless tobacco: Never  Vaping Use   Vaping status: Never Used  Substance and Sexual Activity   Alcohol use: Not Currently   Drug use: Never   Social Determinants of Health    Received from [employer]   Social Network    Objective:    Vitals:   01/24/23 1457  BP: 122/80  Pulse: 52  Temp: 36.7 C (98 F)  SpO2: 97%  Weight: 78.4 kg (172 lb 12.8 oz)  Height: 147.3 cm (4\' 10" )  PainSc: 0-No pain    Body mass index is 36.12 kg/m.  Gen: A&Ox3, no distress  Unlabored respirations Abdomen soft/nontender/nondistended.   Assessment and Plan:  Diagnoses and all orders for this visit:  Paraesophageal hernia    We discussed the relevant anatomy using a diagram to demonstrate and went over options for treatment.  She is already on proton pump inhibitors and at least at 1 point was on H2 blockers, and while this has afforded some good symptomatic relief she is still having some breakthrough symptoms.  Discussed that nonoperative management is acceptable in patients who symptoms are well-controlled with medication or who are asymptomatic however a large percentage of these patients do progress in terms of size of the hernia and there is a small risk of gastric outlet obstruction or volvulus with this.  We also discussed robotic repair with fundoplication and went over the technique in detail.  We discussed risks of bleeding, infection, pain, scarring, injury to intra-abdominal or  mediastinal structures, failure to completely resolve reflux symptoms, recurrence of hiatal hernia which happens in at least 15% but likely more patients, dehiscence or slip of the fundoplication, chronic dysphagia, gas bloat or poor gastric emptying which may be permanent, potential need for gastropexy and gastrostomy tube if there is significant esophageal foreshortening that prohibits safe fundoplication, as well as risks of cardiovascular/ pulmonary/ thromboembolic complications.  We discussed the postoperative recovery timeline and briefly went over dietary changes at least in the early postoperative period.  I did advise her that this is unlikely to have any effect on her lower abdominal pain/symptoms.  Questions were welcomed and answered to her satisfaction.  She would like to proceed with  surgery.  Kalissa Grays Carlye Grippe, MD

## 2023-01-25 ENCOUNTER — Ambulatory Visit: Payer: Medicaid Other | Attending: Orthopedic Surgery

## 2023-01-25 DIAGNOSIS — M25511 Pain in right shoulder: Secondary | ICD-10-CM | POA: Diagnosis present

## 2023-01-25 DIAGNOSIS — G8929 Other chronic pain: Secondary | ICD-10-CM | POA: Insufficient documentation

## 2023-01-25 DIAGNOSIS — M6281 Muscle weakness (generalized): Secondary | ICD-10-CM | POA: Insufficient documentation

## 2023-01-25 NOTE — Therapy (Signed)
OUTPATIENT PHYSICAL THERAPY TREATMENT   Patient Name: Denise Morales MRN: [id number] DOB:[birth year], 62 y.o., female Today's Date: 01/25/2023  END OF SESSION:  PT End of Session - 01/25/23 0930     Visit Number 5    Number of Visits 17    Date for PT Re-Evaluation 03/01/23    Authorization Type [employer]    Authorization Time Period 7/22-10/20/24    Authorization - Visit Number 4    Authorization - Number of Visits 15    PT Start Time 0930    PT Stop Time 1010    PT Time Calculation (min) 40 min    Activity Tolerance Patient tolerated treatment well    Behavior During Therapy WFL for tasks assessed/performed             Past Medical History:  Diagnosis Date   Allergy    Anemia    Anxiety    Arthritis    Depression    Fibromyalgia    GERD (gastroesophageal reflux disease)    History of hiatal hernia    Hyperlipidemia    Hypertension    Hypothyroidism    Melanoma (HCC)    face   Thyroid disease    Past Surgical History:  Procedure Laterality Date   ABDOMINAL HYSTERECTOMY  1985   AIKEN OSTEOTOMY Right 06/05/2022   Procedure: Quintella Reichert OSTEOTOMY;  Surgeon: Candelaria Stagers, DPM;  Location: [facility];  Service: Podiatry;  Laterality: Right;   BICEPT TENODESIS Right 12/05/2022   Procedure: RIGHT BICEPS TENODESIS;  Surgeon: Cammy Copa, MD;  Location: [facility] OR;  Service: Orthopedics;  Laterality: Right;   CAPSULOTOMY METATARSOPHALANGEAL Right 06/05/2022   Procedure: CAPSULOTOMY METATARSOPHALANGEAL;  Surgeon: Candelaria Stagers, DPM;  Location: [facility];  Service: Podiatry;  Laterality: Right;   CHOLECYSTECTOMY     COLONOSCOPY     HALLUX VALGUS LAPIDUS Right 06/05/2022   Procedure: HALLUX VALGUS LAPIDUS;  Surgeon: Candelaria Stagers, DPM;  Location: [facility];  Service: Podiatry;  Laterality: Right;   HAMMER TOE SURGERY Right 06/05/2022   Procedure: HAMMER TOE CORRECTION;  Surgeon: Candelaria Stagers, DPM;  Location:  [facility];  Service: Podiatry;  Laterality: Right;   REVERSE SHOULDER ARTHROPLASTY Right 12/05/2022   Procedure: RIGHT REVERSE SHOULDER ARTHROPLASTY;  Surgeon: Cammy Copa, MD;  Location: [facility] OR;  Service: Orthopedics;  Laterality: Right;   SHOULDER ARTHROSCOPY WITH ROTATOR CUFF REPAIR AND SUBACROMIAL DECOMPRESSION Right 08/12/2020   Procedure: RIGHT SHOULDER ARTHROSCOPY WITH ROTATOR CUFF REPAIR AND SUBACROMIAL DECOMPRESSION;  Surgeon: Kathryne Hitch, MD;  Location: [facility];  Service: Orthopedics;  Laterality: Right;   TOTAL KNEE ARTHROPLASTY Right 12/24/2020   Procedure: RIGHT TOTAL KNEE ARTHROPLASTY;  Surgeon: Kathryne Hitch, MD;  Location: [facility];  Service: Orthopedics;  Laterality: Right;   TOTAL KNEE ARTHROPLASTY Left 07/15/2021   Procedure: LEFT TOTAL KNEE ARTHROPLASTY;  Surgeon: Kathryne Hitch, MD;  Location: [facility];  Service: Orthopedics;  Laterality: Left;   Patient Active Problem List   Diagnosis Date Noted   Arthritis of right shoulder region 12/24/2022   Biceps tendonitis on right 12/24/2022   S/P reverse total shoulder arthroplasty, right 12/05/2022   Vertigo 11/27/2022   Iron deficiency anemia 10/30/2022   Physical exam, annual 10/30/2022   Medication management 10/30/2022   Class 2 severe obesity due to excess calories with serious comorbidity in adult, unspecified BMI (HCC) 10/23/2022   Essential hypertension 10/23/2022   Prediabetes 10/23/2022  Hypothyroidism 10/23/2022   Insomnia 06/14/2022   Status post total left knee replacement 07/15/2021   Neoplasm of uncertain behavior of skin 05/25/2021   Generalized osteoarthritis of multiple sites 05/25/2021   Fibromyalgia syndrome 05/25/2021   Restless legs 03/16/2021   Hyperlipidemia 03/16/2021   Anxiety 03/16/2021   Status post right knee replacement 12/24/2020   Unilateral primary osteoarthritis, left knee 11/11/2020   Unilateral primary osteoarthritis,  right knee 11/11/2020   Complete tear of right rotator cuff 08/12/2020   Hypertensive disorder 05/05/2019   PCP: Park Meo, FNP  REFERRING PROVIDER: Cammy Copa, MD  REFERRING DIAG: History of arthroplasty of right shoulder [Z96.611]   THERAPY DIAG:  Chronic right shoulder pain  Muscle weakness (generalized)  Rationale for Evaluation and Treatment: Rehabilitation  ONSET DATE: 12/05/22 Rt RSA  SUBJECTIVE:                                                                                                                                                                                                         SUBJECTIVE STATEMENT:  "Good. No pain."  PERTINENT HISTORY:  PMHx includes anemia, anxiety/depression, fibromyalgia, HTN, hypothyroidism, vertigo  PAIN:  Are you having pain?None currently  NPRS scale: 4 at worst/10 Pain location: Rt shoulder Pain description: ache;hurt Aggravating factors: overactivity,movement Relieving factors: ice,medication   PRECAUTIONS: None    WEIGHT BEARING RESTRICTIONS: No    OBJECTIVE:   DIAGNOSTIC FINDINGS:   12/31/22 XR Shoulder Right   AP, Scap Y, axillary views right shoulder reviewed.  Right shoulder reverse shoulder prosthesis in good position alignment without any complicating features.  No change compared with prior radiographs from 1 to 2 weeks ago.  No fracture/dislocation.    PATIENT SURVEYS:  FOTO 40 current, 55 predicted  COGNITION: Overall cognitive status: Within functional limits for tasks assessed  SENSATION: WFL  POSTURE: rounded shoulders  PALPATION: Tenderness to palpation along lateral shoulder related to recent fall on 12/23/22. No significant post-op related tenderness to palpation.     UPPER EXTREMITY ROM:  Active ROM Right eval Left eval 01/18/23 Rt PROM 01/25/23 R  Shoulder flexion 140  140 AROM: 150   Shoulder extension      Shoulder abduction 80  96   Shoulder adduction       Shoulder extension      Shoulder internal rotation 72     Shoulder external rotation 35  46   Elbow flexion      Elbow extension      Wrist flexion      Wrist extension  Wrist ulnar deviation      Wrist radial deviation      Wrist pronation      Wrist supination       (Blank rows = not tested)  R shoulder abduction improved by 10deg following manual PT today.   UPPER EXTREMITY MMT: not formally assess at eval d/t ROM deficits and post-op status  MMT Right eval Left eval  Shoulder flexion    Shoulder extension    Shoulder abduction    Shoulder adduction    Shoulder extension    Shoulder internal rotation    Shoulder external rotation    Middle trapezius    Lower trapezius    Elbow flexion    Elbow extension    Wrist flexion    Wrist extension    Wrist ulnar deviation    Wrist radial deviation    Wrist pronation    Wrist supination    Grip strength     (Blank rows = not tested)   TODAY'S TREATMENT:                                                                                                                              [facility] Adult PT Treatment:                                                DATE: 01/25/23 Therapeutic Exercise: Supine shoulder flexion AROM long lever x 10  Sidelying shoulder abduction AROM long lever x 10  Sidelying shoulder ER x 10  Standing flexion towel slides x 10  Standing ER AAROM in standing with dowel x 10  Standing abduction ER AAROM in standing with dowel x 10  Updated HEP  Manual Therapy: Rt shoulder PROM to tolerance     [facility] Adult PT Treatment:                                                DATE: 01/23/23 Therapeutic Exercise: Supine shoulder flexion AROM short lever x 10  Supine shoulder flexion AROM long lever x 10  Sidelying shoulder abduction AROM short lever x 10 Sidelying shoulder abduction AROM long lever x 10  Sidelying shoulder ER AROM x 10  Pulleys flexion x 2 minutes  Manual Therapy: Rt shoulder PROM ER,  Abduction, flexion to tolerance     [facility] Adult PT Treatment:                                                DATE: 01/18/23 Therapeutic Exercise: Supine shoulder flexion AAROM with dowel x  10  Supine shoulder ER AAROM with dowel x 10  Scapular retraction 2 x10  Deltoid isometrics 2 x 10; 5 sec hold  Seated bicep curl 1# 2 x 10  Updated HEP  Manual Therapy: Rt shoulder PROM ER, Abduction, flexion to tolerance  Scar tissue mobilization    [facility] Adult PT Treatment:                                                DATE: 01/16/23 Therapeutic Exercise: Supine shoulder flexion AAROM with dowel x 8  Supine shoulder ER AAROM with dowel x 8  Updated HEP  Manual Therapy: Rt shoulder flexion, abduction, ER PROM to tolerance   Self Care: Educated on post-operative precautions and surgical intervention Modalities for pain control   PATIENT EDUCATION:  Education details: HEP update Person educated: Patient Education method: Programmer, multimedia, demo, cues, handout Education comprehension: verbalized understanding, returned demo, cues   HOME EXERCISE PROGRAM: Access Code: [email or web address] URL: [email or web address]   ASSESSMENT:  CLINICAL IMPRESSION: Patient tolerated session well today with continued progression of Rt shoulder ROM activity. No reports of pain with passive flexion and ER, but does report mild pain at available end range of abduction and minimal IR ROM. No signs of shoulder shrug with gravity eliminated ROM activity, but does have difficulty controlling shoulder shrug with standing abduction AAROM. HEP was updated to include AROM activity.   OBJECTIVE IMPAIRMENTS: decreased activity tolerance, decreased endurance, decreased ROM, decreased strength, impaired UE functional use, postural dysfunction, and pain.   ACTIVITY LIMITATIONS: carrying, lifting, sleeping, bathing, dressing, reach over head, and hygiene/grooming  PARTICIPATION LIMITATIONS: meal prep, cleaning, driving,  shopping, community activity, and yard work  PERSONAL FACTORS: Past/current experiences and 1-2 comorbidities: PMHx includes anemia, anxiety/depression, fibromyalgia, HTN, hypothyroidism, vertigo  are also affecting patient's functional outcome.   REHAB POTENTIAL: Good  CLINICAL DECISION MAKING: Evolving/moderate complexity  EVALUATION COMPLEXITY: Moderate   GOALS: Goals reviewed with patient? Yes  SHORT TERM GOALS: Target date: 02/01/2023    Patient will be independent with initial home program for Rt shoulder ROM.  Baseline: provided at eval Goal status: met  2.  Patient will demonstrate improved AROM by 10 degrees in shoulder flexion/abduction.  Baseline: see objective findings Goal status: partially met     LONG TERM GOALS: Target date: 03/01/2023    Patient will report improved overall functional ability with FOTO score of 55 or greater.  Baseline: 40 Goal status: INITIAL  2.  Patient will report 2/10 or less with ADLs including don/doff bra and washing her back.  Baseline: difficulty related to pain and limited AROM  Goal status: INITIAL  3.  Patient will demonstrate at least 4/5 R UE strength in all directions Baseline: see objective findings  Goal status: INITIAL  4.  Patient will report ability to return to driving 20 minutes or greater without exacerbation of pain. Baseline: limited to short distance and unable to use R arm as normal when turning.  Goal status: INITIAL  5.  Patient will report improved sleep quality without waking d/t R shoulder pain.  Baseline: walking d/t BIL shoulder pain throughout the night Goal status: INITIAL  6.  Patient will demonstrate ability to push/pull at least 40# with <3/10 pain in order to return to heavy household and yardwork tasks.  Baseline: unable to sweep/vacuum/push the lawnmower Goal status: INITIAL   PLAN:  PT FREQUENCY: 2x/week  PT DURATION: 8 weeks  PLANNED INTERVENTIONS: Therapeutic exercises, Therapeutic  activity, Neuromuscular re-education, Patient/Family education, Self Care, Joint mobilization, Electrical stimulation, Cryotherapy, Moist heat, Manual therapy, and Re-evaluation  PLAN FOR NEXT SESSION: PROM, AAROM->AROM progression per post op status, manual therapy and pain modulation activities as needed.   Letitia Libra, PT, DPT, ATC 01/25/23 10:10 AM

## 2023-01-30 ENCOUNTER — Ambulatory Visit: Payer: Medicaid Other

## 2023-01-30 DIAGNOSIS — M25511 Pain in right shoulder: Secondary | ICD-10-CM | POA: Diagnosis not present

## 2023-01-30 DIAGNOSIS — M6281 Muscle weakness (generalized): Secondary | ICD-10-CM

## 2023-01-30 DIAGNOSIS — G8929 Other chronic pain: Secondary | ICD-10-CM

## 2023-01-30 NOTE — Therapy (Signed)
OUTPATIENT PHYSICAL THERAPY TREATMENT   Patient Name: Denise Morales MRN: [id number] DOB:Jan 30, 1961, 62 y.o., female Today's Date: 01/30/2023  END OF SESSION:  PT End of Session - 01/30/23 0841     Visit Number 6    Number of Visits 17    Date for PT Re-Evaluation 03/01/23    Authorization Type [employer]    Authorization Time Period 7/22-10/20/24    Authorization - Visit Number 5    Authorization - Number of Visits 15    PT Start Time 0841    PT Stop Time 0923    PT Time Calculation (min) 42 min    Activity Tolerance Patient tolerated treatment well    Behavior During Therapy [facility] for tasks assessed/performed              Past Medical History:  Diagnosis Date   Allergy    Anemia    Anxiety    Arthritis    Depression    Fibromyalgia    GERD (gastroesophageal reflux disease)    History of hiatal hernia    Hyperlipidemia    Hypertension    Hypothyroidism    Melanoma (HCC)    face   Thyroid disease    Past Surgical History:  Procedure Laterality Date   ABDOMINAL HYSTERECTOMY  1985   AIKEN OSTEOTOMY Right 06/05/2022   Procedure: Quintella Reichert OSTEOTOMY;  Surgeon: Candelaria Stagers, DPM;  Location: [facility];  Service: Podiatry;  Laterality: Right;   BICEPT TENODESIS Right 12/05/2022   Procedure: RIGHT BICEPS TENODESIS;  Surgeon: Cammy Copa, MD;  Location: [facility];  Service: Orthopedics;  Laterality: Right;   CAPSULOTOMY METATARSOPHALANGEAL Right 06/05/2022   Procedure: CAPSULOTOMY METATARSOPHALANGEAL;  Surgeon: Candelaria Stagers, DPM;  Location: [facility];  Service: Podiatry;  Laterality: Right;   CHOLECYSTECTOMY     COLONOSCOPY     HALLUX VALGUS LAPIDUS Right 06/05/2022   Procedure: HALLUX VALGUS LAPIDUS;  Surgeon: Candelaria Stagers, DPM;  Location: [facility];  Service: Podiatry;  Laterality: Right;   HAMMER TOE SURGERY Right 06/05/2022   Procedure: HAMMER TOE CORRECTION;  Surgeon: Candelaria Stagers, DPM;  Location:  [facility] ;  Service: Podiatry;  Laterality: Right;   REVERSE SHOULDER ARTHROPLASTY Right 12/05/2022   Procedure: RIGHT REVERSE SHOULDER ARTHROPLASTY;  Surgeon: Cammy Copa, MD;  Location: [facility];  Service: Orthopedics;  Laterality: Right;   SHOULDER ARTHROSCOPY WITH ROTATOR CUFF REPAIR AND SUBACROMIAL DECOMPRESSION Right 08/12/2020   Procedure: RIGHT SHOULDER ARTHROSCOPY WITH ROTATOR CUFF REPAIR AND SUBACROMIAL DECOMPRESSION;  Surgeon: Kathryne Hitch, MD;  Location: [facility];  Service: Orthopedics;  Laterality: Right;   TOTAL KNEE ARTHROPLASTY Right 12/24/2020   Procedure: RIGHT TOTAL KNEE ARTHROPLASTY;  Surgeon: Kathryne Hitch, MD;  Location: [facility];  Service: Orthopedics;  Laterality: Right;   TOTAL KNEE ARTHROPLASTY Left 07/15/2021   Procedure: LEFT TOTAL KNEE ARTHROPLASTY;  Surgeon: Kathryne Hitch, MD;  Location: [facility];  Service: Orthopedics;  Laterality: Left;   Patient Active Problem List   Diagnosis Date Noted   Arthritis of right shoulder region 12/24/2022   Biceps tendonitis on right 12/24/2022   S/P reverse total shoulder arthroplasty, right 12/05/2022   Vertigo 11/27/2022   Iron deficiency anemia 10/30/2022   Physical exam, annual 10/30/2022   Medication management 10/30/2022   Class 2 severe obesity due to excess calories with serious comorbidity in adult, unspecified BMI (HCC) 10/23/2022   Essential hypertension 10/23/2022   Prediabetes  10/23/2022   Hypothyroidism 10/23/2022   Insomnia 06/14/2022   Status post total left knee replacement 07/15/2021   Neoplasm of uncertain behavior of skin 05/25/2021   Generalized osteoarthritis of multiple sites 05/25/2021   Fibromyalgia syndrome 05/25/2021   Restless legs 03/16/2021   Hyperlipidemia 03/16/2021   Anxiety 03/16/2021   Status post right knee replacement 12/24/2020   Unilateral primary osteoarthritis, left knee 11/11/2020   Unilateral primary osteoarthritis,  right knee 11/11/2020   Complete tear of right rotator cuff 08/12/2020   Hypertensive disorder 05/05/2019   PCP: Park Meo, FNP  REFERRING PROVIDER: Cammy Copa, MD  REFERRING DIAG: History of arthroplasty of right shoulder [Z96.611]   THERAPY DIAG:  Chronic right shoulder pain  Muscle weakness (generalized)  Rationale for Evaluation and Treatment: Rehabilitation  ONSET DATE: 12/05/22 Rt RSA  SUBJECTIVE:                                                                                                                                                                                                         SUBJECTIVE STATEMENT:  "It's ok. No pain right now." She did a lot of cleaning Saturday and the shoulder was hurting on Sunday. She is scheduled for a hernia repair on 9/16.   PERTINENT HISTORY:  PMHx includes anemia, anxiety/depression, fibromyalgia, HTN, hypothyroidism, vertigo  PAIN:  Are you having pain?None currently  NPRS scale: 6 at worst/10 Pain location: Rt shoulder Pain description: ache;hurt Aggravating factors: overactivity,movement Relieving factors: ice,medication   PRECAUTIONS: None    WEIGHT BEARING RESTRICTIONS: No    OBJECTIVE:   DIAGNOSTIC FINDINGS:   12/31/22 XR Shoulder Right   AP, Scap Y, axillary views right shoulder reviewed.  Right shoulder reverse shoulder prosthesis in good position alignment without any complicating features.  No change compared with prior radiographs from 1 to 2 weeks ago.  No fracture/dislocation.    PATIENT SURVEYS:  FOTO 40 current, 55 predicted  COGNITION: Overall cognitive status: Within functional limits for tasks assessed  SENSATION: WFL  POSTURE: rounded shoulders  PALPATION: Tenderness to palpation along lateral shoulder related to recent fall on 12/23/22. No significant post-op related tenderness to palpation.     UPPER EXTREMITY ROM:  Active ROM Right eval Left eval 01/18/23 Rt  PROM 01/25/23 R 01/30/23 Rt   Shoulder flexion 140  140 AROM: 150  AROM: 156 supine   Shoulder extension       Shoulder abduction 80  96  AROM: 111 supine   Shoulder adduction       Shoulder extension  Shoulder internal rotation 72      Shoulder external rotation 35  46    Elbow flexion       Elbow extension       Wrist flexion       Wrist extension       Wrist ulnar deviation       Wrist radial deviation       Wrist pronation       Wrist supination        (Blank rows = not tested)  R shoulder abduction improved by 10deg following manual PT today.   UPPER EXTREMITY MMT: not formally assess at eval d/t ROM deficits and post-op status  MMT Right eval Left eval  Shoulder flexion    Shoulder extension    Shoulder abduction    Shoulder adduction    Shoulder extension    Shoulder internal rotation    Shoulder external rotation    Middle trapezius    Lower trapezius    Elbow flexion    Elbow extension    Wrist flexion    Wrist extension    Wrist ulnar deviation    Wrist radial deviation    Wrist pronation    Wrist supination    Grip strength     (Blank rows = not tested)   TODAY'S TREATMENT:                                                                                                                              [facility] Adult PT Treatment:                                                DATE: 01/30/23 Therapeutic Exercise: Supine shoulder flexion AROM long lever x 10  Supine shoulder abduction AROM long lever x 10  Sidelying shoulder ER AROM x 10  Sidelying shoulder abduction AROM x 10 Supine serratus punch 2 x 10   Inclined shoulder flexion AROM  Pulleys flexion x 2 minute  Resisted rows red band 2 x 10  Seated shoulder abduction AROM short lever x 10  Manual Therapy: Rt shoulder PROM to tolerance flexion, abduction, IR, ER    [facility] Adult PT Treatment:                                                DATE: 01/25/23 Therapeutic Exercise: Supine shoulder flexion  AROM long lever x 10  Sidelying shoulder abduction AROM long lever x 10  Sidelying shoulder ER x 10  Standing flexion towel slides x 10  Standing ER AAROM in standing with dowel x 10  Standing abduction ER AAROM in standing with dowel x 10  Updated HEP  Manual Therapy: Rt shoulder PROM to tolerance     [facility] Adult PT Treatment:                                                DATE: 01/23/23 Therapeutic Exercise: Supine shoulder flexion AROM short lever x 10  Supine shoulder flexion AROM long lever x 10  Sidelying shoulder abduction AROM short lever x 10 Sidelying shoulder abduction AROM long lever x 10  Sidelying shoulder ER AROM x 10  Pulleys flexion x 2 minutes  Manual Therapy: Rt shoulder PROM ER, Abduction, flexion to tolerance    PATIENT EDUCATION:  Education details: HEP review Person educated: Patient Education method: Explanation,  Education comprehension: verbalized understanding  HOME EXERCISE PROGRAM: Access Code: [email or web address] URL: [email or web address]   ASSESSMENT:  CLINICAL IMPRESSION: Patient is making steady progress 8 weeks s/p Rt RSA. Flexion and abduction AROM continue to gradually improve, having met the short term functional goal. Able to progress AROM into more gravity dependent positioning today with good tolerance. Minimal cues required to reduce shoulder shrug with AROM activity. No pain reported at conclusion of session, but does report shoulder fatigue.   OBJECTIVE IMPAIRMENTS: decreased activity tolerance, decreased endurance, decreased ROM, decreased strength, impaired UE functional use, postural dysfunction, and pain.   ACTIVITY LIMITATIONS: carrying, lifting, sleeping, bathing, dressing, reach over head, and hygiene/grooming  PARTICIPATION LIMITATIONS: meal prep, cleaning, driving, shopping, community activity, and yard work  PERSONAL FACTORS: Past/current experiences and 1-2 comorbidities: PMHx includes anemia, anxiety/depression,  fibromyalgia, HTN, hypothyroidism, vertigo  are also affecting patient's functional outcome.   REHAB POTENTIAL: Good  CLINICAL DECISION MAKING: Evolving/moderate complexity  EVALUATION COMPLEXITY: Moderate   GOALS: Goals reviewed with patient? Yes  SHORT TERM GOALS: Target date: 02/01/2023    Patient will be independent with initial home program for Rt shoulder ROM.  Baseline: provided at eval Goal status: met  2.  Patient will demonstrate improved AROM by 10 degrees in shoulder flexion/abduction.  Baseline: see objective findings Goal status: met    LONG TERM GOALS: Target date: 03/01/2023    Patient will report improved overall functional ability with FOTO score of 55 or greater.  Baseline: 40 Goal status: INITIAL  2.  Patient will report 2/10 or less with ADLs including don/doff bra and washing her back.  Baseline: difficulty related to pain and limited AROM  Goal status: INITIAL  3.  Patient will demonstrate at least 4/5 R UE strength in all directions Baseline: see objective findings  Goal status: INITIAL  4.  Patient will report ability to return to driving 20 minutes or greater without exacerbation of pain. Baseline: limited to short distance and unable to use R arm as normal when turning.  Goal status: INITIAL  5.  Patient will report improved sleep quality without waking d/t R shoulder pain.  Baseline: walking d/t BIL shoulder pain throughout the night Goal status: INITIAL  6.  Patient will demonstrate ability to push/pull at least 40# with <3/10 pain in order to return to heavy household and yardwork tasks.  Baseline: unable to sweep/vacuum/push the lawnmower Goal status: INITIAL   PLAN:  PT FREQUENCY: 2x/week  PT DURATION: 8 weeks  PLANNED INTERVENTIONS: Therapeutic exercises, Therapeutic activity, Neuromuscular re-education, Patient/Family education, Self Care, Joint mobilization, Electrical stimulation, Cryotherapy, Moist heat, Manual therapy, and  Re-evaluation  PLAN FOR NEXT SESSION: PROM, AAROM->AROM progression per  post op status, manual therapy and pain modulation activities as needed.   Letitia Libra, PT, DPT, ATC 01/30/23 9:23 AM

## 2023-02-01 ENCOUNTER — Ambulatory Visit: Payer: Medicaid Other

## 2023-02-01 DIAGNOSIS — M25511 Pain in right shoulder: Secondary | ICD-10-CM | POA: Diagnosis not present

## 2023-02-01 DIAGNOSIS — G8929 Other chronic pain: Secondary | ICD-10-CM

## 2023-02-01 DIAGNOSIS — M6281 Muscle weakness (generalized): Secondary | ICD-10-CM

## 2023-02-01 NOTE — Therapy (Signed)
OUTPATIENT PHYSICAL THERAPY TREATMENT   Patient Name: Denise Morales MRN: [id number] DOB:Sep 02, 1960, 62 y.o., female Today's Date: 02/01/2023  END OF SESSION:  PT End of Session - 02/01/23 0927     Visit Number 7    Number of Visits 17    Date for PT Re-Evaluation 03/01/23    Authorization Type [employer]    Authorization Time Period 7/22-10/20/24    Authorization - Visit Number 6    Authorization - Number of Visits 15    PT Start Time 0929    PT Stop Time 1010    PT Time Calculation (min) 41 min    Activity Tolerance Patient tolerated treatment well    Behavior During Therapy WFL for tasks assessed/performed              Past Medical History:  Diagnosis Date   Allergy    Anemia    Anxiety    Arthritis    Depression    Fibromyalgia    GERD (gastroesophageal reflux disease)    History of hiatal hernia    Hyperlipidemia    Hypertension    Hypothyroidism    Melanoma (HCC)    face   Thyroid disease    Past Surgical History:  Procedure Laterality Date   ABDOMINAL HYSTERECTOMY  1985   AIKEN OSTEOTOMY Right 06/05/2022   Procedure: Quintella Reichert OSTEOTOMY;  Surgeon: Candelaria Stagers, DPM;  Location: [facility];  Service: Podiatry;  Laterality: Right;   BICEPT TENODESIS Right 12/05/2022   Procedure: RIGHT BICEPS TENODESIS;  Surgeon: Cammy Copa, MD;  Location: [facility] OR;  Service: Orthopedics;  Laterality: Right;   CAPSULOTOMY METATARSOPHALANGEAL Right 06/05/2022   Procedure: CAPSULOTOMY METATARSOPHALANGEAL;  Surgeon: Candelaria Stagers, DPM;  Location: [facility];  Service: Podiatry;  Laterality: Right;   CHOLECYSTECTOMY     COLONOSCOPY     HALLUX VALGUS LAPIDUS Right 06/05/2022   Procedure: HALLUX VALGUS LAPIDUS;  Surgeon: Candelaria Stagers, DPM;  Location: [facility];  Service: Podiatry;  Laterality: Right;   HAMMER TOE SURGERY Right 06/05/2022   Procedure: HAMMER TOE CORRECTION;  Surgeon: Candelaria Stagers, DPM;  Location:  [facility];  Service: Podiatry;  Laterality: Right;   REVERSE SHOULDER ARTHROPLASTY Right 12/05/2022   Procedure: RIGHT REVERSE SHOULDER ARTHROPLASTY;  Surgeon: Cammy Copa, MD;  Location: [facility];  Service: Orthopedics;  Laterality: Right;   SHOULDER ARTHROSCOPY WITH ROTATOR CUFF REPAIR AND SUBACROMIAL DECOMPRESSION Right 08/12/2020   Procedure: RIGHT SHOULDER ARTHROSCOPY WITH ROTATOR CUFF REPAIR AND SUBACROMIAL DECOMPRESSION;  Surgeon: Kathryne Hitch, MD;  Location: [facility];  Service: Orthopedics;  Laterality: Right;   TOTAL KNEE ARTHROPLASTY Right 12/24/2020   Procedure: RIGHT TOTAL KNEE ARTHROPLASTY;  Surgeon: Kathryne Hitch, MD;  Location: [facility];  Service: Orthopedics;  Laterality: Right;   TOTAL KNEE ARTHROPLASTY Left 07/15/2021   Procedure: LEFT TOTAL KNEE ARTHROPLASTY;  Surgeon: Kathryne Hitch, MD;  Location: [facility];  Service: Orthopedics;  Laterality: Left;   Patient Active Problem List   Diagnosis Date Noted   Arthritis of right shoulder region 12/24/2022   Biceps tendonitis on right 12/24/2022   S/P reverse total shoulder arthroplasty, right 12/05/2022   Vertigo 11/27/2022   Iron deficiency anemia 10/30/2022   Physical exam, annual 10/30/2022   Medication management 10/30/2022   Class 2 severe obesity due to excess calories with serious comorbidity in adult, unspecified BMI (HCC) 10/23/2022   Essential hypertension 10/23/2022   Prediabetes  10/23/2022   Hypothyroidism 10/23/2022   Insomnia 06/14/2022   Status post total left knee replacement 07/15/2021   Neoplasm of uncertain behavior of skin 05/25/2021   Generalized osteoarthritis of multiple sites 05/25/2021   Fibromyalgia syndrome 05/25/2021   Restless legs 03/16/2021   Hyperlipidemia 03/16/2021   Anxiety 03/16/2021   Status post right knee replacement 12/24/2020   Unilateral primary osteoarthritis, left knee 11/11/2020   Unilateral primary osteoarthritis,  right knee 11/11/2020   Complete tear of right rotator cuff 08/12/2020   Hypertensive disorder 05/05/2019   PCP: Park Meo, FNP  REFERRING PROVIDER: Cammy Copa, MD  REFERRING DIAG: History of arthroplasty of right shoulder [Z96.611]   THERAPY DIAG:  Chronic right shoulder pain  Muscle weakness (generalized)  Rationale for Evaluation and Treatment: Rehabilitation  ONSET DATE: 12/05/22 Rt RSA  SUBJECTIVE:                                                                                                                                                                                                         SUBJECTIVE STATEMENT:  "It was good Tuesday, but yesterday it was hurting right here (points to superior shoulder.)"  PERTINENT HISTORY:  PMHx includes anemia, anxiety/depression, fibromyalgia, HTN, hypothyroidism, vertigo  PAIN:  Are you having pain?None currently  NPRS scale: 5 at worst/10 Pain location: Rt shoulder Pain description: ache;hurt Aggravating factors: overactivity,movement Relieving factors: ice,medication   PRECAUTIONS: None    WEIGHT BEARING RESTRICTIONS: No    OBJECTIVE:   DIAGNOSTIC FINDINGS:   12/31/22 XR Shoulder Right   AP, Scap Y, axillary views right shoulder reviewed.  Right shoulder reverse shoulder prosthesis in good position alignment without any complicating features.  No change compared with prior radiographs from 1 to 2 weeks ago.  No fracture/dislocation.    PATIENT SURVEYS:  FOTO 40 current, 55 predicted  COGNITION: Overall cognitive status: Within functional limits for tasks assessed  SENSATION: WFL  POSTURE: rounded shoulders  PALPATION: Tenderness to palpation along lateral shoulder related to recent fall on 12/23/22. No significant post-op related tenderness to palpation.     UPPER EXTREMITY ROM:  Active ROM Right eval Left eval 01/18/23 Rt PROM 01/25/23 R 01/30/23 Rt   Shoulder flexion 140  140 AROM:  150  AROM: 156 supine   Shoulder extension       Shoulder abduction 80  96  AROM: 111 supine   Shoulder adduction       Shoulder extension       Shoulder internal rotation 72      Shoulder external rotation  35  46    Elbow flexion       Elbow extension       Wrist flexion       Wrist extension       Wrist ulnar deviation       Wrist radial deviation       Wrist pronation       Wrist supination        (Blank rows = not tested)  R shoulder abduction improved by 10deg following manual PT today.   UPPER EXTREMITY MMT: not formally assess at eval d/t ROM deficits and post-op status  MMT Right eval Left eval  Shoulder flexion    Shoulder extension    Shoulder abduction    Shoulder adduction    Shoulder extension    Shoulder internal rotation    Shoulder external rotation    Middle trapezius    Lower trapezius    Elbow flexion    Elbow extension    Wrist flexion    Wrist extension    Wrist ulnar deviation    Wrist radial deviation    Wrist pronation    Wrist supination    Grip strength     (Blank rows = not tested)   TODAY'S TREATMENT:          [facility] Adult PT Treatment:                                                DATE: 02/01/23 Therapeutic Exercise: Bicep curl yellow band 2 x 10  Resisted tricep extension yellow band 2 x 10 Pulleys 2 min flexion   Inclined shoulder flexion AROM long lever 2 x 5  Resisted rows green band 2 x 10  Seated shoulder abduction AROM short lever 2 x 5  ER/IR rhythmic stabilization in scapular plane 5 x 10 sec  Updated HEP  Manual Therapy: Rt shoulder PROM to tolerance flexion, abduction, ER, IR                                                                                                                        [facility] Adult PT Treatment:                                                DATE: 01/30/23 Therapeutic Exercise: Supine shoulder flexion AROM long lever x 10  Supine shoulder abduction AROM long lever x 10  Sidelying shoulder ER AROM  x 10  Sidelying shoulder abduction AROM x 10 Supine serratus punch 2 x 10   Inclined shoulder flexion AROM  Pulleys flexion x 2 minute  Resisted rows red band 2 x 10  Seated shoulder abduction AROM short lever x 10  Manual Therapy: Rt  shoulder PROM to tolerance flexion, abduction, IR, ER    [facility] Adult PT Treatment:                                                DATE: 01/25/23 Therapeutic Exercise: Supine shoulder flexion AROM long lever x 10  Sidelying shoulder abduction AROM long lever x 10  Sidelying shoulder ER x 10  Standing flexion towel slides x 10  Standing ER AAROM in standing with dowel x 10  Standing abduction ER AAROM in standing with dowel x 10  Updated HEP  Manual Therapy: Rt shoulder PROM to tolerance     PATIENT EDUCATION:  Education details: HEP update Person educated: Patient Education method: Explanation, demo, cues, handout Education comprehension: verbalized understanding, returned demo, cues   HOME EXERCISE PROGRAM: Access Code: [email or web address] URL: [email or web address]   ASSESSMENT:  CLINICAL IMPRESSION: Patient tolerated session well today focusing on progression of Rt shoulder ROM and postural strengthening. She demonstrates good control with inclined shoulder flexion AROM through nearly full range, but does fatigue quickly. Minimal cues required today to reduce shoulder shrug with AROM activity. She reported mild ache in the shoulder at conclusion of session and has plans to ice at home.   OBJECTIVE IMPAIRMENTS: decreased activity tolerance, decreased endurance, decreased ROM, decreased strength, impaired UE functional use, postural dysfunction, and pain.   ACTIVITY LIMITATIONS: carrying, lifting, sleeping, bathing, dressing, reach over head, and hygiene/grooming  PARTICIPATION LIMITATIONS: meal prep, cleaning, driving, shopping, community activity, and yard work  PERSONAL FACTORS: Past/current experiences and 1-2 comorbidities: PMHx  includes anemia, anxiety/depression, fibromyalgia, HTN, hypothyroidism, vertigo  are also affecting patient's functional outcome.   REHAB POTENTIAL: Good  CLINICAL DECISION MAKING: Evolving/moderate complexity  EVALUATION COMPLEXITY: Moderate   GOALS: Goals reviewed with patient? Yes  SHORT TERM GOALS: Target date: 02/01/2023    Patient will be independent with initial home program for Rt shoulder ROM.  Baseline: provided at eval Goal status: met  2.  Patient will demonstrate improved AROM by 10 degrees in shoulder flexion/abduction.  Baseline: see objective findings Goal status: met    LONG TERM GOALS: Target date: 03/01/2023    Patient will report improved overall functional ability with FOTO score of 55 or greater.  Baseline: 40 Goal status: INITIAL  2.  Patient will report 2/10 or less with ADLs including don/doff bra and washing her back.  Baseline: difficulty related to pain and limited AROM  Goal status: INITIAL  3.  Patient will demonstrate at least 4/5 R UE strength in all directions Baseline: see objective findings  Goal status: INITIAL  4.  Patient will report ability to return to driving 20 minutes or greater without exacerbation of pain. Baseline: limited to short distance and unable to use R arm as normal when turning.  Goal status: INITIAL  5.  Patient will report improved sleep quality without waking d/t R shoulder pain.  Baseline: walking d/t BIL shoulder pain throughout the night Goal status: INITIAL  6.  Patient will demonstrate ability to push/pull at least 40# with <3/10 pain in order to return to heavy household and yardwork tasks.  Baseline: unable to sweep/vacuum/push the lawnmower Goal status: INITIAL   PLAN:  PT FREQUENCY: 2x/week  PT DURATION: 8 weeks  PLANNED INTERVENTIONS: Therapeutic exercises, Therapeutic activity, Neuromuscular re-education, Patient/Family education, Self Care, Joint mobilization, Electrical stimulation,  Cryotherapy, Moist heat, Manual  therapy, and Re-evaluation  PLAN FOR NEXT SESSION: PROM, AAROM->AROM progression per post op status, manual therapy and pain modulation activities as needed.   Letitia Libra, PT, DPT, ATC 02/01/23 10:11 AM

## 2023-02-03 ENCOUNTER — Other Ambulatory Visit: Payer: Self-pay | Admitting: Gastroenterology

## 2023-02-03 DIAGNOSIS — K219 Gastro-esophageal reflux disease without esophagitis: Secondary | ICD-10-CM

## 2023-02-03 DIAGNOSIS — K2100 Gastro-esophageal reflux disease with esophagitis, without bleeding: Secondary | ICD-10-CM

## 2023-02-06 ENCOUNTER — Ambulatory Visit: Payer: Medicaid Other

## 2023-02-06 DIAGNOSIS — M6281 Muscle weakness (generalized): Secondary | ICD-10-CM

## 2023-02-06 DIAGNOSIS — M25511 Pain in right shoulder: Secondary | ICD-10-CM | POA: Diagnosis not present

## 2023-02-06 DIAGNOSIS — G8929 Other chronic pain: Secondary | ICD-10-CM

## 2023-02-06 NOTE — Therapy (Signed)
OUTPATIENT PHYSICAL THERAPY TREATMENT   Patient Name: Denise Morales MRN: [id number] DOB:16-Feb-1961, 62 y.o., female Today's Date: 02/06/2023  END OF SESSION:  PT End of Session - 02/06/23 0845     Visit Number 8    Number of Visits 17    Date for PT Re-Evaluation 03/01/23    Authorization Type [employer]    Authorization Time Period 7/22-10/20/24    Authorization - Visit Number 7    Authorization - Number of Visits 15    PT Start Time 0845    PT Stop Time 0929    PT Time Calculation (min) 44 min    Activity Tolerance Patient tolerated treatment well    Behavior During Therapy WFL for tasks assessed/performed               Past Medical History:  Diagnosis Date   Allergy    Anemia    Anxiety    Arthritis    Depression    Fibromyalgia    GERD (gastroesophageal reflux disease)    History of hiatal hernia    Hyperlipidemia    Hypertension    Hypothyroidism    Melanoma (HCC)    face   Thyroid disease    Past Surgical History:  Procedure Laterality Date   ABDOMINAL HYSTERECTOMY  1985   AIKEN OSTEOTOMY Right 06/05/2022   Procedure: Quintella Reichert OSTEOTOMY;  Surgeon: Candelaria Stagers, DPM;  Location: [facility];  Service: Podiatry;  Laterality: Right;   BICEPT TENODESIS Right 12/05/2022   Procedure: RIGHT BICEPS TENODESIS;  Surgeon: Cammy Copa, MD;  Location: [facility] OR;  Service: Orthopedics;  Laterality: Right;   CAPSULOTOMY METATARSOPHALANGEAL Right 06/05/2022   Procedure: CAPSULOTOMY METATARSOPHALANGEAL;  Surgeon: Candelaria Stagers, DPM;  Location: [facility] [place];  Service: Podiatry;  Laterality: Right;   CHOLECYSTECTOMY     COLONOSCOPY     HALLUX VALGUS LAPIDUS Right 06/05/2022   Procedure: HALLUX VALGUS LAPIDUS;  Surgeon: Candelaria Stagers, DPM;  Location: [facility];  Service: Podiatry;  Laterality: Right;   HAMMER TOE SURGERY Right 06/05/2022   Procedure: HAMMER TOE CORRECTION;  Surgeon: Candelaria Stagers, DPM;   Location: [facility];  Service: Podiatry;  Laterality: Right;   REVERSE SHOULDER ARTHROPLASTY Right 12/05/2022   Procedure: RIGHT REVERSE SHOULDER ARTHROPLASTY;  Surgeon: Cammy Copa, MD;  Location: [facility];  Service: Orthopedics;  Laterality: Right;   SHOULDER ARTHROSCOPY WITH ROTATOR CUFF REPAIR AND SUBACROMIAL DECOMPRESSION Right 08/12/2020   Procedure: RIGHT SHOULDER ARTHROSCOPY WITH ROTATOR CUFF REPAIR AND SUBACROMIAL DECOMPRESSION;  Surgeon: Kathryne Hitch, MD;  Location: [facility];  Service: Orthopedics;  Laterality: Right;   TOTAL KNEE ARTHROPLASTY Right 12/24/2020   Procedure: RIGHT TOTAL KNEE ARTHROPLASTY;  Surgeon: Kathryne Hitch, MD;  Location: [facility];  Service: Orthopedics;  Laterality: Right;   TOTAL KNEE ARTHROPLASTY Left 07/15/2021   Procedure: LEFT TOTAL KNEE ARTHROPLASTY;  Surgeon: Kathryne Hitch, MD;  Location: [facility];  Service: Orthopedics;  Laterality: Left;   Patient Active Problem List   Diagnosis Date Noted   Arthritis of right shoulder region 12/24/2022   Biceps tendonitis on right 12/24/2022   S/P reverse total shoulder arthroplasty, right 12/05/2022   Vertigo 11/27/2022   Iron deficiency anemia 10/30/2022   Physical exam, annual 10/30/2022   Medication management 10/30/2022   Class 2 severe obesity due to excess calories with serious comorbidity in adult, unspecified BMI (HCC) 10/23/2022   Essential hypertension 10/23/2022  Prediabetes 10/23/2022   Hypothyroidism 10/23/2022   Insomnia 06/14/2022   Status post total left knee replacement 07/15/2021   Neoplasm of uncertain behavior of skin 05/25/2021   Generalized osteoarthritis of multiple sites 05/25/2021   Fibromyalgia syndrome 05/25/2021   Restless legs 03/16/2021   Hyperlipidemia 03/16/2021   Anxiety 03/16/2021   Status post right knee replacement 12/24/2020   Unilateral primary osteoarthritis, left knee 11/11/2020   Unilateral primary  osteoarthritis, right knee 11/11/2020   Complete tear of right rotator cuff 08/12/2020   Hypertensive disorder 05/05/2019   PCP: Park Meo, FNP  REFERRING PROVIDER: Cammy Copa, MD  REFERRING DIAG: History of arthroplasty of right shoulder [Z96.611]   THERAPY DIAG:  Chronic right shoulder pain  Muscle weakness (generalized)  Rationale for Evaluation and Treatment: Rehabilitation  ONSET DATE: 12/05/22 Rt RSA  SUBJECTIVE:                                                                                                                                                                                                         SUBJECTIVE STATEMENT:  "It's ok. No pain right now."  PERTINENT HISTORY:  PMHx includes anemia, anxiety/depression, fibromyalgia, HTN, hypothyroidism, vertigo  PAIN:  Are you having pain?None currently  NPRS scale: 5 at worst/10 Pain location: Rt shoulder Pain description: ache;hurt Aggravating factors: overactivity,movement Relieving factors: ice,medication   PRECAUTIONS: None    WEIGHT BEARING RESTRICTIONS: No    OBJECTIVE:   DIAGNOSTIC FINDINGS:   12/31/22 XR Shoulder Right   AP, Scap Y, axillary views right shoulder reviewed.  Right shoulder reverse shoulder prosthesis in good position alignment without any complicating features.  No change compared with prior radiographs from 1 to 2 weeks ago.  No fracture/dislocation.    PATIENT SURVEYS:  FOTO 40 current, 55 predicted  COGNITION: Overall cognitive status: Within functional limits for tasks assessed  SENSATION: WFL  POSTURE: rounded shoulders  PALPATION: Tenderness to palpation along lateral shoulder related to recent fall on 12/23/22. No significant post-op related tenderness to palpation.     UPPER EXTREMITY ROM:  Active ROM Right eval Left eval 01/18/23 Rt PROM 01/25/23 R 01/30/23 Rt   Shoulder flexion 140  140 AROM: 150  AROM: 156 supine   Shoulder extension        Shoulder abduction 80  96  AROM: 111 supine   Shoulder adduction       Shoulder extension       Shoulder internal rotation 72      Shoulder external rotation 35  46    Elbow flexion  Elbow extension       Wrist flexion       Wrist extension       Wrist ulnar deviation       Wrist radial deviation       Wrist pronation       Wrist supination        (Blank rows = not tested)  R shoulder abduction improved by 10deg following manual PT today.   UPPER EXTREMITY MMT: not formally assess at eval d/t ROM deficits and post-op status  MMT Right eval Left eval  Shoulder flexion    Shoulder extension    Shoulder abduction    Shoulder adduction    Shoulder extension    Shoulder internal rotation    Shoulder external rotation    Middle trapezius    Lower trapezius    Elbow flexion    Elbow extension    Wrist flexion    Wrist extension    Wrist ulnar deviation    Wrist radial deviation    Wrist pronation    Wrist supination    Grip strength     (Blank rows = not tested)   TODAY'S TREATMENT:           [facility] Adult PT Treatment:                                                DATE: 02/06/23 Therapeutic Exercise: Pulleys flexion and abduction x 2 min each  Supine shoulder flexion AROM 2 x 5; 1# Sidelying shoulder abduction AROM 2 x 10; 1#; partial range  Sidelying ER 2 x 10 @ 1 lb  Seated shoulder flexion AROM long lever approx 45 degrees x 10  Seated bicep curl 2 x 10 @ 2 lbs  Manual Therapy: Rt shoulder PROM to tolerance flexion, abduction, IR, ER Gentle GHJ oscillations GHJ mobilizations grade II inferior, A/P   [facility] Adult PT Treatment:                                                DATE: 02/01/23 Therapeutic Exercise: Bicep curl yellow band 2 x 10  Resisted tricep extension yellow band 2 x 10 Pulleys 2 min flexion   Inclined shoulder flexion AROM long lever 2 x 5  Resisted rows green band 2 x 10  Seated shoulder abduction AROM short lever 2 x 5  ER/IR rhythmic  stabilization in scapular plane 5 x 10 sec  Updated HEP  Manual Therapy: Rt shoulder PROM to tolerance flexion, abduction, ER, IR                                                                                                                        [facility] Adult  PT Treatment:                                                DATE: 01/30/23 Therapeutic Exercise: Supine shoulder flexion AROM long lever x 10  Supine shoulder abduction AROM long lever x 10  Sidelying shoulder ER AROM x 10  Sidelying shoulder abduction AROM x 10 Supine serratus punch 2 x 10   Inclined shoulder flexion AROM  Pulleys flexion x 2 minute  Resisted rows red band 2 x 10  Seated shoulder abduction AROM short lever x 10  Manual Therapy: Rt shoulder PROM to tolerance flexion, abduction, IR, ER    [facility] Adult PT Treatment:                                                DATE: 01/25/23 Therapeutic Exercise: Supine shoulder flexion AROM long lever x 10  Sidelying shoulder abduction AROM long lever x 10  Sidelying shoulder ER x 10  Standing flexion towel slides x 10  Standing ER AAROM in standing with dowel x 10  Standing abduction ER AAROM in standing with dowel x 10  Updated HEP  Manual Therapy: Rt shoulder PROM to tolerance     PATIENT EDUCATION:  Education details: HEP update Person educated: Patient Education method: Explanation, demo, cues, handout Education comprehension: verbalized understanding, returned demo, cues   HOME EXERCISE PROGRAM: Access Code: [email or web address] URL: [email or web address]   ASSESSMENT:  CLINICAL IMPRESSION: Patient tolerated session well today with continued progression of Rt shoulder ROM and strengthening. Able to introduce resistance in gravity eliminated positioning with patient demonstrating good stability through near full range of supine shoulder flexion with light resistance and partial range of sidelying abduction. In gravity dependent positioning patient is able to  complete shoulder flexion AROM through approximately 45 degrees without shoulder shrug and no reports of pain.   OBJECTIVE IMPAIRMENTS: decreased activity tolerance, decreased endurance, decreased ROM, decreased strength, impaired UE functional use, postural dysfunction, and pain.   ACTIVITY LIMITATIONS: carrying, lifting, sleeping, bathing, dressing, reach over head, and hygiene/grooming  PARTICIPATION LIMITATIONS: meal prep, cleaning, driving, shopping, community activity, and yard work  PERSONAL FACTORS: Past/current experiences and 1-2 comorbidities: PMHx includes anemia, anxiety/depression, fibromyalgia, HTN, hypothyroidism, vertigo  are also affecting patient's functional outcome.   REHAB POTENTIAL: Good  CLINICAL DECISION MAKING: Evolving/moderate complexity  EVALUATION COMPLEXITY: Moderate   GOALS: Goals reviewed with patient? Yes  SHORT TERM GOALS: Target date: 02/01/2023    Patient will be independent with initial home program for Rt shoulder ROM.  Baseline: provided at eval Goal status: met  2.  Patient will demonstrate improved AROM by 10 degrees in shoulder flexion/abduction.  Baseline: see objective findings Goal status: met    LONG TERM GOALS: Target date: 03/01/2023    Patient will report improved overall functional ability with FOTO score of 55 or greater.  Baseline: 40 Goal status: INITIAL  2.  Patient will report 2/10 or less with ADLs including don/doff bra and washing her back.  Baseline: difficulty related to pain and limited AROM  Goal status: INITIAL  3.  Patient will demonstrate at least 4/5 R UE strength in all directions Baseline: see objective findings  Goal status: INITIAL  4.  Patient will  report ability to return to driving 20 minutes or greater without exacerbation of pain. Baseline: limited to short distance and unable to use R arm as normal when turning.  Goal status: INITIAL  5.  Patient will report improved sleep quality without  waking d/t R shoulder pain.  Baseline: walking d/t BIL shoulder pain throughout the night Goal status: INITIAL  6.  Patient will demonstrate ability to push/pull at least 40# with <3/10 pain in order to return to heavy household and yardwork tasks.  Baseline: unable to sweep/vacuum/push the lawnmower Goal status: INITIAL   PLAN:  PT FREQUENCY: 2x/week  PT DURATION: 8 weeks  PLANNED INTERVENTIONS: Therapeutic exercises, Therapeutic activity, Neuromuscular re-education, Patient/Family education, Self Care, Joint mobilization, Electrical stimulation, Cryotherapy, Moist heat, Manual therapy, and Re-evaluation  PLAN FOR NEXT SESSION: PROM, AAROM->AROM progression per post op status, manual therapy and pain modulation activities as needed.   Letitia Libra, PT, DPT, ATC 02/06/23 9:30 AM

## 2023-02-08 ENCOUNTER — Ambulatory Visit: Payer: Medicaid Other

## 2023-02-08 DIAGNOSIS — M25511 Pain in right shoulder: Secondary | ICD-10-CM | POA: Diagnosis not present

## 2023-02-08 DIAGNOSIS — M6281 Muscle weakness (generalized): Secondary | ICD-10-CM

## 2023-02-08 DIAGNOSIS — G8929 Other chronic pain: Secondary | ICD-10-CM

## 2023-02-08 NOTE — Therapy (Signed)
OUTPATIENT PHYSICAL THERAPY TREATMENT   Patient Name: Denise Morales MRN: [id number] DOB:06/01/1961, 62 y.o., female Today's Date: 02/08/2023  END OF SESSION:  PT End of Session - 02/08/23 0844     Visit Number 9    Number of Visits 17    Date for PT Re-Evaluation 03/01/23    Authorization Type [employer]    Authorization Time Period 7/22-10/20/24    Authorization - Visit Number 8    Authorization - Number of Visits 15    PT Start Time 0845    PT Stop Time 0931    PT Time Calculation (min) 46 min    Activity Tolerance Patient tolerated treatment well    Behavior During Therapy WFL for tasks assessed/performed               Past Medical History:  Diagnosis Date   Allergy    Anemia    Anxiety    Arthritis    Depression    Fibromyalgia    GERD (gastroesophageal reflux disease)    History of hiatal hernia    Hyperlipidemia    Hypertension    Hypothyroidism    Melanoma (HCC)    face   Thyroid disease    Past Surgical History:  Procedure Laterality Date   ABDOMINAL HYSTERECTOMY  1985   AIKEN OSTEOTOMY Right 06/05/2022   Procedure: Quintella Reichert OSTEOTOMY;  Surgeon: Candelaria Stagers, DPM;  Location: [facility];  Service: Podiatry;  Laterality: Right;   BICEPT TENODESIS Right 12/05/2022   Procedure: RIGHT BICEPS TENODESIS;  Surgeon: Cammy Copa, MD;  Location: [facility] OR;  Service: Orthopedics;  Laterality: Right;   CAPSULOTOMY METATARSOPHALANGEAL Right 06/05/2022   Procedure: CAPSULOTOMY METATARSOPHALANGEAL;  Surgeon: Candelaria Stagers, DPM;  Location: [facility];  Service: Podiatry;  Laterality: Right;   CHOLECYSTECTOMY     COLONOSCOPY     HALLUX VALGUS LAPIDUS Right 06/05/2022   Procedure: HALLUX VALGUS LAPIDUS;  Surgeon: Candelaria Stagers, DPM;  Location: [facility];  Service: Podiatry;  Laterality: Right;   HAMMER TOE SURGERY Right 06/05/2022   Procedure: HAMMER TOE CORRECTION;  Surgeon: Candelaria Stagers, DPM;   Location: [facility];  Service: Podiatry;  Laterality: Right;   REVERSE SHOULDER ARTHROPLASTY Right 12/05/2022   Procedure: RIGHT REVERSE SHOULDER ARTHROPLASTY;  Surgeon: Cammy Copa, MD;  Location: [facility] OR;  Service: Orthopedics;  Laterality: Right;   SHOULDER ARTHROSCOPY WITH ROTATOR CUFF REPAIR AND SUBACROMIAL DECOMPRESSION Right 08/12/2020   Procedure: RIGHT SHOULDER ARTHROSCOPY WITH ROTATOR CUFF REPAIR AND SUBACROMIAL DECOMPRESSION;  Surgeon: Kathryne Hitch, MD;  Location: [facility];  Service: Orthopedics;  Laterality: Right;   TOTAL KNEE ARTHROPLASTY Right 12/24/2020   Procedure: RIGHT TOTAL KNEE ARTHROPLASTY;  Surgeon: Kathryne Hitch, MD;  Location: [facility];  Service: Orthopedics;  Laterality: Right;   TOTAL KNEE ARTHROPLASTY Left 07/15/2021   Procedure: LEFT TOTAL KNEE ARTHROPLASTY;  Surgeon: Kathryne Hitch, MD;  Location: [facility];  Service: Orthopedics;  Laterality: Left;   Patient Active Problem List   Diagnosis Date Noted   Arthritis of right shoulder region 12/24/2022   Biceps tendonitis on right 12/24/2022   S/P reverse total shoulder arthroplasty, right 12/05/2022   Vertigo 11/27/2022   Iron deficiency anemia 10/30/2022   Physical exam, annual 10/30/2022   Medication management 10/30/2022   Class 2 severe obesity due to excess calories with serious comorbidity in adult, unspecified BMI (HCC) 10/23/2022   Essential hypertension 10/23/2022  Prediabetes 10/23/2022   Hypothyroidism 10/23/2022   Insomnia 06/14/2022   Status post total left knee replacement 07/15/2021   Neoplasm of uncertain behavior of skin 05/25/2021   Generalized osteoarthritis of multiple sites 05/25/2021   Fibromyalgia syndrome 05/25/2021   Restless legs 03/16/2021   Hyperlipidemia 03/16/2021   Anxiety 03/16/2021   Status post right knee replacement 12/24/2020   Unilateral primary osteoarthritis, left knee 11/11/2020   Unilateral primary  osteoarthritis, right knee 11/11/2020   Complete tear of right rotator cuff 08/12/2020   Hypertensive disorder 05/05/2019   PCP: Park Meo, FNP  REFERRING PROVIDER: Cammy Copa, MD  REFERRING DIAG: History of arthroplasty of right shoulder [Z96.611]   THERAPY DIAG:  Chronic right shoulder pain  Muscle weakness (generalized)  Rationale for Evaluation and Treatment: Rehabilitation  ONSET DATE: 12/05/22 Rt RSA  SUBJECTIVE:                                                                                                                                                                                                         SUBJECTIVE STATEMENT:  "It feels good right now. No pain."  PERTINENT HISTORY:  PMHx includes anemia, anxiety/depression, fibromyalgia, HTN, hypothyroidism, vertigo  PAIN:  Are you having pain?None currently  NPRS scale: 5 at worst/10 Pain location: Rt shoulder Pain description: ache;hurt Aggravating factors: overactivity,movement Relieving factors: ice,medication   PRECAUTIONS: None    WEIGHT BEARING RESTRICTIONS: No    OBJECTIVE:   DIAGNOSTIC FINDINGS:   12/31/22 XR Shoulder Right   AP, Scap Y, axillary views right shoulder reviewed.  Right shoulder reverse shoulder prosthesis in good position alignment without any complicating features.  No change compared with prior radiographs from 1 to 2 weeks ago.  No fracture/dislocation.    PATIENT SURVEYS:  FOTO 40 current, 55 predicted  COGNITION: Overall cognitive status: Within functional limits for tasks assessed  SENSATION: WFL  POSTURE: rounded shoulders  PALPATION: Tenderness to palpation along lateral shoulder related to recent fall on 12/23/22. No significant post-op related tenderness to palpation.     UPPER EXTREMITY ROM:  Active ROM Right eval Left eval 01/18/23 Rt PROM 01/25/23 R 01/30/23 Rt  02/08/23 Rt  Shoulder flexion 140  140 AROM: 150  AROM: 156 supine  AROM:  165 supine; 85 in standing before shoulder shrug present    Shoulder extension        Shoulder abduction 80  96  AROM: 111 supine  AROM: 110 supine  PROM 116 supine   Shoulder adduction        Shoulder extension  Shoulder internal rotation 72       Shoulder external rotation 35  46     Elbow flexion        Elbow extension        Wrist flexion        Wrist extension        Wrist ulnar deviation        Wrist radial deviation        Wrist pronation        Wrist supination         (Blank rows = not tested)  R shoulder abduction improved by 10deg following manual PT today.   UPPER EXTREMITY MMT: not formally assess at eval d/t ROM deficits and post-op status  MMT Right eval Left eval  Shoulder flexion    Shoulder extension    Shoulder abduction    Shoulder adduction    Shoulder extension    Shoulder internal rotation    Shoulder external rotation    Middle trapezius    Lower trapezius    Elbow flexion    Elbow extension    Wrist flexion    Wrist extension    Wrist ulnar deviation    Wrist radial deviation    Wrist pronation    Wrist supination    Grip strength     (Blank rows = not tested)   TODAY'S TREATMENT:          [facility] Adult PT Treatment:                                                DATE: 02/08/23 Therapeutic Exercise: Pulleys flexion and abduction x 2 minute each  Finger ladder flexion,abduction x 5 each  Inclined shoulder flexion long lever 2 x 10  Rows 2 x 10 green band  Seated shoulder abduction AROM short lever x 10  Standing bicep curl 3# 2 x 10  Standing shoulder flexion long lever 2 x 5  Updated HEP  Manual Therapy: Rt shoulder PROM abduction, ER,IR Rt shoulder GHJ gentle oscillations, grade II inferior,A/P mobilizations    [facility] Adult PT Treatment:                                                DATE: 02/06/23 Therapeutic Exercise: Pulleys flexion and abduction x 2 min each  Supine shoulder flexion AROM 2 x 5; 1# Sidelying shoulder  abduction AROM 2 x 10; 1#; partial range  Sidelying ER 2 x 10 @ 1 lb  Seated shoulder flexion AROM long lever approx 45 degrees x 10  Seated bicep curl 2 x 10 @ 2 lbs  Manual Therapy: Rt shoulder PROM to tolerance flexion, abduction, IR, ER Gentle GHJ oscillations GHJ mobilizations grade II inferior, A/P   [facility] Adult PT Treatment:                                                DATE: 02/01/23 Therapeutic Exercise: Bicep curl yellow band 2 x 10  Resisted tricep extension yellow band 2 x 10 Pulleys 2 min flexion   Inclined shoulder flexion AROM  long lever 2 x 5  Resisted rows green band 2 x 10  Seated shoulder abduction AROM short lever 2 x 5  ER/IR rhythmic stabilization in scapular plane 5 x 10 sec  Updated HEP  Manual Therapy: Rt shoulder PROM to tolerance flexion, abduction, ER, IR                                                                                                                        [facility] Adult PT Treatment:                                                DATE: 01/30/23 Therapeutic Exercise: Supine shoulder flexion AROM long lever x 10  Supine shoulder abduction AROM long lever x 10  Sidelying shoulder ER AROM x 10  Sidelying shoulder abduction AROM x 10 Supine serratus punch 2 x 10   Inclined shoulder flexion AROM  Pulleys flexion x 2 minute  Resisted rows red band 2 x 10  Seated shoulder abduction AROM short lever x 10  Manual Therapy: Rt shoulder PROM to tolerance flexion, abduction, IR, ER    [facility] Adult PT Treatment:                                                DATE: 01/25/23 Therapeutic Exercise: Supine shoulder flexion AROM long lever x 10  Sidelying shoulder abduction AROM long lever x 10  Sidelying shoulder ER x 10  Standing flexion towel slides x 10  Standing ER AAROM in standing with dowel x 10  Standing abduction ER AAROM in standing with dowel x 10  Updated HEP  Manual Therapy: Rt shoulder PROM to tolerance     PATIENT EDUCATION:   Education details: HEP update Person educated: Patient Education method: Explanation, demo, cues, handout Education comprehension: verbalized understanding, returned demo, cues   HOME EXERCISE PROGRAM: Access Code: [email or web address] URL: [email or web address]   ASSESSMENT:  CLINICAL IMPRESSION: Patient is making good progress at 9 weeks s/p Rt RSA. Flexion AROM continues to gradually improve compared to previous assessment. Abduction AROM remains relatively unchanged compared to last measurements. Progressed towards more gravity dependent ROM activity with good tolerance. She fatigues rather quickly with inclined shoulder flexion AROM, but is able to work through near full range in this position with good form. Able to achieve 85 degrees of elevation in standing today prior to shoulder shrug compensation. No reports of pain with ther ex today. HEP was updated to include further strengthening.   OBJECTIVE IMPAIRMENTS: decreased activity tolerance, decreased endurance, decreased ROM, decreased strength, impaired UE functional use, postural dysfunction, and pain.   ACTIVITY LIMITATIONS: carrying, lifting, sleeping, bathing, dressing, reach over head, and hygiene/grooming  PARTICIPATION LIMITATIONS: meal prep, cleaning,  driving, shopping, community activity, and yard work  PERSONAL FACTORS: Past/current experiences and 1-2 comorbidities: PMHx includes anemia, anxiety/depression, fibromyalgia, HTN, hypothyroidism, vertigo  are also affecting patient's functional outcome.   REHAB POTENTIAL: Good  CLINICAL DECISION MAKING: Evolving/moderate complexity  EVALUATION COMPLEXITY: Moderate   GOALS: Goals reviewed with patient? Yes  SHORT TERM GOALS: Target date: 02/01/2023    Patient will be independent with initial home program for Rt shoulder ROM.  Baseline: provided at eval Goal status: met  2.  Patient will demonstrate improved AROM by 10 degrees in shoulder flexion/abduction.   Baseline: see objective findings Goal status: met    LONG TERM GOALS: Target date: 03/01/2023    Patient will report improved overall functional ability with FOTO score of 55 or greater.  Baseline: 40 Goal status: INITIAL  2.  Patient will report 2/10 or less with ADLs including don/doff bra and washing her back.  Baseline: difficulty related to pain and limited AROM  Goal status: INITIAL  3.  Patient will demonstrate at least 4/5 R UE strength in all directions Baseline: see objective findings  Goal status: INITIAL  4.  Patient will report ability to return to driving 20 minutes or greater without exacerbation of pain. Baseline: limited to short distance and unable to use R arm as normal when turning.  Goal status: INITIAL  5.  Patient will report improved sleep quality without waking d/t R shoulder pain.  Baseline: walking d/t BIL shoulder pain throughout the night Goal status: INITIAL  6.  Patient will demonstrate ability to push/pull at least 40# with <3/10 pain in order to return to heavy household and yardwork tasks.  Baseline: unable to sweep/vacuum/push the lawnmower Goal status: INITIAL   PLAN:  PT FREQUENCY: 2x/week  PT DURATION: 8 weeks  PLANNED INTERVENTIONS: Therapeutic exercises, Therapeutic activity, Neuromuscular re-education, Patient/Family education, Self Care, Joint mobilization, Electrical stimulation, Cryotherapy, Moist heat, Manual therapy, and Re-evaluation  PLAN FOR NEXT SESSION: PROM, AAROM->AROM progression per post op status, manual therapy and pain modulation activities as needed.   Letitia Libra, PT, DPT, ATC 02/08/23 9:33 AM

## 2023-02-13 ENCOUNTER — Ambulatory Visit: Payer: Medicaid Other

## 2023-02-13 DIAGNOSIS — M6281 Muscle weakness (generalized): Secondary | ICD-10-CM

## 2023-02-13 DIAGNOSIS — G8929 Other chronic pain: Secondary | ICD-10-CM

## 2023-02-13 DIAGNOSIS — M25511 Pain in right shoulder: Secondary | ICD-10-CM | POA: Diagnosis not present

## 2023-02-13 NOTE — Therapy (Signed)
OUTPATIENT PHYSICAL THERAPY TREATMENT   Patient Name: Denise Morales MRN: [id number] DOB:Feb 10, 1961, 62 y.o., , female Today's Date: 02/13/2023  END OF SESSION:  PT End of Session - 02/13/23 0839     Visit Number 10    Number of Visits 17    Date for PT Re-Evaluation 03/01/23    Authorization Type [employer]    Authorization Time Period 7/22-10/20/24    Authorization - Visit Number 9    Authorization - Number of Visits 15    PT Start Time 0841    PT Stop Time 0924    PT Time Calculation (min) 43 min    Activity Tolerance Patient tolerated treatment well    Behavior During Therapy [facility] for tasks assessed/performed               Past Medical History:  Diagnosis Date   Allergy    Anemia    Anxiety    Arthritis    Depression    Fibromyalgia    GERD (gastroesophageal reflux disease)    History of hiatal hernia    Hyperlipidemia    Hypertension    Hypothyroidism    Melanoma (HCC)    face   Thyroid disease    Past Surgical History:  Procedure Laterality Date   ABDOMINAL HYSTERECTOMY  1985   AIKEN OSTEOTOMY Right 06/05/2022   Procedure: Quintella Reichert OSTEOTOMY;  Surgeon: Candelaria Stagers, DPM;  Location: [facility];  Service: Podiatry;  Laterality: Right;   BICEPT TENODESIS Right 12/05/2022   Procedure: RIGHT BICEPS TENODESIS;  Surgeon: Cammy Copa, MD;  Location: [facility] OR;  Service: Orthopedics;  Laterality: Right;   CAPSULOTOMY METATARSOPHALANGEAL Right 06/05/2022   Procedure: CAPSULOTOMY METATARSOPHALANGEAL;  Surgeon: Candelaria Stagers, DPM;  Location: [facility];  Service: Podiatry;  Laterality: Right;   CHOLECYSTECTOMY     COLONOSCOPY     HALLUX VALGUS LAPIDUS Right 06/05/2022   Procedure: HALLUX VALGUS LAPIDUS;  Surgeon: Candelaria Stagers, DPM;  Location: [facility];  Service: Podiatry;  Laterality: Right;   HAMMER TOE SURGERY Right 06/05/2022   Procedure: HAMMER TOE CORRECTION;  Surgeon: Candelaria Stagers, DPM;   Location: [facility];  Service: Podiatry;  Laterality: Right;   REVERSE SHOULDER ARTHROPLASTY Right 12/05/2022   Procedure: RIGHT REVERSE SHOULDER ARTHROPLASTY;  Surgeon: Cammy Copa, MD;  Location: [facility] OR;  Service: Orthopedics;  Laterality: Right;   SHOULDER ARTHROSCOPY WITH ROTATOR CUFF REPAIR AND SUBACROMIAL DECOMPRESSION Right 08/12/2020   Procedure: RIGHT SHOULDER ARTHROSCOPY WITH ROTATOR CUFF REPAIR AND SUBACROMIAL DECOMPRESSION;  Surgeon: Kathryne Hitch, MD;  Location: [facility];  Service: Orthopedics;  Laterality: Right;   TOTAL KNEE ARTHROPLASTY Right 12/24/2020   Procedure: RIGHT TOTAL KNEE ARTHROPLASTY;  Surgeon: Kathryne Hitch, MD;  Location: [facility];  Service: Orthopedics;  Laterality: Right;   TOTAL KNEE ARTHROPLASTY Left 07/15/2021   Procedure: LEFT TOTAL KNEE ARTHROPLASTY;  Surgeon: Kathryne Hitch, MD;  Location: [facility];  Service: Orthopedics;  Laterality: Left;   Patient Active Problem List   Diagnosis Date Noted   Arthritis of right shoulder region 12/24/2022   Biceps tendonitis on right 12/24/2022   S/P reverse total shoulder arthroplasty, right 12/05/2022   Vertigo 11/27/2022   Iron deficiency anemia 10/30/2022   Physical exam, annual 10/30/2022   Medication management 10/30/2022   Class 2 severe obesity due to excess calories with serious comorbidity in adult, unspecified BMI (HCC) 10/23/2022   Essential hypertension 10/23/2022  Prediabetes 10/23/2022   Hypothyroidism 10/23/2022   Insomnia 06/14/2022   Status post total left knee replacement 07/15/2021   Neoplasm of uncertain behavior of skin 05/25/2021   Generalized osteoarthritis of multiple sites 05/25/2021   Fibromyalgia syndrome 05/25/2021   Restless legs 03/16/2021   Hyperlipidemia 03/16/2021   Anxiety 03/16/2021   Status post right knee replacement 12/24/2020   Unilateral primary osteoarthritis, left knee 11/11/2020   Unilateral primary  osteoarthritis, right knee 11/11/2020   Complete tear of right rotator cuff 08/12/2020   Hypertensive disorder 05/05/2019   PCP: Park Meo, FNP  REFERRING PROVIDER: Cammy Copa, MD  REFERRING DIAG: History of arthroplasty of right shoulder [Z96.611]   THERAPY DIAG:  Chronic right shoulder pain  Muscle weakness (generalized)  Rationale for Evaluation and Treatment: Rehabilitation  ONSET DATE: 12/05/22 Rt RSA  SUBJECTIVE:                                                                                                                                                                                                         SUBJECTIVE STATEMENT:  "It just aches."  PERTINENT HISTORY:  PMHx includes anemia, anxiety/depression, fibromyalgia, HTN, hypothyroidism, vertigo  PAIN:  Are you having pain?None currently  NPRS scale: 3/10 Pain location: Rt shoulder Pain description: ache;hurt Aggravating factors: overactivity,movement Relieving factors: ice,medication   PRECAUTIONS: None    WEIGHT BEARING RESTRICTIONS: No    OBJECTIVE:   DIAGNOSTIC FINDINGS:   12/31/22 XR Shoulder Right   AP, Scap Y, axillary views right shoulder reviewed.  Right shoulder reverse shoulder prosthesis in good position alignment without any complicating features.  No change compared with prior radiographs from 1 to 2 weeks ago.  No fracture/dislocation.    PATIENT SURVEYS:  FOTO 40 current, 55 predicted  COGNITION: Overall cognitive status: Within functional limits for tasks assessed  SENSATION: WFL  POSTURE: rounded shoulders  PALPATION: Tenderness to palpation along lateral shoulder related to recent fall on 12/23/22. No significant post-op related tenderness to palpation.     UPPER EXTREMITY ROM:  Active ROM Right eval Left eval 01/18/23 Rt PROM 01/25/23 R 01/30/23 Rt  02/08/23 Rt 02/13/23 Rt  Shoulder flexion 140  140 AROM: 150  AROM: 156 supine  AROM: 165 supine; 85 in  standing before shoulder shrug present     Shoulder extension         Shoulder abduction 80  96  AROM: 111 supine  AROM: 110 supine  PROM 116 supine  AROM: 65 in sitting   Shoulder adduction  Shoulder extension         Shoulder internal rotation 72        Shoulder external rotation 35  46      Elbow flexion         Elbow extension         Wrist flexion         Wrist extension         Wrist ulnar deviation         Wrist radial deviation         Wrist pronation         Wrist supination          (Blank rows = not tested)  R shoulder abduction improved by 10deg following manual PT today.   UPPER EXTREMITY MMT: not formally assess at eval d/t ROM deficits and post-op status  MMT Right eval Left eval  Shoulder flexion    Shoulder extension    Shoulder abduction    Shoulder adduction    Shoulder extension    Shoulder internal rotation    Shoulder external rotation    Middle trapezius    Lower trapezius    Elbow flexion    Elbow extension    Wrist flexion    Wrist extension    Wrist ulnar deviation    Wrist radial deviation    Wrist pronation    Wrist supination    Grip strength     (Blank rows = not tested)   TODAY'S TREATMENT:          [facility] Adult PT Treatment:                                                DATE: 02/13/23 Therapeutic Exercise: Pulleys flexion x 2 min each flexion and abduction  Finger ladder flexion/abduction x 5 each  Supine shoulder flexion 2 x 10; 1# Seated shoulder abduction long lever 2 x 10  Standing shoulder flexion AROM long lever 2 x 10  Supine serratus punch 1# 2 x 10  Rhythmic stabilization flexion and ER/IR in scapular plane; 5 x 20 sec each     [facility] Adult PT Treatment:                                                DATE: 02/08/23 Therapeutic Exercise: Pulleys flexion and abduction x 2 minute each  Finger ladder flexion,abduction x 5 each  Inclined shoulder flexion long lever 2 x 10  Rows 2 x 10 green band  Seated shoulder  abduction AROM short lever x 10  Standing bicep curl 3# 2 x 10  Standing shoulder flexion long lever 2 x 5  Updated HEP  Manual Therapy: Rt shoulder PROM abduction, ER,IR Rt shoulder GHJ gentle oscillations, grade II inferior,A/P mobilizations    [facility] Adult PT Treatment:                                                DATE: 02/06/23 Therapeutic Exercise: Pulleys flexion and abduction x 2 min each  Supine shoulder flexion AROM 2 x 5; 1# Sidelying shoulder  abduction AROM 2 x 10; 1#; partial range  Sidelying ER 2 x 10 @ 1 lb  Seated shoulder flexion AROM long lever approx 45 degrees x 10  Seated bicep curl 2 x 10 @ 2 lbs  Manual Therapy: Rt shoulder PROM to tolerance flexion, abduction, IR, ER Gentle GHJ oscillations GHJ mobilizations grade II inferior, A/P    PATIENT EDUCATION:  Education details: HEP review Person educated: Patient Education method: Explanation Education comprehension: verbalized understanding  HOME EXERCISE PROGRAM: Access Code: [email or web address] URL: [email or web address]   ASSESSMENT:  CLINICAL IMPRESSION: Patient continues to progress well at 10 weeks s/p Rt RSA. Continued with progression of Rt shoulder AROM and strengthening with good tolerance. Able to initiate long lever shoulder abduction AROM in sitting with patient able to complete through 65 degrees before shoulder shrug present. No changes made to HEP this visit.   OBJECTIVE IMPAIRMENTS: decreased activity tolerance, decreased endurance, decreased ROM, decreased strength, impaired UE functional use, postural dysfunction, and pain.   ACTIVITY LIMITATIONS: carrying, lifting, sleeping, bathing, dressing, reach over head, and hygiene/grooming  PARTICIPATION LIMITATIONS: meal prep, cleaning, driving, shopping, community activity, and yard work  PERSONAL FACTORS: Past/current experiences and 1-2 comorbidities: PMHx includes anemia, anxiety/depression, fibromyalgia, HTN, hypothyroidism, vertigo   are also affecting patient's functional outcome.   REHAB POTENTIAL: Good  CLINICAL DECISION MAKING: Evolving/moderate complexity  EVALUATION COMPLEXITY: Moderate   GOALS: Goals reviewed with patient? Yes  SHORT TERM GOALS: Target date: 02/01/2023    Patient will be independent with initial home program for Rt shoulder ROM.  Baseline: provided at eval Goal status: met  2.  Patient will demonstrate improved AROM by 10 degrees in shoulder flexion/abduction.  Baseline: see objective findings Goal status: met    LONG TERM GOALS: Target date: 03/01/2023    Patient will report improved overall functional ability with FOTO score of 55 or greater.  Baseline: 40 Goal status: INITIAL  2.  Patient will report 2/10 or less with ADLs including don/doff bra and washing her back.  Baseline: difficulty related to pain and limited AROM  Goal status: INITIAL  3.  Patient will demonstrate at least 4/5 R UE strength in all directions Baseline: see objective findings  Goal status: INITIAL  4.  Patient will report ability to return to driving 20 minutes or greater without exacerbation of pain. Baseline: limited to short distance and unable to use R arm as normal when turning.  Goal status: INITIAL  5.  Patient will report improved sleep quality without waking d/t R shoulder pain.  Baseline: walking d/t BIL shoulder pain throughout the night Goal status: INITIAL  6.  Patient will demonstrate ability to push/pull at least 40# with <3/10 pain in order to return to heavy household and yardwork tasks.  Baseline: unable to sweep/vacuum/push the lawnmower Goal status: INITIAL   PLAN:  PT FREQUENCY: 2x/week  PT DURATION: 8 weeks  PLANNED INTERVENTIONS: Therapeutic exercises, Therapeutic activity, Neuromuscular re-education, Patient/Family education, Self Care, Joint mobilization, Electrical stimulation, Cryotherapy, Moist heat, Manual therapy, and Re-evaluation  PLAN FOR NEXT SESSION: PROM,  AAROM->AROM progression per post op status, manual therapy and pain modulation activities as needed.   Letitia Libra, PT, DPT, ATC 02/13/23 9:24 AM

## 2023-02-15 ENCOUNTER — Ambulatory Visit: Payer: Medicaid Other

## 2023-02-16 ENCOUNTER — Ambulatory Visit: Payer: Medicaid Other

## 2023-02-16 DIAGNOSIS — M25511 Pain in right shoulder: Secondary | ICD-10-CM | POA: Diagnosis not present

## 2023-02-16 DIAGNOSIS — M6281 Muscle weakness (generalized): Secondary | ICD-10-CM

## 2023-02-16 DIAGNOSIS — G8929 Other chronic pain: Secondary | ICD-10-CM

## 2023-02-16 NOTE — Therapy (Signed)
OUTPATIENT PHYSICAL THERAPY TREATMENT   Patient Name: Denise Morales MRN: [id number] DOB:[birth year], 62 y.o., female Today's Date: 02/16/2023  END OF SESSION:  PT End of Session - 02/16/23 0931     Visit Number 11    Number of Visits 17    Date for PT Re-Evaluation 03/01/23    Authorization Type [employer]    Authorization Time Period 7/22-10/20/24    Authorization - Visit Number 10    Authorization - Number of Visits 15    PT Start Time 0931    PT Stop Time 1013    PT Time Calculation (min) 42 min    Activity Tolerance Patient tolerated treatment well    Behavior During Therapy WFL for tasks assessed/performed               Past Medical History:  Diagnosis Date   Allergy    Anemia    Anxiety    Arthritis    Depression    Fibromyalgia    GERD (gastroesophageal reflux disease)    History of hiatal hernia    Hyperlipidemia    Hypertension    Hypothyroidism    Melanoma (HCC)    face   Thyroid disease    Past Surgical History:  Procedure Laterality Date   ABDOMINAL HYSTERECTOMY  1985   AIKEN OSTEOTOMY Right 06/05/2022   Procedure: Quintella Reichert OSTEOTOMY;  Surgeon: Candelaria Stagers, DPM;  Location: [facility];  Service: Podiatry;  Laterality: Right;   BICEPT TENODESIS Right 12/05/2022   Procedure: RIGHT BICEPS TENODESIS;  Surgeon: Cammy Copa, MD;  Location: [facility] OR;  Service: Orthopedics;  Laterality: Right;   CAPSULOTOMY METATARSOPHALANGEAL Right 06/05/2022   Procedure: CAPSULOTOMY METATARSOPHALANGEAL;  Surgeon: Candelaria Stagers, DPM;  Location: [facility];  Service: Podiatry;  Laterality: Right;   CHOLECYSTECTOMY     COLONOSCOPY     HALLUX VALGUS LAPIDUS Right 06/05/2022   Procedure: HALLUX VALGUS LAPIDUS;  Surgeon: Candelaria Stagers, DPM;  Location: [facility];  Service: Podiatry;  Laterality: Right;   HAMMER TOE SURGERY Right 06/05/2022   Procedure: HAMMER TOE CORRECTION;  Surgeon: Candelaria Stagers, DPM;   Location: [facility];  Service: Podiatry;  Laterality: Right;   REVERSE SHOULDER ARTHROPLASTY Right 12/05/2022   Procedure: RIGHT REVERSE SHOULDER ARTHROPLASTY;  Surgeon: Cammy Copa, MD;  Location: [facility];  Service: Orthopedics;  Laterality: Right;   SHOULDER ARTHROSCOPY WITH ROTATOR CUFF REPAIR AND SUBACROMIAL DECOMPRESSION Right 08/12/2020   Procedure: RIGHT SHOULDER ARTHROSCOPY WITH ROTATOR CUFF REPAIR AND SUBACROMIAL DECOMPRESSION;  Surgeon: Kathryne Hitch, MD;  Location: [facility];  Service: Orthopedics;  Laterality: Right;   TOTAL KNEE ARTHROPLASTY Right 12/24/2020   Procedure: RIGHT TOTAL KNEE ARTHROPLASTY;  Surgeon: Kathryne Hitch, MD;  Location: [facility];  Service: Orthopedics;  Laterality: Right;   TOTAL KNEE ARTHROPLASTY Left 07/15/2021   Procedure: LEFT TOTAL KNEE ARTHROPLASTY;  Surgeon: Kathryne Hitch, MD;  Location: [facility];  Service: Orthopedics;  Laterality: Left;   Patient Active Problem List   Diagnosis Date Noted   Arthritis of right shoulder region 12/24/2022   Biceps tendonitis on right 12/24/2022   S/P reverse total shoulder arthroplasty, right 12/05/2022   Vertigo 11/27/2022   Iron deficiency anemia 10/30/2022   Physical exam, annual 10/30/2022   Medication management 10/30/2022   Class 2 severe obesity due to excess calories with serious comorbidity in adult, unspecified BMI (HCC) 10/23/2022   Essential hypertension 10/23/2022  Prediabetes 10/23/2022   Hypothyroidism 10/23/2022   Insomnia 06/14/2022   Status post total left knee replacement 07/15/2021   Neoplasm of uncertain behavior of skin 05/25/2021   Generalized osteoarthritis of multiple sites 05/25/2021   Fibromyalgia syndrome 05/25/2021   Restless legs 03/16/2021   Hyperlipidemia 03/16/2021   Anxiety 03/16/2021   Status post right knee replacement 12/24/2020   Unilateral primary osteoarthritis, left knee 11/11/2020   Unilateral primary  osteoarthritis, right knee 11/11/2020   Complete tear of right rotator cuff 08/12/2020   Hypertensive disorder 05/05/2019   PCP: Park Meo, FNP  REFERRING PROVIDER: Cammy Copa, MD  REFERRING DIAG: History of arthroplasty of right shoulder [Z96.611]   THERAPY DIAG:  Chronic right shoulder pain  Muscle weakness (generalized)  Rationale for Evaluation and Treatment: Rehabilitation  ONSET DATE: 12/05/22 Rt RSA  SUBJECTIVE:                                                                                                                                                                                                         SUBJECTIVE STATEMENT:  "No pain right now."  PERTINENT HISTORY:  PMHx includes anemia, anxiety/depression, fibromyalgia, HTN, hypothyroidism, vertigo  PAIN:  Are you having pain?None currently  NPRS scale: 4 at worst/10 Pain location: Rt shoulder Pain description: ache;hurt Aggravating factors: overactivity,movement Relieving factors: ice,medication   PRECAUTIONS: None    WEIGHT BEARING RESTRICTIONS: No    OBJECTIVE:   DIAGNOSTIC FINDINGS:   12/31/22 XR Shoulder Right   AP, Scap Y, axillary views right shoulder reviewed.  Right shoulder reverse shoulder prosthesis in good position alignment without any complicating features.  No change compared with prior radiographs from 1 to 2 weeks ago.  No fracture/dislocation.    PATIENT SURVEYS:  FOTO 40 current, 55 predicted 02/16/23: 63% function   COGNITION: Overall cognitive status: Within functional limits for tasks assessed  SENSATION: WFL  POSTURE: rounded shoulders  PALPATION: Tenderness to palpation along lateral shoulder related to recent fall on 12/23/22. No significant post-op related tenderness to palpation.     UPPER EXTREMITY ROM:  Active ROM Right eval Left eval 01/18/23 Rt PROM 01/25/23 R 01/30/23 Rt  02/08/23 Rt 02/13/23 Rt  Shoulder flexion 140  140 AROM: 150   AROM: 156 supine  AROM: 165 supine; 85 in standing before shoulder shrug present     Shoulder extension         Shoulder abduction 80  96  AROM: 111 supine  AROM: 110 supine  PROM 116 supine  AROM: 65 in sitting   Shoulder adduction  Shoulder extension         Shoulder internal rotation 72        Shoulder external rotation 35  46      Elbow flexion         Elbow extension         Wrist flexion         Wrist extension         Wrist ulnar deviation         Wrist radial deviation         Wrist pronation         Wrist supination          (Blank rows = not tested)  R shoulder abduction improved by 10deg following manual PT today.   UPPER EXTREMITY MMT: not formally assess at eval d/t ROM deficits and post-op status  MMT Right eval Left eval  Shoulder flexion    Shoulder extension    Shoulder abduction    Shoulder adduction    Shoulder extension    Shoulder internal rotation    Shoulder external rotation    Middle trapezius    Lower trapezius    Elbow flexion    Elbow extension    Wrist flexion    Wrist extension    Wrist ulnar deviation    Wrist radial deviation    Wrist pronation    Wrist supination    Grip strength     (Blank rows = not tested)   TODAY'S TREATMENT:         [facility] Adult PT Treatment:                                                DATE: 02/16/23 Therapeutic Exercise: Pulleys flexion and abduction x 2 min each  Inclined shoulder flexion 1#; 4 x 5  Sidelying abduction 2#; 2 x 10  Seated shoulder flexion 3 x 5; 1# partial range  Seated shoulder abduction short lever 2 x 10; partial range Seated bicep curl 2 x 8 @ 4 lbs   [facility] Adult PT Treatment:                                                DATE: 02/13/23 Therapeutic Exercise: Pulleys flexion x 2 min each flexion and abduction  Finger ladder flexion/abduction x 5 each  Supine shoulder flexion 2 x 10; 1# Seated shoulder abduction long lever 2 x 10  Standing shoulder flexion AROM long lever 2 x  10  Supine serratus punch 1# 2 x 10  Rhythmic stabilization flexion and ER/IR in scapular plane; 5 x 20 sec each     [facility] Adult PT Treatment:                                                DATE: 02/08/23 Therapeutic Exercise: Pulleys flexion and abduction x 2 minute each  Finger ladder flexion,abduction x 5 each  Inclined shoulder flexion long lever 2 x 10  Rows 2 x 10 green band  Seated shoulder abduction AROM short lever x 10  Standing bicep curl 3#  2 x 10  Standing shoulder flexion long lever 2 x 5  Updated HEP  Manual Therapy: Rt shoulder PROM abduction, ER,IR Rt shoulder GHJ gentle oscillations, grade II inferior,A/P mobilizations    [facility] Adult PT Treatment:                                                DATE: 02/06/23 Therapeutic Exercise: Pulleys flexion and abduction x 2 min each  Supine shoulder flexion AROM 2 x 5; 1# Sidelying shoulder abduction AROM 2 x 10; 1#; partial range  Sidelying ER 2 x 10 @ 1 lb  Seated shoulder flexion AROM long lever approx 45 degrees x 10  Seated bicep curl 2 x 10 @ 2 lbs  Manual Therapy: Rt shoulder PROM to tolerance flexion, abduction, IR, ER Gentle GHJ oscillations GHJ mobilizations grade II inferior, A/P    PATIENT EDUCATION:  Education details: HEP update Person educated: Patient Education method: Explanation, demo Education comprehension: verbalized understanding, returned demo  HOME EXERCISE PROGRAM: Access Code: [email or web address] URL: [email or web address]   ASSESSMENT:  CLINICAL IMPRESSION: Patient tolerated session well today with further progression of shoulder strengthening and ROM. Able to introduce light weight resistance with inclined shoulder flexion with patient able to complete through near full range with good control. With gravity dependent flexion and abduction she is able to complete ROM/light resistance through partial range before shoulder shrug is present.  FOTO score has significantly improved, having  met this LTG.   OBJECTIVE IMPAIRMENTS: decreased activity tolerance, decreased endurance, decreased ROM, decreased strength, impaired UE functional use, postural dysfunction, and pain.   ACTIVITY LIMITATIONS: carrying, lifting, sleeping, bathing, dressing, reach over head, and hygiene/grooming  PARTICIPATION LIMITATIONS: meal prep, cleaning, driving, shopping, community activity, and yard work  PERSONAL FACTORS: Past/current experiences and 1-2 comorbidities: PMHx includes anemia, anxiety/depression, fibromyalgia, HTN, hypothyroidism, vertigo  are also affecting patient's functional outcome.   REHAB POTENTIAL: Good  CLINICAL DECISION MAKING: Evolving/moderate complexity  EVALUATION COMPLEXITY: Moderate   GOALS: Goals reviewed with patient? Yes  SHORT TERM GOALS: Target date: 02/01/2023    Patient will be independent with initial home program for Rt shoulder ROM.  Baseline: provided at eval Goal status: met  2.  Patient will demonstrate improved AROM by 10 degrees in shoulder flexion/abduction.  Baseline: see objective findings Goal status: met    LONG TERM GOALS: Target date: 03/01/2023    Patient will report improved overall functional ability with FOTO score of 55 or greater.  Baseline: 40 Goal status: MET  2.  Patient will report 2/10 or less with ADLs including don/doff bra and washing her back.  Baseline: difficulty related to pain and limited AROM  Goal status: INITIAL  3.  Patient will demonstrate at least 4/5 R UE strength in all directions Baseline: see objective findings  Goal status: INITIAL  4.  Patient will report ability to return to driving 20 minutes or greater without exacerbation of pain. Baseline: limited to short distance and unable to use R arm as normal when turning.  Goal status: INITIAL  5.  Patient will report improved sleep quality without waking d/t R shoulder pain.  Baseline: walking d/t BIL shoulder pain throughout the night Goal status:  INITIAL  6.  Patient will demonstrate ability to push/pull at least 40# with <3/10 pain in order to return to heavy household and yardwork tasks.  Baseline:  unable to sweep/vacuum/push the lawnmower Goal status: INITIAL   PLAN:  PT FREQUENCY: 2x/week  PT DURATION: 8 weeks  PLANNED INTERVENTIONS: Therapeutic exercises, Therapeutic activity, Neuromuscular re-education, Patient/Family education, Self Care, Joint mobilization, Electrical stimulation, Cryotherapy, Moist heat, Manual therapy, and Re-evaluation  PLAN FOR NEXT SESSION: shoulder AROM and strengthening; begin IR ROM as tolerated.   Letitia Libra, PT, DPT, ATC 02/16/23 10:14 AM

## 2023-02-19 ENCOUNTER — Other Ambulatory Visit: Payer: Self-pay | Admitting: Family Medicine

## 2023-02-20 ENCOUNTER — Encounter: Payer: Self-pay | Admitting: Physical Therapy

## 2023-02-20 ENCOUNTER — Ambulatory Visit: Payer: Medicaid Other | Admitting: Physical Therapy

## 2023-02-20 ENCOUNTER — Other Ambulatory Visit: Payer: Self-pay

## 2023-02-20 DIAGNOSIS — G8929 Other chronic pain: Secondary | ICD-10-CM

## 2023-02-20 DIAGNOSIS — M6281 Muscle weakness (generalized): Secondary | ICD-10-CM

## 2023-02-20 DIAGNOSIS — M25511 Pain in right shoulder: Secondary | ICD-10-CM | POA: Diagnosis not present

## 2023-02-20 NOTE — Therapy (Signed)
OUTPATIENT PHYSICAL THERAPY TREATMENT   Patient Name: Denise Morales MRN: [id number] DOB:07-Nov-1960, 62 y.o., female Today's Date: 02/20/2023  END OF SESSION:  PT End of Session - 02/20/23 1021     Visit Number 12    Number of Visits 17    Date for PT Re-Evaluation 03/01/23    Authorization Type [employer]    Authorization Time Period 01/15/23 - 04/15/23    Authorization - Visit Number 11    Authorization - Number of Visits 15    PT Start Time 1015    PT Stop Time 1055    PT Time Calculation (min) 40 min    Activity Tolerance Patient tolerated treatment well    Behavior During Therapy WFL for tasks assessed/performed                Past Medical History:  Diagnosis Date   Allergy    Anemia    Anxiety    Arthritis    Depression    Fibromyalgia    GERD (gastroesophageal reflux disease)    History of hiatal hernia    Hyperlipidemia    Hypertension    Hypothyroidism    Melanoma (HCC)    face   Thyroid disease    Past Surgical History:  Procedure Laterality Date   ABDOMINAL HYSTERECTOMY  1985   AIKEN OSTEOTOMY Right 06/05/2022   Procedure: Quintella Reichert OSTEOTOMY;  Surgeon: Candelaria Stagers, DPM;  Location: [facility];  Service: Podiatry;  Laterality: Right;   BICEPT TENODESIS Right 12/05/2022   Procedure: RIGHT BICEPS TENODESIS;  Surgeon: Cammy Copa, MD;  Location: [facility];  Service: Orthopedics;  Laterality: Right;   CAPSULOTOMY METATARSOPHALANGEAL Right 06/05/2022   Procedure: CAPSULOTOMY METATARSOPHALANGEAL;  Surgeon: Candelaria Stagers, DPM;  Location: [facility];  Service: Podiatry;  Laterality: Right;   CHOLECYSTECTOMY     COLONOSCOPY     HALLUX VALGUS LAPIDUS Right 06/05/2022   Procedure: HALLUX VALGUS LAPIDUS;  Surgeon: Candelaria Stagers, DPM;  Location:  [facility];  Service: Podiatry;  Laterality: Right;   HAMMER TOE SURGERY Right 06/05/2022   Procedure: HAMMER TOE CORRECTION;  Surgeon: Candelaria Stagers, DPM;   Location: [facility] ;  Service: Podiatry;  Laterality: Right;   REVERSE SHOULDER ARTHROPLASTY Right 12/05/2022   Procedure: RIGHT REVERSE SHOULDER ARTHROPLASTY;  Surgeon: Cammy Copa, MD;  Location: [facility];  Service: Orthopedics;  Laterality: Right;   SHOULDER ARTHROSCOPY WITH ROTATOR CUFF REPAIR AND SUBACROMIAL DECOMPRESSION Right 08/12/2020   Procedure: RIGHT SHOULDER ARTHROSCOPY WITH ROTATOR CUFF REPAIR AND SUBACROMIAL DECOMPRESSION;  Surgeon: Kathryne Hitch, MD;  Location: [facility];  Service: Orthopedics;  Laterality: Right;   TOTAL KNEE ARTHROPLASTY Right 12/24/2020   Procedure: RIGHT TOTAL KNEE ARTHROPLASTY;  Surgeon: Kathryne Hitch, MD;  Location: [facility];  Service: Orthopedics;  Laterality: Right;   TOTAL KNEE ARTHROPLASTY Left 07/15/2021   Procedure: LEFT TOTAL KNEE ARTHROPLASTY;  Surgeon: Kathryne Hitch, MD;  Location: [facility];  Service: Orthopedics;  Laterality: Left;   Patient Active Problem List   Diagnosis Date Noted   Arthritis of right shoulder region 12/24/2022   Biceps tendonitis on right 12/24/2022   S/P reverse total shoulder arthroplasty, right 12/05/2022   Vertigo 11/27/2022   Iron deficiency anemia 10/30/2022   Physical exam, annual 10/30/2022   Medication management 10/30/2022   Class 2 severe obesity due to excess calories with serious comorbidity in adult, unspecified BMI (HCC) 10/23/2022   Essential hypertension  10/23/2022   Prediabetes 10/23/2022   Hypothyroidism 10/23/2022   Insomnia 06/14/2022   Status post total left knee replacement 07/15/2021   Neoplasm of uncertain behavior of skin 05/25/2021   Generalized osteoarthritis of multiple sites 05/25/2021   Fibromyalgia syndrome 05/25/2021   Restless legs 03/16/2021   Hyperlipidemia 03/16/2021   Anxiety 03/16/2021   Status post right knee replacement 12/24/2020   Unilateral primary osteoarthritis, left knee 11/11/2020   Unilateral primary  osteoarthritis, right knee 11/11/2020   Complete tear of right rotator cuff 08/12/2020   Hypertensive disorder 05/05/2019   PCP: Park Meo, FNP  REFERRING PROVIDER: Cammy Copa, MD  REFERRING DIAG: History of arthroplasty of right shoulder [Z96.611]   THERAPY DIAG:  Chronic right shoulder pain  Muscle weakness (generalized)  Rationale for Evaluation and Treatment: Rehabilitation  ONSET DATE: 12/05/22 Rt RSA   SUBJECTIVE:                                                                                                                                                                                                        SUBJECTIVE STATEMENT: Patient reports shoulder is doing ok, no pain. She was able to work in the yard yesterday pulling weeds without any issue. She still does not have the range to reach up into an upper shelf.   PERTINENT HISTORY:  PMHx includes anemia, anxiety/depression, fibromyalgia, HTN, hypothyroidism, vertigo  PAIN:  Are you having pain? None currently  PRECAUTIONS: None    OBJECTIVE:  PATIENT SURVEYS:  FOTO 40 current, 55 predicted 02/16/23: 63% function   POSTURE: rounded shoulders  PALPATION: Tenderness to palpation along lateral shoulder related to recent fall on 12/23/22. No significant post-op related tenderness to palpation.    UPPER EXTREMITY ROM:  Active ROM Right eval Left eval 01/18/23 Rt PROM 01/25/23 R 01/30/23 Rt  02/08/23 Rt 02/13/23 Rt Right 02/20/2023  Shoulder flexion 140  140 AROM: 150  AROM: 156 supine  AROM: 165 supine; 85 in standing before shoulder shrug present    90 deg Standing  Shoulder extension          Shoulder abduction 80  96  AROM: 111 supine  AROM: 110 supine  PROM 116 supine  AROM: 65 in sitting    Shoulder adduction          Shoulder extension          Shoulder internal rotation 72       Reach to SIJ  Shoulder external rotation 35  46       Elbow flexion  Elbow extension          Wrist  flexion          Wrist extension          Wrist ulnar deviation          Wrist radial deviation          Wrist pronation          Wrist supination           (Blank rows = not tested)  R shoulder abduction improved by 10deg following manual PT today.   UPPER EXTREMITY MMT: not formally assess at eval d/t ROM deficits and post-op status  MMT Right eval Left eval  Shoulder flexion    Shoulder extension    Shoulder abduction    Shoulder adduction    Shoulder extension    Shoulder internal rotation    Shoulder external rotation    Middle trapezius    Lower trapezius    Elbow flexion    Elbow extension    Wrist flexion    Wrist extension    Wrist ulnar deviation    Wrist radial deviation    Wrist pronation    Wrist supination    Grip strength     (Blank rows = not tested)   TODAY'S TREATMENT:         [facility] Adult PT Treatment:                                                DATE: 02/20/23 Therapeutic Exercise: Pulleys flexion x 2 min Standing wall walk with physioball x 10 Inclined shoulder flexion @ 40 deg x 10, @ 45 deg with 1# 2 x 10 Sidelying abduction with 2# and palm down 2 x 10  Sidelying ER with 1# 2 x 10 Standing dowel extension 2 x 10 Standing dowel IR pulling across back 2 x 10 Standing dowel slide up back 2 x 10 Stanidng wall slide with lift off x 10 Row with blue 2 x 10   [facility] Adult PT Treatment:                                                DATE: 02/16/23 Therapeutic Exercise: Pulleys flexion and abduction x 2 min each  Inclined shoulder flexion 1#; 4 x 5  Sidelying abduction 2#; 2 x 10  Seated shoulder flexion 3 x 5; 1# partial range  Seated shoulder abduction short lever 2 x 10; partial range Seated bicep curl 2 x 8 @ 4 lbs   PATIENT EDUCATION:  Education details: HEP update Person educated: Patient Education method: Explanation, Handout Education comprehension: verbalized understanding, returned demo  HOME EXERCISE PROGRAM: Access Code:  [email or web address] URL: [email or web address]   ASSESSMENT: CLINICAL IMPRESSION: Patient tolerated session well today with further progression of shoulder strengthening and ROM. She does continue to exhibit limitations with right shoulder elevation and initiated reach behind her back this visit. She does exhibit difficulty with reach behind her back so provided updated HEP to progress her stretching and ability to reach behind. She is able to perform inclined shoulder flexion with weight through greater range than upright so continue strengthening in inclined position. Patient would benefit from continued skilled PT to progress  her shoulder mobility and strength in order to maximize functional ability.   OBJECTIVE IMPAIRMENTS: decreased activity tolerance, decreased endurance, decreased ROM, decreased strength, impaired UE functional use, postural dysfunction, and pain.   ACTIVITY LIMITATIONS: carrying, lifting, sleeping, bathing, dressing, reach over head, and hygiene/grooming  PARTICIPATION LIMITATIONS: meal prep, cleaning, driving, shopping, community activity, and yard work  PERSONAL FACTORS: Past/current experiences and 1-2 comorbidities: PMHx includes anemia, anxiety/depression, fibromyalgia, HTN, hypothyroidism, vertigo  are also affecting patient's functional outcome.    GOALS: Goals reviewed with patient? Yes  SHORT TERM GOALS: Target date: 02/01/2023   Patient will be independent with initial home program for Rt shoulder ROM.  Baseline: provided at eval Goal status: met  2.  Patient will demonstrate improved AROM by 10 degrees in shoulder flexion/abduction.  Baseline: see objective findings Goal status: met  LONG TERM GOALS: Target date: 03/01/2023   Patient will report improved overall functional ability with FOTO score of 55 or greater.  Baseline: 40 Goal status: MET  2.  Patient will report 2/10 or less with ADLs including don/doff bra and washing her back.   Baseline: difficulty related to pain and limited AROM  Goal status: INITIAL  3.  Patient will demonstrate at least 4/5 R UE strength in all directions Baseline: see objective findings  Goal status: INITIAL  4.  Patient will report ability to return to driving 20 minutes or greater without exacerbation of pain. Baseline: limited to short distance and unable to use R arm as normal when turning.  Goal status: INITIAL  5.  Patient will report improved sleep quality without waking d/t R shoulder pain.  Baseline: walking d/t BIL shoulder pain throughout the night Goal status: INITIAL  6.  Patient will demonstrate ability to push/pull at least 40# with <3/10 pain in order to return to heavy household and yardwork tasks.  Baseline: unable to sweep/vacuum/push the lawnmower Goal status: INITIAL   PLAN: PT FREQUENCY: 2x/week  PT DURATION: 8 weeks  PLANNED INTERVENTIONS: Therapeutic exercises, Therapeutic activity, Neuromuscular re-education, Patient/Family education, Self Care, Joint mobilization, Electrical stimulation, Cryotherapy, Moist heat, Manual therapy, and Re-evaluation  PLAN FOR NEXT SESSION: shoulder AROM and strengthening; progress IR ROM as tolerated.    Rosana Hoes, PT, DPT, LAT, ATC 02/20/23  11:00 AM Phone: [phone] Fax: [phone]

## 2023-02-20 NOTE — Patient Instructions (Signed)
Access Code: [email or web address] URL: [email or web address] Date: 02/20/2023 Prepared by: Rosana Hoes  Exercises - Seated Shoulder Abduction Towel Slide at Table Top  - 1 x daily - 7 x weekly - 1 sets - 10 reps - 3 sec hold - Supine Shoulder Flexion Extension AAROM with Dowel  - 1 x daily - 7 x weekly - 1 sets - 10 reps - 5 sec  hold - Supine Shoulder Flexion Extension Full Range AROM  - 1 x daily - 7 x weekly - 1 sets - 10 reps - Sidelying Shoulder Abduction  - 1 x daily - 7 x weekly - 1 sets - 10 reps - Sidelying Shoulder External Rotation  - 1 x daily - 7 x weekly - 1 sets - 10 reps - Shoulder Flexion Wall Slide with Towel  - 1 x daily - 7 x weekly - 1 sets - 10 reps - 5 sec  hold - Standing Single Arm Elbow Flexion with Resistance  - 1 x daily - 7 x weekly - 2 sets - 10 reps - Standing Elbow Extension with Self-Anchored Resistance  - 1 x daily - 7 x weekly - 2 sets - 10 reps - Standing Shoulder Row with Anchored Resistance  - 1 x daily - 7 x weekly - 2 sets - 10 reps - Standing Shoulder Flexion to 90 Degrees  - 1 x daily - 7 x weekly - 2 sets - 5 reps - Seated Shoulder Abduction with Bent Elbow  - 1 x daily - 7 x weekly - 2 sets - 10 reps - Standing Shoulder Extension with Dowel  - 2 x daily - 2 sets - 10 reps - Standing Shoulder Internal Rotation AAROM with Dowel  - 2 x daily - 2 sets - 10 reps - Standing Bilateral Shoulder Internal Rotation AAROM with Dowel  - 2 x daily - 2 sets - 10 reps

## 2023-02-21 ENCOUNTER — Encounter: Payer: Self-pay | Admitting: Orthopedic Surgery

## 2023-02-21 ENCOUNTER — Ambulatory Visit: Payer: Medicaid Other | Admitting: Orthopedic Surgery

## 2023-02-21 DIAGNOSIS — M19012 Primary osteoarthritis, left shoulder: Secondary | ICD-10-CM | POA: Diagnosis not present

## 2023-02-21 DIAGNOSIS — M19011 Primary osteoarthritis, right shoulder: Secondary | ICD-10-CM

## 2023-02-21 NOTE — Therapy (Signed)
OUTPATIENT PHYSICAL THERAPY TREATMENT   Patient Name: Denise Morales MRN: [id number] DOB:08-Dec-1960, 62 y.o., female Today's Date: 02/22/2023  END OF SESSION:  PT End of Session - 02/22/23 0912     Visit Number 13    Number of Visits 17    Date for PT Re-Evaluation 03/01/23    Authorization Type [employer]    Authorization Time Period 01/15/23 - 04/15/23    Authorization - Visit Number 12    Authorization - Number of Visits 15    PT Start Time 0915    PT Stop Time 0953    PT Time Calculation (min) 38 min    Activity Tolerance Patient limited by pain    Behavior During Therapy [facility] for tasks assessed/performed                 Past Medical History:  Diagnosis Date   Allergy    Anemia    Anxiety    Arthritis    Depression    Fibromyalgia    GERD (gastroesophageal reflux disease)    History of hiatal hernia    Hyperlipidemia    Hypertension    Hypothyroidism    Melanoma (HCC)    face   Thyroid disease    Past Surgical History:  Procedure Laterality Date   ABDOMINAL HYSTERECTOMY  1985   AIKEN OSTEOTOMY Right 06/05/2022   Procedure: Quintella Reichert OSTEOTOMY;  Surgeon: Candelaria Stagers, DPM;  Location: [facility];  Service: Podiatry;  Laterality: Right;   BICEPT TENODESIS Right 12/05/2022   Procedure: RIGHT BICEPS TENODESIS;  Surgeon: Cammy Copa, MD;  Location: [facility];  Service: Orthopedics;  Laterality: Right;   CAPSULOTOMY METATARSOPHALANGEAL Right 06/05/2022   Procedure: CAPSULOTOMY METATARSOPHALANGEAL;  Surgeon: Candelaria Stagers, DPM;  Location: [facility];  Service: Podiatry;  Laterality: Right;   CHOLECYSTECTOMY     COLONOSCOPY     HALLUX VALGUS LAPIDUS Right 06/05/2022   Procedure: HALLUX VALGUS LAPIDUS;  Surgeon: Candelaria Stagers, DPM;  Location: [facility];  Service: Podiatry;  Laterality: Right;   HAMMER TOE SURGERY Right 06/05/2022   Procedure: HAMMER TOE CORRECTION;  Surgeon: Candelaria Stagers, DPM;   Location: [facility];  Service: Podiatry;  Laterality: Right;   REVERSE SHOULDER ARTHROPLASTY Right 12/05/2022   Procedure: RIGHT REVERSE SHOULDER ARTHROPLASTY;  Surgeon: Cammy Copa, MD;  Location: [facility];  Service: Orthopedics;  Laterality: Right;   SHOULDER ARTHROSCOPY WITH ROTATOR CUFF REPAIR AND SUBACROMIAL DECOMPRESSION Right 08/12/2020   Procedure: RIGHT SHOULDER ARTHROSCOPY WITH ROTATOR CUFF REPAIR AND SUBACROMIAL DECOMPRESSION;  Surgeon: Kathryne Hitch, MD;  Location: [facility];  Service: Orthopedics;  Laterality: Right;   TOTAL KNEE ARTHROPLASTY Right 12/24/2020   Procedure: RIGHT TOTAL KNEE ARTHROPLASTY;  Surgeon: Kathryne Hitch, MD;  Location: [facility];  Service: Orthopedics;  Laterality: Right;   TOTAL KNEE ARTHROPLASTY Left 07/15/2021   Procedure: LEFT TOTAL KNEE ARTHROPLASTY;  Surgeon: Kathryne Hitch, MD;  Location: [facility];  Service: Orthopedics;  Laterality: Left;   Patient Active Problem List   Diagnosis Date Noted   Arthritis of right shoulder region 12/24/2022   Biceps tendonitis on right 12/24/2022   S/P reverse total shoulder arthroplasty, right 12/05/2022   Vertigo 11/27/2022   Iron deficiency anemia 10/30/2022   Physical exam, annual 10/30/2022   Medication management 10/30/2022   Class 2 severe obesity due to excess calories with serious comorbidity in adult, unspecified BMI (HCC) 10/23/2022   Essential  hypertension 10/23/2022   Prediabetes 10/23/2022   Hypothyroidism 10/23/2022   Insomnia 06/14/2022   Status post total left knee replacement 07/15/2021   Neoplasm of uncertain behavior of skin 05/25/2021   Generalized osteoarthritis of multiple sites 05/25/2021   Fibromyalgia syndrome 05/25/2021   Restless legs 03/16/2021   Hyperlipidemia 03/16/2021   Anxiety 03/16/2021   Status post right knee replacement 12/24/2020   Unilateral primary osteoarthritis, left knee 11/11/2020   Unilateral primary  osteoarthritis, right knee 11/11/2020   Complete tear of right rotator cuff 08/12/2020   Hypertensive disorder 05/05/2019   PCP: Park Meo, FNP  REFERRING PROVIDER: Cammy Copa, MD  REFERRING DIAG: History of arthroplasty of right shoulder [Z96.611]   THERAPY DIAG:  Chronic right shoulder pain  Muscle weakness (generalized)  Rationale for Evaluation and Treatment: Rehabilitation  ONSET DATE: 12/05/22 Rt RSA   SUBJECTIVE:                                                                                                                                                                                                        SUBJECTIVE STATEMENT: Denise Morales reports that she has been noticing good improvement with normal daily activities. She continues to have difficulty reaching behind her back. She was seen by Dr. August Saucer. Pt states that she was released regarding her Rt shoulder, but he is planning to schedule L shoulder arthoplasty after her recovery from upcoming hernia surgery.    PERTINENT HISTORY:  PMHx includes anemia, anxiety/depression, fibromyalgia, HTN, hypothyroidism, vertigo  PAIN:  Are you having pain? None currently  PRECAUTIONS: None    OBJECTIVE:  PATIENT SURVEYS:  FOTO 40 current, 55 predicted 02/16/23: 63% function   POSTURE: rounded shoulders  PALPATION: Tenderness to palpation along lateral shoulder related to recent fall on 12/23/22. No significant post-op related tenderness to palpation.    UPPER EXTREMITY ROM:  Active ROM Right eval Left eval 01/18/23 Rt PROM 01/25/23 R 01/30/23 Rt  02/08/23 Rt 02/13/23 Rt Right 02/20/2023  Shoulder flexion 140  140 AROM: 150  AROM: 156 supine  AROM: 165 supine; 85 in standing before shoulder shrug present    90 deg Standing  Shoulder extension          Shoulder abduction 80  96  AROM: 111 supine  AROM: 110 supine  PROM 116 supine  AROM: 65 in sitting    Shoulder adduction          Shoulder extension           Shoulder internal rotation 72       Reach  to SIJ  Shoulder external rotation 35  46       Elbow flexion          Elbow extension          Wrist flexion          Wrist extension          Wrist ulnar deviation          Wrist radial deviation          Wrist pronation          Wrist supination           (Blank rows = not tested)  R shoulder abduction improved by 10deg following manual PT today.   UPPER EXTREMITY MMT: not formally assess at eval d/t ROM deficits and post-op status  MMT Right eval Left eval  Shoulder flexion    Shoulder extension    Shoulder abduction    Shoulder adduction    Shoulder extension    Shoulder internal rotation    Shoulder external rotation    Middle trapezius    Lower trapezius    Elbow flexion    Elbow extension    Wrist flexion    Wrist extension    Wrist ulnar deviation    Wrist radial deviation    Wrist pronation    Wrist supination    Grip strength     (Blank rows = not tested)   TODAY'S TREATMENT:       [facility] Adult PT Treatment:                                                DATE: 02/22/2023  Therapeutic Exercise: Pulleys flexion x 2 min Standing wall walk with physioball x 10 Sidelying abduction (palm down) with 2#, 2 x 10  Sidelying ER with 1# 2 x 10 Standing dowel extension 2 x 10 Standing IR wall crawl x 10   Manual Therapy  STM to deltoid mm  PROM Er/IR  Therapeutic Activity Discussion and assessment of progression towards established goals; planning for end of POC d/t upcoming surgical procedures   [facility] Adult PT Treatment:                                                DATE: 02/20/23 Therapeutic Exercise: Pulleys flexion x 2 min Standing wall walk with physioball x 10 Inclined shoulder flexion @ 40 deg x 10, @ 45 deg with 1# 2 x 10 Sidelying abduction with 2# and palm down 2 x 10  Sidelying ER with 1# 2 x 10 Standing dowel extension 2 x 10 Standing dowel IR pulling across back 2 x 10 Standing dowel slide up back  2 x 10 Stanidng wall slide with lift off x 10 Row with blue 2 x 10   [facility] Adult PT Treatment:                                                DATE: 02/16/23 Therapeutic Exercise: Pulleys flexion and abduction x 2 min each  Inclined shoulder flexion 1#; 4 x 5  Sidelying  abduction 2#; 2 x 10  Seated shoulder flexion 3 x 5; 1# partial range  Seated shoulder abduction short lever 2 x 10; partial range Seated bicep curl 2 x 8 @ 4 lbs   PATIENT EDUCATION:  Education details: HEP update Person educated: Patient Education method: Explanation, Handout Education comprehension: verbalized understanding, returned demo  HOME EXERCISE PROGRAM: Access Code: [email or web address] URL: [email or web address]   ASSESSMENT: CLINICAL IMPRESSION: Denise Morales was limited during today's session d/t increased pain and overall muscle fatigue. She had decreased muscle tension following manual therapy. Discussed progress towards LTG and anticipated d/c date. Plan is to discharge at end of scheduled visits d/t upcoming surgical procedures. We will continue to progress exercises as indicated and maximize independence with home exercise program with remaining visits.     OBJECTIVE IMPAIRMENTS: decreased activity tolerance, decreased endurance, decreased ROM, decreased strength, impaired UE functional use, postural dysfunction, and pain.   ACTIVITY LIMITATIONS: carrying, lifting, sleeping, bathing, dressing, reach over head, and hygiene/grooming  PARTICIPATION LIMITATIONS: meal prep, cleaning, driving, shopping, community activity, and yard work  PERSONAL FACTORS: Past/current experiences and 1-2 comorbidities: PMHx includes anemia, anxiety/depression, fibromyalgia, HTN, hypothyroidism, vertigo  are also affecting patient's functional outcome.    GOALS: Goals reviewed with patient? Yes  SHORT TERM GOALS: Target date: 02/01/2023   Patient will be independent with initial home program for Rt shoulder ROM.   Baseline: provided at eval Goal status: met  2.  Patient will demonstrate improved AROM by 10 degrees in shoulder flexion/abduction.  Baseline: see objective findings Goal status: met  LONG TERM GOALS: Target date: 03/01/2023   Patient will report improved overall functional ability with FOTO score of 55 or greater.  Baseline: 40 02/16/23: 63 Goal status: MET  2.  Patient will report 2/10 or less with ADLs including don/doff bra and washing her back.  Baseline: difficulty related to pain and limited AROM  02/22/23: difficulty with washing her back, and using modified approach to bra don/doff Goal status: ONGOING  3.  Patient will demonstrate at least 4/5 R UE strength in all directions Baseline: see objective findings  Goal status: INITIAL  4.  Patient will report ability to return to driving 20 minutes or greater without exacerbation of pain. Baseline: limited to short distance and unable to use R arm as normal when turning.  02/22/23: not limited  Goal status: MET  5.  Patient will report improved sleep quality without waking d/t R shoulder pain.  Baseline: walking d/t BIL shoulder pain throughout the night 02/22/23: "I still have to sleep in the recliner, but there are night I wake up because I'm hurting."  Goal status: ONGOING   6.  Patient will demonstrate ability to push/pull at least 40# with <3/10 pain in order to return to heavy household and yardwork tasks.  Baseline: unable to sweep/vacuum/push the lawnmower 02/22/23: able to sweep/vacuum; haven't tried lawnmower at this time Goal status: ONGOING   PLAN: PT FREQUENCY: 2x/week  PT DURATION: 8 weeks  PLANNED INTERVENTIONS: Therapeutic exercises, Therapeutic activity, Neuromuscular re-education, Patient/Family education, Self Care, Joint mobilization, Electrical stimulation, Cryotherapy, Moist heat, Manual therapy, and Re-evaluation  PLAN FOR NEXT SESSION: shoulder AROM and strengthening; progress IR ROM as tolerated.     Mauri Reading, PT, DPT   02/22/23  10:05 AM Phone: [phone] Fax: [phone]

## 2023-02-21 NOTE — Progress Notes (Deleted)
Office Visit Note   Patient: Denise Morales           Date of Birth: 1961/01/28           MRN: [id number] Visit Date: 02/21/2023 Requested by: Park Meo, FNP [address] [place],  [place] [address] PCP: Park Meo, FNP  Subjective: Chief Complaint  Patient presents with   Right Shoulder - Routine Post Op    HPI: Denise Morales is a 62 y.o. female who presents to the office reporting ***.                ROS: All systems reviewed are negative as they relate to the chief complaint within the history of present illness.  Patient denies fevers or chills.  Assessment & Plan: Visit Diagnoses:  1. Primary osteoarthritis, right shoulder   2. Primary osteoarthritis, left shoulder     Plan: ***  Follow-Up Instructions: No follow-ups on file.   Orders:  No orders of the defined types were placed in this encounter.  No orders of the defined types were placed in this encounter.     Procedures: No procedures performed   Clinical Data: No additional findings.  Objective: Vital Signs: There were no vitals taken for this visit.  Physical Exam:  Constitutional: Patient appears well-developed HEENT:  Head: Normocephalic Eyes:EOM are normal Neck: Normal range of motion Cardiovascular: Normal rate Pulmonary/chest: Effort normal Neurologic: Patient is alert Skin: Skin is warm Psychiatric: Patient has normal mood and affect  Ortho Exam: ***  Specialty Comments:  EXAM: MRI LUMBAR SPINE WITHOUT CONTRAST   TECHNIQUE: Multiplanar, multisequence MR imaging of the lumbar spine was performed. No intravenous contrast was administered.   COMPARISON:  None.   FINDINGS: Segmentation:  Standard.   Alignment:  Physiologic.   Vertebrae:  No fracture, evidence of discitis, or bone lesion.   Conus medullaris and cauda equina: Conus extends to the T12 level. Conus and cauda equina appear normal.   Paraspinal and other soft tissues: Negative.   Disc  levels:   T12-L1: No spinal canal or neural foraminal stenosis.   L1-2: Shallow disc bulge. No spinal canal or neural foraminal stenosis.   L2-3: Shallow disc bulge and mild facet degenerative changes. No spinal canal neural foraminal stenosis.   L3-4: Shallow disc bulge and mild facet degenerative changes. No significant spinal canal neural foraminal stenosis.   L4-5: Shallow disc bulge, advanced facet degenerative changes ligamentum flavum redundancy without significant spinal canal or neural foraminal stenosis.   L5-S1: Advanced hypertrophic facet degenerative changes, more pronounced on the right side without significant spinal canal or neural foraminal stenosis.   IMPRESSION: 1. Advanced hypertrophic facet degenerative changes at the L4-5 and L5-S1 levels, which may be a source of low back pain. 2. Mild multilevel degenerative disc disease without significant spinal canal or neural foraminal stenosis at any level.     Electronically Signed   By: Baldemar Lenis M.D.   On: 06/14/2020 17:10  Imaging: No results found.   PMFS History: Patient Active Problem List   Diagnosis Date Noted   Arthritis of right shoulder region 12/24/2022   Biceps tendonitis on right 12/24/2022   S/P reverse total shoulder arthroplasty, right 12/05/2022   Vertigo 11/27/2022   Iron deficiency anemia 10/30/2022   Physical exam, annual 10/30/2022   Medication management 10/30/2022   Class 2 severe obesity due to excess calories with serious comorbidity in adult, unspecified BMI (HCC) 10/23/2022   Essential hypertension  10/23/2022   Prediabetes 10/23/2022   Hypothyroidism 10/23/2022   Insomnia 06/14/2022   Status post total left knee replacement 07/15/2021   Neoplasm of uncertain behavior of skin 05/25/2021   Generalized osteoarthritis of multiple sites 05/25/2021   Fibromyalgia syndrome 05/25/2021   Restless legs 03/16/2021   Hyperlipidemia 03/16/2021   Anxiety  03/16/2021   Status post right knee replacement 12/24/2020   Unilateral primary osteoarthritis, left knee 11/11/2020   Unilateral primary osteoarthritis, right knee 11/11/2020   Complete tear of right rotator cuff 08/12/2020   Hypertensive disorder 05/05/2019   Past Medical History:  Diagnosis Date   Allergy    Anemia    Anxiety    Arthritis    Depression    Fibromyalgia    GERD (gastroesophageal reflux disease)    History of hiatal hernia    Hyperlipidemia    Hypertension    Hypothyroidism    Melanoma (HCC)    face   Thyroid disease     Family History  Problem Relation Age of Onset   Dementia Mother    Heart Problems Mother    Stroke Mother    Hypertension Mother    Hypertension Father    Colon polyps Father    Diabetes Father    Hypertension Sister    Diabetes Sister        borderline   Fibromyalgia Sister    Hypertension Sister    Colon polyps Sister    Diabetes Sister    Fibromyalgia Sister    Heart Problems Sister    Arthritis Sister    Hypertension Sister    Heart Problems Sister    Healthy Daughter    Diabetes Paternal Aunt    Colon cancer Neg Hx    Esophageal cancer Neg Hx    Rectal cancer Neg Hx    Stomach cancer Neg Hx    Breast cancer Neg Hx     Past Surgical History:  Procedure Laterality Date   ABDOMINAL HYSTERECTOMY  1985   AIKEN OSTEOTOMY Right 06/05/2022   Procedure: Quintella Reichert OSTEOTOMY;  Surgeon: Candelaria Stagers, DPM;  Location: [facility];  Service: Podiatry;  Laterality: Right;   BICEPT TENODESIS Right 12/05/2022   Procedure: RIGHT BICEPS TENODESIS;  Surgeon: Cammy Copa, MD;  Location: [facility] OR;  Service: Orthopedics;  Laterality: Right;   CAPSULOTOMY METATARSOPHALANGEAL Right 06/05/2022   Procedure: CAPSULOTOMY METATARSOPHALANGEAL;  Surgeon: Candelaria Stagers, DPM;  Location: [facility];  Service: Podiatry;  Laterality: Right;   CHOLECYSTECTOMY     COLONOSCOPY     HALLUX VALGUS LAPIDUS Right 06/05/2022    Procedure: HALLUX VALGUS LAPIDUS;  Surgeon: Candelaria Stagers, DPM;  Location: [facility];  Service: Podiatry;  Laterality: Right;   HAMMER TOE SURGERY Right 06/05/2022   Procedure: HAMMER TOE CORRECTION;  Surgeon: Candelaria Stagers, DPM;  Location: [facility];  Service: Podiatry;  Laterality: Right;   REVERSE SHOULDER ARTHROPLASTY Right 12/05/2022   Procedure: RIGHT REVERSE SHOULDER ARTHROPLASTY;  Surgeon: Cammy Copa, MD;  Location: [facility] OR;  Service: Orthopedics;  Laterality: Right;   SHOULDER ARTHROSCOPY WITH ROTATOR CUFF REPAIR AND SUBACROMIAL DECOMPRESSION Right 08/12/2020   Procedure: RIGHT SHOULDER ARTHROSCOPY WITH ROTATOR CUFF REPAIR AND SUBACROMIAL DECOMPRESSION;  Surgeon: Kathryne Hitch, MD;  Location: [facility];  Service: Orthopedics;  Laterality: Right;   TOTAL KNEE ARTHROPLASTY Right 12/24/2020   Procedure: RIGHT TOTAL KNEE ARTHROPLASTY;  Surgeon: Kathryne Hitch, MD;  Location: [facility];  Service: Orthopedics;  Laterality: Right;   TOTAL KNEE ARTHROPLASTY Left 07/15/2021   Procedure: LEFT TOTAL KNEE ARTHROPLASTY;  Surgeon: Kathryne Hitch, MD;  Location: [facility];  Service: Orthopedics;  Laterality: Left;   Social History   Occupational History   Not on file  Tobacco Use   Smoking status: Never   Smokeless tobacco: Never  Vaping Use   Vaping status: Never Used  Substance and Sexual Activity   Alcohol use: Not Currently   Drug use: Never   Sexual activity: Not Currently

## 2023-02-22 ENCOUNTER — Ambulatory Visit: Payer: Medicaid Other

## 2023-02-22 DIAGNOSIS — M25511 Pain in right shoulder: Secondary | ICD-10-CM | POA: Diagnosis not present

## 2023-02-22 DIAGNOSIS — M6281 Muscle weakness (generalized): Secondary | ICD-10-CM

## 2023-02-22 DIAGNOSIS — G8929 Other chronic pain: Secondary | ICD-10-CM

## 2023-02-23 ENCOUNTER — Other Ambulatory Visit: Payer: Self-pay | Admitting: Orthopedic Surgery

## 2023-02-23 DIAGNOSIS — M19012 Primary osteoarthritis, left shoulder: Secondary | ICD-10-CM

## 2023-02-23 NOTE — Progress Notes (Signed)
Post-Op Visit Note   Patient: Denise Morales           Date of Birth: 01-28-1961           MRN: [id number] Visit Date: 02/21/2023 PCP: Park Meo, FNP   Assessment & Plan:  Chief Complaint:  Chief Complaint  Patient presents with   Right Shoulder - Routine Post Op   Visit Diagnoses:  1. Primary osteoarthritis, right shoulder   2. Primary osteoarthritis, left shoulder     Plan: Wreatha is a patient underwent right reverse shoulder replacement 12/05/2022.  She has occasional pain.  Just taking Tylenol.  Still some difficulty reaching up and behind the back.  She is in physical therapy.  Doing some strengthening.  She reports increased pain in the left shoulder.  She wants left shoulder surgery as soon as possible.  She does have left severe glenohumeral joint arthritis.  Has hernia surgery posted for 916.  Right shoulder demonstrates improved range of motion above shoulder level.  Her motion is 45/80/135.  Subscap strength is intact.  Plan at this time is thin cut CT scan for preop left reverse shoulder replacement and we will posterior for early November.  Risk and benefits are discussed.  All questions answered.  Continue with therapy and home exercise program to optimize right reverse shoulder replacement prior to her left shoulder surgery.  Follow-Up Instructions: No follow-ups on file.   Orders:  No orders of the defined types were placed in this encounter.  No orders of the defined types were placed in this encounter.   Imaging: No results found.  PMFS History: Patient Active Problem List   Diagnosis Date Noted   Arthritis of right shoulder region 12/24/2022   Biceps tendonitis on right 12/24/2022   S/P reverse total shoulder arthroplasty, right 12/05/2022   Vertigo 11/27/2022   Iron deficiency anemia 10/30/2022   Physical exam, annual 10/30/2022   Medication management 10/30/2022   Class 2 severe obesity due to excess calories with serious comorbidity in  adult, unspecified BMI (HCC) 10/23/2022   Essential hypertension 10/23/2022   Prediabetes 10/23/2022   Hypothyroidism 10/23/2022   Insomnia 06/14/2022   Status post total left knee replacement 07/15/2021   Neoplasm of uncertain behavior of skin 05/25/2021   Generalized osteoarthritis of multiple sites 05/25/2021   Fibromyalgia syndrome 05/25/2021   Restless legs 03/16/2021   Hyperlipidemia 03/16/2021   Anxiety 03/16/2021   Status post right knee replacement 12/24/2020   Unilateral primary osteoarthritis, left knee 11/11/2020   Unilateral primary osteoarthritis, right knee 11/11/2020   Complete tear of right rotator cuff 08/12/2020   Hypertensive disorder 05/05/2019   Past Medical History:  Diagnosis Date   Allergy    Anemia    Anxiety    Arthritis    Depression    Fibromyalgia    GERD (gastroesophageal reflux disease)    History of hiatal hernia    Hyperlipidemia    Hypertension    Hypothyroidism    Melanoma (HCC)    face   Thyroid disease     Family History  Problem Relation Age of Onset   Dementia Mother    Heart Problems Mother    Stroke Mother    Hypertension Mother    Hypertension Father    Colon polyps Father    Diabetes Father    Hypertension Sister    Diabetes Sister        borderline   Fibromyalgia Sister    Hypertension Sister  Colon polyps Sister    Diabetes Sister    Fibromyalgia Sister    Heart Problems Sister    Arthritis Sister    Hypertension Sister    Heart Problems Sister    Healthy Daughter    Diabetes Paternal Aunt    Colon cancer Neg Hx    Esophageal cancer Neg Hx    Rectal cancer Neg Hx    Stomach cancer Neg Hx    Breast cancer Neg Hx     Past Surgical History:  Procedure Laterality Date   ABDOMINAL HYSTERECTOMY  1985   AIKEN OSTEOTOMY Right 06/05/2022   Procedure: Quintella Reichert OSTEOTOMY;  Surgeon: Candelaria Stagers, DPM;  Location: [facility];  Service: Podiatry;  Laterality: Right;   BICEPT TENODESIS Right 12/05/2022    Procedure: RIGHT BICEPS TENODESIS;  Surgeon: Cammy Copa, MD;  Location: [facility] OR;  Service: Orthopedics;  Laterality: Right;   CAPSULOTOMY METATARSOPHALANGEAL Right 06/05/2022   Procedure: CAPSULOTOMY METATARSOPHALANGEAL;  Surgeon: Candelaria Stagers, DPM;  Location: [facility];  Service: Podiatry;  Laterality: Right;   CHOLECYSTECTOMY     COLONOSCOPY     HALLUX VALGUS LAPIDUS Right 06/05/2022   Procedure: HALLUX VALGUS LAPIDUS;  Surgeon: Candelaria Stagers, DPM;  Location: [facility];  Service: Podiatry;  Laterality: Right;   HAMMER TOE SURGERY Right 06/05/2022   Procedure: HAMMER TOE CORRECTION;  Surgeon: Candelaria Stagers, DPM;  Location: [facility];  Service: Podiatry;  Laterality: Right;   REVERSE SHOULDER ARTHROPLASTY Right 12/05/2022   Procedure: RIGHT REVERSE SHOULDER ARTHROPLASTY;  Surgeon: Cammy Copa, MD;  Location: [facility] OR;  Service: Orthopedics;  Laterality: Right;   SHOULDER ARTHROSCOPY WITH ROTATOR CUFF REPAIR AND SUBACROMIAL DECOMPRESSION Right 08/12/2020   Procedure: RIGHT SHOULDER ARTHROSCOPY WITH ROTATOR CUFF REPAIR AND SUBACROMIAL DECOMPRESSION;  Surgeon: Kathryne Hitch, MD;  Location: [facility];  Service: Orthopedics;  Laterality: Right;   TOTAL KNEE ARTHROPLASTY Right 12/24/2020   Procedure: RIGHT TOTAL KNEE ARTHROPLASTY;  Surgeon: Kathryne Hitch, MD;  Location: [facility];  Service: Orthopedics;  Laterality: Right;   TOTAL KNEE ARTHROPLASTY Left 07/15/2021   Procedure: LEFT TOTAL KNEE ARTHROPLASTY;  Surgeon: Kathryne Hitch, MD;  Location: [facility];  Service: Orthopedics;  Laterality: Left;   Social History   Occupational History   Not on file  Tobacco Use   Smoking status: Never   Smokeless tobacco: Never  Vaping Use   Vaping status: Never Used  Substance and Sexual Activity   Alcohol use: Not Currently   Drug use: Never   Sexual activity: Not Currently

## 2023-02-27 ENCOUNTER — Other Ambulatory Visit: Payer: Self-pay | Admitting: Orthopedic Surgery

## 2023-02-27 ENCOUNTER — Ambulatory Visit: Payer: Medicaid Other | Attending: Orthopedic Surgery

## 2023-02-27 DIAGNOSIS — G8929 Other chronic pain: Secondary | ICD-10-CM | POA: Diagnosis present

## 2023-02-27 DIAGNOSIS — M25511 Pain in right shoulder: Secondary | ICD-10-CM | POA: Diagnosis present

## 2023-02-27 NOTE — Therapy (Signed)
OUTPATIENT PHYSICAL THERAPY TREATMENT   Patient Name: Denise Morales MRN: [id number] DOB:Jun 26, 1961, 62 y.o., female Today's Date: 02/27/2023  END OF SESSION:  PT End of Session - 02/27/23 0914     Visit Number 14    Number of Visits 17    Date for PT Re-Evaluation 03/01/23    Authorization Type [employer]    Authorization - Visit Number 13    Authorization - Number of Visits 15    PT Start Time [phone]    PT Stop Time 0959    PT Time Calculation (min) 43 min    Activity Tolerance Patient tolerated treatment well    Behavior During Therapy WFL for tasks assessed/performed                  Past Medical History:  Diagnosis Date   Allergy    Anemia    Anxiety    Arthritis    Depression    Fibromyalgia    GERD (gastroesophageal reflux disease)    History of hiatal hernia    Hyperlipidemia    Hypertension    Hypothyroidism    Melanoma (HCC)    face   Thyroid disease    Past Surgical History:  Procedure Laterality Date   ABDOMINAL HYSTERECTOMY  1985   AIKEN OSTEOTOMY Right 06/05/2022   Procedure: Quintella Reichert OSTEOTOMY;  Surgeon: Candelaria Stagers, DPM;  Location: [facility];  Service: Podiatry;  Laterality: Right;   BICEPT TENODESIS Right 12/05/2022   Procedure: RIGHT BICEPS TENODESIS;  Surgeon: Cammy Copa, MD;  Location: [facility] OR;  Service: Orthopedics;  Laterality: Right;   CAPSULOTOMY METATARSOPHALANGEAL Right 06/05/2022   Procedure: CAPSULOTOMY METATARSOPHALANGEAL;  Surgeon: Candelaria Stagers, DPM;  Location: [facility];  Service: Podiatry;  Laterality: Right;   CHOLECYSTECTOMY     COLONOSCOPY     HALLUX VALGUS LAPIDUS Right 06/05/2022   Procedure: HALLUX VALGUS LAPIDUS;  Surgeon: Candelaria Stagers, DPM;  Location: [facility];  Service: Podiatry;  Laterality: Right;   HAMMER TOE SURGERY Right 06/05/2022   Procedure: HAMMER TOE CORRECTION;  Surgeon: Candelaria Stagers, DPM;  Location: [facility];   Service: Podiatry;  Laterality: Right;   REVERSE SHOULDER ARTHROPLASTY Right 12/05/2022   Procedure: RIGHT REVERSE SHOULDER ARTHROPLASTY;  Surgeon: Cammy Copa, MD;  Location: [facility] OR;  Service: Orthopedics;  Laterality: Right;   SHOULDER ARTHROSCOPY WITH ROTATOR CUFF REPAIR AND SUBACROMIAL DECOMPRESSION Right 08/12/2020   Procedure: RIGHT SHOULDER ARTHROSCOPY WITH ROTATOR CUFF REPAIR AND SUBACROMIAL DECOMPRESSION;  Surgeon: Kathryne Hitch, MD;  Location: [facility];  Service: Orthopedics;  Laterality: Right;   TOTAL KNEE ARTHROPLASTY Right 12/24/2020   Procedure: RIGHT TOTAL KNEE ARTHROPLASTY;  Surgeon: Kathryne Hitch, MD;  Location: [facility];  Service: Orthopedics;  Laterality: Right;   TOTAL KNEE ARTHROPLASTY Left 07/15/2021   Procedure: LEFT TOTAL KNEE ARTHROPLASTY;  Surgeon: Kathryne Hitch, MD;  Location: [facility];  Service: Orthopedics;  Laterality: Left;   Patient Active Problem List   Diagnosis Date Noted   Arthritis of right shoulder region 12/24/2022   Biceps tendonitis on right 12/24/2022   S/P reverse total shoulder arthroplasty, right 12/05/2022   Vertigo 11/27/2022   Iron deficiency anemia 10/30/2022   Physical exam, annual 10/30/2022   Medication management 10/30/2022   Class 2 severe obesity due to excess calories with serious comorbidity in adult, unspecified BMI (HCC) 10/23/2022   Essential hypertension 10/23/2022   Prediabetes 10/23/2022  Hypothyroidism 10/23/2022   Insomnia 06/14/2022   Status post total left knee replacement 07/15/2021   Neoplasm of uncertain behavior of skin 05/25/2021   Generalized osteoarthritis of multiple sites 05/25/2021   Fibromyalgia syndrome 05/25/2021   Restless legs 03/16/2021   Hyperlipidemia 03/16/2021   Anxiety 03/16/2021   Status post right knee replacement 12/24/2020   Unilateral primary osteoarthritis, left knee 11/11/2020   Unilateral primary osteoarthritis, right knee 11/11/2020    Complete tear of right rotator cuff 08/12/2020   Hypertensive disorder 05/05/2019   PCP: Park Meo, FNP  REFERRING PROVIDER: Cammy Copa, MD  REFERRING DIAG: History of arthroplasty of right shoulder [Z96.611]   THERAPY DIAG:  Chronic right shoulder pain  Rationale for Evaluation and Treatment: Rehabilitation  ONSET DATE: 12/05/22 Rt RSA   SUBJECTIVE:                                                                                                                                                                                                        SUBJECTIVE STATEMENT: Pt reports that she has been complaint with her home exercises. She feels that her mobility is continuing to improve. Is agreeable to d/c from skilled PT at next visit.     PERTINENT HISTORY:  PMHx includes anemia, anxiety/depression, fibromyalgia, HTN, hypothyroidism, vertigo  PAIN:  Are you having pain? None currently  PRECAUTIONS: None    OBJECTIVE:  PATIENT SURVEYS:  FOTO 40 current, 55 predicted 02/16/23: 63% function    POSTURE: rounded shoulders  PALPATION: Tenderness to palpation along lateral shoulder related to recent fall on 12/23/22. No significant post-op related tenderness to palpation.    UPPER EXTREMITY ROM:  Active ROM Right eval Left eval 01/18/23 Rt PROM 01/25/23 R 01/30/23 Rt  02/08/23 Rt 02/13/23 Rt Right 02/20/2023  Shoulder flexion 140  140 AROM: 150  AROM: 156 supine  AROM: 165 supine; 85 in standing before shoulder shrug present    90 deg Standing  Shoulder extension          Shoulder abduction 80  96  AROM: 111 supine  AROM: 110 supine  PROM 116 supine  AROM: 65 in sitting    Shoulder adduction          Shoulder extension          Shoulder internal rotation 72       Reach to SIJ  Shoulder external rotation 35  46       Elbow flexion          Elbow extension  Wrist flexion          Wrist extension          Wrist ulnar deviation          Wrist radial  deviation          Wrist pronation          Wrist supination           (Blank rows = not tested)  R shoulder abduction improved by 10deg following manual PT today.   UPPER EXTREMITY MMT: not formally assess at eval d/t ROM deficits and post-op status  MMT Right eval Left eval  Shoulder flexion    Shoulder extension    Shoulder abduction    Shoulder adduction    Shoulder extension    Shoulder internal rotation    Shoulder external rotation    Middle trapezius    Lower trapezius    Elbow flexion    Elbow extension    Wrist flexion    Wrist extension    Wrist ulnar deviation    Wrist radial deviation    Wrist pronation    Wrist supination    Grip strength     (Blank rows = not tested)   TODAY'S TREATMENT:      [facility] Adult PT Treatment:                                                DATE: 02/27/23  Therapeutic Exercise: UBE level 1 x fwd/back, 2 min each  Finger Ladder x 10 fwd/scaption each  Seated dowel extension, 2 x 10 Seated dowel ER, 2 x 10  Seated shoulder flexion 1#, 2 x 10  Seated shoulder abduction 1#, 2 x 10  Seated shoulder ER 1#, 2 x 10  Pulleys flexion/scaption x 2 min each  Manual Therapy  STM to deltoid mm  PROM Er/IR   [facility] Adult PT Treatment:                                                DATE: 02/22/2023  Therapeutic Exercise: Pulleys flexion x 2 min Standing wall walk with physioball x 10 Sidelying abduction (palm down) with 2#, 2 x 10  Sidelying ER with 1# 2 x 10 Standing dowel extension 2 x 10 Standing IR wall crawl x 10   Manual Therapy  STM to deltoid mm  PROM Er/IR  Therapeutic Activity Discussion and assessment of progression towards established goals; planning for end of POC d/t upcoming surgical procedures   [facility] Adult PT Treatment:                                                DATE: 02/20/23 Therapeutic Exercise: Pulleys flexion x 2 min Standing wall walk with physioball x 10 Inclined shoulder flexion @ 40 deg x 10, @ 45  deg with 1# 2 x 10 Sidelying abduction with 2# and palm down 2 x 10  Sidelying ER with 1# 2 x 10 Standing dowel extension 2 x 10 Standing dowel IR pulling across back 2 x 10 Standing dowel slide up back 2  x 10 Stanidng wall slide with lift off x 10 Row with blue 2 x 10    PATIENT EDUCATION:  Education details: HEP update Person educated: Patient Education method: Explanation, Handout Education comprehension: verbalized understanding, returned demo  HOME EXERCISE PROGRAM: Access Code: [email or web address] URL: [email or web address]   ASSESSMENT: CLINICAL IMPRESSION: Lake is demonstrating improved tolerance of manual therapy, as clinician was able to perform ER/IR PROM without significant pain. She was able to tolerate exercises well today, including progression to seated shoulder flexion and abduction strengthening. However, she was unable to tolerate 2# dumbbell and discussed ways to work on this strength until next visit.    OBJECTIVE IMPAIRMENTS: decreased activity tolerance, decreased endurance, decreased ROM, decreased strength, impaired UE functional use, postural dysfunction, and pain.   ACTIVITY LIMITATIONS: carrying, lifting, sleeping, bathing, dressing, reach over head, and hygiene/grooming  PARTICIPATION LIMITATIONS: meal prep, cleaning, driving, shopping, community activity, and yard work  PERSONAL FACTORS: Past/current experiences and 1-2 comorbidities: PMHx includes anemia, anxiety/depression, fibromyalgia, HTN, hypothyroidism, vertigo  are also affecting patient's functional outcome.    GOALS: Goals reviewed with patient? Yes  SHORT TERM GOALS: Target date: 02/01/2023   Patient will be independent with initial home program for Rt shoulder ROM.  Baseline: provided at eval Goal status: met  2.  Patient will demonstrate improved AROM by 10 degrees in shoulder flexion/abduction.  Baseline: see objective findings Goal status: met  LONG TERM GOALS: Target  date: 03/01/2023   Patient will report improved overall functional ability with FOTO score of 55 or greater.  Baseline: 40 02/16/23: 63 Goal status: MET  2.  Patient will report 2/10 or less with ADLs including don/doff bra and washing her back.  Baseline: difficulty related to pain and limited AROM  02/22/23: difficulty with washing her back, and using modified approach to bra don/doff Goal status: ONGOING  3.  Patient will demonstrate at least 4/5 R UE strength in all directions Baseline: see objective findings  Goal status: INITIAL  4.  Patient will report ability to return to driving 20 minutes or greater without exacerbation of pain. Baseline: limited to short distance and unable to use R arm as normal when turning.  02/22/23: not limited  Goal status: MET  5.  Patient will report improved sleep quality without waking d/t R shoulder pain.  Baseline: walking d/t BIL shoulder pain throughout the night 02/22/23: "I still have to sleep in the recliner, but there are night I wake up because I'm hurting."  Goal status: ONGOING   6.  Patient will demonstrate ability to push/pull at least 40# with <3/10 pain in order to return to heavy household and yardwork tasks.  Baseline: unable to sweep/vacuum/push the lawnmower 02/22/23: able to sweep/vacuum; haven't tried lawnmower at this time Goal status: ONGOING   PLAN: PT FREQUENCY: 2x/week  PT DURATION: 8 weeks  PLANNED INTERVENTIONS: Therapeutic exercises, Therapeutic activity, Neuromuscular re-education, Patient/Family education, Self Care, Joint mobilization, Electrical stimulation, Cryotherapy, Moist heat, Manual therapy, and Re-evaluation  PLAN FOR NEXT SESSION: shoulder AROM and strengthening; progress IR ROM as tolerated.    Mauri Reading, PT, DPT   02/27/23  12:49 PM Phone: [phone] Fax: [phone]

## 2023-02-27 NOTE — Telephone Encounter (Signed)
Prescription Request  02/27/2023  LOV: 01/01/2023  What is the name of the medication or equipment?   levothyroxine (SYNTHROID) 50 MCG tablet   pregabalin (LYRICA) 75 MG capsule [[id number]]  **90 day scripts needed**  Have you contacted your pharmacy to request a refill? Yes   Which pharmacy would you like this sent to?  [id number] - [place] ([place]), [place] - [address]  ([place]) [place] [address] Phone: [phone] Fax: [phone]    Patient notified that their request is being sent to the clinical staff for review and that they should receive a response within 2 business days.   Please advise pharmacist

## 2023-03-01 ENCOUNTER — Ambulatory Visit: Payer: Medicaid Other

## 2023-03-01 ENCOUNTER — Other Ambulatory Visit: Payer: Self-pay | Admitting: Family Medicine

## 2023-03-01 DIAGNOSIS — G8929 Other chronic pain: Secondary | ICD-10-CM

## 2023-03-01 DIAGNOSIS — M25511 Pain in right shoulder: Secondary | ICD-10-CM | POA: Diagnosis not present

## 2023-03-01 NOTE — Progress Notes (Addendum)
COVID Vaccine received:  []  No [x]  Yes Date of any COVID positive Test in last 90 days: No PCP - Kurtis Bushman FNP Cardiologist - no  Chest x-ray -  EKG -  10/09/22 Epic Stress Test - 11/18/21 Epic ECHO - 10/25/21 Epic Cardiac Cath - no  Bowel Prep - [x]  No  []   Yes ______  Pacemaker / ICD device [x]  No []  Yes   Spinal Cord Stimulator:[x]  No []  Yes       History of Sleep Apnea? [x]  No []  Yes   CPAP used?- [x]  No []  Yes    Does the patient monitor blood sugar?          [x]  No []  Yes  []  N/A  Patient has: [x]  NO Hx DM   []  Pre-DM                 []  DM1  []   DM2 Does patient have a [employer] or Dexacom? []  No []  Yes   Fasting Blood Sugar Ranges-  Checks Blood Sugar _____ times a day  GLP1 agonist / usual dose - no GLP1 instructions:  SGLT-2 inhibitors / usual dose - no SGLT-2 instructions:   Blood Thinner / Instructions:no Aspirin Instructions:no  Comments:   Activity level: Patient is able  to climb a flight of stairs without difficulty; [x]  No CP  [x]  No SOB,  Patient can  perform ADLs without assistance.   Anesthesia review:   Patient denies shortness of breath, fever, cough and chest pain at PAT appointment.  Patient verbalized understanding and agreement to the Pre-Surgical Instructions that were given to them at this PAT appointment. Patient was also educated of the need to review these PAT instructions again prior to his/her surgery.I reviewed the appropriate phone numbers to call if they have any and questions or concerns.

## 2023-03-01 NOTE — Therapy (Addendum)
OUTPATIENT PHYSICAL THERAPY TREATMENT and Discharge Note  PHYSICAL THERAPY DISCHARGE SUMMARY  Visits from Start of Care: 15  Current functional level related to goals / functional outcomes: See objective findings/assessment    Remaining deficits: See objective findings/assessment    Education / Equipment: See today's treatment/assessment    Patient agrees to discharge. Patient goals were partially met. Patient is being discharged due to being pleased with the current functional level.    Patient Name: Denise Morales MRN: [id number] DOB:19-Mar-1961, 62 y.o., female Today's Date: 03/01/2023  END OF SESSION:  PT End of Session - 03/01/23 1044     Visit Number 15    Number of Visits 17    Date for PT Re-Evaluation 03/01/23    Authorization Type [employer]    Authorization Time Period 01/15/23 - 04/15/23    Authorization - Visit Number 14    Authorization - Number of Visits 15    PT Start Time 1045    PT Stop Time 1125    PT Time Calculation (min) 40 min    Activity Tolerance Patient tolerated treatment well    Behavior During Therapy WFL for tasks assessed/performed                  Past Medical History:  Diagnosis Date   Allergy    Anemia    Anxiety    Arthritis    Depression    Fibromyalgia    GERD (gastroesophageal reflux disease)    History of hiatal hernia    Hyperlipidemia    Hypertension    Hypothyroidism    Melanoma (HCC)    face   Thyroid disease    Past Surgical History:  Procedure Laterality Date   ABDOMINAL HYSTERECTOMY  1985   AIKEN OSTEOTOMY Right 06/05/2022   Procedure: Quintella Reichert OSTEOTOMY;  Surgeon: Candelaria Stagers, DPM;  Location: [facility];  Service: Podiatry;  Laterality: Right;   BICEPT TENODESIS Right 12/05/2022   Procedure: RIGHT BICEPS TENODESIS;  Surgeon: Cammy Copa, MD;  Location: [facility];  Service: Orthopedics;  Laterality: Right;   CAPSULOTOMY METATARSOPHALANGEAL Right 06/05/2022   Procedure:  CAPSULOTOMY METATARSOPHALANGEAL;  Surgeon: Candelaria Stagers, DPM;  Location: [facility];  Service: Podiatry;  Laterality: Right;   CHOLECYSTECTOMY     COLONOSCOPY     HALLUX VALGUS LAPIDUS Right 06/05/2022   Procedure: HALLUX VALGUS LAPIDUS;  Surgeon: Candelaria Stagers, DPM;  Location: [facility];  Service: Podiatry;  Laterality: Right;   HAMMER TOE SURGERY Right 06/05/2022   Procedure: HAMMER TOE CORRECTION;  Surgeon: Candelaria Stagers, DPM;  Location: [facility] [place];  Service: Podiatry;  Laterality: Right;   REVERSE SHOULDER ARTHROPLASTY Right 12/05/2022   Procedure: RIGHT REVERSE SHOULDER ARTHROPLASTY;  Surgeon: Cammy Copa, MD;  Location: [facility];  Service: Orthopedics;  Laterality: Right;   SHOULDER ARTHROSCOPY WITH ROTATOR CUFF REPAIR AND SUBACROMIAL DECOMPRESSION Right 08/12/2020   Procedure: RIGHT SHOULDER ARTHROSCOPY WITH ROTATOR CUFF REPAIR AND SUBACROMIAL DECOMPRESSION;  Surgeon: Kathryne Hitch, MD;  Location: [facility];  Service: Orthopedics;  Laterality: Right;   TOTAL KNEE ARTHROPLASTY Right 12/24/2020   Procedure: RIGHT TOTAL KNEE ARTHROPLASTY;  Surgeon: Kathryne Hitch, MD;  Location: [facility];  Service: Orthopedics;  Laterality: Right;   TOTAL KNEE ARTHROPLASTY Left 07/15/2021   Procedure: LEFT TOTAL KNEE ARTHROPLASTY;  Surgeon: Kathryne Hitch, MD;  Location: [facility];  Service: Orthopedics;  Laterality: Left;   Patient Active Problem List  Diagnosis Date Noted   Arthritis of right shoulder region 12/24/2022   Biceps tendonitis on right 12/24/2022   S/P reverse total shoulder arthroplasty, right 12/05/2022   Vertigo 11/27/2022   Iron deficiency anemia 10/30/2022   Physical exam, annual 10/30/2022   Medication management 10/30/2022   Class 2 severe obesity due to excess calories with serious comorbidity in adult, unspecified BMI (HCC) 10/23/2022   Essential hypertension 10/23/2022   Prediabetes  10/23/2022   Hypothyroidism 10/23/2022   Insomnia 06/14/2022   Status post total left knee replacement 07/15/2021   Neoplasm of uncertain behavior of skin 05/25/2021   Generalized osteoarthritis of multiple sites 05/25/2021   Fibromyalgia syndrome 05/25/2021   Restless legs 03/16/2021   Hyperlipidemia 03/16/2021   Anxiety 03/16/2021   Status post right knee replacement 12/24/2020   Unilateral primary osteoarthritis, left knee 11/11/2020   Unilateral primary osteoarthritis, right knee 11/11/2020   Complete tear of right rotator cuff 08/12/2020   Hypertensive disorder 05/05/2019   PCP: Park Meo, FNP  REFERRING PROVIDER: Cammy Copa, MD  REFERRING DIAG: History of arthroplasty of right shoulder [Z96.611]   THERAPY DIAG:  Chronic right shoulder pain  Rationale for Evaluation and Treatment: Rehabilitation  ONSET DATE: 12/05/22 Rt RSA   SUBJECTIVE:                                                                                                                                                                                                        SUBJECTIVE STATEMENT: Pt reporting that since her last visit, she was able to mow her lawn. She has most difficulty with sleeping in her bed and wakes with pain if not sleeping in the recliner. Maygen is agreeable to plan for d/c from skilled PT at this time d/t current progress and upcoming surgeries. She plans to return to our clinic in [place] after Lt shoulder surgery.     PERTINENT HISTORY:  PMHx includes anemia, anxiety/depression, fibromyalgia, HTN, hypothyroidism, vertigo  PAIN:  Are you having pain? None currently  PRECAUTIONS: None    OBJECTIVE:  PATIENT SURVEYS:  FOTO 40 current, 55 predicted 02/16/23: 63% function    POSTURE: rounded shoulders  PALPATION: Tenderness to palpation along lateral shoulder related to recent fall on 12/23/22. No significant post-op related tenderness to palpation.     UPPER EXTREMITY ROM:  Active ROM Right eval 01/18/23 Rt PROM 01/25/23 R 01/30/23 Rt  02/08/23 Rt 02/13/23 Rt Right 02/20/2023 Right 03/01/23  Shoulder flexion 140 140 AROM: 150  AROM: 156 supine  AROM: 165 supine; 85 in standing before shoulder  shrug present    90 deg Standing AROM 165 supine    Shoulder extension          Shoulder abduction 80 96  AROM: 111 supine  AROM: 110 supine  PROM 116 supine  AROM: 65 in sitting   AROM 110 supine  Shoulder adduction          Shoulder extension          Shoulder internal rotation 72      Reach to SIJ   Shoulder external rotation 35 46        Elbow flexion          Elbow extension          Wrist flexion          Wrist extension          Wrist ulnar deviation          Wrist radial deviation          Wrist pronation          Wrist supination           (Blank rows = not tested)  R shoulder abduction improved by 10deg following manual PT today.   UPPER EXTREMITY MMT: not formally assessed at eval d/t ROM deficits and post-op status   TODAY'S TREATMENT:      [facility] Adult PT Treatment:                                                DATE: 02/27/23  Therapeutic Exercise: UBE level 1 x fwd/back, 2 min each  Pulleys flexion/scaption x 2 min each Finger Ladder x 5 fwd/scaption each   Manual Therapy  PROM Er/IR, flexion, aduction, D2 flexion  Therapeutic Activity:  Reassessment of objective measures and subjective assessment regarding progress towards established goals and plan for independence with prescribed home program following discharged from PT    [facility] Adult PT Treatment:                                                DATE: 02/22/2023  Therapeutic Exercise: Pulleys flexion x 2 min Standing wall walk with physioball x 10 Sidelying abduction (palm down) with 2#, 2 x 10  Sidelying ER with 1# 2 x 10 Standing dowel extension 2 x 10 Standing IR wall crawl x 10   Manual Therapy  STM to deltoid mm  PROM Er/IR  Therapeutic  Activity Discussion and assessment of progression towards established goals; planning for end of POC d/t upcoming surgical procedures    PATIENT EDUCATION:  Education details: HEP update Person educated: Patient Education method: Explanation, Handout Education comprehension: verbalized understanding, returned demo  HOME EXERCISE PROGRAM: Access Code: [email or web address] URL: [email or web address]   ASSESSMENT: CLINICAL IMPRESSION: Kiaya has attended 15 total PT sessions s/p Rt RSA. She has been compliant with prescribed home program and consistent with attendance. She has met the majority of established rehab goals, including ability to perform bathing, dressing, and mowing her lawn without exacerbation of symptoms. Patient will be discharged from skilled PT at this time and is expected to remain independent with home program.     OBJECTIVE IMPAIRMENTS: decreased activity tolerance, decreased endurance, decreased ROM, decreased strength, impaired UE functional  use, postural dysfunction, and pain.   ACTIVITY LIMITATIONS: carrying, lifting, sleeping, bathing, dressing, reach over head, and hygiene/grooming  PARTICIPATION LIMITATIONS: meal prep, cleaning, driving, shopping, community activity, and yard work  PERSONAL FACTORS: Past/current experiences and 1-2 comorbidities: PMHx includes anemia, anxiety/depression, fibromyalgia, HTN, hypothyroidism, vertigo  are also affecting patient's functional outcome.    GOALS: Goals reviewed with patient? Yes  SHORT TERM GOALS: Target date: 02/01/2023   Patient will be independent with initial home program for Rt shoulder ROM.  Baseline: provided at eval Goal status: met  2.  Patient will demonstrate improved AROM by 10 degrees in shoulder flexion/abduction.  Baseline: see objective findings Goal status: met  LONG TERM GOALS: Target date: 03/01/2023   Patient will report improved overall functional ability with FOTO score of 55 or  greater.  Baseline: 40 02/16/23: 63 Goal status: MET  2.  Patient will report 2/10 or less with ADLs including don/doff bra and washing her back.  Baseline: difficulty related to pain and limited AROM  02/22/23: difficulty with washing her back, and using modified approach to bra don/doff Goal status: MET  3.  Patient will demonstrate at least 4/5 R UE strength in all directions Baseline: see objective findings  03/01/23: 4-/5 shoulder flexion MMT  Goal status: NEARLY MET   4.  Patient will report ability to return to driving 20 minutes or greater without exacerbation of pain. Baseline: limited to short distance and unable to use R arm as normal when turning.  02/22/23: not limited  Goal status: MET  5.  Patient will report improved sleep quality without waking d/t R shoulder pain.  Baseline: walking d/t BIL shoulder pain throughout the night 02/22/23: "I still have to sleep in the recliner, but there are night I wake up because I'm hurting."  Goal status: NOT MET   6.  Patient will demonstrate ability to push/pull at least 40# with <3/10 pain in order to return to heavy household and yardwork tasks.  Baseline: unable to sweep/vacuum/push the lawnmower 02/22/23: able to sweep/vacuum; haven't tried lawnmower at this time 03/01/23: "I was able to mow the lawn. It went good."  Goal status: MET   PLAN: PT FREQUENCY: 2x/week  PT DURATION: 8 weeks  PLANNED INTERVENTIONS: Therapeutic exercises, Therapeutic activity, Neuromuscular re-education, Patient/Family education, Self Care, Joint mobilization, Electrical stimulation, Cryotherapy, Moist heat, Manual therapy, and Re-evaluation    Mauri Reading, PT, DPT   03/01/23  1:16 PM Phone: [phone] Fax: [phone]

## 2023-03-01 NOTE — Patient Instructions (Addendum)
SURGICAL WAITING ROOM VISITATION  Patients having surgery or a procedure may have no more than 2 support people in the waiting area - these visitors may rotate.    Children under the age of 70 must have an adult with them who is not the patient.  Due to an increase in RSV and influenza rates and associated hospitalizations, children ages 49 and under may not visit patients in [facility] hospitals.  If the patient needs to stay at the hospital during part of their recovery, the visitor guidelines for inpatient rooms apply. Pre-op nurse will coordinate an appropriate time for 1 support person to accompany patient in pre-op.  This support person may not rotate.    Please refer to the [facility] website for the visitor guidelines for Inpatients (after your surgery is over and you are in a regular room).       Your procedure is scheduled on: 03/12/23   Report to [facility] Main Entrance    Report to admitting at 6:45 AM   Call this number if you have problems the morning of surgery [phone]   Do not eat food or drink liquids after midnight.    Oral Hygiene is also important to reduce your risk of infection.                                    Remember - BRUSH YOUR TEETH THE MORNING OF SURGERY WITH YOUR REGULAR TOOTHPASTE   Stop all vitamins and herbal supplements 7 days before surgery.   Take these medicines the morning of surgery with A SIP OF WATER: Buspar, Bentyl, Famotidine, Levothyroxine, Pantoprazole, Lyrica, Tylenol if              You may not have any metal on your body including hair pins, jewelry, and body piercing             Do not wear make-up, lotions, powders, perfumes, or deodorant  Do not wear nail polish including gel and S&S, artificial/acrylic nails, or any other type of covering on natural nails including finger and toenails. If you have artificial nails, gel coating, etc. that needs to be removed by a nail salon please have this removed prior to  surgery or surgery may need to be canceled/ delayed if the surgeon/ anesthesia feels like they are unable to be safely monitored.   Do not shave  48 hours prior to surgery.    Do not bring valuables to the hospital. [facility] IS NOT             RESPONSIBLE   FOR VALUABLES.   Contacts, glasses, dentures or bridgework may not be worn into surgery.   Bring small overnight bag day of surgery.   DO NOT BRING YOUR HOME MEDICATIONS TO THE HOSPITAL. PHARMACY WILL DISPENSE MEDICATIONS LISTED ON YOUR MEDICATION LIST TO YOU DURING YOUR ADMISSION IN THE HOSPITAL!    Patients discharged on the day of surgery will not be allowed to drive home.  Someone NEEDS to stay with you for the first 24 hours after anesthesia.   Special Instructions: Bring a copy of your healthcare power of attorney and living will documents the day of surgery if you haven't scanned them before.              Please read over the following fact sheets you were given: IF YOU HAVE QUESTIONS ABOUT YOUR PRE-OP INSTRUCTIONS PLEASE  CALL [phone] Denise Morales   If you received a COVID test during your pre-op visit  it is requested that you wear a mask when out in public, stay away from anyone that may not be feeling well and notify your surgeon if you develop symptoms. If you test positive for Covid or have been in contact with anyone that has tested positive in the last 10 days please notify you surgeon.    [facility] - Preparing for Surgery Before surgery, you can play an important role.  Because skin is not sterile, your skin needs to be as free of germs as possible.  You can reduce the number of germs on your skin by washing with CHG (chlorahexidine gluconate) soap before surgery.  CHG is an antiseptic cleaner which kills germs and bonds with the skin to continue killing germs even after washing. Please DO NOT use if you have an allergy to CHG or antibacterial soaps.  If your skin becomes reddened/irritated stop using the CHG and inform  your nurse when you arrive at Short Stay. Do not shave (including legs and underarms) for at least 48 hours prior to the first CHG shower.  You may shave your face/neck.  Please follow these instructions carefully:  1.  Shower with CHG Soap the night before surgery and the  morning of surgery.  2.  If you choose to wash your hair, wash your hair first as usual with your normal  shampoo.  3.  After you shampoo, rinse your hair and body thoroughly to remove the shampoo.                             4.  Use CHG as you would any other liquid soap.  You can apply chg directly to the skin and wash.  Gently with a scrungie or clean washcloth.  5.  Apply the CHG Soap to your body ONLY FROM THE NECK DOWN.   Do   not use on face/ open                           Wound or open sores. Avoid contact with eyes, ears mouth and   genitals (private parts).                       Wash face,  Genitals (private parts) with your normal soap.             6.  Wash thoroughly, paying special attention to the area where your    surgery  will be performed.  7.  Thoroughly rinse your body with warm water from the neck down.  8.  DO NOT shower/wash with your normal soap after using and rinsing off the CHG Soap.                9.  Pat yourself dry with a clean towel.            10.  Wear clean pajamas.            11.  Place clean sheets on your bed the night of your first shower and do not  sleep with pets. Day of Surgery : Do not apply any lotions/deodorants the morning of surgery.  Please wear clean clothes to the hospital/surgery center.  FAILURE TO FOLLOW THESE INSTRUCTIONS MAY RESULT IN THE CANCELLATION OF YOUR SURGERY  PATIENT SIGNATURE_________________________________  NURSE  SIGNATURE__________________________________  ________________________________________________________________________

## 2023-03-02 ENCOUNTER — Other Ambulatory Visit: Payer: Self-pay

## 2023-03-02 ENCOUNTER — Encounter (HOSPITAL_COMMUNITY): Payer: Self-pay

## 2023-03-02 ENCOUNTER — Encounter (HOSPITAL_COMMUNITY)
Admission: RE | Admit: 2023-03-02 | Discharge: 2023-03-02 | Disposition: A | Discharge status: Home / Self Care | Payer: Medicaid Other | Source: Ambulatory Visit | Attending: Surgery | Admitting: Surgery

## 2023-03-02 VITALS — BP 143/93 | HR 66 | Resp 16 | Ht 59.0 in | Wt 173.0 lb

## 2023-03-02 DIAGNOSIS — Z01812 Encounter for preprocedural laboratory examination: Secondary | ICD-10-CM | POA: Insufficient documentation

## 2023-03-02 DIAGNOSIS — Z01818 Encounter for other preprocedural examination: Secondary | ICD-10-CM | POA: Diagnosis present

## 2023-03-02 DIAGNOSIS — I10 Essential (primary) hypertension: Secondary | ICD-10-CM | POA: Insufficient documentation

## 2023-03-02 LAB — CBC
HCT: 37.4 % (ref 36.0–46.0)
Hemoglobin: 11.7 g/dL — ABNORMAL LOW (ref 12.0–15.0)
MCH: 27.5 pg (ref 26.0–34.0)
MCHC: 31.3 g/dL (ref 30.0–36.0)
MCV: 87.8 fL (ref 80.0–100.0)
Platelets: 267 10*3/uL (ref 150–400)
RBC: 4.26 MIL/uL (ref 3.87–5.11)
RDW: 14.4 % (ref 11.5–15.5)
WBC: 6.5 10*3/uL (ref 4.0–10.5)
nRBC: 0 % (ref 0.0–0.2)

## 2023-03-02 LAB — BASIC METABOLIC PANEL
Anion gap: 9 (ref 5–15)
BUN: 20 mg/dL (ref 8–23)
CO2: 26 mmol/L (ref 22–32)
Calcium: 8.8 mg/dL — ABNORMAL LOW (ref 8.9–10.3)
Chloride: 105 mmol/L (ref 98–111)
Creatinine, Ser: 1.1 mg/dL — ABNORMAL HIGH (ref 0.44–1.00)
GFR, Estimated: 56 mL/min — ABNORMAL LOW (ref >60)
Glucose, Bld: 95 mg/dL (ref 70–99)
Potassium: 4.1 mmol/L (ref 3.5–5.1)
Sodium: 140 mmol/L (ref 135–145)

## 2023-03-05 ENCOUNTER — Other Ambulatory Visit: Payer: Self-pay | Admitting: Family Medicine

## 2023-03-05 DIAGNOSIS — M797 Fibromyalgia: Secondary | ICD-10-CM

## 2023-03-06 ENCOUNTER — Ambulatory Visit
Admission: RE | Admit: 2023-03-06 | Discharge: 2023-03-06 | Disposition: A | Discharge status: Home / Self Care | Payer: Medicaid Other | Source: Ambulatory Visit | Attending: Orthopedic Surgery | Admitting: Orthopedic Surgery

## 2023-03-06 ENCOUNTER — Other Ambulatory Visit: Payer: Self-pay | Admitting: Family Medicine

## 2023-03-06 DIAGNOSIS — M797 Fibromyalgia: Secondary | ICD-10-CM

## 2023-03-06 DIAGNOSIS — M19012 Primary osteoarthritis, left shoulder: Secondary | ICD-10-CM

## 2023-03-06 NOTE — Telephone Encounter (Signed)
Prescription Request  03/06/2023  LOV: 01/01/2023  What is the name of the medication or equipment? levothyroxine (SYNTHROID) 50 MCG tablet [[id number]]  Have you contacted your pharmacy to request a refill? No   Which pharmacy would you like this sent to?  [id number] - [place] ([place]), [place] - [address] [place] ([place]) [place] [address] Phone: [phone] Fax: [phone]    Patient notified that their request is being sent to the clinical staff for review and that they should receive a response within 2 business days.   Please advise at [facility] [phone]

## 2023-03-11 NOTE — Anesthesia Preprocedure Evaluation (Addendum)
Anesthesia Evaluation  Patient identified by MRN, date of birth, ID band Patient awake    Reviewed: Allergy & Precautions, NPO status , Patient's Chart, lab work & pertinent test results  Airway Mallampati: II  TM Distance: >3 FB Neck ROM: Full    Dental  (+) Dental Advisory Given   Pulmonary neg pulmonary ROS   Pulmonary exam normal breath sounds clear to auscultation       Cardiovascular hypertension, Pt. on medications Normal cardiovascular exam Rhythm:Regular Rate:Normal  Exercise treadmill stress test 11/18/2021: Exercise treadmill stress test performed using Bruce protocol.  Patient reached 5.1 METS, and 91% of age predicted maximum heart rate.  Exercise capacity was low.  No chest pain reported.  Normal heart rate and hemodynamic response. Stress EKG revealed no ischemic changes. Intermediate risk stress test due to poor exercise capacity.    Echocardiogram 10/25/2021:  Normal LV systolic function with visual EF 60-65%. Left ventricle cavity  is normal in size. Normal global wall motion. Normal diastolic filling  pattern, normal LAP. Mild left ventricular hypertrophy.  Mild (Grade I) aortic regurgitation.  No prior study for comparison.    Coronary calcium 10/2021  IMPRESSION: 1. Coronary calcium score is 0. 2. Small hiatal hernia.    Neuro/Psych  PSYCHIATRIC DISORDERS Anxiety Depression     Neuromuscular disease    GI/Hepatic Neg liver ROS, hiatal hernia,GERD  ,,  Endo/Other  Hypothyroidism    Renal/GU negative Renal ROS     Musculoskeletal  (+) Arthritis ,  Fibromyalgia -  Abdominal  (+) + obese  Peds  Hematology  (+) Blood dyscrasia, anemia   Anesthesia Other Findings   Reproductive/Obstetrics                             Anesthesia Physical Anesthesia Plan  ASA: 3  Anesthesia Plan: General   Post-op Pain Management: Tylenol PO (pre-op)*   Induction:  Intravenous  PONV Risk Score and Plan: 2 and Dexamethasone, Ondansetron and Treatment may vary due to age or medical condition  Airway Management Planned: Oral ETT  Additional Equipment:   Intra-op Plan:   Post-operative Plan: Extubation in OR  Informed Consent: I have reviewed the patients History and Physical, chart, labs and discussed the procedure including the risks, benefits and alternatives for the proposed anesthesia with the patient or authorized representative who has indicated his/her understanding and acceptance.     Dental advisory given  Plan Discussed with: CRNA  Anesthesia Plan Comments:        Anesthesia Quick Evaluation

## 2023-03-12 ENCOUNTER — Ambulatory Visit (HOSPITAL_COMMUNITY)
Admission: RE | Admit: 2023-03-12 | Discharge: 2023-03-13 | Disposition: A | Discharge status: Home / Self Care | Payer: Medicaid Other | Source: Ambulatory Visit | Attending: Surgery | Admitting: Surgery

## 2023-03-12 ENCOUNTER — Encounter (HOSPITAL_COMMUNITY): Payer: Self-pay | Admitting: Surgery

## 2023-03-12 ENCOUNTER — Other Ambulatory Visit: Payer: Self-pay

## 2023-03-12 ENCOUNTER — Ambulatory Visit (HOSPITAL_BASED_OUTPATIENT_CLINIC_OR_DEPARTMENT_OTHER): Payer: Medicaid Other | Admitting: Anesthesiology

## 2023-03-12 ENCOUNTER — Ambulatory Visit (HOSPITAL_COMMUNITY): Payer: Medicaid Other | Admitting: Physician Assistant

## 2023-03-12 ENCOUNTER — Encounter (HOSPITAL_COMMUNITY)
Admission: RE | Disposition: A | Discharge status: Home / Self Care | Payer: Self-pay | Source: Ambulatory Visit | Attending: Surgery

## 2023-03-12 ENCOUNTER — Telehealth: Payer: Self-pay | Admitting: Surgical

## 2023-03-12 DIAGNOSIS — Z9049 Acquired absence of other specified parts of digestive tract: Secondary | ICD-10-CM | POA: Diagnosis not present

## 2023-03-12 DIAGNOSIS — Z85820 Personal history of malignant melanoma of skin: Secondary | ICD-10-CM | POA: Diagnosis not present

## 2023-03-12 DIAGNOSIS — K449 Diaphragmatic hernia without obstruction or gangrene: Secondary | ICD-10-CM | POA: Diagnosis present

## 2023-03-12 DIAGNOSIS — E785 Hyperlipidemia, unspecified: Secondary | ICD-10-CM | POA: Insufficient documentation

## 2023-03-12 DIAGNOSIS — K219 Gastro-esophageal reflux disease without esophagitis: Secondary | ICD-10-CM | POA: Insufficient documentation

## 2023-03-12 DIAGNOSIS — F419 Anxiety disorder, unspecified: Secondary | ICD-10-CM | POA: Diagnosis not present

## 2023-03-12 DIAGNOSIS — I10 Essential (primary) hypertension: Secondary | ICD-10-CM | POA: Insufficient documentation

## 2023-03-12 DIAGNOSIS — E039 Hypothyroidism, unspecified: Secondary | ICD-10-CM | POA: Insufficient documentation

## 2023-03-12 DIAGNOSIS — Z79899 Other long term (current) drug therapy: Secondary | ICD-10-CM | POA: Insufficient documentation

## 2023-03-12 DIAGNOSIS — M797 Fibromyalgia: Secondary | ICD-10-CM | POA: Diagnosis not present

## 2023-03-12 DIAGNOSIS — F32A Depression, unspecified: Secondary | ICD-10-CM | POA: Insufficient documentation

## 2023-03-12 DIAGNOSIS — Z8719 Personal history of other diseases of the digestive system: Secondary | ICD-10-CM

## 2023-03-12 DIAGNOSIS — Z90710 Acquired absence of both cervix and uterus: Secondary | ICD-10-CM | POA: Insufficient documentation

## 2023-03-12 DIAGNOSIS — M1990 Unspecified osteoarthritis, unspecified site: Secondary | ICD-10-CM | POA: Insufficient documentation

## 2023-03-12 DIAGNOSIS — D509 Iron deficiency anemia, unspecified: Secondary | ICD-10-CM | POA: Insufficient documentation

## 2023-03-12 HISTORY — PX: XI ROBOTIC ASSISTED PARAESOPHAGEAL HERNIA REPAIR: SHX6871

## 2023-03-12 SURGERY — REPAIR, HERNIA, PARAESOPHAGEAL, ROBOT-ASSISTED
Anesthesia: General

## 2023-03-12 MED ORDER — FENTANYL CITRATE (PF) 250 MCG/5ML IJ SOLN
INTRAMUSCULAR | Status: AC
  Filled 2023-03-12: qty 5

## 2023-03-12 MED ORDER — ONDANSETRON HCL 4 MG/2ML IJ SOLN
INTRAMUSCULAR | Status: AC
  Filled 2023-03-12: qty 2

## 2023-03-12 MED ORDER — ONDANSETRON HCL 4 MG/2ML IJ SOLN: 4.0000 mg | Freq: Four times a day (QID) | INTRAMUSCULAR | Status: DC | PRN

## 2023-03-12 MED ORDER — DICYCLOMINE HCL 10 MG PO CAPS
10.0000 mg | ORAL_CAPSULE | Freq: Three times a day (TID) | ORAL | Status: DC
  Administered 2023-03-12 – 2023-03-13 (×2): 10 mg via ORAL
  Filled 2023-03-12 (×2): qty 1

## 2023-03-12 MED ORDER — LIDOCAINE HCL (CARDIAC) PF 100 MG/5ML IV SOSY
PREFILLED_SYRINGE | INTRAVENOUS | Status: DC | PRN
  Administered 2023-03-12: 70 mg via INTRAVENOUS

## 2023-03-12 MED ORDER — HYDRALAZINE HCL 20 MG/ML IJ SOLN: 10.0000 mg | INTRAMUSCULAR | Status: DC | PRN

## 2023-03-12 MED ORDER — KETOROLAC TROMETHAMINE 15 MG/ML IJ SOLN: 15.0000 mg | Freq: Four times a day (QID) | INTRAMUSCULAR | Status: DC | PRN

## 2023-03-12 MED ORDER — GABAPENTIN 300 MG PO CAPS
300.0000 mg | ORAL_CAPSULE | ORAL | Status: AC
  Administered 2023-03-12: 300 mg via ORAL
  Filled 2023-03-12: qty 1

## 2023-03-12 MED ORDER — OXYCODONE HCL 5 MG PO TABS
ORAL_TABLET | ORAL | Status: AC
  Filled 2023-03-12: qty 1

## 2023-03-12 MED ORDER — BUPIVACAINE LIPOSOME 1.3 % IJ SUSP
INTRAMUSCULAR | Status: AC
  Filled 2023-03-12: qty 20

## 2023-03-12 MED ORDER — OXYCODONE HCL 5 MG/5ML PO SOLN: 5.0000 mg | Freq: Once | ORAL | Status: AC | PRN

## 2023-03-12 MED ORDER — EPHEDRINE SULFATE-NACL 50-0.9 MG/10ML-% IV SOSY
PREFILLED_SYRINGE | INTRAVENOUS | Status: DC | PRN
Start: 2023-03-12 — End: 2023-03-12
  Administered 2023-03-12 (×2): 5 mg via INTRAVENOUS
  Administered 2023-03-12 (×2): 2.5 mg via INTRAVENOUS

## 2023-03-12 MED ORDER — ONDANSETRON HCL 4 MG/2ML IJ SOLN
INTRAMUSCULAR | Status: DC | PRN
  Administered 2023-03-12: 4 mg via INTRAVENOUS

## 2023-03-12 MED ORDER — DOCUSATE SODIUM 100 MG PO CAPS
100.0000 mg | ORAL_CAPSULE | Freq: Two times a day (BID) | ORAL | Status: DC
  Administered 2023-03-12 – 2023-03-13 (×3): 100 mg via ORAL
  Filled 2023-03-12 (×3): qty 1

## 2023-03-12 MED ORDER — LISINOPRIL-HYDROCHLOROTHIAZIDE 10-12.5 MG PO TABS: 1.0000 | ORAL_TABLET | Freq: Every day | ORAL | Status: DC

## 2023-03-12 MED ORDER — METOPROLOL TARTRATE 5 MG/5ML IV SOLN: 5.0000 mg | Freq: Four times a day (QID) | INTRAVENOUS | Status: DC | PRN

## 2023-03-12 MED ORDER — ESCITALOPRAM OXALATE 20 MG PO TABS
20.0000 mg | ORAL_TABLET | Freq: Every day | ORAL | Status: DC
  Administered 2023-03-12: 20 mg via ORAL
  Filled 2023-03-12: qty 1

## 2023-03-12 MED ORDER — DEXAMETHASONE SODIUM PHOSPHATE 10 MG/ML IJ SOLN
INTRAMUSCULAR | Status: AC
  Filled 2023-03-12: qty 1

## 2023-03-12 MED ORDER — HYDROMORPHONE HCL 1 MG/ML IJ SOLN
0.2500 mg | INTRAMUSCULAR | Status: DC | PRN
  Administered 2023-03-12: 0.5 mg via INTRAVENOUS
  Administered 2023-03-12: 0.25 mg via INTRAVENOUS

## 2023-03-12 MED ORDER — PANTOPRAZOLE SODIUM 40 MG IV SOLR
40.0000 mg | Freq: Every day | INTRAVENOUS | Status: DC
  Administered 2023-03-12: 40 mg via INTRAVENOUS
  Filled 2023-03-12: qty 10

## 2023-03-12 MED ORDER — CYCLOBENZAPRINE HCL 5 MG PO TABS: 5.0000 mg | ORAL_TABLET | Freq: Three times a day (TID) | ORAL | Status: DC | PRN

## 2023-03-12 MED ORDER — ROCURONIUM BROMIDE 100 MG/10ML IV SOLN
INTRAVENOUS | Status: DC | PRN
  Administered 2023-03-12: 70 mg via INTRAVENOUS

## 2023-03-12 MED ORDER — MIDAZOLAM HCL 2 MG/2ML IJ SOLN
INTRAMUSCULAR | Status: AC
  Filled 2023-03-12: qty 2

## 2023-03-12 MED ORDER — LACTATED RINGERS IV SOLN: INTRAVENOUS | Status: DC | PRN

## 2023-03-12 MED ORDER — DIPHENHYDRAMINE HCL 25 MG PO CAPS: 25.0000 mg | ORAL_CAPSULE | Freq: Four times a day (QID) | ORAL | Status: DC | PRN

## 2023-03-12 MED ORDER — HYDROMORPHONE HCL 1 MG/ML IJ SOLN
INTRAMUSCULAR | Status: AC
  Administered 2023-03-12: 0.25 mg via INTRAVENOUS
  Filled 2023-03-12: qty 2

## 2023-03-12 MED ORDER — LACTATED RINGERS IV SOLN: INTRAVENOUS | Status: DC

## 2023-03-12 MED ORDER — ACETAMINOPHEN 500 MG PO TABS
1000.0000 mg | ORAL_TABLET | ORAL | Status: AC
  Administered 2023-03-12: 1000 mg via ORAL
  Filled 2023-03-12: qty 2

## 2023-03-12 MED ORDER — DROPERIDOL 2.5 MG/ML IJ SOLN: 0.6250 mg | Freq: Once | INTRAMUSCULAR | Status: DC | PRN

## 2023-03-12 MED ORDER — LISINOPRIL 10 MG PO TABS: 10.0000 mg | ORAL_TABLET | Freq: Every day | ORAL | Status: DC

## 2023-03-12 MED ORDER — SODIUM CHLORIDE 0.9 % IV SOLN: INTRAVENOUS | Status: DC

## 2023-03-12 MED ORDER — ACETAMINOPHEN 500 MG PO TABS
1000.0000 mg | ORAL_TABLET | Freq: Four times a day (QID) | ORAL | Status: DC
  Administered 2023-03-12 – 2023-03-13 (×4): 1000 mg via ORAL
  Filled 2023-03-12 (×4): qty 2

## 2023-03-12 MED ORDER — ZOLPIDEM TARTRATE 5 MG PO TABS: 5.0000 mg | ORAL_TABLET | Freq: Every evening | ORAL | Status: DC | PRN

## 2023-03-12 MED ORDER — BUPIVACAINE LIPOSOME 1.3 % IJ SUSP
INTRAMUSCULAR | Status: DC | PRN
  Administered 2023-03-12: 20 mL

## 2023-03-12 MED ORDER — QUETIAPINE FUMARATE 50 MG PO TABS
50.0000 mg | ORAL_TABLET | ORAL | Status: DC
  Administered 2023-03-12: 50 mg via ORAL
  Filled 2023-03-12: qty 1

## 2023-03-12 MED ORDER — SUGAMMADEX SODIUM 200 MG/2ML IV SOLN
INTRAVENOUS | Status: DC | PRN
  Administered 2023-03-12: 200 mg via INTRAVENOUS

## 2023-03-12 MED ORDER — OXYCODONE HCL 5 MG PO TABS: 5.0000 mg | ORAL_TABLET | Freq: Four times a day (QID) | ORAL | Status: DC | PRN

## 2023-03-12 MED ORDER — METHOCARBAMOL 500 MG PO TABS: 500.0000 mg | ORAL_TABLET | Freq: Four times a day (QID) | ORAL | Status: DC | PRN

## 2023-03-12 MED ORDER — PREGABALIN 75 MG PO CAPS
75.0000 mg | ORAL_CAPSULE | Freq: Two times a day (BID) | ORAL | Status: DC
  Administered 2023-03-12 – 2023-03-13 (×2): 75 mg via ORAL
  Filled 2023-03-12 (×2): qty 1

## 2023-03-12 MED ORDER — SIMETHICONE 80 MG PO CHEW: 40.0000 mg | CHEWABLE_TABLET | Freq: Four times a day (QID) | ORAL | Status: DC | PRN

## 2023-03-12 MED ORDER — 0.9 % SODIUM CHLORIDE (POUR BTL) OPTIME
TOPICAL | Status: DC | PRN
Start: 2023-03-12 — End: 2023-03-12
  Administered 2023-03-12: 1000 mL

## 2023-03-12 MED ORDER — EPHEDRINE 5 MG/ML INJ
INTRAVENOUS | Status: AC
  Filled 2023-03-12: qty 5

## 2023-03-12 MED ORDER — CHLORHEXIDINE GLUCONATE 4 % EX SOLN: 60.0000 mL | Freq: Once | CUTANEOUS | Status: DC

## 2023-03-12 MED ORDER — BISACODYL 10 MG RE SUPP: 10.0000 mg | Freq: Every day | RECTAL | Status: DC | PRN

## 2023-03-12 MED ORDER — ROCURONIUM BROMIDE 10 MG/ML (PF) SYRINGE
PREFILLED_SYRINGE | INTRAVENOUS | Status: AC
  Filled 2023-03-12: qty 10

## 2023-03-12 MED ORDER — ENOXAPARIN SODIUM 40 MG/0.4ML IJ SOSY
40.0000 mg | PREFILLED_SYRINGE | INTRAMUSCULAR | Status: DC
  Administered 2023-03-13: 40 mg via SUBCUTANEOUS
  Filled 2023-03-12: qty 0.4

## 2023-03-12 MED ORDER — LIDOCAINE HCL (PF) 2 % IJ SOLN
INTRAMUSCULAR | Status: AC
  Filled 2023-03-12: qty 5

## 2023-03-12 MED ORDER — LEVOTHYROXINE SODIUM 50 MCG PO TABS
50.0000 ug | ORAL_TABLET | Freq: Every day | ORAL | Status: DC
  Administered 2023-03-13: 50 ug via ORAL
  Filled 2023-03-12: qty 1

## 2023-03-12 MED ORDER — CEFAZOLIN SODIUM-DEXTROSE 2-4 GM/100ML-% IV SOLN
2.0000 g | INTRAVENOUS | Status: AC
  Administered 2023-03-12: 2 g via INTRAVENOUS
  Filled 2023-03-12: qty 100

## 2023-03-12 MED ORDER — HYDROMORPHONE HCL 1 MG/ML IJ SOLN: 0.5000 mg | INTRAMUSCULAR | Status: DC | PRN

## 2023-03-12 MED ORDER — DIPHENHYDRAMINE HCL 50 MG/ML IJ SOLN: 25.0000 mg | Freq: Four times a day (QID) | INTRAMUSCULAR | Status: DC | PRN

## 2023-03-12 MED ORDER — BUPIVACAINE-EPINEPHRINE 0.25% -1:200000 IJ SOLN
INTRAMUSCULAR | Status: DC | PRN
  Administered 2023-03-12: 30 mL

## 2023-03-12 MED ORDER — ORAL CARE MOUTH RINSE: 15.0000 mL | Freq: Once | OROMUCOSAL | Status: AC

## 2023-03-12 MED ORDER — PHENYLEPHRINE HCL-NACL 20-0.9 MG/250ML-% IV SOLN
INTRAVENOUS | Status: DC | PRN
  Administered 2023-03-12: 35 ug/min via INTRAVENOUS

## 2023-03-12 MED ORDER — CHLORHEXIDINE GLUCONATE 0.12 % MT SOLN
15.0000 mL | Freq: Once | OROMUCOSAL | Status: AC
  Administered 2023-03-12: 15 mL via OROMUCOSAL

## 2023-03-12 MED ORDER — DEXAMETHASONE SODIUM PHOSPHATE 10 MG/ML IJ SOLN
INTRAMUSCULAR | Status: DC | PRN
Start: 2023-03-12 — End: 2023-03-12
  Administered 2023-03-12: 8 mg via INTRAVENOUS

## 2023-03-12 MED ORDER — PROMETHAZINE HCL 25 MG/ML IJ SOLN: 6.2500 mg | INTRAMUSCULAR | Status: DC | PRN

## 2023-03-12 MED ORDER — ONDANSETRON 4 MG PO TBDP: 4.0000 mg | ORAL_TABLET | Freq: Four times a day (QID) | ORAL | Status: DC | PRN

## 2023-03-12 MED ORDER — BUPIVACAINE LIPOSOME 1.3 % IJ SUSP: 20.0000 mL | Freq: Once | INTRAMUSCULAR | Status: DC

## 2023-03-12 MED ORDER — OXYCODONE HCL 5 MG PO TABS
5.0000 mg | ORAL_TABLET | Freq: Once | ORAL | Status: AC | PRN
  Administered 2023-03-12: 5 mg via ORAL

## 2023-03-12 MED ORDER — MIDAZOLAM HCL 5 MG/5ML IJ SOLN
INTRAMUSCULAR | Status: DC | PRN
  Administered 2023-03-12 (×2): 1 mg via INTRAVENOUS

## 2023-03-12 MED ORDER — LACTATED RINGERS IV SOLN
INTRAVENOUS | Status: AC | PRN
  Administered 2023-03-12: 1000 mL via INTRAVENOUS

## 2023-03-12 MED ORDER — TRAMADOL HCL 50 MG PO TABS: 50.0000 mg | ORAL_TABLET | Freq: Four times a day (QID) | ORAL | Status: DC | PRN

## 2023-03-12 MED ORDER — BUSPIRONE HCL 10 MG PO TABS: 10.0000 mg | ORAL_TABLET | Freq: Every day | ORAL | Status: DC | PRN

## 2023-03-12 MED ORDER — FENTANYL CITRATE (PF) 250 MCG/5ML IJ SOLN
INTRAMUSCULAR | Status: DC | PRN
Start: 2023-03-12 — End: 2023-03-12
  Administered 2023-03-12: 100 ug via INTRAVENOUS
  Administered 2023-03-12: 50 ug via INTRAVENOUS

## 2023-03-12 MED ORDER — PROPOFOL 10 MG/ML IV BOLUS
INTRAVENOUS | Status: AC
  Filled 2023-03-12: qty 20

## 2023-03-12 MED ORDER — PROPOFOL 10 MG/ML IV BOLUS
INTRAVENOUS | Status: DC | PRN
  Administered 2023-03-12: 30 mg via INTRAVENOUS
  Administered 2023-03-12: 120 mg via INTRAVENOUS

## 2023-03-12 MED ORDER — HYDROCHLOROTHIAZIDE 12.5 MG PO TABS: 12.5000 mg | ORAL_TABLET | Freq: Every day | ORAL | Status: DC

## 2023-03-12 MED ORDER — MEPERIDINE HCL 50 MG/ML IJ SOLN: 6.2500 mg | INTRAMUSCULAR | Status: DC | PRN

## 2023-03-12 MED ORDER — METOCLOPRAMIDE HCL 5 MG/ML IJ SOLN
10.0000 mg | Freq: Four times a day (QID) | INTRAMUSCULAR | Status: DC
  Administered 2023-03-12 – 2023-03-13 (×3): 10 mg via INTRAVENOUS
  Filled 2023-03-12 (×3): qty 2

## 2023-03-12 MED ORDER — BUPIVACAINE-EPINEPHRINE 0.25% -1:200000 IJ SOLN
INTRAMUSCULAR | Status: AC
  Filled 2023-03-12: qty 1

## 2023-03-12 SURGICAL SUPPLY — 69 items
ADH SKN CLS APL DERMABOND .7 (GAUZE/BANDAGES/DRESSINGS) ×1
ANTIFOG SOL W/FOAM PAD STRL (MISCELLANEOUS) ×1
APL PRP STRL LF DISP 70% ISPRP (MISCELLANEOUS) ×1
APPLIER CLIP 5 13 M/L LIGAMAX5 (MISCELLANEOUS)
APPLIER CLIP ROT 10 11.4 M/L (STAPLE)
APR CLP MED LRG 11.4X10 (STAPLE)
APR CLP MED LRG 5 ANG JAW (MISCELLANEOUS)
BAG COUNTER SPONGE SURGICOUNT (BAG) ×2 IMPLANT
BAG SPNG CNTER NS LX DISP (BAG) ×1
BLADE SURG SZ11 CARB STEEL (BLADE) ×2 IMPLANT
CHLORAPREP W/TINT 26 (MISCELLANEOUS) ×2 IMPLANT
CLIP APPLIE 5 13 M/L LIGAMAX5 (MISCELLANEOUS) IMPLANT
CLIP APPLIE ROT 10 11.4 M/L (STAPLE) IMPLANT
COVER SURGICAL LIGHT HANDLE (MISCELLANEOUS) ×2 IMPLANT
COVER TIP SHEARS 8 DVNC (MISCELLANEOUS) IMPLANT
DERMABOND ADVANCED .7 DNX12 (GAUZE/BANDAGES/DRESSINGS) ×2 IMPLANT
DRAIN PENROSE 0.5X18 (DRAIN) IMPLANT
DRAPE ARM DVNC X/XI (DISPOSABLE) ×8 IMPLANT
DRAPE COLUMN DVNC XI (DISPOSABLE) ×2 IMPLANT
DRIVER NDL LRG 8 DVNC XI (INSTRUMENTS) ×4 IMPLANT
DRIVER NDL MEGA SUTCUT DVNCXI (INSTRUMENTS) ×2 IMPLANT
DRIVER NDLE LRG 8 DVNC XI (INSTRUMENTS) ×2
DRIVER NDLE MEGA SUTCUT DVNCXI (INSTRUMENTS) ×1
ELECT REM PT RETURN 15FT ADLT (MISCELLANEOUS) ×2 IMPLANT
ENDOLOOP SUT PDS II 0 18 (SUTURE) IMPLANT
GAUZE 4X4 16PLY ~~LOC~~+RFID DBL (SPONGE) ×2 IMPLANT
GLOVE BIO SURGEON STRL SZ 6 (GLOVE) ×6 IMPLANT
GLOVE INDICATOR 6.5 STRL GRN (GLOVE) ×6 IMPLANT
GLOVE SS BIOGEL STRL SZ 6 (GLOVE) ×2 IMPLANT
GOWN STRL REUS W/ TWL LRG LVL3 (GOWN DISPOSABLE) ×4 IMPLANT
GOWN STRL REUS W/ TWL XL LVL3 (GOWN DISPOSABLE) IMPLANT
GOWN STRL REUS W/TWL LRG LVL3 (GOWN DISPOSABLE) ×2
GOWN STRL REUS W/TWL XL LVL3 (GOWN DISPOSABLE)
GRASPER TIP-UP FEN DVNC XI (INSTRUMENTS) ×2 IMPLANT
IRRIG SUCT STRYKERFLOW 2 WTIP (MISCELLANEOUS) ×1
IRRIGATION SUCT STRKRFLW 2 WTP (MISCELLANEOUS) ×2 IMPLANT
KIT BASIN OR (CUSTOM PROCEDURE TRAY) ×2 IMPLANT
KIT TURNOVER KIT A (KITS) IMPLANT
LUBRICANT JELLY K Y 4OZ (MISCELLANEOUS) IMPLANT
MARKER SKIN DUAL TIP RULER LAB (MISCELLANEOUS) ×2 IMPLANT
NDL HYPO 22X1.5 SAFETY MO (MISCELLANEOUS) ×2 IMPLANT
NDL INSUFFLATION 14GA 120MM (NEEDLE) ×2 IMPLANT
NEEDLE HYPO 22X1.5 SAFETY MO (MISCELLANEOUS) ×1
NEEDLE INSUFFLATION 14GA 120MM (NEEDLE) ×1
PACK CARDIOVASCULAR III (CUSTOM PROCEDURE TRAY) ×2 IMPLANT
PAD POSITIONING PINK XL (MISCELLANEOUS) ×2 IMPLANT
RETRACTOR GRSP SML 8 DVNC XI (INSTRUMENTS) ×2 IMPLANT
SCISSORS LAP 5X35 DISP (ENDOMECHANICALS) IMPLANT
SCISSORS MNPLR CVD DVNC XI (INSTRUMENTS) ×2 IMPLANT
SEAL UNIV 5-12 XI (MISCELLANEOUS) ×6 IMPLANT
SEALER VESSEL EXT DVNC XI (MISCELLANEOUS) ×2 IMPLANT
SOL ELECTROSURG ANTI STICK (MISCELLANEOUS) ×1
SOLUTION ANTFG W/FOAM PAD STRL (MISCELLANEOUS) ×2 IMPLANT
SOLUTION ELECTROSURG ANTI STCK (MISCELLANEOUS) ×2 IMPLANT
SPIKE FLUID TRANSFER (MISCELLANEOUS) ×2 IMPLANT
SUT ETHIBOND 0 36 GRN (SUTURE) ×4 IMPLANT
SUT MNCRL AB 4-0 PS2 18 (SUTURE) ×2 IMPLANT
SUT SILK 0 SH 30 (SUTURE) IMPLANT
SUT SILK 2 0 SH (SUTURE) IMPLANT
SYR 20ML ECCENTRIC (SYRINGE) IMPLANT
SYR 20ML LL LF (SYRINGE) ×2 IMPLANT
TIP INNERVISION DETACH 40FR (MISCELLANEOUS) IMPLANT
TIP INNERVISION DETACH 50FR (MISCELLANEOUS) IMPLANT
TIP INNERVISION DETACH 56FR (MISCELLANEOUS) IMPLANT
TOWEL OR 17X26 10 PK STRL BLUE (TOWEL DISPOSABLE) ×2 IMPLANT
TOWEL OR NON WOVEN STRL DISP B (DISPOSABLE) ×2 IMPLANT
TRAY FOLEY MTR SLVR 16FR STAT (SET/KITS/TRAYS/PACK) IMPLANT
TROCAR ADV FIXATION 5X100MM (TROCAR) ×2 IMPLANT
TUBING INSUFFLATION 10FT LAP (TUBING) ×2 IMPLANT

## 2023-03-12 NOTE — Anesthesia Procedure Notes (Addendum)
Procedure Name: Intubation Date/Time: 03/12/2023 9:15 AM  Performed by: Maurene Capes, CRNAPre-anesthesia Checklist: Patient identified, Emergency Drugs available, Suction available and Patient being monitored Patient Re-evaluated:Patient Re-evaluated prior to induction Oxygen Delivery Method: Circle System Utilized Preoxygenation: Pre-oxygenation with 100% oxygen Induction Type: IV induction Ventilation: Mask ventilation without difficulty Laryngoscope Size: Mac and 4 Grade View: Grade II Tube type: Oral Tube size: 7.0 mm Number of attempts: 1 Placement Confirmation: ETT inserted through vocal cords under direct vision, positive ETCO2 and breath sounds checked- equal and bilateral Secured at: 21 cm Tube secured with: Tape Dental Injury: Teeth and Oropharynx as per pre-operative assessment

## 2023-03-12 NOTE — Transfer of Care (Signed)
Immediate Anesthesia Transfer of Care Note  Patient: Denise Morales  Procedure(s) Performed: XI ROBOTIC ASSISTED PARAESOPHAGEAL HERNIA REPAIR WITH FUNDOPLICATION  Patient Location: PACU  Anesthesia Type:General  Level of Consciousness: drowsy and patient cooperative  Airway & Oxygen Therapy: Patient Spontanous Breathing and Patient connected to face mask oxygen  Post-op Assessment: Report given to RN and Post -op Vital signs reviewed and stable  Post vital signs: Reviewed and stable  Last Vitals:  Vitals Value Taken Time  BP    Temp    Pulse    Resp    SpO2      Last Pain:  Vitals:   03/12/23 0657  TempSrc:   PainSc: 0-No pain      Patients Stated Pain Goal: 6 (03/12/23 0657)  Complications: No notable events documented.

## 2023-03-12 NOTE — Telephone Encounter (Signed)
Called Pt to reschedule postop appt on 11/04. Pt didn't answer and no voicemail to leave a message.

## 2023-03-12 NOTE — Discharge Instructions (Signed)
POST OP INSTRUCTIONS   EAT **See Diet below**  WALK Walk an hour a day (cumulative- not all at once).  Control your pain to do that.    CONTROL PAIN Control pain so that you can walk, sleep, tolerate sneezing/coughing, go up/down stairs.  HAVE A BOWEL MOVEMENT DAILY Keep your bowels regular to avoid problems.  OK to try a laxative to override constipation.  OK to use an antidiarrheal to slow down diarrhea.  Call if not better after 2 tries  CALL IF YOU HAVE PROBLEMS/CONCERNS Call if you are still struggling despite following these instructions. Call if you have concerns not answered by these instructions  Take your usually prescribed home medications unless otherwise directed.  PAIN CONTROL: Pain is best controlled by a usual combination of three different methods TOGETHER: Ice/Heat Over the counter pain medication Prescription pain medication Most patients will experience some swelling and bruising around the incisions.  Ice packs or heating pads (30-60 minutes up to 6 times a day) will help. Use ice for the first few days to help decrease swelling and bruising, then switch to heat to help relax tight/sore spots and speed recovery.  Some people prefer to use ice alone, heat alone, alternating between ice & heat.  Experiment to what works for you.  Swelling and bruising can take several weeks to resolve.   It is helpful to take an over-the-counter pain medication regularly for the first few days: Naproxen (Aleve, etc)  Two 220mg  tabs twice a day OR Ibuprofen (Advil, etc) Three 200mg  tabs four times a day (every meal & bedtime) AND Acetaminophen (Tylenol, etc) 500-650mg  four times a day (every meal & bedtime) A  prescription for pain medication (such as oxycodone, hydrocodone, tramadol, gabapentin, methocarbamol, etc) should be given to you upon discharge.  Take your pain medication as prescribed, IF NEEDED.  If you are having problems/concerns with the prescription medicine (does not  control pain, nausea, vomiting, rash, itching, etc), please call us [phone] to see if we need to switch you to a different pain medicine that will work better for you and/or control your side effect better. If you need a refill on your pain medication, please give [place] 48 hour notice.  contact your pharmacy.  They will contact our office to request authorization. Prescriptions will not be filled after 5 pm or on week-ends  Avoid getting constipated.   Between the surgery and the pain medications, it is common to experience some constipation.   Increasing fluid intake and taking a fiber supplement (such as Metamucil, Citrucel, FiberCon, MiraLax, etc) 1-2 times a day regularly will usually help prevent this problem from occurring.   A mild laxative (prune juice, Milk of Magnesia, MiraLax, etc) should be taken according to package directions if there are no bowel movements after 48 hours.   Watch out for diarrhea.   If you have many loose bowel movements, simplify your diet to bland foods & liquids for a few days.   Stop any stool softeners and decrease your fiber supplement.   Switching to mild anti-diarrheal medications (Kayopectate, Pepto Bismol) can help.   If this worsens or does not improve, please call us.  Wash / shower every day.  You may shower over the skin glue which is waterproof.  Do not soak or submerge incisions.  No rubbing, scrubbing, lotions or ointments to incisions.  Glue will flake off after about 2 weeks.  You may leave the incision open to air.  You may replace  a dressing/Band-Aid to cover the incision for comfort if you wish.   ACTIVITIES as tolerated:   You may resume regular (light) daily activities beginning the next day--such as daily self-care, walking, climbing stairs--gradually increasing activities as tolerated.  If you can walk 30 minutes without difficulty, it is safe to try more intense activity such as jogging, treadmill, bicycling, low-impact aerobics,  swimming, etc. Refrain from the most intensive and strenuous activity such as sit-ups, heavy lifting (>20lb), contact sports, etc. Avoid pushing, straining bearing down, retching/gagging, and forceful coughing as much as possible.   Refrain from any heavy lifting or straining until at least 6 weeks post-op.   DO NOT PUSH THROUGH PAIN.  Let pain be your guide: If it hurts to do something, don't do it.  Pain is your body warning you to avoid that activity for another week until the pain goes down. You may drive when you are no longer taking prescription pain medication, you can comfortably wear a seatbelt, and you can safely maneuver your car and apply brakes. You may have sexual intercourse when it is comfortable.  FOLLOW UP in our office Please call [facility] at [phone] to set up an appointment to see your surgeon in the office for a follow-up appointment approximately 2-3 weeks after your surgery. Make sure that you call for this appointment the day you arrive home to insure a convenient appointment time.  10. IF YOU HAVE DISABILITY OR FAMILY LEAVE FORMS, BRING THEM TO THE OFFICE FOR PROCESSING.  DO NOT GIVE THEM TO YOUR DOCTOR.   WHEN TO CALL us [phone]: Poor pain control Reactions / problems with new medications (rash/itching, nausea, etc)  Fever over 101.5 F (38.5 C) Inability to urinate Nausea and/or vomiting Worsening swelling or bruising Continued bleeding from incision. Increased pain, redness, or drainage from the incision   The clinic staff is available to answer your questions during regular business hours (8:30am-5pm).  Please don't hesitate to call and ask to speak to one of our nurses for clinical concerns.   If you have a medical emergency, go to the nearest emergency room or call 911.  A surgeon from [facility] is always on call at the [facility], [place] [address], [place], [place]  [address] ? MAIN: [phone]  [phone] ? TOLL FREE: [phone] ?  FAX [phone] [email or web address]   EATING AFTER YOUR PARAESOPHAGEAL HERNIA REPAIR SURGERY  After your esophageal surgery, expect some sticking with swallowing over the next 1-2 months.    If food sticks when you eat, it is called "dysphagia".  This is due to swelling around your esophagus at the wrap & hiatal diaphragm repair.  It will gradually ease off over the next few months.  To help you through this temporary phase, we start you out on a pureed (blenderized) diet.  Your first meal in the hospital was thin liquids.  You should have been given a pureed diet by the time you left the hospital.  We ask patients to stay on a pureed diet for the first 2-3 weeks to avoid anything getting "stuck" near your recent surgery.  Don't be alarmed if your ability to swallow doesn't progress according to this plan.  Everyone is different and some diets can advance more or less quickly.    It is often helpful to crush your medications or split them as they can sometimes stick, especially the first week or so.   Some BASIC  RULES to follow are: Maintain an upright position whenever eating or drinking. Take small bites - just a teaspoon size bite at a time. Eat slowly.  It may also help to eat only one food at a time. Consider nibbling through smaller, more frequent meals & avoid the urge to eat BIG meals Do not push through feelings of fullness, nausea, or bloatedness Do not mix solid foods and liquids in the same mouthful Try not to "wash foods down" with large gulps of liquids. Avoid carbonated (bubbly/fizzy) drinks.   Avoid foods that make you feel gassy or bloated.  Start with bland foods first.  Wait on trying greasy, fried, or spicy meals until you are tolerating more bland solids well. Understand that it will be hard to burp and belch at first.  This gradually improves with time.  Expect to be more gassy/flatulent/bloated initially.  Walking  will help your body manage it better. Consider using medications for bloating that contain simethicone such as  Maalox or Gas-X  Consider crushing her medications, especially smaller pills.  The ability to swallow pills should get easier after a few weeks Eat in a relaxed atmosphere & minimize distractions. Avoid talking while eating.   Do not use straws. Following each meal, sit in an upright position (90 degree angle) for 60 to 90 minutes.  Going for a short walk can help as well If food does stick, don't panic.  Try to relax and let the food pass on its own.  Sipping WARM LIQUID such as strong hot black tea can also help slide it down.   Be gradual in changes & use common sense:  -If you easily tolerating a certain "level" of foods, advance to the next level gradually -If you are having trouble swallowing a particular food, then avoid it.   -If food is sticking when you advance your diet, go back to thinner previous diet (the lower LEVEL) for 1-2 days.  LEVEL 1 = PUREED DIET  Do for the first 2 WEEKS AFTER SURGERY  -Foods in this group are pureed or blenderized to a smooth, mashed potato-like consistency.  -If necessary, the pureed foods can keep their shape with the addition of a thickening agent.   -Meat should be pureed to a smooth, pasty consistency.  Hot broth or gravy may be added to the pureed meat, approximately 1 oz. of liquid per 3 oz. serving of meat. -CAUTION:  If any foods do not puree into a smooth consistency, swallowing will be more difficult.  (For example, nuts or seeds sometimes do not blend well.)  Hot Foods Cold Foods  Pureed scrambled eggs and cheese Pureed cottage cheese  Baby cereals Thickened juices and nectars  Thinned cooked cereals (no lumps) Thickened milk or eggnog  Pureed [place] toast or pancakes Ensure  Mashed potatoes Ice cream  Pureed parsley, au gratin, scalloped potatoes, candied sweet potatoes Fruit or [place] ice, sherbet  Pureed buttered or  alfredo noodles Plain yogurt  Pureed vegetables (no corn or peas) Instant breakfast  Pureed soups and creamed soups Smooth pudding, mousse, custard  Pureed scalloped apples Whipped gelatin  Gravies Sugar, syrup, honey, jelly  Sauces, cheese, tomato, barbecue, white, creamed Cream  Any baby food Creamer  Alcohol in moderation (not beer or champagne) Margarine  Coffee or tea Mayonnaise   Ketchup, mustard   Apple sauce   SAMPLE MENU:  PUREED DIET Breakfast Lunch Dinner  Orange juice, 1/2 cup Cream of wheat, 1/2 cup Pineapple juice, 1/2 cup Pureed  [place], barley soup, 3/4 cup Pureed Hawaiian chicken, 3 oz  Scrambled eggs, mashed or blended with cheese, 1/2 cup Tea or coffee, 1 cup  Whole milk, 1 cup  Non-dairy creamer, 2 Tbsp. Mashed potatoes, 1/2 cup Pureed cooled broccoli, 1/2 cup Apple sauce, 1/2 cup Coffee or tea Mashed potatoes, 1/2 cup Pureed spinach, 1/2 cup Frozen yogurt, 1/2 cup Tea or coffee      LEVEL 2 = SOFT DIET  After your first 2 weeks, you can advance to a soft diet.   Keep on this diet until everything goes down easily.  Hot Foods Cold Foods  White fish Cottage cheese  Stuffed fish Junior baby fruit  Baby food meals Semi thickened juices  Minced soft cooked, scrambled, poached eggs nectars  Souffle & omelets Ripe mashed bananas  Cooked cereals Canned fruit, pineapple sauce, milk  potatoes Milkshake  Buttered or Alfredo noodles Custard  Cooked cooled vegetable Puddings, including tapioca  Sherbet Yogurt  Vegetable soup or alphabet soup Fruit ice, [place] ice  Gravies Whipped gelatin  Sugar, syrup, honey, jelly Junior baby desserts  Sauces:  Cheese, creamed, barbecue, tomato, white Cream  Coffee or tea Margarine   SAMPLE MENU:  LEVEL 2 Breakfast Lunch Dinner  Orange juice, 1/2 cup Oatmeal, 1/2 cup Scrambled eggs with cheese, 1/2 cup Decaffeinated tea, 1 cup Whole milk, 1 cup Non-dairy creamer, 2 Tbsp Pineapple juice, 1/2 cup Minced beef, 3  oz Gravy, 2 Tbsp Mashed potatoes, 1/2 cup Minced fresh broccoli, 1/2 cup Applesauce, 1/2 cup Coffee, 1 cup [place], barley soup, 3/4 cup Minced Hawaiian chicken, 3 oz Mashed potatoes, 1/2 cup Cooked spinach, 1/2 cup Frozen yogurt, 1/2 cup Non-dairy creamer, 2 Tbsp      LEVEL 3 = CHOPPED DIET  -After all the foods in level 2 (soft diet) are passing through well you should advance up to more chopped foods.  -It is still important to cut these foods into small pieces and eat slowly.  Hot Foods Cold Foods  Poultry Cottage cheese  Chopped Swedish meatballs Yogurt  Meat salads (ground or flaked meat) Milk  Flaked fish (tuna) Milkshakes  Poached or scrambled eggs Soft, cold, dry cereal  Souffles and omelets Fruit juices or nectars  Cooked cereals Chopped canned fruit  Chopped [place] toast or pancakes Canned fruit cocktail  Noodles or pasta (no rice) Pudding, mousse, custard  Cooked vegetables (no frozen peas, corn, or mixed vegetables) Green salad  Canned small sweet peas Ice cream  Creamed soup or vegetable soup Fruit ice, [place] ice  Pureed vegetable soup or alphabet soup Non-dairy creamer  Ground scalloped apples Margarine  Gravies Mayonnaise  Sauces:  Cheese, creamed, barbecue, tomato, white Ketchup  Coffee or tea Mustard   SAMPLE MENU:  LEVEL 3 Breakfast Lunch Dinner  Orange juice, 1/2 cup Oatmeal, 1/2 cup Scrambled eggs with cheese, 1/2 cup Decaffeinated tea, 1 cup Whole milk, 1 cup Non-dairy creamer, 2 Tbsp Ketchup, 1 Tbsp Margarine, 1 tsp Salt, 1/4 tsp Sugar, 2 tsp Pineapple juice, 1/2 cup Ground beef, 3 oz Gravy, 2 Tbsp Mashed potatoes, 1/2 cup Cooked spinach, 1/2 cup Applesauce, 1/2 cup Decaffeinated coffee Whole milk Non-dairy creamer, 2 Tbsp Margarine, 1 tsp Salt, 1/4 tsp Pureed [place], barley soup, 3/4 cup Barbecue chicken, 3 oz Mashed potatoes, 1/2 cup Ground fresh broccoli, 1/2 cup Frozen yogurt, 1/2 cup Decaffeinated tea, 1 cup Non-dairy  creamer, 2 Tbsp Margarine, 1 tsp Salt, 1/4 tsp Sugar, 1 tsp    LEVEL 4:  REGULAR FOODS  -Foods  in this group are soft, moist, regularly textured foods.   -This level includes meat and breads, which tend to be the hardest things to swallow.   -Eat very slowly, chew well and continue to avoid carbonated drinks. -most people are at this level in 4-6 weeks  Hot Foods Cold Foods  Baked fish or skinned Soft cheeses - cottage cheese  Souffles and omelets Cream cheese  Eggs Yogurt  Stuffed shells Milk  Spaghetti with meat sauce Milkshakes  Cooked cereal Cold dry cereals (no nuts, dried fruit, coconut)  [place] toast or pancakes Crackers  Buttered toast Fruit juices or nectars  Noodles or pasta (no rice) Canned fruit  Potatoes (all types) Ripe bananas  Soft, cooked vegetables (no corn, lima, or baked beans) Peeled, ripe, fresh fruit  Creamed soups or vegetable soup Cakes (no nuts, dried fruit, coconut)  Canned chicken noodle soup Plain doughnuts  Gravies Ice cream  Bacon dressing Pudding, mousse, custard  Sauces:  Cheese, creamed, barbecue, tomato, white Fruit ice, [place] ice, sherbet  Decaffeinated tea or coffee Whipped gelatin  Pork chops Regular gelatin   Canned fruited gelatin molds   Sugar, syrup, honey, jam, jelly   Cream   Non-dairy   Margarine   Oil   Mayonnaise   Ketchup   Mustard   TROUBLESHOOTING IRREGULAR BOWELS  1) Avoid extremes of bowel movements (no bad constipation/diarrhea)  2) Miralax 17gm mixed in 8oz. water or juice-daily. May use BID as needed.  3) Gas-x,Phazyme, etc. as needed for gas & bloating.  4) Soft,bland diet. No spicy,greasy,fried foods.  5) Prilosec over-the-counter as needed  6) May hold gluten/wheat products from diet to see if symptoms improve.  7) May try probiotics (Align, Activa, etc) to help calm the bowels down  7) If symptoms become worse call back immediately.    If you have any questions please call our office at [facility]  SURGERY: [phone].

## 2023-03-12 NOTE — Anesthesia Postprocedure Evaluation (Signed)
Anesthesia Post Note  Patient: Denise Morales  Procedure(s) Performed: XI ROBOTIC ASSISTED PARAESOPHAGEAL HERNIA REPAIR WITH FUNDOPLICATION     Patient location during evaluation: PACU Anesthesia Type: General Level of consciousness: awake and alert Pain management: pain level controlled Vital Signs Assessment: post-procedure vital signs reviewed and stable Respiratory status: spontaneous breathing Cardiovascular status: stable Anesthetic complications: no   No notable events documented.  Last Vitals:  Vitals:   03/12/23 1301 03/12/23 1429  BP: 130/84 124/88  Pulse: 82 81  Resp: 18 19  Temp: (!) 36.4 C (!) 36.4 C  SpO2: 96% 97%    Last Pain:  Vitals:   03/12/23 1429  TempSrc: Oral  PainSc:                  Lewie Loron

## 2023-03-12 NOTE — H&P (Signed)
Denise Morales [id number]   Referring Provider:  Elease Etienne*   Subjective   Chief Complaint: New Consultation (Paraesophageal hernia)     History of Present Illness:    62 year old woman with history of anemia, anxiety, arthritis, depression, fibromyalgia, GERD and hiatal hernia, hyperlipidemia, hypertension, hypothyroidism, melanoma and previous abdominal surgeries including total abdominal hysterectomy, open cholecystectomy, who presents for evaluation of a paraesophageal hernia. She has a history of iron deficiency anemia for which she underwent upper and lower endoscopy by Dr. Tomasa Rand earlier this month.  EGD noted a benign-appearing intrinsic mild stenosis at the GE junction which was traversed and dilated up to 20 mm; a 6 cm paraesophageal hernia with the GE junction measuring at 29 cm and the hiatus narrowing measured at 35 cm from the incisors. Colonoscopy findings included diverticulosis, polypoid mucosa at the appendiceal orifice.  Path was all benign, negative for H. pylori or any dysplasia.  She states that the hiatal hernia has been known for quite some time, but was noted to have increased in size on this most recent study.  Her primary symptom is indigestion and heartburn.  Has noted some pill dysphagia but no solid food dysphagia, and has noted nocturnal regurgitation with severe burning in the thoracic esophageal region as well as subsequent intermittent raspy cough.  She has been started on antireflux medications and has noted some improvement in this but is still having some breakthrough symptoms.   She also notes lower abdominal pain and a "bubbling" sensation.   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    Review of Systems: A complete review of systems was obtained from the patient.  I have reviewed this information and discussed as appropriate  with the patient.  See HPI as well for other ROS.   Medical History: Past Medical History:  Diagnosis Date   Anemia    Anxiety    Arthritis    GERD (gastroesophageal reflux disease)    History of cancer    Hyperlipidemia    Hypertension     There is no problem list on file for this patient.   Past Surgical History:  Procedure Laterality Date   knee replacement surgery     rotator cuff surgery     shoulder surgery       No Known Allergies  Current Outpatient Medications on File Prior to Visit  Medication Sig Dispense Refill   acetaminophen (TYLENOL) 500 MG tablet Take by mouth     celecoxib (CELEBREX) 200 MG capsule Take 1 capsule by mouth 2 (two) times daily as needed     cholecalciferol, vitD3,/vit K2 (VITAMIN D3-VITAMIN K2 ORAL) Take by mouth     dicyclomine (BENTYL) 10 mg capsule TAKE 1 CAPSULE BY MOUTH 4 TIMES DAILY AS NEEDED     escitalopram oxalate (LEXAPRO) 20 MG tablet Take 1 tablet by mouth once daily     famotidine (PEPCID) 40 MG tablet Take 40 mg by mouth at bedtime     ferrous sulfate 325 (65 FE) MG tablet Take 1 tablet by mouth once daily     krill oil 500 mg Cap Take 500 mg by mouth once daily     levothyroxine (SYNTHROID) 50 MCG tablet Take 50 mcg by mouth every morning before breakfast (0630)     lisinopriL-hydroCHLOROthiazide (ZESTORETIC) 10-12.5 mg tablet Take 1 tablet by mouth once daily  pantoprazole (PROTONIX) 40 MG DR tablet Take 40 mg by mouth 2 (two) times daily before meals     pravastatin (PRAVACHOL) 40 MG tablet Take 1 tablet by mouth once daily     pregabalin (LYRICA) 75 MG capsule Take 75 mg by mouth 2 (two) times daily     QUEtiapine (SEROQUEL) 50 MG tablet Take 1 tablet by mouth at bedtime     TURMERIC ORAL Take 1,000 mg by mouth once daily     zolpidem (AMBIEN) 5 MG tablet Take 1 tablet by mouth once daily     No current facility-administered medications on file prior to visit.    Family History  Problem Relation Age of Onset    Stroke Mother    High blood pressure (Hypertension) Mother    Hyperlipidemia (Elevated cholesterol) Mother    Skin cancer Father    Coronary Artery Disease (Blocked arteries around heart) Father    Diabetes Father    Obesity Sister    High blood pressure (Hypertension) Sister    Diabetes Sister      Social History   Tobacco Use  Smoking Status Never  Smokeless Tobacco Never     Social History   Socioeconomic History   Marital status: Married  Tobacco Use   Smoking status: Never   Smokeless tobacco: Never  Vaping Use   Vaping status: Never Used  Substance and Sexual Activity   Alcohol use: Not Currently   Drug use: Never   Social Determinants of Health    Received from [employer]   Social Network    Objective:    Vitals:   01/24/23 1457  BP: 122/80  Pulse: 52  Temp: 36.7 C (98 F)  SpO2: 97%  Weight: 78.4 kg (172 lb 12.8 oz)  Height: 147.3 cm (4\' 10" )  PainSc: 0-No pain    Body mass index is 36.12 kg/m.  Gen: A&Ox3, no distress  Unlabored respirations Abdomen soft/nontender/nondistended.   Assessment and Plan:  Diagnoses and all orders for this visit:  Paraesophageal hernia    We discussed the relevant anatomy using a diagram to demonstrate and went over options for treatment.  She is already on proton pump inhibitors and at least at 1 point was on H2 blockers, and while this has afforded some good symptomatic relief she is still having some breakthrough symptoms.  Discussed that nonoperative management is acceptable in patients who symptoms are well-controlled with medication or who are asymptomatic however a large percentage of these patients do progress in terms of size of the hernia and there is a small risk of gastric outlet obstruction or volvulus with this.  We also discussed robotic repair with fundoplication and went over the technique in detail.  We discussed risks of bleeding, infection, pain, scarring, injury to intra-abdominal or  mediastinal structures, failure to completely resolve reflux symptoms, recurrence of hiatal hernia which happens in at least 15% but likely more patients, dehiscence or slip of the fundoplication, chronic dysphagia, gas bloat or poor gastric emptying which may be permanent, potential need for gastropexy and gastrostomy tube if there is significant esophageal foreshortening that prohibits safe fundoplication, as well as risks of cardiovascular/ pulmonary/ thromboembolic complications.  We discussed the postoperative recovery timeline and briefly went over dietary changes at least in the early postoperative period.  I did advise her that this is unlikely to have any effect on her lower abdominal pain/symptoms.  Questions were welcomed and answered to her satisfaction.  She would like to proceed with  surgery.  Akshita Italiano Carlye Grippe, MD

## 2023-03-12 NOTE — Op Note (Signed)
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  The abdomen was prepped, draped in the usual sterile fashion and peritoneal access gained using a left subcostal Veress needle followed by uneventful insufflation to 15 mmHg.  A periumbilical 8 mm robotic trocar was inserted followed by the camera and the abdominal cavity was inspected and confirmed to be free of injury from the entry.  Patient was then placed in steep reverse Trendelenburg position.  Bilateral laparoscopic assisted taps blocks were performed with Exparel mixed with 0.25% Marcaine with epinephrine for postoperative pain control.  Additional trocars were placed under direct visualization.  Omental adhesions to the right upper quadrant from her prior open cholecystectomy were sharply lysed before placing the right sided trocars.  A liver retractor was inserted through a subxiphoid incision for fixed retraction of the left lobe.  At this point the robot was docked and instruments were inserted under direct visualization.   A combination of blunt dissection and vessel sealer were used to free the hernia sac from the right and left crus, proceeding anteriorly and then posteriorly.  The short gastric vessels were divided with the vessel sealer.  At this point we had circumferentially freed the hernia  sac and reduced this from the mediastinum.  The hernia sac was excised and discarded.  A Penrose was placed around the distal esophagus to aid with retraction.  Mediastinal attachments of the esophagus were then carefully divided with a combination of blunt dissection and vessel sealer, carefully protecting the esophagus and the vagus nerve throughout the dissection.  We were able to achieve 5 cm of intra-abdominal esophageal length.  The crura were reapproximated posteriorly with simple interrupted 0 Ethibonds; a total of 4 were placed. This narrowed the hiatus to about 2.5 cm, just sufficient for the unstretched esophagus.  The fundus was then brought posteriorly around the distal esophagus and the shoeshine maneuver performed confirming appropriate orientation.  A posterior 270 degree fundoplication was completed with 3 simple interrupted 0 Ethibonds on each side.  On completion the wrap rests without tension in the abdominal cavity, is oriented anteriorly without any tension, twisting or torsion on the distal esophagus.  The Penrose and all sutures were removed.  The abdominal cavity was inspected and hemostasis was ensured.  The liver retractor and robotic instruments were removed under direct visualization.  The abdomen was desufflated and the trocars removed.  Incisions were closed with subcuticular 4-0 Monocryl and Dermabond.  The patient was then awakened, extubated and taken to PACU in stable condition.    All counts were correct at the completion of the case.

## 2023-03-13 ENCOUNTER — Ambulatory Visit (HOSPITAL_COMMUNITY): Payer: Medicaid Other

## 2023-03-13 ENCOUNTER — Encounter (HOSPITAL_COMMUNITY): Payer: Self-pay | Admitting: Surgery

## 2023-03-13 ENCOUNTER — Other Ambulatory Visit (HOSPITAL_COMMUNITY): Payer: Self-pay

## 2023-03-13 DIAGNOSIS — K449 Diaphragmatic hernia without obstruction or gangrene: Secondary | ICD-10-CM | POA: Diagnosis not present

## 2023-03-13 LAB — BASIC METABOLIC PANEL
Anion gap: 9 (ref 5–15)
BUN: 16 mg/dL (ref 8–23)
CO2: 24 mmol/L (ref 22–32)
Calcium: 8.5 mg/dL — ABNORMAL LOW (ref 8.9–10.3)
Chloride: 105 mmol/L (ref 98–111)
Creatinine, Ser: 0.93 mg/dL (ref 0.44–1.00)
GFR, Estimated: >60 mL/min (ref >60)
Glucose, Bld: 137 mg/dL — ABNORMAL HIGH (ref 70–99)
Potassium: 4.2 mmol/L (ref 3.5–5.1)
Sodium: 138 mmol/L (ref 135–145)

## 2023-03-13 LAB — CBC
HCT: 36.6 % (ref 36.0–46.0)
Hemoglobin: 11.3 g/dL — ABNORMAL LOW (ref 12.0–15.0)
MCH: 28 pg (ref 26.0–34.0)
MCHC: 30.9 g/dL (ref 30.0–36.0)
MCV: 90.8 fL (ref 80.0–100.0)
Platelets: 238 10*3/uL (ref 150–400)
RBC: 4.03 MIL/uL (ref 3.87–5.11)
RDW: 14.2 % (ref 11.5–15.5)
WBC: 8.4 10*3/uL (ref 4.0–10.5)
nRBC: 0 % (ref 0.0–0.2)

## 2023-03-13 LAB — MAGNESIUM: Magnesium: 2.3 mg/dL (ref 1.7–2.4)

## 2023-03-13 MED ORDER — TRAMADOL HCL 50 MG PO TABS
50.0000 mg | ORAL_TABLET | Freq: Four times a day (QID) | ORAL | 0 refills | Status: AC | PRN
  Filled 2023-03-13: qty 10, 3d supply, fill #0

## 2023-03-13 MED ORDER — ONDANSETRON 4 MG PO TBDP
4.0000 mg | ORAL_TABLET | Freq: Three times a day (TID) | ORAL | 0 refills | Status: DC | PRN
  Filled 2023-03-13: qty 20, 7d supply, fill #0

## 2023-03-13 MED ORDER — IOHEXOL 300 MG/ML  SOLN: 50.0000 mL | Freq: Once | INTRAMUSCULAR | Status: DC | PRN

## 2023-03-13 MED ORDER — DOCUSATE SODIUM 100 MG PO CAPS
100.0000 mg | ORAL_CAPSULE | Freq: Two times a day (BID) | ORAL | 0 refills | Status: DC
  Filled 2023-03-13: qty 30, 15d supply, fill #0

## 2023-03-13 NOTE — Progress Notes (Signed)
   03/13/23 0850  TOC Brief Assessment  Insurance and Status Reviewed  Patient has primary care physician Yes  Home environment has been reviewed Resides with spouse  Prior level of function: Independent at baseline  Prior/Current Home Services No current home services  Social Determinants of Health Reivew SDOH reviewed no interventions necessary  Readmission risk has been reviewed Yes  Transition of care needs no transition of care needs at this time

## 2023-03-13 NOTE — Progress Notes (Signed)
S: No issues overnight, slept fairly well.  No significant pain or nausea.  Tolerating clear liquids without any dysphagia or issue.  O: Vitals, labs, intake/output, and orders reviewed at this time.  Afebrile, no tachycardia, normotensive, saturating well on room air.  PO 475, uop recorded as 3 occurrences overnight, BMP/magnesium and CBC are all unremarkable, no leukocytosis and hemoglobin is stable.   Gen: A&Ox3, no distress  H&N: EOMI, atraumatic, neck supple Chest: unlabored respirations, RRR Abd: soft, nontender, nondistended, incision(s) c/d/i with Dermabond, no cellulitis or hematoma Ext: warm, no edema Neuro: grossly normal  Lines/tubes/drains: PIV  A/P: Postop day 1 status post robotic paraesophageal hernia repair with toupet fundoplication, doing well -Upper GI this morning -Advance to pured diet -Continue pulmonary toilet, ambulate as tolerated, SCDs while in bed and chemical DVT prophylaxis -Discharge home today   Denise Blakes, MD [facility], [place]

## 2023-03-14 ENCOUNTER — Telehealth: Payer: Self-pay | Admitting: Surgical

## 2023-03-14 NOTE — Telephone Encounter (Signed)
Called Pt to reschedule her postop appt with Dr. Franky Macho. Rescheduled it for 11/1 2pm

## 2023-03-22 NOTE — Progress Notes (Signed)
Hi Debbie.  CT scan done.  I will send notification to [facility]

## 2023-03-25 ENCOUNTER — Other Ambulatory Visit: Payer: Self-pay | Admitting: Family Medicine

## 2023-03-27 ENCOUNTER — Telehealth: Payer: Self-pay | Admitting: Gastroenterology

## 2023-03-27 NOTE — Telephone Encounter (Signed)
Inbound call from patient stating she received a message that she needs to come in for labs. Patient states she will be having labs on Friday 10/4 with her pcp. Patient is requesting to know if those labs are able to be looked at instead or if she needs to come in to have labs done specifically for Dr. Tomasa Morales. Patient requesting a call to confirm. Please advise, thank you.

## 2023-03-27 NOTE — Telephone Encounter (Signed)
Pt states her PCP will check her blood count. Discussed with her that Dr. Tomasa Rand has ordered additional labs other than a CBC. Pt states she will come her to have the labs drawn.

## 2023-03-28 MED ORDER — PREGABALIN 75 MG PO CAPS
75.0000 mg | ORAL_CAPSULE | Freq: Two times a day (BID) | ORAL | 0 refills | Status: DC
Start: 2023-03-28 — End: 2023-04-17

## 2023-03-30 ENCOUNTER — Other Ambulatory Visit: Payer: Medicaid Other

## 2023-03-30 DIAGNOSIS — E785 Hyperlipidemia, unspecified: Secondary | ICD-10-CM

## 2023-04-02 NOTE — Telephone Encounter (Signed)
PT is calling to find out when she should come in for the additional lab work Dr. Tomasa Rand requested. There is not an order showing. Please advise.

## 2023-04-02 NOTE — Telephone Encounter (Signed)
Spoke with pt and let her know she can come anytime M-F between the hours of 7:30am-5pm, no appt is needed. She verbalized understanding.

## 2023-04-03 ENCOUNTER — Other Ambulatory Visit (INDEPENDENT_AMBULATORY_CARE_PROVIDER_SITE_OTHER): Payer: Medicaid Other

## 2023-04-03 ENCOUNTER — Encounter: Payer: Self-pay | Admitting: Family Medicine

## 2023-04-03 ENCOUNTER — Ambulatory Visit: Payer: Medicaid Other | Admitting: Family Medicine

## 2023-04-03 VITALS — BP 130/88 | HR 70 | Temp 97.5°F | Ht 59.0 in | Wt 175.0 lb

## 2023-04-03 DIAGNOSIS — F339 Major depressive disorder, recurrent, unspecified: Secondary | ICD-10-CM | POA: Diagnosis not present

## 2023-04-03 DIAGNOSIS — D509 Iron deficiency anemia, unspecified: Secondary | ICD-10-CM | POA: Diagnosis not present

## 2023-04-03 DIAGNOSIS — I10 Essential (primary) hypertension: Secondary | ICD-10-CM

## 2023-04-03 DIAGNOSIS — E785 Hyperlipidemia, unspecified: Secondary | ICD-10-CM | POA: Diagnosis not present

## 2023-04-03 DIAGNOSIS — E039 Hypothyroidism, unspecified: Secondary | ICD-10-CM | POA: Diagnosis not present

## 2023-04-03 LAB — CBC WITH DIFFERENTIAL/PLATELET
Basophils Absolute: 0 10*3/uL (ref 0.0–0.1)
Basophils Relative: 0.8 % (ref 0.0–3.0)
Eosinophils Absolute: 0.2 10*3/uL (ref 0.0–0.7)
Eosinophils Relative: 3.8 % (ref 0.0–5.0)
HCT: 39.3 % (ref 36.0–46.0)
Hemoglobin: 12.7 g/dL (ref 12.0–15.0)
Lymphocytes Relative: 41.6 % (ref 12.0–46.0)
Lymphs Abs: 2.3 10*3/uL (ref 0.7–4.0)
MCHC: 32.3 g/dL (ref 30.0–36.0)
MCV: 86.2 fL (ref 78.0–100.0)
Monocytes Absolute: 0.4 10*3/uL (ref 0.1–1.0)
Monocytes Relative: 7.1 % (ref 3.0–12.0)
Neutro Abs: 2.6 10*3/uL (ref 1.4–7.7)
Neutrophils Relative %: 46.7 % (ref 43.0–77.0)
Platelets: 254 10*3/uL (ref 150.0–400.0)
RBC: 4.56 Mil/uL (ref 3.87–5.11)
RDW: 15.3 % (ref 11.5–15.5)
WBC: 5.5 10*3/uL (ref 4.0–10.5)

## 2023-04-03 LAB — IBC + FERRITIN
Ferritin: 32.4 ng/mL (ref 10.0–291.0)
Iron: 159 ug/dL — ABNORMAL HIGH (ref 42–145)
Saturation Ratios: 37.1 % (ref 20.0–50.0)
TIBC: 428.4 ug/dL (ref 250.0–450.0)
Transferrin: 306 mg/dL (ref 212.0–360.0)

## 2023-04-03 LAB — IRON: Iron: 159 ug/dL — ABNORMAL HIGH (ref 42–145)

## 2023-04-03 NOTE — Assessment & Plan Note (Signed)
Synthroid refill provided today. Continue daily. Last TSH normal.

## 2023-04-03 NOTE — Assessment & Plan Note (Signed)
Depression and grief reaction complicated by recent surgical course. Continue Lexapro 20mg  daily and ambien/seroquel for sleep. Will refer to therapy. Denis SI/HI.      10/23/2022    3:17 PM 12/01/2020    2:49 PM 08/23/2020   10:31 AM 03/30/2020   10:55 AM 03/02/2020   10:08 AM  Depression screen PHQ 2/9  Decreased Interest 2 0 0 1 0  Down, Depressed, Hopeless 0 1 1 1  0  PHQ - 2 Score 2 1 1 2  0  Altered sleeping 1   2   Tired, decreased energy 3   3   Change in appetite 0   0   Feeling bad or failure about yourself  0   1   Trouble concentrating 0   0   Moving slowly or fidgety/restless 0   0   Suicidal thoughts 0   0   PHQ-9 Score 6   8   Difficult doing work/chores Somewhat difficult   Very difficult

## 2023-04-03 NOTE — Progress Notes (Signed)
Subjective:  HPI: Denise Morales is a 62 y.o. female presenting on 04/03/2023 for Follow-up (3 month f/u)   HPI Patient is in today for chronic condition follow-up. She is having left shoulder surgery on 10/21. Today Denise Morales reports she has chronic uncontrolled pain as well as depression due to loss of several family members over the last few years. She is satisfied with her current medication regimen but reports she is ready to get through her surgeries and resume with her daily activities and hobbies she enjoys such as cleaning and working in her shed. She does not see a therapist but is open to this.  HYPERTENSION / HYPERLIPIDEMIA Satisfied with current treatment? yes Duration of hypertension: chronic BP monitoring frequency: not checking BP range:  BP medication side effects: no Past BP meds: lisinopril-HCTZ Duration of hyperlipidemia: chronic Cholesterol medication side effects: no Cholesterol supplements: fish oil Past cholesterol medications: pravastatin (pravachol) Medication compliance: excellent compliance Aspirin: no Recent stressors: no Recurrent headaches: no Visual changes: no Palpitations: no Dyspnea: no Chest pain: no Lower extremity edema: no Dizzy/lightheaded: no  HYPOTHYROIDISM Thyroid control status:controlled Satisfied with current treatment? yes Medication side effects: no Medication compliance: excellent compliance Etiology of hypothyroidism:  Recent dose adjustment:no Fatigue: no Cold intolerance: no Heat intolerance: no Weight gain: yes Weight loss: no Constipation: no Diarrhea/loose stools: yes Palpitations: no Lower extremity edema: no Anxiety/depressed mood: no   Review of Systems  All other systems reviewed and are negative.   Relevant past medical history reviewed and updated as indicated.   Past Medical History:  Diagnosis Date   Allergy    Anemia    Anxiety    Arthritis    Depression    Fibromyalgia    GERD  (gastroesophageal reflux disease)    History of hiatal hernia    Hyperlipidemia    Hypertension    Hypothyroidism    Melanoma (HCC)    face   Thyroid disease      Past Surgical History:  Procedure Laterality Date   ABDOMINAL HYSTERECTOMY  1985   AIKEN OSTEOTOMY Right 06/05/2022   Procedure: Quintella Reichert OSTEOTOMY;  Surgeon: Candelaria Stagers, DPM;  Location: [facility];  Service: Podiatry;  Laterality: Right;   BICEPT TENODESIS Right 12/05/2022   Procedure: RIGHT BICEPS TENODESIS;  Surgeon: Cammy Copa, MD;  Location: [facility] OR;  Service: Orthopedics;  Laterality: Right;   CAPSULOTOMY METATARSOPHALANGEAL Right 06/05/2022   Procedure: CAPSULOTOMY METATARSOPHALANGEAL;  Surgeon: Candelaria Stagers, DPM;  Location: [facility];  Service: Podiatry;  Laterality: Right;   CHOLECYSTECTOMY     COLONOSCOPY     HALLUX VALGUS LAPIDUS Right 06/05/2022   Procedure: HALLUX VALGUS LAPIDUS;  Surgeon: Candelaria Stagers, DPM;  Location: [facility];  Service: Podiatry;  Laterality: Right;   HAMMER TOE SURGERY Right 06/05/2022   Procedure: HAMMER TOE CORRECTION;  Surgeon: Candelaria Stagers, DPM;  Location: [facility] ;  Service: Podiatry;  Laterality: Right;   REVERSE SHOULDER ARTHROPLASTY Right 12/05/2022   Procedure: RIGHT REVERSE SHOULDER ARTHROPLASTY;  Surgeon: Cammy Copa, MD;  Location: [facility] OR;  Service: Orthopedics;  Laterality: Right;   SHOULDER ARTHROSCOPY WITH ROTATOR CUFF REPAIR AND SUBACROMIAL DECOMPRESSION Right 08/12/2020   Procedure: RIGHT SHOULDER ARTHROSCOPY WITH ROTATOR CUFF REPAIR AND SUBACROMIAL DECOMPRESSION;  Surgeon: Kathryne Hitch, MD;  Location: [facility];  Service: Orthopedics;  Laterality: Right;   TOTAL KNEE ARTHROPLASTY Right 12/24/2020   Procedure: RIGHT TOTAL KNEE ARTHROPLASTY;  Surgeon:  Kathryne Hitch, MD;  Location: [facility];  Service: Orthopedics;  Laterality: Right;   TOTAL KNEE  ARTHROPLASTY Left 07/15/2021   Procedure: LEFT TOTAL KNEE ARTHROPLASTY;  Surgeon: Kathryne Hitch, MD;  Location: [facility];  Service: Orthopedics;  Laterality: Left;   XI ROBOTIC ASSISTED PARAESOPHAGEAL HERNIA REPAIR N/A 03/12/2023   Procedure: XI ROBOTIC ASSISTED PARAESOPHAGEAL HERNIA REPAIR WITH FUNDOPLICATION;  Surgeon: Berna Bue, MD;  Location: [facility];  Service: General;  Laterality: N/A;  180    Allergies and medications reviewed and updated.   Current Outpatient Medications:    acetaminophen (TYLENOL) 500 MG tablet, Take 500-1,000 mg by mouth every 6 (six) hours as needed (pain)., Disp: , Rfl:    celecoxib (CELEBREX) 200 MG capsule, Take 1 capsule (200 mg total) by mouth 2 (two) times daily between meals as needed. (Patient taking differently: Take 200 mg by mouth 2 (two) times daily.), Disp: 60 capsule, Rfl: 3   dicyclomine (BENTYL) 10 MG capsule, Take 10 mg by mouth 4 (four) times daily as needed for spasms., Disp: , Rfl:    diphenhydrAMINE (BENADRYL) 25 mg capsule, Take 25 mg by mouth at bedtime as needed for allergies., Disp: , Rfl:    escitalopram (LEXAPRO) 20 MG tablet, Take 1 tablet (20 mg total) by mouth daily., Disp: 90 tablet, Rfl: 1   Iron, Ferrous Sulfate, 325 (65 Fe) MG TABS, Take 325 mg by mouth daily., Disp: 90 tablet, Rfl: 1   Krill Oil 500 MG CAPS, Take 500 mg by mouth in the morning., Disp: , Rfl:    levothyroxine (SYNTHROID) 50 MCG tablet, Take 1 tablet (50 mcg total) by mouth daily., Disp: 90 tablet, Rfl: 3   lisinopril-hydrochlorothiazide (ZESTORETIC) 10-12.5 MG tablet, Take 1 tablet by mouth daily., Disp: 90 tablet, Rfl: 1   pantoprazole (PROTONIX) 40 MG tablet, TAKE 1 TABLET BY MOUTH TWICE DAILY BEFORE A MEAL (Patient taking differently: Take 40 mg by mouth daily.), Disp: 60 tablet, Rfl: 2   pravastatin (PRAVACHOL) 40 MG tablet, Take 1 tablet (40 mg total) by mouth daily. (Patient taking differently: Take 40 mg by mouth at bedtime.), Disp: 90 tablet, Rfl:  1   pregabalin (LYRICA) 75 MG capsule, Take 1 capsule (75 mg total) by mouth 2 (two) times daily., Disp: 120 capsule, Rfl: 0   QUEtiapine (SEROQUEL) 50 MG tablet, Take 1 tablet (50 mg total) by mouth See admin instructions. Take 1 tablet (50 mg) by mouth at bedtime on Mondays, Wednesdays, Fridays & Saturdays., Disp: 12 tablet, Rfl: 1   sucralfate (CARAFATE) 1 g tablet, TAKE 1 TABLET BY MOUTH THREE TIMES DAILY AS NEEDED FOR HEART BURN, Disp: 90 tablet, Rfl: 1   TURMERIC PO, Take 1,000 mg by mouth in the morning., Disp: , Rfl:    Vitamin D-Vitamin K (VITAMIN K2-VITAMIN D3 PO), Take 1 tablet by mouth in the morning. 100 mg/50 mcg, Disp: , Rfl:    zolpidem (AMBIEN) 5 MG tablet, TAKE 1 TABLET BY MOUTH EVERY OTHER NIGHT. ALTERNATING WITH QUETIAPINE, Disp: 15 tablet, Rfl: 0  Allergies  Allergen Reactions   Hydroxyzine Other (See Comments)    hallucinations    Objective:   BP 130/88   Pulse 70   Temp (!) 97.5 F (36.4 C) (Oral)   Ht 4\' 11"  (1.499 m)   Wt 175 lb (79.4 kg)   SpO2 97%   BMI 35.35 kg/m      04/03/2023    9:27 AM 03/13/2023    5:27 AM 03/13/2023  1:46 AM  Vitals with BMI  Height 4\' 11"     Weight 175 lbs    BMI 35.33    Systolic 130 115 161  Diastolic 88 77 80  Pulse 70 66 70     Physical Exam Vitals and nursing note reviewed.  Constitutional:      Appearance: Normal appearance. She is normal weight.  HENT:     Head: Normocephalic and atraumatic.  Cardiovascular:     Rate and Rhythm: Normal rate and regular rhythm.     Pulses: Normal pulses.     Heart sounds: Normal heart sounds.  Pulmonary:     Effort: Pulmonary effort is normal.     Breath sounds: Normal breath sounds.  Skin:    General: Skin is warm and dry.  Neurological:     General: No focal deficit present.     Mental Status: She is alert and oriented to person, place, and time. Mental status is at baseline.  Psychiatric:        Mood and Affect: Mood normal.        Behavior: Behavior normal.         Thought Content: Thought content normal.        Judgment: Judgment normal.     Assessment & Plan:  Essential hypertension Assessment & Plan: Chronic at goal <140/90. Continue Lisinopril-HCTZ 10-12.5mg  daily. Recommend heart healthy diet such as Mediterranean diet with whole grains, fruits, vegetable, fish, lean meats, nuts, and olive oil. Limit salt. Encouraged moderate walking, 3-5 times/week for 30-50 minutes each session. Aim for at least 150 minutes.week. Goal should be pace of 3 miles/hours, or walking 1.5 miles in 30 minutes. Avoid tobacco products. Avoid excess alcohol. Take medications as prescribed and bring medications and blood pressure log with cuff to each office visit. Seek medical care for chest pain, palpitations, shortness of breath with exertion, dizziness/lightheadedness, vision changes, recurrent headaches, or swelling of extremities.    Depression, recurrent (HCC) Assessment & Plan: Depression and grief reaction complicated by recent surgical course. Continue Lexapro 20mg  daily and ambien/seroquel for sleep. Will refer to therapy. Denis SI/HI.      10/23/2022    3:17 PM 12/01/2020    2:49 PM 08/23/2020   10:31 AM 03/30/2020   10:55 AM 03/02/2020   10:08 AM  Depression screen PHQ 2/9  Decreased Interest 2 0 0 1 0  Down, Depressed, Hopeless 0 1 1 1  0  PHQ - 2 Score 2 1 1 2  0  Altered sleeping 1   2   Tired, decreased energy 3   3   Change in appetite 0   0   Feeling bad or failure about yourself  0   1   Trouble concentrating 0   0   Moving slowly or fidgety/restless 0   0   Suicidal thoughts 0   0   PHQ-9 Score 6   8   Difficult doing work/chores Somewhat difficult   Very difficult      Orders: -     Ambulatory referral to Psychiatry  Hypothyroidism, unspecified type Assessment & Plan: Synthroid refill provided today. Continue daily. Last TSH normal.   Hyperlipidemia, unspecified hyperlipidemia type Assessment & Plan: Continue Pravastatin and krill  oil. I recommend consuming a heart healthy diet such as Mediterranean diet or DASH diet with whole grains, fruits, vegetable, fish, lean meats, nuts, and olive oil. Limit sweets and processed foods. I also encourage moderate intensity exercise 150 minutes weekly. This is 3-5 times  weekly for 30-50 minutes each session. Goal should be pace of 3 miles/hours, or walking 1.5 miles in 30 minutes. The 10-year ASCVD risk score (Arnett DK, et al., 2019) is: 6.6%       Follow up plan: Return in about 3 months (around 07/04/2023) for hypertension, weight management, follow-up.  Park Meo, FNP

## 2023-04-03 NOTE — Assessment & Plan Note (Signed)
Chronic at goal <140/90. Continue Lisinopril-HCTZ 10-12.5mg  daily. Recommend heart healthy diet such as Mediterranean diet with whole grains, fruits, vegetable, fish, lean meats, nuts, and olive oil. Limit salt. Encouraged moderate walking, 3-5 times/week for 30-50 minutes each session. Aim for at least 150 minutes.week. Goal should be pace of 3 miles/hours, or walking 1.5 miles in 30 minutes. Avoid tobacco products. Avoid excess alcohol. Take medications as prescribed and bring medications and blood pressure log with cuff to each office visit. Seek medical care for chest pain, palpitations, shortness of breath with exertion, dizziness/lightheadedness, vision changes, recurrent headaches, or swelling of extremities.

## 2023-04-03 NOTE — Assessment & Plan Note (Signed)
Continue Pravastatin and krill oil. I recommend consuming a heart healthy diet such as Mediterranean diet or DASH diet with whole grains, fruits, vegetable, fish, lean meats, nuts, and olive oil. Limit sweets and processed foods. I also encourage moderate intensity exercise 150 minutes weekly. This is 3-5 times weekly for 30-50 minutes each session. Goal should be pace of 3 miles/hours, or walking 1.5 miles in 30 minutes. The 10-year ASCVD risk score (Arnett DK, et al., 2019) is: 6.6%

## 2023-04-04 ENCOUNTER — Telehealth: Payer: Self-pay

## 2023-04-04 LAB — LIPID PANEL
Cholesterol: 166 mg/dL (ref <200)
HDL: 49 mg/dL — ABNORMAL LOW (ref >=50)
LDL Cholesterol (Calc): 84 mg/dL
Non-HDL Cholesterol (Calc): 117 mg/dL (ref <130)
Total CHOL/HDL Ratio: 3.4 (calc) (ref <5.0)
Triglycerides: 250 mg/dL — ABNORMAL HIGH (ref <150)

## 2023-04-04 LAB — TEST AUTHORIZATION

## 2023-04-04 LAB — TSH: TSH: 3.05 m[IU]/L (ref 0.40–4.50)

## 2023-04-04 NOTE — Telephone Encounter (Signed)
Pt would like for you to look at her lab from yesterday and let her know of any concerns? She was thinking that her blood count was low?  Pls advise

## 2023-04-06 NOTE — Progress Notes (Signed)
Denise Morales,  Your blood counts and iron panel looked great.  I would not recommend any further testing for iron deficiency at this time.

## 2023-04-08 ENCOUNTER — Other Ambulatory Visit: Payer: Self-pay | Admitting: Family Medicine

## 2023-04-10 ENCOUNTER — Encounter (HOSPITAL_COMMUNITY): Payer: Self-pay

## 2023-04-10 NOTE — Pre-Procedure Instructions (Signed)
Surgical Instructions    Your procedure is scheduled on Monday, October 21st.  Report to [facility] Main Entrance "A" at 0530 A.M., then check in with the Admitting office.  Call this number if you have problems the morning of surgery:  [phone]  If you have any questions prior to your surgery date call [phone]: Open Monday-Friday 8am-4pm If you experience any cold or flu symptoms such as cough, fever, chills, shortness of breath, etc. between now and your scheduled surgery, please notify us at the above number.     Remember:  Do not eat after midnight the night before your surgery  You may drink clear liquids until 0430 AM the morning of your surgery.   Clear liquids allowed are: Water, Non-Citrus Juices (without pulp), Carbonated Beverages, Clear Tea, Black Coffee Only (NO MILK, CREAM OR POWDERED CREAMER of any kind), and Gatorade.  Patient Instructions  The night before surgery:  No food after midnight. ONLY clear liquids after midnight  The day of surgery (if you do NOT have diabetes):  Drink ONE (1) Pre-Surgery Clear Ensure by 0430 AM the morning of surgery. Drink in one sitting. Do not sip.  This drink was given to you during your hospital  pre-op appointment visit.  Nothing else to drink after completing the  Pre-Surgery Clear Ensure.          If you have questions, please contact your surgeon's office.     Take these medicines the morning of surgery with A SIP OF WATER :             escitalopram (LEXAPRO)              levothyroxine (SYNTHROID)              pantoprazole (PROTONIX)              pregabalin (LYRICA)              sucralfate (CARAFATE)               As needed:             acetaminophen (TYLENOL)             celecoxib (CELEBREX)              dicyclomine (BENTYL)              diphenhydrAMINE (BENADRYL)  As of today, STOP taking any Aspirin (unless otherwise instructed by your surgeon) Aleve, Naproxen, Ibuprofen, Motrin, Advil, Goody's, BC's,  all herbal medications, fish oil, and all vitamins.                     Do NOT Smoke (Tobacco/Vaping) for 24 hours prior to your procedure.  If you use a CPAP at night, you may bring your mask/headgear for your overnight stay.   Contacts, glasses, piercing's, hearing aid's, dentures or partials may not be worn into surgery, please bring cases for these belongings.    For patients admitted to the hospital, discharge time will be determined by your treatment team.   Patients discharged the day of surgery will not be allowed to drive home, and someone needs to stay with them for 24 hours.  SURGICAL WAITING ROOM VISITATION Patients having surgery or a procedure may have no more than 2 support people in the waiting area - these visitors may rotate.   Children under the age of 66 must have an adult with them who is not the patient. If  the patient needs to stay at the hospital during part of their recovery, the visitor guidelines for inpatient rooms apply. Pre-op nurse will coordinate an appropriate time for 1 support person to accompany patient in pre-op.  This support person may not rotate.   Please refer to the [facility] website for the visitor guidelines for Inpatients (after your surgery is over and you are in a regular room).    Special instructions:     Pre-operative 5 CHG Bath Instructions   You can play a key role in reducing the risk of infection after surgery. Your skin needs to be as free of germs as possible. You can reduce the number of germs on your skin by washing with CHG (chlorhexidine gluconate) soap before surgery. CHG is an antiseptic soap that kills germs and continues to kill germs even after washing.   DO NOT use if you have an allergy to chlorhexidine/CHG or antibacterial soaps. If your skin becomes reddened or irritated, stop using the CHG and notify one of our RNs at [phone].   Please shower with the CHG soap starting 4 days before surgery using the following  schedule:     Please keep in mind the following:  DO NOT shave, including legs and underarms, starting the day of your first shower.   You may shave your face at any point before/day of surgery.  Place clean sheets on your bed the day you start using CHG soap. Use a clean washcloth (not used since being washed) for each shower. DO NOT sleep with pets once you start using the CHG.   CHG Shower Instructions:  If you choose to wash your hair and private area, wash first with your normal shampoo/soap.  After you use shampoo/soap, rinse your hair and body thoroughly to remove shampoo/soap residue.  Turn the water OFF and apply about 3 tablespoons (45 ml) of CHG soap to a CLEAN washcloth.  Apply CHG soap ONLY FROM YOUR NECK DOWN TO YOUR TOES (washing for 3-5 minutes)  DO NOT use CHG soap on face, private areas, open wounds, or sores.  Pay special attention to the area where your surgery is being performed.  If you are having back surgery, having someone wash your back for you may be helpful. Wait 2 minutes after CHG soap is applied, then you may rinse off the CHG soap.  Pat dry with a clean towel  Put on clean clothes/pajamas   If you choose to wear lotion, please use ONLY the CHG-compatible lotions on the back of this paper.     Additional instructions for the day of surgery: DO NOT APPLY any lotions, deodorants, cologne, or perfumes.   Put on clean/comfortable clothes.  Brush your teeth.  Ask your nurse before applying any prescription medications to the skin.      CHG Compatible Lotions   Aveeno Moisturizing lotion  Cetaphil Moisturizing Cream  Cetaphil Moisturizing Lotion  Clairol Herbal Essence Moisturizing Lotion, Dry Skin  Clairol Herbal Essence Moisturizing Lotion, Extra Dry Skin  Clairol Herbal Essence Moisturizing Lotion, Normal Skin  Curel Age Defying Therapeutic Moisturizing Lotion with Alpha Hydroxy  Curel Extreme Care Body Lotion  Curel Soothing Hands Moisturizing  Hand Lotion  Curel Therapeutic Moisturizing Cream, Fragrance-Free  Curel Therapeutic Moisturizing Lotion, Fragrance-Free  Curel Therapeutic Moisturizing Lotion, Original Formula  Eucerin Daily Replenishing Lotion  Eucerin Dry Skin Therapy Plus Alpha Hydroxy Crme  Eucerin Dry Skin Therapy Plus Alpha Hydroxy Lotion  Eucerin Original Crme  Eucerin Original Lotion  Eucerin  Plus Crme Eucerin Plus Lotion  Eucerin TriLipid Replenishing Lotion  Keri Anti-Bacterial Hand Lotion  Keri Deep Conditioning Original Lotion Dry Skin Formula Softly Scented  Keri Deep Conditioning Original Lotion, Fragrance Free Sensitive Skin Formula  Keri Lotion Fast Absorbing Fragrance Free Sensitive Skin Formula  Keri Lotion Fast Absorbing Softly Scented Dry Skin Formula  Keri Original Lotion  Keri Skin Renewal Lotion Keri Silky Smooth Lotion  Keri Silky Smooth Sensitive Skin Lotion  Nivea Body Creamy Conditioning Oil  Nivea Body Extra Enriched Lotion  Nivea Body Original Lotion  Nivea Body Sheer Moisturizing Lotion Nivea Crme  Nivea Skin Firming Lotion  NutraDerm 30 Skin Lotion  NutraDerm Skin Lotion  NutraDerm Therapeutic Skin Cream  NutraDerm Therapeutic Skin Lotion  ProShield Protective Hand Cream  Provon moisturizing lotion   Please read over the following fact sheets that you were given.  Oral Hygiene is also important to reduce your risk of infection.  Remember - BRUSH YOUR TEETH THE MORNING OF SURGERY WITH YOUR REGULAR TOOTHPASTE  [place]- Preparing for Total Shoulder Arthroplasty  Before surgery, you can play an important role. Because skin is not sterile, your skin needs to be as free of germs as possible. You can reduce the number of germs on your skin by using the following products.   Benzoyl Peroxide Gel  o Reduces the number of germs present on the skin  o Applied twice a day to shoulder area starting two days before surgery   Chlorhexidine Gluconate (CHG) Soap (instructions  listed above on how to wash with CHG Soap)  o An antiseptic cleaner that kills germs and bonds with the skin to continue killing germs even after washing  o Used for showering the night before surgery and morning of surgery   ==================================================================  Please follow these instructions carefully:  BENZOYL PEROXIDE 5% GEL  Please do not use if you have an allergy to benzoyl peroxide. If your skin becomes reddened/irritated stop using the benzoyl peroxide.  Starting two days before surgery, apply as follows:  1. Apply benzoyl peroxide in the morning and at night. Apply after taking a shower. If you are not taking a shower clean entire shoulder front, back, and side along with the armpit with a clean wet washcloth.  2. Place a quarter-sized dollop on your SHOULDER and rub in thoroughly, making sure to cover the front, back, and side of your shoulder, along with the armpit.   2 Days prior to Surgery First Dose on _____________ Morning Second Dose on ______________ Night  Day Before Surgery First Dose on ______________ Morning Night before surgery wash (entire body except face and private areas) with CHG Soap THEN Second Dose on ____________ Night   Morning of Surgery  wash BODY AGAIN with CHG Soap   4. Do NOT apply benzoyl peroxide gel on the day of surgery   Please read over the following fact sheets that you were given.          If you received a COVID test during your pre-op visit  it is requested that you wear a mask when out in public, stay away from anyone that may not be feeling well and notify your surgeon if you develop symptoms. If you have been in contact with anyone that has tested positive in the last 10 days please notify you surgeon.

## 2023-04-10 NOTE — Progress Notes (Signed)
PCP - Kurtis Bushman, FNP Cardiologist - none  Chest x-ray - n/a EKG - 10/09/22 Stress Test - 11/18/21 ECHO - 10/25/21 Cardiac Cath - n/a  ICD Pacemaker/Loop - n/a  Sleep Study -  n/a  Diabetes - n/a   ERAS: clear liquids til 4:30 AM DOS.  Please complete your PRE-SURGERY ENSURE that was provided to you by 4:30 AM, the morning of surgery.  Please, if able, drink it in one setting. DO NOT SIP.  This will be the last thing that you drink.  Anesthesia review: Yes  STOP now taking any Aspirin (unless otherwise instructed by your surgeon), Aleve, Naproxen, Ibuprofen, Motrin, Advil, Celebrex, Goody's, BC's, all herbal medications, fish oil, and all vitamins.   Coronavirus Screening Do you have any of the following symptoms:  Cough yes/no: No Fever (>100.50F)  yes/no: No Runny nose yes/no: No Sore throat yes/no: No Difficulty breathing/shortness of breath  yes/no: No  Have you traveled in the last 14 days and where? yes/no: No  Patient verbalized understanding of instructions that were given to them at the PAT appointment. Patient was also instructed that they will need to review over the PAT instructions again at home before surgery.

## 2023-04-11 ENCOUNTER — Encounter (HOSPITAL_COMMUNITY): Payer: Self-pay

## 2023-04-11 ENCOUNTER — Other Ambulatory Visit: Payer: Self-pay

## 2023-04-11 ENCOUNTER — Encounter (HOSPITAL_COMMUNITY)
Admission: RE | Admit: 2023-04-11 | Discharge: 2023-04-11 | Disposition: A | Discharge status: Home / Self Care | Payer: Medicaid Other | Source: Ambulatory Visit | Attending: Orthopedic Surgery | Admitting: Orthopedic Surgery

## 2023-04-11 VITALS — BP 124/77 | HR 76 | Temp 98.1°F | Resp 18 | Ht 59.0 in | Wt 173.1 lb

## 2023-04-11 DIAGNOSIS — K449 Diaphragmatic hernia without obstruction or gangrene: Secondary | ICD-10-CM | POA: Insufficient documentation

## 2023-04-11 DIAGNOSIS — Z01818 Encounter for other preprocedural examination: Secondary | ICD-10-CM | POA: Diagnosis present

## 2023-04-11 HISTORY — DX: Cardiac murmur, unspecified: R01.1

## 2023-04-11 LAB — URINALYSIS, W/ REFLEX TO CULTURE (INFECTION SUSPECTED)
Bacteria, UA: NONE SEEN
Bilirubin Urine: NEGATIVE
Glucose, UA: NEGATIVE mg/dL
Hgb urine dipstick: NEGATIVE
Ketones, ur: NEGATIVE mg/dL
Nitrite: NEGATIVE
Protein, ur: NEGATIVE mg/dL
Specific Gravity, Urine: 1.02 (ref 1.005–1.030)
pH: 6 (ref 5.0–8.0)

## 2023-04-11 LAB — BASIC METABOLIC PANEL
Anion gap: 9 (ref 5–15)
BUN: 21 mg/dL (ref 8–23)
CO2: 26 mmol/L (ref 22–32)
Calcium: 9 mg/dL (ref 8.9–10.3)
Chloride: 104 mmol/L (ref 98–111)
Creatinine, Ser: 0.92 mg/dL (ref 0.44–1.00)
GFR, Estimated: >60 mL/min (ref >60)
Glucose, Bld: 105 mg/dL — ABNORMAL HIGH (ref 70–99)
Potassium: 3.4 mmol/L — ABNORMAL LOW (ref 3.5–5.1)
Sodium: 139 mmol/L (ref 135–145)

## 2023-04-11 LAB — CBC
HCT: 40.8 % (ref 36.0–46.0)
Hemoglobin: 13.1 g/dL (ref 12.0–15.0)
MCH: 28.2 pg (ref 26.0–34.0)
MCHC: 32.1 g/dL (ref 30.0–36.0)
MCV: 87.7 fL (ref 80.0–100.0)
Platelets: 281 10*3/uL (ref 150–400)
RBC: 4.65 MIL/uL (ref 3.87–5.11)
RDW: 14.6 % (ref 11.5–15.5)
WBC: 8 10*3/uL (ref 4.0–10.5)
nRBC: 0 % (ref 0.0–0.2)

## 2023-04-11 LAB — SURGICAL PCR SCREEN
MRSA, PCR: NEGATIVE
Staphylococcus aureus: NEGATIVE

## 2023-04-11 LAB — TYPE AND SCREEN
ABO/RH(D): O POS
Antibody Screen: NEGATIVE

## 2023-04-12 NOTE — Progress Notes (Signed)
Anesthesia Chart Review:  62 year old female with pertinent history including HTN, GERD, HLD, fibromyalgia, hiatal hernia s/p robotic assisted fundoplication 03/12/2023, hypothyroidism.  Prior cardiology evaluation by Dr. Odis Hollingshead in spring 2023 for atypical chest pain. Coronary calcium score noted total CAC of 0, echocardiogram notes preserved LVEF with normal diastolic function and no significant valvular heart disease, and exercise treadmill stress test was reported to be intermediate risk but stress ECG negative for ischemia. The intermediate risk was secondary to reduced exercise capacity.  Last seen 11/25/2021, at that time she was structured to follow-up with PCP for management of chronic conditions, follow-up with cardiology on an as-needed basis.  Last seen by general surgeon Dr. Doylene Canard on 03/29/2023 for follow-up of recent fundoplication.  Per note, doing well, no significant pain, significant improvement in reflux and GERD symptoms.  Preop labs reviewed, unremarkable.  EKG 10/09/2022: Sinus rhythm.  Rate 89.  Low voltage, precordial leads.  Abnormal R wave progression, early transition.  Borderline T abnormalities, anterior leads.  Exercise treadmill stress test 11/18/2021: Exercise treadmill stress test performed using Bruce protocol.  Patient reached 5.1 METS, and 91% of age predicted maximum heart rate.  Exercise capacity was low.  No chest pain reported.  Normal heart rate and hemodynamic response. Stress EKG revealed no ischemic changes. Intermediate risk stress test due to poor exercise capacity.   Echocardiogram 10/25/2021: Normal LV systolic function with visual EF 60-65%. Left ventricle cavity is normal in size. Normal global wall motion. Normal diastolic filling pattern, normal LAP. Mild left ventricular hypertrophy. Mild (Grade I) aortic regurgitation. No prior study for comparison.  CT cardiac scoring 11/09/2021: IMPRESSION: 1. Coronary calcium score is 0. 2. Small hiatal  hernia.       Zannie Cove [facility] Short Stay Center/Anesthesiology Phone [phone] 04/12/2023 9:46 AM

## 2023-04-12 NOTE — Anesthesia Preprocedure Evaluation (Addendum)
Anesthesia Evaluation  Patient identified by MRN, date of birth, ID band Patient awake    Reviewed: Allergy & Precautions, H&P , NPO status , Patient's Chart, lab work & pertinent test results  Airway Mallampati: II  TM Distance: >3 FB Neck ROM: Full    Dental  (+) Dental Advisory Given   Pulmonary neg pulmonary ROS   Pulmonary exam normal breath sounds clear to auscultation       Cardiovascular Exercise Tolerance: Good hypertension, Pt. on medications Normal cardiovascular exam Rhythm:Regular Rate:Normal  Exercise treadmill stress test 11/18/2021: Exercise treadmill stress test performed using Bruce protocol.  Patient reached 5.1 METS, and 91% of age predicted maximum heart rate.  Exercise capacity was low.  No chest pain reported.  Normal heart rate and hemodynamic response. Stress EKG revealed no ischemic changes. Intermediate risk stress test due to poor exercise capacity.    Echocardiogram 10/25/2021:  Normal LV systolic function with visual EF 60-65%. Left ventricle cavity  is normal in size. Normal global wall motion. Normal diastolic filling  pattern, normal LAP. Mild left ventricular hypertrophy.  Mild (Grade I) aortic regurgitation.  No prior study for comparison.    Coronary calcium 10/2021  IMPRESSION: 1. Coronary calcium score is 0. 2. Small hiatal hernia.    Neuro/Psych   Anxiety Depression    negative neurological ROS  negative psych ROS   GI/Hepatic negative GI ROS, Neg liver ROS, hiatal hernia,GERD  Medicated,,  Endo/Other  negative endocrine ROSHypothyroidism    Renal/GU negative Renal ROS  negative genitourinary   Musculoskeletal  (+) Arthritis , Osteoarthritis,  Fibromyalgia -  Abdominal  (+) + obese  Peds  Hematology negative hematology ROS (+) Blood dyscrasia, anemia   Anesthesia Other Findings   Reproductive/Obstetrics negative OB ROS                              Anesthesia Physical Anesthesia Plan  ASA: 3  Anesthesia Plan: General   Post-op Pain Management: Regional block*   Induction: Intravenous  PONV Risk Score and Plan: 3 and Ondansetron, Dexamethasone, Midazolam and Treatment may vary due to age or medical condition  Airway Management Planned: Oral ETT  Additional Equipment:   Intra-op Plan:   Post-operative Plan: Extubation in OR  Informed Consent: I have reviewed the patients History and Physical, chart, labs and discussed the procedure including the risks, benefits and alternatives for the proposed anesthesia with the patient or authorized representative who has indicated his/her understanding and acceptance.     Dental advisory given  Plan Discussed with: CRNA  Anesthesia Plan Comments: (PAT note by Antionette Poles, PA-C: 62 year old female with pertinent history including HTN, GERD, HLD, fibromyalgia, hiatal hernia s/p robotic assisted fundoplication 03/12/2023, hypothyroidism.  Prior cardiology evaluation by Dr. Odis Hollingshead in spring 2023 for atypical chest pain. Coronary calcium score noted total CAC of 0, echocardiogram notes preserved LVEF with normal diastolic function and no significant valvular heart disease, and exercise treadmill stress test was reported to be intermediate risk but stress ECG negative for ischemia. The intermediate risk was secondary to reduced exercise capacity.  Last seen 11/25/2021, at that time she was structured to follow-up with PCP for management of chronic conditions, follow-up with cardiology on an as-needed basis.  Last seen by general surgeon Dr. Doylene Canard on 03/29/2023 for follow-up of recent fundoplication.  Per note, doing well, no significant pain, significant improvement in reflux and GERD symptoms.  Preop labs reviewed, unremarkable.  EKG 10/09/2022:  Sinus rhythm.  Rate 89.  Low voltage, precordial leads.  Abnormal R wave progression, early transition.  Borderline T abnormalities,  anterior leads.  Exercise treadmill stress test 11/18/2021: Exercise treadmill stress test performed using Bruce protocol.  Patient reached 5.1 METS, and 91% of age predicted maximum heart rate.  Exercise capacity was low.  No chest pain reported.  Normal heart rate and hemodynamic response. Stress EKG revealed no ischemic changes. Intermediate risk stress test due to poor exercise capacity.   Echocardiogram 10/25/2021: Normal LV systolic function with visual EF 60-65%. Left ventricle cavity is normal in size. Normal global wall motion. Normal diastolic filling pattern, normal LAP. Mild left ventricular hypertrophy. Mild (Grade I) aortic regurgitation. No prior study for comparison.  CT cardiac scoring 11/09/2021: IMPRESSION: 1. Coronary calcium score is 0. 2. Small hiatal hernia.   )       Anesthesia Quick Evaluation

## 2023-04-16 ENCOUNTER — Observation Stay (HOSPITAL_COMMUNITY): Payer: Medicaid Other

## 2023-04-16 ENCOUNTER — Ambulatory Visit (HOSPITAL_BASED_OUTPATIENT_CLINIC_OR_DEPARTMENT_OTHER): Payer: Medicaid Other | Admitting: Anesthesiology

## 2023-04-16 ENCOUNTER — Other Ambulatory Visit: Payer: Self-pay

## 2023-04-16 ENCOUNTER — Encounter (HOSPITAL_COMMUNITY)
Admission: RE | Disposition: A | Discharge status: Home / Self Care | Payer: Self-pay | Source: Home / Self Care | Attending: Orthopedic Surgery

## 2023-04-16 ENCOUNTER — Other Ambulatory Visit: Payer: Self-pay | Admitting: Family Medicine

## 2023-04-16 ENCOUNTER — Ambulatory Visit (HOSPITAL_COMMUNITY): Payer: Medicaid Other | Admitting: Physician Assistant

## 2023-04-16 ENCOUNTER — Observation Stay (HOSPITAL_COMMUNITY)
Admission: RE | Admit: 2023-04-16 | Discharge: 2023-04-17 | Disposition: A | Discharge status: Home / Self Care | Payer: Medicaid Other | Attending: Orthopedic Surgery | Admitting: Orthopedic Surgery

## 2023-04-16 DIAGNOSIS — E039 Hypothyroidism, unspecified: Secondary | ICD-10-CM | POA: Diagnosis not present

## 2023-04-16 DIAGNOSIS — Z79899 Other long term (current) drug therapy: Secondary | ICD-10-CM | POA: Insufficient documentation

## 2023-04-16 DIAGNOSIS — Z85820 Personal history of malignant melanoma of skin: Secondary | ICD-10-CM | POA: Diagnosis not present

## 2023-04-16 DIAGNOSIS — Z96611 Presence of right artificial shoulder joint: Secondary | ICD-10-CM | POA: Diagnosis not present

## 2023-04-16 DIAGNOSIS — Z96653 Presence of artificial knee joint, bilateral: Secondary | ICD-10-CM | POA: Insufficient documentation

## 2023-04-16 DIAGNOSIS — M7522 Bicipital tendinitis, left shoulder: Secondary | ICD-10-CM

## 2023-04-16 DIAGNOSIS — Z96612 Presence of left artificial shoulder joint: Secondary | ICD-10-CM

## 2023-04-16 DIAGNOSIS — M19012 Primary osteoarthritis, left shoulder: Principal | ICD-10-CM

## 2023-04-16 DIAGNOSIS — Z7722 Contact with and (suspected) exposure to environmental tobacco smoke (acute) (chronic): Secondary | ICD-10-CM | POA: Insufficient documentation

## 2023-04-16 DIAGNOSIS — Z01818 Encounter for other preprocedural examination: Principal | ICD-10-CM

## 2023-04-16 DIAGNOSIS — M75121 Complete rotator cuff tear or rupture of right shoulder, not specified as traumatic: Secondary | ICD-10-CM

## 2023-04-16 DIAGNOSIS — I10 Essential (primary) hypertension: Secondary | ICD-10-CM | POA: Diagnosis not present

## 2023-04-16 HISTORY — PX: REVERSE SHOULDER ARTHROPLASTY: SHX5054

## 2023-04-16 SURGERY — ARTHROPLASTY, SHOULDER, TOTAL, REVERSE
Anesthesia: General | Site: Shoulder | Laterality: Left

## 2023-04-16 MED ORDER — ACETAMINOPHEN 500 MG PO TABS
1000.0000 mg | ORAL_TABLET | Freq: Once | ORAL | Status: AC
  Administered 2023-04-16: 1000 mg via ORAL
  Filled 2023-04-16: qty 2

## 2023-04-16 MED ORDER — LISINOPRIL-HYDROCHLOROTHIAZIDE 10-12.5 MG PO TABS: 1.0000 | ORAL_TABLET | Freq: Every day | ORAL | Status: DC

## 2023-04-16 MED ORDER — VANCOMYCIN HCL 1000 MG IV SOLR
INTRAVENOUS | Status: AC
  Filled 2023-04-16: qty 20

## 2023-04-16 MED ORDER — IRRISEPT - 450ML BOTTLE WITH 0.05% CHG IN STERILE WATER, USP 99.95% OPTIME
TOPICAL | Status: DC | PRN
  Administered 2023-04-16: 450 mL via TOPICAL

## 2023-04-16 MED ORDER — AMISULPRIDE (ANTIEMETIC) 5 MG/2ML IV SOLN: 10.0000 mg | Freq: Once | INTRAVENOUS | Status: DC | PRN

## 2023-04-16 MED ORDER — CEFAZOLIN SODIUM-DEXTROSE 2-4 GM/100ML-% IV SOLN
2.0000 g | INTRAVENOUS | Status: AC
  Administered 2023-04-16: 2 g via INTRAVENOUS
  Filled 2023-04-16: qty 100

## 2023-04-16 MED ORDER — ONDANSETRON HCL 4 MG/2ML IJ SOLN: 4.0000 mg | Freq: Four times a day (QID) | INTRAMUSCULAR | Status: DC | PRN

## 2023-04-16 MED ORDER — ROCURONIUM BROMIDE 10 MG/ML (PF) SYRINGE
PREFILLED_SYRINGE | INTRAVENOUS | Status: DC | PRN
  Administered 2023-04-16: 80 mg via INTRAVENOUS

## 2023-04-16 MED ORDER — PANTOPRAZOLE SODIUM 40 MG PO TBEC
40.0000 mg | DELAYED_RELEASE_TABLET | Freq: Every day | ORAL | Status: DC
  Administered 2023-04-16: 40 mg via ORAL
  Filled 2023-04-16: qty 1

## 2023-04-16 MED ORDER — ALBUMIN HUMAN 5 % IV SOLN: INTRAVENOUS | Status: DC | PRN

## 2023-04-16 MED ORDER — FENTANYL CITRATE (PF) 250 MCG/5ML IJ SOLN
INTRAMUSCULAR | Status: AC
  Filled 2023-04-16: qty 5

## 2023-04-16 MED ORDER — HYDROCHLOROTHIAZIDE 12.5 MG PO TABS
12.5000 mg | ORAL_TABLET | Freq: Every day | ORAL | Status: DC
  Administered 2023-04-16: 12.5 mg via ORAL
  Filled 2023-04-16: qty 1

## 2023-04-16 MED ORDER — MIDAZOLAM HCL 2 MG/2ML IJ SOLN
INTRAMUSCULAR | Status: DC | PRN
  Administered 2023-04-16: 2 mg via INTRAVENOUS

## 2023-04-16 MED ORDER — BUPIVACAINE LIPOSOME 1.3 % IJ SUSP
INTRAMUSCULAR | Status: DC | PRN
Start: 2023-04-16 — End: 2023-04-16
  Administered 2023-04-16: 10 mL via PERINEURAL

## 2023-04-16 MED ORDER — ROCURONIUM BROMIDE 10 MG/ML (PF) SYRINGE
PREFILLED_SYRINGE | INTRAVENOUS | Status: AC
  Filled 2023-04-16: qty 10

## 2023-04-16 MED ORDER — ONDANSETRON HCL 4 MG PO TABS: 4.0000 mg | ORAL_TABLET | Freq: Four times a day (QID) | ORAL | Status: DC | PRN

## 2023-04-16 MED ORDER — METHOCARBAMOL 1000 MG/10ML IJ SOLN: 500.0000 mg | Freq: Four times a day (QID) | INTRAMUSCULAR | Status: DC | PRN

## 2023-04-16 MED ORDER — CELECOXIB 100 MG PO CAPS
100.0000 mg | ORAL_CAPSULE | Freq: Two times a day (BID) | ORAL | Status: DC
  Administered 2023-04-17: 100 mg via ORAL
  Filled 2023-04-16: qty 1

## 2023-04-16 MED ORDER — SODIUM CHLORIDE 0.9 % IV SOLN: INTRAVENOUS | Status: DC | PRN

## 2023-04-16 MED ORDER — LIDOCAINE 2% (20 MG/ML) 5 ML SYRINGE
INTRAMUSCULAR | Status: DC | PRN
  Administered 2023-04-16: 60 mg via INTRAVENOUS

## 2023-04-16 MED ORDER — ONDANSETRON HCL 4 MG/2ML IJ SOLN
INTRAMUSCULAR | Status: DC | PRN
  Administered 2023-04-16: 4 mg via INTRAVENOUS

## 2023-04-16 MED ORDER — CHLORHEXIDINE GLUCONATE 0.12 % MT SOLN
OROMUCOSAL | Status: AC
  Administered 2023-04-16: 15 mL
  Filled 2023-04-16: qty 15

## 2023-04-16 MED ORDER — OXYCODONE HCL 5 MG/5ML PO SOLN: 5.0000 mg | Freq: Once | ORAL | Status: DC | PRN

## 2023-04-16 MED ORDER — POVIDONE-IODINE 7.5 % EX SOLN
Freq: Once | CUTANEOUS | Status: DC
  Filled 2023-04-16: qty 118

## 2023-04-16 MED ORDER — HYDROMORPHONE HCL 1 MG/ML IJ SOLN: 0.2500 mg | INTRAMUSCULAR | Status: DC | PRN

## 2023-04-16 MED ORDER — PROPOFOL 10 MG/ML IV BOLUS
INTRAVENOUS | Status: AC
  Filled 2023-04-16: qty 20

## 2023-04-16 MED ORDER — ONDANSETRON HCL 4 MG/2ML IJ SOLN
INTRAMUSCULAR | Status: AC
  Filled 2023-04-16: qty 2

## 2023-04-16 MED ORDER — CEFAZOLIN SODIUM-DEXTROSE 2-4 GM/100ML-% IV SOLN
2.0000 g | Freq: Three times a day (TID) | INTRAVENOUS | Status: AC
  Administered 2023-04-16 (×2): 2 g via INTRAVENOUS
  Filled 2023-04-16 (×2): qty 100

## 2023-04-16 MED ORDER — VANCOMYCIN HCL 1000 MG IV SOLR
INTRAVENOUS | Status: DC | PRN
  Administered 2023-04-16: 1000 mg via TOPICAL

## 2023-04-16 MED ORDER — DIPHENHYDRAMINE HCL 25 MG PO CAPS: 25.0000 mg | ORAL_CAPSULE | Freq: Every evening | ORAL | Status: DC | PRN

## 2023-04-16 MED ORDER — SUGAMMADEX SODIUM 200 MG/2ML IV SOLN
INTRAVENOUS | Status: DC | PRN
  Administered 2023-04-16 (×2): 100 mg via INTRAVENOUS

## 2023-04-16 MED ORDER — ESCITALOPRAM OXALATE 20 MG PO TABS
20.0000 mg | ORAL_TABLET | Freq: Every day | ORAL | Status: DC
  Administered 2023-04-17: 20 mg via ORAL
  Filled 2023-04-16: qty 1

## 2023-04-16 MED ORDER — METOCLOPRAMIDE HCL 5 MG PO TABS: 5.0000 mg | ORAL_TABLET | Freq: Three times a day (TID) | ORAL | Status: DC | PRN

## 2023-04-16 MED ORDER — HYDROMORPHONE HCL 1 MG/ML IJ SOLN: 0.5000 mg | INTRAMUSCULAR | Status: DC | PRN

## 2023-04-16 MED ORDER — METHOCARBAMOL 500 MG PO TABS
500.0000 mg | ORAL_TABLET | Freq: Four times a day (QID) | ORAL | Status: DC | PRN
  Administered 2023-04-16 – 2023-04-17 (×2): 500 mg via ORAL
  Filled 2023-04-16 (×2): qty 1

## 2023-04-16 MED ORDER — PHENOL 1.4 % MT LIQD: 1.0000 | OROMUCOSAL | Status: DC | PRN

## 2023-04-16 MED ORDER — LISINOPRIL 10 MG PO TABS
10.0000 mg | ORAL_TABLET | Freq: Every day | ORAL | Status: DC
  Administered 2023-04-16: 10 mg via ORAL
  Filled 2023-04-16: qty 1

## 2023-04-16 MED ORDER — DEXAMETHASONE SODIUM PHOSPHATE 10 MG/ML IJ SOLN
INTRAMUSCULAR | Status: DC | PRN
  Administered 2023-04-16: 5 mg via INTRAVENOUS

## 2023-04-16 MED ORDER — DICYCLOMINE HCL 10 MG PO CAPS: 10.0000 mg | ORAL_CAPSULE | Freq: Four times a day (QID) | ORAL | Status: DC | PRN

## 2023-04-16 MED ORDER — PROPOFOL 10 MG/ML IV BOLUS
INTRAVENOUS | Status: DC | PRN
  Administered 2023-04-16: 150 mg via INTRAVENOUS

## 2023-04-16 MED ORDER — BUPIVACAINE HCL (PF) 0.5 % IJ SOLN
INTRAMUSCULAR | Status: DC | PRN
Start: 2023-04-16 — End: 2023-04-16
  Administered 2023-04-16: 20 mL via PERINEURAL

## 2023-04-16 MED ORDER — MIDAZOLAM HCL 2 MG/2ML IJ SOLN
INTRAMUSCULAR | Status: AC
  Filled 2023-04-16: qty 2

## 2023-04-16 MED ORDER — PHENYLEPHRINE HCL-NACL 20-0.9 MG/250ML-% IV SOLN
INTRAVENOUS | Status: DC | PRN
  Administered 2023-04-16: 10 ug/min via INTRAVENOUS

## 2023-04-16 MED ORDER — LIDOCAINE 2% (20 MG/ML) 5 ML SYRINGE
INTRAMUSCULAR | Status: AC
  Filled 2023-04-16: qty 5

## 2023-04-16 MED ORDER — SODIUM CHLORIDE 0.9 % IV SOLN: 12.5000 mg | INTRAVENOUS | Status: DC | PRN

## 2023-04-16 MED ORDER — QUETIAPINE FUMARATE 50 MG PO TABS
50.0000 mg | ORAL_TABLET | Freq: Once | ORAL | Status: AC
  Administered 2023-04-16: 50 mg via ORAL
  Filled 2023-04-16 (×2): qty 1

## 2023-04-16 MED ORDER — 0.9 % SODIUM CHLORIDE (POUR BTL) OPTIME
TOPICAL | Status: DC | PRN
  Administered 2023-04-16: 1000 mL

## 2023-04-16 MED ORDER — DOCUSATE SODIUM 100 MG PO CAPS
100.0000 mg | ORAL_CAPSULE | Freq: Two times a day (BID) | ORAL | Status: DC
  Administered 2023-04-16 (×2): 100 mg via ORAL
  Filled 2023-04-16 (×2): qty 1

## 2023-04-16 MED ORDER — LEVOTHYROXINE SODIUM 50 MCG PO TABS
50.0000 ug | ORAL_TABLET | Freq: Every day | ORAL | Status: DC
  Administered 2023-04-17: 50 ug via ORAL
  Filled 2023-04-16: qty 2
  Filled 2023-04-16: qty 1

## 2023-04-16 MED ORDER — DEXAMETHASONE SODIUM PHOSPHATE 10 MG/ML IJ SOLN
INTRAMUSCULAR | Status: AC
  Filled 2023-04-16: qty 1

## 2023-04-16 MED ORDER — POVIDONE-IODINE 10 % EX SWAB
2.0000 | Freq: Once | CUTANEOUS | Status: AC
  Administered 2023-04-16: 2 via TOPICAL

## 2023-04-16 MED ORDER — FENTANYL CITRATE (PF) 250 MCG/5ML IJ SOLN
INTRAMUSCULAR | Status: DC | PRN
  Administered 2023-04-16: 50 ug via INTRAVENOUS
  Administered 2023-04-16: 100 ug via INTRAVENOUS

## 2023-04-16 MED ORDER — METOCLOPRAMIDE HCL 5 MG/ML IJ SOLN: 5.0000 mg | Freq: Three times a day (TID) | INTRAMUSCULAR | Status: DC | PRN

## 2023-04-16 MED ORDER — PHENYLEPHRINE 80 MCG/ML (10ML) SYRINGE FOR IV PUSH (FOR BLOOD PRESSURE SUPPORT)
PREFILLED_SYRINGE | INTRAVENOUS | Status: AC
  Filled 2023-04-16: qty 10

## 2023-04-16 MED ORDER — PREGABALIN 75 MG PO CAPS
75.0000 mg | ORAL_CAPSULE | Freq: Two times a day (BID) | ORAL | Status: DC
  Administered 2023-04-16 – 2023-04-17 (×2): 75 mg via ORAL
  Filled 2023-04-16 (×2): qty 1

## 2023-04-16 MED ORDER — TRANEXAMIC ACID-NACL 1000-0.7 MG/100ML-% IV SOLN
1000.0000 mg | INTRAVENOUS | Status: AC
  Administered 2023-04-16: 1000 mg via INTRAVENOUS
  Filled 2023-04-16: qty 100

## 2023-04-16 MED ORDER — OXYCODONE HCL 5 MG PO TABS
5.0000 mg | ORAL_TABLET | ORAL | Status: DC | PRN
  Administered 2023-04-16 – 2023-04-17 (×2): 5 mg via ORAL
  Filled 2023-04-16 (×2): qty 1

## 2023-04-16 MED ORDER — ACETAMINOPHEN 325 MG PO TABS: 325.0000 mg | ORAL_TABLET | Freq: Four times a day (QID) | ORAL | Status: DC | PRN

## 2023-04-16 MED ORDER — OXYCODONE HCL 5 MG PO TABS: 5.0000 mg | ORAL_TABLET | Freq: Once | ORAL | Status: DC | PRN

## 2023-04-16 MED ORDER — ACETAMINOPHEN 500 MG PO TABS
1000.0000 mg | ORAL_TABLET | Freq: Four times a day (QID) | ORAL | Status: AC
  Administered 2023-04-16 – 2023-04-17 (×4): 1000 mg via ORAL
  Filled 2023-04-16 (×4): qty 2

## 2023-04-16 MED ORDER — ASPIRIN 81 MG PO TBEC
81.0000 mg | DELAYED_RELEASE_TABLET | Freq: Every day | ORAL | Status: DC
  Administered 2023-04-16: 81 mg via ORAL
  Filled 2023-04-16: qty 1

## 2023-04-16 MED ORDER — EPHEDRINE SULFATE-NACL 50-0.9 MG/10ML-% IV SOSY
PREFILLED_SYRINGE | INTRAVENOUS | Status: DC | PRN
  Administered 2023-04-16 (×4): 5 mg via INTRAVENOUS

## 2023-04-16 MED ORDER — MEPERIDINE HCL 25 MG/ML IJ SOLN: 6.2500 mg | INTRAMUSCULAR | Status: DC | PRN

## 2023-04-16 MED ORDER — MENTHOL 3 MG MT LOZG: 1.0000 | LOZENGE | OROMUCOSAL | Status: DC | PRN

## 2023-04-16 SURGICAL SUPPLY — 76 items
AID PSTN UNV HD RSTRNT DISP (MISCELLANEOUS) ×1
ALCOHOL 70% 16 OZ (MISCELLANEOUS) ×2 IMPLANT
APL PRP STRL LF DISP 70% ISPRP (MISCELLANEOUS) ×2
AUG COMP REV MI TAPER ADAPTER (Joint) ×1 IMPLANT
AUGMENT COMP REV MI TAPR ADPTR (Joint) IMPLANT
BAG COUNTER SPONGE SURGICOUNT (BAG) ×2 IMPLANT
BAG SPNG CNTER NS LX DISP (BAG) ×1
BEARING HUMERAL SHLDER 36M STD (Shoulder) IMPLANT
BIT DRILL 2.7 W/STOP DISP (BIT) IMPLANT
BIT DRILL TWIST 2.7 (BIT) IMPLANT
BLADE SAW SGTL 13X75X1.27 (BLADE) ×2 IMPLANT
BRNG HUM STD 36 RVRS SHLDR (Shoulder) ×1 IMPLANT
BSPLAT GLND SM AUG TPR ADPR (Joint) ×1 IMPLANT
CHLORAPREP W/TINT 26 (MISCELLANEOUS) ×2 IMPLANT
COOLER ICEMAN CLASSIC (MISCELLANEOUS) ×2 IMPLANT
COVER SURGICAL LIGHT HANDLE (MISCELLANEOUS) ×2 IMPLANT
DRAPE INCISE IOBAN 66X45 STRL (DRAPES) ×2 IMPLANT
DRAPE U-SHAPE 47X51 STRL (DRAPES) ×4 IMPLANT
DRSG AQUACEL AG ADV 3.5X10 (GAUZE/BANDAGES/DRESSINGS) ×2 IMPLANT
ELECT BLADE 4.0 EZ CLEAN MEGAD (MISCELLANEOUS) ×1
ELECT REM PT RETURN 9FT ADLT (ELECTROSURGICAL) ×1
ELECTRODE BLDE 4.0 EZ CLN MEGD (MISCELLANEOUS) ×2 IMPLANT
ELECTRODE REM PT RTRN 9FT ADLT (ELECTROSURGICAL) ×2 IMPLANT
GAUZE SPONGE 4X4 12PLY STRL LF (GAUZE/BANDAGES/DRESSINGS) ×2 IMPLANT
GLENOID SPHERE STD STRL 36MM (Orthopedic Implant) IMPLANT
GLOVE BIOGEL PI IND STRL 6.5 (GLOVE) ×2 IMPLANT
GLOVE BIOGEL PI IND STRL 8 (GLOVE) ×2 IMPLANT
GLOVE ECLIPSE 6.5 STRL STRAW (GLOVE) ×2 IMPLANT
GLOVE ECLIPSE 8.0 STRL XLNG CF (GLOVE) ×2 IMPLANT
GOWN STRL REUS W/ TWL LRG LVL3 (GOWN DISPOSABLE) ×2 IMPLANT
GOWN STRL REUS W/ TWL XL LVL3 (GOWN DISPOSABLE) ×2 IMPLANT
GOWN STRL REUS W/TWL LRG LVL3 (GOWN DISPOSABLE) ×1
GOWN STRL REUS W/TWL XL LVL3 (GOWN DISPOSABLE) ×1
GUIDE RSA SHLD BM ROT L SU (ORTHOPEDIC DISPOSABLE SUPPLIES) IMPLANT
HYDROGEN PEROXIDE 16OZ (MISCELLANEOUS) ×2 IMPLANT
JET LAVAGE IRRISEPT WOUND (IRRIGATION / IRRIGATOR) ×1
KIT BASIN OR (CUSTOM PROCEDURE TRAY) ×2 IMPLANT
KIT TURNOVER KIT B (KITS) ×2 IMPLANT
LAVAGE JET IRRISEPT WOUND (IRRIGATION / IRRIGATOR) ×2 IMPLANT
MANIFOLD NEPTUNE II (INSTRUMENTS) ×2 IMPLANT
NDL SUT 6 .5 CRC .975X.05 MAYO (NEEDLE) IMPLANT
NEEDLE MAYO TAPER (NEEDLE) ×1
NS IRRIG 1000ML POUR BTL (IV SOLUTION) ×2 IMPLANT
PACK SHOULDER (CUSTOM PROCEDURE TRAY) ×2 IMPLANT
PAD COLD SHLDR WRAP-ON (PAD) ×2 IMPLANT
PIN STEINMANN THREADED TIP (PIN) IMPLANT
PIN THREADED REVERSE (PIN) IMPLANT
REAMER GUIDE BUSHING SURG DISP (MISCELLANEOUS) IMPLANT
REAMER GUIDE W/SCREW AUG (MISCELLANEOUS) IMPLANT
RESTRAINT HEAD UNIVERSAL NS (MISCELLANEOUS) ×2 IMPLANT
RETRIEVER SUT HEWSON (MISCELLANEOUS) ×2 IMPLANT
SCREW BONE LOCKING 4.75X30X3.5 (Screw) IMPLANT
SCREW CENTRAL 6.5X20MM (Screw) IMPLANT
SCREW LOCKING 4.75MMX15MM (Screw) IMPLANT
SCREW LOCKING NS 4.75MMX20MM (Screw) IMPLANT
SHOULDER HUMERAL BEAR 36M STD (Shoulder) ×1 IMPLANT
SLING ARM IMMOBILIZER LRG (SOFTGOODS) ×2 IMPLANT
SLING ARM IMMOBILIZER MED (SOFTGOODS) IMPLANT
SOL PREP POV-IOD 4OZ 10% (MISCELLANEOUS) ×2 IMPLANT
SPONGE T-LAP 18X18 ~~LOC~~+RFID (SPONGE) ×2 IMPLANT
STEM HUMERAL STRL 8MMX83MM (Stem) IMPLANT
STRIP CLOSURE SKIN 1/2X4 (GAUZE/BANDAGES/DRESSINGS) ×2 IMPLANT
SUCTION TUBE FRAZIER 10FR DISP (SUCTIONS) ×2 IMPLANT
SUT BROADBAND TAPE 2PK 1.5 (SUTURE) IMPLANT
SUT MNCRL AB 3-0 PS2 18 (SUTURE) ×2 IMPLANT
SUT SILK 2 0 TIES 10X30 (SUTURE) ×2 IMPLANT
SUT VIC AB 0 CT1 27 (SUTURE) ×2
SUT VIC AB 0 CT1 27XBRD ANBCTR (SUTURE) ×8 IMPLANT
SUT VIC AB 1 CT1 27 (SUTURE) ×5
SUT VIC AB 1 CT1 27XBRD ANBCTR (SUTURE) ×4 IMPLANT
SUT VIC AB 2-0 CT1 27 (SUTURE) ×3
SUT VIC AB 2-0 CT1 TAPERPNT 27 (SUTURE) ×6 IMPLANT
SUT VICRYL 0 UR6 27IN ABS (SUTURE) ×4 IMPLANT
TOWEL GREEN STERILE (TOWEL DISPOSABLE) ×2 IMPLANT
TRAY HUM REV SHOULDER STD +6 (Shoulder) IMPLANT
WATER STERILE IRR 1000ML POUR (IV SOLUTION) ×2 IMPLANT

## 2023-04-16 NOTE — Transfer of Care (Signed)
Immediate Anesthesia Transfer of Care Note  Patient: Denise Morales  Procedure(s) Performed: REVERSE SHOULDER ARTHROPLASTY (Left: Shoulder)  Patient Location: PACU  Anesthesia Type:General  Level of Consciousness: drowsy  Airway & Oxygen Therapy: Patient Spontanous Breathing and Patient connected to face mask oxygen  Post-op Assessment: Report given to RN and Post -op Vital signs reviewed and stable  Post vital signs: Reviewed and stable  Last Vitals:  Vitals Value Taken Time  BP 122/69 04/16/23 1200  Temp 36 C 04/16/23 1115  Pulse 86 04/16/23 1212  Resp 17 04/16/23 1212  SpO2 94 % 04/16/23 1212  Vitals shown include unfiled device data.  Last Pain:  Vitals:   04/16/23 1145  PainSc: 0-No pain         Complications: No notable events documented.

## 2023-04-16 NOTE — Anesthesia Postprocedure Evaluation (Signed)
Anesthesia Post Note  Patient: Denise Morales  Procedure(s) Performed: REVERSE SHOULDER ARTHROPLASTY (Left: Shoulder)     Patient location during evaluation: PACU Anesthesia Type: General Level of consciousness: awake and alert Pain management: pain level controlled Vital Signs Assessment: post-procedure vital signs reviewed and stable Respiratory status: spontaneous breathing, nonlabored ventilation and respiratory function stable Cardiovascular status: blood pressure returned to baseline and stable Postop Assessment: no apparent nausea or vomiting Anesthetic complications: no   No notable events documented.  Last Vitals:  Vitals:   04/16/23 1200 04/16/23 1215  BP: 122/69 119/82  Pulse: 88 84  Resp: 19 16  Temp:  36.5 C  SpO2: 92% 94%    Last Pain:  Vitals:   04/16/23 1215  PainSc: 0-No pain                 Lowella Curb

## 2023-04-16 NOTE — H&P (Signed)
Denise Morales is an 62 y.o. female.   Chief Complaint: left shoulder pain HPI: Denise Morales is a 62 year old patient with left shoulder pain.  She is doing reasonably well from her right reverse shoulder replacement done several months ago.  She reports night pain and rest pain as well as pain consistent with her known diagnosis of severe glenohumeral joint arthritis.  She has failed a long course of conservative treatment measures. Preop CT scanning for patient specific instrumentation demonstrates severe glenohumeral joint arthritis with deformity as well as supraspinatus rotator cuff pathology.  Past Medical History:  Diagnosis Date   Allergy    Anemia    Anxiety    Arthritis    Depression    Fibromyalgia    GERD (gastroesophageal reflux disease)    Heart murmur    History of hiatal hernia    Hyperlipidemia    Hypertension    Hypothyroidism    Melanoma [facility])    face    Past Surgical History:  Procedure Laterality Date   ABDOMINAL HYSTERECTOMY  1985   AIKEN OSTEOTOMY Right 06/05/2022   Procedure: Quintella Reichert OSTEOTOMY;  Surgeon: Candelaria Stagers, DPM;  Location: [facility] [place];  Service: Podiatry;  Laterality: Right;   BICEPT TENODESIS Right 12/05/2022   Procedure: RIGHT BICEPS TENODESIS;  Surgeon: Cammy Copa, MD;  Location: [facility] OR;  Service: Orthopedics;  Laterality: Right;   CAPSULOTOMY METATARSOPHALANGEAL Right 06/05/2022   Procedure: CAPSULOTOMY METATARSOPHALANGEAL;  Surgeon: Candelaria Stagers, DPM;  Location: [facility] [place];  Service: Podiatry;  Laterality: Right;   CHOLECYSTECTOMY     COLONOSCOPY     HALLUX VALGUS LAPIDUS Right 06/05/2022   Procedure: HALLUX VALGUS LAPIDUS;  Surgeon: Candelaria Stagers, DPM;  Location: [facility];  Service: Podiatry;  Laterality: Right;   HAMMER TOE SURGERY Right 06/05/2022   Procedure: HAMMER TOE CORRECTION;  Surgeon: Candelaria Stagers, DPM;  Location: [facility];  Service: Podiatry;  Laterality:  Right;   REVERSE SHOULDER ARTHROPLASTY Right 12/05/2022   Procedure: RIGHT REVERSE SHOULDER ARTHROPLASTY;  Surgeon: Cammy Copa, MD;  Location: [facility] OR;  Service: Orthopedics;  Laterality: Right;   SHOULDER ARTHROSCOPY WITH ROTATOR CUFF REPAIR AND SUBACROMIAL DECOMPRESSION Right 08/12/2020   Procedure: RIGHT SHOULDER ARTHROSCOPY WITH ROTATOR CUFF REPAIR AND SUBACROMIAL DECOMPRESSION;  Surgeon: Kathryne Hitch, MD;  Location: [facility];  Service: Orthopedics;  Laterality: Right;   TOTAL KNEE ARTHROPLASTY Right 12/24/2020   Procedure: RIGHT TOTAL KNEE ARTHROPLASTY;  Surgeon: Kathryne Hitch, MD;  Location: [facility];  Service: Orthopedics;  Laterality: Right;   TOTAL KNEE ARTHROPLASTY Left 07/15/2021   Procedure: LEFT TOTAL KNEE ARTHROPLASTY;  Surgeon: Kathryne Hitch, MD;  Location: [facility];  Service: Orthopedics;  Laterality: Left;   XI ROBOTIC ASSISTED PARAESOPHAGEAL HERNIA REPAIR N/A 03/12/2023   Procedure: XI ROBOTIC ASSISTED PARAESOPHAGEAL HERNIA REPAIR WITH FUNDOPLICATION;  Surgeon: Berna Bue, MD;  Location: [facility];  Service: General;  Laterality: N/A;  180    Family History  Problem Relation Age of Onset   Dementia Mother    Heart Problems Mother    Stroke Mother    Hypertension Mother    Hypertension Father    Colon polyps Father    Diabetes Father    Hypertension Sister    Diabetes Sister        borderline   Fibromyalgia Sister    Hypertension Sister    Colon polyps Sister    Diabetes Sister  Fibromyalgia Sister    Heart Problems Sister    Arthritis Sister    Hypertension Sister    Heart Problems Sister    Healthy Daughter    Diabetes Paternal Aunt    Colon cancer Neg Hx    Esophageal cancer Neg Hx    Rectal cancer Neg Hx    Stomach cancer Neg Hx    Breast cancer Neg Hx    Social History:  reports that she has never smoked. She has been exposed to tobacco smoke. She has never used smokeless tobacco. She reports that  she does not currently use alcohol. She reports that she does not use drugs.  Allergies:  Allergies  Allergen Reactions   Hydroxyzine Other (See Comments)    hallucinations    Medications Prior to Admission  Medication Sig Dispense Refill   acetaminophen (TYLENOL) 500 MG tablet Take 500-1,000 mg by mouth every 6 (six) hours as needed (pain).     celecoxib (CELEBREX) 200 MG capsule Take 1 capsule (200 mg total) by mouth 2 (two) times daily between meals as needed. (Patient taking differently: Take 200 mg by mouth 2 (two) times daily.) 60 capsule 3   dicyclomine (BENTYL) 10 MG capsule Take 10 mg by mouth 4 (four) times daily as needed for spasms.     diphenhydrAMINE (BENADRYL) 25 mg capsule Take 25 mg by mouth at bedtime as needed for allergies.     Iron, Ferrous Sulfate, 325 (65 Fe) MG TABS Take 325 mg by mouth daily. (Patient taking differently: Take 325 mg by mouth every other day.) 90 tablet 1   Krill Oil 500 MG CAPS Take 500 mg by mouth in the morning.     levothyroxine (SYNTHROID) 50 MCG tablet Take 1 tablet (50 mcg total) by mouth daily. 90 tablet 3   lisinopril-hydrochlorothiazide (ZESTORETIC) 10-12.5 MG tablet Take 1 tablet by mouth daily. 90 tablet 1   pantoprazole (PROTONIX) 40 MG tablet TAKE 1 TABLET BY MOUTH TWICE DAILY BEFORE A MEAL (Patient taking differently: Take 40 mg by mouth daily.) 60 tablet 2   pravastatin (PRAVACHOL) 40 MG tablet Take 1 tablet (40 mg total) by mouth daily. (Patient taking differently: Take 40 mg by mouth at bedtime.) 90 tablet 1   pregabalin (LYRICA) 75 MG capsule Take 1 capsule (75 mg total) by mouth 2 (two) times daily. 120 capsule 0   TURMERIC PO Take 1,000 mg by mouth in the morning.     Vitamin D-Vitamin K (VITAMIN K2-VITAMIN D3 PO) Take 1 tablet by mouth in the morning. 100 mg/50 mcg     zolpidem (AMBIEN) 5 MG tablet TAKE 1 TABLET BY MOUTH EVERY OTHER NIGHT. ALTERNATING WITH QUETIAPINE 15 tablet 0   escitalopram (LEXAPRO) 20 MG tablet Take 1 tablet  (20 mg total) by mouth daily. 90 tablet 1   QUEtiapine (SEROQUEL) 50 MG tablet TAKE 1 TABLET BY MOUTH AT BEDTIME ON MONDAYS, WEDNESDAYS , FRIDAYS AND  SATURDAYS 12 tablet 0   sucralfate (CARAFATE) 1 g tablet TAKE 1 TABLET BY MOUTH THREE TIMES DAILY AS NEEDED FOR HEART BURN 90 tablet 1    No results found for this or any previous visit (from the past 48 hour(s)). No results found.  Review of Systems  Musculoskeletal:  Positive for arthralgias.  All other systems reviewed and are negative.   Blood pressure (!) 146/90, pulse 72, temperature 97.6 F (36.4 C), resp. rate 18, height 4\' 11"  (1.499 m), weight 78 kg, SpO2 97%. Physical Exam Vitals reviewed.  HENT:  Head: Normocephalic.     Nose: Nose normal.     Mouth/Throat:     Mouth: Mucous membranes are moist.  Eyes:     Pupils: Pupils are equal, round, and reactive to light.  Cardiovascular:     Rate and Rhythm: Normal rate.     Pulses: Normal pulses.  Pulmonary:     Effort: Pulmonary effort is normal.  Abdominal:     General: Abdomen is flat.  Musculoskeletal:     Cervical back: Normal range of motion.  Skin:    General: Skin is warm.     Capillary Refill: Capillary refill takes less than 2 seconds.  Neurological:     General: No focal deficit present.     Mental Status: She is alert.  Psychiatric:        Mood and Affect: Mood normal.   Ortho exam on the left demonstrates diminished range of motion actively and passively.  Deltoid does fire.  Subscap strength intact.  Supraspinatus strength and external rotation strength slightly weaker on the left.  Motor or sensory function to the hand is intact.  No discrete AC joint tenderness is present.  Assessment/Plan Impression is severe left shoulder arthritis with limitation of motion and pain.  Patient is doing reasonly well from her right reverse shoulder replacement.  She would like to have left reverse shoulder replacement.  The risk and benefits of the surgery are  discussed with the patient including not limited to infection nerve and vessel damage incomplete functional restoration as well as incomplete pain relief.  The nature of the rehabilitative process is well-known to her from the right shoulder.  Patient understands the risk and benefits and wishes to proceed.  All questions answered  Burnard Bunting, MD 04/16/2023, 6:46 AM

## 2023-04-16 NOTE — Brief Op Note (Signed)
   04/16/2023  10:51 AM  PATIENT:  Denise Morales  62 y.o. female  PRE-OPERATIVE DIAGNOSIS:  left shoulder osteoarthritis  POST-OPERATIVE DIAGNOSIS:  left shoulder osteoarthritis  PROCEDURE:  Procedure(s): REVERSE SHOULDER ARTHROPLASTY  SURGEON:  Surgeon(s): August Saucer Corrie Mckusick, MD  ASSISTANT: magnant pa  ANESTHESIA:   general  EBL: 100 ml    No intake/output data recorded.  BLOOD ADMINISTERED: none  DRAINS: none   LOCAL MEDICATIONS USED:  vanco,  SPECIMEN:  No Specimen  COUNTS:  YES  TOURNIQUET:  * No tourniquets in log *  DICTATION: .Other Dictation: Dictation Number [id number]  PLAN OF CARE: Admit for overnight observation  PATIENT DISPOSITION:  PACU - hemodynamically stable

## 2023-04-16 NOTE — Anesthesia Procedure Notes (Signed)
Procedure Name: Intubation Date/Time: 04/16/2023 7:57 AM  Performed by: Camillia Herter, CRNAPre-anesthesia Checklist: Patient identified, Emergency Drugs available, Suction available and Patient being monitored Patient Re-evaluated:Patient Re-evaluated prior to induction Oxygen Delivery Method: Circle System Utilized Preoxygenation: Pre-oxygenation with 100% oxygen Induction Type: IV induction Ventilation: Mask ventilation without difficulty and Oral airway inserted - appropriate to patient size Laryngoscope Size: Mac and 3 Grade View: Grade I Tube type: Oral Tube size: 7.0 mm Number of attempts: 1 Airway Equipment and Method: Stylet Placement Confirmation: ETT inserted through vocal cords under direct vision, positive ETCO2 and breath sounds checked- equal and bilateral Secured at: 21 cm Tube secured with: Tape Dental Injury: Teeth and Oropharynx as per pre-operative assessment

## 2023-04-16 NOTE — Anesthesia Procedure Notes (Signed)
Anesthesia Regional Block: Interscalene brachial plexus block   Pre-Anesthetic Checklist: , timeout performed,  Correct Patient, Correct Site, Correct Laterality,  Correct Procedure, Correct Position, site marked,  Risks and benefits discussed,  Surgical consent,  Pre-op evaluation,  At surgeon's request and post-op pain management  Laterality: Left  Prep: chloraprep       Needles:  Injection technique: Single-shot  Needle Type: Stimiplex     Needle Length: 9cm  Needle Gauge: 21     Additional Needles:   Procedures:,,,, ultrasound used (permanent image in chart),,    Narrative:  Start time: 04/16/2023 7:22 AM End time: 04/16/2023 7:27 AM Injection made incrementally with aspirations every 5 mL.  Performed by: Personally  Anesthesiologist: Lowella Curb, MD

## 2023-04-16 NOTE — Op Note (Unsigned)
[name], [name] MEDICAL RECORD NO: [id number] ACCOUNT NO: [id number] DATE OF BIRTH: 07-10-1960 FACILITY: [facility] LOCATION: [facility] PHYSICIAN: Graylin Shiver. August Saucer, MD  Operative Report   DATE OF PROCEDURE: 04/16/2023   PREOPERATIVE DIAGNOSIS:  Left shoulder arthritis and biceps tendinitis.  POSTOPERATIVE DIAGNOSIS:  Left shoulder arthritis and biceps tendinitis.  PROCEDURE:  Left shoulder reverse shoulder replacement and biceps tenodesis using comprehensive reverse shoulder system from [employer] with small augmented baseplate, 36 standard glenosphere set on 1.5 mm inferior offset with mini humeral stem size 8 and 36  mm standard bearing with mini humeral tray +6 taper offset, 40 mm.  SURGEON:  Graylin Shiver. August Saucer, MD  ASSISTANT:  Karenann Cai.  INDICATIONS:  Denise Morales is a 62 year old patient with severe end-stage left shoulder arthritis, who presents for operative management after explanation of risks and benefits.  DESCRIPTION OF PROCEDURE:  The patient was brought to the operating room where general endotracheal anesthesia was induced.  Preoperative antibiotics administered.  Timeout was called.  The patient was placed in the beach chair position with head in  neutral position, we did prescrub the shoulder with hydrogen peroxide followed by alcohol and Betadine, which was allowed to air dry, then prepped with ChloraPrep solution and draped in sterile manner.  Ioban used to cover the operative field.  After  calling timeout, deltopectoral approach was made over the coracoid process extending down about 3 cm below the axillary crease.  Skin and subcutaneous tissue were sharply divided.  Cephalic vein mobilized laterally.  The Kolbel retractor was placed.   Axillary nerve palpated and protected at all times during the case.  Anterior portion of the deltoid was elevated manually off its anterior attachment to decrease tension on the deltoid itself.  The patient did have a small anterior superior  rotator cuff  tear of the supraspinatus.  The biceps tendon was tenodesed to the pec tendon, which was released about 1.5 cm.  This was done with 5-0 Vicryl sutures.  The biceps tendon was then cut proximal to this tenodesis site and the rotator interval was opened  all the way to the base of the coracoid.  At this time, circumflex vessels were ligated.  Subscap was then detached from the lesser tuberosity using a 15 blade after tying with 2-0 Vicryl sutures.  This dissection was taken around to the 7 o'clock  position using electrocautery to initiate removal of the capsule from the humeral neck down about 2 cm.  At this time, the severe arthritis was present on the humeral side and the glenoid side.  The Browne retractor was then placed and the Kolbel  retractor was removed.  The head was dislocated.  Reaming was then performed starting at the superior aspect of the humeral head about 1 cm posterior to the bicipital groove.  Reaming was taken up to size 8.  The head was then cut in approximately 30  degrees of retroversion comparable to its native version. Then broaching was performed up to a size 8 and a cap was placed.  Posterior retractor and anterior retractor was then placed.  Circumferential excision of the labrum was then performed with care  being taken to avoid injury to the axillary nerve.  Bankart lesion created from the 12 o'clock to 6 o'clock position.  With good visualization the patient-specific guide was placed and reaming was performed.  Approximately 6-1/2 mm inferior ream.  Then,  a superior slightly anterior reaming performed for the small augment.  Good contact was achieved.  A compression  screw placed.  Then, 4 peripheral locking screws placed with good fixation achieved.  Next, we did trial reduction with the 36 standard  glenosphere and the +6 offset humeral tray with standard insert.  This gave very good stability to internal and external rotation as well as adduction, extension,  and forward force.  At this time, the trial components were removed.  True glenosphere was  placed onto the dried Morse taper with inferior offset about 1.5 mm.  The broach was removed and suture tapes were placed x6 in the lesser tuberosity and the humeral canal was irrigated with IrriSept solution followed by vancomycin powder placed after  IrriSept solution was removed.  We then tapped in the stem and same stability parameters were maintained with the +6 offset humeral tray and standard liner. We did try to use +3 glenosphere, but that would not fit within the space.  This made the current  construct match her other side.  A very good stability achieved and we put in the true components on the humeral tray side with very good stability, which was "2 fingers tight."  Thorough irrigation was performed.  IrriSept solution utilized. Subscap  tendon was reattached using 6 SutureTapes and Nice knots with the arm in 30 degrees of external rotation.  Following the repair we were able to get to about 50 degrees of external rotation.  IrriSept solution then utilized through the rotator interval  and we placed vancomycin powder on the implant.  Rotator interval was then closed using #1 Vicryl suture.  This was done with the arm in 30 degrees of external rotation.  The axillary nerve again palpated and found to be intact.  Deltopectoral incision  was then closed using #1 Vicryl suture followed by interrupted inverted 0 Vicryl suture, 2-0 Vicryl suture, and 3-0 Monocryl with Steri-Strips and Aquacel dressing applied.  The patient tolerated the procedure well without immediate complications.   [name]'s assistance was required at all times for retraction, opening, closing, mobilization of tissue.  His assistance was a medical necessity.      [email or web address] D: 04/16/2023 10:59:32 am T: 04/16/2023 11:42:00 am  JOB: [id number]/ [id number]

## 2023-04-17 ENCOUNTER — Encounter (HOSPITAL_COMMUNITY): Payer: Self-pay | Admitting: Orthopedic Surgery

## 2023-04-17 DIAGNOSIS — M19012 Primary osteoarthritis, left shoulder: Secondary | ICD-10-CM | POA: Diagnosis not present

## 2023-04-17 MED ORDER — OXYCODONE HCL 5 MG PO TABS: 5.0000 mg | ORAL_TABLET | ORAL | 0 refills | Status: DC | PRN

## 2023-04-17 MED ORDER — CELECOXIB 100 MG PO CAPS: 100.0000 mg | ORAL_CAPSULE | Freq: Two times a day (BID) | ORAL | 0 refills | Status: DC

## 2023-04-17 MED ORDER — ASPIRIN 81 MG PO TBEC: 81.0000 mg | DELAYED_RELEASE_TABLET | Freq: Every day | ORAL | 12 refills | Status: DC

## 2023-04-17 MED ORDER — METHOCARBAMOL 500 MG PO TABS: 500.0000 mg | ORAL_TABLET | Freq: Three times a day (TID) | ORAL | 0 refills | Status: DC | PRN

## 2023-04-17 NOTE — Progress Notes (Signed)
PT Cancellation Note and Discharge  Patient Details Name: Denise Morales MRN: [id number] DOB: 1961/06/21   Cancelled Treatment:    Reason Eval/Treat Not Completed: PT screened, no needs identified, will sign off. Discussed pt case with OT who reports pt is currently mobilizing without assistance and does not require a formal PT evaluation at this time. PT signing off. If needs change, please reconsult.     Marylynn Pearson 04/17/2023, 11:05 AM  Conni Slipper, PT, DPT Acute Rehabilitation Services Secure Chat Preferred Office: [phone]

## 2023-04-17 NOTE — Progress Notes (Signed)
  Subjective: Patient stable.  Pain controlled.  Block not yet worn off.   Objective: Vital signs in last 24 hours: Temp:  [96.8 F (36 C)-98 F (36.7 C)] 97.7 F (36.5 C) (10/22 0951) Pulse Rate:  [79-98] 79 (10/22 0951) Resp:  [16-20] 16 (10/22 0951) BP: (100-133)/(54-84) 100/61 (10/22 0951) SpO2:  [92 %-100 %] 97 % (10/22 0951)  Intake/Output from previous day: 10/21 0701 - 10/22 0700 In: 1710 [P.O.:960; I.V.:500; IV Piggyback:250] Out: 100 [Blood:100] Intake/Output this shift: No intake/output data recorded.  Exam:  No cellulitis present Compartment soft  Labs: No results for input(s): "HGB" in the last 72 hours. No results for input(s): "WBC", "RBC", "HCT", "PLT" in the last 72 hours. No results for input(s): "NA", "K", "CL", "CO2", "BUN", "CREATININE", "GLUCOSE", "CALCIUM" in the last 72 hours. No results for input(s): "LABPT", "INR" in the last 72 hours.  Assessment/Plan: Plan at this time is discharge to home.  Okay to use a CPM machine for 1 hour tonight.  He did then use the CPM brace 1 hour 3 times a day starting tomorrow.  Follow-up in 2 weeks.  Okay to shower dressing is waterproof.   G Scott Deyja Sochacki 04/17/2023, 10:50 AM

## 2023-04-17 NOTE — Progress Notes (Signed)
Patient alert and oriented, mae's well, voiding adequate amount of urine, swallowing without difficulty, no c/o pain at time of discharge. Patient discharged home with family. Script and discharged instructions given to patient. Patient and family stated understanding of instructions given. Patient has an appointment with Dr. Dean 

## 2023-04-17 NOTE — Evaluation (Signed)
Occupational Therapy Evaluation Patient Details Name: Denise Morales MRN: [id number] DOB: Mar 07, 1961 Today's Date: 04/17/2023   History of Present Illness Pt is a 62 y/o F s/p L TSA on 10/21. PMH includes allergies, asthma, anemia, fibromyalgia, GERD, HLD, HTN, hypothyroidism, R TSA, bil TKA   Clinical Impression   Pt reports ind at baseline with ADLs/functional mobility, lives with spouse who can assist at d/c. Pt currently needing set up - mod A for ADLs, CGA for bed mobility, and CGA for transfers without AD. Pt educated on shoulder precautions, sling wear, and compensatory strategies for ADLs/mobility. Pt with good understanding/demo as she reports she had her R shoulder replaced earlier this year. Pt with 2/4 DOE with activity, SpO2 97% on RA when checked. Pt presenting with impairments listed below, will follow acutely. Follow physician recommendations for follow up therapy needs.       If plan is discharge home, recommend the following: A little help with walking and/or transfers;A lot of help with bathing/dressing/bathroom;Assistance with cooking/housework;Assist for transportation;Help with stairs or ramp for entrance    Functional Status Assessment  Patient has had a recent decline in their functional status and demonstrates the ability to make significant improvements in function in a reasonable and predictable amount of time.  Equipment Recommendations  None recommended by OT (pt has all needed DME)    Recommendations for Other Services       Precautions / Restrictions Precautions Precautions: Shoulder Type of Shoulder Precautions: NWB LUE, sling at all times except ADL/exercise, ok for pendulums, ok for elbow/wrist/hand Shoulder Interventions: Shoulder sling/immobilizer;Off for dressing/bathing/exercises;At all times Precaution Booklet Issued: Yes (comment) Precaution Comments: pt provided with shoulder protocol handout, elbow/wrist/hand exercises, pendulum exercises, and  sling wear/positioning Required Braces or Orthoses: Sling Restrictions Weight Bearing Restrictions: Yes LUE Weight Bearing: Non weight bearing      Mobility Bed Mobility Overal bed mobility: Needs Assistance Bed Mobility: Sidelying to Sit, Sit to Sidelying   Sidelying to sit: Contact guard assist     Sit to sidelying: Contact guard assist General bed mobility comments: educated on rolling to R side    Transfers Overall transfer level: Needs assistance Equipment used: None Transfers: Sit to/from Stand Sit to Stand: Contact guard assist                  Balance Overall balance assessment: Mild deficits observed, not formally tested                                         ADL either performed or assessed with clinical judgement   ADL Overall ADL's : Needs assistance/impaired Eating/Feeding: Set up   Grooming: Set up   Upper Body Bathing: Minimal assistance;Moderate assistance   Lower Body Bathing: Minimal assistance   Upper Body Dressing : Minimal assistance;Moderate assistance   Lower Body Dressing: Minimal assistance   Toilet Transfer: Contact guard assist;Ambulation;Regular Toilet   Toileting- Clothing Manipulation and Hygiene: Contact guard assist       Functional mobility during ADLs: Contact guard assist       Vision   Vision Assessment?: No apparent visual deficits     Perception Perception: Not tested       Praxis Praxis: Not tested       Pertinent Vitals/Pain Pain Assessment Pain Assessment: No/denies pain     Extremity/Trunk Assessment Upper Extremity Assessment Upper Extremity Assessment: LUE deficits/detail LUE Deficits / Details:  s/p L TSA, reports nerve block still intact to entire arm and 1st and 2nd digits on L hand, able to perform elbow/wrist/hand exercises appropriately LUE: Unable to fully assess due to immobilization LUE Sensation: WNL LUE Coordination: decreased fine motor;decreased gross motor    Lower Extremity Assessment Lower Extremity Assessment: Generalized weakness   Cervical / Trunk Assessment Cervical / Trunk Assessment: Normal   Communication Communication Communication: No apparent difficulties   Cognition Arousal: Alert Behavior During Therapy: WFL for tasks assessed/performed Overall Cognitive Status: Within Functional Limits for tasks assessed                                       General Comments  VSS    Exercises Exercises: Shoulder Shoulder Exercises Pendulum Exercise: Left, 5 reps, Seated Elbow Flexion: AROM, Left, 5 reps, Seated Elbow Extension: AROM, Left, 5 reps, Seated Wrist Flexion: AROM, Left, 5 reps, Seated Wrist Extension: AROM, Left, 5 reps, Seated Digit Composite Flexion: AROM, Left, 5 reps, Seated Composite Extension: AROM, Left, 5 reps, Seated Neck Flexion: AROM, 5 reps, Seated Neck Extension: AROM, 5 reps, Seated Neck Lateral Flexion - Right: AROM, 5 reps, Seated Neck Lateral Flexion - Left: AROM, 5 reps, Seated   Shoulder Instructions Shoulder Instructions Donning/doffing shirt without moving shoulder: Moderate assistance Method for sponge bathing under operated UE:  (verbalized technique) Donning/doffing sling/immobilizer: Moderate assistance Correct positioning of sling/immobilizer: Moderate assistance Pendulum exercises (written home exercise program): Supervision/safety ROM for elbow, wrist and digits of operated UE: Supervision/safety Sling wearing schedule (on at all times/off for ADL's): Supervision/safety Proper positioning of operated UE when showering:  (verbally reviewed) Positioning of UE while sleeping: Supervision/safety    Home Living Family/patient expects to be discharged to:: Private residence Living Arrangements: Spouse/significant other Available Help at Discharge: Family;Available 24 hours/day Type of Home: House Home Access: Stairs to enter [employer] of Steps: 3 Entrance  Stairs-Rails: Right Home Layout: One level     Bathroom Shower/Tub: Producer, television/film/video: Standard Bathroom Accessibility: Yes   Home Equipment: Shower seat - built in;Cane - single Librarian, academic (2 wheels);Rollator (4 wheels);BSC/3in1;Shower seat;Grab bars - tub/shower;Hand held shower head          Prior Functioning/Environment Prior Level of Function : Independent/Modified Independent                        OT Problem List: Decreased strength;Decreased range of motion;Decreased activity tolerance;Decreased safety awareness;Decreased knowledge of precautions;Impaired UE functional use;Decreased knowledge of use of DME or AE      OT Treatment/Interventions:      OT Goals(Current goals can be found in the care plan section) Acute Rehab OT Goals Patient Stated Goal: none stated OT Goal Formulation: With patient Time For Goal Achievement: 05/01/23 Potential to Achieve Goals: Good  OT Frequency:      Co-evaluation              AM-PAC OT "6 Clicks" Daily Activity     Outcome Measure Help from another person eating meals?: None Help from another person taking care of personal grooming?: A Little Help from another person toileting, which includes using toliet, bedpan, or urinal?: A Little Help from another person bathing (including washing, rinsing, drying)?: A Lot Help from another person to put on and taking off regular upper body clothing?: A Lot Help from another person to put on and taking  off regular lower body clothing?: A Little 6 Click Score: 17   End of Session Equipment Utilized During Treatment: Other (comment);Gait belt (sling) Nurse Communication: Mobility status;Weight bearing status;Precautions  Activity Tolerance: Patient tolerated treatment well Patient left: in bed;with call bell/phone within reach  OT Visit Diagnosis: Unsteadiness on feet (R26.81);Other abnormalities of gait and mobility (R26.89);Muscle weakness  (generalized) (M62.81)                Time: 1610-9604 OT Time Calculation (min): 33 min Charges:  OT General Charges $OT Visit: 1 Visit OT Evaluation $OT Eval Moderate Complexity: 1 Mod OT Treatments $Self Care/Home Management : 8-22 mins  Carver Fila, OTD, OTR/L SecureChat Preferred Acute Rehab [phone]   Carver Fila Koonce 04/17/2023, 10:37 AM

## 2023-04-23 ENCOUNTER — Telehealth: Payer: Self-pay | Admitting: Surgical

## 2023-04-23 NOTE — Telephone Encounter (Signed)
Called pt to RS and she would like to come in at 9:30 on 11/01

## 2023-04-24 DIAGNOSIS — M7522 Bicipital tendinitis, left shoulder: Secondary | ICD-10-CM

## 2023-04-24 DIAGNOSIS — M19012 Primary osteoarthritis, left shoulder: Secondary | ICD-10-CM

## 2023-04-27 ENCOUNTER — Telehealth: Payer: Self-pay | Admitting: Orthopedic Surgery

## 2023-04-27 ENCOUNTER — Other Ambulatory Visit (INDEPENDENT_AMBULATORY_CARE_PROVIDER_SITE_OTHER): Payer: Self-pay

## 2023-04-27 ENCOUNTER — Ambulatory Visit (INDEPENDENT_AMBULATORY_CARE_PROVIDER_SITE_OTHER): Payer: Medicaid Other | Admitting: Surgical

## 2023-04-27 DIAGNOSIS — Z96612 Presence of left artificial shoulder joint: Secondary | ICD-10-CM | POA: Diagnosis not present

## 2023-04-27 NOTE — Telephone Encounter (Signed)
Pt called in stating insurance denied her machine and an appeal letter will be coming in soon please advise

## 2023-04-29 ENCOUNTER — Encounter: Payer: Self-pay | Admitting: Surgical

## 2023-04-29 NOTE — Progress Notes (Signed)
Post-Op Visit Note   Patient: Denise Morales           Date of Birth: [birth year]           MRN: [id number] Visit Date: 04/27/2023 PCP: Park Meo, FNP   Assessment & Plan:  Chief Complaint:  Chief Complaint  Patient presents with   Left Shoulder - Routine Post Op    LEFT RSA (surgery date 04-16-23)   Visit Diagnoses:  1. History of arthroplasty of left shoulder     Plan: Denise Morales is a 62 y.o. female who presents s/p left reverse shoulder arthroplasty on 04/16/2023.  Patient is doing well and pain is overall controlled.  Using CPM machine as instructed.  Denies any chest pain, SOB, fevers, chills.  No complaint of any instability symptoms.  No drainage.  No dizziness.  Only taking oxycodone at night.  Takes Tylenol about every 4-6 hours..    On exam, patient has range of motion 15 degrees X rotation, 80 degrees abduction, 95 degrees forward elevation passively.  Intact EPL, FPL, finger abduction, finger adduction, pronation/supination, bicep, tricep, deltoid of operative extremity.  Axillary nerve intact with deltoid firing.  Incision is healing well without evidence of infection or dehiscence.  Incision was made sure to be covered with Steri-Strips from the proximal to distal aspect of the length of the incision.  2+ radial pulse of the operative extremity  Plan is discontinue sling.  Okay to very lightly lift with the operative extremity but no lifting anything heavier than a coffee cup or cell phone.  Start physical therapy to focus on passive range of motion and active range of motion with deltoid isometrics.  Do not want to externally rotate past 30 degrees to protect subscapularis repair.  Follow-up in 4 weeks for clinical recheck with Dr. August Saucer.  Follow-Up Instructions: No follow-ups on file.   Orders:  Orders Placed This Encounter  Procedures   XR Shoulder Left   Ambulatory referral to Physical Therapy   No orders of the defined types were placed in this  encounter.   Imaging: No results found.  PMFS History: Patient Active Problem List   Diagnosis Date Noted   Arthritis of left shoulder region 04/24/2023   Biceps tendonitis, left 04/24/2023   S/P reverse total shoulder arthroplasty, left 04/16/2023   Depression, recurrent (HCC) 04/03/2023   S/P repair of paraesophageal hernia 03/12/2023   Arthritis of right shoulder region 12/24/2022   Biceps tendonitis on right 12/24/2022   S/P reverse total shoulder arthroplasty, right 12/05/2022   Vertigo 11/27/2022   Iron deficiency anemia 10/30/2022   Physical exam, annual 10/30/2022   Medication management 10/30/2022   Class 2 severe obesity due to excess calories with serious comorbidity in adult, unspecified BMI (HCC) 10/23/2022   Essential hypertension 10/23/2022   Prediabetes 10/23/2022   Hypothyroidism 10/23/2022   Insomnia 06/14/2022   Status post total left knee replacement 07/15/2021   Neoplasm of uncertain behavior of skin 05/25/2021   Generalized osteoarthritis of multiple sites 05/25/2021   Fibromyalgia syndrome 05/25/2021   Restless legs 03/16/2021   Hyperlipidemia 03/16/2021   Anxiety 03/16/2021   Status post right knee replacement 12/24/2020   Unilateral primary osteoarthritis, left knee 11/11/2020   Unilateral primary osteoarthritis, right knee 11/11/2020   Complete tear of right rotator cuff 08/12/2020   Hypertensive disorder 05/05/2019   Past Medical History:  Diagnosis Date   Allergy    Anemia    Anxiety    Arthritis  Depression    Fibromyalgia    GERD (gastroesophageal reflux disease)    Heart murmur    History of hiatal hernia    Hyperlipidemia    Hypertension    Hypothyroidism    Melanoma (HCC)    face    Family History  Problem Relation Age of Onset   Dementia Mother    Heart Problems Mother    Stroke Mother    Hypertension Mother    Hypertension Father    Colon polyps Father    Diabetes Father    Hypertension Sister    Diabetes Sister         borderline   Fibromyalgia Sister    Hypertension Sister    Colon polyps Sister    Diabetes Sister    Fibromyalgia Sister    Heart Problems Sister    Arthritis Sister    Hypertension Sister    Heart Problems Sister    Healthy Daughter    Diabetes Paternal Aunt    Colon cancer Neg Hx    Esophageal cancer Neg Hx    Rectal cancer Neg Hx    Stomach cancer Neg Hx    Breast cancer Neg Hx     Past Surgical History:  Procedure Laterality Date   ABDOMINAL HYSTERECTOMY  1985   AIKEN OSTEOTOMY Right 06/05/2022   Procedure: Quintella Reichert OSTEOTOMY;  Surgeon: Candelaria Stagers, DPM;  Location:  [place];  Service: Podiatry;  Laterality: Right;   BICEPT TENODESIS Right 12/05/2022   Procedure: RIGHT BICEPS TENODESIS;  Surgeon: Cammy Copa, MD;  [facility];  Service: Orthopedics;  Laterality: Right;   CAPSULOTOMY METATARSOPHALANGEAL Right 06/05/2022   Procedure: CAPSULOTOMY METATARSOPHALANGEAL;  Surgeon: Candelaria Stagers, DPM;  Location: [facility];  Service: Podiatry;  Laterality: Right;   CHOLECYSTECTOMY     COLONOSCOPY     HALLUX VALGUS LAPIDUS Right 06/05/2022   Procedure: HALLUX VALGUS LAPIDUS;  Surgeon: Candelaria Stagers, DPM;  Location: [facility];  Service: Podiatry;  Laterality: Right;   HAMMER TOE SURGERY Right 06/05/2022   Procedure: HAMMER TOE CORRECTION;  Surgeon: Candelaria Stagers, DPM;  Location: [facility] ;  Service: Podiatry;  Laterality: Right;   REVERSE SHOULDER ARTHROPLASTY Right 12/05/2022   Procedure: RIGHT REVERSE SHOULDER ARTHROPLASTY;  Surgeon: Cammy Copa, MD;  Location: [facility] OR;  Service: Orthopedics;  Laterality: Right;   REVERSE SHOULDER ARTHROPLASTY Left 04/16/2023   Procedure: REVERSE SHOULDER ARTHROPLASTY;  Surgeon: Cammy Copa, MD;  Location: [facility] OR;  Service: Orthopedics;  Laterality: Left;   SHOULDER ARTHROSCOPY WITH ROTATOR CUFF REPAIR AND SUBACROMIAL DECOMPRESSION Right 08/12/2020    Procedure: RIGHT SHOULDER ARTHROSCOPY WITH ROTATOR CUFF REPAIR AND SUBACROMIAL DECOMPRESSION;  Surgeon: Kathryne Hitch, MD;  Location: [facility];  Service: Orthopedics;  Laterality: Right;   TOTAL KNEE ARTHROPLASTY Right 12/24/2020   Procedure: RIGHT TOTAL KNEE ARTHROPLASTY;  Surgeon: Kathryne Hitch, MD;  Location: [facility];  Service: Orthopedics;  Laterality: Right;   TOTAL KNEE ARTHROPLASTY Left 07/15/2021   Procedure: LEFT TOTAL KNEE ARTHROPLASTY;  Surgeon: Kathryne Hitch, MD;  Location: [facility];  Service: Orthopedics;  Laterality: Left;   XI ROBOTIC ASSISTED PARAESOPHAGEAL HERNIA REPAIR N/A 03/12/2023   Procedure: XI ROBOTIC ASSISTED PARAESOPHAGEAL HERNIA REPAIR WITH FUNDOPLICATION;  Surgeon: Berna Bue, MD;  Location: [facility];  Service: General;  Laterality: N/A;  180   Social History   Occupational History   Not on file  Tobacco Use  Smoking status: Never    Passive exposure: Past   Smokeless tobacco: Never  Vaping Use   Vaping status: Never Used  Substance and Sexual Activity   Alcohol use: Not Currently   Drug use: Never   Sexual activity: Not Currently    Birth control/protection: Surgical    Comment: Hysterectomy

## 2023-04-29 NOTE — Discharge Summary (Signed)
Physician Discharge Summary      Patient ID: Denise Morales MRN: [id number] DOB/AGE: [birth year] 62 y.o.  Admit date: 04/16/2023 Discharge date: 04/17/2023  Admission Diagnoses:  Principal Problem:   S/P reverse total shoulder arthroplasty, left Active Problems:   Arthritis of left shoulder region   Biceps tendonitis, left   Discharge Diagnoses:  Same  Surgeries: Procedure(s): REVERSE SHOULDER ARTHROPLASTY on 04/16/2023   Consultants:   Discharged Condition: Stable  Hospital Course: Denise Morales is an 62 y.o. female who was admitted 04/16/2023 with a chief complaint of left shoulder pain, and found to have a diagnosis of left shoulder arthritis.  They were brought to the operating room on 04/16/2023 and underwent the above named procedures.  Pt awoke from anesthesia without complication and was transferred to the floor. On POD1, patient's pain was controlled.  No red flag signs or symptoms..  Discharged home on POD 1.  Pt will f/u with Dr. August Saucer in clinic in ~2 weeks.   Antibiotics given:  Anti-infectives (From admission, onward)    Start     Dose/Rate Route Frequency Ordered Stop   04/16/23 1600  ceFAZolin (ANCEF) IVPB 2g/100 mL premix        2 g 200 mL/hr over 30 Minutes Intravenous Every 8 hours 04/16/23 1231 04/17/23 1341   04/16/23 0951  vancomycin (VANCOCIN) powder  Status:  Discontinued          As needed 04/16/23 0952 04/16/23 1109   04/16/23 0600  ceFAZolin (ANCEF) IVPB 2g/100 mL premix        2 g 200 mL/hr over 30 Minutes Intravenous On call to O.R. 04/16/23 3329 04/16/23 0802     .  Recent vital signs:  Vitals:   04/17/23 0350 04/17/23 0951  BP: 122/74 100/61  Pulse: 79 79  Resp: 20 16  Temp: (!) 97.5 F (36.4 C) 97.7 F (36.5 C)  SpO2: 97% 97%    Recent laboratory studies:  Results for orders placed or performed during the hospital encounter of 04/11/23  Surgical pcr screen   Specimen: Nasal Mucosa; Nasal Swab  Result Value Ref Range    MRSA, PCR NEGATIVE NEGATIVE   Staphylococcus aureus NEGATIVE NEGATIVE  CBC  Result Value Ref Range   WBC 8.0 4.0 - 10.5 K/uL   RBC 4.65 3.87 - 5.11 MIL/uL   Hemoglobin 13.1 12.0 - 15.0 g/dL   HCT 51.8 84.1 - 66.0 %   MCV 87.7 80.0 - 100.0 fL   MCH 28.2 26.0 - 34.0 pg   MCHC 32.1 30.0 - 36.0 g/dL   RDW 63.0 16.0 - 10.9 %   Platelets 281 150 - 400 K/uL   nRBC 0.0 0.0 - 0.2 %  Basic metabolic panel  Result Value Ref Range   Sodium 139 135 - 145 mmol/L   Potassium 3.4 (L) 3.5 - 5.1 mmol/L   Chloride 104 98 - 111 mmol/L   CO2 26 22 - 32 mmol/L   Glucose, Bld 105 (H) 70 - 99 mg/dL   BUN 21 8 - 23 mg/dL   Creatinine, Ser 3.23 0.44 - 1.00 mg/dL   Calcium 9.0 8.9 - 55.7 mg/dL   GFR, Estimated >32 >20 mL/min   Anion gap 9 5 - 15  Urinalysis, w/ Reflex to Culture (Infection Suspected) -Urine, Clean Catch  Result Value Ref Range   Specimen Source URINE, CLEAN CATCH    Color, Urine YELLOW YELLOW   APPearance CLEAR CLEAR   Specific Gravity, Urine 1.020 1.005 - 1.030  pH 6.0 5.0 - 8.0   Glucose, UA NEGATIVE NEGATIVE mg/dL   Hgb urine dipstick NEGATIVE NEGATIVE   Bilirubin Urine NEGATIVE NEGATIVE   Ketones, ur NEGATIVE NEGATIVE mg/dL   Protein, ur NEGATIVE NEGATIVE mg/dL   Nitrite NEGATIVE NEGATIVE   Leukocytes,Ua TRACE (A) NEGATIVE   RBC / HPF 0-5 0 - 5 RBC/hpf   WBC, UA 6-10 0 - 5 WBC/hpf   Bacteria, UA NONE SEEN NONE SEEN   Squamous Epithelial / HPF 0-5 0 - 5 /HPF  Type and screen [facility]  Result Value Ref Range   ABO/RH(D) O POS    Antibody Screen NEG    Sample Expiration 04/25/2023,2359    Extend sample reason      NO TRANSFUSIONS OR PREGNANCY IN THE PAST 3 MONTHS Performed at [facility], [address], [place], [place] [address]     Discharge Medications:   Allergies as of 04/17/2023       Reactions   Hydroxyzine Other (See Comments)   hallucinations        Medication List     STOP taking these medications    diphenhydrAMINE  25 mg capsule Commonly known as: BENADRYL   Krill Oil 500 MG Caps   pregabalin 75 MG capsule Commonly known as: LYRICA       TAKE these medications    acetaminophen 500 MG tablet Commonly known as: TYLENOL Take 500-1,000 mg by mouth every 6 (six) hours as needed (pain).   aspirin EC 81 MG tablet Take 1 tablet (81 mg total) by mouth daily at 10 pm. Swallow whole.   celecoxib 100 MG capsule Commonly known as: CELEBREX Take 1 capsule (100 mg total) by mouth 2 (two) times daily. What changed:  medication strength how much to take when to take this reasons to take this   dicyclomine 10 MG capsule Commonly known as: BENTYL Take 10 mg by mouth 4 (four) times daily as needed for spasms.   escitalopram 20 MG tablet Commonly known as: Lexapro Take 1 tablet (20 mg total) by mouth daily.   Iron (Ferrous Sulfate) 325 (65 Fe) MG Tabs Take 325 mg by mouth daily. What changed: when to take this   levothyroxine 50 MCG tablet Commonly known as: SYNTHROID Take 1 tablet (50 mcg total) by mouth daily.   lisinopril-hydrochlorothiazide 10-12.5 MG tablet Commonly known as: ZESTORETIC Take 1 tablet by mouth daily.   methocarbamol 500 MG tablet Commonly known as: ROBAXIN Take 1 tablet (500 mg total) by mouth every 8 (eight) hours as needed for muscle spasms.   oxyCODONE 5 MG immediate release tablet Commonly known as: Oxy IR/ROXICODONE Take 1 tablet (5 mg total) by mouth every 4 (four) hours as needed for moderate pain (pain score 4-6) (pain score 4-6).   pantoprazole 40 MG tablet Commonly known as: PROTONIX TAKE 1 TABLET BY MOUTH TWICE DAILY BEFORE A MEAL What changed: when to take this   pravastatin 40 MG tablet Commonly known as: PRAVACHOL Take 1 tablet (40 mg total) by mouth daily. What changed: when to take this   QUEtiapine 50 MG tablet Commonly known as: SEROQUEL TAKE 1 TABLET BY MOUTH AT BEDTIME ON MONDAYS, WEDNESDAYS , FRIDAYS AND  SATURDAYS   sucralfate 1 g  tablet Commonly known as: CARAFATE TAKE 1 TABLET BY MOUTH THREE TIMES DAILY AS NEEDED FOR HEART BURN   TURMERIC PO Take 1,000 mg by mouth in the morning.   VITAMIN K2-VITAMIN D3 PO Take 1 tablet  by mouth in the morning. 100 mg/50 mcg   zolpidem 5 MG tablet Commonly known as: AMBIEN TAKE 1 TABLET BY MOUTH EVERY OTHER NIGHT. ALTERNATING WITH QUETIAPINE        Diagnostic Studies: XR Shoulder Left  Result Date: 04/29/2023 AP, scapular Y, axillary views of the shoulder reviewed.  Reverse shoulder arthroplasty prosthesis in good position and alignment without any complicating features.  There is no evidence of periprosthetic fracture, dislocation, dissociation of the glenosphere.   DG Shoulder Left Port  Result Date: 04/16/2023 CLINICAL DATA:  Postop. EXAM: LEFT SHOULDER COMPARISON:  Preoperative CT. FINDINGS: Reverse left shoulder arthroplasty in expected alignment. No periprosthetic lucency or fracture. Recent postsurgical change includes air and edema in the joint space and soft tissues. IMPRESSION: Reverse left shoulder arthroplasty without immediate postoperative complication. Electronically Signed   By: Narda Rutherford M.D.   On: 04/16/2023 14:23    Disposition: Discharge disposition: 01-Home or Self Care       Discharge Instructions     Call MD / Call 911   Complete by: As directed    If you experience chest pain or shortness of breath, CALL 911 and be transported to the hospital emergency room.  If you develope a fever above 101 F, pus (white drainage) or increased drainage or redness at the wound, or calf pain, call your surgeon's office.   Constipation Prevention   Complete by: As directed    Drink plenty of fluids.  Prune juice may be helpful.  You may use a stool softener, such as Colace (over the counter) 100 mg twice a day.  Use MiraLax (over the counter) for constipation as needed.   Diet - low sodium heart healthy   Complete by: As directed    Discharge  instructions   Complete by: As directed    Use the range of motion brace tonight for 1 hour then tomorrow 1 hour 3 times a day. Okay to shower dressing is waterproof Make sure to put the brace up into the armpit in order to make sure the motion measurements are accurate in terms of getting the shoulder moving passively No lifting with left arm   Increase activity slowly as tolerated   Complete by: As directed    Post-operative opioid taper instructions:   Complete by: As directed    POST-OPERATIVE OPIOID TAPER INSTRUCTIONS: It is important to wean off of your opioid medication as soon as possible. If you do not need pain medication after your surgery it is ok to stop day one. Opioids include: Codeine, Hydrocodone(Norco, Vicodin), Oxycodone(Percocet, oxycontin) and hydromorphone amongst others.  Long term and even short term use of opiods can cause: Increased pain response Dependence Constipation Depression Respiratory depression And more.  Withdrawal symptoms can include Flu like symptoms Nausea, vomiting And more Techniques to manage these symptoms Hydrate well Eat regular healthy meals Stay active Use relaxation techniques(deep breathing, meditating, yoga) Do Not substitute Alcohol to help with tapering If you have been on opioids for less than two weeks and do not have pain than it is ok to stop all together.  Plan to wean off of opioids This plan should start within one week post op of your joint replacement. Maintain the same interval or time between taking each dose and first decrease the dose.  Cut the total daily intake of opioids by one tablet each day Next start to increase the time between doses. The last dose that should be eliminated is the evening dose.  SignedKarenann Cai 04/29/2023, 10:08 AM

## 2023-04-29 NOTE — Telephone Encounter (Signed)
Denise Morales is a patient who underwent right reverse shoulder replacement prior to this current surgery.  She did very well with that surgery with the use of a CPM machine.  Her left shoulder is also significantly affected and she has significantly diminished active and passive range of motion.  For that reason in order to facilitate the best possible outcome for someone with bilateral shoulder replacements it is very important that range of motion be maximized in order to facilitate the ability for this patient to care for herself in terms of activities of daily living and toiletries.  In my opinion the CPM machine is a medical necessity to optimize that possibility of the patient with 2 shoulder replacements being able to care for her self in all aspects of care and not be limited by  stiff shoulders.  If you have any questions please not hesitate to call.

## 2023-04-30 ENCOUNTER — Encounter: Payer: Medicaid Other | Admitting: Surgical

## 2023-04-30 ENCOUNTER — Other Ambulatory Visit: Payer: Self-pay | Admitting: Family Medicine

## 2023-04-30 NOTE — Telephone Encounter (Signed)
Letter generated

## 2023-05-02 NOTE — Therapy (Signed)
OUTPATIENT PHYSICAL THERAPY SHOULDER EVALUATION   Patient Name: Denise Morales MRN: [id number] DOB:03/19/1961, 62 y.o., female Today's Date: 05/03/2023  END OF SESSION:  PT End of Session - 05/03/23 1224     Visit Number 1    Number of Visits 17    Date for PT Re-Evaluation 06/28/23    Authorization Type [employer]    PT Start Time 0935    PT Stop Time 1016    PT Time Calculation (min) 41 min    Activity Tolerance Patient tolerated treatment well    Behavior During Therapy WFL for tasks assessed/performed             Past Medical History:  Diagnosis Date   Allergy    Anemia    Anxiety    Arthritis    Depression    Fibromyalgia    GERD (gastroesophageal reflux disease)    Heart murmur    History of hiatal hernia    Hyperlipidemia    Hypertension    Hypothyroidism    Melanoma (HCC)    face   Past Surgical History:  Procedure Laterality Date   ABDOMINAL HYSTERECTOMY  1985   AIKEN OSTEOTOMY Right 06/05/2022   Procedure: Quintella Reichert OSTEOTOMY;  Surgeon: Candelaria Stagers, DPM;  Location: [facility];  Service: Podiatry;  Laterality: Right;   BICEPT TENODESIS Right 12/05/2022   Procedure: RIGHT BICEPS TENODESIS;  Surgeon: Cammy Copa, MD;  Location: [facility] OR;  Service: Orthopedics;  Laterality: Right;   CAPSULOTOMY METATARSOPHALANGEAL Right 06/05/2022   Procedure: CAPSULOTOMY METATARSOPHALANGEAL;  Surgeon: Candelaria Stagers, DPM;  Location: [facility];  Service: Podiatry;  Laterality: Right;   CHOLECYSTECTOMY     COLONOSCOPY     HALLUX VALGUS LAPIDUS Right 06/05/2022   Procedure: HALLUX VALGUS LAPIDUS;  Surgeon: Candelaria Stagers, DPM;  Location: [facility];  Service: Podiatry;  Laterality: Right;   HAMMER TOE SURGERY Right 06/05/2022   Procedure: HAMMER TOE CORRECTION;  Surgeon: Candelaria Stagers, DPM;  Location: [facility] [place];  Service: Podiatry;  Laterality: Right;   REVERSE SHOULDER ARTHROPLASTY Right  12/05/2022   Procedure: RIGHT REVERSE SHOULDER ARTHROPLASTY;  Surgeon: Cammy Copa, MD;  Location: [facility] OR;  Service: Orthopedics;  Laterality: Right;   REVERSE SHOULDER ARTHROPLASTY Left 04/16/2023   Procedure: REVERSE SHOULDER ARTHROPLASTY;  Surgeon: Cammy Copa, MD;  Location: [facility] OR;  Service: Orthopedics;  Laterality: Left;   SHOULDER ARTHROSCOPY WITH ROTATOR CUFF REPAIR AND SUBACROMIAL DECOMPRESSION Right 08/12/2020   Procedure: RIGHT SHOULDER ARTHROSCOPY WITH ROTATOR CUFF REPAIR AND SUBACROMIAL DECOMPRESSION;  Surgeon: Kathryne Hitch, MD;  Location: [facility];  Service: Orthopedics;  Laterality: Right;   TOTAL KNEE ARTHROPLASTY Right 12/24/2020   Procedure: RIGHT TOTAL KNEE ARTHROPLASTY;  Surgeon: Kathryne Hitch, MD;  Location: [facility];  Service: Orthopedics;  Laterality: Right;   TOTAL KNEE ARTHROPLASTY Left 07/15/2021   Procedure: LEFT TOTAL KNEE ARTHROPLASTY;  Surgeon: Kathryne Hitch, MD;  Location: [facility];  Service: Orthopedics;  Laterality: Left;   XI ROBOTIC ASSISTED PARAESOPHAGEAL HERNIA REPAIR N/A 03/12/2023   Procedure: XI ROBOTIC ASSISTED PARAESOPHAGEAL HERNIA REPAIR WITH FUNDOPLICATION;  Surgeon: Berna Bue, MD;  Location: [facility];  Service: General;  Laterality: N/A;  180   Patient Active Problem List   Diagnosis Date Noted   Arthritis of left shoulder region 04/24/2023   Biceps tendonitis, left 04/24/2023   S/P reverse total shoulder arthroplasty, left 04/16/2023   Depression, recurrent (HCC)  04/03/2023   S/P repair of paraesophageal hernia 03/12/2023   Arthritis of right shoulder region 12/24/2022   Biceps tendonitis on right 12/24/2022   S/P reverse total shoulder arthroplasty, right 12/05/2022   Vertigo 11/27/2022   Iron deficiency anemia 10/30/2022   Physical exam, annual 10/30/2022   Medication management 10/30/2022   Class 2 severe obesity due to excess calories with serious comorbidity in adult,  unspecified BMI (HCC) 10/23/2022   Essential hypertension 10/23/2022   Prediabetes 10/23/2022   Hypothyroidism 10/23/2022   Insomnia 06/14/2022   Status post total left knee replacement 07/15/2021   Neoplasm of uncertain behavior of skin 05/25/2021   Generalized osteoarthritis of multiple sites 05/25/2021   Fibromyalgia syndrome 05/25/2021   Restless legs 03/16/2021   Hyperlipidemia 03/16/2021   Anxiety 03/16/2021   Status post right knee replacement 12/24/2020   Unilateral primary osteoarthritis, left knee 11/11/2020   Unilateral primary osteoarthritis, right knee 11/11/2020   Complete tear of right rotator cuff 08/12/2020   Hypertensive disorder 05/05/2019    PCP: Park Meo, FNP   REFERRING PROVIDER: Park Meo, FNP   REFERRING DIAG: [phone] (ICD-10-CM) - History of arthroplasty of left shoulder   THERAPY DIAG:  Acute pain of left shoulder - Plan: PT plan of care cert/re-cert  Muscle weakness (generalized) - Plan: PT plan of care cert/re-cert  Rationale for Evaluation and Treatment: Rehabilitation  ONSET DATE: L  reverseTSA   on 04/16/23  SUBJECTIVE:                                                                                                                                                                                      SUBJECTIVE STATEMENT: I have been using CPM machine and doing light activities in the home like washing cups and not trying to carry anything heavy, L TSA on 10/21/ 24 by Dr August Saucer.  MD as DC sling. I feel like it is a toothache pain last night. I would like to be able to return to yard work and my mud room. I am having to maneuver clothes so I can try to dress myself.  I have been here before with Lelon Mast last time  Hand dominance: Right  PERTINENT HISTORY: allergies, asthma, anemia, fibromyalgia, GERD, HLD, HTN, hypothyroidism, R TSA, bil TKA, basal cell face CA, R RTC before R TSA, bunion on R foot, hernia repair,    PAIN:  Are you  having pain? Yes: NPRS scale: at rest 0/10 at worst 4/10 since surgery with minimal movement before surgery 10/10 Pain location: Left shoulder Pain description: achy Aggravating factors: lying on shoulder initially hurts but then adjusts.  Cannot drive,  I can  shower but I assist my left arm with my right, I compensate and tie my bra in the front to dress.  Relieving factors: Resting Toothache pain PRECAUTIONS: Plan is discontinue sling. Okay to very lightly lift with the operative extremity but no lifting anything heavier than a coffee cup or cell phone. Start physical therapy to focus on PROM and AROM with deltoid isometrics. Do not want to externally rotate past 30 degrees to protect subscapularis repair. Follow-up in 4 weeks for clinical recheck with Dr. August Saucer.   RED FLAGS: None   WEIGHT BEARING RESTRICTIONS: Yes No weight bearing until released for AROM next visit  FALLS:  Has patient fallen in last 6 months?   YES  slipped on deck steps but no issues other than that time.  LIVING ENVIRONMENT: Lives with: lives with their family and including dtr SIL and grandkids Lives in: House/apartment Stairs: Yes: External: 5 steps; can reach both Has following equipment at home: Single point cane, Walker - 2 wheeled, Wheelchair (manual), Shower bench, and bed side commode  OCCUPATION: Retired in Community education officer   PLOF: Independent  PATIENT GOALS:To use my arm with out limitation  NEXT MD VISIT:  in 2 weeks   OBJECTIVE:  Note: Objective measures were completed at Evaluation unless otherwise noted.  DIAGNOSTIC FINDINGS:  04-27-23 AP, scapular Y, axillary views of the shoulder reviewed.  Reverse shoulder  arthroplasty prosthesis in good position and alignment without any  complicating features.  There is no evidence of periprosthetic fracture,  dislocation, dissociation of the glenosphere.   PATIENT SURVEYS:  FOTO 32%  predicted 54%  COGNITION: Overall cognitive status: Within functional  limits for tasks assessed     SENSATION: WFL  POSTURE: Rounded shoulders  Right shoulder more forward than Left  UPPER EXTREMITY ROM:   Active ROM Right eval Left eval  Shoulder flexion 131/ PROM 145 NT to surgery 04-16-23  Shoulder extension 125/ PROM 142   Shoulder abduction    Shoulder adduction    Shoulder internal rotation    Shoulder external rotation    Elbow flexion    Elbow extension    Wrist flexion    Wrist extension    Wrist ulnar deviation    Wrist radial deviation    Wrist pronation    Wrist supination    (Blank rows = not tested)  UPPER EXTREMITY MMT:not formally assess at eval d/t ROM deficits and post-op status   MMT Right eval Left eval  Shoulder flexion  NT due to recent TSA surgery < 2 weeks  Shoulder extension    Shoulder abduction    Shoulder adduction    Shoulder internal rotation    Shoulder external rotation    Middle trapezius    Lower trapezius    Elbow flexion    Elbow extension    Wrist flexion    Wrist extension    Wrist ulnar deviation    Wrist radial deviation    Wrist pronation    Wrist supination    Grip strength (lbs)    (Blank rows = not tested)  SHOULDER SPECIAL TESTS: NT due to L TSA reverse  JOINT MOBILITY TESTING:  NT  PALPATION:  Pt with steri strips on surgical site well intact   TODAY'S TREATMENT:  DATE: EVAL  issue HEP and FOTO report   PATIENT EDUCATION: Education details: POC Explanation of findings, FOTO report, issue initial HEP isometric Person educated: Patient Education method: Explanation, Demonstration, Tactile cues, Verbal cues, and Handouts Education comprehension: verbalized understanding, returned demonstration, verbal cues required, tactile cues required, and needs further education  HOME EXERCISE PROGRAM: Access Code: [email or web address] URL:  [email or web address] Date: 05/03/2023 Prepared by: Garen Lah  Exercises - Isometric Shoulder Abduction at Wall  - 1 x daily - 7 x weekly - 3 sets - 10 reps - Isometric Shoulder Extension at Wall  - 2 x daily - 7 x weekly - 10 reps - Isometric Shoulder Flexion at Wall  - 2 x daily - 7 x weekly - 2 sets - 10 reps - Standing Isometric Shoulder Internal Rotation at Doorway  - 2 x daily - 7 x weekly - 2 sets - 10 reps - Standing Isometric Shoulder External Rotation with Doorway  - 2 x daily - 7 x weekly - 2 sets - 10 reps  ASSESSMENT:  CLINICAL IMPRESSION: Patient is a 62 y.o. female who was seen today for physical therapy evaluation and treatment for 2 weeks s/p reverse total shoulder arthroplasty. L TSA on 04/16/23 by Dr Rise Paganini. Pt demonstrates decreased Left shoulder AROM, strength and also some decreased AROM of R shoulder from previous surgery. She does not complain of pain on entering clinic but does have discomfort and  pain and difficulty with ADLs/IADLs, driving, sleep quality, and participation in normal daily activities especially yard work which she desires ot return.. She requires skilled PT services at this time to address all relevant deficits and return to PLOF   OBJECTIVE IMPAIRMENTS: decreased activity tolerance, decreased endurance, decreased ROM, decreased strength, impaired UE functional use, postural dysfunction, and pain.   ACTIVITY LIMITATIONS: carrying, lifting, sleeping, bathing, dressing, reach over head, and hygiene/grooming  PARTICIPATION LIMITATIONS: meal prep, cleaning, laundry, driving, shopping, community activity, and yard work  PERSONAL FACTORS: allergies, asthma, anemia, fibromyalgia, GERD, HLD, HTN, hypothyroidism, R TSA, bil TKA, basal cell face CA, R RTC before R TSA, bunion on R foot, hernia repair are also affecting patient's functional outcome.   REHAB POTENTIAL: Good  CLINICAL DECISION MAKING: Evolving/moderate  complexity  EVALUATION COMPLEXITY: Moderate   GOALS: Goals reviewed with patient? Yes  SHORT TERM GOALS: Target date: 05-31-23  Pt will be independent with initial HEP Baseline: Goal status: INITIAL  2.  Pt pain level will not be greater than 4/10 with active motion Baseline: Pt post surgery 2 weeks, only isometric ex Goal status: INITIAL  3.  Pt will achieve 90 degrees flexion  AROM Baseline: NT due to post surgery eval Goal status: INITIAL    LONG TERM GOALS: Target date: 06-28-23  Pt will be independent with advanced HEP Baseline: needs reinforcement has had R TSR reverse Goal status: INITIAL  2.  FOTO will improve from 32%  to   54% indicating improved functional mobility.  Baseline: eval 32% Goal status: INITIAL  3.  Left shoulder IR and ER will return to [facility] to return to 2/10 or less NPS for as dressing and grooming Baseline: Pt unable to dress without compensation and limited motion Goal status: INITIAL  4.  Pt will be able to sleep on Left side without awaking due to pain Baseline: Pt post surgery 04-16-23 avoided lying on left side but now initial discomfort with lying on left side  Goal status: INITIAL  5.  Left  shoulder AROM scaption will  improve to minimally 0-140 degrees for improved overhead reaching Baseline: SEE AROM chart Goal status: INITIAL  6.  Patient will demonstrate ability to push/pull at least 40# with <3/10 pain in order to return to heavy household and yardwork tasks.  Baseline: post op 04-16-23, only light weight such as coffee cup Goal status: INITIAL  PLAN:  PT FREQUENCY: 2x/week  PT DURATION: 8 weeks  PLANNED INTERVENTIONS: 97164- PT Re-evaluation, 97110-Therapeutic exercises, 97530- Therapeutic activity, 97112- Neuromuscular re-education, 97535- Self Care, 60454- Manual therapy, 97014- Electrical stimulation (unattended), [phone]- Electrical stimulation (manual), Patient/Family education, Taping, Dry Needling, Joint mobilization,  Cryotherapy, and Moist heat  PLAN FOR NEXT SESSION: Progression post op TSR reverse, isometrics and will get increase for AROM at MD in 2 week ( 4 week post op appt)  manual and pain modulation activities as needed   Garen Lah, PT, [facility] Certified Exercise Expert for the Aging Adult  05/03/23 12:44 PM Phone: [phone] Fax: [phone]   For all possible CPT codes, reference the Planned Interventions line above.     Check all conditions that are expected to impact treatment: {Conditions expected to impact treatment:Musculoskeletal disorders   If treatment provided at initial evaluation, no treatment charged due to lack of authorization.

## 2023-05-03 ENCOUNTER — Encounter: Payer: Self-pay | Admitting: Physical Therapy

## 2023-05-03 ENCOUNTER — Ambulatory Visit: Payer: Medicaid Other | Attending: Surgical | Admitting: Physical Therapy

## 2023-05-03 ENCOUNTER — Other Ambulatory Visit: Payer: Self-pay

## 2023-05-03 DIAGNOSIS — M6281 Muscle weakness (generalized): Secondary | ICD-10-CM | POA: Insufficient documentation

## 2023-05-03 DIAGNOSIS — Z96612 Presence of left artificial shoulder joint: Secondary | ICD-10-CM | POA: Diagnosis not present

## 2023-05-03 DIAGNOSIS — M25512 Pain in left shoulder: Secondary | ICD-10-CM | POA: Diagnosis present

## 2023-05-07 ENCOUNTER — Other Ambulatory Visit: Payer: Self-pay | Admitting: Family Medicine

## 2023-05-07 DIAGNOSIS — Z760 Encounter for issue of repeat prescription: Secondary | ICD-10-CM

## 2023-05-07 NOTE — Telephone Encounter (Signed)
Denial letter/appeal received. Faxed & mailed LMN and Op Note. Scanned in media

## 2023-05-08 ENCOUNTER — Ambulatory Visit: Payer: Medicaid Other | Admitting: Physical Therapy

## 2023-05-08 ENCOUNTER — Encounter: Payer: Self-pay | Admitting: Physical Therapy

## 2023-05-08 DIAGNOSIS — M6281 Muscle weakness (generalized): Secondary | ICD-10-CM

## 2023-05-08 DIAGNOSIS — M25512 Pain in left shoulder: Secondary | ICD-10-CM | POA: Diagnosis not present

## 2023-05-08 NOTE — Therapy (Signed)
OUTPATIENT PHYSICAL THERAPY SHOULDER TREATMENT   Patient Name: Denise Morales MRN: [id number] DOB:18-Mar-1961, 62 y.o., female Today's Date: 05/08/2023  END OF SESSION:  PT End of Session - 05/08/23 1015     Visit Number 2    Number of Visits 17    Date for PT Re-Evaluation 06/28/23    Authorization Type [employer]    Authorization Time Period 05/07/23-08/05/23    Authorization - Visit Number 1    Authorization - Number of Visits 15    PT Start Time 1015    PT Stop Time 1103    PT Time Calculation (min) 48 min             Past Medical History:  Diagnosis Date   Allergy    Anemia    Anxiety    Arthritis    Depression    Fibromyalgia    GERD (gastroesophageal reflux disease)    Heart murmur    History of hiatal hernia    Hyperlipidemia    Hypertension    Hypothyroidism    Melanoma (HCC)    face   Past Surgical History:  Procedure Laterality Date   ABDOMINAL HYSTERECTOMY  1985   AIKEN OSTEOTOMY Right 06/05/2022   Procedure: Quintella Reichert OSTEOTOMY;  Surgeon: Candelaria Stagers, DPM;  Location: [facility];  Service: Podiatry;  Laterality: Right;   BICEPT TENODESIS Right 12/05/2022   Procedure: RIGHT BICEPS TENODESIS;  Surgeon: Cammy Copa, MD;  Location: [facility] OR;  Service: Orthopedics;  Laterality: Right;   CAPSULOTOMY METATARSOPHALANGEAL Right 06/05/2022   Procedure: CAPSULOTOMY METATARSOPHALANGEAL;  Surgeon: Candelaria Stagers, DPM;  Location: [facility];  Service: Podiatry;  Laterality: Right;   CHOLECYSTECTOMY     COLONOSCOPY     HALLUX VALGUS LAPIDUS Right 06/05/2022   Procedure: HALLUX VALGUS LAPIDUS;  Surgeon: Candelaria Stagers, DPM;  Location:  [place];  Service: Podiatry;  Laterality: Right;   HAMMER TOE SURGERY Right 06/05/2022   Procedure: HAMMER TOE CORRECTION;  Surgeon: Candelaria Stagers, DPM;  Location: [facility] ;  Service: Podiatry;  Laterality: Right;   REVERSE SHOULDER ARTHROPLASTY Right  12/05/2022   Procedure: RIGHT REVERSE SHOULDER ARTHROPLASTY;  Surgeon: Cammy Copa, MD;  Location: [facility] OR;  Service: Orthopedics;  Laterality: Right;   REVERSE SHOULDER ARTHROPLASTY Left 04/16/2023   Procedure: REVERSE SHOULDER ARTHROPLASTY;  Surgeon: Cammy Copa, MD;  Location: [facility] OR;  Service: Orthopedics;  Laterality: Left;   SHOULDER ARTHROSCOPY WITH ROTATOR CUFF REPAIR AND SUBACROMIAL DECOMPRESSION Right 08/12/2020   Procedure: RIGHT SHOULDER ARTHROSCOPY WITH ROTATOR CUFF REPAIR AND SUBACROMIAL DECOMPRESSION;  Surgeon: Kathryne Hitch, MD;  Location: [facility];  Service: Orthopedics;  Laterality: Right;   TOTAL KNEE ARTHROPLASTY Right 12/24/2020   Procedure: RIGHT TOTAL KNEE ARTHROPLASTY;  Surgeon: Kathryne Hitch, MD;  Location: [facility];  Service: Orthopedics;  Laterality: Right;   TOTAL KNEE ARTHROPLASTY Left 07/15/2021   Procedure: LEFT TOTAL KNEE ARTHROPLASTY;  Surgeon: Kathryne Hitch, MD;  Location: [facility];  Service: Orthopedics;  Laterality: Left;   XI ROBOTIC ASSISTED PARAESOPHAGEAL HERNIA REPAIR N/A 03/12/2023   Procedure: XI ROBOTIC ASSISTED PARAESOPHAGEAL HERNIA REPAIR WITH FUNDOPLICATION;  Surgeon: Berna Bue, MD;  Location: [facility];  Service: General;  Laterality: N/A;  180   Patient Active Problem List   Diagnosis Date Noted   Arthritis of left shoulder region 04/24/2023   Biceps tendonitis, left 04/24/2023   S/P reverse total shoulder arthroplasty, left 04/16/2023  Depression, recurrent (HCC) 04/03/2023   S/P repair of paraesophageal hernia 03/12/2023   Arthritis of right shoulder region 12/24/2022   Biceps tendonitis on right 12/24/2022   S/P reverse total shoulder arthroplasty, right 12/05/2022   Vertigo 11/27/2022   Iron deficiency anemia 10/30/2022   Physical exam, annual 10/30/2022   Medication management 10/30/2022   Class 2 severe obesity due to excess calories with serious comorbidity in adult,  unspecified BMI (HCC) 10/23/2022   Essential hypertension 10/23/2022   Prediabetes 10/23/2022   Hypothyroidism 10/23/2022   Insomnia 06/14/2022   Status post total left knee replacement 07/15/2021   Neoplasm of uncertain behavior of skin 05/25/2021   Generalized osteoarthritis of multiple sites 05/25/2021   Fibromyalgia syndrome 05/25/2021   Restless legs 03/16/2021   Hyperlipidemia 03/16/2021   Anxiety 03/16/2021   Status post right knee replacement 12/24/2020   Unilateral primary osteoarthritis, left knee 11/11/2020   Unilateral primary osteoarthritis, right knee 11/11/2020   Complete tear of right rotator cuff 08/12/2020   Hypertensive disorder 05/05/2019    PCP: Park Meo, FNP   REFERRING PROVIDER: Park Meo, FNP   REFERRING DIAG: [phone] (ICD-10-CM) - History of arthroplasty of left shoulder   THERAPY DIAG:  Acute pain of left shoulder  Muscle weakness (generalized)  Rationale for Evaluation and Treatment: Rehabilitation  ONSET DATE: L  reverseTSA   on 04/16/23  SUBJECTIVE:                                                                                                                                                                                      SUBJECTIVE STATEMENT:  My pain is an ache. I have been doing the exercises.   EVAL: I have been using CPM machine and doing light activities in the home like washing cups and not trying to carry anything heavy, L TSA on 10/21/ 24 by Dr August Saucer.  MD as DC sling. I feel like it is a toothache pain last night. I would like to be able to return to yard work and my mud room. I am having to maneuver clothes so I can try to dress myself.  I have been here before with Lelon Mast last time  Hand dominance: Right  PERTINENT HISTORY: allergies, asthma, anemia, fibromyalgia, GERD, HLD, HTN, hypothyroidism, R TSA, bil TKA, basal cell face CA, R RTC before R TSA, bunion on R foot, hernia repair,    PAIN:  Are you having  pain? Yes: NPRS scale: at rest 3-4/10 Pain location: Left shoulder Pain description: achy Aggravating factors: lying on shoulder initially hurts but then adjusts.  Cannot drive,  I can shower but I assist my left arm  with my right, I compensate and tie my bra in the front to dress.  Relieving factors: Resting Toothache pain  PRECAUTIONS: Plan is discontinue sling. Okay to very lightly lift with the operative extremity but no lifting anything heavier than a coffee cup or cell phone. Start physical therapy to focus on PROM and AROM with deltoid isometrics. Do not want to externally rotate past 30 degrees to protect subscapularis repair. Follow-up in 4 weeks for clinical recheck with Dr. August Saucer.   RED FLAGS: None   WEIGHT BEARING RESTRICTIONS: Yes No weight bearing until released for AROM next visit  FALLS:  Has patient fallen in last 6 months?   YES  slipped on deck steps but no issues other than that time.  LIVING ENVIRONMENT: Lives with: lives with their family and including dtr SIL and grandkids Lives in: House/apartment Stairs: Yes: External: 5 steps; can reach both Has following equipment at home: Single point cane, Walker - 2 wheeled, Wheelchair (manual), Shower bench, and bed side commode  OCCUPATION: Retired in Community education officer   PLOF: Independent  PATIENT GOALS:To use my arm with out limitation  NEXT MD VISIT:  in 2 weeks  (05/28/23)  OBJECTIVE:  Note: Objective measures were completed at Evaluation unless otherwise noted.  DIAGNOSTIC FINDINGS:  04-27-23 AP, scapular Y, axillary views of the shoulder reviewed.  Reverse shoulder  arthroplasty prosthesis in good position and alignment without any  complicating features.  There is no evidence of periprosthetic fracture,  dislocation, dissociation of the glenosphere.   PATIENT SURVEYS:  FOTO 32%  predicted 54%  COGNITION: Overall cognitive status: Within functional limits for tasks  assessed     SENSATION: WFL  POSTURE: Rounded shoulders  Right shoulder more forward than Left  UPPER EXTREMITY ROM:   Active ROM Right eval Left eval Left 05/08/23  Shoulder flexion 131/ PROM 145 NT to surgery 04-16-23 PROM 85  Shoulder extension 125/ PROM 142    Shoulder abduction   PROM 60  Shoulder adduction     Shoulder internal rotation     Shoulder external rotation   PROM 20  Elbow flexion     Elbow extension     Wrist flexion     Wrist extension     Wrist ulnar deviation     Wrist radial deviation     Wrist pronation     Wrist supination     (Blank rows = not tested)  UPPER EXTREMITY MMT:not formally assess at eval d/t ROM deficits and post-op status   MMT Right eval Left eval  Shoulder flexion  NT due to recent TSA surgery < 2 weeks  Shoulder extension    Shoulder abduction    Shoulder adduction    Shoulder internal rotation    Shoulder external rotation    Middle trapezius    Lower trapezius    Elbow flexion    Elbow extension    Wrist flexion    Wrist extension    Wrist ulnar deviation    Wrist radial deviation    Wrist pronation    Wrist supination    Grip strength (lbs)    (Blank rows = not tested)  SHOULDER SPECIAL TESTS: NT due to L TSA reverse  JOINT MOBILITY TESTING:  NT  PALPATION:  Pt with steri strips on surgical site well intact   TODAY'S TREATMENT   [facility] Adult PT Treatment:  DATE: 05/08/23 Therapeutic Exercise: Pendulums before and in between isometrics  Review of isometric HEP all planes 10 reps each 3-5 sec hold , cues for sub max pressure and  Supine elbow flexion x 10 Supine wrist AROM x 10  Supine Towel Grip 5 sec x 10  Manual Therapy: PROM flexion, abduction, ER  STW left deltoid  Modalities: Ice pack at end of session 10 minutes left shoulder   TREATMENT:                                                                                                                                          DATE: EVAL  issue HEP and FOTO report   PATIENT EDUCATION: Education details: POC Explanation of findings, FOTO report, issue initial HEP isometric Person educated: Patient Education method: Explanation, Demonstration, Tactile cues, Verbal cues, and Handouts Education comprehension: verbalized understanding, returned demonstration, verbal cues required, tactile cues required, and needs further education  HOME EXERCISE PROGRAM: Access Code: [email or web address] URL: [email or web address] Date: 05/03/2023 Prepared by: Garen Lah  Exercises - Isometric Shoulder Abduction at Wall  - 1 x daily - 7 x weekly - 3 sets - 10 reps - Isometric Shoulder Extension at Wall  - 2 x daily - 7 x weekly - 10 reps - Isometric Shoulder Flexion at Wall  - 2 x daily - 7 x weekly - 2 sets - 10 reps - Standing Isometric Shoulder Internal Rotation at Doorway  - 2 x daily - 7 x weekly - 2 sets - 10 reps - Standing Isometric Shoulder External Rotation with Doorway  - 2 x daily - 7 x weekly - 2 sets - 10 reps  ASSESSMENT:  CLINICAL IMPRESSION: Pt is 2 weeks post op. Pt reports compliance with isometric HEP, requires mod cues for correct execution. PROM flexion 85, ER 20 without increased pain, only stretching reported. Ice pack at end of session as pt is not going straight home. Otherwise, she has an ice machine at home.    EVAL: Patient is a 62 y.o. female who was seen today for physical therapy evaluation and treatment for 2 weeks s/p reverse total shoulder arthroplasty. L TSA on 04/16/23 by Dr Rise Paganini. Pt demonstrates decreased Left shoulder AROM, strength and also some decreased AROM of R shoulder from previous surgery. She does not complain of pain on entering clinic but does have discomfort and  pain and difficulty with ADLs/IADLs, driving, sleep quality, and participation in normal daily activities especially yard work which she desires ot return.. She requires skilled  PT services at this time to address all relevant deficits and return to PLOF   OBJECTIVE IMPAIRMENTS: decreased activity tolerance, decreased endurance, decreased ROM, decreased strength, impaired UE functional use, postural dysfunction, and pain.   ACTIVITY LIMITATIONS: carrying, lifting, sleeping, bathing, dressing, reach over head, and hygiene/grooming  PARTICIPATION LIMITATIONS: meal prep, cleaning, laundry, driving, shopping, community activity, and yard  work  PERSONAL FACTORS: allergies, asthma, anemia, fibromyalgia, GERD, HLD, HTN, hypothyroidism, R TSA, bil TKA, basal cell face CA, R RTC before R TSA, bunion on R foot, hernia repair are also affecting patient's functional outcome.   REHAB POTENTIAL: Good  CLINICAL DECISION MAKING: Evolving/moderate complexity  EVALUATION COMPLEXITY: Moderate   GOALS: Goals reviewed with patient? Yes  SHORT TERM GOALS: Target date: 05-31-23  Pt will be independent with initial HEP Baseline: Goal status: INITIAL  2.  Pt pain level will not be greater than 4/10 with active motion Baseline: Pt post surgery 2 weeks, only isometric ex Goal status: INITIAL  3.  Pt will achieve 90 degrees flexion  AROM Baseline: NT due to post surgery eval Goal status: INITIAL    LONG TERM GOALS: Target date: 06-28-23  Pt will be independent with advanced HEP Baseline: needs reinforcement has had R TSR reverse Goal status: INITIAL  2.  FOTO will improve from 32%  to   54% indicating improved functional mobility.  Baseline: eval 32% Goal status: INITIAL  3.  Left shoulder IR and ER will return to [facility] to return to 2/10 or less NPS for as dressing and grooming Baseline: Pt unable to dress without compensation and limited motion Goal status: INITIAL  4.  Pt will be able to sleep on Left side without awaking due to pain Baseline: Pt post surgery 04-16-23 avoided lying on left side but now initial discomfort with lying on left side  Goal status:  INITIAL  5.  Left  shoulder AROM scaption will improve to minimally 0-140 degrees for improved overhead reaching Baseline: SEE AROM chart Goal status: INITIAL  6.  Patient will demonstrate ability to push/pull at least 40# with <3/10 pain in order to return to heavy household and yardwork tasks.  Baseline: post op 04-16-23, only light weight such as coffee cup Goal status: INITIAL  PLAN:  PT FREQUENCY: 2x/week  PT DURATION: 8 weeks  PLANNED INTERVENTIONS: 97164- PT Re-evaluation, 97110-Therapeutic exercises, 97530- Therapeutic activity, 97112- Neuromuscular re-education, 97535- Self Care, 16109- Manual therapy, 97014- Electrical stimulation (unattended), [phone]- Electrical stimulation (manual), Patient/Family education, Taping, Dry Needling, Joint mobilization, Cryotherapy, and Moist heat  PLAN FOR NEXT SESSION: Progression post op TSR reverse, isometrics and will get increase for AROM at MD in 2 week ( 4 week post op appt)  manual and pain modulation activities as needed   Jannette Spanner, PTA 05/08/23 10:52 AM Phone: [phone] Fax: [phone]   For all possible CPT codes, reference the Planned Interventions line above.     Check all conditions that are expected to impact treatment: {Conditions expected to impact treatment:Musculoskeletal disorders   If treatment provided at initial evaluation, no treatment charged due to lack of authorization.

## 2023-05-10 ENCOUNTER — Ambulatory Visit: Payer: Medicaid Other | Admitting: Physical Therapy

## 2023-05-10 ENCOUNTER — Other Ambulatory Visit: Payer: Self-pay

## 2023-05-10 ENCOUNTER — Encounter: Payer: Self-pay | Admitting: Physical Therapy

## 2023-05-10 DIAGNOSIS — M25512 Pain in left shoulder: Secondary | ICD-10-CM

## 2023-05-10 DIAGNOSIS — M6281 Muscle weakness (generalized): Secondary | ICD-10-CM

## 2023-05-10 NOTE — Therapy (Signed)
OUTPATIENT PHYSICAL THERAPY SHOULDER TREATMENT   Patient Name: Denise Morales MRN: [id number] DOB:1961-05-16, 62 y.o., female Today's Date: 05/10/2023   END OF SESSION:  PT End of Session - 05/10/23 1115     Visit Number 3    Number of Visits 17    Date for PT Re-Evaluation 06/28/23    Authorization Type [employer]    Authorization Time Period 05/07/23 - 08/05/23    Authorization - Visit Number 2    Authorization - Number of Visits 15    PT Start Time 1100    PT Stop Time 1145    PT Time Calculation (min) 45 min    Activity Tolerance Patient tolerated treatment well    Behavior During Therapy WFL for tasks assessed/performed              Past Medical History:  Diagnosis Date   Allergy    Anemia    Anxiety    Arthritis    Depression    Fibromyalgia    GERD (gastroesophageal reflux disease)    Heart murmur    History of hiatal hernia    Hyperlipidemia    Hypertension    Hypothyroidism    Melanoma (HCC)    face   Past Surgical History:  Procedure Laterality Date   ABDOMINAL HYSTERECTOMY  1985   AIKEN OSTEOTOMY Right 06/05/2022   Procedure: Quintella Reichert OSTEOTOMY;  Surgeon: Candelaria Stagers, DPM;  Location: [facility];  Service: Podiatry;  Laterality: Right;   BICEPT TENODESIS Right 12/05/2022   Procedure: RIGHT BICEPS TENODESIS;  Surgeon: Cammy Copa, MD;  Location: [facility] OR;  Service: Orthopedics;  Laterality: Right;   CAPSULOTOMY METATARSOPHALANGEAL Right 06/05/2022   Procedure: CAPSULOTOMY METATARSOPHALANGEAL;  Surgeon: Candelaria Stagers, DPM;  Location: [facility] [place];  Service: Podiatry;  Laterality: Right;   CHOLECYSTECTOMY     COLONOSCOPY     HALLUX VALGUS LAPIDUS Right 06/05/2022   Procedure: HALLUX VALGUS LAPIDUS;  Surgeon: Candelaria Stagers, DPM;  Location:  [place];  Service: Podiatry;  Laterality: Right;   HAMMER TOE SURGERY Right 06/05/2022   Procedure: HAMMER TOE CORRECTION;  Surgeon: Candelaria Stagers,  DPM;  Location: [facility] ;  Service: Podiatry;  Laterality: Right;   REVERSE SHOULDER ARTHROPLASTY Right 12/05/2022   Procedure: RIGHT REVERSE SHOULDER ARTHROPLASTY;  Surgeon: Cammy Copa, MD;  Location: [facility] OR;  Service: Orthopedics;  Laterality: Right;   REVERSE SHOULDER ARTHROPLASTY Left 04/16/2023   Procedure: REVERSE SHOULDER ARTHROPLASTY;  Surgeon: Cammy Copa, MD;  Location: [facility] OR;  Service: Orthopedics;  Laterality: Left;   SHOULDER ARTHROSCOPY WITH ROTATOR CUFF REPAIR AND SUBACROMIAL DECOMPRESSION Right 08/12/2020   Procedure: RIGHT SHOULDER ARTHROSCOPY WITH ROTATOR CUFF REPAIR AND SUBACROMIAL DECOMPRESSION;  Surgeon: Kathryne Hitch, MD;  Location: [facility];  Service: Orthopedics;  Laterality: Right;   TOTAL KNEE ARTHROPLASTY Right 12/24/2020   Procedure: RIGHT TOTAL KNEE ARTHROPLASTY;  Surgeon: Kathryne Hitch, MD;  Location: [facility];  Service: Orthopedics;  Laterality: Right;   TOTAL KNEE ARTHROPLASTY Left 07/15/2021   Procedure: LEFT TOTAL KNEE ARTHROPLASTY;  Surgeon: Kathryne Hitch, MD;  Location: [facility];  Service: Orthopedics;  Laterality: Left;   XI ROBOTIC ASSISTED PARAESOPHAGEAL HERNIA REPAIR N/A 03/12/2023   Procedure: XI ROBOTIC ASSISTED PARAESOPHAGEAL HERNIA REPAIR WITH FUNDOPLICATION;  Surgeon: Berna Bue, MD;  Location: [facility];  Service: General;  Laterality: N/A;  180   Patient Active Problem List   Diagnosis Date Noted  Arthritis of left shoulder region 04/24/2023   Biceps tendonitis, left 04/24/2023   S/P reverse total shoulder arthroplasty, left 04/16/2023   Depression, recurrent (HCC) 04/03/2023   S/P repair of paraesophageal hernia 03/12/2023   Arthritis of right shoulder region 12/24/2022   Biceps tendonitis on right 12/24/2022   S/P reverse total shoulder arthroplasty, right 12/05/2022   Vertigo 11/27/2022   Iron deficiency anemia 10/30/2022   Physical exam, annual 10/30/2022    Medication management 10/30/2022   Class 2 severe obesity due to excess calories with serious comorbidity in adult, unspecified BMI (HCC) 10/23/2022   Essential hypertension 10/23/2022   Prediabetes 10/23/2022   Hypothyroidism 10/23/2022   Insomnia 06/14/2022   Status post total left knee replacement 07/15/2021   Neoplasm of uncertain behavior of skin 05/25/2021   Generalized osteoarthritis of multiple sites 05/25/2021   Fibromyalgia syndrome 05/25/2021   Restless legs 03/16/2021   Hyperlipidemia 03/16/2021   Anxiety 03/16/2021   Status post right knee replacement 12/24/2020   Unilateral primary osteoarthritis, left knee 11/11/2020   Unilateral primary osteoarthritis, right knee 11/11/2020   Complete tear of right rotator cuff 08/12/2020   Hypertensive disorder 05/05/2019    PCP: Park Meo, FNP   REFERRING PROVIDER: Park Meo, FNP   REFERRING DIAG: [phone] (ICD-10-CM) - History of arthroplasty of left shoulder   THERAPY DIAG:  Acute pain of left shoulder  Muscle weakness (generalized)  Rationale for Evaluation and Treatment: Rehabilitation  ONSET DATE: L  reverseTSA   on 04/16/23   SUBJECTIVE:                                                                                                                                                                                     SUBJECTIVE STATEMENT:  Patient arrives to therapy reporting no pain of left shoulder.   EVAL: I have been using CPM machine and doing light activities in the home like washing cups and not trying to carry anything heavy, L TSA on 10/21/ 24 by Dr August Saucer.  MD as DC sling. I feel like it is a toothache pain last night. I would like to be able to return to yard work and my mud room. I am having to maneuver clothes so I can try to dress myself.  I have been here before with Lelon Mast last time  PERTINENT HISTORY: allergies, asthma, anemia, fibromyalgia, GERD, HLD, HTN, hypothyroidism, R TSA, bil TKA,  basal cell face CA, R RTC before R TSA, bunion on R foot, hernia repair,    PAIN:  Are you having pain? NPRS scale: 0/10 Pain location: Left shoulder Pain description: achy Aggravating factors: lying on shoulder initially hurts but  then adjusts.  Cannot drive,  I can shower but I assist my left arm with my right, I compensate and tie my bra in the front to dress.  Relieving factors: Resting Toothache pain  PRECAUTIONS: Plan is discontinue sling. Okay to very lightly lift with the operative extremity but no lifting anything heavier than a coffee cup or cell phone. Start physical therapy to focus on PROM and AROM with deltoid isometrics. Do not want to externally rotate past 30 degrees to protect subscapularis repair. Follow-up in 4 weeks for clinical recheck with Dr. August Saucer.   RED FLAGS: None   WEIGHT BEARING RESTRICTIONS: Yes No weight bearing until released for AROM next visit  FALLS:  Has patient fallen in last 6 months?   YES  slipped on deck steps but no issues other than that time.  LIVING ENVIRONMENT: Lives with: lives with their family and including dtr SIL and grandkids Lives in: House/apartment Stairs: Yes: External: 5 steps; can reach both Has following equipment at home: Single point cane, Walker - 2 wheeled, Wheelchair (manual), Shower bench, and bed side commode  OCCUPATION: Retired in Community education officer   PLOF: Independent  PATIENT GOALS:To use my arm with out limitation   OBJECTIVE:  Note: Objective measures were completed at Evaluation unless otherwise noted. PATIENT SURVEYS:  FOTO 32%  predicted 54%  COGNITION: Overall cognitive status: Within functional limits for tasks assessed     SENSATION: WFL  POSTURE: Rounded shoulders  Right shoulder more forward than Left  UPPER EXTREMITY ROM:   Active ROM Right eval Left eval Left 05/08/23 Left 05/10/2023  Shoulder flexion 131/ PROM 145 NT to surgery 04-16-23 PROM 85 100 PROM  Shoulder extension 125/ PROM 142      Shoulder abduction   PROM 60   Shoulder adduction      Shoulder internal rotation      Shoulder external rotation   PROM 20 30 PROM  Elbow flexion      Elbow extension      Wrist flexion      Wrist extension      Wrist ulnar deviation      Wrist radial deviation      Wrist pronation      Wrist supination      (Blank rows = not tested)  UPPER EXTREMITY MMT:not formally assess at eval d/t ROM deficits and post-op status   MMT Right eval Left eval  Shoulder flexion  NT due to recent TSA surgery < 2 weeks  Shoulder extension    Shoulder abduction    Shoulder adduction    Shoulder internal rotation    Shoulder external rotation    Middle trapezius    Lower trapezius    Elbow flexion    Elbow extension    Wrist flexion    Wrist extension    Wrist ulnar deviation    Wrist radial deviation    Wrist pronation    Wrist supination    Grip strength (lbs)    (Blank rows = not tested)  SHOULDER SPECIAL TESTS: NT due to L TSA reverse  JOINT MOBILITY TESTING:  NT  PALPATION:  Pt with steri strips on surgical site well intact    TODAY'S TREATMENT [facility] Adult PT Treatment:  DATE: 05/10/2023 Therapeutic Exercise: Supine deltoid isometrics 10 x 5 sec each Supine forward punch 10 x 5 sec Supine small shoulder circles x 10 cw/ccw x 3 Sidelying shoulder abduction to 90 deg 2 x 10 Seated table slide with FR 2 x 10 with 5 sec hold Seated shoulder blade squeezes with hands grasped in front to maintain elbow in front of midline 10 x 5 sec Manual Therapy: Left shoulder PROM shoulder elevation and ER   [facility] Adult PT Treatment:                                                DATE: 05/08/23 Therapeutic Exercise: Pendulums before and in between isometrics  Review of isometric HEP all planes 10 reps each 3-5 sec hold , cues for sub max pressure and  Supine elbow flexion x 10 Supine wrist AROM x 10  Supine Towel Grip 5 sec x 10  Manual  Therapy: PROM flexion, abduction, ER  STW left deltoid Modalities: Ice pack at end of session 10 minutes left shoulder  PATIENT EDUCATION: Education details: HEP Person educated: Patient Education method: Programmer, multimedia, Demonstration, Tactile cues, Verbal cues, and Handouts Education comprehension: verbalized understanding, returned demonstration, verbal cues required, tactile cues required, and needs further education  HOME EXERCISE PROGRAM: Access Code: [email or web address]   ASSESSMENT: CLINICAL IMPRESSION: Patient tolerated therapy well with no adverse effects. Therapy continued focus on progressing left shoulder passive motion and progression deltoid strength and active motion with good tolerance. She did report left shoulder pain up to 2-3/10 with exercise. She demonstrates improvement in left shoulder passive elevation and ER and was able to progress with active shoulder elevation and abduction. Updated her HEP to progress shoulder motion. Patient would benefit from continued skilled PT to progress her mobility and strength in order to maximize her functional ability.   EVAL: Patient is a 62 y.o. female who was seen today for physical therapy evaluation and treatment for 2 weeks s/p reverse total shoulder arthroplasty. L TSA on 04/16/23 by Dr Rise Paganini. Pt demonstrates decreased Left shoulder AROM, strength and also some decreased AROM of R shoulder from previous surgery. She does not complain of pain on entering clinic but does have discomfort and  pain and difficulty with ADLs/IADLs, driving, sleep quality, and participation in normal daily activities especially yard work which she desires ot return.. She requires skilled PT services at this time to address all relevant deficits and return to PLOF   OBJECTIVE IMPAIRMENTS: decreased activity tolerance, decreased endurance, decreased ROM, decreased strength, impaired UE functional use, postural dysfunction, and pain.   ACTIVITY LIMITATIONS:  carrying, lifting, sleeping, bathing, dressing, reach over head, and hygiene/grooming  PARTICIPATION LIMITATIONS: meal prep, cleaning, laundry, driving, shopping, community activity, and yard work  PERSONAL FACTORS: allergies, asthma, anemia, fibromyalgia, GERD, HLD, HTN, hypothyroidism, R TSA, bil TKA, basal cell face CA, R RTC before R TSA, bunion on R foot, hernia repair are also affecting patient's functional outcome.    GOALS: Goals reviewed with patient? Yes  SHORT TERM GOALS: Target date: 05-31-23  Pt will be independent with initial HEP Baseline: Goal status: INITIAL  2.  Pt pain level will not be greater than 4/10 with active motion Baseline: Pt post surgery 2 weeks, only isometric ex Goal status: INITIAL  3.  Pt will achieve 90 degrees flexion  AROM Baseline: NT due to post  surgery eval Goal status: INITIAL  LONG TERM GOALS: Target date: 06-28-23  Pt will be independent with advanced HEP Baseline: needs reinforcement has had R TSR reverse Goal status: INITIAL  2.  FOTO will improve from 32%  to   54% indicating improved functional mobility.  Baseline: eval 32% Goal status: INITIAL  3.  Left shoulder IR and ER will return to [facility] to return to 2/10 or less NPS for as dressing and grooming Baseline: Pt unable to dress without compensation and limited motion Goal status: INITIAL  4.  Pt will be able to sleep on Left side without awaking due to pain Baseline: Pt post surgery 04-16-23 avoided lying on left side but now initial discomfort with lying on left side  Goal status: INITIAL  5.  Left  shoulder AROM scaption will improve to minimally 0-140 degrees for improved overhead reaching Baseline: SEE AROM chart Goal status: INITIAL  6.  Patient will demonstrate ability to push/pull at least 40# with <3/10 pain in order to return to heavy household and yardwork tasks.  Baseline: post op 04-16-23, only light weight such as coffee cup Goal status: INITIAL   PLAN: PT  FREQUENCY: 2x/week  PT DURATION: 8 weeks  PLANNED INTERVENTIONS: 97164- PT Re-evaluation, 97110-Therapeutic exercises, 97530- Therapeutic activity, 97112- Neuromuscular re-education, 97535- Self Care, 16109- Manual therapy, 97014- Electrical stimulation (unattended), [id number]- Electrical stimulation (manual), Patient/Family education, Taping, Dry Needling, Joint mobilization, Cryotherapy, and Moist heat  PLAN FOR NEXT SESSION: Progression post op TSR reverse, isometrics and will get increase for AROM at MD in 2 week ( 4 week post op appt)  manual and pain modulation activities as needed   Rosana Hoes, PT, DPT, LAT, ATC 05/10/23  1:13 PM Phone: [phone] Fax: [phone]

## 2023-05-10 NOTE — Patient Instructions (Signed)
Access Code: [email or web address] URL: [email or web address] Date: 05/10/2023 Prepared by: Rosana Hoes  Exercises - Isometric Shoulder Abduction at Wall  - 1 x daily - 2 sets - 10 reps - 5 seconds hold - Isometric Shoulder Extension at Wall  - 1 x daily - 10 reps - 5 seconds hold - Isometric Shoulder Flexion at Wall  - 1 x daily - 2 sets - 10 reps - 5 seconds hold - Seated Shoulder Flexion Towel Slide at Table Top  - 1 x daily - 2 sets - 10 reps - 5 seconds hold - Supine Shoulder Press  - 1 x daily - 2 sets - 10 reps - 5 seconds hold - Sidelying Shoulder Abduction  - 1 x daily - 2 sets - 10 reps

## 2023-05-16 ENCOUNTER — Ambulatory Visit: Payer: Medicaid Other | Admitting: Physical Therapy

## 2023-05-16 ENCOUNTER — Encounter: Payer: Self-pay | Admitting: Physical Therapy

## 2023-05-16 ENCOUNTER — Other Ambulatory Visit: Payer: Self-pay

## 2023-05-16 DIAGNOSIS — M6281 Muscle weakness (generalized): Secondary | ICD-10-CM

## 2023-05-16 DIAGNOSIS — M25512 Pain in left shoulder: Secondary | ICD-10-CM

## 2023-05-16 NOTE — Therapy (Signed)
OUTPATIENT PHYSICAL THERAPY SHOULDER TREATMENT   Patient Name: Denise Morales MRN: [id number] DOB:1961-03-10, 62 y.o., female Today's Date: 05/16/2023   END OF SESSION:  PT End of Session - 05/16/23 1526     Visit Number 4    Number of Visits 17    Date for PT Re-Evaluation 06/28/23    Authorization Type [employer]    Authorization Time Period 05/07/23 - 08/05/23    Authorization - Visit Number 3    Authorization - Number of Visits 15    PT Start Time 1530    PT Stop Time 1610    PT Time Calculation (min) 40 min    Activity Tolerance Patient tolerated treatment well    Behavior During Therapy WFL for tasks assessed/performed               Past Medical History:  Diagnosis Date   Allergy    Anemia    Anxiety    Arthritis    Depression    Fibromyalgia    GERD (gastroesophageal reflux disease)    Heart murmur    History of hiatal hernia    Hyperlipidemia    Hypertension    Hypothyroidism    Melanoma (HCC)    face   Past Surgical History:  Procedure Laterality Date   ABDOMINAL HYSTERECTOMY  1985   AIKEN OSTEOTOMY Right 06/05/2022   Procedure: Quintella Reichert OSTEOTOMY;  Surgeon: Candelaria Stagers, DPM;  Location: [facility];  Service: Podiatry;  Laterality: Right;   BICEPT TENODESIS Right 12/05/2022   Procedure: RIGHT BICEPS TENODESIS;  Surgeon: Cammy Copa, MD;  Location: [facility] OR;  Service: Orthopedics;  Laterality: Right;   CAPSULOTOMY METATARSOPHALANGEAL Right 06/05/2022   Procedure: CAPSULOTOMY METATARSOPHALANGEAL;  Surgeon: Candelaria Stagers, DPM;  Location: [facility];  Service: Podiatry;  Laterality: Right;   CHOLECYSTECTOMY     COLONOSCOPY     HALLUX VALGUS LAPIDUS Right 06/05/2022   Procedure: HALLUX VALGUS LAPIDUS;  Surgeon: Candelaria Stagers, DPM;  Location: [facility];  Service: Podiatry;  Laterality: Right;   HAMMER TOE SURGERY Right 06/05/2022   Procedure: HAMMER TOE CORRECTION;  Surgeon: Candelaria Stagers,  DPM;  Location: [facility];  Service: Podiatry;  Laterality: Right;   REVERSE SHOULDER ARTHROPLASTY Right 12/05/2022   Procedure: RIGHT REVERSE SHOULDER ARTHROPLASTY;  Surgeon: Cammy Copa, MD;  Location: [facility] OR;  Service: Orthopedics;  Laterality: Right;   REVERSE SHOULDER ARTHROPLASTY Left 04/16/2023   Procedure: REVERSE SHOULDER ARTHROPLASTY;  Surgeon: Cammy Copa, MD;  Location: [facility] OR;  Service: Orthopedics;  Laterality: Left;   SHOULDER ARTHROSCOPY WITH ROTATOR CUFF REPAIR AND SUBACROMIAL DECOMPRESSION Right 08/12/2020   Procedure: RIGHT SHOULDER ARTHROSCOPY WITH ROTATOR CUFF REPAIR AND SUBACROMIAL DECOMPRESSION;  Surgeon: Kathryne Hitch, MD;  Location: [facility];  Service: Orthopedics;  Laterality: Right;   TOTAL KNEE ARTHROPLASTY Right 12/24/2020   Procedure: RIGHT TOTAL KNEE ARTHROPLASTY;  Surgeon: Kathryne Hitch, MD;  Location: [facility];  Service: Orthopedics;  Laterality: Right;   TOTAL KNEE ARTHROPLASTY Left 07/15/2021   Procedure: LEFT TOTAL KNEE ARTHROPLASTY;  Surgeon: Kathryne Hitch, MD;  Location: [facility];  Service: Orthopedics;  Laterality: Left;   XI ROBOTIC ASSISTED PARAESOPHAGEAL HERNIA REPAIR N/A 03/12/2023   Procedure: XI ROBOTIC ASSISTED PARAESOPHAGEAL HERNIA REPAIR WITH FUNDOPLICATION;  Surgeon: Berna Bue, MD;  Location: [facility];  Service: General;  Laterality: N/A;  180   Patient Active Problem List   Diagnosis Date  Noted   Arthritis of left shoulder region 04/24/2023   Biceps tendonitis, left 04/24/2023   S/P reverse total shoulder arthroplasty, left 04/16/2023   Depression, recurrent (HCC) 04/03/2023   S/P repair of paraesophageal hernia 03/12/2023   Arthritis of right shoulder region 12/24/2022   Biceps tendonitis on right 12/24/2022   S/P reverse total shoulder arthroplasty, right 12/05/2022   Vertigo 11/27/2022   Iron deficiency anemia 10/30/2022   Physical exam, annual 10/30/2022    Medication management 10/30/2022   Class 2 severe obesity due to excess calories with serious comorbidity in adult, unspecified BMI (HCC) 10/23/2022   Essential hypertension 10/23/2022   Prediabetes 10/23/2022   Hypothyroidism 10/23/2022   Insomnia 06/14/2022   Status post total left knee replacement 07/15/2021   Neoplasm of uncertain behavior of skin 05/25/2021   Generalized osteoarthritis of multiple sites 05/25/2021   Fibromyalgia syndrome 05/25/2021   Restless legs 03/16/2021   Hyperlipidemia 03/16/2021   Anxiety 03/16/2021   Status post right knee replacement 12/24/2020   Unilateral primary osteoarthritis, left knee 11/11/2020   Unilateral primary osteoarthritis, right knee 11/11/2020   Complete tear of right rotator cuff 08/12/2020   Hypertensive disorder 05/05/2019    PCP: Park Meo, FNP   REFERRING PROVIDER: Park Meo, FNP   REFERRING DIAG: [phone] (ICD-10-CM) - History of arthroplasty of left shoulder   THERAPY DIAG:  Acute pain of left shoulder  Muscle weakness (generalized)  Rationale for Evaluation and Treatment: Rehabilitation  ONSET DATE: L reverse TSA on 04/16/23   SUBJECTIVE:                                                                                                                                                                                     SUBJECTIVE STATEMENT:  Patient reports her shoulder is a little achy. She has been repositioning pillows so she can sleep in the bed now.   EVAL: I have been using CPM machine and doing light activities in the home like washing cups and not trying to carry anything heavy, L TSA on 10/21/ 24 by Dr August Saucer.  MD as DC sling. I feel like it is a toothache pain last night. I would like to be able to return to yard work and my mud room. I am having to maneuver clothes so I can try to dress myself.  I have been here before with Lelon Mast last time  PERTINENT HISTORY: allergies, asthma, anemia, fibromyalgia,  GERD, HLD, HTN, hypothyroidism, R TSA, bil TKA, basal cell face CA, R RTC before R TSA, bunion on R foot, hernia repair,    PAIN:  Are you having pain? NPRS scale: 3/10 Pain location: Left  shoulder Pain description: Achy Aggravating factors: lying on shoulder initially hurts but then adjusts.  Cannot drive,  I can shower but I assist my left arm with my right, I compensate and tie my bra in the front to dress.  Relieving factors: Resting Toothache pain  PRECAUTIONS: Plan is discontinue sling. Okay to very lightly lift with the operative extremity but no lifting anything heavier than a coffee cup or cell phone. Start physical therapy to focus on PROM and AROM with deltoid isometrics. Do not want to externally rotate past 30 degrees to protect subscapularis repair. Follow-up in 4 weeks for clinical recheck with Dr. August Saucer.   RED FLAGS: None   WEIGHT BEARING RESTRICTIONS: Yes No weight bearing until released for AROM next visit  FALLS:  Has patient fallen in last 6 months?   YES  slipped on deck steps but no issues other than that time.  LIVING ENVIRONMENT: Lives with: lives with their family and including dtr SIL and grandkids Lives in: House/apartment Stairs: Yes: External: 5 steps; can reach both Has following equipment at home: Single point cane, Walker - 2 wheeled, Wheelchair (manual), Shower bench, and bed side commode  OCCUPATION: Retired in Community education officer   PLOF: Independent  PATIENT GOALS:To use my arm with out limitation   OBJECTIVE:  Note: Objective measures were completed at Evaluation unless otherwise noted. PATIENT SURVEYS:  FOTO 32%  predicted 54%  POSTURE: Rounded shoulders  Right shoulder more forward than Left  UPPER EXTREMITY ROM:   Active ROM Right eval Left eval Left 05/08/23 Left 05/10/2023 Left 05/16/2023  Shoulder flexion 131/ PROM 145 NT to surgery 04-16-23 PROM 85 100 PROM 120 PROM  Shoulder extension 125/ PROM 142      Shoulder abduction   PROM 60     Shoulder adduction       Shoulder internal rotation       Shoulder external rotation   PROM 20 30 PROM   Elbow flexion       Elbow extension       Wrist flexion       Wrist extension       Wrist ulnar deviation       Wrist radial deviation       Wrist pronation       Wrist supination       (Blank rows = not tested)  UPPER EXTREMITY MMT:not formally assess at eval d/t ROM deficits and post-op status   MMT Right eval Left eval  Shoulder flexion  NT due to recent TSA surgery < 2 weeks  Shoulder extension    Shoulder abduction    Shoulder adduction    Shoulder internal rotation    Shoulder external rotation    Middle trapezius    Lower trapezius    Elbow flexion    Elbow extension    Wrist flexion    Wrist extension    Wrist ulnar deviation    Wrist radial deviation    Wrist pronation    Wrist supination    Grip strength (lbs)    (Blank rows = not tested)  SHOULDER SPECIAL TESTS: NT due to L TSA reverse  JOINT MOBILITY TESTING:  NT  PALPATION:  Pt with steri strips on surgical site well intact    TODAY'S TREATMENT [facility] Adult PT Treatment:  DATE: 05/16/2023 Therapeutic Exercise: Supine deltoid isometrics 10 x 5 sec each Supine forward punch 10 x 5 sec Supine small shoulder circles at 90 deg flexion 3 x 10 cw/ccw x 3 Supine short arc flexion AROM 70-120 deg 3 x 10 Sidelying shoulder abduction to 90 deg 3 x 10 Seated shoulder blade squeezes with hands grasped in front to maintain elbow in front of midline 2 x 10 with 5 sec hold Seated shoulder abduction with elbow bent 2 x 10 Standing shoulder isometrics at wall flexion, abduction, extension 10 x 5 sec each Prone shoulder extension to hip 3 x 10 Seated table slide shoulder elevation with FR 2 x 10 Manual Therapy: Left shoulder PROM shoulder elevation   [facility] Adult PT Treatment:                                                DATE: 05/10/2023 Therapeutic  Exercise: Supine deltoid isometrics 10 x 5 sec each Supine forward punch 10 x 5 sec Supine small shoulder circles x 10 cw/ccw x 3 Sidelying shoulder abduction to 90 deg 2 x 10 Seated table slide with FR 2 x 10 with 5 sec hold Seated shoulder blade squeezes with hands grasped in front to maintain elbow in front of midline 10 x 5 sec Manual Therapy: Left shoulder PROM shoulder elevation and ER  [facility] Adult PT Treatment:                                                DATE: 05/08/23 Therapeutic Exercise: Pendulums before and in between isometrics  Review of isometric HEP all planes 10 reps each 3-5 sec hold , cues for sub max pressure and  Supine elbow flexion x 10 Supine wrist AROM x 10  Supine Towel Grip 5 sec x 10  Manual Therapy: PROM flexion, abduction, ER  STW left deltoid Modalities: Ice pack at end of session 10 minutes left shoulder  PATIENT EDUCATION: Education details: HEP Person educated: Patient Education method: Programmer, multimedia, Demonstration, Actor cues, Verbal cues Education comprehension: verbalized understanding, returned demonstration, verbal cues required, tactile cues required, and needs further education  HOME EXERCISE PROGRAM: Access Code: [email or web address]   ASSESSMENT: CLINICAL IMPRESSION: Patient tolerated therapy well with no adverse effects. Therapy focused on progress left shoulder motion with good tolerance. She does demonstrate improvement in passive shoulder flexion this visit and denied any increase in pain but did report muscle fatigue with exercises. No changes to HEP this visit. Patient would benefit from continued skilled PT to progress her mobility and strength in order to maximize her functional ability.   EVAL: Patient is a 62 y.o. female who was seen today for physical therapy evaluation and treatment for 2 weeks s/p reverse total shoulder arthroplasty. L TSA on 04/16/23 by Dr Rise Paganini. Pt demonstrates decreased Left shoulder AROM, strength and also  some decreased AROM of R shoulder from previous surgery. She does not complain of pain on entering clinic but does have discomfort and  pain and difficulty with ADLs/IADLs, driving, sleep quality, and participation in normal daily activities especially yard work which she desires ot return.. She requires skilled PT services at this time to address all relevant deficits and return to PLOF   OBJECTIVE  IMPAIRMENTS: decreased activity tolerance, decreased endurance, decreased ROM, decreased strength, impaired UE functional use, postural dysfunction, and pain.   ACTIVITY LIMITATIONS: carrying, lifting, sleeping, bathing, dressing, reach over head, and hygiene/grooming  PARTICIPATION LIMITATIONS: meal prep, cleaning, laundry, driving, shopping, community activity, and yard work  PERSONAL FACTORS: allergies, asthma, anemia, fibromyalgia, GERD, HLD, HTN, hypothyroidism, R TSA, bil TKA, basal cell face CA, R RTC before R TSA, bunion on R foot, hernia repair are also affecting patient's functional outcome.    GOALS: Goals reviewed with patient? Yes  SHORT TERM GOALS: Target date: 05-31-23  Pt will be independent with initial HEP Baseline: Goal status: INITIAL  2.  Pt pain level will not be greater than 4/10 with active motion Baseline: Pt post surgery 2 weeks, only isometric ex Goal status: INITIAL  3.  Pt will achieve 90 degrees flexion  AROM Baseline: NT due to post surgery eval Goal status: INITIAL  LONG TERM GOALS: Target date: 06-28-23  Pt will be independent with advanced HEP Baseline: needs reinforcement has had R TSR reverse Goal status: INITIAL  2.  FOTO will improve from 32%  to   54% indicating improved functional mobility.  Baseline: eval 32% Goal status: INITIAL  3.  Left shoulder IR and ER will return to [facility] to return to 2/10 or less NPS for as dressing and grooming Baseline: Pt unable to dress without compensation and limited motion Goal status: INITIAL  4.  Pt will be  able to sleep on Left side without awaking due to pain Baseline: Pt post surgery 04-16-23 avoided lying on left side but now initial discomfort with lying on left side  Goal status: INITIAL  5.  Left  shoulder AROM scaption will improve to minimally 0-140 degrees for improved overhead reaching Baseline: SEE AROM chart Goal status: INITIAL  6.  Patient will demonstrate ability to push/pull at least 40# with <3/10 pain in order to return to heavy household and yardwork tasks.  Baseline: post op 04-16-23, only light weight such as coffee cup Goal status: INITIAL   PLAN: PT FREQUENCY: 2x/week  PT DURATION: 8 weeks  PLANNED INTERVENTIONS: 97164- PT Re-evaluation, 97110-Therapeutic exercises, 97530- Therapeutic activity, 97112- Neuromuscular re-education, 97535- Self Care, 16109- Manual therapy, 97014- Electrical stimulation (unattended), [id number]- Electrical stimulation (manual), Patient/Family education, Taping, Dry Needling, Joint mobilization, Cryotherapy, and Moist heat  PLAN FOR NEXT SESSION: Progression post op TSR reverse, isometrics and will get increase for AROM at MD in 2 week ( 4 week post op appt)  manual and pain modulation activities as needed   Rosana Hoes, PT, DPT, LAT, ATC 05/16/23  4:12 PM Phone: [phone] Fax: [phone]

## 2023-05-17 ENCOUNTER — Other Ambulatory Visit: Payer: Self-pay

## 2023-05-17 ENCOUNTER — Encounter: Payer: Self-pay | Admitting: Physical Therapy

## 2023-05-17 ENCOUNTER — Ambulatory Visit: Payer: Medicaid Other | Admitting: Physical Therapy

## 2023-05-17 DIAGNOSIS — M25512 Pain in left shoulder: Secondary | ICD-10-CM | POA: Diagnosis not present

## 2023-05-17 DIAGNOSIS — M6281 Muscle weakness (generalized): Secondary | ICD-10-CM

## 2023-05-17 NOTE — Therapy (Signed)
OUTPATIENT PHYSICAL THERAPY SHOULDER TREATMENT   Patient Name: Denise Morales MRN: [id number] DOB:1961/03/24, 62 y.o., female Today's Date: 05/17/2023   END OF SESSION:  PT End of Session - 05/17/23 1521     Visit Number 5    Number of Visits 17    Date for PT Re-Evaluation 06/28/23    Authorization Type [employer]    Authorization Time Period 05/07/23 - 08/05/23    Authorization - Visit Number 4    Authorization - Number of Visits 15    PT Start Time 1525    PT Stop Time 1605    PT Time Calculation (min) 40 min    Activity Tolerance Patient tolerated treatment well    Behavior During Therapy WFL for tasks assessed/performed                Past Medical History:  Diagnosis Date   Allergy    Anemia    Anxiety    Arthritis    Depression    Fibromyalgia    GERD (gastroesophageal reflux disease)    Heart murmur    History of hiatal hernia    Hyperlipidemia    Hypertension    Hypothyroidism    Melanoma (HCC)    face   Past Surgical History:  Procedure Laterality Date   ABDOMINAL HYSTERECTOMY  1985   AIKEN OSTEOTOMY Right 06/05/2022   Procedure: Quintella Reichert OSTEOTOMY;  Surgeon: Candelaria Stagers, DPM;  Location: [facility];  Service: Podiatry;  Laterality: Right;   BICEPT TENODESIS Right 12/05/2022   Procedure: RIGHT BICEPS TENODESIS;  Surgeon: Cammy Copa, MD;  Location: [facility] OR;  Service: Orthopedics;  Laterality: Right;   CAPSULOTOMY METATARSOPHALANGEAL Right 06/05/2022   Procedure: CAPSULOTOMY METATARSOPHALANGEAL;  Surgeon: Candelaria Stagers, DPM;  Location: [facility] [place];  Service: Podiatry;  Laterality: Right;   CHOLECYSTECTOMY     COLONOSCOPY     HALLUX VALGUS LAPIDUS Right 06/05/2022   Procedure: HALLUX VALGUS LAPIDUS;  Surgeon: Candelaria Stagers, DPM;  Location: [facility];  Service: Podiatry;  Laterality: Right;   HAMMER TOE SURGERY Right 06/05/2022   Procedure: HAMMER TOE CORRECTION;  Surgeon: Candelaria Stagers,  DPM;  Location: [facility] [place];  Service: Podiatry;  Laterality: Right;   REVERSE SHOULDER ARTHROPLASTY Right 12/05/2022   Procedure: RIGHT REVERSE SHOULDER ARTHROPLASTY;  Surgeon: Cammy Copa, MD;  Location: [facility] OR;  Service: Orthopedics;  Laterality: Right;   REVERSE SHOULDER ARTHROPLASTY Left 04/16/2023   Procedure: REVERSE SHOULDER ARTHROPLASTY;  Surgeon: Cammy Copa, MD;  Location: [facility] OR;  Service: Orthopedics;  Laterality: Left;   SHOULDER ARTHROSCOPY WITH ROTATOR CUFF REPAIR AND SUBACROMIAL DECOMPRESSION Right 08/12/2020   Procedure: RIGHT SHOULDER ARTHROSCOPY WITH ROTATOR CUFF REPAIR AND SUBACROMIAL DECOMPRESSION;  Surgeon: Kathryne Hitch, MD;  Location: [facility];  Service: Orthopedics;  Laterality: Right;   TOTAL KNEE ARTHROPLASTY Right 12/24/2020   Procedure: RIGHT TOTAL KNEE ARTHROPLASTY;  Surgeon: Kathryne Hitch, MD;  Location: [facility];  Service: Orthopedics;  Laterality: Right;   TOTAL KNEE ARTHROPLASTY Left 07/15/2021   Procedure: LEFT TOTAL KNEE ARTHROPLASTY;  Surgeon: Kathryne Hitch, MD;  Location: [facility];  Service: Orthopedics;  Laterality: Left;   XI ROBOTIC ASSISTED PARAESOPHAGEAL HERNIA REPAIR N/A 03/12/2023   Procedure: XI ROBOTIC ASSISTED PARAESOPHAGEAL HERNIA REPAIR WITH FUNDOPLICATION;  Surgeon: Berna Bue, MD;  Location: [facility];  Service: General;  Laterality: N/A;  180   Patient Active Problem List   Diagnosis  Date Noted   Arthritis of left shoulder region 04/24/2023   Biceps tendonitis, left 04/24/2023   S/P reverse total shoulder arthroplasty, left 04/16/2023   Depression, recurrent (HCC) 04/03/2023   S/P repair of paraesophageal hernia 03/12/2023   Arthritis of right shoulder region 12/24/2022   Biceps tendonitis on right 12/24/2022   S/P reverse total shoulder arthroplasty, right 12/05/2022   Vertigo 11/27/2022   Iron deficiency anemia 10/30/2022   Physical exam, annual 10/30/2022    Medication management 10/30/2022   Class 2 severe obesity due to excess calories with serious comorbidity in adult, unspecified BMI (HCC) 10/23/2022   Essential hypertension 10/23/2022   Prediabetes 10/23/2022   Hypothyroidism 10/23/2022   Insomnia 06/14/2022   Status post total left knee replacement 07/15/2021   Neoplasm of uncertain behavior of skin 05/25/2021   Generalized osteoarthritis of multiple sites 05/25/2021   Fibromyalgia syndrome 05/25/2021   Restless legs 03/16/2021   Hyperlipidemia 03/16/2021   Anxiety 03/16/2021   Status post right knee replacement 12/24/2020   Unilateral primary osteoarthritis, left knee 11/11/2020   Unilateral primary osteoarthritis, right knee 11/11/2020   Complete tear of right rotator cuff 08/12/2020   Hypertensive disorder 05/05/2019    PCP: Park Meo, FNP   REFERRING PROVIDER: Park Meo, FNP   REFERRING DIAG: [phone] (ICD-10-CM) - History of arthroplasty of left shoulder   THERAPY DIAG:  Acute pain of left shoulder  Muscle weakness (generalized)  Rationale for Evaluation and Treatment: Rehabilitation  ONSET DATE: L reverse TSA on 04/16/23   SUBJECTIVE:                                                                                                                                                                                     SUBJECTIVE STATEMENT:  Patient reports she is doing well, denies any achiness in the left shoulder today.  EVAL: I have been using CPM machine and doing light activities in the home like washing cups and not trying to carry anything heavy, L TSA on 10/21/ 24 by Dr August Saucer.  MD as DC sling. I feel like it is a toothache pain last night. I would like to be able to return to yard work and my mud room. I am having to maneuver clothes so I can try to dress myself.  I have been here before with Lelon Mast last time  PERTINENT HISTORY: allergies, asthma, anemia, fibromyalgia, GERD, HLD, HTN, hypothyroidism, R  TSA, bil TKA, basal cell face CA, R RTC before R TSA, bunion on R foot, hernia repair,    PAIN:  Are you having pain? NPRS scale: 0/10 Pain location: Left shoulder Pain description: Achy Aggravating factors: lying  on shoulder initially hurts but then adjusts.  Cannot drive,  I can shower but I assist my left arm with my right, I compensate and tie my bra in the front to dress.  Relieving factors: Resting Toothache pain  PRECAUTIONS: Plan is discontinue sling. Okay to very lightly lift with the operative extremity but no lifting anything heavier than a coffee cup or cell phone. Start physical therapy to focus on PROM and AROM with deltoid isometrics. Do not want to externally rotate past 30 degrees to protect subscapularis repair. Follow-up in 4 weeks for clinical recheck with Dr. August Saucer.   RED FLAGS: None   WEIGHT BEARING RESTRICTIONS: Yes No weight bearing until released for AROM next visit  FALLS:  Has patient fallen in last 6 months?   YES  slipped on deck steps but no issues other than that time.  LIVING ENVIRONMENT: Lives with: lives with their family and including dtr SIL and grandkids Lives in: House/apartment Stairs: Yes: External: 5 steps; can reach both Has following equipment at home: Single point cane, Walker - 2 wheeled, Wheelchair (manual), Shower bench, and bed side commode  OCCUPATION: Retired in Community education officer   PLOF: Independent  PATIENT GOALS:To use my arm with out limitation   OBJECTIVE:  Note: Objective measures were completed at Evaluation unless otherwise noted. PATIENT SURVEYS:  FOTO 32%  predicted 54%  POSTURE: Rounded shoulders  Right shoulder more forward than Left  UPPER EXTREMITY ROM:   Active ROM Right eval Left eval Left 05/08/23 Left 05/10/2023 Left 05/16/2023  Shoulder flexion 131/ PROM 145 NT to surgery 04-16-23 PROM 85 100 PROM 120 PROM  Shoulder extension 125/ PROM 142      Shoulder abduction   PROM 60    Shoulder adduction        Shoulder internal rotation       Shoulder external rotation   PROM 20 30 PROM   Elbow flexion       Elbow extension       Wrist flexion       Wrist extension       Wrist ulnar deviation       Wrist radial deviation       Wrist pronation       Wrist supination       (Blank rows = not tested)  UPPER EXTREMITY MMT:not formally assess at eval d/t ROM deficits and post-op status   MMT Right eval Left eval  Shoulder flexion  NT due to recent TSA surgery < 2 weeks  Shoulder extension    Shoulder abduction    Shoulder adduction    Shoulder internal rotation    Shoulder external rotation    Middle trapezius    Lower trapezius    Elbow flexion    Elbow extension    Wrist flexion    Wrist extension    Wrist ulnar deviation    Wrist radial deviation    Wrist pronation    Wrist supination    Grip strength (lbs)    (Blank rows = not tested)  SHOULDER SPECIAL TESTS: NT due to L TSA reverse  JOINT MOBILITY TESTING:  NT  PALPATION:  Pt with steri strips on surgical site well intact    TODAY'S TREATMENT [facility] Adult PT Treatment:  DATE: 05/17/2023 Therapeutic Exercise: Supine deltoid isometrics against manual resistance 2 x 10 with 5 sec Supine forward punch 10 x 5 sec Supine small shoulder circles at 90 deg flexion 3 x 10 cw/ccw Supine short arc flexion AROM 60-120 deg 3 x 10 Sidelying shoulder abduction to 90 deg 3 x 10 Seated shoulder abduction with elbow bent 3 x 10 Seated shoulder blade squeezes with hands grasped in front to maintain elbow in front of midline 2 x 10 with 5 sec hold Seated overhead pulley shoulder elevation x 4 min Prone shoulder extension to hip 3 x 10 Manual Therapy: Left shoulder PROM shoulder elevation   [facility] Adult PT Treatment:                                                DATE: 05/16/2023 Therapeutic Exercise: Supine deltoid isometrics 10 x 5 sec each Supine forward punch 10 x 5 sec Supine small  shoulder circles at 90 deg flexion 3 x 10 cw/ccw x 3 Supine short arc flexion AROM 70-120 deg 3 x 10 Sidelying shoulder abduction to 90 deg 3 x 10 Seated shoulder blade squeezes with hands grasped in front to maintain elbow in front of midline 2 x 10 with 5 sec hold Seated shoulder abduction with elbow bent 2 x 10 Standing shoulder isometrics at wall flexion, abduction, extension 10 x 5 sec each Prone shoulder extension to hip 3 x 10 Seated table slide shoulder elevation with FR 2 x 10 Manual Therapy: Left shoulder PROM shoulder elevation  [facility] Adult PT Treatment:                                                DATE: 05/10/2023 Therapeutic Exercise: Supine deltoid isometrics 10 x 5 sec each Supine forward punch 10 x 5 sec Supine small shoulder circles x 10 cw/ccw x 3 Sidelying shoulder abduction to 90 deg 2 x 10 Seated table slide with FR 2 x 10 with 5 sec hold Seated shoulder blade squeezes with hands grasped in front to maintain elbow in front of midline 10 x 5 sec Manual Therapy: Left shoulder PROM shoulder elevation and ER  [facility] Adult PT Treatment:                                                DATE: 05/08/23 Therapeutic Exercise: Pendulums before and in between isometrics  Review of isometric HEP all planes 10 reps each 3-5 sec hold , cues for sub max pressure and  Supine elbow flexion x 10 Supine wrist AROM x 10  Supine Towel Grip 5 sec x 10  Manual Therapy: PROM flexion, abduction, ER  STW left deltoid Modalities: Ice pack at end of session 10 minutes left shoulder  PATIENT EDUCATION: Education details: HEP update Person educated: Patient Education method: Explanation, Demonstration, Tactile cues, Verbal cues, Handout Education comprehension: verbalized understanding, returned demonstration, verbal cues required, tactile cues required, and needs further education  HOME EXERCISE PROGRAM: Access Code: [email or web address]   ASSESSMENT: CLINICAL IMPRESSION: Patient tolerated  therapy well with no adverse effects. Therapy continues to progress left shoulder  motion and light strengthening with good tolerance. She continues to demonstrate good shoulder motion and is progressing nicely with shoulder motion exercises. No pain reported with therapy and she does require occasional cueing to avoid shrug with exercises. Updated her HEP to progress supine shoulder elevation control. Patient would benefit from continued skilled PT to progress her mobility and strength in order to maximize her functional ability.   EVAL: Patient is a 62 y.o. female who was seen today for physical therapy evaluation and treatment for 2 weeks s/p reverse total shoulder arthroplasty. L TSA on 04/16/23 by Dr Rise Paganini. Pt demonstrates decreased Left shoulder AROM, strength and also some decreased AROM of R shoulder from previous surgery. She does not complain of pain on entering clinic but does have discomfort and  pain and difficulty with ADLs/IADLs, driving, sleep quality, and participation in normal daily activities especially yard work which she desires ot return.. She requires skilled PT services at this time to address all relevant deficits and return to PLOF   OBJECTIVE IMPAIRMENTS: decreased activity tolerance, decreased endurance, decreased ROM, decreased strength, impaired UE functional use, postural dysfunction, and pain.   ACTIVITY LIMITATIONS: carrying, lifting, sleeping, bathing, dressing, reach over head, and hygiene/grooming  PARTICIPATION LIMITATIONS: meal prep, cleaning, laundry, driving, shopping, community activity, and yard work  PERSONAL FACTORS: allergies, asthma, anemia, fibromyalgia, GERD, HLD, HTN, hypothyroidism, R TSA, bil TKA, basal cell face CA, R RTC before R TSA, bunion on R foot, hernia repair are also affecting patient's functional outcome.    GOALS: Goals reviewed with patient? Yes  SHORT TERM GOALS: Target date: 05-31-23  Pt will be independent with initial  HEP Baseline: Goal status: INITIAL  2.  Pt pain level will not be greater than 4/10 with active motion Baseline: Pt post surgery 2 weeks, only isometric ex Goal status: INITIAL  3.  Pt will achieve 90 degrees flexion  AROM Baseline: NT due to post surgery eval Goal status: INITIAL  LONG TERM GOALS: Target date: 06-28-23  Pt will be independent with advanced HEP Baseline: needs reinforcement has had R TSR reverse Goal status: INITIAL  2.  FOTO will improve from 32%  to   54% indicating improved functional mobility.  Baseline: eval 32% Goal status: INITIAL  3.  Left shoulder IR and ER will return to [facility] to return to 2/10 or less NPS for as dressing and grooming Baseline: Pt unable to dress without compensation and limited motion Goal status: INITIAL  4.  Pt will be able to sleep on Left side without awaking due to pain Baseline: Pt post surgery 04-16-23 avoided lying on left side but now initial discomfort with lying on left side  Goal status: INITIAL  5.  Left  shoulder AROM scaption will improve to minimally 0-140 degrees for improved overhead reaching Baseline: SEE AROM chart Goal status: INITIAL  6.  Patient will demonstrate ability to push/pull at least 40# with <3/10 pain in order to return to heavy household and yardwork tasks.  Baseline: post op 04-16-23, only light weight such as coffee cup Goal status: INITIAL   PLAN: PT FREQUENCY: 2x/week  PT DURATION: 8 weeks  PLANNED INTERVENTIONS: 97164- PT Re-evaluation, 97110-Therapeutic exercises, 97530- Therapeutic activity, 97112- Neuromuscular re-education, 97535- Self Care, 10960- Manual therapy, 97014- Electrical stimulation (unattended), [id number]- Electrical stimulation (manual), Patient/Family education, Taping, Dry Needling, Joint mobilization, Cryotherapy, and Moist heat  PLAN FOR NEXT SESSION: Progression post op TSR reverse, isometrics and will get increase for AROM at MD in 2 week (  4 week post op appt)  manual and  pain modulation activities as needed   Rosana Hoes, PT, DPT, LAT, ATC 05/17/23  4:08 PM Phone: [phone] Fax: [phone]

## 2023-05-18 NOTE — Therapy (Signed)
OUTPATIENT PHYSICAL THERAPY SHOULDER TREATMENT   Patient Name: Denise Morales MRN: [id number] DOB:1960-07-08, 62 y.o., female Today's Date: 05/21/2023   END OF SESSION:  PT End of Session - 05/21/23 1617     Visit Number 6    Number of Visits 17    Date for PT Re-Evaluation 06/28/23    Authorization Type [employer]    Authorization Time Period 05/07/23 - 08/05/23    Authorization - Visit Number 5    Authorization - Number of Visits 15    PT Start Time 1617    PT Stop Time 1658    PT Time Calculation (min) 41 min    Activity Tolerance Patient tolerated treatment well    Behavior During Therapy WFL for tasks assessed/performed                 Past Medical History:  Diagnosis Date   Allergy    Anemia    Anxiety    Arthritis    Depression    Fibromyalgia    GERD (gastroesophageal reflux disease)    Heart murmur    History of hiatal hernia    Hyperlipidemia    Hypertension    Hypothyroidism    Melanoma (HCC)    face   Past Surgical History:  Procedure Laterality Date   ABDOMINAL HYSTERECTOMY  1985   AIKEN OSTEOTOMY Right 06/05/2022   Procedure: Quintella Reichert OSTEOTOMY;  Surgeon: Candelaria Stagers, DPM;  Location: [facility];  Service: Podiatry;  Laterality: Right;   BICEPT TENODESIS Right 12/05/2022   Procedure: RIGHT BICEPS TENODESIS;  Surgeon: Cammy Copa, MD;  Location: [facility] OR;  Service: Orthopedics;  Laterality: Right;   CAPSULOTOMY METATARSOPHALANGEAL Right 06/05/2022   Procedure: CAPSULOTOMY METATARSOPHALANGEAL;  Surgeon: Candelaria Stagers, DPM;  Location: [facility];  Service: Podiatry;  Laterality: Right;   CHOLECYSTECTOMY     COLONOSCOPY     HALLUX VALGUS LAPIDUS Right 06/05/2022   Procedure: HALLUX VALGUS LAPIDUS;  Surgeon: Candelaria Stagers, DPM;  Location: [facility];  Service: Podiatry;  Laterality: Right;   HAMMER TOE SURGERY Right 06/05/2022   Procedure: HAMMER TOE CORRECTION;  Surgeon: Candelaria Stagers, DPM;  Location: [facility] [place];  Service: Podiatry;  Laterality: Right;   REVERSE SHOULDER ARTHROPLASTY Right 12/05/2022   Procedure: RIGHT REVERSE SHOULDER ARTHROPLASTY;  Surgeon: Cammy Copa, MD;  Location: [facility] OR;  Service: Orthopedics;  Laterality: Right;   REVERSE SHOULDER ARTHROPLASTY Left 04/16/2023   Procedure: REVERSE SHOULDER ARTHROPLASTY;  Surgeon: Cammy Copa, MD;  Location: [facility] OR;  Service: Orthopedics;  Laterality: Left;   SHOULDER ARTHROSCOPY WITH ROTATOR CUFF REPAIR AND SUBACROMIAL DECOMPRESSION Right 08/12/2020   Procedure: RIGHT SHOULDER ARTHROSCOPY WITH ROTATOR CUFF REPAIR AND SUBACROMIAL DECOMPRESSION;  Surgeon: Kathryne Hitch, MD;  Location: [facility];  Service: Orthopedics;  Laterality: Right;   TOTAL KNEE ARTHROPLASTY Right 12/24/2020   Procedure: RIGHT TOTAL KNEE ARTHROPLASTY;  Surgeon: Kathryne Hitch, MD;  Location: [facility];  Service: Orthopedics;  Laterality: Right;   TOTAL KNEE ARTHROPLASTY Left 07/15/2021   Procedure: LEFT TOTAL KNEE ARTHROPLASTY;  Surgeon: Kathryne Hitch, MD;  Location: [facility];  Service: Orthopedics;  Laterality: Left;   XI ROBOTIC ASSISTED PARAESOPHAGEAL HERNIA REPAIR N/A 03/12/2023   Procedure: XI ROBOTIC ASSISTED PARAESOPHAGEAL HERNIA REPAIR WITH FUNDOPLICATION;  Surgeon: Berna Bue, MD;  Location: [facility];  Service: General;  Laterality: N/A;  180   Patient Active Problem List  Diagnosis Date Noted   Arthritis of left shoulder region 04/24/2023   Biceps tendonitis, left 04/24/2023   S/P reverse total shoulder arthroplasty, left 04/16/2023   Depression, recurrent (HCC) 04/03/2023   S/P repair of paraesophageal hernia 03/12/2023   Arthritis of right shoulder region 12/24/2022   Biceps tendonitis on right 12/24/2022   S/P reverse total shoulder arthroplasty, right 12/05/2022   Vertigo 11/27/2022   Iron deficiency anemia 10/30/2022   Physical exam, annual 10/30/2022    Medication management 10/30/2022   Class 2 severe obesity due to excess calories with serious comorbidity in adult, unspecified BMI (HCC) 10/23/2022   Essential hypertension 10/23/2022   Prediabetes 10/23/2022   Hypothyroidism 10/23/2022   Insomnia 06/14/2022   Status post total left knee replacement 07/15/2021   Neoplasm of uncertain behavior of skin 05/25/2021   Generalized osteoarthritis of multiple sites 05/25/2021   Fibromyalgia syndrome 05/25/2021   Restless legs 03/16/2021   Hyperlipidemia 03/16/2021   Anxiety 03/16/2021   Status post right knee replacement 12/24/2020   Unilateral primary osteoarthritis, left knee 11/11/2020   Unilateral primary osteoarthritis, right knee 11/11/2020   Complete tear of right rotator cuff 08/12/2020   Hypertensive disorder 05/05/2019    PCP: Park Meo, FNP   REFERRING PROVIDER: Park Meo, FNP   REFERRING DIAG: [phone] (ICD-10-CM) - History of arthroplasty of left shoulder   THERAPY DIAG:  Acute pain of left shoulder  Muscle weakness (generalized)  Rationale for Evaluation and Treatment: Rehabilitation  ONSET DATE: L reverse TSA on 04/16/23   SUBJECTIVE:                                                                                                                                                                                     SUBJECTIVE STATEMENT:  05/21/2023 No pain at present but does report increased pain over the weekend. Felt good after last session.   EVAL: I have been using CPM machine and doing light activities in the home like washing cups and not trying to carry anything heavy, L TSA on 10/21/ 24 by Dr August Saucer.  MD as DC sling. I feel like it is a toothache pain last night. I would like to be able to return to yard work and my mud room. I am having to maneuver clothes so I can try to dress myself.  I have been here before with Lelon Mast last time  PERTINENT HISTORY: allergies, asthma, anemia, fibromyalgia, GERD,  HLD, HTN, hypothyroidism, R TSA, bil TKA, basal cell face CA, R RTC before R TSA, bunion on R foot, hernia repair,    PAIN:  Are you having pain? NPRS scale: 0/10 Pain location: Left shoulder  Pain description: Achy Aggravating factors: lying on shoulder initially hurts but then adjusts.  Cannot drive,  I can shower but I assist my left arm with my right, I compensate and tie my bra in the front to dress.  Relieving factors: Resting Toothache pain  PRECAUTIONS: Plan is discontinue sling. Okay to very lightly lift with the operative extremity but no lifting anything heavier than a coffee cup or cell phone. Start physical therapy to focus on PROM and AROM with deltoid isometrics. Do not want to externally rotate past 30 degrees to protect subscapularis repair. Follow-up in 4 weeks for clinical recheck with Dr. August Saucer.   RED FLAGS: None   WEIGHT BEARING RESTRICTIONS: Yes No weight bearing until released for AROM next visit  FALLS:  Has patient fallen in last 6 months?   YES  slipped on deck steps but no issues other than that time.  LIVING ENVIRONMENT: Lives with: lives with their family and including dtr SIL and grandkids Lives in: House/apartment Stairs: Yes: External: 5 steps; can reach both Has following equipment at home: Single point cane, Walker - 2 wheeled, Wheelchair (manual), Shower bench, and bed side commode  OCCUPATION: Retired in Community education officer   PLOF: Independent  PATIENT GOALS:To use my arm with out limitation   OBJECTIVE:  Note: Objective measures were completed at Evaluation unless otherwise noted. PATIENT SURVEYS:  FOTO 32%  predicted 54% FOTO 05/21/23 50%   POSTURE: Rounded shoulders  Right shoulder more forward than Left  UPPER EXTREMITY ROM:   Active ROM Right eval Left eval Left 05/08/23 Left 05/10/2023 Left 05/16/2023  Shoulder flexion 131/ PROM 145 NT to surgery 04-16-23 PROM 85 100 PROM 120 PROM  Shoulder extension 125/ PROM 142      Shoulder  abduction   PROM 60    Shoulder adduction       Shoulder internal rotation       Shoulder external rotation   PROM 20 30 PROM   Elbow flexion       Elbow extension       Wrist flexion       Wrist extension       Wrist ulnar deviation       Wrist radial deviation       Wrist pronation       Wrist supination       (Blank rows = not tested)  UPPER EXTREMITY MMT:not formally assess at eval d/t ROM deficits and post-op status   MMT Right eval Left eval  Shoulder flexion  NT due to recent TSA surgery < 2 weeks  Shoulder extension    Shoulder abduction    Shoulder adduction    Shoulder internal rotation    Shoulder external rotation    Middle trapezius    Lower trapezius    Elbow flexion    Elbow extension    Wrist flexion    Wrist extension    Wrist ulnar deviation    Wrist radial deviation    Wrist pronation    Wrist supination    Grip strength (lbs)    (Blank rows = not tested)  SHOULDER SPECIAL TESTS: NT due to L TSA reverse  JOINT MOBILITY TESTING:  NT  PALPATION:  Pt with steri strips on surgical site well intact    TODAY'S TREATMENT [facility] Adult PT Treatment:  DATE: 05/21/23 Therapeutic Exercise: Supine punch 2x10  Supine shoulder flexion AROM to ~120 deg 2x12 Sidelying shoulder abduction AROM to ~ 90 deg 2x12  Scapular retraction (hands clasped to avoid shoulder ext) 2x15  Shoulder flexion iso at wall 2x10 cues for alignment  Shoulder abduction iso at wall 2x8  Manual Therapy: L GH shoulder elevation PROM to tolerance    [facility] Adult PT Treatment:                                                DATE: 05/17/2023 Therapeutic Exercise: Supine deltoid isometrics against manual resistance 2 x 10 with 5 sec Supine forward punch 10 x 5 sec Supine small shoulder circles at 90 deg flexion 3 x 10 cw/ccw Supine short arc flexion AROM 60-120 deg 3 x 10 Sidelying shoulder abduction to 90 deg 3 x 10 Seated shoulder  abduction with elbow bent 3 x 10 Seated shoulder blade squeezes with hands grasped in front to maintain elbow in front of midline 2 x 10 with 5 sec hold Seated overhead pulley shoulder elevation x 4 min Prone shoulder extension to hip 3 x 10 Manual Therapy: Left shoulder PROM shoulder elevation   [facility] Adult PT Treatment:                                                DATE: 05/16/2023 Therapeutic Exercise: Supine deltoid isometrics 10 x 5 sec each Supine forward punch 10 x 5 sec Supine small shoulder circles at 90 deg flexion 3 x 10 cw/ccw x 3 Supine short arc flexion AROM 70-120 deg 3 x 10 Sidelying shoulder abduction to 90 deg 3 x 10 Seated shoulder blade squeezes with hands grasped in front to maintain elbow in front of midline 2 x 10 with 5 sec hold Seated shoulder abduction with elbow bent 2 x 10 Standing shoulder isometrics at wall flexion, abduction, extension 10 x 5 sec each Prone shoulder extension to hip 3 x 10 Seated table slide shoulder elevation with FR 2 x 10 Manual Therapy: Left shoulder PROM shoulder elevation  [facility] Adult PT Treatment:                                                DATE: 05/10/2023 Therapeutic Exercise: Supine deltoid isometrics 10 x 5 sec each Supine forward punch 10 x 5 sec Supine small shoulder circles x 10 cw/ccw x 3 Sidelying shoulder abduction to 90 deg 2 x 10 Seated table slide with FR 2 x 10 with 5 sec hold Seated shoulder blade squeezes with hands grasped in front to maintain elbow in front of midline 10 x 5 sec Manual Therapy: Left shoulder PROM shoulder elevation and ER   PATIENT EDUCATION: Education details: HEP update Person educated: Patient Education method: Explanation, Demonstration, Tactile cues, Verbal cues, Handout Education comprehension: verbalized understanding, returned demonstration, verbal cues required, tactile cues required, and needs further education  HOME EXERCISE PROGRAM: Access Code:  [email or web address]   ASSESSMENT: CLINICAL IMPRESSION: 05/21/2023 Pt arrives w/o resting pain, continues to progress well overall. Gentle progressions for volume today with pt reporting mild  transient discomfort at end ranges of flexion and abduction but overall tolerating well. No adverse events, departs with report of muscular fatigue but no pain. Recommend continuing along current POC in order to address relevant deficits and improve functional tolerance. Pt departs today's session in no acute distress, all voiced questions/concerns addressed appropriately from PT perspective.    EVAL: Patient is a 62 y.o. female who was seen today for physical therapy evaluation and treatment for 2 weeks s/p reverse total shoulder arthroplasty. L TSA on 04/16/23 by Dr Rise Paganini. Pt demonstrates decreased Left shoulder AROM, strength and also some decreased AROM of R shoulder from previous surgery. She does not complain of pain on entering clinic but does have discomfort and  pain and difficulty with ADLs/IADLs, driving, sleep quality, and participation in normal daily activities especially yard work which she desires ot return.. She requires skilled PT services at this time to address all relevant deficits and return to PLOF   OBJECTIVE IMPAIRMENTS: decreased activity tolerance, decreased endurance, decreased ROM, decreased strength, impaired UE functional use, postural dysfunction, and pain.   ACTIVITY LIMITATIONS: carrying, lifting, sleeping, bathing, dressing, reach over head, and hygiene/grooming  PARTICIPATION LIMITATIONS: meal prep, cleaning, laundry, driving, shopping, community activity, and yard work  PERSONAL FACTORS: allergies, asthma, anemia, fibromyalgia, GERD, HLD, HTN, hypothyroidism, R TSA, bil TKA, basal cell face CA, R RTC before R TSA, bunion on R foot, hernia repair are also affecting patient's functional outcome.    GOALS: Goals reviewed with patient? Yes  SHORT TERM GOALS: Target date:  05-31-23  Pt will be independent with initial HEP Baseline: Goal status: INITIAL  2.  Pt pain level will not be greater than 4/10 with active motion Baseline: Pt post surgery 2 weeks, only isometric ex Goal status: INITIAL  3.  Pt will achieve 90 degrees flexion  AROM Baseline: NT due to post surgery eval Goal status: INITIAL  LONG TERM GOALS: Target date: 06-28-23  Pt will be independent with advanced HEP Baseline: needs reinforcement has had R TSR reverse Goal status: INITIAL  2.  FOTO will improve from 32%  to   54% indicating improved functional mobility.  Baseline: eval 32% Goal status: INITIAL  3.  Left shoulder IR and ER will return to [facility] to return to 2/10 or less NPS for as dressing and grooming Baseline: Pt unable to dress without compensation and limited motion Goal status: INITIAL  4.  Pt will be able to sleep on Left side without awaking due to pain Baseline: Pt post surgery 04-16-23 avoided lying on left side but now initial discomfort with lying on left side  Goal status: INITIAL  5.  Left  shoulder AROM scaption will improve to minimally 0-140 degrees for improved overhead reaching Baseline: SEE AROM chart Goal status: INITIAL  6.  Patient will demonstrate ability to push/pull at least 40# with <3/10 pain in order to return to heavy household and yardwork tasks.  Baseline: post op 04-16-23, only light weight such as coffee cup Goal status: INITIAL   PLAN: PT FREQUENCY: 2x/week  PT DURATION: 8 weeks  PLANNED INTERVENTIONS: 97164- PT Re-evaluation, 97110-Therapeutic exercises, 97530- Therapeutic activity, 97112- Neuromuscular re-education, 97535- Self Care, 16109- Manual therapy, 97014- Electrical stimulation (unattended), [id number]- Electrical stimulation (manual), Patient/Family education, Taping, Dry Needling, Joint mobilization, Cryotherapy, and Moist heat  PLAN FOR NEXT SESSION: continue with isometrics, AAROM/AROM in gravity reduced positions. Avoiding  IR/ext, no ER past 30 deg   Ashley Murrain PT, DPT 05/21/2023 5:02 PM

## 2023-05-21 ENCOUNTER — Encounter: Payer: Self-pay | Admitting: Physical Therapy

## 2023-05-21 ENCOUNTER — Ambulatory Visit: Payer: Medicaid Other | Admitting: Physical Therapy

## 2023-05-21 DIAGNOSIS — M25512 Pain in left shoulder: Secondary | ICD-10-CM | POA: Diagnosis not present

## 2023-05-21 DIAGNOSIS — M6281 Muscle weakness (generalized): Secondary | ICD-10-CM

## 2023-05-22 NOTE — Therapy (Signed)
OUTPATIENT PHYSICAL THERAPY SHOULDER TREATMENT   Patient Name: Denise Morales MRN: [id number] DOB:Oct 16, 1960, 62 y.o., female Today's Date: 05/23/2023   END OF SESSION:  PT End of Session - 05/23/23 1612     Visit Number 7    Number of Visits 17    Date for PT Re-Evaluation 06/28/23    Authorization Type [employer]    Authorization Time Period 05/07/23 - 08/05/23    Authorization - Visit Number 6    Authorization - Number of Visits 15    PT Start Time 1615    PT Stop Time 1700    PT Time Calculation (min) 45 min    Activity Tolerance Patient tolerated treatment well    Behavior During Therapy WFL for tasks assessed/performed                  Past Medical History:  Diagnosis Date   Allergy    Anemia    Anxiety    Arthritis    Depression    Fibromyalgia    GERD (gastroesophageal reflux disease)    Heart murmur    History of hiatal hernia    Hyperlipidemia    Hypertension    Hypothyroidism    Melanoma (HCC)    face   Past Surgical History:  Procedure Laterality Date   ABDOMINAL HYSTERECTOMY  1985   AIKEN OSTEOTOMY Right 06/05/2022   Procedure: Quintella Reichert OSTEOTOMY;  Surgeon: Candelaria Stagers, DPM;  Location: [facility];  Service: Podiatry;  Laterality: Right;   BICEPT TENODESIS Right 12/05/2022   Procedure: RIGHT BICEPS TENODESIS;  Surgeon: Cammy Copa, MD;  Location: [facility] OR;  Service: Orthopedics;  Laterality: Right;   CAPSULOTOMY METATARSOPHALANGEAL Right 06/05/2022   Procedure: CAPSULOTOMY METATARSOPHALANGEAL;  Surgeon: Candelaria Stagers, DPM;  Location: [facility];  Service: Podiatry;  Laterality: Right;   CHOLECYSTECTOMY     COLONOSCOPY     HALLUX VALGUS LAPIDUS Right 06/05/2022   Procedure: HALLUX VALGUS LAPIDUS;  Surgeon: Candelaria Stagers, DPM;  Location: [facility];  Service: Podiatry;  Laterality: Right;   HAMMER TOE SURGERY Right 06/05/2022   Procedure: HAMMER TOE CORRECTION;  Surgeon: Candelaria Stagers, DPM;  Location: [facility];  Service: Podiatry;  Laterality: Right;   REVERSE SHOULDER ARTHROPLASTY Right 12/05/2022   Procedure: RIGHT REVERSE SHOULDER ARTHROPLASTY;  Surgeon: Cammy Copa, MD;  Location: [facility] OR;  Service: Orthopedics;  Laterality: Right;   REVERSE SHOULDER ARTHROPLASTY Left 04/16/2023   Procedure: REVERSE SHOULDER ARTHROPLASTY;  Surgeon: Cammy Copa, MD;  Location: [facility] OR;  Service: Orthopedics;  Laterality: Left;   SHOULDER ARTHROSCOPY WITH ROTATOR CUFF REPAIR AND SUBACROMIAL DECOMPRESSION Right 08/12/2020   Procedure: RIGHT SHOULDER ARTHROSCOPY WITH ROTATOR CUFF REPAIR AND SUBACROMIAL DECOMPRESSION;  Surgeon: Kathryne Hitch, MD;  Location: [facility];  Service: Orthopedics;  Laterality: Right;   TOTAL KNEE ARTHROPLASTY Right 12/24/2020   Procedure: RIGHT TOTAL KNEE ARTHROPLASTY;  Surgeon: Kathryne Hitch, MD;  Location: [facility];  Service: Orthopedics;  Laterality: Right;   TOTAL KNEE ARTHROPLASTY Left 07/15/2021   Procedure: LEFT TOTAL KNEE ARTHROPLASTY;  Surgeon: Kathryne Hitch, MD;  Location: [facility];  Service: Orthopedics;  Laterality: Left;   XI ROBOTIC ASSISTED PARAESOPHAGEAL HERNIA REPAIR N/A 03/12/2023   Procedure: XI ROBOTIC ASSISTED PARAESOPHAGEAL HERNIA REPAIR WITH FUNDOPLICATION;  Surgeon: Berna Bue, MD;  Location: [facility];  Service: General;  Laterality: N/A;  180   Patient Active Problem List  Diagnosis Date Noted   Arthritis of left shoulder region 04/24/2023   Biceps tendonitis, left 04/24/2023   S/P reverse total shoulder arthroplasty, left 04/16/2023   Depression, recurrent (HCC) 04/03/2023   S/P repair of paraesophageal hernia 03/12/2023   Arthritis of right shoulder region 12/24/2022   Biceps tendonitis on right 12/24/2022   S/P reverse total shoulder arthroplasty, right 12/05/2022   Vertigo 11/27/2022   Iron deficiency anemia 10/30/2022   Physical exam, annual  10/30/2022   Medication management 10/30/2022   Class 2 severe obesity due to excess calories with serious comorbidity in adult, unspecified BMI (HCC) 10/23/2022   Essential hypertension 10/23/2022   Prediabetes 10/23/2022   Hypothyroidism 10/23/2022   Insomnia 06/14/2022   Status post total left knee replacement 07/15/2021   Neoplasm of uncertain behavior of skin 05/25/2021   Generalized osteoarthritis of multiple sites 05/25/2021   Fibromyalgia syndrome 05/25/2021   Restless legs 03/16/2021   Hyperlipidemia 03/16/2021   Anxiety 03/16/2021   Status post right knee replacement 12/24/2020   Unilateral primary osteoarthritis, left knee 11/11/2020   Unilateral primary osteoarthritis, right knee 11/11/2020   Complete tear of right rotator cuff 08/12/2020   Hypertensive disorder 05/05/2019    PCP: Park Meo, FNP   REFERRING PROVIDER: Park Meo, FNP   REFERRING DIAG: [phone] (ICD-10-CM) - History of arthroplasty of left shoulder   THERAPY DIAG:  Acute pain of left shoulder  Muscle weakness (generalized)  Rationale for Evaluation and Treatment: Rehabilitation  ONSET DATE: L reverse TSA on 04/16/23   SUBJECTIVE:                                                                                                                                                                                     SUBJECTIVE STATEMENT:  05/23/2023 2/10 pain at present, a little bit aching today, does endorse some increased activity over past couple days. Felt good after last session. Exercises going well at home. Accompanied by daughter per her request   EVAL: I have been using CPM machine and doing light activities in the home like washing cups and not trying to carry anything heavy, L TSA on 10/21/ 24 by Dr August Saucer.  MD as DC sling. I feel like it is a toothache pain last night. I would like to be able to return to yard work and my mud room. I am having to maneuver clothes so I can try to dress  myself.  I have been here before with Lelon Mast last time  PERTINENT HISTORY: allergies, asthma, anemia, fibromyalgia, GERD, HLD, HTN, hypothyroidism, R TSA, bil TKA, basal cell face CA, R RTC before R TSA, bunion on R foot, hernia  repair,    PAIN:  Are you having pain? NPRS scale: 0/10 Pain location: Left shoulder Pain description: Achy Aggravating factors: lying on shoulder initially hurts but then adjusts.  Cannot drive,  I can shower but I assist my left arm with my right, I compensate and tie my bra in the front to dress.  Relieving factors: Resting Toothache pain  PRECAUTIONS: Plan is discontinue sling. Okay to very lightly lift with the operative extremity but no lifting anything heavier than a coffee cup or cell phone. Start physical therapy to focus on PROM and AROM with deltoid isometrics. Do not want to externally rotate past 30 degrees to protect subscapularis repair. Follow-up in 4 weeks for clinical recheck with Dr. August Saucer.   RED FLAGS: None   WEIGHT BEARING RESTRICTIONS: Yes No weight bearing until released for AROM next visit  FALLS:  Has patient fallen in last 6 months?   YES  slipped on deck steps but no issues other than that time.  LIVING ENVIRONMENT: Lives with: lives with their family and including dtr SIL and grandkids Lives in: House/apartment Stairs: Yes: External: 5 steps; can reach both Has following equipment at home: Single point cane, Walker - 2 wheeled, Wheelchair (manual), Shower bench, and bed side commode  OCCUPATION: Retired in Community education officer   PLOF: Independent  PATIENT GOALS:To use my arm with out limitation   OBJECTIVE:  Note: Objective measures were completed at Evaluation unless otherwise noted. PATIENT SURVEYS:  FOTO 32%  predicted 54% FOTO 05/21/23 50%   POSTURE: Rounded shoulders  Right shoulder more forward than Left  UPPER EXTREMITY ROM:   Active ROM Right eval Left eval Left 05/08/23 Left 05/10/2023 Left 05/16/2023  Shoulder  flexion 131/ PROM 145 NT to surgery 04-16-23 PROM 85 100 PROM 120 PROM  Shoulder extension 125/ PROM 142      Shoulder abduction   PROM 60    Shoulder adduction       Shoulder internal rotation       Shoulder external rotation   PROM 20 30 PROM   Elbow flexion       Elbow extension       Wrist flexion       Wrist extension       Wrist ulnar deviation       Wrist radial deviation       Wrist pronation       Wrist supination       (Blank rows = not tested)  UPPER EXTREMITY MMT:not formally assess at eval d/t ROM deficits and post-op status   MMT Right eval Left eval  Shoulder flexion  NT due to recent TSA surgery < 2 weeks  Shoulder extension    Shoulder abduction    Shoulder adduction    Shoulder internal rotation    Shoulder external rotation    Middle trapezius    Lower trapezius    Elbow flexion    Elbow extension    Wrist flexion    Wrist extension    Wrist ulnar deviation    Wrist radial deviation    Wrist pronation    Wrist supination    Grip strength (lbs)    (Blank rows = not tested)  SHOULDER SPECIAL TESTS: NT due to L TSA reverse  JOINT MOBILITY TESTING:  NT  PALPATION:  Pt with steri strips on surgical site well intact    TODAY'S TREATMENT [facility] Adult PT Treatment:  DATE: 05/23/23 Therapeutic Exercise: Supine dowel flexion AAROM x12 Supine flexion AROM 2x8 Sidelying shoulder ER AROM to neutral only 2x8, cues for shoulder positioning Scapular retraction in cradle position to avoid extension x20 Shoulder flexion isometric 3x8  Shoulder abduction isometric 3x8 HEP update + education  Manual Therapy: L GH shoulder elevation PROM to tolerance   [facility] Adult PT Treatment:                                                DATE: 05/21/23 Therapeutic Exercise: Supine punch 2x10  Supine shoulder flexion AROM to ~120 deg 2x12 Sidelying shoulder abduction AROM to ~ 90 deg 2x12  Scapular retraction (hands  clasped to avoid shoulder ext) 2x15  Shoulder flexion iso at wall 2x10 cues for alignment  Shoulder abduction iso at wall 2x8  Manual Therapy: L GH shoulder elevation PROM to tolerance    [facility] Adult PT Treatment:                                                DATE: 05/17/2023 Therapeutic Exercise: Supine deltoid isometrics against manual resistance 2 x 10 with 5 sec Supine forward punch 10 x 5 sec Supine small shoulder circles at 90 deg flexion 3 x 10 cw/ccw Supine short arc flexion AROM 60-120 deg 3 x 10 Sidelying shoulder abduction to 90 deg 3 x 10 Seated shoulder abduction with elbow bent 3 x 10 Seated shoulder blade squeezes with hands grasped in front to maintain elbow in front of midline 2 x 10 with 5 sec hold Seated overhead pulley shoulder elevation x 4 min Prone shoulder extension to hip 3 x 10 Manual Therapy: Left shoulder PROM shoulder elevation   PATIENT EDUCATION: Education details: rationale for interventions, HEP  Person educated: Patient Education method: Explanation, Demonstration, Tactile cues, Verbal cues Education comprehension: verbalized understanding, returned demonstration, verbal cues required, tactile cues required, and needs further education     HOME EXERCISE PROGRAM: Access Code: [email or web address]   ASSESSMENT: CLINICAL IMPRESSION: 05/23/2023 Pt arrives w/ mild pain, no issues after last session. Today continues to tolerate activity well, primary report of muscular fatigue which she attributes to increased activity over last couple days. No overt increase in pain, no adverse events. Recommend continuing along current POC in order to address relevant deficits and improve functional tolerance. Pt departs today's session in no acute distress, all voiced questions/concerns addressed appropriately from PT perspective.    EVAL: Patient is a 62 y.o. female who was seen today for physical therapy evaluation and treatment for 2 weeks s/p reverse total shoulder  arthroplasty. L TSA on 04/16/23 by Dr Rise Paganini. Pt demonstrates decreased Left shoulder AROM, strength and also some decreased AROM of R shoulder from previous surgery. She does not complain of pain on entering clinic but does have discomfort and  pain and difficulty with ADLs/IADLs, driving, sleep quality, and participation in normal daily activities especially yard work which she desires ot return.. She requires skilled PT services at this time to address all relevant deficits and return to PLOF   OBJECTIVE IMPAIRMENTS: decreased activity tolerance, decreased endurance, decreased ROM, decreased strength, impaired UE functional use, postural dysfunction, and pain.   ACTIVITY LIMITATIONS: carrying, lifting, sleeping, bathing, dressing, reach  over head, and hygiene/grooming  PARTICIPATION LIMITATIONS: meal prep, cleaning, laundry, driving, shopping, community activity, and yard work  PERSONAL FACTORS: allergies, asthma, anemia, fibromyalgia, GERD, HLD, HTN, hypothyroidism, R TSA, bil TKA, basal cell face CA, R RTC before R TSA, bunion on R foot, hernia repair are also affecting patient's functional outcome.    GOALS: Goals reviewed with patient? Yes  SHORT TERM GOALS: Target date: 05-31-23  Pt will be independent with initial HEP Baseline: Goal status: INITIAL  2.  Pt pain level will not be greater than 4/10 with active motion Baseline: Pt post surgery 2 weeks, only isometric ex Goal status: INITIAL  3.  Pt will achieve 90 degrees flexion  AROM Baseline: NT due to post surgery eval Goal status: INITIAL  LONG TERM GOALS: Target date: 06-28-23  Pt will be independent with advanced HEP Baseline: needs reinforcement has had R TSR reverse Goal status: INITIAL  2.  FOTO will improve from 32%  to   54% indicating improved functional mobility.  Baseline: eval 32% Goal status: INITIAL  3.  Left shoulder IR and ER will return to [facility] to return to 2/10 or less NPS for as dressing and  grooming Baseline: Pt unable to dress without compensation and limited motion Goal status: INITIAL  4.  Pt will be able to sleep on Left side without awaking due to pain Baseline: Pt post surgery 04-16-23 avoided lying on left side but now initial discomfort with lying on left side  Goal status: INITIAL  5.  Left  shoulder AROM scaption will improve to minimally 0-140 degrees for improved overhead reaching Baseline: SEE AROM chart Goal status: INITIAL  6.  Patient will demonstrate ability to push/pull at least 40# with <3/10 pain in order to return to heavy household and yardwork tasks.  Baseline: post op 04-16-23, only light weight such as coffee cup Goal status: INITIAL   PLAN: PT FREQUENCY: 2x/week  PT DURATION: 8 weeks  PLANNED INTERVENTIONS: 97164- PT Re-evaluation, 97110-Therapeutic exercises, 97530- Therapeutic activity, 97112- Neuromuscular re-education, 97535- Self Care, 74259- Manual therapy, 97014- Electrical stimulation (unattended), [id number]- Electrical stimulation (manual), Patient/Family education, Taping, Dry Needling, Joint mobilization, Cryotherapy, and Moist heat  PLAN FOR NEXT SESSION: continue with isometrics, AAROM/AROM in gravity reduced positions. Avoiding IR/ext, no ER past 30 deg   Ashley Murrain PT, DPT 05/23/2023 5:09 PM

## 2023-05-23 ENCOUNTER — Ambulatory Visit: Payer: Medicaid Other | Admitting: Physical Therapy

## 2023-05-23 ENCOUNTER — Encounter: Payer: Self-pay | Admitting: Physical Therapy

## 2023-05-23 DIAGNOSIS — M25512 Pain in left shoulder: Secondary | ICD-10-CM | POA: Diagnosis not present

## 2023-05-23 DIAGNOSIS — M6281 Muscle weakness (generalized): Secondary | ICD-10-CM

## 2023-05-28 ENCOUNTER — Ambulatory Visit (INDEPENDENT_AMBULATORY_CARE_PROVIDER_SITE_OTHER): Payer: Medicaid Other | Admitting: Orthopedic Surgery

## 2023-05-28 ENCOUNTER — Encounter: Payer: Self-pay | Admitting: Orthopedic Surgery

## 2023-05-28 DIAGNOSIS — Z96612 Presence of left artificial shoulder joint: Secondary | ICD-10-CM

## 2023-05-28 NOTE — Therapy (Signed)
OUTPATIENT PHYSICAL THERAPY SHOULDER TREATMENT   Patient Name: Denise Morales MRN: [id number] DOB:[birth year], 62 y.o., female Today's Date: 05/29/2023   END OF SESSION:  PT End of Session - 05/29/23 1616     Visit Number 8    Number of Visits 17    Date for PT Re-Evaluation 06/28/23    Authorization Type [employer]    Authorization Time Period 05/07/23 - 08/05/23    Authorization - Visit Number 7    Authorization - Number of Visits 15    PT Start Time 1616    PT Stop Time 1657    PT Time Calculation (min) 41 min    Activity Tolerance Patient tolerated treatment well    Behavior During Therapy WFL for tasks assessed/performed                   Past Medical History:  Diagnosis Date   Allergy    Anemia    Anxiety    Arthritis    Depression    Fibromyalgia    GERD (gastroesophageal reflux disease)    Heart murmur    History of hiatal hernia    Hyperlipidemia    Hypertension    Hypothyroidism    Melanoma (HCC)    face   Past Surgical History:  Procedure Laterality Date   ABDOMINAL HYSTERECTOMY  1985   AIKEN OSTEOTOMY Right 06/05/2022   Procedure: Quintella Reichert OSTEOTOMY;  Surgeon: Candelaria Stagers, DPM;  Location: [facility];  Service: Podiatry;  Laterality: Right;   BICEPT TENODESIS Right 12/05/2022   Procedure: RIGHT BICEPS TENODESIS;  Surgeon: Cammy Copa, MD;  Location: [facility] OR;  Service: Orthopedics;  Laterality: Right;   CAPSULOTOMY METATARSOPHALANGEAL Right 06/05/2022   Procedure: CAPSULOTOMY METATARSOPHALANGEAL;  Surgeon: Candelaria Stagers, DPM;  Location: [facility];  Service: Podiatry;  Laterality: Right;   CHOLECYSTECTOMY     COLONOSCOPY     HALLUX VALGUS LAPIDUS Right 06/05/2022   Procedure: HALLUX VALGUS LAPIDUS;  Surgeon: Candelaria Stagers, DPM;  Location: [facility];  Service: Podiatry;  Laterality: Right;   HAMMER TOE SURGERY Right 06/05/2022   Procedure: HAMMER TOE CORRECTION;  Surgeon: Candelaria Stagers, DPM;  Location: [facility];  Service: Podiatry;  Laterality: Right;   REVERSE SHOULDER ARTHROPLASTY Right 12/05/2022   Procedure: RIGHT REVERSE SHOULDER ARTHROPLASTY;  Surgeon: Cammy Copa, MD;  Location: [facility] OR;  Service: Orthopedics;  Laterality: Right;   REVERSE SHOULDER ARTHROPLASTY Left 04/16/2023   Procedure: REVERSE SHOULDER ARTHROPLASTY;  Surgeon: Cammy Copa, MD;  Location: [facility] OR;  Service: Orthopedics;  Laterality: Left;   SHOULDER ARTHROSCOPY WITH ROTATOR CUFF REPAIR AND SUBACROMIAL DECOMPRESSION Right 08/12/2020   Procedure: RIGHT SHOULDER ARTHROSCOPY WITH ROTATOR CUFF REPAIR AND SUBACROMIAL DECOMPRESSION;  Surgeon: Kathryne Hitch, MD;  Location: [facility];  Service: Orthopedics;  Laterality: Right;   TOTAL KNEE ARTHROPLASTY Right 12/24/2020   Procedure: RIGHT TOTAL KNEE ARTHROPLASTY;  Surgeon: Kathryne Hitch, MD;  Location: [facility];  Service: Orthopedics;  Laterality: Right;   TOTAL KNEE ARTHROPLASTY Left 07/15/2021   Procedure: LEFT TOTAL KNEE ARTHROPLASTY;  Surgeon: Kathryne Hitch, MD;  Location: [facility];  Service: Orthopedics;  Laterality: Left;   XI ROBOTIC ASSISTED PARAESOPHAGEAL HERNIA REPAIR N/A 03/12/2023   Procedure: XI ROBOTIC ASSISTED PARAESOPHAGEAL HERNIA REPAIR WITH FUNDOPLICATION;  Surgeon: Berna Bue, MD;  Location: [facility];  Service: General;  Laterality: N/A;  180   Patient Active Problem List  Diagnosis Date Noted   Arthritis of left shoulder region 04/24/2023   Biceps tendonitis, left 04/24/2023   S/P reverse total shoulder arthroplasty, left 04/16/2023   Depression, recurrent (HCC) 04/03/2023   S/P repair of paraesophageal hernia 03/12/2023   Arthritis of right shoulder region 12/24/2022   Biceps tendonitis on right 12/24/2022   S/P reverse total shoulder arthroplasty, right 12/05/2022   Vertigo 11/27/2022   Iron deficiency anemia 10/30/2022   Physical exam, annual  10/30/2022   Medication management 10/30/2022   Class 2 severe obesity due to excess calories with serious comorbidity in adult, unspecified BMI (HCC) 10/23/2022   Essential hypertension 10/23/2022   Prediabetes 10/23/2022   Hypothyroidism 10/23/2022   Insomnia 06/14/2022   Status post total left knee replacement 07/15/2021   Neoplasm of uncertain behavior of skin 05/25/2021   Generalized osteoarthritis of multiple sites 05/25/2021   Fibromyalgia syndrome 05/25/2021   Restless legs 03/16/2021   Hyperlipidemia 03/16/2021   Anxiety 03/16/2021   Status post right knee replacement 12/24/2020   Unilateral primary osteoarthritis, left knee 11/11/2020   Unilateral primary osteoarthritis, right knee 11/11/2020   Complete tear of right rotator cuff 08/12/2020   Hypertensive disorder 05/05/2019    PCP: Park Meo, FNP   REFERRING PROVIDER: Park Meo, FNP   REFERRING DIAG: [phone] (ICD-10-CM) - History of arthroplasty of left shoulder   THERAPY DIAG:  Acute pain of left shoulder  Muscle weakness (generalized)  Rationale for Evaluation and Treatment: Rehabilitation  ONSET DATE: L reverse TSA on 04/16/23   SUBJECTIVE:                                                                                                                                                                                     SUBJECTIVE STATEMENT:  05/29/2023 Pt states surgeon visit went well, no concerns. States she was told she could start banded exercises and can lift up to 20#. No issues after last session, no other new updates. Had some pain this morning but improved with activity.    EVAL: I have been using CPM machine and doing light activities in the home like washing cups and not trying to carry anything heavy, L TSA on 10/21/ 24 by Dr August Saucer.  MD as DC sling. I feel like it is a toothache pain last night. I would like to be able to return to yard work and my mud room. I am having to maneuver clothes  so I can try to dress myself.  I have been here before with Lelon Mast last time  PERTINENT HISTORY: allergies, asthma, anemia, fibromyalgia, GERD, HLD, HTN, hypothyroidism, R TSA, bil TKA, basal cell face CA, R RTC  before R TSA, bunion on R foot, hernia repair,    PAIN:  Are you having pain? NPRS scale: 0/10 Pain location: Left shoulder Pain description: Achy Aggravating factors: lying on shoulder initially hurts but then adjusts.  Cannot drive,  I can shower but I assist my left arm with my right, I compensate and tie my bra in the front to dress.  Relieving factors: Resting Toothache pain  PRECAUTIONS: Plan is discontinue sling. Okay to very lightly lift with the operative extremity but no lifting anything heavier than a coffee cup or cell phone. Start physical therapy to focus on PROM and AROM with deltoid isometrics. Do not want to externally rotate past 30 degrees to protect subscapularis repair. Follow-up in 4 weeks for clinical recheck with Dr. August Saucer.   RED FLAGS: None   WEIGHT BEARING RESTRICTIONS: Yes No weight bearing until released for AROM next visit  FALLS:  Has patient fallen in last 6 months?   YES  slipped on deck steps but no issues other than that time.  LIVING ENVIRONMENT: Lives with: lives with their family and including dtr SIL and grandkids Lives in: House/apartment Stairs: Yes: External: 5 steps; can reach both Has following equipment at home: Single point cane, Walker - 2 wheeled, Wheelchair (manual), Shower bench, and bed side commode  OCCUPATION: Retired in Community education officer   PLOF: Independent  PATIENT GOALS:To use my arm with out limitation   OBJECTIVE:  Note: Objective measures were completed at Evaluation unless otherwise noted. PATIENT SURVEYS:  FOTO 32%  predicted 54% FOTO 05/21/23 50%   POSTURE: Rounded shoulders  Right shoulder more forward than Left  UPPER EXTREMITY ROM:   Active ROM Right eval Left eval Left 05/08/23 Left 05/10/2023  Left 05/16/2023 Left 05/29/23  Shoulder flexion 131/ PROM 145 NT to surgery 04-16-23 PROM 85 100 PROM 120 PROM P: 132 deg painless  Shoulder extension 125/ PROM 142       Shoulder abduction   PROM 60   P: 102 deg mild transient pain  Shoulder adduction        Shoulder internal rotation        Shoulder external rotation   PROM 20 30 PROM    Elbow flexion        Elbow extension        Wrist flexion        Wrist extension        Wrist ulnar deviation        Wrist radial deviation        Wrist pronation        Wrist supination        (Blank rows = not tested)  UPPER EXTREMITY MMT:not formally assess at eval d/t ROM deficits and post-op status   MMT Right eval Left eval  Shoulder flexion  NT due to recent TSA surgery < 2 weeks  Shoulder extension    Shoulder abduction    Shoulder adduction    Shoulder internal rotation    Shoulder external rotation    Middle trapezius    Lower trapezius    Elbow flexion    Elbow extension    Wrist flexion    Wrist extension    Wrist ulnar deviation    Wrist radial deviation    Wrist pronation    Wrist supination    Grip strength (lbs)    (Blank rows = not tested)  SHOULDER SPECIAL TESTS: NT due to L TSA reverse  JOINT MOBILITY TESTING:  NT  PALPATION:  Pt with steri strips on surgical site well intact    TODAY'S TREATMENT [facility] Adult PT Treatment:                                                DATE: 05/29/23 Therapeutic Exercise: Supine punches 2x8  tactile cues to avoid shoulder ext at resting position Seated shoulder flexion w dowel AAROM x8, x5 (fatigue) Sidelying shoulder ER L GH to neutral only 2x8, x5 w/ towel support cues for appropriate positioning Passive shoulder flexion slides at counter x12 cues to avoid WB Passive shoulder scaption slides at counter x12 Yellow band row, standing; x8; tactile cues to avoid shoulder ext, cues for scap retraction  Manual Therapy: Supine; L GH PROM flex/abd/scaption/ER to pt tolerance  (ER to 30 deg only but painless)    [facility] Adult PT Treatment:                                                DATE: 05/23/23 Therapeutic Exercise: Supine dowel flexion AAROM x12 Supine flexion AROM 2x8 Sidelying shoulder ER AROM to neutral only 2x8, cues for shoulder positioning Scapular retraction in cradle position to avoid extension x20 Shoulder flexion isometric 3x8  Shoulder abduction isometric 3x8 HEP update + education  Manual Therapy: L GH shoulder elevation PROM to tolerance   [facility] Adult PT Treatment:                                                DATE: 05/21/23 Therapeutic Exercise: Supine punch 2x10  Supine shoulder flexion AROM to ~120 deg 2x12 Sidelying shoulder abduction AROM to ~ 90 deg 2x12  Scapular retraction (hands clasped to avoid shoulder ext) 2x15  Shoulder flexion iso at wall 2x10 cues for alignment  Shoulder abduction iso at wall 2x8  Manual Therapy: L GH shoulder elevation PROM to tolerance     PATIENT EDUCATION: Education details: rationale for interventions, HEP  Person educated: Patient Education method: Explanation, Demonstration, Tactile cues, Verbal cues Education comprehension: verbalized understanding, returned demonstration, verbal cues required, tactile cues required, and needs further education     HOME EXERCISE PROGRAM: Access Code: [email or web address]   ASSESSMENT: CLINICAL IMPRESSION: 05/29/2023 Pt arrives w/o pain, no issues after last session, states follow up with surgeon went well. Pt now 6 weeks post op and is progressing relatively well with PROM, today also progressing AAROM to upright position with good tolerance. Also tolerates addition of light banded periscapular work without any pain. Today pt is primarily limited by muscular fatigue/weakness, no increase in pain, no adverse events. Recommend continuing along current POC in order to address relevant deficits and improve functional tolerance. Pt departs today's session in no acute  distress, all voiced questions/concerns addressed appropriately from PT perspective.     EVAL: Patient is a 63 y.o. female who was seen today for physical therapy evaluation and treatment for 2 weeks s/p reverse total shoulder arthroplasty. L TSA on 04/16/23 by Dr Rise Paganini. Pt demonstrates decreased Left shoulder AROM, strength and also some decreased AROM of R shoulder from previous surgery. She does not complain of pain  on entering clinic but does have discomfort and  pain and difficulty with ADLs/IADLs, driving, sleep quality, and participation in normal daily activities especially yard work which she desires ot return.. She requires skilled PT services at this time to address all relevant deficits and return to PLOF   OBJECTIVE IMPAIRMENTS: decreased activity tolerance, decreased endurance, decreased ROM, decreased strength, impaired UE functional use, postural dysfunction, and pain.   ACTIVITY LIMITATIONS: carrying, lifting, sleeping, bathing, dressing, reach over head, and hygiene/grooming  PARTICIPATION LIMITATIONS: meal prep, cleaning, laundry, driving, shopping, community activity, and yard work  PERSONAL FACTORS: allergies, asthma, anemia, fibromyalgia, GERD, HLD, HTN, hypothyroidism, R TSA, bil TKA, basal cell face CA, R RTC before R TSA, bunion on R foot, hernia repair are also affecting patient's functional outcome.    GOALS: Goals reviewed with patient? Yes  SHORT TERM GOALS: Target date: 05-31-23  Pt will be independent with initial HEP Baseline: Goal status: INITIAL  2.  Pt pain level will not be greater than 4/10 with active motion Baseline: Pt post surgery 2 weeks, only isometric ex Goal status: INITIAL  3.  Pt will achieve 90 degrees flexion  AROM Baseline: NT due to post surgery eval Goal status: INITIAL  LONG TERM GOALS: Target date: 06-28-23  Pt will be independent with advanced HEP Baseline: needs reinforcement has had R TSR reverse Goal status:  INITIAL  2.  FOTO will improve from 32%  to   54% indicating improved functional mobility.  Baseline: eval 32% Goal status: INITIAL  3.  Left shoulder IR and ER will return to [facility] to return to 2/10 or less NPS for as dressing and grooming Baseline: Pt unable to dress without compensation and limited motion Goal status: INITIAL  4.  Pt will be able to sleep on Left side without awaking due to pain Baseline: Pt post surgery 04-16-23 avoided lying on left side but now initial discomfort with lying on left side  Goal status: INITIAL  5.  Left  shoulder AROM scaption will improve to minimally 0-140 degrees for improved overhead reaching Baseline: SEE AROM chart Goal status: INITIAL  6.  Patient will demonstrate ability to push/pull at least 40# with <3/10 pain in order to return to heavy household and yardwork tasks.  Baseline: post op 04-16-23, only light weight such as coffee cup Goal status: INITIAL   PLAN: PT FREQUENCY: 2x/week  PT DURATION: 8 weeks  PLANNED INTERVENTIONS: 97164- PT Re-evaluation, 97110-Therapeutic exercises, 97530- Therapeutic activity, 97112- Neuromuscular re-education, 97535- Self Care, 52841- Manual therapy, 97014- Electrical stimulation (unattended), [id number]- Electrical stimulation (manual), Patient/Family education, Taping, Dry Needling, Joint mobilization, Cryotherapy, and Moist heat  PLAN FOR NEXT SESSION: continue with isometrics, AAROM/AROM in gravity reduced positions. Avoiding IR/ext, no ER past 30 deg     Ashley Murrain PT, DPT 05/29/2023 5:05 PM

## 2023-05-28 NOTE — Progress Notes (Signed)
Post-Op Visit Note   Patient: Denise Morales           Date of Birth: [birth year]           MRN: [id number] Visit Date: 05/28/2023 PCP: Park Meo, FNP   Assessment & Plan:  Chief Complaint:  Chief Complaint  Patient presents with   Other    LEFT RSA (surgery date 04-16-23)   Visit Diagnoses:  1. History of arthroplasty of left shoulder     Plan: [name] is a 62 year old patient who underwent left reverse shoulder replacement 04/16/2023.  Still having little weakness when trying to grab things out in front of her.  She is in physical therapy.  Doing a pulley.  Sleeping in a recliner.  On exam she has range of motion of 50/95/135.  Range of motion strength is improving.  Plan at this time is continue with strengthening plus home exercise program in 2 months return for final clinical check.  Follow-Up Instructions: No follow-ups on file.   Orders:  No orders of the defined types were placed in this encounter.  No orders of the defined types were placed in this encounter.   Imaging: No results found.  PMFS History: Patient Active Problem List   Diagnosis Date Noted   Arthritis of left shoulder region 04/24/2023   Biceps tendonitis, left 04/24/2023   S/P reverse total shoulder arthroplasty, left 04/16/2023   Depression, recurrent (HCC) 04/03/2023   S/P repair of paraesophageal hernia 03/12/2023   Arthritis of right shoulder region 12/24/2022   Biceps tendonitis on right 12/24/2022   S/P reverse total shoulder arthroplasty, right 12/05/2022   Vertigo 11/27/2022   Iron deficiency anemia 10/30/2022   Physical exam, annual 10/30/2022   Medication management 10/30/2022   Class 2 severe obesity due to excess calories with serious comorbidity in adult, unspecified BMI (HCC) 10/23/2022   Essential hypertension 10/23/2022   Prediabetes 10/23/2022   Hypothyroidism 10/23/2022   Insomnia 06/14/2022   Status post total left knee replacement 07/15/2021   Neoplasm of  uncertain behavior of skin 05/25/2021   Generalized osteoarthritis of multiple sites 05/25/2021   Fibromyalgia syndrome 05/25/2021   Restless legs 03/16/2021   Hyperlipidemia 03/16/2021   Anxiety 03/16/2021   Status post right knee replacement 12/24/2020   Unilateral primary osteoarthritis, left knee 11/11/2020   Unilateral primary osteoarthritis, right knee 11/11/2020   Complete tear of right rotator cuff 08/12/2020   Hypertensive disorder 05/05/2019   Past Medical History:  Diagnosis Date   Allergy    Anemia    Anxiety    Arthritis    Depression    Fibromyalgia    GERD (gastroesophageal reflux disease)    Heart murmur    History of hiatal hernia    Hyperlipidemia    Hypertension    Hypothyroidism    Melanoma (HCC)    face    Family History  Problem Relation Age of Onset   Dementia Mother    Heart Problems Mother    Stroke Mother    Hypertension Mother    Hypertension Father    Colon polyps Father    Diabetes Father    Hypertension Sister    Diabetes Sister        borderline   Fibromyalgia Sister    Hypertension Sister    Colon polyps Sister    Diabetes Sister    Fibromyalgia Sister    Heart Problems Sister    Arthritis Sister    Hypertension Sister    Heart  Problems Sister    Healthy Daughter    Diabetes Paternal Aunt    Colon cancer Neg Hx    Esophageal cancer Neg Hx    Rectal cancer Neg Hx    Stomach cancer Neg Hx    Breast cancer Neg Hx     Past Surgical History:  Procedure Laterality Date   ABDOMINAL HYSTERECTOMY  1985   AIKEN OSTEOTOMY Right 06/05/2022   Procedure: Quintella Reichert OSTEOTOMY;  Surgeon: Candelaria Stagers, DPM;  Location: [facility];  Service: Podiatry;  Laterality: Right;   BICEPT TENODESIS Right 12/05/2022   Procedure: RIGHT BICEPS TENODESIS;  Surgeon: Cammy Copa, MD;  Location: [facility] OR;  Service: Orthopedics;  Laterality: Right;   CAPSULOTOMY METATARSOPHALANGEAL Right 06/05/2022   Procedure: CAPSULOTOMY  METATARSOPHALANGEAL;  Surgeon: Candelaria Stagers, DPM;  Location: [facility] [place];  Service: Podiatry;  Laterality: Right;   CHOLECYSTECTOMY     COLONOSCOPY     HALLUX VALGUS LAPIDUS Right 06/05/2022   Procedure: HALLUX VALGUS LAPIDUS;  Surgeon: Candelaria Stagers, DPM;  Location: [facility];  Service: Podiatry;  Laterality: Right;   HAMMER TOE SURGERY Right 06/05/2022   Procedure: HAMMER TOE CORRECTION;  Surgeon: Candelaria Stagers, DPM;  Location: [facility] [place];  Service: Podiatry;  Laterality: Right;   REVERSE SHOULDER ARTHROPLASTY Right 12/05/2022   Procedure: RIGHT REVERSE SHOULDER ARTHROPLASTY;  Surgeon: Cammy Copa, MD;  Location: [facility] OR;  Service: Orthopedics;  Laterality: Right;   REVERSE SHOULDER ARTHROPLASTY Left 04/16/2023   Procedure: REVERSE SHOULDER ARTHROPLASTY;  Surgeon: Cammy Copa, MD;  Location: [facility] OR;  Service: Orthopedics;  Laterality: Left;   SHOULDER ARTHROSCOPY WITH ROTATOR CUFF REPAIR AND SUBACROMIAL DECOMPRESSION Right 08/12/2020   Procedure: RIGHT SHOULDER ARTHROSCOPY WITH ROTATOR CUFF REPAIR AND SUBACROMIAL DECOMPRESSION;  Surgeon: Kathryne Hitch, MD;  Location: [facility];  Service: Orthopedics;  Laterality: Right;   TOTAL KNEE ARTHROPLASTY Right 12/24/2020   Procedure: RIGHT TOTAL KNEE ARTHROPLASTY;  Surgeon: Kathryne Hitch, MD;  Location: [facility];  Service: Orthopedics;  Laterality: Right;   TOTAL KNEE ARTHROPLASTY Left 07/15/2021   Procedure: LEFT TOTAL KNEE ARTHROPLASTY;  Surgeon: Kathryne Hitch, MD;  Location: [facility];  Service: Orthopedics;  Laterality: Left;   XI ROBOTIC ASSISTED PARAESOPHAGEAL HERNIA REPAIR N/A 03/12/2023   Procedure: XI ROBOTIC ASSISTED PARAESOPHAGEAL HERNIA REPAIR WITH FUNDOPLICATION;  Surgeon: Berna Bue, MD;  Location: [facility];  Service: General;  Laterality: N/A;  180   Social History   Occupational History   Not on file  Tobacco Use   Smoking  status: Never    Passive exposure: Past   Smokeless tobacco: Never  Vaping Use   Vaping status: Never Used  Substance and Sexual Activity   Alcohol use: Not Currently   Drug use: Never   Sexual activity: Not Currently    Birth control/protection: Surgical    Comment: Hysterectomy

## 2023-05-29 ENCOUNTER — Ambulatory Visit: Payer: Medicaid Other | Attending: Surgical | Admitting: Physical Therapy

## 2023-05-29 ENCOUNTER — Encounter: Payer: Self-pay | Admitting: Physical Therapy

## 2023-05-29 DIAGNOSIS — M6281 Muscle weakness (generalized): Secondary | ICD-10-CM | POA: Insufficient documentation

## 2023-05-29 DIAGNOSIS — M25512 Pain in left shoulder: Secondary | ICD-10-CM | POA: Insufficient documentation

## 2023-05-30 NOTE — Therapy (Signed)
OUTPATIENT PHYSICAL THERAPY SHOULDER TREATMENT   Patient Name: Denise Morales MRN: [id number] DOB:09-28-1960, 62 y.o., female Today's Date: 05/31/2023   END OF SESSION:  PT End of Session - 05/31/23 1613     Visit Number 9    Number of Visits 17    Date for PT Re-Evaluation 06/28/23    Authorization Type [employer]    Authorization Time Period 05/07/23 - 08/05/23    Authorization - Visit Number 8    Authorization - Number of Visits 15    PT Start Time 1615    PT Stop Time 1658    PT Time Calculation (min) 43 min    Activity Tolerance Patient tolerated treatment well    Behavior During Therapy WFL for tasks assessed/performed                    Past Medical History:  Diagnosis Date   Allergy    Anemia    Anxiety    Arthritis    Depression    Fibromyalgia    GERD (gastroesophageal reflux disease)    Heart murmur    History of hiatal hernia    Hyperlipidemia    Hypertension    Hypothyroidism    Melanoma (HCC)    face   Past Surgical History:  Procedure Laterality Date   ABDOMINAL HYSTERECTOMY  1985   AIKEN OSTEOTOMY Right 06/05/2022   Procedure: Quintella Reichert OSTEOTOMY;  Surgeon: Candelaria Stagers, DPM;  Location: [facility];  Service: Podiatry;  Laterality: Right;   BICEPT TENODESIS Right 12/05/2022   Procedure: RIGHT BICEPS TENODESIS;  Surgeon: Cammy Copa, MD;  Location: [facility] OR;  Service: Orthopedics;  Laterality: Right;   CAPSULOTOMY METATARSOPHALANGEAL Right 06/05/2022   Procedure: CAPSULOTOMY METATARSOPHALANGEAL;  Surgeon: Candelaria Stagers, DPM;  Location: [facility] [place];  Service: Podiatry;  Laterality: Right;   CHOLECYSTECTOMY     COLONOSCOPY     HALLUX VALGUS LAPIDUS Right 06/05/2022   Procedure: HALLUX VALGUS LAPIDUS;  Surgeon: Candelaria Stagers, DPM;  Location:  [place];  Service: Podiatry;  Laterality: Right;   HAMMER TOE SURGERY Right 06/05/2022   Procedure: HAMMER TOE CORRECTION;  Surgeon: Candelaria Stagers, DPM;  Location: [facility] ;  Service: Podiatry;  Laterality: Right;   REVERSE SHOULDER ARTHROPLASTY Right 12/05/2022   Procedure: RIGHT REVERSE SHOULDER ARTHROPLASTY;  Surgeon: Cammy Copa, MD;  Location: [facility] OR;  Service: Orthopedics;  Laterality: Right;   REVERSE SHOULDER ARTHROPLASTY Left 04/16/2023   Procedure: REVERSE SHOULDER ARTHROPLASTY;  Surgeon: Cammy Copa, MD;  Location: [facility] OR;  Service: Orthopedics;  Laterality: Left;   SHOULDER ARTHROSCOPY WITH ROTATOR CUFF REPAIR AND SUBACROMIAL DECOMPRESSION Right 08/12/2020   Procedure: RIGHT SHOULDER ARTHROSCOPY WITH ROTATOR CUFF REPAIR AND SUBACROMIAL DECOMPRESSION;  Surgeon: Kathryne Hitch, MD;  Location: [facility];  Service: Orthopedics;  Laterality: Right;   TOTAL KNEE ARTHROPLASTY Right 12/24/2020   Procedure: RIGHT TOTAL KNEE ARTHROPLASTY;  Surgeon: Kathryne Hitch, MD;  Location: [facility];  Service: Orthopedics;  Laterality: Right;   TOTAL KNEE ARTHROPLASTY Left 07/15/2021   Procedure: LEFT TOTAL KNEE ARTHROPLASTY;  Surgeon: Kathryne Hitch, MD;  Location: [facility];  Service: Orthopedics;  Laterality: Left;   XI ROBOTIC ASSISTED PARAESOPHAGEAL HERNIA REPAIR N/A 03/12/2023   Procedure: XI ROBOTIC ASSISTED PARAESOPHAGEAL HERNIA REPAIR WITH FUNDOPLICATION;  Surgeon: Berna Bue, MD;  Location: [facility];  Service: General;  Laterality: N/A;  180   Patient Active Problem  List   Diagnosis Date Noted   Arthritis of left shoulder region 04/24/2023   Biceps tendonitis, left 04/24/2023   S/P reverse total shoulder arthroplasty, left 04/16/2023   Depression, recurrent (HCC) 04/03/2023   S/P repair of paraesophageal hernia 03/12/2023   Arthritis of right shoulder region 12/24/2022   Biceps tendonitis on right 12/24/2022   S/P reverse total shoulder arthroplasty, right 12/05/2022   Vertigo 11/27/2022   Iron deficiency anemia 10/30/2022   Physical exam, annual  10/30/2022   Medication management 10/30/2022   Class 2 severe obesity due to excess calories with serious comorbidity in adult, unspecified BMI (HCC) 10/23/2022   Essential hypertension 10/23/2022   Prediabetes 10/23/2022   Hypothyroidism 10/23/2022   Insomnia 06/14/2022   Status post total left knee replacement 07/15/2021   Neoplasm of uncertain behavior of skin 05/25/2021   Generalized osteoarthritis of multiple sites 05/25/2021   Fibromyalgia syndrome 05/25/2021   Restless legs 03/16/2021   Hyperlipidemia 03/16/2021   Anxiety 03/16/2021   Status post right knee replacement 12/24/2020   Unilateral primary osteoarthritis, left knee 11/11/2020   Unilateral primary osteoarthritis, right knee 11/11/2020   Complete tear of right rotator cuff 08/12/2020   Hypertensive disorder 05/05/2019    PCP: Park Meo, FNP   REFERRING PROVIDER: Park Meo, FNP   REFERRING DIAG: [phone] (ICD-10-CM) - History of arthroplasty of left shoulder   THERAPY DIAG:  Acute pain of left shoulder  Muscle weakness (generalized)  Rationale for Evaluation and Treatment: Rehabilitation  ONSET DATE: L reverse TSA on 04/16/23   SUBJECTIVE:                                                                                                                                                                                     SUBJECTIVE STATEMENT:  05/31/2023 a little sore for the evening after last session. Felt good overall, no increase in pain. Did some work around the yard and decorating yesterday without issue. No pain at present.    EVAL: I have been using CPM machine and doing light activities in the home like washing cups and not trying to carry anything heavy, L TSA on 10/21/ 24 by Dr August Saucer.  MD as DC sling. I feel like it is a toothache pain last night. I would like to be able to return to yard work and my mud room. I am having to maneuver clothes so I can try to dress myself.  I have been here  before with Lelon Mast last time  PERTINENT HISTORY: allergies, asthma, anemia, fibromyalgia, GERD, HLD, HTN, hypothyroidism, R TSA, bil TKA, basal cell face CA, R RTC before R TSA, bunion on R foot,  hernia repair,    PAIN:  Are you having pain? NPRS scale: 0/10 Pain location: Left shoulder Pain description: Achy Aggravating factors: lying on shoulder initially hurts but then adjusts.  Cannot drive,  I can shower but I assist my left arm with my right, I compensate and tie my bra in the front to dress.  Relieving factors: Resting Toothache pain  PRECAUTIONS: Per 05/28/23 Dr. August Saucer note: "plan at this time is continue with strengthening plus home exercise program"  RED FLAGS: None   WEIGHT BEARING RESTRICTIONS: Yes No weight bearing until released for AROM next visit  FALLS:  Has patient fallen in last 6 months?   YES  slipped on deck steps but no issues other than that time.  LIVING ENVIRONMENT: Lives with: lives with their family and including dtr SIL and grandkids Lives in: House/apartment Stairs: Yes: External: 5 steps; can reach both Has following equipment at home: Single point cane, Walker - 2 wheeled, Wheelchair (manual), Shower bench, and bed side commode  OCCUPATION: Retired in Community education officer   PLOF: Independent  PATIENT GOALS:To use my arm with out limitation   OBJECTIVE:  Note: Objective measures were completed at Evaluation unless otherwise noted. PATIENT SURVEYS:  FOTO 32%  predicted 54% FOTO 05/21/23 50%   POSTURE: Rounded shoulders  Right shoulder more forward than Left  UPPER EXTREMITY ROM:   Active ROM Right eval Left eval Left 05/08/23 Left 05/10/2023 Left 05/16/2023 Left 05/29/23  Shoulder flexion 131/ PROM 145 NT to surgery 04-16-23 PROM 85 100 PROM 120 PROM P: 132 deg painless  Shoulder extension 125/ PROM 142       Shoulder abduction   PROM 60   P: 102 deg mild transient pain  Shoulder adduction        Shoulder internal rotation        Shoulder  external rotation   PROM 20 30 PROM    Elbow flexion        Elbow extension        Wrist flexion        Wrist extension        Wrist ulnar deviation        Wrist radial deviation        Wrist pronation        Wrist supination        (Blank rows = not tested)  UPPER EXTREMITY MMT:not formally assess at eval d/t ROM deficits and post-op status   MMT Right eval Left eval  Shoulder flexion  NT due to recent TSA surgery < 2 weeks  Shoulder extension    Shoulder abduction    Shoulder adduction    Shoulder internal rotation    Shoulder external rotation    Middle trapezius    Lower trapezius    Elbow flexion    Elbow extension    Wrist flexion    Wrist extension    Wrist ulnar deviation    Wrist radial deviation    Wrist pronation    Wrist supination    Grip strength (lbs)    (Blank rows = not tested)  SHOULDER SPECIAL TESTS: NT due to L TSA reverse  JOINT MOBILITY TESTING:  NT  PALPATION:  Pt with steri strips on surgical site well intact    TODAY'S TREATMENT [facility] Adult PT Treatment:  DATE: 05/31/23 Therapeutic Exercise: Supine punches x12 Supine flexion AROM x8 cues for comfortable ROM Finger ladder flexion AAROM x8 (PR 19) cues to minimize shrug UE ranger flexion AAROM x6 cues for comfortable ROM and minimizing shrug Passive shoulder flexion slide at table x15 Passive shoulder scaption slide at table x15 Wall slides x5 cues for pacing  HEP update + education  Manual Therapy: Supine PROM L GH to pt tolerance, flex/abd/scaption. ER ~30    [facility] Adult PT Treatment:                                                DATE: 05/29/23 Therapeutic Exercise: Supine punches 2x8  tactile cues to avoid shoulder ext at resting position Seated shoulder flexion w dowel AAROM x8, x5 (fatigue) Sidelying shoulder ER L GH to neutral only 2x8, x5 w/ towel support cues for appropriate positioning Passive shoulder flexion slides at  counter x12 cues to avoid WB Passive shoulder scaption slides at counter x12 Yellow band row, standing; x8; tactile cues to avoid shoulder ext, cues for scap retraction  Manual Therapy: Supine; L GH PROM flex/abd/scaption/ER to pt tolerance (ER to 30 deg only but painless)    [facility] Adult PT Treatment:                                                DATE: 05/23/23 Therapeutic Exercise: Supine dowel flexion AAROM x12 Supine flexion AROM 2x8 Sidelying shoulder ER AROM to neutral only 2x8, cues for shoulder positioning Scapular retraction in cradle position to avoid extension x20 Shoulder flexion isometric 3x8  Shoulder abduction isometric 3x8 HEP update + education  Manual Therapy: L GH shoulder elevation PROM to tolerance      PATIENT EDUCATION: Education details: rationale for interventions, HEP  Person educated: Patient Education method: Explanation, Demonstration, Tactile cues, Verbal cues Education comprehension: verbalized understanding, returned demonstration, verbal cues required, tactile cues required, and needs further education     HOME EXERCISE PROGRAM: Access Code: [email or web address]   ASSESSMENT: CLINICAL IMPRESSION: 05/31/2023 Pt arrives w/o pain, no issues after last session, is doing well increasing activities at home. Today increasing volume with GH elevation AAROM which pt tolerates well, primary report of muscular fatigue. No increase in pain on departure, no adverse events. HEP update to include standing AAROM with education on appropriate pacing and monitoring of symptoms. Recommend continuing along current POC in order to address relevant deficits and improve functional tolerance. Pt departs today's session in no acute distress, all voiced questions/concerns addressed appropriately from PT perspective.      EVAL: Patient is a 62 y.o. female who was seen today for physical therapy evaluation and treatment for 2 weeks s/p reverse total shoulder arthroplasty. L TSA on  04/16/23 by Dr Rise Paganini. Pt demonstrates decreased Left shoulder AROM, strength and also some decreased AROM of R shoulder from previous surgery. She does not complain of pain on entering clinic but does have discomfort and  pain and difficulty with ADLs/IADLs, driving, sleep quality, and participation in normal daily activities especially yard work which she desires ot return.. She requires skilled PT services at this time to address all relevant deficits and return to PLOF   OBJECTIVE IMPAIRMENTS: decreased activity tolerance, decreased endurance,  decreased ROM, decreased strength, impaired UE functional use, postural dysfunction, and pain.   ACTIVITY LIMITATIONS: carrying, lifting, sleeping, bathing, dressing, reach over head, and hygiene/grooming  PARTICIPATION LIMITATIONS: meal prep, cleaning, laundry, driving, shopping, community activity, and yard work  PERSONAL FACTORS: allergies, asthma, anemia, fibromyalgia, GERD, HLD, HTN, hypothyroidism, R TSA, bil TKA, basal cell face CA, R RTC before R TSA, bunion on R foot, hernia repair are also affecting patient's functional outcome.    GOALS: Goals reviewed with patient? Yes  SHORT TERM GOALS: Target date: 05-31-23  Pt will be independent with initial HEP Baseline: 05/31/23: pt reports good performance of HEP overall Goal status: MET  2.  Pt pain level will not be greater than 4/10 with active motion Baseline: Pt post surgery 2 weeks, only isometric ex 05/31/23: no pain with movement, just heaviness Goal status: MET  3.  Pt will achieve 90 degrees flexion  AROM Baseline: NT due to post surgery eval 05/31/23: able to achieve >90 deg gravity eliminated AROM Goal status: PARTIALLY MET  LONG TERM GOALS: Target date: 06-28-23  Pt will be independent with advanced HEP Baseline: needs reinforcement has had R TSR reverse Goal status: INITIAL  2.  FOTO will improve from 32%  to   54% indicating improved functional mobility.  Baseline:  eval 32% Goal status: INITIAL  3.  Left shoulder IR and ER will return to [facility] to return to 2/10 or less NPS for as dressing and grooming Baseline: Pt unable to dress without compensation and limited motion Goal status: INITIAL  4.  Pt will be able to sleep on Left side without awaking due to pain Baseline: Pt post surgery 04-16-23 avoided lying on left side but now initial discomfort with lying on left side  Goal status: INITIAL  5.  Left  shoulder AROM scaption will improve to minimally 0-140 degrees for improved overhead reaching Baseline: SEE AROM chart Goal status: INITIAL  6.  Patient will demonstrate ability to push/pull at least 40# with <3/10 pain in order to return to heavy household and yardwork tasks.  Baseline: post op 04-16-23, only light weight such as coffee cup Goal status: INITIAL   PLAN: PT FREQUENCY: 2x/week  PT DURATION: 8 weeks  PLANNED INTERVENTIONS: 97164- PT Re-evaluation, 97110-Therapeutic exercises, 97530- Therapeutic activity, 97112- Neuromuscular re-education, 97535- Self Care, 16109- Manual therapy, 97014- Electrical stimulation (unattended), [id number]- Electrical stimulation (manual), Patient/Family education, Taping, Dry Needling, Joint mobilization, Cryotherapy, and Moist heat  PLAN FOR NEXT SESSION: continue with isometrics, AAROM/AROM in standing positions. Avoiding IR/ext, no ER past 30 deg     Ashley Murrain PT, DPT 05/31/2023 5:15 PM

## 2023-05-31 ENCOUNTER — Ambulatory Visit: Payer: Medicaid Other | Admitting: Physical Therapy

## 2023-05-31 ENCOUNTER — Encounter: Payer: Self-pay | Admitting: Physical Therapy

## 2023-05-31 DIAGNOSIS — M25512 Pain in left shoulder: Secondary | ICD-10-CM

## 2023-05-31 DIAGNOSIS — M6281 Muscle weakness (generalized): Secondary | ICD-10-CM

## 2023-06-04 ENCOUNTER — Other Ambulatory Visit: Payer: Self-pay | Admitting: Family Medicine

## 2023-06-04 ENCOUNTER — Encounter (HOSPITAL_COMMUNITY): Payer: Self-pay

## 2023-06-04 ENCOUNTER — Ambulatory Visit (INDEPENDENT_AMBULATORY_CARE_PROVIDER_SITE_OTHER): Payer: Medicaid Other | Admitting: Clinical

## 2023-06-04 DIAGNOSIS — F331 Major depressive disorder, recurrent, moderate: Secondary | ICD-10-CM

## 2023-06-04 DIAGNOSIS — F4321 Adjustment disorder with depressed mood: Secondary | ICD-10-CM

## 2023-06-05 ENCOUNTER — Encounter: Payer: Self-pay | Admitting: Physical Therapy

## 2023-06-05 ENCOUNTER — Ambulatory Visit: Payer: Medicaid Other | Admitting: Physical Therapy

## 2023-06-05 DIAGNOSIS — M25512 Pain in left shoulder: Secondary | ICD-10-CM | POA: Diagnosis not present

## 2023-06-05 DIAGNOSIS — M6281 Muscle weakness (generalized): Secondary | ICD-10-CM

## 2023-06-05 NOTE — Therapy (Signed)
OUTPATIENT PHYSICAL THERAPY SHOULDER TREATMENT   Patient Name: Denise Morales MRN: [id number] DOB:[birth year], 62 y.o., female Today's Date: 06/05/2023   END OF SESSION:  PT End of Session - 06/05/23 1310     Visit Number 10    Number of Visits 17    Date for PT Re-Evaluation 06/28/23    Authorization Type [employer]    Authorization Time Period 05/07/23 - 08/05/23    Authorization - Visit Number 9    Authorization - Number of Visits 15    PT Start Time 1312    PT Stop Time 1353    PT Time Calculation (min) 41 min    Activity Tolerance Patient tolerated treatment well    Behavior During Therapy WFL for tasks assessed/performed                     Past Medical History:  Diagnosis Date   Allergy    Anemia    Anxiety    Arthritis    Depression    Fibromyalgia    GERD (gastroesophageal reflux disease)    Heart murmur    History of hiatal hernia    Hyperlipidemia    Hypertension    Hypothyroidism    Melanoma (HCC)    face   Past Surgical History:  Procedure Laterality Date   ABDOMINAL HYSTERECTOMY  1985   AIKEN OSTEOTOMY Right 06/05/2022   Procedure: Quintella Reichert OSTEOTOMY;  Surgeon: Candelaria Stagers, DPM;  Location: [facility];  Service: Podiatry;  Laterality: Right;   BICEPT TENODESIS Right 12/05/2022   Procedure: RIGHT BICEPS TENODESIS;  Surgeon: Cammy Copa, MD;  Location: [facility];  Service: Orthopedics;  Laterality: Right;   CAPSULOTOMY METATARSOPHALANGEAL Right 06/05/2022   Procedure: CAPSULOTOMY METATARSOPHALANGEAL;  Surgeon: Candelaria Stagers, DPM;  Location: [facility];  Service: Podiatry;  Laterality: Right;   CHOLECYSTECTOMY     COLONOSCOPY     HALLUX VALGUS LAPIDUS Right 06/05/2022   Procedure: HALLUX VALGUS LAPIDUS;  Surgeon: Candelaria Stagers, DPM;  Location: [facility];  Service: Podiatry;  Laterality: Right;   HAMMER TOE SURGERY Right 06/05/2022   Procedure: HAMMER TOE CORRECTION;  Surgeon:  Candelaria Stagers, DPM;  Location: [facility];  Service: Podiatry;  Laterality: Right;   REVERSE SHOULDER ARTHROPLASTY Right 12/05/2022   Procedure: RIGHT REVERSE SHOULDER ARTHROPLASTY;  Surgeon: Cammy Copa, MD;  Location: [facility] OR;  Service: Orthopedics;  Laterality: Right;   REVERSE SHOULDER ARTHROPLASTY Left 04/16/2023   Procedure: REVERSE SHOULDER ARTHROPLASTY;  Surgeon: Cammy Copa, MD;  Location: [facility];  Service: Orthopedics;  Laterality: Left;   SHOULDER ARTHROSCOPY WITH ROTATOR CUFF REPAIR AND SUBACROMIAL DECOMPRESSION Right 08/12/2020   Procedure: RIGHT SHOULDER ARTHROSCOPY WITH ROTATOR CUFF REPAIR AND SUBACROMIAL DECOMPRESSION;  Surgeon: Kathryne Hitch, MD;  Location: [facility];  Service: Orthopedics;  Laterality: Right;   TOTAL KNEE ARTHROPLASTY Right 12/24/2020   Procedure: RIGHT TOTAL KNEE ARTHROPLASTY;  Surgeon: Kathryne Hitch, MD;  Location: [facility];  Service: Orthopedics;  Laterality: Right;   TOTAL KNEE ARTHROPLASTY Left 07/15/2021   Procedure: LEFT TOTAL KNEE ARTHROPLASTY;  Surgeon: Kathryne Hitch, MD;  Location: [facility];  Service: Orthopedics;  Laterality: Left;   XI ROBOTIC ASSISTED PARAESOPHAGEAL HERNIA REPAIR N/A 03/12/2023   Procedure: XI ROBOTIC ASSISTED PARAESOPHAGEAL HERNIA REPAIR WITH FUNDOPLICATION;  Surgeon: Berna Bue, MD;  Location: [facility];  Service: General;  Laterality: N/A;  180   Patient Active  Problem List   Diagnosis Date Noted   Arthritis of left shoulder region 04/24/2023   Biceps tendonitis, left 04/24/2023   S/P reverse total shoulder arthroplasty, left 04/16/2023   Depression, recurrent (HCC) 04/03/2023   S/P repair of paraesophageal hernia 03/12/2023   Arthritis of right shoulder region 12/24/2022   Biceps tendonitis on right 12/24/2022   S/P reverse total shoulder arthroplasty, right 12/05/2022   Vertigo 11/27/2022   Iron deficiency anemia 10/30/2022   Physical exam, annual  10/30/2022   Medication management 10/30/2022   Class 2 severe obesity due to excess calories with serious comorbidity in adult, unspecified BMI (HCC) 10/23/2022   Essential hypertension 10/23/2022   Prediabetes 10/23/2022   Hypothyroidism 10/23/2022   Insomnia 06/14/2022   Status post total left knee replacement 07/15/2021   Neoplasm of uncertain behavior of skin 05/25/2021   Generalized osteoarthritis of multiple sites 05/25/2021   Fibromyalgia syndrome 05/25/2021   Restless legs 03/16/2021   Hyperlipidemia 03/16/2021   Anxiety 03/16/2021   Status post right knee replacement 12/24/2020   Unilateral primary osteoarthritis, left knee 11/11/2020   Unilateral primary osteoarthritis, right knee 11/11/2020   Complete tear of right rotator cuff 08/12/2020   Hypertensive disorder 05/05/2019    PCP: Park Meo, FNP   REFERRING PROVIDER: Park Meo, FNP   REFERRING DIAG: [phone] (ICD-10-CM) - History of arthroplasty of left shoulder   THERAPY DIAG:  Acute pain of left shoulder  Muscle weakness (generalized)  Rationale for Evaluation and Treatment: Rehabilitation  ONSET DATE: L reverse TSA on 04/16/23   SUBJECTIVE:                                                                                                                                                                                     SUBJECTIVE STATEMENT:  06/05/2023 1-2/10 pain, described as tightness in posterior shoulder - states she did a lot this weekend with decorating and work around the house. Also picked up a case of water bottles yesterday. No sharp pain. HEP going well. No new updates otherwise    EVAL: I have been using CPM machine and doing light activities in the home like washing cups and not trying to carry anything heavy, L TSA on 10/21/ 24 by Dr August Saucer.  MD as DC sling. I feel like it is a toothache pain last night. I would like to be able to return to yard work and my mud room. I am having to  maneuver clothes so I can try to dress myself.  I have been here before with Lelon Mast last time  PERTINENT HISTORY: allergies, asthma, anemia, fibromyalgia, GERD, HLD, HTN, hypothyroidism, R TSA, bil TKA, basal  cell face CA, R RTC before R TSA, bunion on R foot, hernia repair,    PAIN:  Are you having pain? NPRS scale: 0/10 Pain location: Left shoulder Pain description: Achy Aggravating factors: lying on shoulder initially hurts but then adjusts.  Cannot drive,  I can shower but I assist my left arm with my right, I compensate and tie my bra in the front to dress.  Relieving factors: Resting Toothache pain  PRECAUTIONS: Per 05/28/23 Dr. August Saucer note: "plan at this time is continue with strengthening plus home exercise program"  RED FLAGS: None   WEIGHT BEARING RESTRICTIONS: Yes No weight bearing until released for AROM next visit  FALLS:  Has patient fallen in last 6 months?   YES  slipped on deck steps but no issues other than that time.  LIVING ENVIRONMENT: Lives with: lives with their family and including dtr SIL and grandkids Lives in: House/apartment Stairs: Yes: External: 5 steps; can reach both Has following equipment at home: Single point cane, Walker - 2 wheeled, Wheelchair (manual), Shower bench, and bed side commode  OCCUPATION: Retired in Community education officer   PLOF: Independent  PATIENT GOALS:To use my arm with out limitation   OBJECTIVE:  Note: Objective measures were completed at Evaluation unless otherwise noted. PATIENT SURVEYS:  FOTO 32%  predicted 54% FOTO 05/21/23 50%   POSTURE: Rounded shoulders  Right shoulder more forward than Left  UPPER EXTREMITY ROM:   Active ROM Right eval Left eval Left 05/08/23 Left 05/10/2023 Left 05/16/2023 Left 05/29/23  Shoulder flexion 131/ PROM 145 NT to surgery 04-16-23 PROM 85 100 PROM 120 PROM P: 132 deg painless  Shoulder extension 125/ PROM 142       Shoulder abduction   PROM 60   P: 102 deg mild transient pain   Shoulder adduction        Shoulder internal rotation        Shoulder external rotation   PROM 20 30 PROM    Elbow flexion        Elbow extension        Wrist flexion        Wrist extension        Wrist ulnar deviation        Wrist radial deviation        Wrist pronation        Wrist supination        (Blank rows = not tested)  UPPER EXTREMITY MMT:not formally assess at eval d/t ROM deficits and post-op status   MMT Right eval Left eval  Shoulder flexion  NT due to recent TSA surgery < 2 weeks  Shoulder extension    Shoulder abduction    Shoulder adduction    Shoulder internal rotation    Shoulder external rotation    Middle trapezius    Lower trapezius    Elbow flexion    Elbow extension    Wrist flexion    Wrist extension    Wrist ulnar deviation    Wrist radial deviation    Wrist pronation    Wrist supination    Grip strength (lbs)    (Blank rows = not tested)  SHOULDER SPECIAL TESTS: NT due to L TSA reverse  JOINT MOBILITY TESTING:  NT  PALPATION:  Pt with steri strips on surgical site well intact    TODAY'S TREATMENT [facility] Adult PT Treatment:  DATE: 06/05/23 Therapeutic Exercise: Supine punch x8 (elbow supported to prevent extension at resting position) Shoulder flexion AROM x8 supine (elbow supported to prevent extension at resting position) Finger ladder flexion AAROM x6 Finger ladder scaption AAROM x5 Standing shoulder flexion at table w/ swiss ball x10 Standing shoulder scaption AAROM at table w/ swiss ball x8  Manual Therapy: Supine; PROM to pt tolerance for flex/abduction, ER to ~30deg without pain    [facility] Adult PT Treatment:                                                DATE: 05/31/23 Therapeutic Exercise: Supine punches x12 Supine flexion AROM x8 cues for comfortable ROM Finger ladder flexion AAROM x8 (PR 19) cues to minimize shrug UE ranger flexion AAROM x6 cues for comfortable ROM and  minimizing shrug Passive shoulder flexion slide at table x15 Passive shoulder scaption slide at table x15 Wall slides x5 cues for pacing  HEP update + education  Manual Therapy: Supine PROM L GH to pt tolerance, flex/abd/scaption. ER ~30    [facility] Adult PT Treatment:                                                DATE: 05/29/23 Therapeutic Exercise: Supine punches 2x8  tactile cues to avoid shoulder ext at resting position Seated shoulder flexion w dowel AAROM x8, x5 (fatigue) Sidelying shoulder ER L GH to neutral only 2x8, x5 w/ towel support cues for appropriate positioning Passive shoulder flexion slides at counter x12 cues to avoid WB Passive shoulder scaption slides at counter x12 Yellow band row, standing; x8; tactile cues to avoid shoulder ext, cues for scap retraction  Manual Therapy: Supine; L GH PROM flex/abd/scaption/ER to pt tolerance (ER to 30 deg only but painless)   PATIENT EDUCATION: Education details: rationale for interventions, HEP  Person educated: Patient Education method: Explanation, Demonstration, Tactile cues, Verbal cues Education comprehension: verbalized understanding, returned demonstration, verbal cues required, tactile cues required, and needs further education     HOME EXERCISE PROGRAM: Access Code: [email or web address]   ASSESSMENT: CLINICAL IMPRESSION: 06/05/2023 Pt arrives w/ 1-2/10 pain on NPS after increased activities yesterday and over weekend. Continues to do well, reporting relief w/ manual and no increase in pain compared to prior sessions. AAROM and supine AROM remains limited primarily by report of heaviness/fatigue but no overt increase in pain. Reports symptoms unchanged on departure, does have some muscular fatigue, no adverse events. Recommend continuing along current POC in order to address relevant deficits and improve functional tolerance. Pt departs today's session in no acute distress, all voiced questions/concerns addressed appropriately from  PT perspective.     EVAL: Patient is a 62 y.o. female who was seen today for physical therapy evaluation and treatment for 2 weeks s/p reverse total shoulder arthroplasty. L TSA on 04/16/23 by Dr Rise Paganini. Pt demonstrates decreased Left shoulder AROM, strength and also some decreased AROM of R shoulder from previous surgery. She does not complain of pain on entering clinic but does have discomfort and  pain and difficulty with ADLs/IADLs, driving, sleep quality, and participation in normal daily activities especially yard work which she desires ot return.. She requires skilled PT services at this time to address  all relevant deficits and return to PLOF   OBJECTIVE IMPAIRMENTS: decreased activity tolerance, decreased endurance, decreased ROM, decreased strength, impaired UE functional use, postural dysfunction, and pain.   ACTIVITY LIMITATIONS: carrying, lifting, sleeping, bathing, dressing, reach over head, and hygiene/grooming  PARTICIPATION LIMITATIONS: meal prep, cleaning, laundry, driving, shopping, community activity, and yard work  PERSONAL FACTORS: allergies, asthma, anemia, fibromyalgia, GERD, HLD, HTN, hypothyroidism, R TSA, bil TKA, basal cell face CA, R RTC before R TSA, bunion on R foot, hernia repair are also affecting patient's functional outcome.    GOALS: Goals reviewed with patient? Yes  SHORT TERM GOALS: Target date: 05-31-23  Pt will be independent with initial HEP Baseline: 05/31/23: pt reports good performance of HEP overall Goal status: MET  2.  Pt pain level will not be greater than 4/10 with active motion Baseline: Pt post surgery 2 weeks, only isometric ex 05/31/23: no pain with movement, just heaviness Goal status: MET  3.  Pt will achieve 90 degrees flexion  AROM Baseline: NT due to post surgery eval 05/31/23: able to achieve >90 deg gravity eliminated AROM Goal status: PARTIALLY MET  LONG TERM GOALS: Target date: 06-28-23  Pt will be independent with  advanced HEP Baseline: needs reinforcement has had R TSR reverse Goal status: INITIAL  2.  FOTO will improve from 32%  to   54% indicating improved functional mobility.  Baseline: eval 32% Goal status: INITIAL  3.  Left shoulder IR and ER will return to [facility] to return to 2/10 or less NPS for as dressing and grooming Baseline: Pt unable to dress without compensation and limited motion Goal status: INITIAL  4.  Pt will be able to sleep on Left side without awaking due to pain Baseline: Pt post surgery 04-16-23 avoided lying on left side but now initial discomfort with lying on left side  Goal status: INITIAL  5.  Left  shoulder AROM scaption will improve to minimally 0-140 degrees for improved overhead reaching Baseline: SEE AROM chart Goal status: INITIAL  6.  Patient will demonstrate ability to push/pull at least 40# with <3/10 pain in order to return to heavy household and yardwork tasks.  Baseline: post op 04-16-23, only light weight such as coffee cup Goal status: INITIAL   PLAN: PT FREQUENCY: 2x/week  PT DURATION: 8 weeks  PLANNED INTERVENTIONS: 97164- PT Re-evaluation, 97110-Therapeutic exercises, 97530- Therapeutic activity, 97112- Neuromuscular re-education, 97535- Self Care, 32951- Manual therapy, 97014- Electrical stimulation (unattended), [id number]- Electrical stimulation (manual), Patient/Family education, Taping, Dry Needling, Joint mobilization, Cryotherapy, and Moist heat  PLAN FOR NEXT SESSION: continue with isometrics, AAROM/AROM in standing positions. Avoiding IR/ext, no ER past 30 deg     Ashley Murrain PT, DPT 06/05/2023 1:56 PM

## 2023-06-06 NOTE — Therapy (Signed)
OUTPATIENT PHYSICAL THERAPY SHOULDER TREATMENT   Patient Name: Denise Morales MRN: [id number] DOB:September 11, 1960, 62 y.o., female Today's Date: 06/07/2023   END OF SESSION:  PT End of Session - 06/07/23 1403     Visit Number 11    Number of Visits 17    Date for PT Re-Evaluation 06/28/23    Authorization Type [employer]    Authorization Time Period 05/07/23 - 08/05/23    Authorization - Visit Number 10    Authorization - Number of Visits 15    PT Start Time 1403    PT Stop Time 1442    PT Time Calculation (min) 39 min    Activity Tolerance Patient tolerated treatment well    Behavior During Therapy WFL for tasks assessed/performed                      Past Medical History:  Diagnosis Date   Allergy    Anemia    Anxiety    Arthritis    Depression    Fibromyalgia    GERD (gastroesophageal reflux disease)    Heart murmur    History of hiatal hernia    Hyperlipidemia    Hypertension    Hypothyroidism    Melanoma (HCC)    face   Past Surgical History:  Procedure Laterality Date   ABDOMINAL HYSTERECTOMY  1985   AIKEN OSTEOTOMY Right 06/05/2022   Procedure: Quintella Reichert OSTEOTOMY;  Surgeon: Candelaria Stagers, DPM;  Location: [facility];  Service: Podiatry;  Laterality: Right;   BICEPT TENODESIS Right 12/05/2022   Procedure: RIGHT BICEPS TENODESIS;  Surgeon: Cammy Copa, MD;  Location: [facility] OR;  Service: Orthopedics;  Laterality: Right;   CAPSULOTOMY METATARSOPHALANGEAL Right 06/05/2022   Procedure: CAPSULOTOMY METATARSOPHALANGEAL;  Surgeon: Candelaria Stagers, DPM;  Location: [facility];  Service: Podiatry;  Laterality: Right;   CHOLECYSTECTOMY     COLONOSCOPY     HALLUX VALGUS LAPIDUS Right 06/05/2022   Procedure: HALLUX VALGUS LAPIDUS;  Surgeon: Candelaria Stagers, DPM;  Location: [facility];  Service: Podiatry;  Laterality: Right;   HAMMER TOE SURGERY Right 06/05/2022   Procedure: HAMMER TOE CORRECTION;  Surgeon:  Candelaria Stagers, DPM;  Location: [facility];  Service: Podiatry;  Laterality: Right;   REVERSE SHOULDER ARTHROPLASTY Right 12/05/2022   Procedure: RIGHT REVERSE SHOULDER ARTHROPLASTY;  Surgeon: Cammy Copa, MD;  Location: [facility] OR;  Service: Orthopedics;  Laterality: Right;   REVERSE SHOULDER ARTHROPLASTY Left 04/16/2023   Procedure: REVERSE SHOULDER ARTHROPLASTY;  Surgeon: Cammy Copa, MD;  Location: [facility] OR;  Service: Orthopedics;  Laterality: Left;   SHOULDER ARTHROSCOPY WITH ROTATOR CUFF REPAIR AND SUBACROMIAL DECOMPRESSION Right 08/12/2020   Procedure: RIGHT SHOULDER ARTHROSCOPY WITH ROTATOR CUFF REPAIR AND SUBACROMIAL DECOMPRESSION;  Surgeon: Kathryne Hitch, MD;  Location: [facility];  Service: Orthopedics;  Laterality: Right;   TOTAL KNEE ARTHROPLASTY Right 12/24/2020   Procedure: RIGHT TOTAL KNEE ARTHROPLASTY;  Surgeon: Kathryne Hitch, MD;  Location: [facility];  Service: Orthopedics;  Laterality: Right;   TOTAL KNEE ARTHROPLASTY Left 07/15/2021   Procedure: LEFT TOTAL KNEE ARTHROPLASTY;  Surgeon: Kathryne Hitch, MD;  Location: [facility];  Service: Orthopedics;  Laterality: Left;   XI ROBOTIC ASSISTED PARAESOPHAGEAL HERNIA REPAIR N/A 03/12/2023   Procedure: XI ROBOTIC ASSISTED PARAESOPHAGEAL HERNIA REPAIR WITH FUNDOPLICATION;  Surgeon: Berna Bue, MD;  Location: [facility];  Service: General;  Laterality: N/A;  180   Patient  Active Problem List   Diagnosis Date Noted   Arthritis of left shoulder region 04/24/2023   Biceps tendonitis, left 04/24/2023   S/P reverse total shoulder arthroplasty, left 04/16/2023   Depression, recurrent (HCC) 04/03/2023   S/P repair of paraesophageal hernia 03/12/2023   Arthritis of right shoulder region 12/24/2022   Biceps tendonitis on right 12/24/2022   S/P reverse total shoulder arthroplasty, right 12/05/2022   Vertigo 11/27/2022   Iron deficiency anemia 10/30/2022   Physical exam, annual  10/30/2022   Medication management 10/30/2022   Class 2 severe obesity due to excess calories with serious comorbidity in adult, unspecified BMI (HCC) 10/23/2022   Essential hypertension 10/23/2022   Prediabetes 10/23/2022   Hypothyroidism 10/23/2022   Insomnia 06/14/2022   Status post total left knee replacement 07/15/2021   Neoplasm of uncertain behavior of skin 05/25/2021   Generalized osteoarthritis of multiple sites 05/25/2021   Fibromyalgia syndrome 05/25/2021   Restless legs 03/16/2021   Hyperlipidemia 03/16/2021   Anxiety 03/16/2021   Status post right knee replacement 12/24/2020   Unilateral primary osteoarthritis, left knee 11/11/2020   Unilateral primary osteoarthritis, right knee 11/11/2020   Complete tear of right rotator cuff 08/12/2020   Hypertensive disorder 05/05/2019    PCP: Park Meo, FNP   REFERRING PROVIDER: Park Meo, FNP   REFERRING DIAG: [phone] (ICD-10-CM) - History of arthroplasty of left shoulder   THERAPY DIAG:  Acute pain of left shoulder  Muscle weakness (generalized)  Rationale for Evaluation and Treatment: Rehabilitation  ONSET DATE: L reverse TSA on 04/16/23   SUBJECTIVE:                                                                                                                                                                                     SUBJECTIVE STATEMENT:  06/07/2023 Mild soreness after last session but no increase in pain, no pain at present. Reports good HEP adherence. No other new updates   EVAL: I have been using CPM machine and doing light activities in the home like washing cups and not trying to carry anything heavy, L TSA on 10/21/ 24 by Dr August Saucer.  MD as DC sling. I feel like it is a toothache pain last night. I would like to be able to return to yard work and my mud room. I am having to maneuver clothes so I can try to dress myself.  I have been here before with Lelon Mast last time  PERTINENT  HISTORY: allergies, asthma, anemia, fibromyalgia, GERD, HLD, HTN, hypothyroidism, R TSA, bil TKA, basal cell face CA, R RTC before R TSA, bunion on R foot, hernia repair,    PAIN:  Are  you having pain? NPRS scale: 0/10 Pain location: Left shoulder Pain description: Achy Aggravating factors: lying on shoulder initially hurts but then adjusts.  Cannot drive,  I can shower but I assist my left arm with my right, I compensate and tie my bra in the front to dress.  Relieving factors: Resting Toothache pain  PRECAUTIONS: Per 05/28/23 Dr. August Saucer note: "plan at this time is continue with strengthening plus home exercise program"  RED FLAGS: None   WEIGHT BEARING RESTRICTIONS: Yes No weight bearing until released for AROM next visit  FALLS:  Has patient fallen in last 6 months?   YES  slipped on deck steps but no issues other than that time.  LIVING ENVIRONMENT: Lives with: lives with their family and including dtr SIL and grandkids Lives in: House/apartment Stairs: Yes: External: 5 steps; can reach both Has following equipment at home: Single point cane, Walker - 2 wheeled, Wheelchair (manual), Shower bench, and bed side commode  OCCUPATION: Retired in Community education officer   PLOF: Independent  PATIENT GOALS:To use my arm with out limitation   OBJECTIVE:  Note: Objective measures were completed at Evaluation unless otherwise noted. PATIENT SURVEYS:  FOTO 32%  predicted 54% FOTO 05/21/23 50%   POSTURE: Rounded shoulders  Right shoulder more forward than Left  UPPER EXTREMITY ROM:   Active ROM Right eval Left eval Left 05/08/23 Left 05/10/2023 Left 05/16/2023 Left 05/29/23  Shoulder flexion 131/ PROM 145 NT to surgery 04-16-23 PROM 85 100 PROM 120 PROM P: 132 deg painless  Shoulder extension 125/ PROM 142       Shoulder abduction   PROM 60   P: 102 deg mild transient pain  Shoulder adduction        Shoulder internal rotation        Shoulder external rotation   PROM 20 30 PROM     Elbow flexion        Elbow extension        Wrist flexion        Wrist extension        Wrist ulnar deviation        Wrist radial deviation        Wrist pronation        Wrist supination        (Blank rows = not tested)  UPPER EXTREMITY MMT:not formally assess at eval d/t ROM deficits and post-op status   MMT Right eval Left eval  Shoulder flexion  NT due to recent TSA surgery < 2 weeks  Shoulder extension    Shoulder abduction    Shoulder adduction    Shoulder internal rotation    Shoulder external rotation    Middle trapezius    Lower trapezius    Elbow flexion    Elbow extension    Wrist flexion    Wrist extension    Wrist ulnar deviation    Wrist radial deviation    Wrist pronation    Wrist supination    Grip strength (lbs)    (Blank rows = not tested)  SHOULDER SPECIAL TESTS: NT due to L TSA reverse  JOINT MOBILITY TESTING:  NT  PALPATION:  Pt with steri strips on surgical site well intact    TODAY'S TREATMENT [facility] Adult PT Treatment:  DATE: 06/07/23 Therapeutic Exercise: Supine punch x15  Supine GH flexion AROM x8 to pt tolerance Sidelying L GH abduction AROM 2x8 cues for setup and appropriate positioning/ROM Sidelying L GH ER to neutral 2x8 Seated passive shoulder flexion slide x10 L GH Seated passive shoulder scaption slide x10 L GH Seated passive shoulder abduction x10 L GH  UE ranger standing L GH flexion x10 HEP update + education/handout  Manual Therapy: Supine PROM to pt tolerance abd/flex/scaption/ER   [facility] Adult PT Treatment:                                                DATE: 06/05/23 Therapeutic Exercise: Supine punch x8 (elbow supported to prevent extension at resting position) Shoulder flexion AROM x8 supine (elbow supported to prevent extension at resting position) Finger ladder flexion AAROM x6 Finger ladder scaption AAROM x5 Standing shoulder flexion at table w/ swiss ball  x10 Standing shoulder scaption AAROM at table w/ swiss ball x8  Manual Therapy: Supine; PROM to pt tolerance for flex/abduction, ER to ~30deg without pain    [facility] Adult PT Treatment:                                                DATE: 05/31/23 Therapeutic Exercise: Supine punches x12 Supine flexion AROM x8 cues for comfortable ROM Finger ladder flexion AAROM x8 (PR 19) cues to minimize shrug UE ranger flexion AAROM x6 cues for comfortable ROM and minimizing shrug Passive shoulder flexion slide at table x15 Passive shoulder scaption slide at table x15 Wall slides x5 cues for pacing  HEP update + education    PATIENT EDUCATION: Education details: rationale for interventions, HEP  Person educated: Patient Education method: Explanation, Demonstration, Tactile cues, Verbal cues Education comprehension: verbalized understanding, returned demonstration, verbal cues required, tactile cues required, and needs further education     HOME EXERCISE PROGRAM: Access Code: [email or web address]   ASSESSMENT: CLINICAL IMPRESSION: 06/07/2023 Pt arrives w/ no pain, no issues after last session. Today continuing to progress well with L GH AROM/AAROM activities. No increases in pain, remains limited primarily by stiffness and muscular fatigue. Progressions for AROM into abduction and ER to neutral today with good tolerance, no increase in pain. No adverse events, pt reports no pain at end of session. Recommend continuing along current POC in order to address relevant deficits and improve functional tolerance. Pt departs today's session in no acute distress, all voiced questions/concerns addressed appropriately from PT perspective.      EVAL: Patient is a 62 y.o. female who was seen today for physical therapy evaluation and treatment for 2 weeks s/p reverse total shoulder arthroplasty. L TSA on 04/16/23 by Dr Rise Paganini. Pt demonstrates decreased Left shoulder AROM, strength and also some decreased AROM of R  shoulder from previous surgery. She does not complain of pain on entering clinic but does have discomfort and  pain and difficulty with ADLs/IADLs, driving, sleep quality, and participation in normal daily activities especially yard work which she desires ot return.. She requires skilled PT services at this time to address all relevant deficits and return to PLOF   OBJECTIVE IMPAIRMENTS: decreased activity tolerance, decreased endurance, decreased ROM, decreased strength, impaired UE functional use, postural dysfunction, and pain.  ACTIVITY LIMITATIONS: carrying, lifting, sleeping, bathing, dressing, reach over head, and hygiene/grooming  PARTICIPATION LIMITATIONS: meal prep, cleaning, laundry, driving, shopping, community activity, and yard work  PERSONAL FACTORS: allergies, asthma, anemia, fibromyalgia, GERD, HLD, HTN, hypothyroidism, R TSA, bil TKA, basal cell face CA, R RTC before R TSA, bunion on R foot, hernia repair are also affecting patient's functional outcome.    GOALS: Goals reviewed with patient? Yes  SHORT TERM GOALS: Target date: 05-31-23  Pt will be independent with initial HEP Baseline: 05/31/23: pt reports good performance of HEP overall Goal status: MET  2.  Pt pain level will not be greater than 4/10 with active motion Baseline: Pt post surgery 2 weeks, only isometric ex 05/31/23: no pain with movement, just heaviness Goal status: MET  3.  Pt will achieve 90 degrees flexion  AROM Baseline: NT due to post surgery eval 05/31/23: able to achieve >90 deg gravity eliminated AROM Goal status: PARTIALLY MET  LONG TERM GOALS: Target date: 06-28-23  Pt will be independent with advanced HEP Baseline: needs reinforcement has had R TSR reverse Goal status: INITIAL  2.  FOTO will improve from 32%  to   54% indicating improved functional mobility.  Baseline: eval 32% Goal status: INITIAL  3.  Left shoulder IR and ER will return to [facility] to return to 2/10 or less NPS for as  dressing and grooming Baseline: Pt unable to dress without compensation and limited motion Goal status: INITIAL  4.  Pt will be able to sleep on Left side without awaking due to pain Baseline: Pt post surgery 04-16-23 avoided lying on left side but now initial discomfort with lying on left side  Goal status: INITIAL  5.  Left  shoulder AROM scaption will improve to minimally 0-140 degrees for improved overhead reaching Baseline: SEE AROM chart Goal status: INITIAL  6.  Patient will demonstrate ability to push/pull at least 40# with <3/10 pain in order to return to heavy household and yardwork tasks.  Baseline: post op 04-16-23, only light weight such as coffee cup Goal status: INITIAL   PLAN: PT FREQUENCY: 2x/week  PT DURATION: 8 weeks  PLANNED INTERVENTIONS: 97164- PT Re-evaluation, 97110-Therapeutic exercises, 97530- Therapeutic activity, 97112- Neuromuscular re-education, 97535- Self Care, 16109- Manual therapy, 97014- Electrical stimulation (unattended), [id number]- Electrical stimulation (manual), Patient/Family education, Taping, Dry Needling, Joint mobilization, Cryotherapy, and Moist heat  PLAN FOR NEXT SESSION: continue with isometrics, AAROM/AROM in standing positions. Avoiding IR/ext, no ER past 30 deg     Ashley Murrain PT, DPT 06/07/2023 2:45 PM

## 2023-06-07 ENCOUNTER — Ambulatory Visit: Payer: Medicaid Other | Admitting: Physical Therapy

## 2023-06-07 ENCOUNTER — Encounter: Payer: Self-pay | Admitting: Physical Therapy

## 2023-06-07 DIAGNOSIS — M6281 Muscle weakness (generalized): Secondary | ICD-10-CM

## 2023-06-07 DIAGNOSIS — M25512 Pain in left shoulder: Secondary | ICD-10-CM

## 2023-06-08 ENCOUNTER — Other Ambulatory Visit: Payer: Self-pay | Admitting: Family Medicine

## 2023-06-08 DIAGNOSIS — Z760 Encounter for issue of repeat prescription: Secondary | ICD-10-CM

## 2023-06-08 DIAGNOSIS — I10 Essential (primary) hypertension: Secondary | ICD-10-CM

## 2023-06-11 NOTE — Therapy (Signed)
OUTPATIENT PHYSICAL THERAPY SHOULDER TREATMENT   Patient Name: Denise Morales MRN: [id number] DOB:[birth year], 62 y.o., female Today's Date: 06/12/2023   END OF SESSION:  PT End of Session - 06/12/23 1100     Visit Number 12    Number of Visits 17    Date for PT Re-Evaluation 06/28/23    Authorization Type [employer]    Authorization Time Period 05/07/23 - 08/05/23    Authorization - Visit Number 11    Authorization - Number of Visits 15    PT Start Time 1100    PT Stop Time 1139    PT Time Calculation (min) 39 min    Activity Tolerance Patient tolerated treatment well                       Past Medical History:  Diagnosis Date   Allergy    Anemia    Anxiety    Arthritis    Depression    Fibromyalgia    GERD (gastroesophageal reflux disease)    Heart murmur    History of hiatal hernia    Hyperlipidemia    Hypertension    Hypothyroidism    Melanoma (HCC)    face   Past Surgical History:  Procedure Laterality Date   ABDOMINAL HYSTERECTOMY  1985   AIKEN OSTEOTOMY Right 06/05/2022   Procedure: Quintella Reichert OSTEOTOMY;  Surgeon: Candelaria Stagers, DPM;  Location: [facility];  Service: Podiatry;  Laterality: Right;   BICEPT TENODESIS Right 12/05/2022   Procedure: RIGHT BICEPS TENODESIS;  Surgeon: Cammy Copa, MD;  Location: [facility] OR;  Service: Orthopedics;  Laterality: Right;   CAPSULOTOMY METATARSOPHALANGEAL Right 06/05/2022   Procedure: CAPSULOTOMY METATARSOPHALANGEAL;  Surgeon: Candelaria Stagers, DPM;  Location: [facility] [place];  Service: Podiatry;  Laterality: Right;   CHOLECYSTECTOMY     COLONOSCOPY     HALLUX VALGUS LAPIDUS Right 06/05/2022   Procedure: HALLUX VALGUS LAPIDUS;  Surgeon: Candelaria Stagers, DPM;  Location: [facility];  Service: Podiatry;  Laterality: Right;   HAMMER TOE SURGERY Right 06/05/2022   Procedure: HAMMER TOE CORRECTION;  Surgeon: Candelaria Stagers, DPM;  Location: [facility] ;   Service: Podiatry;  Laterality: Right;   REVERSE SHOULDER ARTHROPLASTY Right 12/05/2022   Procedure: RIGHT REVERSE SHOULDER ARTHROPLASTY;  Surgeon: Cammy Copa, MD;  Location: [facility] OR;  Service: Orthopedics;  Laterality: Right;   REVERSE SHOULDER ARTHROPLASTY Left 04/16/2023   Procedure: REVERSE SHOULDER ARTHROPLASTY;  Surgeon: Cammy Copa, MD;  Location: [facility] OR;  Service: Orthopedics;  Laterality: Left;   SHOULDER ARTHROSCOPY WITH ROTATOR CUFF REPAIR AND SUBACROMIAL DECOMPRESSION Right 08/12/2020   Procedure: RIGHT SHOULDER ARTHROSCOPY WITH ROTATOR CUFF REPAIR AND SUBACROMIAL DECOMPRESSION;  Surgeon: Kathryne Hitch, MD;  Location: [facility];  Service: Orthopedics;  Laterality: Right;   TOTAL KNEE ARTHROPLASTY Right 12/24/2020   Procedure: RIGHT TOTAL KNEE ARTHROPLASTY;  Surgeon: Kathryne Hitch, MD;  Location: [facility];  Service: Orthopedics;  Laterality: Right;   TOTAL KNEE ARTHROPLASTY Left 07/15/2021   Procedure: LEFT TOTAL KNEE ARTHROPLASTY;  Surgeon: Kathryne Hitch, MD;  Location: [facility];  Service: Orthopedics;  Laterality: Left;   XI ROBOTIC ASSISTED PARAESOPHAGEAL HERNIA REPAIR N/A 03/12/2023   Procedure: XI ROBOTIC ASSISTED PARAESOPHAGEAL HERNIA REPAIR WITH FUNDOPLICATION;  Surgeon: Berna Bue, MD;  Location: [facility];  Service: General;  Laterality: N/A;  180   Patient Active Problem List   Diagnosis Date Noted  Arthritis of left shoulder region 04/24/2023   Biceps tendonitis, left 04/24/2023   S/P reverse total shoulder arthroplasty, left 04/16/2023   Depression, recurrent (HCC) 04/03/2023   S/P repair of paraesophageal hernia 03/12/2023   Arthritis of right shoulder region 12/24/2022   Biceps tendonitis on right 12/24/2022   S/P reverse total shoulder arthroplasty, right 12/05/2022   Vertigo 11/27/2022   Iron deficiency anemia 10/30/2022   Physical exam, annual 10/30/2022   Medication management 10/30/2022   Class 2  severe obesity due to excess calories with serious comorbidity in adult, unspecified BMI (HCC) 10/23/2022   Essential hypertension 10/23/2022   Prediabetes 10/23/2022   Hypothyroidism 10/23/2022   Insomnia 06/14/2022   Status post total left knee replacement 07/15/2021   Neoplasm of uncertain behavior of skin 05/25/2021   Generalized osteoarthritis of multiple sites 05/25/2021   Fibromyalgia syndrome 05/25/2021   Restless legs 03/16/2021   Hyperlipidemia 03/16/2021   Anxiety 03/16/2021   Status post right knee replacement 12/24/2020   Unilateral primary osteoarthritis, left knee 11/11/2020   Unilateral primary osteoarthritis, right knee 11/11/2020   Complete tear of right rotator cuff 08/12/2020   Hypertensive disorder 05/05/2019    PCP: Park Meo, FNP   REFERRING PROVIDER: Park Meo, FNP   REFERRING DIAG: [phone] (ICD-10-CM) - History of arthroplasty of left shoulder   THERAPY DIAG:  Acute pain of left shoulder  Muscle weakness (generalized)  Rationale for Evaluation and Treatment: Rehabilitation  ONSET DATE: L reverse TSA on 04/16/23   SUBJECTIVE:                                                                                                                                                                                     SUBJECTIVE STATEMENT:  06/12/2023 Pt describes some soreness today, denies overt pain. Felt good after last session, HEP going well. No new updates.   EVAL: I have been using CPM machine and doing light activities in the home like washing cups and not trying to carry anything heavy, L TSA on 10/21/ 24 by Dr August Saucer.  MD as DC sling. I feel like it is a toothache pain last night. I would like to be able to return to yard work and my mud room. I am having to maneuver clothes so I can try to dress myself.  I have been here before with Lelon Mast last time  PERTINENT HISTORY: allergies, asthma, anemia, fibromyalgia, GERD, HLD, HTN, hypothyroidism, R  TSA, bil TKA, basal cell face CA, R RTC before R TSA, bunion on R foot, hernia repair,    PAIN:  Are you having pain? NPRS scale: 0/10 Pain location: Left shoulder Pain description: Achy  Aggravating factors: lying on shoulder initially hurts but then adjusts.  Cannot drive,  I can shower but I assist my left arm with my right, I compensate and tie my bra in the front to dress.  Relieving factors: Resting Toothache pain  PRECAUTIONS: Per 05/28/23 Dr. August Saucer note: "plan at this time is continue with strengthening plus home exercise program"  RED FLAGS: None   WEIGHT BEARING RESTRICTIONS: Yes No weight bearing until released for AROM next visit  FALLS:  Has patient fallen in last 6 months?   YES  slipped on deck steps but no issues other than that time.  LIVING ENVIRONMENT: Lives with: lives with their family and including dtr SIL and grandkids Lives in: House/apartment Stairs: Yes: External: 5 steps; can reach both Has following equipment at home: Single point cane, Walker - 2 wheeled, Wheelchair (manual), Shower bench, and bed side commode  OCCUPATION: Retired in Community education officer   PLOF: Independent  PATIENT GOALS:To use my arm with out limitation   OBJECTIVE:  Note: Objective measures were completed at Evaluation unless otherwise noted. PATIENT SURVEYS:  FOTO 32%  predicted 54% FOTO 05/21/23 50%   POSTURE: Rounded shoulders  Right shoulder more forward than Left  UPPER EXTREMITY ROM:   Active ROM Right eval Left eval Left 05/08/23 Left 05/10/2023 Left 05/16/2023 Left 05/29/23 Left 06/12/23  Shoulder flexion 131/ PROM 145 NT to surgery 04-16-23 PROM 85 100 PROM 120 PROM P: 132 deg painless A: 106 deg (standing)  A: 142 deg (supine)  Shoulder extension 125/ PROM 142        Shoulder abduction   PROM 60   P: 102 deg mild transient pain   Shoulder adduction         Shoulder internal rotation         Shoulder external rotation   PROM 20 30 PROM     Elbow flexion          Elbow extension         Wrist flexion         Wrist extension         Wrist ulnar deviation         Wrist radial deviation         Wrist pronation         Wrist supination         (Blank rows = not tested)  UPPER EXTREMITY MMT:not formally assess at eval d/t ROM deficits and post-op status   MMT Right eval Left eval  Shoulder flexion  NT due to recent TSA surgery < 2 weeks  Shoulder extension    Shoulder abduction    Shoulder adduction    Shoulder internal rotation    Shoulder external rotation    Middle trapezius    Lower trapezius    Elbow flexion    Elbow extension    Wrist flexion    Wrist extension    Wrist ulnar deviation    Wrist radial deviation    Wrist pronation    Wrist supination    Grip strength (lbs)    (Blank rows = not tested)  SHOULDER SPECIAL TESTS: NT due to L TSA reverse  JOINT MOBILITY TESTING:  NT  PALPATION:  Pt with steri strips on surgical site well intact    TODAY'S TREATMENT [facility] Adult PT Treatment:  DATE: 06/12/23 Therapeutic Exercise: Standing UE ranger flexion AAROM x8  Standing UE ranger scaption AAROM x8 Shoulder flexion isometric x8 Shoulder abduction isometric x8 cues for setup/alignment  Shoulder abduction AROM sidelying (<90 deg) 2x5 cues for positioning and comfortable ROM  Manual Therapy: PROM L GH flex/abd/ER within pt tolerance    [facility] Adult PT Treatment:                                                DATE: 06/07/23 Therapeutic Exercise: Supine punch x15  Supine GH flexion AROM x8 to pt tolerance Sidelying L GH abduction AROM 2x8 cues for setup and appropriate positioning/ROM Sidelying L GH ER to neutral 2x8 Seated passive shoulder flexion slide x10 L GH Seated passive shoulder scaption slide x10 L GH Seated passive shoulder abduction x10 L GH  UE ranger standing L GH flexion x10 HEP update + education/handout  Manual Therapy: Supine PROM to pt tolerance  abd/flex/scaption/ER   [facility] Adult PT Treatment:                                                DATE: 06/05/23 Therapeutic Exercise: Supine punch x8 (elbow supported to prevent extension at resting position) Shoulder flexion AROM x8 supine (elbow supported to prevent extension at resting position) Finger ladder flexion AAROM x6 Finger ladder scaption AAROM x5 Standing shoulder flexion at table w/ swiss ball x10 Standing shoulder scaption AAROM at table w/ swiss ball x8  Manual Therapy: Supine; PROM to pt tolerance for flex/abduction, ER to ~30deg without pain    PATIENT EDUCATION: Education details: rationale for interventions, HEP  Person educated: Patient Education method: Explanation, Demonstration, Tactile cues, Verbal cues Education comprehension: verbalized understanding, returned demonstration, verbal cues required, tactile cues required, and needs further education     HOME EXERCISE PROGRAM: Access Code: [email or web address]   ASSESSMENT: CLINICAL IMPRESSION: 06/12/2023 Pt arrives w/o pain although does endorse some soreness in lateral/posterior shoulder, no issues after last session. Continuing to progress AROM/AAROM activities within pt tolerance, working with isometrics as well which do increase muscular fatigue. No adverse events or increase in resting pain. ROM measurements are improving, see above. Continues to progress well overall, limited primarily by reduced strength at this point. Recommend continuing along current POC in order to address relevant deficits and improve functional tolerance. Pt departs today's session in no acute distress, all voiced questions/concerns addressed appropriately from PT perspective.    EVAL: Patient is a 62 y.o. female who was seen today for physical therapy evaluation and treatment for 2 weeks s/p reverse total shoulder arthroplasty. L TSA on 04/16/23 by Dr Rise Paganini. Pt demonstrates decreased Left shoulder AROM, strength and also some decreased  AROM of R shoulder from previous surgery. She does not complain of pain on entering clinic but does have discomfort and  pain and difficulty with ADLs/IADLs, driving, sleep quality, and participation in normal daily activities especially yard work which she desires ot return.. She requires skilled PT services at this time to address all relevant deficits and return to PLOF   OBJECTIVE IMPAIRMENTS: decreased activity tolerance, decreased endurance, decreased ROM, decreased strength, impaired UE functional use, postural dysfunction, and pain.   ACTIVITY LIMITATIONS: carrying, lifting, sleeping, bathing, dressing, reach over  head, and hygiene/grooming  PARTICIPATION LIMITATIONS: meal prep, cleaning, laundry, driving, shopping, community activity, and yard work  PERSONAL FACTORS: allergies, asthma, anemia, fibromyalgia, GERD, HLD, HTN, hypothyroidism, R TSA, bil TKA, basal cell face CA, R RTC before R TSA, bunion on R foot, hernia repair are also affecting patient's functional outcome.    GOALS: Goals reviewed with patient? Yes  SHORT TERM GOALS: Target date: 05-31-23  Pt will be independent with initial HEP Baseline: 05/31/23: pt reports good performance of HEP overall Goal status: MET  2.  Pt pain level will not be greater than 4/10 with active motion Baseline: Pt post surgery 2 weeks, only isometric ex 05/31/23: no pain with movement, just heaviness Goal status: MET  3.  Pt will achieve 90 degrees flexion  AROM Baseline: NT due to post surgery eval 05/31/23: able to achieve >90 deg gravity eliminated AROM Goal status: PARTIALLY MET  LONG TERM GOALS: Target date: 06-28-23  Pt will be independent with advanced HEP Baseline: needs reinforcement has had R TSR reverse Goal status: INITIAL  2.  FOTO will improve from 32%  to   54% indicating improved functional mobility.  Baseline: eval 32% Goal status: INITIAL  3.  Left shoulder IR and ER will return to [facility] to return to 2/10 or less NPS  for as dressing and grooming Baseline: Pt unable to dress without compensation and limited motion Goal status: INITIAL  4.  Pt will be able to sleep on Left side without awaking due to pain Baseline: Pt post surgery 04-16-23 avoided lying on left side but now initial discomfort with lying on left side  Goal status: INITIAL  5.  Left  shoulder AROM scaption will improve to minimally 0-140 degrees for improved overhead reaching Baseline: SEE AROM chart Goal status: INITIAL  6.  Patient will demonstrate ability to push/pull at least 40# with <3/10 pain in order to return to heavy household and yardwork tasks.  Baseline: post op 04-16-23, only light weight such as coffee cup Goal status: INITIAL   PLAN: PT FREQUENCY: 2x/week  PT DURATION: 8 weeks  PLANNED INTERVENTIONS: 97164- PT Re-evaluation, 97110-Therapeutic exercises, 97530- Therapeutic activity, 97112- Neuromuscular re-education, 97535- Self Care, [id number]- Manual therapy, 97014- Electrical stimulation (unattended), [id number]- Electrical stimulation (manual), Patient/Family education, Taping, Dry Needling, Joint mobilization, Cryotherapy, and Moist heat  PLAN FOR NEXT SESSION: continue with isometrics, AAROM/AROM in standing positions. Avoiding IR/ext, no ER past 30 deg     Ashley Murrain PT, DPT 06/12/2023 11:45 AM

## 2023-06-11 NOTE — Progress Notes (Signed)
Comprehensive Clinical Assessment (CCA) Note  06/04/2023 Denise Morales [id number]  Chief Complaint:  Chief Complaint  Patient presents with   Depression   Visit Diagnosis:  Major depressive disorder, recurrent episode, moderate Brief   Interpretive Summary: Client is a 62 year old female presenting to the [facility] to establish outpatient care. Client reported she is referred by her primary care physician outside of the [facility] health network. Client reported she was referred due to ongoing symptoms of depression and grief. Client reported in July 09, 2009 her father passed and her sister health who was helping with him health declined. Client reported that eldest sister passed 6 months later. Client reported she was looking after her mother in and out her home she was somewhat independent. Client reported in July 09, 2018 her mother health declined from frequent strokes. Client reported in Jul 10, 2019 her mother passed away. Client reported following her mother passing she came to that her health had been put on the back burner. Client reported she had a spot on her face she couldn't clear up due to no insurance but found out it was melanoma and had it removed on her birthday. Client reported her next to oldest sister went to the hospital due to falling and being in pain and she passed away in 2019/07/10 as well. client reported following that time she had some health issues to work through. Client reported she wants to become herself again.  Client reported she is currently managed on Lexapro but is not sure if activities.  Client reported she still is working through lack of motivation, fatigue and depressed mood. Client presented oriented x 5, appropriately dressed, and friendly.  Client denied hallucinations and delusions.  Client was screened for pain, nutrition, [place] suicide severity and the following S DOH:    10/23/2022    3:17 PM 12/01/2020    2:49 PM 08/23/2020   10:31 AM 03/30/2020    10:55 AM  GAD 7 : Generalized Anxiety Score  Nervous, Anxious, on Edge 1 0 0 1  Control/stop worrying 0 0 0 2  Worry too much - different things 0 0 0 2  Trouble relaxing 1 0 0 2  Restless 1 0 0 2  Easily annoyed or irritable 0 0 0 1  Afraid - awful might happen 0 0 0 0  Total GAD 7 Score 3 0 0 10  Anxiety Difficulty  Not difficult at all Not difficult at all Somewhat difficult    Treatment recommendations: individual counseling and psychiatry  Therapist provided information on format of appointment (virtual or face to face).   The client was advised to call back or seek an in-person evaluation if the symptoms worsen or if the condition fails to improve as anticipated before the next scheduled appointment. Client was in agreement with treatment recommendations.   CCA Biopsychosocial Intake/Chief Complaint:  client reported she is referred by [facility]. client reported her PCP felt she needed a counselor to talk to about all the trauma she has been through. client reported ahistory of grief and lingering depression.  Current Symptoms/Problems: No data recorded  Patient Reported Schizophrenia/Schizoaffective Diagnosis in Past: No data recorded  Strengths: No data recorded Preferences: No data recorded Abilities: No data recorded  Type of Services Patient Feels are Needed: No data recorded  Initial Clinical Notes/Concerns: No data recorded  Mental Health Symptoms Depression:  Change in energy/activity; Tearfulness; Difficulty Concentrating   Duration of Depressive symptoms: Greater than two weeks   Mania:  None  Anxiety:   None   Psychosis:  None   Duration of Psychotic symptoms: No data recorded  Trauma:  None   Obsessions:  None   Compulsions:  None   Inattention:  None   Hyperactivity/Impulsivity:  None   Oppositional/Defiant Behaviors:  None   Emotional Irregularity:  None   Other Mood/Personality Symptoms:  No data recorded   Mental  Status Exam Appearance and self-care  Stature:  Average   Weight:  Average weight   Clothing:  Casual   Grooming:  Normal   Cosmetic use:  Age appropriate   Posture/gait:  Normal   Motor activity:  Not Remarkable   Sensorium  Attention:  Normal   Concentration:  Normal   Orientation:  X5   Recall/memory:  Normal   Affect and Mood  Affect:  Depressed   Mood:  Depressed   Relating  Eye contact:  Normal   Facial expression:  Responsive   Attitude toward examiner:  Cooperative   Thought and Language  Speech flow: Clear and Coherent   Thought content:  Appropriate to Mood and Circumstances   Preoccupation:  None   Hallucinations:  None   Organization:  No data recorded  [employer] of Knowledge:  Good   Intelligence:  Average   Abstraction:  Normal   Judgement:  Good   Reality Testing:  Adequate   Insight:  Good   Decision Making:  Normal   Social Functioning  Social Maturity:  Responsible   Social Judgement:  Normal   Stress  Stressors:  Grief/losses; Transitions   Coping Ability:  Normal   Skill Deficits:  Activities of daily living   Supports:  Friends/Service system; Family     Religion: Religion/Spirituality Are You A Religious Person?: Yes  Leisure/Recreation: Leisure / Recreation Do You Have Hobbies?: No  Exercise/Diet: Exercise/Diet Do You Exercise?: No Have You Gained or Lost A Significant Amount of Weight in the Past Six Months?: No Do You Follow a Special Diet?: No Do You Have Any Trouble Sleeping?: Yes   CCA Employment/Education Employment/Work Situation: Employment / Work Situation Employment Situation: Retired 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  Childhood History:  Childhood History By whom was/is the patient raised?: Both parents Additional childhood history information: client reported she was raised by both parents in [place]. client reported she had a good childhood. Does patient have siblings?: Yes Did patient suffer any verbal/emotional/physical/sexual abuse as a child?: No Did patient suffer from severe childhood neglect?: No Has patient ever been sexually abused/assaulted/raped as an adolescent or adult?: No Was the patient ever a victim of a crime or a disaster?: No Witnessed domestic violence?: No Has patient been affected by domestic violence as an adult?: No  Child/Adolescent Assessment:     CCA Substance Use Alcohol/Drug Use: Alcohol / Drug Use History of alcohol / drug use?: No history of alcohol / drug abuse                         ASAM's:  Six Dimensions of Multidimensional Assessment  Dimension 1:  Acute Intoxication and/or Withdrawal Potential:      Dimension 2:  Biomedical Conditions and Complications:      Dimension 3:  Emotional, Behavioral, or Cognitive Conditions and Complications:     Dimension 4:  Readiness to Change:     Dimension 5:  Relapse, Continued use, or Continued Problem Potential:     Dimension 6:  Recovery/Living Environment:     ASAM Severity Score:    ASAM Recommended Level of Treatment:     Substance use Disorder (SUD)    Recommendations for Services/Supports/Treatments: Recommendations for Services/Supports/Treatments Recommendations For Services/Supports/Treatments: Individual Therapy  DSM5 Diagnoses: Patient Active Problem List   Diagnosis Date Noted   Arthritis of left shoulder region 04/24/2023   Biceps tendonitis, left 04/24/2023    S/P reverse total shoulder arthroplasty, left 04/16/2023   Depression, recurrent (HCC) 04/03/2023   S/P repair of paraesophageal hernia 03/12/2023   Arthritis of right shoulder region 12/24/2022   Biceps tendonitis on right 12/24/2022   S/P reverse total shoulder arthroplasty, right 12/05/2022   Vertigo 11/27/2022   Iron deficiency anemia 10/30/2022   Physical exam, annual 10/30/2022   Medication management 10/30/2022   Class 2 severe obesity due to excess calories with serious comorbidity in adult, unspecified BMI (HCC) 10/23/2022   Essential hypertension 10/23/2022   Prediabetes 10/23/2022   Hypothyroidism 10/23/2022   Insomnia 06/14/2022   Status post total left knee replacement 07/15/2021   Neoplasm of uncertain behavior of skin 05/25/2021   Generalized osteoarthritis of multiple sites 05/25/2021   Fibromyalgia syndrome 05/25/2021   Restless legs 03/16/2021   Hyperlipidemia 03/16/2021   Anxiety 03/16/2021   Status post right knee replacement 12/24/2020   Unilateral primary osteoarthritis, left knee 11/11/2020   Unilateral primary osteoarthritis, right knee 11/11/2020   Complete tear of right rotator cuff 08/12/2020   Hypertensive disorder 05/05/2019    Patient Centered Plan: Patient is on the following Treatment Plan(s):  Depression   Referrals to Alternative Service(s): Referred to Alternative Service(s):   Place:   Date:   Time:    Referred to Alternative Service(s):   Place:   Date:   Time:    Referred to Alternative Service(s):   Place:   Date:   Time:    Referred to Alternative Service(s):   Place:   Date:   Time:      Collaboration of Care: Referral or follow-up with counselor/therapist [facility]  Patient/Guardian was advised Release of Information must be obtained prior to any record release in order to collaborate their care with an outside provider. Patient/Guardian was advised if they have not already done so to contact the registration department to sign all  necessary forms in order for [place] to release information regarding their care.   Consent: Patient/Guardian gives verbal consent for treatment and assignment of benefits for services provided during this visit. Patient/Guardian expressed understanding and agreed to proceed.   Neena Rhymes Adrena Nakamura, LCSW

## 2023-06-12 ENCOUNTER — Encounter: Payer: Self-pay | Admitting: Physical Therapy

## 2023-06-12 ENCOUNTER — Ambulatory Visit: Payer: Medicaid Other | Admitting: Physical Therapy

## 2023-06-12 DIAGNOSIS — M25512 Pain in left shoulder: Secondary | ICD-10-CM

## 2023-06-12 DIAGNOSIS — M6281 Muscle weakness (generalized): Secondary | ICD-10-CM

## 2023-06-13 NOTE — Therapy (Signed)
OUTPATIENT PHYSICAL THERAPY SHOULDER TREATMENT   Patient Name: Denise Morales MRN: [id number] DOB:05/21/1961, 62 y.o., female Today's Date: 06/14/2023   END OF SESSION:  PT End of Session - 06/14/23 1144     Visit Number 13    Number of Visits 17    Date for PT Re-Evaluation 06/28/23    Authorization Type [employer]    Authorization Time Period 05/07/23 - 08/05/23    Authorization - Visit Number 12    Authorization - Number of Visits 15    PT Start Time 1145    PT Stop Time 1229    PT Time Calculation (min) 44 min    Activity Tolerance Patient tolerated treatment well                        Past Medical History:  Diagnosis Date   Allergy    Anemia    Anxiety    Arthritis    Depression    Fibromyalgia    GERD (gastroesophageal reflux disease)    Heart murmur    History of hiatal hernia    Hyperlipidemia    Hypertension    Hypothyroidism    Melanoma (HCC)    face   Past Surgical History:  Procedure Laterality Date   ABDOMINAL HYSTERECTOMY  1985   AIKEN OSTEOTOMY Right 06/05/2022   Procedure: Quintella Reichert OSTEOTOMY;  Surgeon: Candelaria Stagers, DPM;  Location: [facility];  Service: Podiatry;  Laterality: Right;   BICEPT TENODESIS Right 12/05/2022   Procedure: RIGHT BICEPS TENODESIS;  Surgeon: Cammy Copa, MD;  Location: [facility] OR;  Service: Orthopedics;  Laterality: Right;   CAPSULOTOMY METATARSOPHALANGEAL Right 06/05/2022   Procedure: CAPSULOTOMY METATARSOPHALANGEAL;  Surgeon: Candelaria Stagers, DPM;  Location: [facility];  Service: Podiatry;  Laterality: Right;   CHOLECYSTECTOMY     COLONOSCOPY     HALLUX VALGUS LAPIDUS Right 06/05/2022   Procedure: HALLUX VALGUS LAPIDUS;  Surgeon: Candelaria Stagers, DPM;  Location: [facility];  Service: Podiatry;  Laterality: Right;   HAMMER TOE SURGERY Right 06/05/2022   Procedure: HAMMER TOE CORRECTION;  Surgeon: Candelaria Stagers, DPM;  Location: [facility];  Service: Podiatry;  Laterality: Right;   REVERSE SHOULDER ARTHROPLASTY Right 12/05/2022   Procedure: RIGHT REVERSE SHOULDER ARTHROPLASTY;  Surgeon: Cammy Copa, MD;  Location: [facility] OR;  Service: Orthopedics;  Laterality: Right;   REVERSE SHOULDER ARTHROPLASTY Left 04/16/2023   Procedure: REVERSE SHOULDER ARTHROPLASTY;  Surgeon: Cammy Copa, MD;  Location: [facility] OR;  Service: Orthopedics;  Laterality: Left;   SHOULDER ARTHROSCOPY WITH ROTATOR CUFF REPAIR AND SUBACROMIAL DECOMPRESSION Right 08/12/2020   Procedure: RIGHT SHOULDER ARTHROSCOPY WITH ROTATOR CUFF REPAIR AND SUBACROMIAL DECOMPRESSION;  Surgeon: Kathryne Hitch, MD;  Location: [facility];  Service: Orthopedics;  Laterality: Right;   TOTAL KNEE ARTHROPLASTY Right 12/24/2020   Procedure: RIGHT TOTAL KNEE ARTHROPLASTY;  Surgeon: Kathryne Hitch, MD;  Location: [facility];  Service: Orthopedics;  Laterality: Right;   TOTAL KNEE ARTHROPLASTY Left 07/15/2021   Procedure: LEFT TOTAL KNEE ARTHROPLASTY;  Surgeon: Kathryne Hitch, MD;  Location: [facility];  Service: Orthopedics;  Laterality: Left;   XI ROBOTIC ASSISTED PARAESOPHAGEAL HERNIA REPAIR N/A 03/12/2023   Procedure: XI ROBOTIC ASSISTED PARAESOPHAGEAL HERNIA REPAIR WITH FUNDOPLICATION;  Surgeon: Berna Bue, MD;  Location: [facility];  Service: General;  Laterality: N/A;  180   Patient Active Problem List   Diagnosis Date Noted  Arthritis of left shoulder region 04/24/2023   Biceps tendonitis, left 04/24/2023   S/P reverse total shoulder arthroplasty, left 04/16/2023   Depression, recurrent (HCC) 04/03/2023   S/P repair of paraesophageal hernia 03/12/2023   Arthritis of right shoulder region 12/24/2022   Biceps tendonitis on right 12/24/2022   S/P reverse total shoulder arthroplasty, right 12/05/2022   Vertigo 11/27/2022   Iron deficiency anemia 10/30/2022   Physical exam, annual 10/30/2022   Medication management 10/30/2022    Class 2 severe obesity due to excess calories with serious comorbidity in adult, unspecified BMI (HCC) 10/23/2022   Essential hypertension 10/23/2022   Prediabetes 10/23/2022   Hypothyroidism 10/23/2022   Insomnia 06/14/2022   Status post total left knee replacement 07/15/2021   Neoplasm of uncertain behavior of skin 05/25/2021   Generalized osteoarthritis of multiple sites 05/25/2021   Fibromyalgia syndrome 05/25/2021   Restless legs 03/16/2021   Hyperlipidemia 03/16/2021   Anxiety 03/16/2021   Status post right knee replacement 12/24/2020   Unilateral primary osteoarthritis, left knee 11/11/2020   Unilateral primary osteoarthritis, right knee 11/11/2020   Complete tear of right rotator cuff 08/12/2020   Hypertensive disorder 05/05/2019    PCP: Park Meo, FNP   REFERRING PROVIDER: Park Meo, FNP   REFERRING DIAG: [phone] (ICD-10-CM) - History of arthroplasty of left shoulder   THERAPY DIAG:  Acute pain of left shoulder  Muscle weakness (generalized)  Rationale for Evaluation and Treatment: Rehabilitation  ONSET DATE: L reverse TSA on 04/16/23   SUBJECTIVE:                                                                                                                                                                                     SUBJECTIVE STATEMENT:  06/14/2023 Mild soreness after last session but resolved quickly. No pain today. HEP going well. No other new updates   EVAL: I have been using CPM machine and doing light activities in the home like washing cups and not trying to carry anything heavy, L TSA on 10/21/ 24 by Dr August Saucer.  MD as DC sling. I feel like it is a toothache pain last night. I would like to be able to return to yard work and my mud room. I am having to maneuver clothes so I can try to dress myself.  I have been here before with Lelon Mast last time  PERTINENT HISTORY: allergies, asthma, anemia, fibromyalgia, GERD, HLD, HTN, hypothyroidism, R  TSA, bil TKA, basal cell face CA, R RTC before R TSA, bunion on R foot, hernia repair,    PAIN:  Are you having pain? NPRS scale: 0/10 Pain location: Left shoulder Pain description: Achy Aggravating  factors: lying on shoulder initially hurts but then adjusts.  Cannot drive,  I can shower but I assist my left arm with my right, I compensate and tie my bra in the front to dress.  Relieving factors: Resting Toothache pain  PRECAUTIONS: Per 05/28/23 Dr. August Saucer note: "plan at this time is continue with strengthening plus home exercise program"  RED FLAGS: None   WEIGHT BEARING RESTRICTIONS: Yes No weight bearing until released for AROM next visit  FALLS:  Has patient fallen in last 6 months?   YES  slipped on deck steps but no issues other than that time.  LIVING ENVIRONMENT: Lives with: lives with their family and including dtr SIL and grandkids Lives in: House/apartment Stairs: Yes: External: 5 steps; can reach both Has following equipment at home: Single point cane, Walker - 2 wheeled, Wheelchair (manual), Shower bench, and bed side commode  OCCUPATION: Retired in Community education officer   PLOF: Independent  PATIENT GOALS:To use my arm with out limitation   OBJECTIVE:  Note: Objective measures were completed at Evaluation unless otherwise noted. PATIENT SURVEYS:  FOTO 32%  predicted 54% FOTO 05/21/23 50%   POSTURE: Rounded shoulders  Right shoulder more forward than Left  UPPER EXTREMITY ROM:   Active ROM Right eval Left eval Left 05/08/23 Left 05/10/2023 Left 05/16/2023 Left 05/29/23 Left 06/12/23 06/14/23  Shoulder flexion 131/ PROM 145 NT to surgery 04-16-23 PROM 85 100 PROM 120 PROM P: 132 deg painless A: 106 deg (standing)  A: 142 deg (supine) AA: supine, 144 deg  Shoulder extension 125/ PROM 142         Shoulder abduction   PROM 60   P: 102 deg mild transient pain    Shoulder adduction          Shoulder internal rotation          Shoulder external rotation   PROM 20 30  PROM      Elbow flexion          Elbow extension          Wrist flexion          Wrist extension          Wrist ulnar deviation          Wrist radial deviation          Wrist pronation          Wrist supination          (Blank rows = not tested)  UPPER EXTREMITY MMT:not formally assess at eval d/t ROM deficits and post-op status   MMT Right eval Left eval  Shoulder flexion  NT due to recent TSA surgery < 2 weeks  Shoulder extension    Shoulder abduction    Shoulder adduction    Shoulder internal rotation    Shoulder external rotation    Middle trapezius    Lower trapezius    Elbow flexion    Elbow extension    Wrist flexion    Wrist extension    Wrist ulnar deviation    Wrist radial deviation    Wrist pronation    Wrist supination    Grip strength (lbs)    (Blank rows = not tested)  SHOULDER SPECIAL TESTS: NT due to L TSA reverse  JOINT MOBILITY TESTING:  NT  PALPATION:  Pt with steri strips on surgical site well intact 06/14/23: pt with concordant tenderness L infraspinatus/teres muscle bellies. No bony tenderness scapular spine or acromion    TODAY'S TREATMENT  [facility] Adult PT Treatment:                                                DATE: 06/14/23 Therapeutic Exercise: Supine short lever shoulder flex AAROM hands clasped 2x8 Supine punch + OH pullover 2x8 cues for sequencing Sidelying L GH ER w/ towel support 2x12 cues for positioning Sidelying shoulder abduction <90 deg x5 Standing shoulder flexion AAROM w/ swiss ball 2x12 HEP update + education/handout  Manual Therapy: PROM shoulder flex/abd/scap/ER to pt tolerance    [facility] Adult PT Treatment:                                                DATE: 06/12/23 Therapeutic Exercise: Standing UE ranger flexion AAROM x8  Standing UE ranger scaption AAROM x8 Shoulder flexion isometric x8 Shoulder abduction isometric x8 cues for setup/alignment  Shoulder abduction AROM sidelying (<90 deg) 2x5 cues for  positioning and comfortable ROM  Manual Therapy: PROM L GH flex/abd/ER within pt tolerance    [facility] Adult PT Treatment:                                                DATE: 06/07/23 Therapeutic Exercise: Supine punch x15  Supine GH flexion AROM x8 to pt tolerance Sidelying L GH abduction AROM 2x8 cues for setup and appropriate positioning/ROM Sidelying L GH ER to neutral 2x8 Seated passive shoulder flexion slide x10 L GH Seated passive shoulder scaption slide x10 L GH Seated passive shoulder abduction x10 L GH  UE ranger standing L GH flexion x10 HEP update + education/handout  Manual Therapy: Supine PROM to pt tolerance abd/flex/scaption/ER     PATIENT EDUCATION: Education details: rationale for interventions, HEP  Person educated: Patient Education method: Explanation, Demonstration, Tactile cues, Verbal cues Education comprehension: verbalized understanding, returned demonstration, verbal cues required, tactile cues required, and needs further education     HOME EXERCISE PROGRAM: Access Code: [email or web address]   ASSESSMENT: CLINICAL IMPRESSION: 06/14/2023 Pt arrives w/o pain, no issues after last session. Today continuing to progress shoulder AROM/AAROM in gravity reduced positions, increasing volume for shoulder ER AROM to neutral. Pt tolerates well overall, primarily limited by muscular fatigue. No adverse events or pain on departure. Recommend continuing along current POC in order to address relevant deficits and improve functional tolerance. Pt departs today's session in no acute distress, all voiced questions/concerns addressed appropriately from PT perspective.     EVAL: Patient is a 62 y.o. female who was seen today for physical therapy evaluation and treatment for 2 weeks s/p reverse total shoulder arthroplasty. L TSA on 04/16/23 by Dr Rise Paganini. Pt demonstrates decreased Left shoulder AROM, strength and also some decreased AROM of R shoulder from previous surgery. She  does not complain of pain on entering clinic but does have discomfort and  pain and difficulty with ADLs/IADLs, driving, sleep quality, and participation in normal daily activities especially yard work which she desires ot return.. She requires skilled PT services at this time to address all relevant deficits and return to PLOF   OBJECTIVE IMPAIRMENTS: decreased activity tolerance, decreased endurance,  decreased ROM, decreased strength, impaired UE functional use, postural dysfunction, and pain.   ACTIVITY LIMITATIONS: carrying, lifting, sleeping, bathing, dressing, reach over head, and hygiene/grooming  PARTICIPATION LIMITATIONS: meal prep, cleaning, laundry, driving, shopping, community activity, and yard work  PERSONAL FACTORS: allergies, asthma, anemia, fibromyalgia, GERD, HLD, HTN, hypothyroidism, R TSA, bil TKA, basal cell face CA, R RTC before R TSA, bunion on R foot, hernia repair are also affecting patient's functional outcome.    GOALS: Goals reviewed with patient? Yes  SHORT TERM GOALS: Target date: 05-31-23  Pt will be independent with initial HEP Baseline: 05/31/23: pt reports good performance of HEP overall Goal status: MET  2.  Pt pain level will not be greater than 4/10 with active motion Baseline: Pt post surgery 2 weeks, only isometric ex 05/31/23: no pain with movement, just heaviness Goal status: MET  3.  Pt will achieve 90 degrees flexion  AROM Baseline: NT due to post surgery eval 05/31/23: able to achieve >90 deg gravity eliminated AROM Goal status: PARTIALLY MET  LONG TERM GOALS: Target date: 06-28-23  Pt will be independent with advanced HEP Baseline: needs reinforcement has had R TSR reverse Goal status: INITIAL  2.  FOTO will improve from 32%  to   54% indicating improved functional mobility.  Baseline: eval 32% Goal status: INITIAL  3.  Left shoulder IR and ER will return to [facility] to return to 2/10 or less NPS for as dressing and grooming Baseline: Pt  unable to dress without compensation and limited motion Goal status: INITIAL  4.  Pt will be able to sleep on Left side without awaking due to pain Baseline: Pt post surgery 04-16-23 avoided lying on left side but now initial discomfort with lying on left side  Goal status: INITIAL  5.  Left  shoulder AROM scaption will improve to minimally 0-140 degrees for improved overhead reaching Baseline: SEE AROM chart Goal status: INITIAL  6.  Patient will demonstrate ability to push/pull at least 40# with <3/10 pain in order to return to heavy household and yardwork tasks.  Baseline: post op 04-16-23, only light weight such as coffee cup Goal status: INITIAL   PLAN: PT FREQUENCY: 2x/week  PT DURATION: 8 weeks  PLANNED INTERVENTIONS: 97164- PT Re-evaluation, 97110-Therapeutic exercises, 97530- Therapeutic activity, 97112- Neuromuscular re-education, 97535- Self Care, 16109- Manual therapy, 97014- Electrical stimulation (unattended), [id number]- Electrical stimulation (manual), Patient/Family education, Taping, Dry Needling, Joint mobilization, Cryotherapy, and Moist heat  PLAN FOR NEXT SESSION: continue with isometrics, AAROM/AROM in standing positions. Consider introduction on manual isometrics next session, lightly resisted periscapular work.    Ashley Murrain PT, DPT 06/14/2023 12:37 PM

## 2023-06-14 ENCOUNTER — Encounter: Payer: Self-pay | Admitting: Physical Therapy

## 2023-06-14 ENCOUNTER — Ambulatory Visit: Payer: Medicaid Other | Admitting: Physical Therapy

## 2023-06-14 DIAGNOSIS — M25512 Pain in left shoulder: Secondary | ICD-10-CM | POA: Diagnosis not present

## 2023-06-14 DIAGNOSIS — M6281 Muscle weakness (generalized): Secondary | ICD-10-CM

## 2023-06-15 NOTE — Therapy (Signed)
OUTPATIENT PHYSICAL THERAPY SHOULDER TREATMENT   Patient Name: Denise Morales MRN: [id number] DOB:05-18-1961, 62 y.o., female Today's Date: 06/18/2023   END OF SESSION:  PT End of Session - 06/18/23 1103     Visit Number 14    Number of Visits 17    Date for PT Re-Evaluation 06/28/23    Authorization Type [employer]    Authorization Time Period 05/07/23 - 08/05/23    Authorization - Visit Number 13    Authorization - Number of Visits 15    PT Start Time 1103    PT Stop Time 1144    PT Time Calculation (min) 41 min    Activity Tolerance Patient tolerated treatment well             Past Medical History:  Diagnosis Date   Allergy    Anemia    Anxiety    Arthritis    Depression    Fibromyalgia    GERD (gastroesophageal reflux disease)    Heart murmur    History of hiatal hernia    Hyperlipidemia    Hypertension    Hypothyroidism    Melanoma (HCC)    face   Past Surgical History:  Procedure Laterality Date   ABDOMINAL HYSTERECTOMY  1985   AIKEN OSTEOTOMY Right 06/05/2022   Procedure: Quintella Reichert OSTEOTOMY;  Surgeon: Candelaria Stagers, DPM;  Location: [facility];  Service: Podiatry;  Laterality: Right;   BICEPT TENODESIS Right 12/05/2022   Procedure: RIGHT BICEPS TENODESIS;  Surgeon: Cammy Copa, MD;  Location: [facility] OR;  Service: Orthopedics;  Laterality: Right;   CAPSULOTOMY METATARSOPHALANGEAL Right 06/05/2022   Procedure: CAPSULOTOMY METATARSOPHALANGEAL;  Surgeon: Candelaria Stagers, DPM;  Location: [facility];  Service: Podiatry;  Laterality: Right;   CHOLECYSTECTOMY     COLONOSCOPY     HALLUX VALGUS LAPIDUS Right 06/05/2022   Procedure: HALLUX VALGUS LAPIDUS;  Surgeon: Candelaria Stagers, DPM;  Location: [facility];  Service: Podiatry;  Laterality: Right;   HAMMER TOE SURGERY Right 06/05/2022   Procedure: HAMMER TOE CORRECTION;  Surgeon: Candelaria Stagers, DPM;  Location: [facility];  Service: Podiatry;   Laterality: Right;   REVERSE SHOULDER ARTHROPLASTY Right 12/05/2022   Procedure: RIGHT REVERSE SHOULDER ARTHROPLASTY;  Surgeon: Cammy Copa, MD;  Location: [facility] OR;  Service: Orthopedics;  Laterality: Right;   REVERSE SHOULDER ARTHROPLASTY Left 04/16/2023   Procedure: REVERSE SHOULDER ARTHROPLASTY;  Surgeon: Cammy Copa, MD;  Location: [facility] OR;  Service: Orthopedics;  Laterality: Left;   SHOULDER ARTHROSCOPY WITH ROTATOR CUFF REPAIR AND SUBACROMIAL DECOMPRESSION Right 08/12/2020   Procedure: RIGHT SHOULDER ARTHROSCOPY WITH ROTATOR CUFF REPAIR AND SUBACROMIAL DECOMPRESSION;  Surgeon: Kathryne Hitch, MD;  Location: [facility];  Service: Orthopedics;  Laterality: Right;   TOTAL KNEE ARTHROPLASTY Right 12/24/2020   Procedure: RIGHT TOTAL KNEE ARTHROPLASTY;  Surgeon: Kathryne Hitch, MD;  Location: [facility];  Service: Orthopedics;  Laterality: Right;   TOTAL KNEE ARTHROPLASTY Left 07/15/2021   Procedure: LEFT TOTAL KNEE ARTHROPLASTY;  Surgeon: Kathryne Hitch, MD;  Location: [facility];  Service: Orthopedics;  Laterality: Left;   XI ROBOTIC ASSISTED PARAESOPHAGEAL HERNIA REPAIR N/A 03/12/2023   Procedure: XI ROBOTIC ASSISTED PARAESOPHAGEAL HERNIA REPAIR WITH FUNDOPLICATION;  Surgeon: Berna Bue, MD;  Location: [facility];  Service: General;  Laterality: N/A;  180   Patient Active Problem List   Diagnosis Date Noted   Arthritis of left shoulder region 04/24/2023   Biceps  tendonitis, left 04/24/2023   S/P reverse total shoulder arthroplasty, left 04/16/2023   Depression, recurrent (HCC) 04/03/2023   S/P repair of paraesophageal hernia 03/12/2023   Arthritis of right shoulder region 12/24/2022   Biceps tendonitis on right 12/24/2022   S/P reverse total shoulder arthroplasty, right 12/05/2022   Vertigo 11/27/2022   Iron deficiency anemia 10/30/2022   Physical exam, annual 10/30/2022   Medication management 10/30/2022   Class 2 severe obesity due to  excess calories with serious comorbidity in adult, unspecified BMI (HCC) 10/23/2022   Essential hypertension 10/23/2022   Prediabetes 10/23/2022   Hypothyroidism 10/23/2022   Insomnia 06/14/2022   Status post total left knee replacement 07/15/2021   Neoplasm of uncertain behavior of skin 05/25/2021   Generalized osteoarthritis of multiple sites 05/25/2021   Fibromyalgia syndrome 05/25/2021   Restless legs 03/16/2021   Hyperlipidemia 03/16/2021   Anxiety 03/16/2021   Status post right knee replacement 12/24/2020   Unilateral primary osteoarthritis, left knee 11/11/2020   Unilateral primary osteoarthritis, right knee 11/11/2020   Complete tear of right rotator cuff 08/12/2020   Hypertensive disorder 05/05/2019    PCP: Park Meo, FNP   REFERRING PROVIDER: Park Meo, FNP   REFERRING DIAG: [phone] (ICD-10-CM) - History of arthroplasty of left shoulder   THERAPY DIAG:  Acute pain of left shoulder  Muscle weakness (generalized)  Rationale for Evaluation and Treatment: Rehabilitation  ONSET DATE: L reverse TSA on 04/16/23   SUBJECTIVE:                                                                                                                                                                                     SUBJECTIVE STATEMENT:  06/18/2023 Pt arrives w/ report of increased soreness/stiffness after a fall Thursday in which she landed on her R shoulder. States her R shoulder was fairly painful initially but since improved. L shoulder a bit sore but not overtly painful. Otherwise no new updates   EVAL: I have been using CPM machine and doing light activities in the home like washing cups and not trying to carry anything heavy, L TSA on 10/21/ 24 by Dr August Saucer.  MD as DC sling. I feel like it is a toothache pain last night. I would like to be able to return to yard work and my mud room. I am having to maneuver clothes so I can try to dress myself.  I have been here before  with Lelon Mast last time  PERTINENT HISTORY: allergies, asthma, anemia, fibromyalgia, GERD, HLD, HTN, hypothyroidism, R TSA, bil TKA, basal cell face CA, R RTC before R TSA, bunion on R foot, hernia repair,    PAIN:  Are you having pain? NPRS scale: 0/10 Pain location: Left shoulder Pain description: Achy Aggravating factors: lying on shoulder initially hurts but then adjusts.  Cannot drive,  I can shower but I assist my left arm with my right, I compensate and tie my bra in the front to dress.  Relieving factors: Resting Toothache pain  PRECAUTIONS: Per 05/28/23 Dr. August Saucer note: "plan at this time is continue with strengthening plus home exercise program"  RED FLAGS: None   WEIGHT BEARING RESTRICTIONS: Yes No weight bearing until released for AROM next visit  FALLS:  Has patient fallen in last 6 months?   YES  slipped on deck steps but no issues other than that time.  LIVING ENVIRONMENT: Lives with: lives with their family and including dtr SIL and grandkids Lives in: House/apartment Stairs: Yes: External: 5 steps; can reach both Has following equipment at home: Single point cane, Walker - 2 wheeled, Wheelchair (manual), Shower bench, and bed side commode  OCCUPATION: Retired in Community education officer   PLOF: Independent  PATIENT GOALS:To use my arm with out limitation   OBJECTIVE:  Note: Objective measures were completed at Evaluation unless otherwise noted. PATIENT SURVEYS:  FOTO 32%  predicted 54% FOTO 05/21/23 50%   POSTURE: Rounded shoulders  Right shoulder more forward than Left  UPPER EXTREMITY ROM:   Active ROM Right eval Left eval Left 05/08/23 Left 05/10/2023 Left 05/16/2023 Left 05/29/23 Left 06/12/23 06/14/23  Shoulder flexion 131/ PROM 145 NT to surgery 04-16-23 PROM 85 100 PROM 120 PROM P: 132 deg painless A: 106 deg (standing)  A: 142 deg (supine) AA: supine, 144 deg  Shoulder extension 125/ PROM 142         Shoulder abduction   PROM 60   P: 102 deg mild  transient pain    Shoulder adduction          Shoulder internal rotation          Shoulder external rotation   PROM 20 30 PROM      Elbow flexion          Elbow extension          Wrist flexion          Wrist extension          Wrist ulnar deviation          Wrist radial deviation          Wrist pronation          Wrist supination          (Blank rows = not tested)  UPPER EXTREMITY MMT:not formally assess at eval d/t ROM deficits and post-op status   MMT Right eval Left eval  Shoulder flexion  NT due to recent TSA surgery < 2 weeks  Shoulder extension    Shoulder abduction    Shoulder adduction    Shoulder internal rotation    Shoulder external rotation    Middle trapezius    Lower trapezius    Elbow flexion    Elbow extension    Wrist flexion    Wrist extension    Wrist ulnar deviation    Wrist radial deviation    Wrist pronation    Wrist supination    Grip strength (lbs)    (Blank rows = not tested)  SHOULDER SPECIAL TESTS: NT due to L TSA reverse  JOINT MOBILITY TESTING:  NT  PALPATION:  Pt with steri strips on surgical site well intact  06/14/23: pt with concordant tenderness  L infraspinatus/teres muscle bellies. No bony tenderness scapular spine or acromion  MISCELLANEOUS:  06/18/23:  - R shoulder w/ no overt bony tenderness, does have some tenderness about bicipital groove but no apparent weakness at elbow or shoulder with active movement. Shoulder AROM appears stiff but not painful and pt states comparable to her normal - L shoulder w/ PROM comparable to prior session, no increased tenderness compared to prior sessions    TODAY'S TREATMENT [facility] Adult PT Treatment:                                                DATE: 06/18/23 Therapeutic Exercise: Supine short lever flexion AAROM 3x5 Seated shoulder flexion slides w/ towel and slideboard 3x8 cues for comfortable ROM Scaption slideboard 3x8 cues for comfortable ROM Verbal HEP  review/education  Manual Therapy: PROM shoulder flex/abd/scap/ER to pt tolerance  Therapeutic Activity: MSK assessment + education, communication w/ provider as needed, monitoring for adverse symptoms    [facility] Adult PT Treatment:                                                DATE: 06/14/23 Therapeutic Exercise: Supine short lever shoulder flex AAROM hands clasped 2x8 Supine punch + OH pullover 2x8 cues for sequencing Sidelying L GH ER w/ towel support 2x12 cues for positioning Sidelying shoulder abduction <90 deg x5 Standing shoulder flexion AAROM w/ swiss ball 2x12 HEP update + education/handout  Manual Therapy: PROM shoulder flex/abd/scap/ER to pt tolerance    [facility] Adult PT Treatment:                                                DATE: 06/12/23 Therapeutic Exercise: Standing UE ranger flexion AAROM x8  Standing UE ranger scaption AAROM x8 Shoulder flexion isometric x8 Shoulder abduction isometric x8 cues for setup/alignment  Shoulder abduction AROM sidelying (<90 deg) 2x5 cues for positioning and comfortable ROM  Manual Therapy: PROM L GH flex/abd/ER within pt tolerance   PATIENT EDUCATION: Education details: rationale for interventions, HEP  Person educated: Patient Education method: Explanation, Demonstration, Tactile cues, Verbal cues Education comprehension: verbalized understanding, returned demonstration, verbal cues required, tactile cues required, and needs further education     HOME EXERCISE PROGRAM: Access Code: [email or web address]   ASSESSMENT: CLINICAL IMPRESSION: 06/18/2023 Pt arrives w/o overt pain but does endorse increased heaviness/stiffness after a fall last week, fell on R shoulder. MSK assessment is reassuring overall - pt denies swelling, no bony tenderness and has painless but stiff AROM of R shoulder. Some tenderness at bicipital groove. No overt red flags apparent today, encouraged close monitoring and communication w/ provider as appropriate/indicated.  L shoulder with report of increased heaviness but tolerates PROM well without any pain and movement comparable to prior sessions. Does endorse increased fatigue with activity, subsequently modified for more passive mobility today. No adverse events, 1.5-2/10 pain on departure. Recommend continuing along current POC in order to address relevant deficits and improve functional tolerance. Pt departs today's session in no acute distress, all voiced questions/concerns addressed appropriately from PT perspective.    EVAL: Patient is a  62 y.o. female who was seen today for physical therapy evaluation and treatment for 2 weeks s/p reverse total shoulder arthroplasty. L TSA on 04/16/23 by Dr Rise Paganini. Pt demonstrates decreased Left shoulder AROM, strength and also some decreased AROM of R shoulder from previous surgery. She does not complain of pain on entering clinic but does have discomfort and  pain and difficulty with ADLs/IADLs, driving, sleep quality, and participation in normal daily activities especially yard work which she desires ot return.. She requires skilled PT services at this time to address all relevant deficits and return to PLOF   OBJECTIVE IMPAIRMENTS: decreased activity tolerance, decreased endurance, decreased ROM, decreased strength, impaired UE functional use, postural dysfunction, and pain.   ACTIVITY LIMITATIONS: carrying, lifting, sleeping, bathing, dressing, reach over head, and hygiene/grooming  PARTICIPATION LIMITATIONS: meal prep, cleaning, laundry, driving, shopping, community activity, and yard work  PERSONAL FACTORS: allergies, asthma, anemia, fibromyalgia, GERD, HLD, HTN, hypothyroidism, R TSA, bil TKA, basal cell face CA, R RTC before R TSA, bunion on R foot, hernia repair are also affecting patient's functional outcome.    GOALS: Goals reviewed with patient? Yes  SHORT TERM GOALS: Target date: 05-31-23  Pt will be independent with initial HEP Baseline: 05/31/23: pt  reports good performance of HEP overall Goal status: MET  2.  Pt pain level will not be greater than 4/10 with active motion Baseline: Pt post surgery 2 weeks, only isometric ex 05/31/23: no pain with movement, just heaviness Goal status: MET  3.  Pt will achieve 90 degrees flexion  AROM Baseline: NT due to post surgery eval 05/31/23: able to achieve >90 deg gravity eliminated AROM Goal status: PARTIALLY MET  LONG TERM GOALS: Target date: 06-28-23  Pt will be independent with advanced HEP Baseline: needs reinforcement has had R TSR reverse Goal status: INITIAL  2.  FOTO will improve from 32%  to   54% indicating improved functional mobility.  Baseline: eval 32% Goal status: INITIAL  3.  Left shoulder IR and ER will return to [facility] to return to 2/10 or less NPS for as dressing and grooming Baseline: Pt unable to dress without compensation and limited motion Goal status: INITIAL  4.  Pt will be able to sleep on Left side without awaking due to pain Baseline: Pt post surgery 04-16-23 avoided lying on left side but now initial discomfort with lying on left side  Goal status: INITIAL  5.  Left  shoulder AROM scaption will improve to minimally 0-140 degrees for improved overhead reaching Baseline: SEE AROM chart Goal status: INITIAL  6.  Patient will demonstrate ability to push/pull at least 40# with <3/10 pain in order to return to heavy household and yardwork tasks.  Baseline: post op 04-16-23, only light weight such as coffee cup Goal status: INITIAL   PLAN: PT FREQUENCY: 2x/week  PT DURATION: 8 weeks  PLANNED INTERVENTIONS: 97164- PT Re-evaluation, 97110-Therapeutic exercises, 97530- Therapeutic activity, 97112- Neuromuscular re-education, 97535- Self Care, 19147- Manual therapy, 97014- Electrical stimulation (unattended), [id number]- Electrical stimulation (manual), Patient/Family education, Taping, Dry Needling, Joint mobilization, Cryotherapy, and Moist heat  PLAN FOR NEXT  SESSION: continue with isometrics, AAROM/AROM in standing positions. Consider introduction on manual isometrics next session, lightly resisted periscapular work.    Ashley Murrain PT, DPT 06/18/2023 12:49 PM

## 2023-06-18 ENCOUNTER — Encounter: Payer: Self-pay | Admitting: Physical Therapy

## 2023-06-18 ENCOUNTER — Ambulatory Visit: Payer: Medicaid Other | Admitting: Physical Therapy

## 2023-06-18 DIAGNOSIS — M25512 Pain in left shoulder: Secondary | ICD-10-CM

## 2023-06-18 DIAGNOSIS — M6281 Muscle weakness (generalized): Secondary | ICD-10-CM

## 2023-06-19 ENCOUNTER — Ambulatory Visit: Payer: Medicaid Other | Admitting: Physical Therapy

## 2023-06-25 ENCOUNTER — Ambulatory Visit: Payer: Medicaid Other | Admitting: Physical Therapy

## 2023-06-25 ENCOUNTER — Encounter: Payer: Self-pay | Admitting: Physical Therapy

## 2023-06-25 DIAGNOSIS — M25512 Pain in left shoulder: Secondary | ICD-10-CM

## 2023-06-25 DIAGNOSIS — M6281 Muscle weakness (generalized): Secondary | ICD-10-CM

## 2023-06-25 NOTE — Therapy (Signed)
OUTPATIENT PHYSICAL THERAPY SHOULDER TREATMENT   Patient Name: Denise Morales MRN: [id number] DOB:03-06-1961, 62 y.o., female Today's Date: 06/25/2023   END OF SESSION:  PT End of Session - 06/25/23 1007     Visit Number 15    Number of Visits 17    Date for PT Re-Evaluation 06/28/23    Authorization Type [employer]    Authorization Time Period 05/07/23 - 08/05/23    Authorization - Visit Number 14    Authorization - Number of Visits 15    PT Start Time 1008    PT Stop Time 1052    PT Time Calculation (min) 44 min    Activity Tolerance Patient tolerated treatment well              Past Medical History:  Diagnosis Date   Allergy    Anemia    Anxiety    Arthritis    Depression    Fibromyalgia    GERD (gastroesophageal reflux disease)    Heart murmur    History of hiatal hernia    Hyperlipidemia    Hypertension    Hypothyroidism    Melanoma (HCC)    face   Past Surgical History:  Procedure Laterality Date   ABDOMINAL HYSTERECTOMY  1985   AIKEN OSTEOTOMY Right 06/05/2022   Procedure: Quintella Reichert OSTEOTOMY;  Surgeon: Candelaria Stagers, DPM;  Location: [facility];  Service: Podiatry;  Laterality: Right;   BICEPT TENODESIS Right 12/05/2022   Procedure: RIGHT BICEPS TENODESIS;  Surgeon: Cammy Copa, MD;  Location: [facility] OR;  Service: Orthopedics;  Laterality: Right;   CAPSULOTOMY METATARSOPHALANGEAL Right 06/05/2022   Procedure: CAPSULOTOMY METATARSOPHALANGEAL;  Surgeon: Candelaria Stagers, DPM;  Location: [facility];  Service: Podiatry;  Laterality: Right;   CHOLECYSTECTOMY     COLONOSCOPY     HALLUX VALGUS LAPIDUS Right 06/05/2022   Procedure: HALLUX VALGUS LAPIDUS;  Surgeon: Candelaria Stagers, DPM;  Location: [facility];  Service: Podiatry;  Laterality: Right;   HAMMER TOE SURGERY Right 06/05/2022   Procedure: HAMMER TOE CORRECTION;  Surgeon: Candelaria Stagers, DPM;  Location: [facility];  Service:  Podiatry;  Laterality: Right;   REVERSE SHOULDER ARTHROPLASTY Right 12/05/2022   Procedure: RIGHT REVERSE SHOULDER ARTHROPLASTY;  Surgeon: Cammy Copa, MD;  Location: [facility] OR;  Service: Orthopedics;  Laterality: Right;   REVERSE SHOULDER ARTHROPLASTY Left 04/16/2023   Procedure: REVERSE SHOULDER ARTHROPLASTY;  Surgeon: Cammy Copa, MD;  Location: [facility] OR;  Service: Orthopedics;  Laterality: Left;   SHOULDER ARTHROSCOPY WITH ROTATOR CUFF REPAIR AND SUBACROMIAL DECOMPRESSION Right 08/12/2020   Procedure: RIGHT SHOULDER ARTHROSCOPY WITH ROTATOR CUFF REPAIR AND SUBACROMIAL DECOMPRESSION;  Surgeon: Kathryne Hitch, MD;  Location: [facility];  Service: Orthopedics;  Laterality: Right;   TOTAL KNEE ARTHROPLASTY Right 12/24/2020   Procedure: RIGHT TOTAL KNEE ARTHROPLASTY;  Surgeon: Kathryne Hitch, MD;  Location: [facility];  Service: Orthopedics;  Laterality: Right;   TOTAL KNEE ARTHROPLASTY Left 07/15/2021   Procedure: LEFT TOTAL KNEE ARTHROPLASTY;  Surgeon: Kathryne Hitch, MD;  Location: [facility];  Service: Orthopedics;  Laterality: Left;   XI ROBOTIC ASSISTED PARAESOPHAGEAL HERNIA REPAIR N/A 03/12/2023   Procedure: XI ROBOTIC ASSISTED PARAESOPHAGEAL HERNIA REPAIR WITH FUNDOPLICATION;  Surgeon: Berna Bue, MD;  Location: [facility];  Service: General;  Laterality: N/A;  180   Patient Active Problem List   Diagnosis Date Noted   Arthritis of left shoulder region 04/24/2023  Biceps tendonitis, left 04/24/2023   S/P reverse total shoulder arthroplasty, left 04/16/2023   Depression, recurrent (HCC) 04/03/2023   S/P repair of paraesophageal hernia 03/12/2023   Arthritis of right shoulder region 12/24/2022   Biceps tendonitis on right 12/24/2022   S/P reverse total shoulder arthroplasty, right 12/05/2022   Vertigo 11/27/2022   Iron deficiency anemia 10/30/2022   Physical exam, annual 10/30/2022   Medication management 10/30/2022   Class 2 severe obesity  due to excess calories with serious comorbidity in adult, unspecified BMI (HCC) 10/23/2022   Essential hypertension 10/23/2022   Prediabetes 10/23/2022   Hypothyroidism 10/23/2022   Insomnia 06/14/2022   Status post total left knee replacement 07/15/2021   Neoplasm of uncertain behavior of skin 05/25/2021   Generalized osteoarthritis of multiple sites 05/25/2021   Fibromyalgia syndrome 05/25/2021   Restless legs 03/16/2021   Hyperlipidemia 03/16/2021   Anxiety 03/16/2021   Status post right knee replacement 12/24/2020   Unilateral primary osteoarthritis, left knee 11/11/2020   Unilateral primary osteoarthritis, right knee 11/11/2020   Complete tear of right rotator cuff 08/12/2020   Hypertensive disorder 05/05/2019    PCP: Park Meo, FNP   REFERRING PROVIDER: Park Meo, FNP   REFERRING DIAG: [phone] (ICD-10-CM) - History of arthroplasty of left shoulder   THERAPY DIAG:  Acute pain of left shoulder  Muscle weakness (generalized)  Rationale for Evaluation and Treatment: Rehabilitation  ONSET DATE: L reverse TSA on 04/16/23   SUBJECTIVE:                                                                                                                                                                                     SUBJECTIVE STATEMENT:  06/25/2023 Pt states she is doing okay today, notes she gets some deltoid cramping with doing dishes. Feels back to baseline after fall. No pain at present. Still feels heaviness of arm is primary issue, but is able to use her arm better and is reaching better.   EVAL: I have been using CPM machine and doing light activities in the home like washing cups and not trying to carry anything heavy, L TSA on 10/21/ 24 by Dr August Saucer.  MD as DC sling. I feel like it is a toothache pain last night. I would like to be able to return to yard work and my mud room. I am having to maneuver clothes so I can try to dress myself.  I have been here before  with Lelon Mast last time  PERTINENT HISTORY: allergies, asthma, anemia, fibromyalgia, GERD, HLD, HTN, hypothyroidism, R TSA, bil TKA, basal cell face CA, R RTC before R TSA, bunion on R foot, hernia repair,  PAIN:  Are you having pain? NPRS scale: 0/10 Pain location: Left shoulder Pain description: Achy Aggravating factors: lying on shoulder initially hurts but then adjusts.  Cannot drive,  I can shower but I assist my left arm with my right, I compensate and tie my bra in the front to dress.  Relieving factors: Resting Toothache pain  PRECAUTIONS: Per 05/28/23 Dr. August Saucer note: "plan at this time is continue with strengthening plus home exercise program"  RED FLAGS: None   WEIGHT BEARING RESTRICTIONS: Yes No weight bearing until released for AROM next visit  FALLS:  Has patient fallen in last 6 months?   YES  slipped on deck steps but no issues other than that time.  LIVING ENVIRONMENT: Lives with: lives with their family and including dtr SIL and grandkids Lives in: House/apartment Stairs: Yes: External: 5 steps; can reach both Has following equipment at home: Single point cane, Walker - 2 wheeled, Wheelchair (manual), Shower bench, and bed side commode  OCCUPATION: Retired in Community education officer   PLOF: Independent  PATIENT GOALS:To use my arm with out limitation   OBJECTIVE:  Note: Objective measures were completed at Evaluation unless otherwise noted. PATIENT SURVEYS:  FOTO 32%  predicted 54% FOTO 05/21/23 50%   POSTURE: Rounded shoulders  Right shoulder more forward than Left  UPPER EXTREMITY ROM:   Active ROM Right eval Left eval Left 05/08/23 Left 05/10/2023 Left 05/16/2023 Left 05/29/23 Left 06/12/23 06/14/23 06/25/23  Shoulder flexion 131/ PROM 145 NT to surgery 04-16-23 PROM 85 100 PROM 120 PROM P: 132 deg painless A: 106 deg (standing)  A: 142 deg (supine) AA: supine, 144 deg A: 130deg seated, painless  Shoulder extension 125/ PROM 142          Shoulder  abduction   PROM 60   P: 102 deg mild transient pain     Shoulder adduction           Shoulder internal rotation           Shoulder external rotation   PROM 20 30 PROM       Elbow flexion           Elbow extension           Wrist flexion           Wrist extension           Wrist ulnar deviation           Wrist radial deviation           Wrist pronation           Wrist supination           (Blank rows = not tested)  UPPER EXTREMITY MMT:not formally assess at eval d/t ROM deficits and post-op status   MMT Right eval Left eval  Shoulder flexion  NT due to recent TSA surgery < 2 weeks  Shoulder extension    Shoulder abduction    Shoulder adduction    Shoulder internal rotation    Shoulder external rotation    Middle trapezius    Lower trapezius    Elbow flexion    Elbow extension    Wrist flexion    Wrist extension    Wrist ulnar deviation    Wrist radial deviation    Wrist pronation    Wrist supination    Grip strength (lbs)    (Blank rows = not tested)  SHOULDER SPECIAL TESTS: NT due to L TSA reverse  JOINT MOBILITY TESTING:  NT  PALPATION:  Pt with steri strips on surgical site well intact  06/14/23: pt with concordant tenderness L infraspinatus/teres muscle bellies. No bony tenderness scapular spine or acromion  MISCELLANEOUS:  06/18/23:  - R shoulder w/ no overt bony tenderness, does have some tenderness about bicipital groove but no apparent weakness at elbow or shoulder with active movement. Shoulder AROM appears stiff but not painful and pt states comparable to her normal - L shoulder w/ PROM comparable to prior session, no increased tenderness compared to prior sessions    TODAY'S TREATMENT [facility] Adult PT Treatment:                                                DATE: 06/25/23 Therapeutic Exercise: Supine punch x15 Supine long lever shoulder flexion AROM x15 Seated shoulder flexion AROM <90 deg 2x5 Standing flexion AAROM swiss ball at table  x10 Standing abduction AAROM swiss ball x10 LUE Red band row 2x8 cues for form and comfortable ROM, elbow mechanics Yellow band shoulder extension x5 cues for posture, shoulder/elbow positioning, appropriate ROM and reduced compensations Sidelying L GH AROM x8 cues for shoulder/elbow positioning   Manual Therapy: Supine PROM to pt tolerance flex/abd/scaption/ER    [facility] Adult PT Treatment:                                                DATE: 06/18/23 Therapeutic Exercise: Supine short lever flexion AAROM 3x5 Seated shoulder flexion slides w/ towel and slideboard 3x8 cues for comfortable ROM Scaption slideboard 3x8 cues for comfortable ROM Verbal HEP review/education  Manual Therapy: PROM shoulder flex/abd/scap/ER to pt tolerance  Therapeutic Activity: MSK assessment + education, communication w/ provider as needed, monitoring for adverse symptoms    [facility] Adult PT Treatment:                                                DATE: 06/14/23 Therapeutic Exercise: Supine short lever shoulder flex AAROM hands clasped 2x8 Supine punch + OH pullover 2x8 cues for sequencing Sidelying L GH ER w/ towel support 2x12 cues for positioning Sidelying shoulder abduction <90 deg x5 Standing shoulder flexion AAROM w/ swiss ball 2x12 HEP update + education/handout  Manual Therapy: PROM shoulder flex/abd/scap/ER to pt tolerance   PATIENT EDUCATION: Education details: rationale for interventions, HEP  Person educated: Patient Education method: Explanation, Demonstration, Tactile cues, Verbal cues Education comprehension: verbalized understanding, returned demonstration, verbal cues required, tactile cues required, and needs further education     HOME EXERCISE PROGRAM: Access Code: [email or web address]   ASSESSMENT: CLINICAL IMPRESSION: 06/25/2023 Pt arrives w/o pain, states she is back to baseline after fall. Pt continues to endorse heaviness as primary limiting factor, although she continues to  progress w/ activities; noted improvement in flexion AROM against gravity today. Tolerates activity well with primary report of muscular fatigue. No adverse events, pt denies any increase in pain on departure. Plan for re-assessment and auth next session. Pt departs today's session in no acute distress, all voiced questions/concerns addressed appropriately from PT perspective.    EVAL: Patient is a 62 y.o. female  who was seen today for physical therapy evaluation and treatment for 2 weeks s/p reverse total shoulder arthroplasty. L TSA on 04/16/23 by Dr Rise Paganini. Pt demonstrates decreased Left shoulder AROM, strength and also some decreased AROM of R shoulder from previous surgery. She does not complain of pain on entering clinic but does have discomfort and  pain and difficulty with ADLs/IADLs, driving, sleep quality, and participation in normal daily activities especially yard work which she desires ot return.. She requires skilled PT services at this time to address all relevant deficits and return to PLOF   OBJECTIVE IMPAIRMENTS: decreased activity tolerance, decreased endurance, decreased ROM, decreased strength, impaired UE functional use, postural dysfunction, and pain.   ACTIVITY LIMITATIONS: carrying, lifting, sleeping, bathing, dressing, reach over head, and hygiene/grooming  PARTICIPATION LIMITATIONS: meal prep, cleaning, laundry, driving, shopping, community activity, and yard work  PERSONAL FACTORS: allergies, asthma, anemia, fibromyalgia, GERD, HLD, HTN, hypothyroidism, R TSA, bil TKA, basal cell face CA, R RTC before R TSA, bunion on R foot, hernia repair are also affecting patient's functional outcome.    GOALS: Goals reviewed with patient? Yes  SHORT TERM GOALS: Target date: 05-31-23  Pt will be independent with initial HEP Baseline: 05/31/23: pt reports good performance of HEP overall Goal status: MET  2.  Pt pain level will not be greater than 4/10 with active  motion Baseline: Pt post surgery 2 weeks, only isometric ex 05/31/23: no pain with movement, just heaviness Goal status: MET  3.  Pt will achieve 90 degrees flexion  AROM Baseline: NT due to post surgery eval 05/31/23: able to achieve >90 deg gravity eliminated AROM Goal status: PARTIALLY MET  LONG TERM GOALS: Target date: 06-28-23  Pt will be independent with advanced HEP Baseline: needs reinforcement has had R TSR reverse Goal status: INITIAL  2.  FOTO will improve from 32%  to   54% indicating improved functional mobility.  Baseline: eval 32% Goal status: INITIAL  3.  Left shoulder IR and ER will return to [facility] to return to 2/10 or less NPS for as dressing and grooming Baseline: Pt unable to dress without compensation and limited motion Goal status: INITIAL  4.  Pt will be able to sleep on Left side without awaking due to pain Baseline: Pt post surgery 04-16-23 avoided lying on left side but now initial discomfort with lying on left side  Goal status: INITIAL  5.  Left  shoulder AROM scaption will improve to minimally 0-140 degrees for improved overhead reaching Baseline: SEE AROM chart Goal status: INITIAL  6.  Patient will demonstrate ability to push/pull at least 40# with <3/10 pain in order to return to heavy household and yardwork tasks.  Baseline: post op 04-16-23, only light weight such as coffee cup Goal status: INITIAL   PLAN: PT FREQUENCY: 2x/week  PT DURATION: 8 weeks  PLANNED INTERVENTIONS: 97164- PT Re-evaluation, 97110-Therapeutic exercises, 97530- Therapeutic activity, 97112- Neuromuscular re-education, 97535- Self Care, 32440- Manual therapy, 97014- Electrical stimulation (unattended), [id number]- Electrical stimulation (manual), Patient/Family education, Taping, Dry Needling, Joint mobilization, Cryotherapy, and Moist heat  PLAN FOR NEXT SESSION: continue with isometrics, gentle periscapular strengthening, AAROM/AROM in standing positions. Re-assessment and auth  next session.    Ashley Murrain PT, DPT 06/25/2023 10:55 AM

## 2023-06-28 NOTE — Therapy (Signed)
 OUTPATIENT PHYSICAL THERAPY PROGRESS NOTE + RECERTIFICATION   Patient Name: Genelle Economou MRN: [id number] DOB:04-08-1961, 63 y.o., female Today's Date: 06/29/2023   Progress Note Reporting Period 05/03/23 to 06/29/23  See note below for Objective Data and Assessment of Progress/Goals.      END OF SESSION:  PT End of Session - 06/29/23 1146     Visit Number 16    Number of Visits 28    Date for PT Re-Evaluation 08/10/23    Authorization Type [employer]    Authorization Time Period 05/07/23 - 08/05/23 - additional auth tbd    Authorization - Visit Number 15    Authorization - Number of Visits 15    PT Start Time 1146    PT Stop Time 1232    PT Time Calculation (min) 46 min    Activity Tolerance Patient tolerated treatment well               Past Medical History:  Diagnosis Date   Allergy    Anemia    Anxiety    Arthritis    Depression    Fibromyalgia    GERD (gastroesophageal reflux disease)    Heart murmur    History of hiatal hernia    Hyperlipidemia    Hypertension    Hypothyroidism    Melanoma (HCC)    face   Past Surgical History:  Procedure Laterality Date   ABDOMINAL HYSTERECTOMY  1985   AIKEN OSTEOTOMY Right 06/05/2022   Procedure: KATRINA OSTEOTOMY;  Surgeon: Tobie Franky SQUIBB, DPM;  Location: [facility];  Service: Podiatry;  Laterality: Right;   BICEPT TENODESIS Right 12/05/2022   Procedure: RIGHT BICEPS TENODESIS;  Surgeon: Addie Cordella Hamilton, MD;  Location: [facility] OR;  Service: Orthopedics;  Laterality: Right;   CAPSULOTOMY METATARSOPHALANGEAL Right 06/05/2022   Procedure: CAPSULOTOMY METATARSOPHALANGEAL;  Surgeon: Tobie Franky SQUIBB, DPM;  Location: [facility];  Service: Podiatry;  Laterality: Right;   CHOLECYSTECTOMY     COLONOSCOPY     HALLUX VALGUS LAPIDUS Right 06/05/2022   Procedure: HALLUX VALGUS LAPIDUS;  Surgeon: Tobie Franky SQUIBB, DPM;  Location: [facility];  Service: Podiatry;  Laterality: Right;    HAMMER TOE SURGERY Right 06/05/2022   Procedure: HAMMER TOE CORRECTION;  Surgeon: Tobie Franky SQUIBB, DPM;  Location: [name];  Service: Podiatry;  Laterality: Right;   REVERSE SHOULDER ARTHROPLASTY Right 12/05/2022   Procedure: RIGHT REVERSE SHOULDER ARTHROPLASTY;  Surgeon: Addie Cordella Hamilton, MD;  Location: [facility] OR;  Service: Orthopedics;  Laterality: Right;   REVERSE SHOULDER ARTHROPLASTY Left 04/16/2023   Procedure: REVERSE SHOULDER ARTHROPLASTY;  Surgeon: Addie Cordella Hamilton, MD;  Location: [facility] OR;  Service: Orthopedics;  Laterality: Left;   SHOULDER ARTHROSCOPY WITH ROTATOR CUFF REPAIR AND SUBACROMIAL DECOMPRESSION Right 08/12/2020   Procedure: RIGHT SHOULDER ARTHROSCOPY WITH ROTATOR CUFF REPAIR AND SUBACROMIAL DECOMPRESSION;  Surgeon: Vernetta Lonni GRADE, MD;  Location:  SUR[facility]ERY C[facility];  Service: Orthopedics;  Laterality: Right;   TOTAL KNEE ARTHROPLASTY Right 12/24/2020   Procedure: RIGHT TOTAL KNEE ARTHROPLASTY;  Surgeon: Vernetta Lonni GRADE, MD;  Location: [facility];  Service: Orthopedics;  Laterality: Right;   TOTAL KNEE ARTHROPLASTY Left 07/15/2021   Procedure: LEFT TOTAL KNEE ARTHROPLASTY;  Surgeon: Vernetta Lonni GRADE, MD;  Location: [facility];  Service: Orthopedics;  Laterality: Left;   XI ROBOTIC ASSISTED PARAESOPHAGEAL HERNIA REPAIR N/A 03/12/2023   Procedure: XI ROBOTIC ASSISTED PARAESOPHAGEAL HERNIA REPAIR WITH FUNDOPLICATION;  Surgeon: Signe Mitzie LABOR, MD;  Location: [facility];  Service: General;  Laterality: N/A;  180   Patient Active Problem List   Diagnosis Date Noted   Arthritis of left shoulder region 04/24/2023   Biceps tendonitis, left 04/24/2023   S/P reverse total shoulder arthroplasty, left 04/16/2023   Depression, recurrent (HCC) 04/03/2023   S/P repair of paraesophageal hernia 03/12/2023   Arthritis of right shoulder region 12/24/2022   Biceps tendonitis on right 12/24/2022   S/P reverse total shoulder arthroplasty, right 12/05/2022    Vertigo 11/27/2022   Iron  deficiency anemia 10/30/2022   Physical exam, annual 10/30/2022   Medication management 10/30/2022   Class 2 severe obesity due to excess calories with serious comorbidity in adult, unspecified BMI (HCC) 10/23/2022   Essential hypertension 10/23/2022   Prediabetes 10/23/2022   Hypothyroidism 10/23/2022   Insomnia 06/14/2022   Status post total left knee replacement 07/15/2021   Neoplasm of uncertain behavior of skin 05/25/2021   Generalized osteoarthritis of multiple sites 05/25/2021   Fibromyalgia syndrome 05/25/2021   Restless legs 03/16/2021   Hyperlipidemia 03/16/2021   Anxiety 03/16/2021   Status post right knee replacement 12/24/2020   Unilateral primary osteoarthritis, left knee 11/11/2020   Unilateral primary osteoarthritis, right knee 11/11/2020   Complete tear of right rotator cuff 08/12/2020   Hypertensive disorder 05/05/2019    PCP: Kayla Jeoffrey RAMAN, FNP   REFERRING PROVIDER: Shirly Carlin CROME, PA-C   REFERRING DIAG: [phone] (ICD-10-CM) - History of arthroplasty of left shoulder   THERAPY DIAG:  Acute pain of left shoulder  Muscle weakness (generalized)  Rationale for Evaluation and Treatment: Rehabilitation  ONSET DATE: L reverse TSA on 04/16/23   SUBJECTIVE:                                                                                                                                                                                     SUBJECTIVE STATEMENT:  06/29/2023 No pain at present, worst 2-3/10 over past week usually with dishes. Notes excellent improvements in ADLs (dressing, showering, etc). Still feels some heaviness in her arms and difficulty lifting. Would really like to get back to yardwork.    EVAL: I have been using CPM machine and doing light activities in the home like washing cups and not trying to carry anything heavy, L TSA on 10/21/ 24 by Dr Addie.  MD as DC sling. I feel like it is a toothache pain last night. I  would like to be able to return to yard work and my mud room. I am having to maneuver clothes so I can try to dress myself.  I have been here before with Lucie last time  PERTINENT HISTORY: allergies, asthma, anemia, fibromyalgia,  GERD, HLD, HTN, hypothyroidism, R TSA, bil TKA, basal cell face CA, R RTC before R TSA, bunion on R foot, hernia repair,    PAIN:  Are you having pain? NPRS scale: 0/10 Pain location: Left shoulder Pain description: Achy Aggravating factors: reaching, lifting Relieving factors: Resting   PRECAUTIONS: Per 05/28/23 Dr. Addie note: plan at this time is continue with strengthening plus home exercise program  RED FLAGS: None   WEIGHT BEARING RESTRICTIONS: Yes No weight bearing until released for AROM next visit  FALLS:  Has patient fallen in last 6 months?   YES  slipped on deck steps but no issues other than that time.  LIVING ENVIRONMENT: Lives with: lives with their family and including dtr SIL and grandkids Lives in: House/apartment Stairs: Yes: External: 5 steps; can reach both Has following equipment at home: Single point cane, Walker - 2 wheeled, Wheelchair (manual), Shower bench, and bed side commode  OCCUPATION: Retired in community education officer   PLOF: Independent  PATIENT GOALS:To use my arm with out limitation   OBJECTIVE:  Note: Objective measures were completed at Evaluation unless otherwise noted. PATIENT SURVEYS:  FOTO 32%  predicted 54% FOTO 05/21/23 50%  FOTO 06/29/23 63    POSTURE: Rounded shoulders  Right shoulder more forward than Left  UPPER EXTREMITY ROM:   Active ROM Right eval Left eval Left 05/08/23 Left 05/10/2023 Left 05/16/2023 Left 05/29/23 Left 06/12/23 06/14/23 06/25/23 06/29/23  Shoulder flexion 131/ PROM 145 NT to surgery 04-16-23 PROM 85 100 PROM 120 PROM P: 132 deg painless A: 106 deg (standing)  A: 142 deg (supine) AA: supine, 144 deg A: 130deg seated, painless A: 134 deg  P: 145 deg  Shoulder extension 125/ PROM  142           Shoulder abduction   PROM 60   P: 102 deg mild transient pain    A: 90 deg  Shoulder adduction            Shoulder internal rotation            Shoulder external rotation   PROM 20 30 PROM      A: 55 deg with mild pain (supine arm at side, propped on towel)  Elbow flexion            Elbow extension            Wrist flexion            Wrist extension            Wrist ulnar deviation            Wrist radial deviation            Wrist pronation            Wrist supination            (Blank rows = not tested)  UPPER EXTREMITY MMT:not formally assess at eval d/t ROM deficits and post-op status   MMT Right eval Left eval  Shoulder flexion  NT due to recent TSA surgery < 2 weeks  Shoulder extension    Shoulder abduction    Shoulder adduction    Shoulder internal rotation    Shoulder external rotation    Middle trapezius    Lower trapezius    Elbow flexion    Elbow extension    Wrist flexion    Wrist extension    Wrist ulnar deviation    Wrist radial deviation    Wrist pronation  Wrist supination    Grip strength (lbs)    (Blank rows = not tested)  SHOULDER SPECIAL TESTS: NT due to L TSA reverse  JOINT MOBILITY TESTING:  NT  PALPATION:  Pt with steri strips on surgical site well intact  06/14/23: pt with concordant tenderness L infraspinatus/teres muscle bellies. No bony tenderness scapular spine or acromion  MISCELLANEOUS:  06/18/23:  - R shoulder w/ no overt bony tenderness, does have some tenderness about bicipital groove but no apparent weakness at elbow or shoulder with active movement. Shoulder AROM appears stiff but not painful and pt states comparable to her normal - L shoulder w/ PROM comparable to prior session, no increased tenderness compared to prior sessions    TODAY'S TREATMENT [facility] Adult PT Treatment:                                                DATE: 06/29/23 Therapeutic Exercise: Wall slides 2x10 Red band row x10 Green band row  x10 HEP update + education/handout  Manual Therapy: PROM L GH flex/abd/scaption  Therapeutic Activity: MSK assessment + education FOTO + education Education/discussion re: progress with PT, symptom behavior as it affects activity tolerance, PT goals/POC     [facility] Adult PT Treatment:                                                DATE: 06/25/23 Therapeutic Exercise: Supine punch x15 Supine long lever shoulder flexion AROM x15 Seated shoulder flexion AROM <90 deg 2x5 Standing flexion AAROM swiss ball at table x10 Standing abduction AAROM swiss ball x10 LUE Red band row 2x8 cues for form and comfortable ROM, elbow mechanics Yellow band shoulder extension x5 cues for posture, shoulder/elbow positioning, appropriate ROM and reduced compensations Sidelying L GH AROM x8 cues for shoulder/elbow positioning   Manual Therapy: Supine PROM to pt tolerance flex/abd/scaption/ER    [facility] Adult PT Treatment:                                                DATE: 06/18/23 Therapeutic Exercise: Supine short lever flexion AAROM 3x5 Seated shoulder flexion slides w/ towel and slideboard 3x8 cues for comfortable ROM Scaption slideboard 3x8 cues for comfortable ROM Verbal HEP review/education  Manual Therapy: PROM shoulder flex/abd/scap/ER to pt tolerance  Therapeutic Activity: MSK assessment + education, communication w/ provider as needed, monitoring for adverse symptoms   PATIENT EDUCATION: Education details: rationale for interventions, HEP, progress w/ PT thus far, PT goals/POC Person educated: Patient Education method: Explanation, Demonstration, Tactile cues, Verbal cues Education comprehension: verbalized understanding, returned demonstration, verbal cues required, tactile cues required, and needs further education     HOME EXERCISE PROGRAM: Access Code: [email or web address] URL: [email or web address] Date: 06/29/2023 Prepared by: Alm Jenny  Program Notes - with sidelying  external rotation, please use towel tucked for elbow support and only go to neutral, as done in clinic  Exercises - Isometric Shoulder Abduction at Wall  - 2-3 x daily - 1 sets - 10 reps - 5 seconds hold - Isometric Shoulder Extension at Wall  - 2-3 x daily -  10 reps - 5 seconds hold - Isometric Shoulder Flexion at Wall  - 2-3 x daily - 1 sets - 10 reps - 5 seconds hold - Sidelying Shoulder Abduction  - 2-3 x daily - 1 sets - 10 reps - Supine Shoulder Flexion AAROM with Hands Clasped  - 2-3 x daily - 1 sets - 8 reps - Supine Shoulder Flexion Extension Full Range AROM  - 2-3 x daily - 1 sets - 8 reps - Sidelying Shoulder External Rotation  - 2-3 x daily - 1 sets - 8 reps - Seated Shoulder Flexion  - 2-3 x daily - 1 sets - 8 reps - Standing Shoulder Row with Anchored Resistance  - 2-3 x weekly - 2-3 sets - 10 reps - red band hold   ASSESSMENT: CLINICAL IMPRESSION: 06/29/2023 Pt arrives w/o pain, overall endorsing continued steady progress. Primary limitation at this point is weakness/heaviness of arm per pt report, still unable to return to heavier activities as expected ~10 weeks post op. Today able to progress well with volume for AAROM against gravity and resistance for periscapular strengthening. At present recommend extension of POC to progress appropriately along post operative healing timeline with gradual progression to more strength-based activities, with aim of returning to PLOF and improving activity tolerance. No adverse events, pt departs without any pain. Pt departs today's session in no acute distress, all voiced questions/concerns addressed appropriately from PT perspective.     EVAL: Patient is a 63 y.o. female who was seen today for physical therapy evaluation and treatment for 2 weeks s/p reverse total shoulder arthroplasty. L TSA on 04/16/23 by Dr Cordella Hutchinson. Pt demonstrates decreased Left shoulder AROM, strength and also some decreased AROM of R shoulder from previous surgery.  She does not complain of pain on entering clinic but does have discomfort and  pain and difficulty with ADLs/IADLs, driving, sleep quality, and participation in normal daily activities especially yard work which she desires ot return.. She requires skilled PT services at this time to address all relevant deficits and return to PLOF   OBJECTIVE IMPAIRMENTS: decreased activity tolerance, decreased endurance, decreased ROM, decreased strength, impaired UE functional use, postural dysfunction, and pain.   ACTIVITY LIMITATIONS: carrying, lifting, sleeping, bathing, dressing, reach over head, and hygiene/grooming  PARTICIPATION LIMITATIONS: meal prep, cleaning, laundry, driving, shopping, community activity, and yard work  PERSONAL FACTORS: allergies, asthma, anemia, fibromyalgia, GERD, HLD, HTN, hypothyroidism, R TSA, bil TKA, basal cell face CA, R RTC before R TSA, bunion on R foot, hernia repair are also affecting patient's functional outcome.    GOALS: Goals reviewed with patient? Yes  SHORT TERM GOALS: Target date: 05-31-23  Pt will be independent with initial HEP Baseline: 05/31/23: pt reports good performance of HEP overall Goal status: MET  2.  Pt pain level will not be greater than 4/10 with active motion Baseline: Pt post surgery 2 weeks, only isometric ex 05/31/23: no pain with movement, just heaviness Goal status: MET  3.  Pt will achieve 90 degrees flexion  AROM Baseline: NT due to post surgery eval 05/31/23: able to achieve >90 deg gravity eliminated AROM 06/29/23: see ROM chart above  Goal status: MET  LONG TERM GOALS: Target date: 08/10/2023  (updated 06/29/23)  Pt will be independent with advanced HEP Baseline: needs reinforcement has had R TSR reverse 06/29/23: reports good HEP adherence  Goal status: ONGOING  2.  FOTO will improve from 32%  to   54% indicating improved functional mobility.  Baseline: eval  32% 06/29/23: 63  Goal status: MET  3.  Left shoulder IR and ER  will return to [facility] to return to 2/10 or less NPS for as dressing and grooming Baseline: Pt unable to dress without compensation and limited motion 06/29/23: reports return to basic ADLs without issue, does demo limitations in ROM as above with mild pain Goal status: ONGOING  4.  Pt will be able to sleep on Left side without awaking due to pain Baseline: Pt post surgery 04-16-23 avoided lying on left side but now initial discomfort with lying on left side  06/29/23: sleeping on L shoulder without issue  Goal status: MET  5.  Left  shoulder AROM scaption will improve to minimally 0-140 degrees for improved overhead reaching Baseline: SEE AROM chart 06/29/23: see ROM chart above  Goal status: NEARLY MET  6.  Patient will demonstrate ability to push/pull at least 40# with <3/10 pain in order to return to heavy household and yardwork tasks.  Baseline: post op 04-16-23, only light weight such as coffee cup 06/29/23: not yet tested given proximity to surgery  Goal status: ONGOING   PLAN: PT FREQUENCY: 1-2x/week  PT DURATION: 6 weeks  PLANNED INTERVENTIONS: 97164- PT Re-evaluation, 97110-Therapeutic exercises, 97530- Therapeutic activity, 97112- Neuromuscular re-education, 97535- Self Care, [id number]- Manual therapy, 97014- Electrical stimulation (unattended), [id number]- Electrical stimulation (manual), Patient/Family education, Taping, Dry Needling, Joint mobilization, Cryotherapy, and Moist heat  PLAN FOR NEXT SESSION: continue with RC/deltoid isometrics. Periscapular strengthening. AAROM/AROM against gravity as tolerated.    Alm DELENA Jenny PT, DPT 06/29/2023 1:35 PM

## 2023-06-29 ENCOUNTER — Encounter: Payer: Self-pay | Admitting: Physical Therapy

## 2023-06-29 ENCOUNTER — Ambulatory Visit: Payer: Medicaid Other | Attending: Surgical | Admitting: Physical Therapy

## 2023-06-29 DIAGNOSIS — M25512 Pain in left shoulder: Secondary | ICD-10-CM | POA: Insufficient documentation

## 2023-06-29 DIAGNOSIS — M6281 Muscle weakness (generalized): Secondary | ICD-10-CM | POA: Diagnosis present

## 2023-07-03 ENCOUNTER — Ambulatory Visit: Payer: Medicaid Other | Admitting: Physical Therapy

## 2023-07-04 ENCOUNTER — Ambulatory Visit: Payer: Medicaid Other | Admitting: Family Medicine

## 2023-07-04 NOTE — Therapy (Signed)
 OUTPATIENT PHYSICAL THERAPY TREATMENT NOTE   Patient Name: Denise Morales MRN: [id number] DOB:November 25, 1960, 63 y.o., female Today's Date: 07/05/2023  END OF SESSION:  PT End of Session - 07/05/23 1103     Visit Number 17    Number of Visits 28    Date for PT Re-Evaluation 08/10/23    Authorization Type [employer]    Authorization Time Period Approved 4 visits 06/29/23-07/28/23    Authorization - Visit Number 1    Authorization - Number of Visits 4    PT Start Time 1103    PT Stop Time 1145    PT Time Calculation (min) 42 min    Activity Tolerance Patient tolerated treatment well                Past Medical History:  Diagnosis Date   Allergy    Anemia    Anxiety    Arthritis    Depression    Fibromyalgia    GERD (gastroesophageal reflux disease)    Heart murmur    History of hiatal hernia    Hyperlipidemia    Hypertension    Hypothyroidism    Melanoma (HCC)    face   Past Surgical History:  Procedure Laterality Date   ABDOMINAL HYSTERECTOMY  1985   AIKEN OSTEOTOMY Right 06/05/2022   Procedure: KATRINA OSTEOTOMY;  Surgeon: Tobie Franky SQUIBB, DPM;  Location: [facility];  Service: Podiatry;  Laterality: Right;   BICEPT TENODESIS Right 12/05/2022   Procedure: RIGHT BICEPS TENODESIS;  Surgeon: Addie Cordella Hamilton, MD;  Location: [facility] OR;  Service: Orthopedics;  Laterality: Right;   CAPSULOTOMY METATARSOPHALANGEAL Right 06/05/2022   Procedure: CAPSULOTOMY METATARSOPHALANGEAL;  Surgeon: Tobie Franky SQUIBB, DPM;  Location: [facility];  Service: Podiatry;  Laterality: Right;   CHOLECYSTECTOMY     COLONOSCOPY     HALLUX VALGUS LAPIDUS Right 06/05/2022   Procedure: HALLUX VALGUS LAPIDUS;  Surgeon: Tobie Franky SQUIBB, DPM;  Location: [facility];  Service: Podiatry;  Laterality: Right;   HAMMER TOE SURGERY Right 06/05/2022   Procedure: HAMMER TOE CORRECTION;  Surgeon: Tobie Franky SQUIBB, DPM;  Location: [facility];   Service: Podiatry;  Laterality: Right;   REVERSE SHOULDER ARTHROPLASTY Right 12/05/2022   Procedure: RIGHT REVERSE SHOULDER ARTHROPLASTY;  Surgeon: Addie Cordella Hamilton, MD;  Location: [facility] OR;  Service: Orthopedics;  Laterality: Right;   REVERSE SHOULDER ARTHROPLASTY Left 04/16/2023   Procedure: REVERSE SHOULDER ARTHROPLASTY;  Surgeon: Addie Cordella Hamilton, MD;  Location: [facility] OR;  Service: Orthopedics;  Laterality: Left;   SHOULDER ARTHROSCOPY WITH ROTATOR CUFF REPAIR AND SUBACROMIAL DECOMPRESSION Right 08/12/2020   Procedure: RIGHT SHOULDER ARTHROSCOPY WITH ROTATOR CUFF REPAIR AND SUBACROMIAL DECOMPRESSION;  Surgeon: Vernetta Lonni GRADE, MD;  Location: [facility];  Service: Orthopedics;  Laterality: Right;   TOTAL KNEE ARTHROPLASTY Right 12/24/2020   Procedure: RIGHT TOTAL KNEE ARTHROPLASTY;  Surgeon: Vernetta Lonni GRADE, MD;  Location: [facility];  Service: Orthopedics;  Laterality: Right;   TOTAL KNEE ARTHROPLASTY Left 07/15/2021   Procedure: LEFT TOTAL KNEE ARTHROPLASTY;  Surgeon: Vernetta Lonni GRADE, MD;  Location: [facility];  Service: Orthopedics;  Laterality: Left;   XI ROBOTIC ASSISTED PARAESOPHAGEAL HERNIA REPAIR N/A 03/12/2023   Procedure: XI ROBOTIC ASSISTED PARAESOPHAGEAL HERNIA REPAIR WITH FUNDOPLICATION;  Surgeon: Signe Mitzie LABOR, MD;  Location: [facility];  Service: General;  Laterality: N/A;  180   Patient Active Problem List   Diagnosis Date Noted   Arthritis of left shoulder region 04/24/2023  Biceps tendonitis, left 04/24/2023   S/P reverse total shoulder arthroplasty, left 04/16/2023   Depression, recurrent (HCC) 04/03/2023   S/P repair of paraesophageal hernia 03/12/2023   Arthritis of right shoulder region 12/24/2022   Biceps tendonitis on right 12/24/2022   S/P reverse total shoulder arthroplasty, right 12/05/2022   Vertigo 11/27/2022   Iron  deficiency anemia 10/30/2022   Physical exam, annual 10/30/2022   Medication management 10/30/2022   Class 2  severe obesity due to excess calories with serious comorbidity in adult, unspecified BMI (HCC) 10/23/2022   Essential hypertension 10/23/2022   Prediabetes 10/23/2022   Hypothyroidism 10/23/2022   Insomnia 06/14/2022   Status post total left knee replacement 07/15/2021   Neoplasm of uncertain behavior of skin 05/25/2021   Generalized osteoarthritis of multiple sites 05/25/2021   Fibromyalgia syndrome 05/25/2021   Restless legs 03/16/2021   Hyperlipidemia 03/16/2021   Anxiety 03/16/2021   Status post right knee replacement 12/24/2020   Unilateral primary osteoarthritis, left knee 11/11/2020   Unilateral primary osteoarthritis, right knee 11/11/2020   Complete tear of right rotator cuff 08/12/2020   Hypertensive disorder 05/05/2019    PCP: Kayla Jeoffrey RAMAN, FNP   REFERRING PROVIDER: Shirly Carlin CROME, PA-C   REFERRING DIAG: [phone] (ICD-10-CM) - History of arthroplasty of left shoulder   THERAPY DIAG:  Acute pain of left shoulder  Muscle weakness (generalized)  Rationale for Evaluation and Treatment: Rehabilitation  ONSET DATE: L reverse TSA on 04/16/23   SUBJECTIVE:                                                                                                                                                                                     SUBJECTIVE STATEMENT:  07/05/2023 Pt states she did have some aching yesterday, states she was pretty active. Feels good today, no pain. No issues after last session. Heaviness getting better.   EVAL: I have been using CPM machine and doing light activities in the home like washing cups and not trying to carry anything heavy, L TSA on 10/21/ 24 by Dr Addie.  MD as DC sling. I feel like it is a toothache pain last night. I would like to be able to return to yard work and my mud room. I am having to maneuver clothes so I can try to dress myself.  I have been here before with Lucie last time  PERTINENT HISTORY: allergies, asthma, anemia,  fibromyalgia, GERD, HLD, HTN, hypothyroidism, R TSA, bil TKA, basal cell face CA, R RTC before R TSA, bunion on R foot, hernia repair,    PAIN:  Are you having pain? NPRS scale: 0/10 Pain location: Left shoulder Pain description: Achy Aggravating  factors: reaching, lifting Relieving factors: Resting   PRECAUTIONS: Per 05/28/23 Dr. Addie note: plan at this time is continue with strengthening plus home exercise program  RED FLAGS: None   WEIGHT BEARING RESTRICTIONS: Yes No weight bearing until released for AROM next visit  FALLS:  Has patient fallen in last 6 months?   YES  slipped on deck steps but no issues other than that time.  LIVING ENVIRONMENT: Lives with: lives with their family and including dtr SIL and grandkids Lives in: House/apartment Stairs: Yes: External: 5 steps; can reach both Has following equipment at home: Single point cane, Walker - 2 wheeled, Wheelchair (manual), Shower bench, and bed side commode  OCCUPATION: Retired in community education officer   PLOF: Independent  PATIENT GOALS:To use my arm with out limitation   OBJECTIVE:  Note: Objective measures were completed at Evaluation unless otherwise noted. PATIENT SURVEYS:  FOTO 32%  predicted 54% FOTO 05/21/23 50%  FOTO 06/29/23 63    POSTURE: Rounded shoulders  Right shoulder more forward than Left  UPPER EXTREMITY ROM:   Active ROM Right eval Left eval Left 05/08/23 Left 05/10/2023 Left 05/16/2023 Left 05/29/23 Left 06/12/23 06/14/23 06/25/23 06/29/23  Shoulder flexion 131/ PROM 145 NT to surgery 04-16-23 PROM 85 100 PROM 120 PROM P: 132 deg painless A: 106 deg (standing)  A: 142 deg (supine) AA: supine, 144 deg A: 130deg seated, painless A: 134 deg  P: 145 deg  Shoulder extension 125/ PROM 142           Shoulder abduction   PROM 60   P: 102 deg mild transient pain    A: 90 deg  Shoulder adduction            Shoulder internal rotation            Shoulder external rotation   PROM 20 30 PROM      A: 55 deg  with mild pain (supine arm at side, propped on towel)  Elbow flexion            Elbow extension            Wrist flexion            Wrist extension            Wrist ulnar deviation            Wrist radial deviation            Wrist pronation            Wrist supination            (Blank rows = not tested)  UPPER EXTREMITY MMT:not formally assess at eval d/t ROM deficits and post-op status   MMT Right eval Left eval  Shoulder flexion  NT due to recent TSA surgery < 2 weeks  Shoulder extension    Shoulder abduction    Shoulder adduction    Shoulder internal rotation    Shoulder external rotation    Middle trapezius    Lower trapezius    Elbow flexion    Elbow extension    Wrist flexion    Wrist extension    Wrist ulnar deviation    Wrist radial deviation    Wrist pronation    Wrist supination    Grip strength (lbs)    (Blank rows = not tested)  SHOULDER SPECIAL TESTS: NT due to L TSA reverse  JOINT MOBILITY TESTING:  NT  PALPATION:  Pt with steri strips on surgical site  well intact  06/14/23: pt with concordant tenderness L infraspinatus/teres muscle bellies. No bony tenderness scapular spine or acromion  MISCELLANEOUS:  06/18/23:  - R shoulder w/ no overt bony tenderness, does have some tenderness about bicipital groove but no apparent weakness at elbow or shoulder with active movement. Shoulder AROM appears stiff but not painful and pt states comparable to her normal - L shoulder w/ PROM comparable to prior session, no increased tenderness compared to prior sessions    TODAY'S TREATMENT [facility] Adult PT Treatment:                                                DATE: 07/05/23 Therapeutic Exercise: Green band row 3x10 cues for form  Yellow band shoulder ext to neutral 2x8 cues for form and appropriate ROM  BIL shoulder flex/scap/abduction 3 way unweighted to 90 deg, x5 cues for scapular mechanics and pacing Sidelying ER x10, x15 L GH cues for positioning Verbal HEP  review  Manual Therapy: PROM all directions to pt tolerance Rhythmic stabilizations 3x30sec long lever low amplitude    [facility] Adult PT Treatment:                                                DATE: 06/29/23 Therapeutic Exercise: Wall slides 2x10 Red band row x10 Green band row x10 HEP update + education/handout  Manual Therapy: PROM L GH flex/abd/scaption  Therapeutic Activity: MSK assessment + education FOTO + education Education/discussion re: progress with PT, symptom behavior as it affects activity tolerance, PT goals/POC     [facility] Adult PT Treatment:                                                DATE: 06/25/23 Therapeutic Exercise: Supine punch x15 Supine long lever shoulder flexion AROM x15 Seated shoulder flexion AROM <90 deg 2x5 Standing flexion AAROM swiss ball at table x10 Standing abduction AAROM swiss ball x10 LUE Red band row 2x8 cues for form and comfortable ROM, elbow mechanics Yellow band shoulder extension x5 cues for posture, shoulder/elbow positioning, appropriate ROM and reduced compensations Sidelying L GH AROM x8 cues for shoulder/elbow positioning   Manual Therapy: Supine PROM to pt tolerance flex/abd/scaption/ER   PATIENT EDUCATION: Education details: rationale for interventions, HEP  Person educated: Patient Education method: Explanation, Demonstration, Tactile cues, Verbal cues Education comprehension: verbalized understanding, returned demonstration, verbal cues required, tactile cues required, and needs further education     HOME EXERCISE PROGRAM: Access Code: [email or web address] URL: [email or web address] Date: 06/29/2023 Prepared by: Alm Jenny  Program Notes - with sidelying external rotation, please use towel tucked for elbow support and only go to neutral, as done in clinic  Exercises - Isometric Shoulder Abduction at Wall  - 2-3 x daily - 1 sets - 10 reps - 5 seconds hold - Isometric Shoulder Extension at Wall  - 2-3 x  daily - 10 reps - 5 seconds hold - Isometric Shoulder Flexion at Wall  - 2-3 x daily - 1 sets - 10 reps - 5 seconds hold - Sidelying Shoulder Abduction  - 2-3 x daily -  1 sets - 10 reps - Supine Shoulder Flexion AAROM with Hands Clasped  - 2-3 x daily - 1 sets - 8 reps - Supine Shoulder Flexion Extension Full Range AROM  - 2-3 x daily - 1 sets - 8 reps - Sidelying Shoulder External Rotation  - 2-3 x daily - 1 sets - 8 reps - Seated Shoulder Flexion  - 2-3 x daily - 1 sets - 8 reps - Standing Shoulder Row with Anchored Resistance  - 2-3 x weekly - 2-3 sets - 10 reps - red band hold   ASSESSMENT: CLINICAL IMPRESSION: 07/05/2023 Pt arrives w/o pain, endorses improving heaviness and no issues after last session. Today adding rhythmic stabilizations at ~ 90 deg elevation which pt does well with. Also progressing AROM against gravity and periscapular strengthening. No adverse events, pt tolerates well without increase in resting pain, primary report of muscular fatigue. Recommend continuing along current POC in order to address relevant deficits and improve functional tolerance. Pt departs today's session in no acute distress, all voiced questions/concerns addressed appropriately from PT perspective.      EVAL: Patient is a 63 y.o. female who was seen today for physical therapy evaluation and treatment for 2 weeks s/p reverse total shoulder arthroplasty. L TSA on 04/16/23 by Dr Cordella Hutchinson. Pt demonstrates decreased Left shoulder AROM, strength and also some decreased AROM of R shoulder from previous surgery. She does not complain of pain on entering clinic but does have discomfort and  pain and difficulty with ADLs/IADLs, driving, sleep quality, and participation in normal daily activities especially yard work which she desires ot return.. She requires skilled PT services at this time to address all relevant deficits and return to PLOF   OBJECTIVE IMPAIRMENTS: decreased activity tolerance, decreased  endurance, decreased ROM, decreased strength, impaired UE functional use, postural dysfunction, and pain.   ACTIVITY LIMITATIONS: carrying, lifting, sleeping, bathing, dressing, reach over head, and hygiene/grooming  PARTICIPATION LIMITATIONS: meal prep, cleaning, laundry, driving, shopping, community activity, and yard work  PERSONAL FACTORS: allergies, asthma, anemia, fibromyalgia, GERD, HLD, HTN, hypothyroidism, R TSA, bil TKA, basal cell face CA, R RTC before R TSA, bunion on R foot, hernia repair are also affecting patient's functional outcome.    GOALS: Goals reviewed with patient? Yes  SHORT TERM GOALS: Target date: 05-31-23  Pt will be independent with initial HEP Baseline: 05/31/23: pt reports good performance of HEP overall Goal status: MET  2.  Pt pain level will not be greater than 4/10 with active motion Baseline: Pt post surgery 2 weeks, only isometric ex 05/31/23: no pain with movement, just heaviness Goal status: MET  3.  Pt will achieve 90 degrees flexion  AROM Baseline: NT due to post surgery eval 05/31/23: able to achieve >90 deg gravity eliminated AROM 06/29/23: see ROM chart above  Goal status: MET  LONG TERM GOALS: Target date: 08/10/2023  (updated 06/29/23)  Pt will be independent with advanced HEP Baseline: needs reinforcement has had R TSR reverse 06/29/23: reports good HEP adherence  Goal status: ONGOING  2.  FOTO will improve from 32%  to   54% indicating improved functional mobility.  Baseline: eval 32% 06/29/23: 63  Goal status: MET  3.  Left shoulder IR and ER will return to [facility] to return to 2/10 or less NPS for as dressing and grooming Baseline: Pt unable to dress without compensation and limited motion 06/29/23: reports return to basic ADLs without issue, does demo limitations in ROM as above with mild pain  Goal status: ONGOING  4.  Pt will be able to sleep on Left side without awaking due to pain Baseline: Pt post surgery 04-16-23 avoided lying on  left side but now initial discomfort with lying on left side  06/29/23: sleeping on L shoulder without issue  Goal status: MET  5.  Left  shoulder AROM scaption will improve to minimally 0-140 degrees for improved overhead reaching Baseline: SEE AROM chart 06/29/23: see ROM chart above  Goal status: NEARLY MET  6.  Patient will demonstrate ability to push/pull at least 40# with <3/10 pain in order to return to heavy household and yardwork tasks.  Baseline: post op 04-16-23, only light weight such as coffee cup 06/29/23: not yet tested given proximity to surgery  Goal status: ONGOING   PLAN: PT FREQUENCY: 1-2x/week  PT DURATION: 6 weeks  PLANNED INTERVENTIONS: 97164- PT Re-evaluation, 97110-Therapeutic exercises, 97530- Therapeutic activity, 97112- Neuromuscular re-education, 97535- Self Care, 02859- Manual therapy, 97014- Electrical stimulation (unattended), [id number]- Electrical stimulation (manual), Patient/Family education, Taping, Dry Needling, Joint mobilization, Cryotherapy, and Moist heat  PLAN FOR NEXT SESSION: continue with RC/deltoid isometrics. Periscapular strengthening. AAROM/AROM against gravity as tolerated.    Alm DELENA Jenny PT, DPT 07/05/2023 11:54 AM

## 2023-07-05 ENCOUNTER — Ambulatory Visit: Payer: Medicaid Other | Admitting: Physical Therapy

## 2023-07-05 ENCOUNTER — Encounter: Payer: Self-pay | Admitting: Physical Therapy

## 2023-07-05 DIAGNOSIS — M6281 Muscle weakness (generalized): Secondary | ICD-10-CM

## 2023-07-05 DIAGNOSIS — M25512 Pain in left shoulder: Secondary | ICD-10-CM | POA: Diagnosis not present

## 2023-07-09 ENCOUNTER — Ambulatory Visit: Payer: Medicaid Other | Admitting: Family Medicine

## 2023-07-09 ENCOUNTER — Encounter: Payer: Self-pay | Admitting: Family Medicine

## 2023-07-09 ENCOUNTER — Other Ambulatory Visit: Payer: Self-pay | Admitting: Family Medicine

## 2023-07-09 VITALS — BP 130/90 | HR 81 | Temp 97.6°F | Ht 59.0 in | Wt 174.0 lb

## 2023-07-09 DIAGNOSIS — D508 Other iron deficiency anemias: Secondary | ICD-10-CM | POA: Diagnosis not present

## 2023-07-09 DIAGNOSIS — E66812 Obesity, class 2: Secondary | ICD-10-CM | POA: Diagnosis not present

## 2023-07-09 DIAGNOSIS — F339 Major depressive disorder, recurrent, unspecified: Secondary | ICD-10-CM

## 2023-07-09 DIAGNOSIS — I10 Essential (primary) hypertension: Secondary | ICD-10-CM

## 2023-07-09 DIAGNOSIS — Z6835 Body mass index (BMI) 35.0-35.9, adult: Secondary | ICD-10-CM

## 2023-07-09 MED ORDER — BUSPIRONE HCL 7.5 MG PO TABS
7.5000 mg | ORAL_TABLET | Freq: Two times a day (BID) | ORAL | 1 refills | Status: DC
Start: 2023-07-09 — End: 2023-08-30

## 2023-07-09 NOTE — Assessment & Plan Note (Signed)
 Elevated today in office 130/90. She will monitor at home and report to office if sustains >130/90. Continue Lisinopril -hydrochlorothiazide  10-12.5mg  daily. Recommend heart healthy diet such as Mediterranean diet with whole grains, fruits, vegetable, fish, lean meats, nuts, and olive oil. Limit salt. Encouraged moderate walking, 3-5 times/week for 30-50 minutes each session. Aim for at least 150 minutes.week. Goal should be pace of 3 miles/hours, or walking 1.5 miles in 30 minutes. Avoid tobacco products. Avoid excess alcohol. Take medications as prescribed and bring medications and blood pressure log with cuff to each office visit. Seek medical care for chest pain, palpitations, shortness of breath with exertion, dizziness/lightheadedness, vision changes, recurrent headaches, or swelling of extremities. Follow up in 3 months or sooner if needed.

## 2023-07-09 NOTE — Patient Instructions (Addendum)
 Walk at [facility]

## 2023-07-09 NOTE — Assessment & Plan Note (Signed)
 She would like to work on [employer]. Discussed importance of weight management for overall health. Encouraged low calorie, heart healthy diet and moderate intensity exercise 150 minutes weekly. This is 3-5 times weekly for 30-50 minutes each session. Goal should be pace of 3 miles/hours, or walking 1.5 miles in 30 minutes and include strength training. Discussed risks of obesity. Will refer to [facility].

## 2023-07-09 NOTE — Assessment & Plan Note (Signed)
 Chronic uncontrolled. She has established with a therapist. Continue Lexapro 20mg  daily and start Buspar 7.5mg  BID. If not improved return to office in 4 weeks or sooner if needed. Denies SI/HI.

## 2023-07-09 NOTE — Assessment & Plan Note (Signed)
 Recheck CBC.

## 2023-07-09 NOTE — Progress Notes (Signed)
 Subjective:  HPI: Denise Morales is a 63 y.o. female presenting on 07/09/2023 for No chief complaint on file.   HPI Patient is in today for follow up for hypertension and depression. She does monitor her BP at home rarely and reports readings <140/90. Today her BP is elevated in office, she is also tearful when discussing her mental health today.   HYPERTENSION without Chronic Kidney Disease Hypertension status: exacerbated  Satisfied with current treatment? yes Duration of hypertension: chronic BP monitoring frequency:  rarely BP range:  BP medication side effects:  no Medication compliance: excellent compliance Previous BP meds:lisinopril -HCTZ Aspirin : no Recurrent headaches: no Visual changes: no Palpitations: no Dyspnea: no Chest pain: no Lower extremity edema: no Dizzy/lightheaded: no     07/09/2023   11:47 AM 06/11/2023    8:11 AM 10/23/2022    3:17 PM 12/01/2020    2:49 PM 08/23/2020   10:31 AM  Depression screen PHQ 2/9  Decreased Interest 3  2 0 0  Down, Depressed, Hopeless 1  0 1 1  PHQ - 2 Score 4  2 1 1   Altered sleeping 3  1    Tired, decreased energy 0  3    Change in appetite 0  0    Feeling bad or failure about yourself  3  0    Trouble concentrating 0  0    Moving slowly or fidgety/restless 3  0    Suicidal thoughts 0  0    PHQ-9 Score 13  6    Difficult doing work/chores Somewhat difficult  Somewhat difficult       Information is confidential and restricted. Go to Review Flowsheets to unlock data.     Review of Systems  All other systems reviewed and are negative.   Relevant past medical history reviewed and updated as indicated.   Past Medical History:  Diagnosis Date   Allergy    Anemia    Anxiety    Arthritis    Depression    Fibromyalgia    GERD (gastroesophageal reflux disease)    Heart murmur    History of hiatal hernia    Hyperlipidemia    Hypertension    Hypothyroidism    Melanoma [facility])    face     Past Surgical  History:  Procedure Laterality Date   ABDOMINAL HYSTERECTOMY  1985   AIKEN OSTEOTOMY Right 06/05/2022   Procedure: KATRINA OSTEOTOMY;  Surgeon: Tobie Franky SQUIBB, DPM;  Location: [facility] [place];  Service: Podiatry;  Laterality: Right;   BICEPT TENODESIS Right 12/05/2022   Procedure: RIGHT BICEPS TENODESIS;  Surgeon: Addie Cordella Hamilton, MD;  Location: [facility] OR;  Service: Orthopedics;  Laterality: Right;   CAPSULOTOMY METATARSOPHALANGEAL Right 06/05/2022   Procedure: CAPSULOTOMY METATARSOPHALANGEAL;  Surgeon: Tobie Franky SQUIBB, DPM;  Location: [facility] [place];  Service: Podiatry;  Laterality: Right;   CHOLECYSTECTOMY     COLONOSCOPY     HALLUX VALGUS LAPIDUS Right 06/05/2022   Procedure: HALLUX VALGUS LAPIDUS;  Surgeon: Tobie Franky SQUIBB, DPM;  Location: [facility];  Service: Podiatry;  Laterality: Right;   HAMMER TOE SURGERY Right 06/05/2022   Procedure: HAMMER TOE CORRECTION;  Surgeon: Tobie Franky SQUIBB, DPM;  Location: [facility] [place];  Service: Podiatry;  Laterality: Right;   REVERSE SHOULDER ARTHROPLASTY Right 12/05/2022   Procedure: RIGHT REVERSE SHOULDER ARTHROPLASTY;  Surgeon: Addie Cordella Hamilton, MD;  Location: [facility] OR;  Service: Orthopedics;  Laterality: Right;   REVERSE SHOULDER ARTHROPLASTY Left 04/16/2023  Procedure: REVERSE SHOULDER ARTHROPLASTY;  Surgeon: Addie Cordella Hamilton, MD;  Location: [facility] OR;  Service: Orthopedics;  Laterality: Left;   SHOULDER ARTHROSCOPY WITH ROTATOR CUFF REPAIR AND SUBACROMIAL DECOMPRESSION Right 08/12/2020   Procedure: RIGHT SHOULDER ARTHROSCOPY WITH ROTATOR CUFF REPAIR AND SUBACROMIAL DECOMPRESSION;  Surgeon: Vernetta Lonni GRADE, MD;  Location: [facility];  Service: Orthopedics;  Laterality: Right;   TOTAL KNEE ARTHROPLASTY Right 12/24/2020   Procedure: RIGHT TOTAL KNEE ARTHROPLASTY;  Surgeon: Vernetta Lonni GRADE, MD;  Location: [facility];  Service: Orthopedics;  Laterality: Right;   TOTAL KNEE  ARTHROPLASTY Left 07/15/2021   Procedure: LEFT TOTAL KNEE ARTHROPLASTY;  Surgeon: Vernetta Lonni GRADE, MD;  Location: [facility];  Service: Orthopedics;  Laterality: Left;   XI ROBOTIC ASSISTED PARAESOPHAGEAL HERNIA REPAIR N/A 03/12/2023   Procedure: XI ROBOTIC ASSISTED PARAESOPHAGEAL HERNIA REPAIR WITH FUNDOPLICATION;  Surgeon: Signe Mitzie LABOR, MD;  Location: [facility];  Service: General;  Laterality: N/A;  180    Allergies and medications reviewed and updated.   Current Outpatient Medications:    acetaminophen  (TYLENOL ) 500 MG tablet, Take 500-1,000 mg by mouth every 6 (six) hours as needed (pain)., Disp: , Rfl:    busPIRone  (BUSPAR ) 7.5 MG tablet, Take 1 tablet (7.5 mg total) by mouth 2 (two) times daily., Disp: 60 tablet, Rfl: 1   celecoxib  (CELEBREX ) 100 MG capsule, Take 1 capsule (100 mg total) by mouth 2 (two) times daily., Disp: 30 capsule, Rfl: 0   dicyclomine  (BENTYL ) 10 MG capsule, Take 10 mg by mouth 4 (four) times daily as needed for spasms., Disp: , Rfl:    escitalopram  (LEXAPRO ) 20 MG tablet, Take 1 tablet by mouth once daily, Disp: 90 tablet, Rfl: 0   levothyroxine  (SYNTHROID ) 50 MCG tablet, Take 1 tablet (50 mcg total) by mouth daily., Disp: 90 tablet, Rfl: 3   lisinopril -hydrochlorothiazide  (ZESTORETIC ) 10-12.5 MG tablet, Take 1 tablet by mouth once daily, Disp: 90 tablet, Rfl: 0   methocarbamol  (ROBAXIN ) 500 MG tablet, Take 1 tablet (500 mg total) by mouth every 8 (eight) hours as needed for muscle spasms., Disp: 30 tablet, Rfl: 0   pravastatin  (PRAVACHOL ) 40 MG tablet, Take 1 tablet (40 mg total) by mouth daily. (Patient taking differently: Take 40 mg by mouth at bedtime.), Disp: 90 tablet, Rfl: 1   QUEtiapine  (SEROQUEL ) 50 MG tablet, TAKE 1 TABLET BY MOUTH AT BEDTIME ON MONDAYS, WEDNESDAYS , FRIDAYS AND  SATURDAYS, Disp: 12 tablet, Rfl: 0   TURMERIC PO, Take 1,000 mg by mouth in the morning., Disp: , Rfl:    Vitamin D -Vitamin K (VITAMIN K2 -VITAMIN D3 PO), Take 1 tablet by mouth  in the morning. 100 mg/50 mcg, Disp: , Rfl:    zolpidem  (AMBIEN ) 5 MG tablet, TAKE 1 TABLET BY MOUTH EVERY OTHER NIGHT ALTERNATING WITH QUETIAPINE , Disp: 15 tablet, Rfl: 0  Allergies  Allergen Reactions   Hydroxyzine  Other (See Comments)    hallucinations    Objective:   BP (!) 130/90   Pulse 81   Temp 97.6 F (36.4 C) (Oral)   Ht 4' 11 (1.499 m)   Wt 174 lb (78.9 kg)   SpO2 98%   BMI 35.14 kg/m      07/09/2023   11:15 AM 04/17/2023    9:51 AM 04/17/2023    3:50 AM  Vitals with BMI  Height 4' 11    Weight 174 lbs    BMI 35.13    Systolic 130 100 877  Diastolic 90 61 74  Pulse 81 79  79     Physical Exam Vitals and nursing note reviewed.  Constitutional:      Appearance: Normal appearance. She is normal weight.  HENT:     Head: Normocephalic and atraumatic.  Cardiovascular:     Rate and Rhythm: Normal rate and regular rhythm.     Pulses: Normal pulses.     Heart sounds: Normal heart sounds.  Pulmonary:     Effort: Pulmonary effort is normal.     Breath sounds: Normal breath sounds.  Skin:    General: Skin is warm and dry.  Neurological:     General: No focal deficit present.     Mental Status: She is alert and oriented to person, place, and time. Mental status is at baseline.  Psychiatric:        Mood and Affect: Mood normal. Affect is tearful.        Behavior: Behavior normal.        Thought Content: Thought content normal.        Judgment: Judgment normal.     Assessment & Plan:  Essential hypertension Assessment & Plan: Elevated today in office 130/90. She will monitor at home and report to office if sustains >130/90. Continue Lisinopril -hydrochlorothiazide  10-12.5mg  daily. Recommend heart healthy diet such as Mediterranean diet with whole grains, fruits, vegetable, fish, lean meats, nuts, and olive oil. Limit salt. Encouraged moderate walking, 3-5 times/week for 30-50 minutes each session. Aim for at least 150 minutes.week. Goal should be pace of 3  miles/hours, or walking 1.5 miles in 30 minutes. Avoid tobacco products. Avoid excess alcohol. Take medications as prescribed and bring medications and blood pressure log with cuff to each office visit. Seek medical care for chest pain, palpitations, shortness of breath with exertion, dizziness/lightheadedness, vision changes, recurrent headaches, or swelling of extremities. Follow up in 3 months or sooner if needed.   Orders: -     COMPLETE METABOLIC PANEL WITH GFR -     Amb Ref to Medical Weight Management  Other iron  deficiency anemia Assessment & Plan: Recheck CBC  Orders: -     CBC with Differential/Platelet  Class 2 severe obesity due to excess calories with serious comorbidity in adult, unspecified BMI (HCC) Assessment & Plan: She would like to work on [employer]. Discussed importance of weight management for overall health. Encouraged low calorie, heart healthy diet and moderate intensity exercise 150 minutes weekly. This is 3-5 times weekly for 30-50 minutes each session. Goal should be pace of 3 miles/hours, or walking 1.5 miles in 30 minutes and include strength training. Discussed risks of obesity. Will refer to [facility].   Orders: -     COMPLETE METABOLIC PANEL WITH GFR -     Amb Ref to Medical Weight Management  Depression, recurrent (HCC) Assessment & Plan: Chronic uncontrolled. She has established with a therapist. Continue Lexapro  20mg  daily and start Buspar  7.5mg  BID. If not improved return to office in 4 weeks or sooner if needed. Denies SI/HI.    Other orders -     busPIRone  HCl; Take 1 tablet (7.5 mg total) by mouth 2 (two) times daily.  Dispense: 60 tablet; Refill: 1     Follow up plan: Return in about 4 weeks (around 08/06/2023) for anxiety/depression, hypertension.  Jeoffrey GORMAN Barrio, FNP

## 2023-07-10 ENCOUNTER — Ambulatory Visit (HOSPITAL_COMMUNITY): Payer: Medicaid Other | Admitting: Clinical

## 2023-07-10 DIAGNOSIS — F331 Major depressive disorder, recurrent, moderate: Secondary | ICD-10-CM | POA: Diagnosis not present

## 2023-07-10 LAB — CBC WITH DIFFERENTIAL/PLATELET
Absolute Lymphocytes: 3159 {cells}/uL (ref 850–3900)
Absolute Monocytes: 446 {cells}/uL (ref 200–950)
Basophils Absolute: 73 {cells}/uL (ref 0–200)
Basophils Relative: 0.9 %
Eosinophils Absolute: 259 {cells}/uL (ref 15–500)
Eosinophils Relative: 3.2 %
HCT: 42.8 % (ref 35.0–45.0)
Hemoglobin: 14.1 g/dL (ref 11.7–15.5)
MCH: 28.8 pg (ref 27.0–33.0)
MCHC: 32.9 g/dL (ref 32.0–36.0)
MCV: 87.3 fL (ref 80.0–100.0)
MPV: 10.5 fL (ref 7.5–12.5)
Monocytes Relative: 5.5 %
Neutro Abs: 4163 {cells}/uL (ref 1500–7800)
Neutrophils Relative %: 51.4 %
Platelets: 308 10*3/uL (ref 140–400)
RBC: 4.9 10*6/uL (ref 3.80–5.10)
RDW: 12.4 % (ref 11.0–15.0)
Total Lymphocyte: 39 %
WBC: 8.1 10*3/uL (ref 3.8–10.8)

## 2023-07-10 LAB — COMPLETE METABOLIC PANEL WITH GFR
AG Ratio: 1.9 (calc) (ref 1.0–2.5)
ALT: 9 U/L (ref 6–29)
AST: 12 U/L (ref 10–35)
Albumin: 4.7 g/dL (ref 3.6–5.1)
Alkaline phosphatase (APISO): 113 U/L (ref 37–153)
BUN: 15 mg/dL (ref 7–25)
CO2: 27 mmol/L (ref 20–32)
Calcium: 9.9 mg/dL (ref 8.6–10.4)
Chloride: 103 mmol/L (ref 98–110)
Creat: 0.96 mg/dL (ref 0.50–1.05)
Globulin: 2.5 g/dL (ref 1.9–3.7)
Glucose, Bld: 94 mg/dL (ref 65–99)
Potassium: 4.3 mmol/L (ref 3.5–5.3)
Sodium: 141 mmol/L (ref 135–146)
Total Bilirubin: 0.3 mg/dL (ref 0.2–1.2)
Total Protein: 7.2 g/dL (ref 6.1–8.1)
eGFR: 66 mL/min/{1.73_m2} (ref >=60)

## 2023-07-11 ENCOUNTER — Encounter: Payer: Self-pay | Admitting: Orthopedic Surgery

## 2023-07-11 ENCOUNTER — Ambulatory Visit: Payer: Medicaid Other | Admitting: Surgical

## 2023-07-11 DIAGNOSIS — Z96612 Presence of left artificial shoulder joint: Secondary | ICD-10-CM

## 2023-07-11 NOTE — Progress Notes (Signed)
Post-Op Visit Note   Patient: Denise Morales           Date of Birth: 1960/09/07           MRN: [id number] Visit Date: 07/11/2023 PCP: Park Meo, FNP   Assessment & Plan:  Chief Complaint:  Chief Complaint  Patient presents with   Left Shoulder - Follow-up   Visit Diagnoses: No diagnosis found.  Plan: Patient is a 63 year old female who presents s/p left shoulder reverse shoulder arthroplasty on 04/16/2023.  She is doing well overall.  She feels that she is steadily improving and the ability to lift in front of her that she was having some difficulty with at her last appointment as substantially improved.  She has not plateaued yet.  Still working with physical therapy and has about 4 more sessions over the next month.  Only main complaint is some continued nighttime pain and occasional cramping sensation in the lateral deltoid that lasts for a few minutes but this is only happened twice since surgery.  She denies any fevers or chills or any change in the appearance of the incision.  On exam, patient has 35 degrees X rotation, 90 degrees abduction, 145 degrees forward elevation passively and actively.  She has incision that is well-healed.  No warmth or sinus tract noted over the incision.  No cellulitis.  Intact EPL, FPL, finger abduction.  Axillary nerve is intact with deltoid firing.  Subscap strength rated 5/5.  Plan is to continue with physical therapy and home exercise program.  Follow-up with the office as needed if pain worsens or does not improve as she gets further out from surgery.  Rediscussed dental antibiotic prophylaxis.  She will follow-up with the office as needed.  Follow-Up Instructions: No follow-ups on file.   Orders:  No orders of the defined types were placed in this encounter.  No orders of the defined types were placed in this encounter.   Imaging: No results found.  PMFS History: Patient Active Problem List   Diagnosis Date Noted   Arthritis of  left shoulder region 04/24/2023   Biceps tendonitis, left 04/24/2023   S/P reverse total shoulder arthroplasty, left 04/16/2023   Depression, recurrent (HCC) 04/03/2023   S/P repair of paraesophageal hernia 03/12/2023   Arthritis of right shoulder region 12/24/2022   Biceps tendonitis on right 12/24/2022   S/P reverse total shoulder arthroplasty, right 12/05/2022   Vertigo 11/27/2022   Iron deficiency anemia 10/30/2022   Physical exam, annual 10/30/2022   Medication management 10/30/2022   Class 2 severe obesity due to excess calories with serious comorbidity in adult, unspecified BMI (HCC) 10/23/2022   Essential hypertension 10/23/2022   Prediabetes 10/23/2022   Hypothyroidism 10/23/2022   Insomnia 06/14/2022   Status post total left knee replacement 07/15/2021   Neoplasm of uncertain behavior of skin 05/25/2021   Generalized osteoarthritis of multiple sites 05/25/2021   Fibromyalgia syndrome 05/25/2021   Restless legs 03/16/2021   Hyperlipidemia 03/16/2021   Anxiety 03/16/2021   Status post right knee replacement 12/24/2020   Unilateral primary osteoarthritis, left knee 11/11/2020   Unilateral primary osteoarthritis, right knee 11/11/2020   Complete tear of right rotator cuff 08/12/2020   Hypertensive disorder 05/05/2019   Past Medical History:  Diagnosis Date   Allergy    Anemia    Anxiety    Arthritis    Depression    Fibromyalgia    GERD (gastroesophageal reflux disease)    Heart murmur    History of  hiatal hernia    Hyperlipidemia    Hypertension    Hypothyroidism    Melanoma (HCC)    face    Family History  Problem Relation Age of Onset   Dementia Mother    Heart Problems Mother    Stroke Mother    Hypertension Mother    Hypertension Father    Colon polyps Father    Diabetes Father    Hypertension Sister    Diabetes Sister        borderline   Fibromyalgia Sister    Hypertension Sister    Colon polyps Sister    Diabetes Sister    Fibromyalgia  Sister    Heart Problems Sister    Arthritis Sister    Hypertension Sister    Heart Problems Sister    Healthy Daughter    Diabetes Paternal Aunt    Colon cancer Neg Hx    Esophageal cancer Neg Hx    Rectal cancer Neg Hx    Stomach cancer Neg Hx    Breast cancer Neg Hx     Past Surgical History:  Procedure Laterality Date   ABDOMINAL HYSTERECTOMY  1985   AIKEN OSTEOTOMY Right 06/05/2022   Procedure: Quintella Reichert OSTEOTOMY;  Surgeon: Candelaria Stagers, DPM;  Location: [facility];  Service: Podiatry;  Laterality: Right;   BICEPT TENODESIS Right 12/05/2022   Procedure: RIGHT BICEPS TENODESIS;  Surgeon: Cammy Copa, MD;  Location: [facility] OR;  Service: Orthopedics;  Laterality: Right;   CAPSULOTOMY METATARSOPHALANGEAL Right 06/05/2022   Procedure: CAPSULOTOMY METATARSOPHALANGEAL;  Surgeon: Candelaria Stagers, DPM;  Location: [facility];  Service: Podiatry;  Laterality: Right;   CHOLECYSTECTOMY     COLONOSCOPY     HALLUX VALGUS LAPIDUS Right 06/05/2022   Procedure: HALLUX VALGUS LAPIDUS;  Surgeon: Candelaria Stagers, DPM;  Location: [facility];  Service: Podiatry;  Laterality: Right;   HAMMER TOE SURGERY Right 06/05/2022   Procedure: HAMMER TOE CORRECTION;  Surgeon: Candelaria Stagers, DPM;  Location: [facility] [place];  Service: Podiatry;  Laterality: Right;   REVERSE SHOULDER ARTHROPLASTY Right 12/05/2022   Procedure: RIGHT REVERSE SHOULDER ARTHROPLASTY;  Surgeon: Cammy Copa, MD;  Location: [facility] OR;  Service: Orthopedics;  Laterality: Right;   REVERSE SHOULDER ARTHROPLASTY Left 04/16/2023   Procedure: REVERSE SHOULDER ARTHROPLASTY;  Surgeon: Cammy Copa, MD;  Location: [facility] OR;  Service: Orthopedics;  Laterality: Left;   SHOULDER ARTHROSCOPY WITH ROTATOR CUFF REPAIR AND SUBACROMIAL DECOMPRESSION Right 08/12/2020   Procedure: RIGHT SHOULDER ARTHROSCOPY WITH ROTATOR CUFF REPAIR AND SUBACROMIAL DECOMPRESSION;  Surgeon: Kathryne Hitch, MD;  Location: [facility];  Service: Orthopedics;  Laterality: Right;   TOTAL KNEE ARTHROPLASTY Right 12/24/2020   Procedure: RIGHT TOTAL KNEE ARTHROPLASTY;  Surgeon: Kathryne Hitch, MD;  Location: [facility];  Service: Orthopedics;  Laterality: Right;   TOTAL KNEE ARTHROPLASTY Left 07/15/2021   Procedure: LEFT TOTAL KNEE ARTHROPLASTY;  Surgeon: Kathryne Hitch, MD;  Location: [facility];  Service: Orthopedics;  Laterality: Left;   XI ROBOTIC ASSISTED PARAESOPHAGEAL HERNIA REPAIR N/A 03/12/2023   Procedure: XI ROBOTIC ASSISTED PARAESOPHAGEAL HERNIA REPAIR WITH FUNDOPLICATION;  Surgeon: Berna Bue, MD;  Location: [facility];  Service: General;  Laterality: N/A;  180   Social History   Occupational History   Not on file  Tobacco Use   Smoking status: Never    Passive exposure: Past   Smokeless tobacco: Never  Vaping Use   Vaping status: Never  Used  Substance and Sexual Activity   Alcohol use: Not Currently   Drug use: Never   Sexual activity: Not Currently    Birth control/protection: Surgical    Comment: Hysterectomy

## 2023-07-11 NOTE — Progress Notes (Signed)
   THERAPIST PROGRESS NOTE  Session Time: 40 minutes  Participation Level: Active  Behavioral Response: CasualAlertDepressed  Type of Therapy: Individual Therapy  Treatment Goals addressed: Increase coping skills to manage depression and improve ability to perform daily activities by self report 1x per session   ProgressTowards Goals: Progressing  Interventions: CBT  Summary:  Denise Morales is a 63 y.o. female who presents for the scheduled appointment oriented x 5, appropriately dressed, and friendly.  Client denied hallucinations and delusions. Client reported today she had a pretty good holiday with her family.  Client reported she did not get around to completely decorating as she has done it years prior.  Client reported she has been struggling with figuring out if she is depressed or a lazy as she has found a strong dislike with how she goes about her daily activities.  Client reported ultimately she spends most of her day sitting around and not being up doing things as she would like to do.  Client reported she usually wakes up to take her medicine and then gives herself some time to sit before getting up but then time passes.  Client reported some days that she overall just has a feeling of not feeling like doing anything.  Client reported she wants to get back to him she was and feels like she has tried but nothing has worked. Evidence of progress towards goal: Client reported 1 negative thought pattern about herself that contributes to depression.  Suicidal/Homicidal: Nowithout intent/plan  Therapist Response:  Therapist began the appointment asking the client how she has been doing since last seen. Therapist used CBT to engage with active listening and positive emotional support. Therapist used CBT to ask the client to identify the source of the depressive thoughts and feelings and how changed her behaviors. Therapist used cbt to engage and discus reframing negative thoughts  about herself. Therapist used CBT ask the client to identify her progress with frequency of use with coping skills with continued practice in her daily activity.    Therapist assigned the client homework to practice self care.    Plan: Return again in 4 weeks.  Diagnosis: MDD, recurrent, moderate  Collaboration of Care: Patient refused AEB none requested by the client.  Patient/Guardian was advised Release of Information must be obtained prior to any record release in order to collaborate their care with an outside provider. Patient/Guardian was advised if they have not already done so to contact the registration department to sign all necessary forms in order for us  to release information regarding their care.   Consent: Patient/Guardian gives verbal consent for treatment and assignment of benefits for services provided during this visit. Patient/Guardian expressed understanding and agreed to proceed.   Angelice Piech Y Correne Lalani, LCSW 07/10/2023

## 2023-07-11 NOTE — Therapy (Signed)
OUTPATIENT PHYSICAL THERAPY TREATMENT NOTE   Patient Name: Denise Morales MRN: [id number] DOB:[birth year], 63 y.o., female Today's Date: 07/12/2023  END OF SESSION:  PT End of Session - 07/12/23 1529     Visit Number 18    Number of Visits 28    Date for PT Re-Evaluation 08/10/23    Authorization Type [employer]    Authorization Time Period Approved 4 visits 06/29/23-07/28/23    Authorization - Visit Number 2    Authorization - Number of Visits 4    PT Start Time 1530    PT Stop Time 1610    PT Time Calculation (min) 40 min    Activity Tolerance Patient tolerated treatment well                 Past Medical History:  Diagnosis Date   Allergy    Anemia    Anxiety    Arthritis    Depression    Fibromyalgia    GERD (gastroesophageal reflux disease)    Heart murmur    History of hiatal hernia    Hyperlipidemia    Hypertension    Hypothyroidism    Melanoma (HCC)    face   Past Surgical History:  Procedure Laterality Date   ABDOMINAL HYSTERECTOMY  1985   AIKEN OSTEOTOMY Right 06/05/2022   Procedure: Quintella Reichert OSTEOTOMY;  Surgeon: Candelaria Stagers, DPM;  Location: [facility];  Service: Podiatry;  Laterality: Right;   BICEPT TENODESIS Right 12/05/2022   Procedure: RIGHT BICEPS TENODESIS;  Surgeon: Cammy Copa, MD;  Location: [facility];  Service: Orthopedics;  Laterality: Right;   CAPSULOTOMY METATARSOPHALANGEAL Right 06/05/2022   Procedure: CAPSULOTOMY METATARSOPHALANGEAL;  Surgeon: Candelaria Stagers, DPM;  Location: [facility];  Service: Podiatry;  Laterality: Right;   CHOLECYSTECTOMY     COLONOSCOPY     HALLUX VALGUS LAPIDUS Right 06/05/2022   Procedure: HALLUX VALGUS LAPIDUS;  Surgeon: Candelaria Stagers, DPM;  Location: [facility];  Service: Podiatry;  Laterality: Right;   HAMMER TOE SURGERY Right 06/05/2022   Procedure: HAMMER TOE CORRECTION;  Surgeon: Candelaria Stagers, DPM;  Location: [facility];   Service: Podiatry;  Laterality: Right;   REVERSE SHOULDER ARTHROPLASTY Right 12/05/2022   Procedure: RIGHT REVERSE SHOULDER ARTHROPLASTY;  Surgeon: Cammy Copa, MD;  Location: [facility];  Service: Orthopedics;  Laterality: Right;   REVERSE SHOULDER ARTHROPLASTY Left 04/16/2023   Procedure: REVERSE SHOULDER ARTHROPLASTY;  Surgeon: Cammy Copa, MD;  Location: [facility] OR;  Service: Orthopedics;  Laterality: Left;   SHOULDER ARTHROSCOPY WITH ROTATOR CUFF REPAIR AND SUBACROMIAL DECOMPRESSION Right 08/12/2020   Procedure: RIGHT SHOULDER ARTHROSCOPY WITH ROTATOR CUFF REPAIR AND SUBACROMIAL DECOMPRESSION;  Surgeon: Kathryne Hitch, MD;  Location: [facility];  Service: Orthopedics;  Laterality: Right;   TOTAL KNEE ARTHROPLASTY Right 12/24/2020   Procedure: RIGHT TOTAL KNEE ARTHROPLASTY;  Surgeon: Kathryne Hitch, MD;  Location: [facility];  Service: Orthopedics;  Laterality: Right;   TOTAL KNEE ARTHROPLASTY Left 07/15/2021   Procedure: LEFT TOTAL KNEE ARTHROPLASTY;  Surgeon: Kathryne Hitch, MD;  Location: [facility];  Service: Orthopedics;  Laterality: Left;   XI ROBOTIC ASSISTED PARAESOPHAGEAL HERNIA REPAIR N/A 03/12/2023   Procedure: XI ROBOTIC ASSISTED PARAESOPHAGEAL HERNIA REPAIR WITH FUNDOPLICATION;  Surgeon: Berna Bue, MD;  Location: [facility];  Service: General;  Laterality: N/A;  180   Patient Active Problem List   Diagnosis Date Noted   Arthritis of left shoulder region  04/24/2023   Biceps tendonitis, left 04/24/2023   S/P reverse total shoulder arthroplasty, left 04/16/2023   Depression, recurrent (HCC) 04/03/2023   S/P repair of paraesophageal hernia 03/12/2023   Arthritis of right shoulder region 12/24/2022   Biceps tendonitis on right 12/24/2022   S/P reverse total shoulder arthroplasty, right 12/05/2022   Vertigo 11/27/2022   Iron deficiency anemia 10/30/2022   Physical exam, annual 10/30/2022   Medication management 10/30/2022   Class 2  severe obesity due to excess calories with serious comorbidity in adult, unspecified BMI (HCC) 10/23/2022   Essential hypertension 10/23/2022   Prediabetes 10/23/2022   Hypothyroidism 10/23/2022   Insomnia 06/14/2022   Status post total left knee replacement 07/15/2021   Neoplasm of uncertain behavior of skin 05/25/2021   Generalized osteoarthritis of multiple sites 05/25/2021   Fibromyalgia syndrome 05/25/2021   Restless legs 03/16/2021   Hyperlipidemia 03/16/2021   Anxiety 03/16/2021   Status post right knee replacement 12/24/2020   Unilateral primary osteoarthritis, left knee 11/11/2020   Unilateral primary osteoarthritis, right knee 11/11/2020   Complete tear of right rotator cuff 08/12/2020   Hypertensive disorder 05/05/2019    PCP: Park Meo, FNP   REFERRING PROVIDER: Julieanne Cotton, PA-C   REFERRING DIAG: [phone] (ICD-10-CM) - History of arthroplasty of left shoulder   THERAPY DIAG:  Acute pain of left shoulder  Muscle weakness (generalized)  Rationale for Evaluation and Treatment: Rehabilitation  ONSET DATE: L reverse TSA on 04/16/23   SUBJECTIVE:                                                                                                                                                                                     SUBJECTIVE STATEMENT:  07/12/2023 states she saw provider yesterday, was released and felt everything was going well. Still getting some cramping in deltoid when fatigued. Mild soreness after last session but brief and no increase in pain. Feels her arm continues to improve and she is able to pick up light objects from cabinet without issue.    EVAL: I have been using CPM machine and doing light activities in the home like washing cups and not trying to carry anything heavy, L TSA on 10/21/ 24 by Dr August Saucer.  MD as DC sling. I feel like it is a toothache pain last night. I would like to be able to return to yard work and my mud room. I am  having to maneuver clothes so I can try to dress myself.  I have been here before with Lelon Mast last time  PERTINENT HISTORY: allergies, asthma, anemia, fibromyalgia, GERD, HLD, HTN, hypothyroidism, R TSA, bil TKA, basal cell face CA, R  RTC before R TSA, bunion on R foot, hernia repair,    PAIN:  Are you having pain? NPRS scale: 0/10 Pain location: Left shoulder Pain description: Achy Aggravating factors: reaching, lifting Relieving factors: Resting   PRECAUTIONS: Per 05/28/23 Dr. August Saucer note: "plan at this time is continue with strengthening plus home exercise program"  RED FLAGS: None   WEIGHT BEARING RESTRICTIONS: Yes No weight bearing until released for AROM next visit  FALLS:  Has patient fallen in last 6 months?   YES  slipped on deck steps but no issues other than that time.  LIVING ENVIRONMENT: Lives with: lives with their family and including dtr SIL and grandkids Lives in: House/apartment Stairs: Yes: External: 5 steps; can reach both Has following equipment at home: Single point cane, Walker - 2 wheeled, Wheelchair (manual), Shower bench, and bed side commode  OCCUPATION: Retired in Community education officer   PLOF: Independent  PATIENT GOALS:To use my arm with out limitation   OBJECTIVE:  Note: Objective measures were completed at Evaluation unless otherwise noted. PATIENT SURVEYS:  FOTO 32%  predicted 54% FOTO 05/21/23 50%  FOTO 06/29/23 63    POSTURE: Rounded shoulders  Right shoulder more forward than Left  UPPER EXTREMITY ROM:   Active ROM Right eval Left eval Left 05/08/23 Left 05/10/2023 Left 05/16/2023 Left 05/29/23 Left 06/12/23 06/14/23 06/25/23 06/29/23  Shoulder flexion 131/ PROM 145 NT to surgery 04-16-23 PROM 85 100 PROM 120 PROM P: 132 deg painless A: 106 deg (standing)  A: 142 deg (supine) AA: supine, 144 deg A: 130deg seated, painless A: 134 deg  P: 145 deg  Shoulder extension 125/ PROM 142           Shoulder abduction   PROM 60   P: 102 deg mild  transient pain    A: 90 deg  Shoulder adduction            Shoulder internal rotation            Shoulder external rotation   PROM 20 30 PROM      A: 55 deg with mild pain (supine arm at side, propped on towel)  Elbow flexion            Elbow extension            Wrist flexion            Wrist extension            Wrist ulnar deviation            Wrist radial deviation            Wrist pronation            Wrist supination            (Blank rows = not tested)  UPPER EXTREMITY MMT:not formally assess at eval d/t ROM deficits and post-op status   MMT Right eval Left eval  Shoulder flexion  NT due to recent TSA surgery < 2 weeks  Shoulder extension    Shoulder abduction    Shoulder adduction    Shoulder internal rotation    Shoulder external rotation    Middle trapezius    Lower trapezius    Elbow flexion    Elbow extension    Wrist flexion    Wrist extension    Wrist ulnar deviation    Wrist radial deviation    Wrist pronation    Wrist supination    Grip strength (lbs)    (Blank  rows = not tested)  SHOULDER SPECIAL TESTS: NT due to L TSA reverse  JOINT MOBILITY TESTING:  NT  PALPATION:  Pt with steri strips on surgical site well intact  06/14/23: pt with concordant tenderness L infraspinatus/teres muscle bellies. No bony tenderness scapular spine or acromion  MISCELLANEOUS:  06/18/23:  - R shoulder w/ no overt bony tenderness, does have some tenderness about bicipital groove but no apparent weakness at elbow or shoulder with active movement. Shoulder AROM appears stiff but not painful and pt states comparable to her normal - L shoulder w/ PROM comparable to prior session, no increased tenderness compared to prior sessions    TODAY'S TREATMENT [facility] Adult PT Treatment:                                                DATE: 07/12/23 Therapeutic Exercise: Standing 2# bicep curl x12, 3# x8 cues for pacing  Shoulder 3 way AROM (flex/scap/abduction) to ~90 deg, x8;  emphasis on control and reducing shrug Green band 2x12 cues for posture Supine 1# DB shoulder flexion 2x10 Sidelying L GH ER AROM just above neutral x12  Manual Therapy: End of session, PROM to pt tolerance all directions w/ gentle oscillations to mitigate muscle guarding and promote relaxation    [facility] Adult PT Treatment:                                                DATE: 07/05/23 Therapeutic Exercise: Green band row 3x10 cues for form  Yellow band shoulder ext to neutral 2x8 cues for form and appropriate ROM  BIL shoulder flex/scap/abduction 3 way unweighted to 90 deg, x5 cues for scapular mechanics and pacing Sidelying ER x10, x15 L GH cues for positioning Verbal HEP review  Manual Therapy: PROM all directions to pt tolerance Rhythmic stabilizations 3x30sec long lever low amplitude    [facility] Adult PT Treatment:                                                DATE: 06/29/23 Therapeutic Exercise: Wall slides 2x10 Red band row x10 Green band row x10 HEP update + education/handout  Manual Therapy: PROM L GH flex/abd/scaption  Therapeutic Activity: MSK assessment + education FOTO + education Education/discussion re: progress with PT, symptom behavior as it affects activity tolerance, PT goals/POC    PATIENT EDUCATION: Education details: rationale for interventions, HEP  Person educated: Patient Education method: Explanation, Demonstration, Tactile cues, Verbal cues Education comprehension: verbalized understanding, returned demonstration, verbal cues required, tactile cues required, and needs further education     HOME EXERCISE PROGRAM: Access Code: [email or web address] URL: [email or web address] Date: 06/29/2023 Prepared by: Fransisco Hertz  Program Notes - with sidelying external rotation, please use towel tucked for elbow support and only go to neutral, as done in clinic  Exercises - Isometric Shoulder Abduction at Wall  - 2-3 x daily - 1 sets - 10 reps - 5 seconds  hold - Isometric Shoulder Extension at Wall  - 2-3 x daily - 10 reps - 5 seconds hold - Isometric Shoulder Flexion at Wall  - 2-3  x daily - 1 sets - 10 reps - 5 seconds hold - Sidelying Shoulder Abduction  - 2-3 x daily - 1 sets - 10 reps - Supine Shoulder Flexion AAROM with Hands Clasped  - 2-3 x daily - 1 sets - 8 reps - Supine Shoulder Flexion Extension Full Range AROM  - 2-3 x daily - 1 sets - 8 reps - Sidelying Shoulder External Rotation  - 2-3 x daily - 1 sets - 8 reps - Seated Shoulder Flexion  - 2-3 x daily - 1 sets - 8 reps - Standing Shoulder Row with Anchored Resistance  - 2-3 x weekly - 2-3 sets - 10 reps - red band hold   ASSESSMENT: CLINICAL IMPRESSION: 07/12/2023 Pt arrives w/o pain, states surgical follow up went well and she was released. She is now 12+ weeks post op, today introducing light DB strengthening with good tolerance, limited primarily by muscular fatigue. Also progressing volume for periscapular strengthening. Manual at end of session to mitigate muscle fatigue/soreness with pt endorsing good response. No adverse events, departs without pain. Recommend continuing along current POC in order to address relevant deficits and improve functional tolerance. Pt departs today's session in no acute distress, all voiced questions/concerns addressed appropriately from PT perspective.    EVAL: Patient is a 63 y.o. female who was seen today for physical therapy evaluation and treatment for 2 weeks s/p reverse total shoulder arthroplasty. L TSA on 04/16/23 by Dr Rise Paganini. Pt demonstrates decreased Left shoulder AROM, strength and also some decreased AROM of R shoulder from previous surgery. She does not complain of pain on entering clinic but does have discomfort and  pain and difficulty with ADLs/IADLs, driving, sleep quality, and participation in normal daily activities especially yard work which she desires ot return.. She requires skilled PT services at this time to address all  relevant deficits and return to PLOF   OBJECTIVE IMPAIRMENTS: decreased activity tolerance, decreased endurance, decreased ROM, decreased strength, impaired UE functional use, postural dysfunction, and pain.   ACTIVITY LIMITATIONS: carrying, lifting, sleeping, bathing, dressing, reach over head, and hygiene/grooming  PARTICIPATION LIMITATIONS: meal prep, cleaning, laundry, driving, shopping, community activity, and yard work  PERSONAL FACTORS: allergies, asthma, anemia, fibromyalgia, GERD, HLD, HTN, hypothyroidism, R TSA, bil TKA, basal cell face CA, R RTC before R TSA, bunion on R foot, hernia repair are also affecting patient's functional outcome.    GOALS: Goals reviewed with patient? Yes  SHORT TERM GOALS: Target date: 05-31-23  Pt will be independent with initial HEP Baseline: 05/31/23: pt reports good performance of HEP overall Goal status: MET  2.  Pt pain level will not be greater than 4/10 with active motion Baseline: Pt post surgery 2 weeks, only isometric ex 05/31/23: no pain with movement, just heaviness Goal status: MET  3.  Pt will achieve 90 degrees flexion  AROM Baseline: NT due to post surgery eval 05/31/23: able to achieve >90 deg gravity eliminated AROM 06/29/23: see ROM chart above  Goal status: MET  LONG TERM GOALS: Target date: 08/10/2023  (updated 06/29/23)  Pt will be independent with advanced HEP Baseline: needs reinforcement has had R TSR reverse 06/29/23: reports good HEP adherence  Goal status: ONGOING  2.  FOTO will improve from 32%  to   54% indicating improved functional mobility.  Baseline: eval 32% 06/29/23: 63  Goal status: MET  3.  Left shoulder IR and ER will return to [facility] to return to 2/10 or less NPS for as dressing and  grooming Baseline: Pt unable to dress without compensation and limited motion 06/29/23: reports return to basic ADLs without issue, does demo limitations in ROM as above with mild pain Goal status: ONGOING  4.  Pt will be able  to sleep on Left side without awaking due to pain Baseline: Pt post surgery 04-16-23 avoided lying on left side but now initial discomfort with lying on left side  06/29/23: sleeping on L shoulder without issue  Goal status: MET  5.  Left  shoulder AROM scaption will improve to minimally 0-140 degrees for improved overhead reaching Baseline: SEE AROM chart 06/29/23: see ROM chart above  Goal status: NEARLY MET  6.  Patient will demonstrate ability to push/pull at least 40# with <3/10 pain in order to return to heavy household and yardwork tasks.  Baseline: post op 04-16-23, only light weight such as coffee cup 06/29/23: not yet tested given proximity to surgery  Goal status: ONGOING   PLAN: PT FREQUENCY: 1-2x/week  PT DURATION: 6 weeks  PLANNED INTERVENTIONS: 97164- PT Re-evaluation, 97110-Therapeutic exercises, 97530- Therapeutic activity, 97112- Neuromuscular re-education, 97535- Self Care, 16109- Manual therapy, 97014- Electrical stimulation (unattended), [id number]- Electrical stimulation (manual), Patient/Family education, Taping, Dry Needling, Joint mobilization, Cryotherapy, and Moist heat  PLAN FOR NEXT SESSION: now 12+ weeks post op. Continue with light deltoid/RC strengthening within pt tolerance, periscapular strengthening, GH mobility.    Ashley Murrain PT, DPT 07/12/2023 4:24 PM

## 2023-07-12 ENCOUNTER — Encounter: Payer: Self-pay | Admitting: Physical Therapy

## 2023-07-12 ENCOUNTER — Ambulatory Visit: Payer: Medicaid Other | Admitting: Physical Therapy

## 2023-07-12 DIAGNOSIS — M25512 Pain in left shoulder: Secondary | ICD-10-CM | POA: Diagnosis not present

## 2023-07-12 DIAGNOSIS — M6281 Muscle weakness (generalized): Secondary | ICD-10-CM

## 2023-07-13 ENCOUNTER — Telehealth: Payer: Self-pay

## 2023-07-13 ENCOUNTER — Encounter: Payer: Medicaid Other | Admitting: Orthopedic Surgery

## 2023-07-13 NOTE — Telephone Encounter (Signed)
Copied from CRM [id number]. Topic: Clinical - Medical Advice >> Jul 13, 2023  9:14 AM Gildardo Pounds wrote: Reason for CRM: Patient was told to keep up with blood pressure for the week. Blood pressure seems to be goin up. Yesterday 164/87 pulse 65; Today is 170/96 164-87 Callback number [phone]  Attempted to call pt, to schedule an appt and no answer. Pls advise

## 2023-07-13 NOTE — Telephone Encounter (Signed)
Was able to speak to pt this morning re her bp being high.   Per pt has no symptoms of any kind and feels fine. Per pt stated that she will continue to monitor there bp over the weekend and will go to UC/ED if she feels the need to. Per pt will wait to talk to NP and see what the next step will be.   Pt also stated that these readings are when she gets up and before she takes her meds. Told pt to check her bp as well after she takes her meds to see if they have come down. Pt voiced understanding.

## 2023-07-13 NOTE — Telephone Encounter (Signed)
Attempted to call pt, no answer will try again.

## 2023-07-16 ENCOUNTER — Other Ambulatory Visit: Payer: Self-pay | Admitting: Family Medicine

## 2023-07-16 DIAGNOSIS — M797 Fibromyalgia: Secondary | ICD-10-CM

## 2023-07-17 ENCOUNTER — Encounter: Payer: Medicaid Other | Admitting: Physical Therapy

## 2023-07-18 NOTE — Therapy (Signed)
OUTPATIENT PHYSICAL THERAPY TREATMENT NOTE   Patient Name: Denise Morales MRN: [id number] DOB:10-Mar-1961, 63 y.o., female Today's Date: 07/19/2023  END OF SESSION:  PT End of Session - 07/19/23 1112     Visit Number 19    Number of Visits 28    Date for PT Re-Evaluation 08/10/23    Authorization Type [employer]    Authorization Time Period 06/29/23 - 07/28/23    Authorization - Visit Number 3    Authorization - Number of Visits 4    PT Start Time 1100    PT Stop Time 1143    PT Time Calculation (min) 43 min    Activity Tolerance Patient tolerated treatment well    Behavior During Therapy WFL for tasks assessed/performed                  Past Medical History:  Diagnosis Date   Allergy    Anemia    Anxiety    Arthritis    Depression    Fibromyalgia    GERD (gastroesophageal reflux disease)    Heart murmur    History of hiatal hernia    Hyperlipidemia    Hypertension    Hypothyroidism    Melanoma (HCC)    face   Past Surgical History:  Procedure Laterality Date   ABDOMINAL HYSTERECTOMY  1985   AIKEN OSTEOTOMY Right 06/05/2022   Procedure: Quintella Reichert OSTEOTOMY;  Surgeon: Candelaria Stagers, DPM;  Location: [facility];  Service: Podiatry;  Laterality: Right;   BICEPT TENODESIS Right 12/05/2022   Procedure: RIGHT BICEPS TENODESIS;  Surgeon: Cammy Copa, MD;  Location: [facility] OR;  Service: Orthopedics;  Laterality: Right;   CAPSULOTOMY METATARSOPHALANGEAL Right 06/05/2022   Procedure: CAPSULOTOMY METATARSOPHALANGEAL;  Surgeon: Candelaria Stagers, DPM;  Location: [facility];  Service: Podiatry;  Laterality: Right;   CHOLECYSTECTOMY     COLONOSCOPY     HALLUX VALGUS LAPIDUS Right 06/05/2022   Procedure: HALLUX VALGUS LAPIDUS;  Surgeon: Candelaria Stagers, DPM;  Location: [facility];  Service: Podiatry;  Laterality: Right;   HAMMER TOE SURGERY Right 06/05/2022   Procedure: HAMMER TOE CORRECTION;  Surgeon: Candelaria Stagers,  DPM;  Location: [facility];  Service: Podiatry;  Laterality: Right;   REVERSE SHOULDER ARTHROPLASTY Right 12/05/2022   Procedure: RIGHT REVERSE SHOULDER ARTHROPLASTY;  Surgeon: Cammy Copa, MD;  Location: [facility] OR;  Service: Orthopedics;  Laterality: Right;   REVERSE SHOULDER ARTHROPLASTY Left 04/16/2023   Procedure: REVERSE SHOULDER ARTHROPLASTY;  Surgeon: Cammy Copa, MD;  Location: [facility] OR;  Service: Orthopedics;  Laterality: Left;   SHOULDER ARTHROSCOPY WITH ROTATOR CUFF REPAIR AND SUBACROMIAL DECOMPRESSION Right 08/12/2020   Procedure: RIGHT SHOULDER ARTHROSCOPY WITH ROTATOR CUFF REPAIR AND SUBACROMIAL DECOMPRESSION;  Surgeon: Kathryne Hitch, MD;  Location: [facility];  Service: Orthopedics;  Laterality: Right;   TOTAL KNEE ARTHROPLASTY Right 12/24/2020   Procedure: RIGHT TOTAL KNEE ARTHROPLASTY;  Surgeon: Kathryne Hitch, MD;  Location: [facility];  Service: Orthopedics;  Laterality: Right;   TOTAL KNEE ARTHROPLASTY Left 07/15/2021   Procedure: LEFT TOTAL KNEE ARTHROPLASTY;  Surgeon: Kathryne Hitch, MD;  Location: [facility];  Service: Orthopedics;  Laterality: Left;   XI ROBOTIC ASSISTED PARAESOPHAGEAL HERNIA REPAIR N/A 03/12/2023   Procedure: XI ROBOTIC ASSISTED PARAESOPHAGEAL HERNIA REPAIR WITH FUNDOPLICATION;  Surgeon: Berna Bue, MD;  Location: [facility];  Service: General;  Laterality: N/A;  180   Patient Active Problem List  Diagnosis Date Noted   Arthritis of left shoulder region 04/24/2023   Biceps tendonitis, left 04/24/2023   S/P reverse total shoulder arthroplasty, left 04/16/2023   Depression, recurrent (HCC) 04/03/2023   S/P repair of paraesophageal hernia 03/12/2023   Arthritis of right shoulder region 12/24/2022   Biceps tendonitis on right 12/24/2022   S/P reverse total shoulder arthroplasty, right 12/05/2022   Vertigo 11/27/2022   Iron deficiency anemia 10/30/2022   Physical exam, annual 10/30/2022    Medication management 10/30/2022   Class 2 severe obesity due to excess calories with serious comorbidity in adult, unspecified BMI (HCC) 10/23/2022   Essential hypertension 10/23/2022   Prediabetes 10/23/2022   Hypothyroidism 10/23/2022   Insomnia 06/14/2022   Status post total left knee replacement 07/15/2021   Neoplasm of uncertain behavior of skin 05/25/2021   Generalized osteoarthritis of multiple sites 05/25/2021   Fibromyalgia syndrome 05/25/2021   Restless legs 03/16/2021   Hyperlipidemia 03/16/2021   Anxiety 03/16/2021   Status post right knee replacement 12/24/2020   Unilateral primary osteoarthritis, left knee 11/11/2020   Unilateral primary osteoarthritis, right knee 11/11/2020   Complete tear of right rotator cuff 08/12/2020   Hypertensive disorder 05/05/2019    PCP: Park Meo, FNP   REFERRING PROVIDER: Julieanne Cotton, PA-C   REFERRING DIAG: [phone] (ICD-10-CM) - History of arthroplasty of left shoulder   THERAPY DIAG:  Acute pain of left shoulder  Muscle weakness (generalized)  Rationale for Evaluation and Treatment: Rehabilitation  ONSET DATE: L reverse TSA on 04/16/23   SUBJECTIVE:                                                                                                                                                                                     SUBJECTIVE STATEMENT:  Patient reports she is doing well, no new issues.    EVAL: I have been using CPM machine and doing light activities in the home like washing cups and not trying to carry anything heavy, L TSA on 10/21/ 24 by Dr August Saucer.  MD as DC sling. I feel like it is a toothache pain last night. I would like to be able to return to yard work and my mud room. I am having to maneuver clothes so I can try to dress myself.  I have been here before with Lelon Mast last time  PERTINENT HISTORY: allergies, asthma, anemia, fibromyalgia, GERD, HLD, HTN, hypothyroidism, R TSA, bil TKA, basal cell  face CA, R RTC before R TSA, bunion on R foot, hernia repair,    PAIN:  Are you having pain? NPRS scale: 0/10 Pain location: Left shoulder Pain description: Achy Aggravating factors: reaching, lifting Relieving  factors: Resting   PRECAUTIONS: Per 05/28/23 Dr. August Saucer note: "plan at this time is continue with strengthening plus home exercise program"  RED FLAGS: None   WEIGHT BEARING RESTRICTIONS: Yes No weight bearing until released for AROM next visit  FALLS:  Has patient fallen in last 6 months?   YES  slipped on deck steps but no issues other than that time.  Morales ENVIRONMENT: Lives with: lives with their family and including dtr SIL and grandkids Lives in: House/apartment Stairs: Yes: External: 5 steps; can reach both Has following equipment at home: Single point cane, Walker - 2 wheeled, Wheelchair (manual), Shower bench, and bed side commode  OCCUPATION: Retired in Community education officer   PLOF: Independent  PATIENT GOALS:To use my arm with out limitation   OBJECTIVE:  Note: Objective measures were completed at Evaluation unless otherwise noted. PATIENT SURVEYS:  FOTO 32%  predicted 54% FOTO 05/21/23 50%  FOTO 06/29/23 63    POSTURE: Rounded shoulders  Right shoulder more forward than Left  UPPER EXTREMITY ROM:   Active ROM Right eval Left eval Left 05/08/23 Left 05/10/2023 Left 05/16/2023 Left 05/29/23 Left 06/12/23 06/14/23 06/25/23 06/29/23 Left 07/19/2023  Shoulder flexion 131/ PROM 145 NT to surgery 04-16-23 PROM 85 100 PROM 120 PROM P: 132 deg painless A: 106 deg (standing)  A: 142 deg (supine) AA: supine, 144 deg A: 130deg seated, painless A: 134 deg  P: 145 deg 135  Shoulder extension 125/ PROM 142            Shoulder abduction   PROM 60   P: 102 deg mild transient pain    A: 90 deg 100  Shoulder adduction             Shoulder internal rotation           Reach to SIJ  Shoulder external rotation   PROM 20 30 PROM      A: 55 deg with mild pain (supine arm at  side, propped on towel) T1  Elbow flexion             Elbow extension             Wrist flexion             Wrist extension             Wrist ulnar deviation             Wrist radial deviation             Wrist pronation             Wrist supination             (Blank rows = not tested)  UPPER EXTREMITY MMT:not formally assess at eval d/t ROM deficits and post-op status   MMT Right eval Left eval  Shoulder flexion  NT due to recent TSA surgery < 2 weeks  Shoulder extension    Shoulder abduction    Shoulder adduction    Shoulder internal rotation    Shoulder external rotation    Middle trapezius    Lower trapezius    Elbow flexion    Elbow extension    Wrist flexion    Wrist extension    Wrist ulnar deviation    Wrist radial deviation    Wrist pronation    Wrist supination    Grip strength (lbs)    (Blank rows = not tested)  SHOULDER SPECIAL TESTS: NT due to L TSA reverse  JOINT  MOBILITY TESTING:  NT  PALPATION:  Pt with steri strips on surgical site well intact  06/14/23: pt with concordant tenderness L infraspinatus/teres muscle bellies. No bony tenderness scapular spine or acromion  MISCELLANEOUS:  06/18/23:  - R shoulder w/ no overt bony tenderness, does have some tenderness about bicipital groove but no apparent weakness at elbow or shoulder with active movement. Shoulder AROM appears stiff but not painful and pt states comparable to her normal - L shoulder w/ PROM comparable to prior session, no increased tenderness compared to prior sessions    TODAY'S TREATMENT [facility] Adult PT Treatment:                                                DATE: 07/19/23 Therapeutic Exercise: Seated overhead pulley x 2 min Supine shoulder flexion with 2# 2 x 10 Sidelying shoulder abduction with 2# 2 x 10 Inclined 45 deg shoulder flexion with 1# 2 x 10 Row with blue 2 x 10 Extension with red 2 x 10 Standing extension with dowel x 10 Standing dowel slide up back x  10 Standing dowel and towel pulling across lower back x 10 each Standing scaption with 1# to 90 deg 2 x 10   [facility] Adult PT Treatment:                                                DATE: 07/12/23 Therapeutic Exercise: Standing 2# bicep curl x12, 3# x8 cues for pacing  Shoulder 3 way AROM (flex/scap/abduction) to ~90 deg, x8; emphasis on control and reducing shrug Green band 2x12 cues for posture Supine 1# DB shoulder flexion 2x10 Sidelying L GH ER AROM just above neutral x12 Manual Therapy: End of session, PROM to pt tolerance all directions w/ gentle oscillations to mitigate muscle guarding and promote relaxation  [facility] Adult PT Treatment:                                                DATE: 07/05/23 Therapeutic Exercise: Green band row 3x10 cues for form  Yellow band shoulder ext to neutral 2x8 cues for form and appropriate ROM  BIL shoulder flex/scap/abduction 3 way unweighted to 90 deg, x5 cues for scapular mechanics and pacing Sidelying ER x10, x15 L GH cues for positioning Verbal HEP review Manual Therapy: PROM all directions to pt tolerance Rhythmic stabilizations 3x30sec long lever low amplitude  [facility] Adult PT Treatment:                                                DATE: 06/29/23 Therapeutic Exercise: Wall slides 2x10 Red band row x10 Green band row x10 HEP update + education/handout Manual Therapy: PROM L GH flex/abd/scaption Therapeutic Activity: MSK assessment + education FOTO + education Education/discussion re: progress with PT, symptom behavior as it affects activity tolerance, PT goals/POC    PATIENT EDUCATION: Education details: HEP update Person educated: Patient Education method: Explanation, Demonstration, Tactile cues, Verbal cues, Handout  Education comprehension: verbalized understanding, returned demonstration, verbal cues required, tactile cues required, and needs further education     HOME EXERCISE PROGRAM: Access Code:  [email or web address]   ASSESSMENT: CLINICAL IMPRESSION: Patient tolerated therapy well with no adverse effects. Therapy focused primarily on progressing left shoulder strength and improving ability to reach behind her back. She does continue to exhibit strength deficit of the left shoulder and fatigues quickly with exercises resulting in shrug compensation with elevation. Incorporated some stretching for the left shoulder to improve her ability to reach behind her lower back as this is one of her main limitations in range of motion. Updated her HEP to add IR behind back stretching with good tolerance. Patient would benefit from continued skilled PT to progress her mobility and strength in order to reduce pain and maximize functional ability.   EVAL: Patient is a 63 y.o. female who was seen today for physical therapy evaluation and treatment for 2 weeks s/p reverse total shoulder arthroplasty. L TSA on 04/16/23 by Dr Rise Paganini. Pt demonstrates decreased Left shoulder AROM, strength and also some decreased AROM of R shoulder from previous surgery. She does not complain of pain on entering clinic but does have discomfort and  pain and difficulty with ADLs/IADLs, driving, sleep quality, and participation in normal daily activities especially yard work which she desires ot return.. She requires skilled PT services at this time to address all relevant deficits and return to PLOF   OBJECTIVE IMPAIRMENTS: decreased activity tolerance, decreased endurance, decreased ROM, decreased strength, impaired UE functional use, postural dysfunction, and pain.   ACTIVITY LIMITATIONS: carrying, lifting, sleeping, bathing, dressing, reach over head, and hygiene/grooming  PARTICIPATION LIMITATIONS: meal prep, cleaning, laundry, driving, shopping, community activity, and yard work  PERSONAL FACTORS: allergies, asthma, anemia, fibromyalgia, GERD, HLD, HTN, hypothyroidism, R TSA, bil TKA, basal cell face CA, R RTC before R TSA, bunion  on R foot, hernia repair are also affecting patient's functional outcome.    GOALS: Goals reviewed with patient? Yes  SHORT TERM GOALS: Target date: 05-31-23  Pt will be independent with initial HEP Baseline: 05/31/23: pt reports good performance of HEP overall Goal status: MET  2.  Pt pain level will not be greater than 4/10 with active motion Baseline: Pt post surgery 2 weeks, only isometric ex 05/31/23: no pain with movement, just heaviness Goal status: MET  3.  Pt will achieve 90 degrees flexion  AROM Baseline: NT due to post surgery eval 05/31/23: able to achieve >90 deg gravity eliminated AROM 06/29/23: see ROM chart above  Goal status: MET  LONG TERM GOALS: Target date: 08/10/2023  (updated 06/29/23)  Pt will be independent with advanced HEP Baseline: needs reinforcement has had R TSR reverse 06/29/23: reports good HEP adherence  Goal status: ONGOING  2.  FOTO will improve from 32%  to   54% indicating improved functional mobility.  Baseline: eval 32% 06/29/23: 63  Goal status: MET  3.  Left shoulder IR and ER will return to [facility] to return to 2/10 or less NPS for as dressing and grooming Baseline: Pt unable to dress without compensation and limited motion 06/29/23: reports return to basic ADLs without issue, does demo limitations in ROM as above with mild pain Goal status: ONGOING  4.  Pt will be able to sleep on Left side without awaking due to pain Baseline: Pt post surgery 04-16-23 avoided lying on left side but now initial discomfort with lying on left side  06/29/23: sleeping on L shoulder without  issue  Goal status: MET  5.  Left  shoulder AROM scaption will improve to minimally 0-140 degrees for improved overhead reaching Baseline: SEE AROM chart 06/29/23: see ROM chart above  Goal status: NEARLY MET  6.  Patient will demonstrate ability to push/pull at least 40# with <3/10 pain in order to return to heavy household and yardwork tasks.  Baseline: post op 04-16-23, only  light weight such as coffee cup 06/29/23: not yet tested given proximity to surgery  Goal status: ONGOING   PLAN: PT FREQUENCY: 1-2x/week  PT DURATION: 6 weeks  PLANNED INTERVENTIONS: 97164- PT Re-evaluation, 97110-Therapeutic exercises, 97530- Therapeutic activity, 97112- Neuromuscular re-education, 97535- Self Care, 16109- Manual therapy, 97014- Electrical stimulation (unattended), [id number]- Electrical stimulation (manual), Patient/Family education, Taping, Dry Needling, Joint mobilization, Cryotherapy, and Moist heat  PLAN FOR NEXT SESSION: Continue with light deltoid/RC strengthening within pt tolerance, periscapular strengthening, GH mobility.    Rosana Hoes, PT, DPT, LAT, ATC 07/19/23  11:56 AM Phone: [phone] Fax: [phone]

## 2023-07-19 ENCOUNTER — Ambulatory Visit: Payer: Medicaid Other | Admitting: Physical Therapy

## 2023-07-19 ENCOUNTER — Other Ambulatory Visit: Payer: Self-pay | Admitting: Family Medicine

## 2023-07-19 ENCOUNTER — Other Ambulatory Visit: Payer: Self-pay

## 2023-07-19 ENCOUNTER — Encounter: Payer: Self-pay | Admitting: Physical Therapy

## 2023-07-19 DIAGNOSIS — M25512 Pain in left shoulder: Secondary | ICD-10-CM

## 2023-07-19 DIAGNOSIS — M6281 Muscle weakness (generalized): Secondary | ICD-10-CM

## 2023-07-19 DIAGNOSIS — M797 Fibromyalgia: Secondary | ICD-10-CM

## 2023-07-19 NOTE — Patient Instructions (Signed)
Access Code: [email or web address] URL: [email or web address] Date: 07/19/2023 Prepared by: Rosana Hoes  Exercises - Isometric Shoulder Abduction at Wall  - 2-3 x daily - 1 sets - 10 reps - 5 seconds hold - Isometric Shoulder Extension at Wall  - 2-3 x daily - 10 reps - 5 seconds hold - Isometric Shoulder Flexion at Wall  - 2-3 x daily - 1 sets - 10 reps - 5 seconds hold - Sidelying Shoulder Abduction  - 2-3 x daily - 1 sets - 10 reps - Supine Shoulder Flexion Extension Full Range AROM  - 2-3 x daily - 1 sets - 8 reps - Sidelying Shoulder External Rotation  - 2-3 x daily - 1 sets - 8 reps - Seated Shoulder Flexion  - 2-3 x daily - 1 sets - 8 reps - Standing Shoulder Row with Anchored Resistance  - 2-3 x weekly - 2-3 sets - 10 reps - red band hold - Standing Shoulder Internal Rotation AAROM Behind Back with Towel   - 2-3 x daily - 2 sets - 10 reps - 5 seconds hold

## 2023-07-24 ENCOUNTER — Other Ambulatory Visit: Payer: Self-pay

## 2023-07-24 ENCOUNTER — Encounter: Payer: Medicaid Other | Admitting: Physical Therapy

## 2023-07-24 ENCOUNTER — Other Ambulatory Visit: Payer: Self-pay | Admitting: Family Medicine

## 2023-07-24 DIAGNOSIS — M797 Fibromyalgia: Secondary | ICD-10-CM

## 2023-07-24 MED ORDER — PREGABALIN 75 MG PO CAPS: 75.0000 mg | ORAL_CAPSULE | Freq: Two times a day (BID) | ORAL | 3 refills | Status: DC

## 2023-07-24 NOTE — Telephone Encounter (Signed)
Copied from CRM [id number]. Topic: Clinical - Medication Question >> Jul 24, 2023  9:09 AM Jorje Guild R wrote: Reason for CRM: Patient wants Nurse to contact her because medication is not showing on MyChart for PCP but it does for another. Dct Howard normally refills scripts but not showing in system. Please call patient as soon as possible. Patient have been without medication for a few days. This is for fibromyalgia pain, which is starting to become unbearable.

## 2023-07-26 ENCOUNTER — Encounter: Payer: Self-pay | Admitting: Physical Therapy

## 2023-07-26 ENCOUNTER — Other Ambulatory Visit: Payer: Self-pay

## 2023-07-26 ENCOUNTER — Ambulatory Visit: Payer: Medicaid Other | Admitting: Physical Therapy

## 2023-07-26 DIAGNOSIS — M25512 Pain in left shoulder: Secondary | ICD-10-CM

## 2023-07-26 DIAGNOSIS — M6281 Muscle weakness (generalized): Secondary | ICD-10-CM

## 2023-07-26 NOTE — Therapy (Signed)
OUTPATIENT PHYSICAL THERAPY TREATMENT NOTE  DISCHARGE   Patient Name: Denise Morales MRN: [id number] DOB:05-03-1961, 63 y.o., female Today's Date: 07/26/2023   END OF SESSION:  PT End of Session - 07/26/23 1110     Visit Number 20    Number of Visits 28    Date for PT Re-Evaluation 08/10/23    Authorization Type [employer]    Authorization Time Period 06/29/23 - 07/28/23    Authorization - Visit Number 4    Authorization - Number of Visits 4    PT Start Time 1105    PT Stop Time 1145    PT Time Calculation (min) 40 min    Activity Tolerance Patient tolerated treatment well    Behavior During Therapy WFL for tasks assessed/performed                   Past Medical History:  Diagnosis Date   Allergy    Anemia    Anxiety    Arthritis    Depression    Fibromyalgia    GERD (gastroesophageal reflux disease)    Heart murmur    History of hiatal hernia    Hyperlipidemia    Hypertension    Hypothyroidism    Melanoma (HCC)    face   Past Surgical History:  Procedure Laterality Date   ABDOMINAL HYSTERECTOMY  1985   AIKEN OSTEOTOMY Right 06/05/2022   Procedure: Quintella Reichert OSTEOTOMY;  Surgeon: Candelaria Stagers, DPM;  Location: [facility];  Service: Podiatry;  Laterality: Right;   BICEPT TENODESIS Right 12/05/2022   Procedure: RIGHT BICEPS TENODESIS;  Surgeon: Cammy Copa, MD;  Location: [facility] OR;  Service: Orthopedics;  Laterality: Right;   CAPSULOTOMY METATARSOPHALANGEAL Right 06/05/2022   Procedure: CAPSULOTOMY METATARSOPHALANGEAL;  Surgeon: Candelaria Stagers, DPM;  Location: [facility] [place];  Service: Podiatry;  Laterality: Right;   CHOLECYSTECTOMY     COLONOSCOPY     HALLUX VALGUS LAPIDUS Right 06/05/2022   Procedure: HALLUX VALGUS LAPIDUS;  Surgeon: Candelaria Stagers, DPM;  Location: [facility];  Service: Podiatry;  Laterality: Right;   HAMMER TOE SURGERY Right 06/05/2022   Procedure: HAMMER TOE CORRECTION;  Surgeon:  Candelaria Stagers, DPM;  Location: [facility];  Service: Podiatry;  Laterality: Right;   REVERSE SHOULDER ARTHROPLASTY Right 12/05/2022   Procedure: RIGHT REVERSE SHOULDER ARTHROPLASTY;  Surgeon: Cammy Copa, MD;  Location: [facility] OR;  Service: Orthopedics;  Laterality: Right;   REVERSE SHOULDER ARTHROPLASTY Left 04/16/2023   Procedure: REVERSE SHOULDER ARTHROPLASTY;  Surgeon: Cammy Copa, MD;  Location: [facility] OR;  Service: Orthopedics;  Laterality: Left;   SHOULDER ARTHROSCOPY WITH ROTATOR CUFF REPAIR AND SUBACROMIAL DECOMPRESSION Right 08/12/2020   Procedure: RIGHT SHOULDER ARTHROSCOPY WITH ROTATOR CUFF REPAIR AND SUBACROMIAL DECOMPRESSION;  Surgeon: Kathryne Hitch, MD;  Location: [facility];  Service: Orthopedics;  Laterality: Right;   TOTAL KNEE ARTHROPLASTY Right 12/24/2020   Procedure: RIGHT TOTAL KNEE ARTHROPLASTY;  Surgeon: Kathryne Hitch, MD;  Location: [facility];  Service: Orthopedics;  Laterality: Right;   TOTAL KNEE ARTHROPLASTY Left 07/15/2021   Procedure: LEFT TOTAL KNEE ARTHROPLASTY;  Surgeon: Kathryne Hitch, MD;  Location: [facility];  Service: Orthopedics;  Laterality: Left;   XI ROBOTIC ASSISTED PARAESOPHAGEAL HERNIA REPAIR N/A 03/12/2023   Procedure: XI ROBOTIC ASSISTED PARAESOPHAGEAL HERNIA REPAIR WITH FUNDOPLICATION;  Surgeon: Berna Bue, MD;  Location: [facility];  Service: General;  Laterality: N/A;  180   Patient Active  Problem List   Diagnosis Date Noted   Arthritis of left shoulder region 04/24/2023   Biceps tendonitis, left 04/24/2023   S/P reverse total shoulder arthroplasty, left 04/16/2023   Depression, recurrent (HCC) 04/03/2023   S/P repair of paraesophageal hernia 03/12/2023   Arthritis of right shoulder region 12/24/2022   Biceps tendonitis on right 12/24/2022   S/P reverse total shoulder arthroplasty, right 12/05/2022   Vertigo 11/27/2022   Iron deficiency anemia 10/30/2022   Physical exam, annual  10/30/2022   Medication management 10/30/2022   Class 2 severe obesity due to excess calories with serious comorbidity in adult, unspecified BMI (HCC) 10/23/2022   Essential hypertension 10/23/2022   Prediabetes 10/23/2022   Hypothyroidism 10/23/2022   Insomnia 06/14/2022   Status post total left knee replacement 07/15/2021   Neoplasm of uncertain behavior of skin 05/25/2021   Generalized osteoarthritis of multiple sites 05/25/2021   Fibromyalgia syndrome 05/25/2021   Restless legs 03/16/2021   Hyperlipidemia 03/16/2021   Anxiety 03/16/2021   Status post right knee replacement 12/24/2020   Unilateral primary osteoarthritis, left knee 11/11/2020   Unilateral primary osteoarthritis, right knee 11/11/2020   Complete tear of right rotator cuff 08/12/2020   Hypertensive disorder 05/05/2019    PCP: Park Meo, FNP   REFERRING PROVIDER: Julieanne Cotton, PA-C   REFERRING DIAG: [phone] (ICD-10-CM) - History of arthroplasty of left shoulder   THERAPY DIAG:  Acute pain of left shoulder  Muscle weakness (generalized)  Rationale for Evaluation and Treatment: Rehabilitation  ONSET DATE: L reverse TSA on 04/16/23   SUBJECTIVE:                                                                                                                                                                                     SUBJECTIVE STATEMENT:  Patient reports she continues to do well. She feels she can continue with her exercises independently and be done with therapy at this time.   EVAL: I have been using CPM machine and doing light activities in the home like washing cups and not trying to carry anything heavy, L TSA on 10/21/ 24 by Dr August Saucer.  MD as DC sling. I feel like it is a toothache pain last night. I would like to be able to return to yard work and my mud room. I am having to maneuver clothes so I can try to dress myself.  I have been here before with Lelon Mast last time  PERTINENT  HISTORY: allergies, asthma, anemia, fibromyalgia, GERD, HLD, HTN, hypothyroidism, R TSA, bil TKA, basal cell face CA, R RTC before R TSA, bunion on R foot, hernia repair,    PAIN:  Are  you having pain? NPRS scale: 0/10 Pain location: Left shoulder Pain description: Achy Aggravating factors: reaching, lifting Relieving factors: Resting  PRECAUTIONS: Per 05/28/23 Dr. August Saucer note: "plan at this time is continue with strengthening plus home exercise program"  RED FLAGS: None   WEIGHT BEARING RESTRICTIONS: Yes No weight bearing until released for AROM next visit  FALLS:  Has patient fallen in last 6 months?   YES  slipped on deck steps but no issues other than that time.  LIVING ENVIRONMENT: Lives with: lives with their family and including dtr SIL and grandkids Lives in: House/apartment Stairs: Yes: External: 5 steps; can reach both Has following equipment at home: Single point cane, Walker - 2 wheeled, Wheelchair (manual), Shower bench, and bed side commode  OCCUPATION: Retired in Community education officer   PLOF: Independent  PATIENT GOALS:To use my arm with out limitation   OBJECTIVE:  Note: Objective measures were completed at Evaluation unless otherwise noted. PATIENT SURVEYS:  FOTO 32%  predicted 54% 05/21/23 50%  06/29/23 63%    POSTURE: Rounded shoulders  Right shoulder more forward than Left  UPPER EXTREMITY ROM:   Active ROM Right eval Left eval Left 05/08/23 Left 05/10/2023 Left 05/16/2023 Left 05/29/23 Left 06/12/23 06/14/23 06/25/23 06/29/23 Left 07/19/2023 Left 07/26/2023  Shoulder flexion 131/ PROM 145 NT to surgery 04-16-23 PROM 85 100 PROM 120 PROM P: 132 deg painless A: 106 deg (standing)  A: 142 deg (supine) AA: supine, 144 deg A: 130deg seated, painless A: 134 deg  P: 145 deg 135 135  Shoulder extension 125/ PROM 142             Shoulder abduction   PROM 60   P: 102 deg mild transient pain    A: 90 deg 100 110  Shoulder adduction              Shoulder internal  rotation           Reach to SIJ Reach to SIJ  Shoulder external rotation   PROM 20 30 PROM      A: 55 deg with mild pain (supine arm at side, propped on towel) T1 T1  Elbow flexion              Elbow extension              Wrist flexion              Wrist extension              Wrist ulnar deviation              Wrist radial deviation              Wrist pronation              Wrist supination              (Blank rows = not tested)  UPPER EXTREMITY MMT:not formally assess at eval d/t ROM deficits and post-op status   MMT Right eval Left eval  Shoulder flexion  NT due to recent TSA surgery < 2 weeks  Shoulder extension    Shoulder abduction    Shoulder adduction    Shoulder internal rotation    Shoulder external rotation    Middle trapezius    Lower trapezius    Elbow flexion    Elbow extension    Wrist flexion    Wrist extension    Wrist ulnar deviation    Wrist radial deviation  Wrist pronation    Wrist supination    Grip strength (lbs)    (Blank rows = not tested)  SHOULDER SPECIAL TESTS: NT due to L TSA reverse  JOINT MOBILITY TESTING:  NT  PALPATION:  Pt with steri strips on surgical site well intact  06/14/23: pt with concordant tenderness L infraspinatus/teres muscle bellies. No bony tenderness scapular spine or acromion  MISCELLANEOUS:  06/18/23:  - R shoulder w/ no overt bony tenderness, does have some tenderness about bicipital groove but no apparent weakness at elbow or shoulder with active movement. Shoulder AROM appears stiff but not painful and pt states comparable to her normal - L shoulder w/ PROM comparable to prior session, no increased tenderness compared to prior sessions    TODAY'S TREATMENT [facility] Adult PT Treatment:                                                DATE: 07/26/23 Therapeutic Exercise: Supine shoulder flexion with 2# DB 2 x 10 Sidelying shoulder abduction with 2# DB 2 x 10 Inclined 45 deg shoulder flexion with 1# DB 2 x 10, no  weight x 10 Row with blue 2 x 15 Extension with green 2 x 10 Supine horizontal abduction with red 2 x 10 Standing shoulder IR with towel pulling across lower back 2 x 10 Standing shoulder flexion 2 x 10 Standing shoulder abduction 2 x 10   [facility] Adult PT Treatment:                                                DATE: 07/19/23 Therapeutic Exercise: Seated overhead pulley x 2 min Supine shoulder flexion with 2# 2 x 10 Sidelying shoulder abduction with 2# 2 x 10 Inclined 45 deg shoulder flexion with 1# 2 x 10 Row with blue 2 x 10 Extension with red 2 x 10 Standing extension with dowel x 10 Standing dowel slide up back x 10 Standing dowel and towel pulling across lower back x 10 each Standing scaption with 1# to 90 deg 2 x 10  [facility] Adult PT Treatment:                                                DATE: 07/12/23 Therapeutic Exercise: Standing 2# bicep curl x12, 3# x8 cues for pacing  Shoulder 3 way AROM (flex/scap/abduction) to ~90 deg, x8; emphasis on control and reducing shrug Green band 2x12 cues for posture Supine 1# DB shoulder flexion 2x10 Sidelying L GH ER AROM just above neutral x12 Manual Therapy: End of session, PROM to pt tolerance all directions w/ gentle oscillations to mitigate muscle guarding and promote relaxation  [facility] Adult PT Treatment:                                                DATE: 07/05/23 Therapeutic Exercise: Green band row 3x10 cues for form  Yellow band shoulder ext to neutral 2x8 cues for form and appropriate  ROM  BIL shoulder flex/scap/abduction 3 way unweighted to 90 deg, x5 cues for scapular mechanics and pacing Sidelying ER x10, x15 L GH cues for positioning Verbal HEP review Manual Therapy: PROM all directions to pt tolerance Rhythmic stabilizations 3x30sec long lever low amplitude  PATIENT EDUCATION: Education details: HEP update, POC discharge Person educated: Patient Education method: Explanation, Demonstration, Handout Education  comprehension: verbalized understanding, returned demonstration  HOME EXERCISE PROGRAM: Access Code: [email or web address]   ASSESSMENT: CLINICAL IMPRESSION: Patient tolerated therapy well with no adverse effects. She has made great progress in therapy and continues to demonstrate improvement in shoulder motion and strength. She has previously reported improvement in her functional ability, achieving her FOTO goal. Her primary remaining deficit is reaching behind her back but this is similar to her opposite side. Patient is independent with her HEP and feels confident in her ability to continue progressing her shoulder motion and strength, so she will be formally discharged at this time.   EVAL: Patient is a 63 y.o. female who was seen today for physical therapy evaluation and treatment for 2 weeks s/p reverse total shoulder arthroplasty. L TSA on 04/16/23 by Dr Rise Paganini. Pt demonstrates decreased Left shoulder AROM, strength and also some decreased AROM of R shoulder from previous surgery. She does not complain of pain on entering clinic but does have discomfort and  pain and difficulty with ADLs/IADLs, driving, sleep quality, and participation in normal daily activities especially yard work which she desires ot return.. She requires skilled PT services at this time to address all relevant deficits and return to PLOF   OBJECTIVE IMPAIRMENTS: decreased activity tolerance, decreased endurance, decreased ROM, decreased strength, impaired UE functional use, postural dysfunction, and pain.   ACTIVITY LIMITATIONS: carrying, lifting, sleeping, bathing, dressing, reach over head, and hygiene/grooming  PARTICIPATION LIMITATIONS: meal prep, cleaning, laundry, driving, shopping, community activity, and yard work  PERSONAL FACTORS: allergies, asthma, anemia, fibromyalgia, GERD, HLD, HTN, hypothyroidism, R TSA, bil TKA, basal cell face CA, R RTC before R TSA, bunion on R foot, hernia repair are also affecting  patient's functional outcome.    GOALS: Goals reviewed with patient? Yes  SHORT TERM GOALS: Target date: 05-31-23  Pt will be independent with initial HEP Baseline: 05/31/23: pt reports good performance of HEP overall Goal status: MET  2.  Pt pain level will not be greater than 4/10 with active motion Baseline: Pt post surgery 2 weeks, only isometric ex 05/31/23: no pain with movement, just heaviness Goal status: MET  3.  Pt will achieve 90 degrees flexion  AROM Baseline: NT due to post surgery eval 05/31/23: able to achieve >90 deg gravity eliminated AROM 06/29/23: see ROM chart above  Goal status: MET  LONG TERM GOALS: Target date: 08/10/2023  (updated 06/29/23)  Pt will be independent with advanced HEP Baseline: needs reinforcement has had R TSR reverse 06/29/23: reports good HEP adherence  07/26/2023: independent Goal status: MET  2.  FOTO will improve from 32%  to   54% indicating improved functional mobility.  Baseline: eval 32% 06/29/23: 63% Goal status: MET  3.  Left shoulder IR and ER will return to [facility] to return to 2/10 or less NPS for as dressing and grooming Baseline: Pt unable to dress without compensation and limited motion 06/29/23: reports return to basic ADLs without issue, does demo limitations in ROM as above with mild pain 07/26/2023: continues to be limited with reach behind back Goal status: PARTIALLY MET  4.  Pt will be able to  sleep on Left side without awaking due to pain Baseline: Pt post surgery 04-16-23 avoided lying on left side but now initial discomfort with lying on left side  06/29/23: sleeping on L shoulder without issue  Goal status: MET  5.  Left  shoulder AROM scaption will improve to minimally 0-140 degrees for improved overhead reaching Baseline: SEE AROM chart 06/29/23: see ROM chart above  07/26/2023: continues to exhibit limitation with overhead reach Goal status: NEARLY MET  6.  Patient will demonstrate ability to push/pull at least 40# with  <3/10 pain in order to return to heavy household and yardwork tasks.  Baseline: post op 04-16-23, only light weight such as coffee cup 06/29/23: not yet tested given proximity to surgery  07/26/2023: patient reports limitation with heavy activity Goal status: ONGOING   PLAN: PT FREQUENCY: 1-2x/week  PT DURATION: 6 weeks  PLANNED INTERVENTIONS: 97164- PT Re-evaluation, 97110-Therapeutic exercises, 97530- Therapeutic activity, 97112- Neuromuscular re-education, 97535- Self Care, 69629- Manual therapy, 97014- Electrical stimulation (unattended), [phone]- Electrical stimulation (manual), Patient/Family education, Taping, Dry Needling, Joint mobilization, Cryotherapy, and Moist heat  PLAN FOR NEXT SESSION: NA - discharge   Rosana Hoes, PT, DPT, LAT, ATC 07/26/23  1:01 PM Phone: [phone] Fax: [phone]    PHYSICAL THERAPY DISCHARGE SUMMARY  Visits from Start of Care: 20  Current functional level related to goals / functional outcomes: See above   Remaining deficits: See above   Education / Equipment: HEP   Patient agrees to discharge. Patient goals were partially met. Patient is being discharged due to being pleased with the current functional level.

## 2023-07-27 ENCOUNTER — Ambulatory Visit: Payer: Medicaid Other | Admitting: Podiatry

## 2023-07-27 ENCOUNTER — Encounter: Payer: Self-pay | Admitting: Podiatry

## 2023-07-27 ENCOUNTER — Ambulatory Visit (INDEPENDENT_AMBULATORY_CARE_PROVIDER_SITE_OTHER): Payer: Medicaid Other

## 2023-07-27 DIAGNOSIS — M2012 Hallux valgus (acquired), left foot: Secondary | ICD-10-CM

## 2023-07-27 DIAGNOSIS — M19072 Primary osteoarthritis, left ankle and foot: Secondary | ICD-10-CM | POA: Diagnosis not present

## 2023-07-27 NOTE — Progress Notes (Unsigned)
Subjective:  Patient ID: Denise Morales, female    DOB: 03-23-1961,  MRN: [id number]  Chief Complaint  Patient presents with   Foot Pain    1st MPJ left - bunion deformity x years, had the right one corrected 2023 and ready to get the left one done, also pain dorsal midfoot left as well     63 y.o. female presents with the above complaint.  Patient presents with follow-up now of painful left bunion deformity.  Patient states that has been going for years right is doing well.  She would like to get the left 1 done.  She is also having pain to the left dorsal midfoot she would like to discuss treatment options for that as well.  She would like to do a steroid injection while she is asleep  Review of Systems: Negative except as noted in the HPI. Denies N/V/F/Ch.  Past Medical History:  Diagnosis Date   Allergy    Anemia    Anxiety    Arthritis    Depression    Fibromyalgia    GERD (gastroesophageal reflux disease)    Heart murmur    History of hiatal hernia    Hyperlipidemia    Hypertension    Hypothyroidism    Melanoma (HCC)    face    Current Outpatient Medications:    acetaminophen (TYLENOL) 500 MG tablet, Take 500-1,000 mg by mouth every 6 (six) hours as needed (pain)., Disp: , Rfl:    busPIRone (BUSPAR) 7.5 MG tablet, Take 1 tablet (7.5 mg total) by mouth 2 (two) times daily., Disp: 60 tablet, Rfl: 1   celecoxib (CELEBREX) 100 MG capsule, Take 1 capsule (100 mg total) by mouth 2 (two) times daily., Disp: 30 capsule, Rfl: 0   dicyclomine (BENTYL) 10 MG capsule, Take 10 mg by mouth 4 (four) times daily as needed for spasms., Disp: , Rfl:    escitalopram (LEXAPRO) 20 MG tablet, Take 1 tablet by mouth once daily, Disp: 90 tablet, Rfl: 0   levothyroxine (SYNTHROID) 50 MCG tablet, Take 1 tablet (50 mcg total) by mouth daily., Disp: 90 tablet, Rfl: 3   lisinopril-hydrochlorothiazide (ZESTORETIC) 10-12.5 MG tablet, Take 1 tablet by mouth once daily, Disp: 90 tablet, Rfl: 0    methocarbamol (ROBAXIN) 500 MG tablet, Take 1 tablet (500 mg total) by mouth every 8 (eight) hours as needed for muscle spasms., Disp: 30 tablet, Rfl: 0   pravastatin (PRAVACHOL) 40 MG tablet, Take 1 tablet (40 mg total) by mouth daily. (Patient taking differently: Take 40 mg by mouth at bedtime.), Disp: 90 tablet, Rfl: 1   pregabalin (LYRICA) 75 MG capsule, Take 1 capsule by mouth twice daily, Disp: 120 capsule, Rfl: 0   QUEtiapine (SEROQUEL) 50 MG tablet, TAKE 1 TABLET BY MOUTH AT BEDTIME ON MONDAYS, WEDNESDAYS , FRIDAYS AND  SATURDAYS, Disp: 12 tablet, Rfl: 0   TURMERIC PO, Take 1,000 mg by mouth in the morning., Disp: , Rfl:    Vitamin D-Vitamin K (VITAMIN K2-VITAMIN D3 PO), Take 1 tablet by mouth in the morning. 100 mg/50 mcg, Disp: , Rfl:    zolpidem (AMBIEN) 5 MG tablet, TAKE 1 TABLET BY MOUTH EVERY OTHER NIGHT ALTERNATING WITH QUETIAPINE, Disp: 15 tablet, Rfl: 0  Social History   Tobacco Use  Smoking Status Never   Passive exposure: Past  Smokeless Tobacco Never    Allergies  Allergen Reactions   Hydroxyzine Other (See Comments)    hallucinations   Objective:  There were no vitals filed for  this visit. There is no height or weight on file to calculate BMI. Constitutional Well developed. Well nourished.  Vascular Dorsalis pedis pulses palpable bilaterally. Posterior tibial pulses palpable bilaterally. Capillary refill normal to all digits.  No cyanosis or clubbing noted. Pedal hair growth normal.  Neurologic Normal speech. Oriented to person, place, and time. Epicritic sensation to light touch grossly present bilaterally.  Dermatologic Nails well groomed and normal in appearance. No open wounds. No skin lesions.  Orthopedic: Normal joint ROM without pain or crepitus bilaterally. Hallux abductovalgus deformity present no intra-articular pain noted is a track bound not a tracking deformity. Left 1st MPJ diminished range of motion. Left 1st TMT with gross  hypermobility. Left dorsal midfoot pain at the midfoot joints pain on palpation to the dorsal midfoot Lisfranc joints Lesser digital contractures not present left   Radiographs: Taken and reviewed. Hallux abductovalgus deformity present. Severe bunion deformity noted to the right foot with hallux valgus increase in intermetatarsal angle.  Increase in hallux valgus angle.  No osteoarthritic changes noted at the first metatarsophalangeal joint.  Hammertoe contractures of second and third noted.  Lateral deviation slightly noted.  No osteoarthritic changes noted.  Some underlying met adductus present.  Metatarsal parabola within normal limits.  Assessment:   1. Hav (hallux abducto valgus), left   2. Arthritis of left midfoot    Plan:  Patient was evaluated and treated and all questions answered.  Hallux abductovalgus deformity, left side and left midfoot arthritis -XR as above. -Patient has failed all conservative therapy and wishes to proceed with surgical intervention. All risks, benefits, and alternatives discussed with patient. No guarantees given. Consent reviewed and signed by patient. Post-op course explained at length. -Planned procedures: Left Lapidus/Lapiplasty with possible phalangeal osteotomy with fixation and left steroid injection dorsal midfoot -Risk factors: Age -I explained the patient my preoperative intra postop plan in extensive detail she would like to proceed despite all the risks.  She will have to be nonweightbearing to the right foot for 3 weeks.  She states understanding. -Informed surgical risk consent was reviewed and read aloud to the patient.  I reviewed the films.  I have discussed my findings with the patient in great detail.  I have discussed all risks including but not limited to infection, stiffness, scarring, limp, disability, deformity, damage to blood vessels and nerves, numbness, poor healing, need for braces, arthritis, chronic pain, amputation, death.  All  benefits and realistic expectations discussed in great detail.  I have made no promises as to the outcome.  I have provided realistic expectations.  I have offered the patient a 2nd opinion, which they have declined and assured me they preferred to proceed despite the risks   No follow-ups on file.

## 2023-07-28 ENCOUNTER — Other Ambulatory Visit: Payer: Self-pay | Admitting: Family Medicine

## 2023-07-28 DIAGNOSIS — M75121 Complete rotator cuff tear or rupture of right shoulder, not specified as traumatic: Secondary | ICD-10-CM

## 2023-07-28 DIAGNOSIS — M21612 Bunion of left foot: Secondary | ICD-10-CM

## 2023-07-28 HISTORY — DX: Bunion of left foot: M21.612

## 2023-07-30 ENCOUNTER — Telehealth: Payer: Self-pay

## 2023-07-30 ENCOUNTER — Other Ambulatory Visit: Payer: Self-pay | Admitting: Orthopedic Surgery

## 2023-07-30 MED ORDER — CELECOXIB 100 MG PO CAPS: 100.0000 mg | ORAL_CAPSULE | Freq: Two times a day (BID) | ORAL | 0 refills | Status: DC

## 2023-07-30 NOTE — Telephone Encounter (Signed)
Copied from CRM [id number]. Topic: Clinical - Prescription Issue >> Jul 30, 2023  4:20 PM Geroge Baseman wrote: Reason for CRM: Needs celecoxib (CELEBREX) 100 MG capsule refilled, the pharmacy stated that the medication refill went to Dr August Saucer and it was denied. She said [employer] prescribed this to her and her name is on the bottle, not Dr. August Saucer. and she is unsure why the refill was sent over to Dr. August Saucer. Please call patient to advise.

## 2023-07-31 ENCOUNTER — Other Ambulatory Visit: Payer: Self-pay | Admitting: Family Medicine

## 2023-07-31 DIAGNOSIS — Z760 Encounter for issue of repeat prescription: Secondary | ICD-10-CM

## 2023-08-06 ENCOUNTER — Ambulatory Visit: Payer: Medicaid Other | Admitting: Family Medicine

## 2023-08-06 ENCOUNTER — Telehealth: Payer: Self-pay | Admitting: Family Medicine

## 2023-08-06 ENCOUNTER — Other Ambulatory Visit: Payer: Self-pay | Admitting: Family Medicine

## 2023-08-06 MED ORDER — CELECOXIB 100 MG PO CAPS
100.0000 mg | ORAL_CAPSULE | Freq: Two times a day (BID) | ORAL | 1 refills | Status: DC
Start: 2023-08-06 — End: 2023-08-09

## 2023-08-06 NOTE — Telephone Encounter (Signed)
 Copied from CRM [id number]. Topic: Clinical - Medication Question >> Aug 06, 2023 12:27 PM Karole Pacer C wrote: Reason for CRM: Patient is needing clarification on the following medication: celecoxib  (CELEBREX ) 100 MG capsule because the dosage amount has changed

## 2023-08-09 ENCOUNTER — Ambulatory Visit: Payer: Medicaid Other | Admitting: Family Medicine

## 2023-08-09 ENCOUNTER — Encounter: Payer: Self-pay | Admitting: Family Medicine

## 2023-08-09 VITALS — BP 138/92 | HR 81 | Temp 97.9°F | Ht 59.0 in | Wt 177.0 lb

## 2023-08-09 DIAGNOSIS — F339 Major depressive disorder, recurrent, unspecified: Secondary | ICD-10-CM | POA: Diagnosis not present

## 2023-08-09 DIAGNOSIS — I10 Essential (primary) hypertension: Secondary | ICD-10-CM | POA: Diagnosis not present

## 2023-08-09 MED ORDER — DICYCLOMINE HCL 10 MG PO CAPS: 10.0000 mg | ORAL_CAPSULE | Freq: Four times a day (QID) | ORAL | 0 refills | Status: DC | PRN

## 2023-08-09 MED ORDER — LISINOPRIL-HYDROCHLOROTHIAZIDE 20-12.5 MG PO TABS: 1.0000 | ORAL_TABLET | Freq: Every day | ORAL | 3 refills | Status: AC

## 2023-08-09 MED ORDER — CELECOXIB 100 MG PO CAPS: 100.0000 mg | ORAL_CAPSULE | Freq: Two times a day (BID) | ORAL | 1 refills | Status: AC

## 2023-08-09 NOTE — Assessment & Plan Note (Signed)
Increase to Lisinopril hydrochlorothiazide 20-12.5mg  daily. Recommend heart healthy diet such as Mediterranean diet with whole grains, fruits, vegetable, fish, lean meats, nuts, and olive oil. Limit salt. Encouraged moderate walking, 3-5 times/week for 30-50 minutes each session. Aim for at least 150 minutes.week. Goal should be pace of 3 miles/hours, or walking 1.5 miles in 30 minutes. Avoid tobacco products. Avoid excess alcohol. Take medications as prescribed and bring medications and blood pressure log with cuff to each office visit. Seek medical care for chest pain, palpitations, shortness of breath with exertion, dizziness/lightheadedness, vision changes, recurrent headaches, or swelling of extremities.

## 2023-08-09 NOTE — Assessment & Plan Note (Signed)
Stable on Lexapro 20mg  daily and Buspar 7.5mg  BID. Continue seeing therapist and discuss medication with psychiatry.

## 2023-08-09 NOTE — Progress Notes (Signed)
Subjective:  HPI: Denise Morales is a 63 y.o. female presenting on 08/09/2023 for Follow-up (Follow up from appt on 07/09/2023./)   HPI Patient is in today for blood pressure and anxiety/depression follow up. Home BP readings vary widely from 115/65 - 180/83 and average >130/80. She also reports her anxiety and depression are well controlled on lexapro and buspar with symptoms improved. She is continuing therapy and will visit psychiatry.  HYPERTENSION without Chronic Kidney Disease Hypertension status: stable  Satisfied with current treatment? yes Duration of hypertension: chronic BP monitoring frequency:  daily BP range: 115/65 - 180/83 BP medication side effects:  no Medication compliance: excellent compliance Previous BP meds:lisinopril HCTZ Aspirin: no Recurrent headaches: no Visual changes: no Palpitations: no Dyspnea: no Chest pain: no Lower extremity edema: no Dizzy/lightheaded: no     08/09/2023    3:49 PM 07/09/2023   11:48 AM 10/23/2022    3:17 PM 12/01/2020    2:49 PM  GAD 7 : Generalized Anxiety Score  Nervous, Anxious, on Edge 0 2 1 0  Control/stop worrying 0 2 0 0  Worry too much - different things 0 2 0 0  Trouble relaxing 0 2 1 0  Restless 0 0 1 0  Easily annoyed or irritable 1 0 0 0  Afraid - awful might happen 0 0 0 0  Total GAD 7 Score 1 8 3  0  Anxiety Difficulty  Somewhat difficult  Not difficult at all       08/09/2023    3:49 PM 07/09/2023   11:47 AM 06/11/2023    8:11 AM 10/23/2022    3:17 PM 12/01/2020    2:49 PM  Depression screen PHQ 2/9  Decreased Interest 0 3  2 0  Down, Depressed, Hopeless 0 1  0 1  PHQ - 2 Score 0 4  2 1   Altered sleeping 0 3  1   Tired, decreased energy 1 0  3   Change in appetite 0 0  0   Feeling bad or failure about yourself  0 3  0   Trouble concentrating 0 0  0   Moving slowly or fidgety/restless 0 3  0   Suicidal thoughts 0 0  0   PHQ-9 Score 1 13  6    Difficult doing work/chores  Somewhat difficult   Somewhat difficult      Information is confidential and restricted. Go to Review Flowsheets to unlock data.     Review of Systems  All other systems reviewed and are negative.   Relevant past medical history reviewed and updated as indicated.   Past Medical History:  Diagnosis Date   Allergy    Anemia    Anxiety    Arthritis    Depression    Fibromyalgia    GERD (gastroesophageal reflux disease)    Heart murmur    History of hiatal hernia    Hyperlipidemia    Hypertension    Hypothyroidism    Melanoma [facility])    face     Past Surgical History:  Procedure Laterality Date   ABDOMINAL HYSTERECTOMY  1985   AIKEN OSTEOTOMY Right 06/05/2022   Procedure: Quintella Reichert OSTEOTOMY;  Surgeon: Candelaria Stagers, DPM;  Location: [facility];  Service: Podiatry;  Laterality: Right;   BICEPT TENODESIS Right 12/05/2022   Procedure: RIGHT BICEPS TENODESIS;  Surgeon: Cammy Copa, MD;  Location: [facility] OR;  Service: Orthopedics;  Laterality: Right;   CAPSULOTOMY METATARSOPHALANGEAL Right 06/05/2022   Procedure: CAPSULOTOMY METATARSOPHALANGEAL;  Surgeon: Candelaria Stagers, DPM;  Location: [facility];  Service: Podiatry;  Laterality: Right;   CHOLECYSTECTOMY     COLONOSCOPY     HALLUX VALGUS LAPIDUS Right 06/05/2022   Procedure: HALLUX VALGUS LAPIDUS;  Surgeon: Candelaria Stagers, DPM;  Location: [facility];  Service: Podiatry;  Laterality: Right;   HAMMER TOE SURGERY Right 06/05/2022   Procedure: HAMMER TOE CORRECTION;  Surgeon: Candelaria Stagers, DPM;  Location: [facility];  Service: Podiatry;  Laterality: Right;   REVERSE SHOULDER ARTHROPLASTY Right 12/05/2022   Procedure: RIGHT REVERSE SHOULDER ARTHROPLASTY;  Surgeon: Cammy Copa, MD;  Location: [facility] OR;  Service: Orthopedics;  Laterality: Right;   REVERSE SHOULDER ARTHROPLASTY Left 04/16/2023   Procedure: REVERSE SHOULDER ARTHROPLASTY;  Surgeon: Cammy Copa, MD;  Location: [facility] OR;   Service: Orthopedics;  Laterality: Left;   SHOULDER ARTHROSCOPY WITH ROTATOR CUFF REPAIR AND SUBACROMIAL DECOMPRESSION Right 08/12/2020   Procedure: RIGHT SHOULDER ARTHROSCOPY WITH ROTATOR CUFF REPAIR AND SUBACROMIAL DECOMPRESSION;  Surgeon: Kathryne Hitch, MD;  Location: [facility];  Service: Orthopedics;  Laterality: Right;   TOTAL KNEE ARTHROPLASTY Right 12/24/2020   Procedure: RIGHT TOTAL KNEE ARTHROPLASTY;  Surgeon: Kathryne Hitch, MD;  Location: [facility];  Service: Orthopedics;  Laterality: Right;   TOTAL KNEE ARTHROPLASTY Left 07/15/2021   Procedure: LEFT TOTAL KNEE ARTHROPLASTY;  Surgeon: Kathryne Hitch, MD;  Location: [facility];  Service: Orthopedics;  Laterality: Left;   XI ROBOTIC ASSISTED PARAESOPHAGEAL HERNIA REPAIR N/A 03/12/2023   Procedure: XI ROBOTIC ASSISTED PARAESOPHAGEAL HERNIA REPAIR WITH FUNDOPLICATION;  Surgeon: Berna Bue, MD;  Location: [facility];  Service: General;  Laterality: N/A;  180    Allergies and medications reviewed and updated.   Current Outpatient Medications:    acetaminophen (TYLENOL) 500 MG tablet, Take 500-1,000 mg by mouth every 6 (six) hours as needed (pain)., Disp: , Rfl:    busPIRone (BUSPAR) 7.5 MG tablet, Take 1 tablet (7.5 mg total) by mouth 2 (two) times daily., Disp: 60 tablet, Rfl: 1   escitalopram (LEXAPRO) 20 MG tablet, Take 1 tablet by mouth once daily, Disp: 90 tablet, Rfl: 0   levothyroxine (SYNTHROID) 50 MCG tablet, Take 1 tablet (50 mcg total) by mouth daily., Disp: 90 tablet, Rfl: 3   lisinopril-hydrochlorothiazide (ZESTORETIC) 20-12.5 MG tablet, Take 1 tablet by mouth daily., Disp: 90 tablet, Rfl: 3   methocarbamol (ROBAXIN) 500 MG tablet, Take 1 tablet (500 mg total) by mouth every 8 (eight) hours as needed for muscle spasms., Disp: 30 tablet, Rfl: 0   pravastatin (PRAVACHOL) 40 MG tablet, Take 1 tablet (40 mg total) by mouth daily. (Patient taking differently: Take 40 mg by mouth at bedtime.),  Disp: 90 tablet, Rfl: 1   pregabalin (LYRICA) 75 MG capsule, Take 1 capsule by mouth twice daily, Disp: 120 capsule, Rfl: 0   QUEtiapine (SEROQUEL) 50 MG tablet, TAKE 1 TABLET BY MOUTH AT BEDTIME ON MONDAYS, WEDNESDAYS , FRIDAYS AND  SATURDAYS, Disp: 12 tablet, Rfl: 0   TURMERIC PO, Take 1,000 mg by mouth in the morning., Disp: , Rfl:    Vitamin D-Vitamin K (VITAMIN K2-VITAMIN D3 PO), Take 1 tablet by mouth in the morning. 100 mg/50 mcg, Disp: , Rfl:    zolpidem (AMBIEN) 5 MG tablet, TAKE 1 TABLET BY MOUTH EVERY OTHER NIGHT ALTERNATING WITH QUETIAPINE, Disp: 15 tablet, Rfl: 0   celecoxib (CELEBREX) 100 MG capsule, Take 1 capsule (100 mg total) by mouth 2 (two) times  daily., Disp: 180 capsule, Rfl: 1   dicyclomine (BENTYL) 10 MG capsule, Take 1 capsule (10 mg total) by mouth 4 (four) times daily as needed for spasms., Disp: 270 capsule, Rfl: 0  Allergies  Allergen Reactions   Hydroxyzine Other (See Comments)    hallucinations    Objective:   BP (!) 138/92   Pulse 81   Temp 97.9 F (36.6 C) (Oral)   Ht 4\' 11"  (1.499 m)   Wt 177 lb (80.3 kg)   SpO2 97%   BMI 35.75 kg/m      08/09/2023    3:06 PM 07/09/2023   11:15 AM 04/17/2023    9:51 AM  Vitals with BMI  Height 4\' 11"  4\' 11"    Weight 177 lbs 174 lbs   BMI 35.73 35.13   Systolic 138 130 409  Diastolic 92 90 61  Pulse 81 81 79     Physical Exam Vitals and nursing note reviewed.  Constitutional:      Appearance: Normal appearance. She is normal weight.  HENT:     Head: Normocephalic and atraumatic.  Cardiovascular:     Rate and Rhythm: Normal rate and regular rhythm.     Pulses: Normal pulses.     Heart sounds: Normal heart sounds.  Pulmonary:     Effort: Pulmonary effort is normal.     Breath sounds: Normal breath sounds.  Skin:    General: Skin is warm and dry.  Neurological:     General: No focal deficit present.     Mental Status: She is alert and oriented to person, place, and time. Mental status is at  baseline.  Psychiatric:        Mood and Affect: Mood normal.        Behavior: Behavior normal.        Thought Content: Thought content normal.        Judgment: Judgment normal.     Assessment & Plan:  Essential hypertension Assessment & Plan: Increase to Lisinopril hydrochlorothiazide 20-12.5mg  daily. Recommend heart healthy diet such as Mediterranean diet with whole grains, fruits, vegetable, fish, lean meats, nuts, and olive oil. Limit salt. Encouraged moderate walking, 3-5 times/week for 30-50 minutes each session. Aim for at least 150 minutes.week. Goal should be pace of 3 miles/hours, or walking 1.5 miles in 30 minutes. Avoid tobacco products. Avoid excess alcohol. Take medications as prescribed and bring medications and blood pressure log with cuff to each office visit. Seek medical care for chest pain, palpitations, shortness of breath with exertion, dizziness/lightheadedness, vision changes, recurrent headaches, or swelling of extremities.    Depression, recurrent (HCC) Assessment & Plan: Stable on Lexapro 20mg  daily and Buspar 7.5mg  BID. Continue seeing therapist and discuss medication with psychiatry.    Other orders -     Celecoxib; Take 1 capsule (100 mg total) by mouth 2 (two) times daily.  Dispense: 180 capsule; Refill: 1 -     Dicyclomine HCl; Take 1 capsule (10 mg total) by mouth 4 (four) times daily as needed for spasms.  Dispense: 270 capsule; Refill: 0 -     Lisinopril-hydroCHLOROthiazide; Take 1 tablet by mouth daily.  Dispense: 90 tablet; Refill: 3     Follow up plan: Return in about 3 weeks (around 08/30/2023) for hypertension, anxiety/depression.  Park Meo, FNP

## 2023-08-13 ENCOUNTER — Other Ambulatory Visit: Payer: Self-pay | Admitting: Family Medicine

## 2023-08-13 ENCOUNTER — Encounter: Payer: Self-pay | Admitting: Family Medicine

## 2023-08-16 ENCOUNTER — Telehealth: Payer: Self-pay | Admitting: Podiatry

## 2023-08-16 NOTE — Telephone Encounter (Signed)
DOS-09/03/23  LAPIDUS BUNIONECTOMY [id number] STEROID INJECTION LT- [id number]  [facility] EFFECTIVE DATE- 08/25/22  PER AN INCOMING FAX FROM [facility], PRIOR AUTH IS NOT REQUIRED FOR CPT CODE 32355. REF #Charline Bills- [id number]  PER THE [facility], PRIOR AUTH HAS BEEN APPROVED FOR CPT CODE 27062, GOOD FROM 09/03/23-11/01/23.  AUTH REF #: [id number]

## 2023-08-20 ENCOUNTER — Ambulatory Visit (INDEPENDENT_AMBULATORY_CARE_PROVIDER_SITE_OTHER): Payer: Medicaid Other | Admitting: Clinical

## 2023-08-20 DIAGNOSIS — F331 Major depressive disorder, recurrent, moderate: Secondary | ICD-10-CM | POA: Diagnosis not present
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
## 2023-08-27 ENCOUNTER — Other Ambulatory Visit: Payer: Self-pay

## 2023-08-27 ENCOUNTER — Encounter
Admission: RE | Admit: 2023-08-27 | Discharge: 2023-08-27 | Disposition: A | Discharge status: Home / Self Care | Payer: Medicaid Other | Source: Ambulatory Visit | Attending: Podiatry | Admitting: Podiatry

## 2023-08-27 VITALS — BP 112/78 | HR 80 | Temp 97.5°F | Resp 14 | Ht <= 58 in | Wt 178.0 lb

## 2023-08-27 DIAGNOSIS — Z01818 Encounter for other preprocedural examination: Secondary | ICD-10-CM | POA: Insufficient documentation

## 2023-08-27 DIAGNOSIS — Z01810 Encounter for preprocedural cardiovascular examination: Secondary | ICD-10-CM

## 2023-08-27 DIAGNOSIS — I10 Essential (primary) hypertension: Secondary | ICD-10-CM | POA: Diagnosis not present

## 2023-08-27 DIAGNOSIS — Z01812 Encounter for preprocedural laboratory examination: Secondary | ICD-10-CM

## 2023-08-27 HISTORY — DX: Essential (primary) hypertension: I10

## 2023-08-27 HISTORY — DX: Iron deficiency anemia, unspecified: D50.9

## 2023-08-27 HISTORY — DX: Prediabetes: R73.03

## 2023-08-27 LAB — BASIC METABOLIC PANEL
Anion gap: 8 (ref 5–15)
BUN: 20 mg/dL (ref 8–23)
CO2: 26 mmol/L (ref 22–32)
Calcium: 9.1 mg/dL (ref 8.9–10.3)
Chloride: 106 mmol/L (ref 98–111)
Creatinine, Ser: 0.96 mg/dL (ref 0.44–1.00)
GFR, Estimated: >60 mL/min (ref >60)
Glucose, Bld: 116 mg/dL — ABNORMAL HIGH (ref 70–99)
Potassium: 3.8 mmol/L (ref 3.5–5.1)
Sodium: 140 mmol/L (ref 135–145)

## 2023-08-27 NOTE — Patient Instructions (Addendum)
 Your procedure is scheduled on: Monday, March 10 Report to the Registration Desk on the 1st floor of the [facility] To find out your arrival time, please call [phone] between 1PM - 3PM on: Friday, March 7 If your arrival time is 6:00 am, do not arrive before that time as the [facility] entrance doors do not open until 6:00 am.  REMEMBER: Instructions that are not followed completely may result in serious medical risk, up to and including death; or upon the discretion of your surgeon and anesthesiologist your surgery may need to be rescheduled.  Do not eat food after midnight the night before surgery.  No gum chewing or hard candies.  You may however, drink CLEAR liquids up to 2 hours before you are scheduled to arrive for your surgery. Do not drink anything within 2 hours of your scheduled arrival time.  Clear liquids include: - water  - apple juice without pulp - gatorade (not RED colors) - black coffee or tea (Do NOT add milk or creamers to the coffee or tea) Do NOT drink anything that is not on this list.  One week prior to surgery: starting March 3 Stop Anti-inflammatories (NSAIDS) such as Advil, Aleve, Ibuprofen, Motrin, Naproxen, Naprosyn and Aspirin based products such as Excedrin, Goody's Powder, BC Powder. Stop ANY OVER THE COUNTER supplements until after surgery. Stop vitamin D,K, turmeric, Krill oil.  You may however, continue to take Tylenol if needed for pain up until the day of surgery.  Continue taking all of your other prescription medications up until the day of surgery.  ON THE DAY OF SURGERY ONLY TAKE THESE MEDICATIONS WITH SIPS OF WATER:  busPIRone (BUSPAR)  levothyroxine (SYNTHROID)  pregabalin (LYRICA)   No Alcohol for 24 hours before or after surgery.  No Smoking including e-cigarettes for 24 hours before surgery.  No chewable tobacco products for at least 6 hours before surgery.  No nicotine patches on the day of surgery.  Do not use any  "recreational" drugs for at least a week (preferably 2 weeks) before your surgery.  Please be advised that the combination of cocaine and anesthesia may have negative outcomes, up to and including death. If you test positive for cocaine, your surgery will be cancelled.  On the morning of surgery brush your teeth with toothpaste and water, you may rinse your mouth with mouthwash if you wish. Do not swallow any toothpaste or mouthwash.  Use CHG Soap as directed on instruction sheet.  Do not wear jewelry, make-up, hairpins, clips or nail polish.  For welded (permanent) jewelry: bracelets, anklets, waist bands, etc.  Please have this removed prior to surgery.  If it is not removed, there is a chance that hospital personnel will need to cut it off on the day of surgery.  Do not wear lotions, powders, or perfumes.   Do not shave body hair from the neck down 48 hours before surgery.  Contact lenses, hearing aids and dentures may not be worn into surgery.  Do not bring valuables to the hospital. [facility] is not responsible for any missing/lost belongings or valuables.   Notify your doctor if there is any change in your medical condition (cold, fever, infection).  Wear comfortable clothing (specific to your surgery type) to the hospital.  After surgery, you can help prevent lung complications by doing breathing exercises.  Take deep breaths and cough every 1-2 hours.   If you are being discharged the day of surgery, you will not be allowed  to drive home. You will need a responsible individual to drive you home and stay with you for 24 hours after surgery.   If you are taking public transportation, you will need to have a responsible individual with you.  Please call the Pre-admissions Testing Dept. at [phone] if you have any questions about these instructions.  Surgery Visitation Policy:  Patients having surgery or a procedure may have two visitors.  Children under the age of  41 must have an adult with them who is not the patient.  Temporary Visitor Restrictions Due to increasing cases of flu, RSV and COVID-19: Children ages 1 and under will not be able to visit patients in [facility] hospitals under most circumstances.      Preparing for Surgery with CHLORHEXIDINE GLUCONATE (CHG) Soap  Chlorhexidine Gluconate (CHG) Soap  o An antiseptic cleaner that kills germs and bonds with the skin to continue killing germs even after washing  o Used for showering the night before surgery and morning of surgery  Before surgery, you can play an important role by reducing the number of germs on your skin.  CHG (Chlorhexidine gluconate) soap is an antiseptic cleanser which kills germs and bonds with the skin to continue killing germs even after washing.  Please do not use if you have an allergy to CHG or antibacterial soaps. If your skin becomes reddened/irritated stop using the CHG.  1. Shower the NIGHT BEFORE SURGERY and the MORNING OF SURGERY with CHG soap.  2. If you choose to wash your hair, wash your hair first as usual with your normal shampoo.  3. After shampooing, rinse your hair and body thoroughly to remove the shampoo.  4. Use CHG as you would any other liquid soap. You can apply CHG directly to the skin and wash gently with a scrungie or a clean washcloth.  5. Apply the CHG soap to your body only from the neck down. Do not use on open wounds or open sores. Avoid contact with your eyes, ears, mouth, and genitals (private parts). Wash face and genitals (private parts) with your normal soap.  6. Wash thoroughly, paying special attention to the area where your surgery will be performed.  7. Thoroughly rinse your body with warm water.  8. Do not shower/wash with your normal soap after using and rinsing off the CHG soap.  9. Pat yourself dry with a clean towel.  10. Wear clean pajamas to bed the night before surgery.  12. Place clean sheets on your bed  the night of your first shower and do not sleep with pets.  13. Shower again with the CHG soap on the day of surgery prior to arriving at the hospital.  14. Do not apply any deodorants/lotions/powders.  15. Please wear clean clothes to the hospital.

## 2023-08-30 ENCOUNTER — Ambulatory Visit: Payer: Medicaid Other | Admitting: Family Medicine

## 2023-08-30 ENCOUNTER — Other Ambulatory Visit: Payer: Self-pay | Admitting: Family Medicine

## 2023-08-30 ENCOUNTER — Encounter: Payer: Self-pay | Admitting: Family Medicine

## 2023-08-30 VITALS — BP 113/79 | HR 81 | Temp 97.7°F | Ht <= 58 in | Wt 177.0 lb

## 2023-08-30 DIAGNOSIS — I10 Essential (primary) hypertension: Secondary | ICD-10-CM

## 2023-08-30 DIAGNOSIS — F339 Major depressive disorder, recurrent, unspecified: Secondary | ICD-10-CM

## 2023-08-30 NOTE — Progress Notes (Signed)
 Subjective:  HPI: Denise Morales is a 63 y.o. female presenting on 08/30/2023 for Follow-up (3 weeks (around 08/30/2023) for hypertension, anxiety/depression - JBG\\\)   HPI Patient is in today for blood pressure follow-up. Her home readings are improved. She does have a planned bunionectomy scheduled for Monday. She continues to see her therapist and is seeing a psychiatrist soon for her depression, this is well controlled on current regimen.  HYPERTENSION without Chronic Kidney Disease Hypertension status: controlled  Satisfied with current treatment? yes Duration of hypertension: chronic BP monitoring frequency:  daily BP range: 126/67 - 168/74 BP medication side effects:  no Medication compliance: excellent compliance Previous BP meds:lisinopril-HCTZ Aspirin: no Recurrent headaches: no Visual changes: no Palpitations: no Dyspnea: no Chest pain: no Lower extremity edema: no Dizzy/lightheaded: no     08/09/2023    3:49 PM 07/09/2023   11:48 AM 10/23/2022    3:17 PM 12/01/2020    2:49 PM  GAD 7 : Generalized Anxiety Score  Nervous, Anxious, on Edge 0 2 1 0  Control/stop worrying 0 2 0 0  Worry too much - different things 0 2 0 0  Trouble relaxing 0 2 1 0  Restless 0 0 1 0  Easily annoyed or irritable 1 0 0 0  Afraid - awful might happen 0 0 0 0  Total GAD 7 Score 1 8 3  0  Anxiety Difficulty  Somewhat difficult  Not difficult at all       08/09/2023    3:49 PM 07/09/2023   11:47 AM 06/11/2023    8:11 AM 10/23/2022    3:17 PM 12/01/2020    2:49 PM  Depression screen PHQ 2/9  Decreased Interest 0 3  2 0  Down, Depressed, Hopeless 0 1  0 1  PHQ - 2 Score 0 4  2 1   Altered sleeping 0 3  1   Tired, decreased energy 1 0  3   Change in appetite 0 0  0   Feeling bad or failure about yourself  0 3  0   Trouble concentrating 0 0  0   Moving slowly or fidgety/restless 0 3  0   Suicidal thoughts 0 0  0   PHQ-9 Score 1 13  6    Difficult doing work/chores  Somewhat difficult   Somewhat difficult      Information is confidential and restricted. Go to Review Flowsheets to unlock data.     Review of Systems  All other systems reviewed and are negative.   Relevant past medical history reviewed and updated as indicated.   Past Medical History:  Diagnosis Date   Allergy    Anemia    Anxiety    Arthritis    Bunion of left foot 07/2023   Depression    Essential hypertension    Fibromyalgia    GERD (gastroesophageal reflux disease)    Heart murmur    History of hiatal hernia    Hyperlipidemia    Hypothyroidism    Iron deficiency anemia    Melanoma (HCC)    face   Pre-diabetes      Past Surgical History:  Procedure Laterality Date   ABDOMINAL HYSTERECTOMY  1985   AIKEN OSTEOTOMY Right 06/05/2022   Procedure: Quintella Reichert OSTEOTOMY;  Surgeon: Candelaria Stagers, DPM;  Location: [facility];  Service: Podiatry;  Laterality: Right;   BICEPT TENODESIS Right 12/05/2022   Procedure: RIGHT BICEPS TENODESIS;  Surgeon: Cammy Copa, MD;  Location: [facility] OR;  Service: Orthopedics;  Laterality: Right;   CAPSULOTOMY METATARSOPHALANGEAL Right 06/05/2022   Procedure: CAPSULOTOMY METATARSOPHALANGEAL;  Surgeon: Candelaria Stagers, DPM;  Location: [facility];  Service: Podiatry;  Laterality: Right;   CHOLECYSTECTOMY     COLONOSCOPY     COLONOSCOPY WITH ESOPHAGOGASTRODUODENOSCOPY (EGD)  12/2022   HALLUX VALGUS LAPIDUS Right 06/05/2022   Procedure: HALLUX VALGUS LAPIDUS;  Surgeon: Candelaria Stagers, DPM;  Location: [facility];  Service: Podiatry;  Laterality: Right;   HAMMER TOE SURGERY Right 06/05/2022   Procedure: HAMMER TOE CORRECTION;  Surgeon: Candelaria Stagers, DPM;  Location: [facility];  Service: Podiatry;  Laterality: Right;   REVERSE SHOULDER ARTHROPLASTY Right 12/05/2022   Procedure: RIGHT REVERSE SHOULDER ARTHROPLASTY;  Surgeon: Cammy Copa, MD;  Location: [facility] OR;  Service: Orthopedics;  Laterality:  Right;   REVERSE SHOULDER ARTHROPLASTY Left 04/16/2023   Procedure: REVERSE SHOULDER ARTHROPLASTY;  Surgeon: Cammy Copa, MD;  Location: [facility] OR;  Service: Orthopedics;  Laterality: Left;   SHOULDER ARTHROSCOPY WITH ROTATOR CUFF REPAIR AND SUBACROMIAL DECOMPRESSION Right 08/12/2020   Procedure: RIGHT SHOULDER ARTHROSCOPY WITH ROTATOR CUFF REPAIR AND SUBACROMIAL DECOMPRESSION;  Surgeon: Kathryne Hitch, MD;  Location: [facility];  Service: Orthopedics;  Laterality: Right;   TOTAL KNEE ARTHROPLASTY Right 12/24/2020   Procedure: RIGHT TOTAL KNEE ARTHROPLASTY;  Surgeon: Kathryne Hitch, MD;  Location: [facility];  Service: Orthopedics;  Laterality: Right;   TOTAL KNEE ARTHROPLASTY Left 07/15/2021   Procedure: LEFT TOTAL KNEE ARTHROPLASTY;  Surgeon: Kathryne Hitch, MD;  Location: [facility];  Service: Orthopedics;  Laterality: Left;   XI ROBOTIC ASSISTED PARAESOPHAGEAL HERNIA REPAIR N/A 03/12/2023   Procedure: XI ROBOTIC ASSISTED PARAESOPHAGEAL HERNIA REPAIR WITH FUNDOPLICATION;  Surgeon: Berna Bue, MD;  Location: [facility];  Service: General;  Laterality: N/A;  180    Allergies and medications reviewed and updated.   Current Outpatient Medications:    acetaminophen (TYLENOL) 500 MG tablet, Take 500-1,000 mg by mouth See admin instructions. Take 2 tablets (1000 mg) by mouth scheduled at bedtime & every 6 hours if needed for pain., Disp: , Rfl:    busPIRone (BUSPAR) 7.5 MG tablet, Take 1 tablet (7.5 mg total) by mouth 2 (two) times daily., Disp: 60 tablet, Rfl: 1   celecoxib (CELEBREX) 100 MG capsule, Take 1 capsule (100 mg total) by mouth 2 (two) times daily., Disp: 180 capsule, Rfl: 1   dicyclomine (BENTYL) 10 MG capsule, Take 1 capsule (10 mg total) by mouth 4 (four) times daily as needed for spasms. (Patient taking differently: Take 10 mg by mouth in the morning and at bedtime.), Disp: 270 capsule, Rfl: 0   escitalopram (LEXAPRO) 20 MG tablet, Take 1  tablet by mouth once daily (Patient taking differently: Take 20 mg by mouth at bedtime.), Disp: 90 tablet, Rfl: 0   KRILL OIL PO, Take 500 mg by mouth in the morning., Disp: , Rfl:    levothyroxine (SYNTHROID) 50 MCG tablet, Take 1 tablet (50 mcg total) by mouth daily., Disp: 90 tablet, Rfl: 3   lisinopril-hydrochlorothiazide (ZESTORETIC) 20-12.5 MG tablet, Take 1 tablet by mouth daily., Disp: 90 tablet, Rfl: 3   pravastatin (PRAVACHOL) 40 MG tablet, Take 1 tablet (40 mg total) by mouth daily. (Patient taking differently: Take 40 mg by mouth at bedtime.), Disp: 90 tablet, Rfl: 1   pregabalin (LYRICA) 75 MG capsule, Take 1 capsule by mouth twice daily, Disp: 120 capsule, Rfl: 0   QUEtiapine (SEROQUEL) 50  MG tablet, TAKE 1 TABLET BY MOUTH AT BEDTIME ON MONDAYS, WEDNESDAYS , FRIDAYS AND  SATURDAYS, Disp: 12 tablet, Rfl: 0   TURMERIC PO, Take 1,500 mg by mouth in the morning., Disp: , Rfl:    Vitamin D-Vitamin K (VITAMIN K2-VITAMIN D3 PO), Take 1 tablet by mouth in the morning. 100 mg/50 mcg, Disp: , Rfl:    zolpidem (AMBIEN) 5 MG tablet, TAKE 1 TABLET BY MOUTH EVERY OTHER NIGHT ALTERNATING WITH QUETIAPINE, Disp: 15 tablet, Rfl: 0  Allergies  Allergen Reactions   Hydroxyzine Other (See Comments)    hallucinations    Objective:   BP 113/79 Comment: pt's home reading  Pulse 81   Temp 97.7 F (36.5 C) (Oral)   Ht 4\' 10"  (1.473 m)   Wt 177 lb (80.3 kg)   SpO2 98%   BMI 36.99 kg/m      08/30/2023    8:08 AM 08/30/2023    8:01 AM 08/27/2023    9:11 AM  Vitals with BMI  Height  4\' 10"  4\' 10"   Weight  177 lbs 178 lbs  BMI  37 37.21  Systolic 113 120 161  Diastolic 79 82 78  Pulse  81 80     Physical Exam Vitals and nursing note reviewed.  Constitutional:      Appearance: Normal appearance. She is normal weight.  HENT:     Head: Normocephalic and atraumatic.  Cardiovascular:     Rate and Rhythm: Normal rate and regular rhythm.     Pulses: Normal pulses.     Heart sounds: Normal heart  sounds.  Pulmonary:     Effort: Pulmonary effort is normal.     Breath sounds: Normal breath sounds.  Skin:    General: Skin is warm and dry.  Neurological:     General: No focal deficit present.     Mental Status: She is alert and oriented to person, place, and time. Mental status is at baseline.  Psychiatric:        Mood and Affect: Mood normal.        Behavior: Behavior normal.        Thought Content: Thought content normal.        Judgment: Judgment normal.     Assessment & Plan:  There are no diagnoses linked to this encounter.   Follow up plan: No follow-ups on file.  Park Meo, FNP

## 2023-08-30 NOTE — Assessment & Plan Note (Signed)
 Chronic controlled on Lisinopril hydrochlorothiazide 20-12.5mg  daily. Continue heart healthy diet such as Mediterranean diet with whole grains, fruits, vegetable, fish, lean meats, nuts, and olive oil. Limit salt. Encouraged moderate walking, 3-5 times/week for 30-50 minutes each session. Aim for at least 150 minutes.week. Goal should be pace of 3 miles/hours, or walking 1.5 miles in 30 minutes. Avoid tobacco products. Avoid excess alcohol. Take medications as prescribed and bring medications and blood pressure log with cuff to each office visit. Seek medical care for chest pain, palpitations, shortness of breath with exertion, dizziness/lightheadedness, vision changes, recurrent headaches, or swelling of extremities. Follow up in 3 months.

## 2023-08-30 NOTE — H&P (View-Only) (Signed)
 Subjective:  HPI: Denise Morales is a 63 y.o. female presenting on 08/30/2023 for Follow-up (3 weeks (around 08/30/2023) for hypertension, anxiety/depression - JBG\\\)   HPI Patient is in today for blood pressure follow-up. Her home readings are improved. She does have a planned bunionectomy scheduled for Monday. She continues to see her therapist and is seeing a psychiatrist soon for her depression, this is well controlled on current regimen.  HYPERTENSION without Chronic Kidney Disease Hypertension status: controlled  Satisfied with current treatment? yes Duration of hypertension: chronic BP monitoring frequency:  daily BP range: 126/67 - 168/74 BP medication side effects:  no Medication compliance: excellent compliance Previous BP meds:lisinopril-HCTZ Aspirin: no Recurrent headaches: no Visual changes: no Palpitations: no Dyspnea: no Chest pain: no Lower extremity edema: no Dizzy/lightheaded: no     08/09/2023    3:49 PM 07/09/2023   11:48 AM 10/23/2022    3:17 PM 12/01/2020    2:49 PM  GAD 7 : Generalized Anxiety Score  Nervous, Anxious, on Edge 0 2 1 0  Control/stop worrying 0 2 0 0  Worry too much - different things 0 2 0 0  Trouble relaxing 0 2 1 0  Restless 0 0 1 0  Easily annoyed or irritable 1 0 0 0  Afraid - awful might happen 0 0 0 0  Total GAD 7 Score 1 8 3  0  Anxiety Difficulty  Somewhat difficult  Not difficult at all       08/09/2023    3:49 PM 07/09/2023   11:47 AM 06/11/2023    8:11 AM 10/23/2022    3:17 PM 12/01/2020    2:49 PM  Depression screen PHQ 2/9  Decreased Interest 0 3  2 0  Down, Depressed, Hopeless 0 1  0 1  PHQ - 2 Score 0 4  2 1   Altered sleeping 0 3  1   Tired, decreased energy 1 0  3   Change in appetite 0 0  0   Feeling bad or failure about yourself  0 3  0   Trouble concentrating 0 0  0   Moving slowly or fidgety/restless 0 3  0   Suicidal thoughts 0 0  0   PHQ-9 Score 1 13  6    Difficult doing work/chores  Somewhat difficult   Somewhat difficult      Information is confidential and restricted. Go to Review Flowsheets to unlock data.     Review of Systems  All other systems reviewed and are negative.   Relevant past medical history reviewed and updated as indicated.   Past Medical History:  Diagnosis Date   Allergy    Anemia    Anxiety    Arthritis    Bunion of left foot 07/2023   Depression    Essential hypertension    Fibromyalgia    GERD (gastroesophageal reflux disease)    Heart murmur    History of hiatal hernia    Hyperlipidemia    Hypothyroidism    Iron deficiency anemia    Melanoma (HCC)    face   Pre-diabetes      Past Surgical History:  Procedure Laterality Date   ABDOMINAL HYSTERECTOMY  1985   AIKEN OSTEOTOMY Right 06/05/2022   Procedure: Quintella Reichert OSTEOTOMY;  Surgeon: Candelaria Stagers, DPM;  Location: [facility];  Service: Podiatry;  Laterality: Right;   BICEPT TENODESIS Right 12/05/2022   Procedure: RIGHT BICEPS TENODESIS;  Surgeon: Cammy Copa, MD;  Location: [facility];  Service: Orthopedics;  Laterality: Right;   CAPSULOTOMY METATARSOPHALANGEAL Right 06/05/2022   Procedure: CAPSULOTOMY METATARSOPHALANGEAL;  Surgeon: Candelaria Stagers, DPM;  Location: [facility];  Service: Podiatry;  Laterality: Right;   CHOLECYSTECTOMY     COLONOSCOPY     COLONOSCOPY WITH ESOPHAGOGASTRODUODENOSCOPY (EGD)  12/2022   HALLUX VALGUS LAPIDUS Right 06/05/2022   Procedure: HALLUX VALGUS LAPIDUS;  Surgeon: Candelaria Stagers, DPM;  Location: [facility];  Service: Podiatry;  Laterality: Right;   HAMMER TOE SURGERY Right 06/05/2022   Procedure: HAMMER TOE CORRECTION;  Surgeon: Candelaria Stagers, DPM;  Location: [facility] [place];  Service: Podiatry;  Laterality: Right;   REVERSE SHOULDER ARTHROPLASTY Right 12/05/2022   Procedure: RIGHT REVERSE SHOULDER ARTHROPLASTY;  Surgeon: Cammy Copa, MD;  Location: [facility] OR;  Service: Orthopedics;  Laterality:  Right;   REVERSE SHOULDER ARTHROPLASTY Left 04/16/2023   Procedure: REVERSE SHOULDER ARTHROPLASTY;  Surgeon: Cammy Copa, MD;  Location: [facility] OR;  Service: Orthopedics;  Laterality: Left;   SHOULDER ARTHROSCOPY WITH ROTATOR CUFF REPAIR AND SUBACROMIAL DECOMPRESSION Right 08/12/2020   Procedure: RIGHT SHOULDER ARTHROSCOPY WITH ROTATOR CUFF REPAIR AND SUBACROMIAL DECOMPRESSION;  Surgeon: Kathryne Hitch, MD;  Location: [facility];  Service: Orthopedics;  Laterality: Right;   TOTAL KNEE ARTHROPLASTY Right 12/24/2020   Procedure: RIGHT TOTAL KNEE ARTHROPLASTY;  Surgeon: Kathryne Hitch, MD;  Location: [facility];  Service: Orthopedics;  Laterality: Right;   TOTAL KNEE ARTHROPLASTY Left 07/15/2021   Procedure: LEFT TOTAL KNEE ARTHROPLASTY;  Surgeon: Kathryne Hitch, MD;  Location: [facility];  Service: Orthopedics;  Laterality: Left;   XI ROBOTIC ASSISTED PARAESOPHAGEAL HERNIA REPAIR N/A 03/12/2023   Procedure: XI ROBOTIC ASSISTED PARAESOPHAGEAL HERNIA REPAIR WITH FUNDOPLICATION;  Surgeon: Berna Bue, MD;  Location: [facility];  Service: General;  Laterality: N/A;  180    Allergies and medications reviewed and updated.   Current Outpatient Medications:    acetaminophen (TYLENOL) 500 MG tablet, Take 500-1,000 mg by mouth See admin instructions. Take 2 tablets (1000 mg) by mouth scheduled at bedtime & every 6 hours if needed for pain., Disp: , Rfl:    busPIRone (BUSPAR) 7.5 MG tablet, Take 1 tablet (7.5 mg total) by mouth 2 (two) times daily., Disp: 60 tablet, Rfl: 1   celecoxib (CELEBREX) 100 MG capsule, Take 1 capsule (100 mg total) by mouth 2 (two) times daily., Disp: 180 capsule, Rfl: 1   dicyclomine (BENTYL) 10 MG capsule, Take 1 capsule (10 mg total) by mouth 4 (four) times daily as needed for spasms. (Patient taking differently: Take 10 mg by mouth in the morning and at bedtime.), Disp: 270 capsule, Rfl: 0   escitalopram (LEXAPRO) 20 MG tablet, Take 1  tablet by mouth once daily (Patient taking differently: Take 20 mg by mouth at bedtime.), Disp: 90 tablet, Rfl: 0   KRILL OIL PO, Take 500 mg by mouth in the morning., Disp: , Rfl:    levothyroxine (SYNTHROID) 50 MCG tablet, Take 1 tablet (50 mcg total) by mouth daily., Disp: 90 tablet, Rfl: 3   lisinopril-hydrochlorothiazide (ZESTORETIC) 20-12.5 MG tablet, Take 1 tablet by mouth daily., Disp: 90 tablet, Rfl: 3   pravastatin (PRAVACHOL) 40 MG tablet, Take 1 tablet (40 mg total) by mouth daily. (Patient taking differently: Take 40 mg by mouth at bedtime.), Disp: 90 tablet, Rfl: 1   pregabalin (LYRICA) 75 MG capsule, Take 1 capsule by mouth twice daily, Disp: 120 capsule, Rfl: 0   QUEtiapine (SEROQUEL) 50  MG tablet, TAKE 1 TABLET BY MOUTH AT BEDTIME ON MONDAYS, WEDNESDAYS , FRIDAYS AND  SATURDAYS, Disp: 12 tablet, Rfl: 0   TURMERIC PO, Take 1,500 mg by mouth in the morning., Disp: , Rfl:    Vitamin D-Vitamin K (VITAMIN K2-VITAMIN D3 PO), Take 1 tablet by mouth in the morning. 100 mg/50 mcg, Disp: , Rfl:    zolpidem (AMBIEN) 5 MG tablet, TAKE 1 TABLET BY MOUTH EVERY OTHER NIGHT ALTERNATING WITH QUETIAPINE, Disp: 15 tablet, Rfl: 0  Allergies  Allergen Reactions   Hydroxyzine Other (See Comments)    hallucinations    Objective:   BP 113/79 Comment: pt's home reading  Pulse 81   Temp 97.7 F (36.5 C) (Oral)   Ht 4\' 10"  (1.473 m)   Wt 177 lb (80.3 kg)   SpO2 98%   BMI 36.99 kg/m      08/30/2023    8:08 AM 08/30/2023    8:01 AM 08/27/2023    9:11 AM  Vitals with BMI  Height  4\' 10"  4\' 10"   Weight  177 lbs 178 lbs  BMI  37 37.21  Systolic 113 120 161  Diastolic 79 82 78  Pulse  81 80     Physical Exam Vitals and nursing note reviewed.  Constitutional:      Appearance: Normal appearance. She is normal weight.  HENT:     Head: Normocephalic and atraumatic.  Cardiovascular:     Rate and Rhythm: Normal rate and regular rhythm.     Pulses: Normal pulses.     Heart sounds: Normal heart  sounds.  Pulmonary:     Effort: Pulmonary effort is normal.     Breath sounds: Normal breath sounds.  Skin:    General: Skin is warm and dry.  Neurological:     General: No focal deficit present.     Mental Status: She is alert and oriented to person, place, and time. Mental status is at baseline.  Psychiatric:        Mood and Affect: Mood normal.        Behavior: Behavior normal.        Thought Content: Thought content normal.        Judgment: Judgment normal.     Assessment & Plan:  There are no diagnoses linked to this encounter.   Follow up plan: No follow-ups on file.  Park Meo, FNP

## 2023-08-30 NOTE — Assessment & Plan Note (Addendum)
 Well controlled on Lexapro 20mg  daily. Continue current regimen. Seeing therapist and psychiatry. Denies SI/HI.  Follow up in 3 months.

## 2023-09-02 ENCOUNTER — Other Ambulatory Visit: Payer: Self-pay | Admitting: Family Medicine

## 2023-09-03 ENCOUNTER — Ambulatory Visit: Admitting: Anesthesiology

## 2023-09-03 ENCOUNTER — Ambulatory Visit
Admission: RE | Admit: 2023-09-03 | Discharge: 2023-09-03 | Disposition: A | Discharge status: Home / Self Care | Payer: Medicaid Other | Attending: Podiatry | Admitting: Podiatry

## 2023-09-03 ENCOUNTER — Other Ambulatory Visit: Payer: Self-pay

## 2023-09-03 ENCOUNTER — Ambulatory Visit

## 2023-09-03 ENCOUNTER — Other Ambulatory Visit: Payer: Self-pay | Admitting: Podiatry

## 2023-09-03 ENCOUNTER — Ambulatory Visit: Payer: Self-pay | Admitting: Urgent Care

## 2023-09-03 ENCOUNTER — Encounter
Admission: RE | Disposition: A | Discharge status: Home / Self Care | Payer: Self-pay | Source: Home / Self Care | Attending: Podiatry

## 2023-09-03 ENCOUNTER — Encounter: Payer: Self-pay | Admitting: Podiatry

## 2023-09-03 DIAGNOSIS — Z79899 Other long term (current) drug therapy: Secondary | ICD-10-CM | POA: Insufficient documentation

## 2023-09-03 DIAGNOSIS — F419 Anxiety disorder, unspecified: Secondary | ICD-10-CM | POA: Insufficient documentation

## 2023-09-03 DIAGNOSIS — I10 Essential (primary) hypertension: Secondary | ICD-10-CM | POA: Diagnosis not present

## 2023-09-03 DIAGNOSIS — M2012 Hallux valgus (acquired), left foot: Secondary | ICD-10-CM | POA: Insufficient documentation

## 2023-09-03 DIAGNOSIS — K219 Gastro-esophageal reflux disease without esophagitis: Secondary | ICD-10-CM | POA: Diagnosis not present

## 2023-09-03 DIAGNOSIS — F32A Depression, unspecified: Secondary | ICD-10-CM | POA: Diagnosis not present

## 2023-09-03 DIAGNOSIS — E039 Hypothyroidism, unspecified: Secondary | ICD-10-CM | POA: Insufficient documentation

## 2023-09-03 DIAGNOSIS — M1990 Unspecified osteoarthritis, unspecified site: Secondary | ICD-10-CM | POA: Diagnosis not present

## 2023-09-03 DIAGNOSIS — Z01812 Encounter for preprocedural laboratory examination: Secondary | ICD-10-CM

## 2023-09-03 DIAGNOSIS — M797 Fibromyalgia: Secondary | ICD-10-CM | POA: Diagnosis not present

## 2023-09-03 DIAGNOSIS — Z01810 Encounter for preprocedural cardiovascular examination: Secondary | ICD-10-CM

## 2023-09-03 DIAGNOSIS — M7752 Other enthesopathy of left foot: Secondary | ICD-10-CM | POA: Diagnosis not present

## 2023-09-03 HISTORY — PX: HALLUX VALGUS LAPIDUS: SHX6626

## 2023-09-03 HISTORY — PX: STERIOD INJECTION: SHX5046

## 2023-09-03 SURGERY — BUNIONECTOMY, LAPIDUS
Anesthesia: General | Laterality: Left

## 2023-09-03 MED ORDER — CHLORHEXIDINE GLUCONATE 0.12 % MT SOLN
OROMUCOSAL | Status: AC
  Filled 2023-09-03: qty 15

## 2023-09-03 MED ORDER — ONDANSETRON HCL 4 MG/2ML IJ SOLN
INTRAMUSCULAR | Status: DC | PRN
  Administered 2023-09-03: 4 mg via INTRAVENOUS

## 2023-09-03 MED ORDER — ORAL CARE MOUTH RINSE: 15.0000 mL | Freq: Once | OROMUCOSAL | Status: AC

## 2023-09-03 MED ORDER — ACETAMINOPHEN 10 MG/ML IV SOLN
INTRAVENOUS | Status: DC | PRN
  Administered 2023-09-03: 1000 mg via INTRAVENOUS

## 2023-09-03 MED ORDER — FENTANYL CITRATE (PF) 100 MCG/2ML IJ SOLN
INTRAMUSCULAR | Status: DC | PRN
Start: 2023-09-03 — End: 2023-09-03
  Administered 2023-09-03 (×2): 25 ug via INTRAVENOUS

## 2023-09-03 MED ORDER — TRIAMCINOLONE ACETONIDE 40 MG/ML IJ SUSP
INTRAMUSCULAR | Status: DC | PRN
  Administered 2023-09-03: 2 mL via SURGICAL_CAVITY

## 2023-09-03 MED ORDER — FENTANYL CITRATE (PF) 100 MCG/2ML IJ SOLN
INTRAMUSCULAR | Status: AC
  Filled 2023-09-03: qty 2

## 2023-09-03 MED ORDER — OXYCODONE HCL 5 MG PO TABS: 5.0000 mg | ORAL_TABLET | Freq: Once | ORAL | Status: DC | PRN

## 2023-09-03 MED ORDER — GABAPENTIN 300 MG PO CAPS: 300.0000 mg | ORAL_CAPSULE | Freq: Three times a day (TID) | ORAL | 3 refills | Status: DC

## 2023-09-03 MED ORDER — CHLORHEXIDINE GLUCONATE 0.12 % MT SOLN
15.0000 mL | Freq: Once | OROMUCOSAL | Status: AC
  Administered 2023-09-03: 15 mL via OROMUCOSAL

## 2023-09-03 MED ORDER — PHENYLEPHRINE HCL-NACL 20-0.9 MG/250ML-% IV SOLN
INTRAVENOUS | Status: DC | PRN
  Administered 2023-09-03: 50 ug/min via INTRAVENOUS

## 2023-09-03 MED ORDER — LACTATED RINGERS IV SOLN: INTRAVENOUS | Status: DC

## 2023-09-03 MED ORDER — LIDOCAINE HCL (PF) 1 % IJ SOLN
INTRAMUSCULAR | Status: DC | PRN
  Administered 2023-09-03: 3 mL

## 2023-09-03 MED ORDER — FENTANYL CITRATE (PF) 100 MCG/2ML IJ SOLN: 25.0000 ug | INTRAMUSCULAR | Status: DC | PRN

## 2023-09-03 MED ORDER — LIDOCAINE HCL (PF) 1 % IJ SOLN
INTRAMUSCULAR | Status: AC
Start: 2023-09-03 — End: ?
  Filled 2023-09-03: qty 5

## 2023-09-03 MED ORDER — MIDAZOLAM HCL 2 MG/2ML IJ SOLN
INTRAMUSCULAR | Status: AC
  Filled 2023-09-03: qty 2

## 2023-09-03 MED ORDER — CEFAZOLIN SODIUM-DEXTROSE 2-4 GM/100ML-% IV SOLN
INTRAVENOUS | Status: AC
  Filled 2023-09-03: qty 100

## 2023-09-03 MED ORDER — MIDAZOLAM HCL 2 MG/2ML IJ SOLN
1.0000 mg | INTRAMUSCULAR | Status: AC | PRN
  Administered 2023-09-03 (×2): 1 mg via INTRAVENOUS

## 2023-09-03 MED ORDER — CEFAZOLIN SODIUM-DEXTROSE 2-4 GM/100ML-% IV SOLN
2.0000 g | Freq: Once | INTRAVENOUS | Status: AC
  Administered 2023-09-03: 2 g via INTRAVENOUS

## 2023-09-03 MED ORDER — TRIAMCINOLONE ACETONIDE 40 MG/ML IJ SUSP
INTRAMUSCULAR | Status: AC
  Filled 2023-09-03: qty 1

## 2023-09-03 MED ORDER — PHENYLEPHRINE HCL-NACL 20-0.9 MG/250ML-% IV SOLN
INTRAVENOUS | Status: AC
  Filled 2023-09-03: qty 250

## 2023-09-03 MED ORDER — MIDAZOLAM HCL 2 MG/2ML IJ SOLN
INTRAMUSCULAR | Status: DC | PRN
  Administered 2023-09-03: 1 mg via INTRAVENOUS

## 2023-09-03 MED ORDER — EPHEDRINE SULFATE-NACL 50-0.9 MG/10ML-% IV SOSY
PREFILLED_SYRINGE | INTRAVENOUS | Status: DC | PRN
  Administered 2023-09-03: 10 mg via INTRAVENOUS

## 2023-09-03 MED ORDER — BUPIVACAINE LIPOSOME 1.3 % IJ SUSP
INTRAMUSCULAR | Status: AC
  Filled 2023-09-03: qty 20

## 2023-09-03 MED ORDER — BUPIVACAINE HCL (PF) 0.5 % IJ SOLN
INTRAMUSCULAR | Status: AC
  Filled 2023-09-03: qty 30

## 2023-09-03 MED ORDER — PROPOFOL 10 MG/ML IV BOLUS
INTRAVENOUS | Status: DC | PRN
  Administered 2023-09-03: 200 mg via INTRAVENOUS

## 2023-09-03 MED ORDER — OXYCODONE-ACETAMINOPHEN 5-325 MG PO TABS: 1.0000 | ORAL_TABLET | ORAL | 0 refills | Status: DC | PRN

## 2023-09-03 MED ORDER — DEXAMETHASONE SODIUM PHOSPHATE 10 MG/ML IJ SOLN
INTRAMUSCULAR | Status: DC | PRN
  Administered 2023-09-03: 5 mg via INTRAVENOUS

## 2023-09-03 MED ORDER — OXYCODONE HCL 5 MG/5ML PO SOLN: 5.0000 mg | Freq: Once | ORAL | Status: DC | PRN

## 2023-09-03 MED ORDER — LIDOCAINE HCL (CARDIAC) PF 100 MG/5ML IV SOSY
PREFILLED_SYRINGE | INTRAVENOUS | Status: DC | PRN
  Administered 2023-09-03: 60 mg via INTRAVENOUS

## 2023-09-03 MED ORDER — BUPIVACAINE LIPOSOME 1.3 % IJ SUSP
INTRAMUSCULAR | Status: DC | PRN
Start: 2023-09-03 — End: 2023-09-03
  Administered 2023-09-03: 20 mL

## 2023-09-03 SURGICAL SUPPLY — 41 items
BLADE AVERAGE 25X9 (BLADE) IMPLANT
BLADE OSC/SAG .038X5.5 CUT EDG (BLADE) IMPLANT
BLADE SAW LAPIPLASTY 40X11 (BLADE) IMPLANT
BLADE SURG 15 STRL LF DISP TIS (BLADE) ×4 IMPLANT
BNDG ELASTIC 3INX 5YD STR LF (GAUZE/BANDAGES/DRESSINGS) ×2 IMPLANT
BNDG ELASTIC 4INX 5YD STR LF (GAUZE/BANDAGES/DRESSINGS) ×2 IMPLANT
BNDG ESMARCH 4X12 STRL LF (GAUZE/BANDAGES/DRESSINGS) IMPLANT
BNDG GAUZE DERMACEA FLUFF 4 (GAUZE/BANDAGES/DRESSINGS) ×2 IMPLANT
CHLORAPREP W/TINT 26 (MISCELLANEOUS) ×2 IMPLANT
CUFF TOURN SGL QUICK 18X4 (TOURNIQUET CUFF) IMPLANT
DRAPE FLUOR MINI C-ARM 54X84 (DRAPES) IMPLANT
ELECT REM PT RETURN 9FT ADLT (ELECTROSURGICAL) ×1 IMPLANT
ELECTRODE REM PT RTRN 9FT ADLT (ELECTROSURGICAL) ×2 IMPLANT
GAUZE SPONGE 4X4 12PLY STRL (GAUZE/BANDAGES/DRESSINGS) ×2 IMPLANT
GAUZE STRETCH 2X75IN STRL (MISCELLANEOUS) ×2 IMPLANT
GAUZE XEROFORM 1X8 LF (GAUZE/BANDAGES/DRESSINGS) IMPLANT
GLOVE BIO SURGEON STRL SZ7.5 (GLOVE) ×2 IMPLANT
GLOVE SURG SYN 7.0 (GLOVE) ×1 IMPLANT
GLOVE SURG SYN 7.0 PF PI (GLOVE) ×2 IMPLANT
GOWN STRL REUS W/ TWL LRG LVL3 (GOWN DISPOSABLE) ×2 IMPLANT
INST GUIDED SPEEDRELEASE (INSTRUMENTS) ×1 IMPLANT
INST TRITOME TRIPLE EDGE REL (INSTRUMENTS) ×1 IMPLANT
INSTRUMENT GUIDED SPEEDRELEASE (INSTRUMENTS) IMPLANT
INSTRUMENT TRITOM TRPL EDG REL (INSTRUMENTS) IMPLANT
KIT TURNOVER KIT A (KITS) ×2 IMPLANT
NDL HYPO 25X1 1.5 SAFETY (NEEDLE) IMPLANT
NEEDLE HYPO 25X1 1.5 SAFETY (NEEDLE) IMPLANT
NS IRRIG 500ML POUR BTL (IV SOLUTION) IMPLANT
PACK EXTREMITY ARMC (MISCELLANEOUS) ×2 IMPLANT
PAD CAST 4YDX4 CTTN HI CHSV (CAST SUPPLIES) ×2 IMPLANT
PLATE SPEED LAPIPLASTY (Plate) IMPLANT
PLATE SPEED LAPIPLASTY QUAD (Plate) IMPLANT
STAPLER SKIN PROX 35W (STAPLE) ×2 IMPLANT
STOCKINETTE IMPERV 14X48 (MISCELLANEOUS) ×2 IMPLANT
STOCKINETTE STRL 6IN 960660 (GAUZE/BANDAGES/DRESSINGS) ×2 IMPLANT
SUT ETHILON 3 0 PS 1 (SUTURE) ×4 IMPLANT
SUT MNCRL 4-0 27 PS-2 XMFL (SUTURE) ×1 IMPLANT
SUT MNCRL AB 3-0 PS2 27 (SUTURE) ×2 IMPLANT
SUT MON AB 5-0 P3 18 (SUTURE) IMPLANT
SUTURE MNCRL 4-0 27XMF (SUTURE) IMPLANT
TOWEL OR 17X26 4PK STRL BLUE (TOWEL DISPOSABLE) ×2 IMPLANT

## 2023-09-03 NOTE — Transfer of Care (Signed)
 Immediate Anesthesia Transfer of Care Note  Patient: Denise Morales  Procedure(s) Performed: Dorena Dew (Left) STEROID INJECTION (Left)  Patient Location: PACU  Anesthesia Type:General  Level of Consciousness: awake, alert , and oriented  Airway & Oxygen Therapy: Patient Spontanous Breathing and Patient connected to face mask oxygen  Post-op Assessment: Report given to RN, Post -op Vital signs reviewed and stable, and Patient moving all extremities  Post vital signs: Reviewed and stable  Last Vitals:  Vitals Value Taken Time  BP 146/75 09/03/23 1125  Temp 36.7 C 09/03/23 1125  Pulse 79 09/03/23 1125  Resp 14 09/03/23 1125  SpO2 100 % 09/03/23 1125    Last Pain:  Vitals:   09/03/23 0906  TempSrc: Temporal  PainSc: 0-No pain         Complications: No notable events documented.

## 2023-09-03 NOTE — Interval H&P Note (Signed)
 History and Physical Interval Note:  09/03/2023 9:39 AM  Denise Morales  has presented today for surgery, with the diagnosis of hallux abducto valgus.  The various methods of treatment have been discussed with the patient and family. After consideration of risks, benefits and other options for treatment, the patient has consented to  Procedure(s) with comments: BUNIONECTOMY, LAPIDUS (Left) - POPLITEAL BLOCK STEROID INJECTION (Left) as a surgical intervention.  The patient's history has been reviewed, patient examined, no change in status, stable for surgery.  I have reviewed the patient's chart and labs.  Questions were answered to the patient's satisfaction.     Candelaria Stagers

## 2023-09-03 NOTE — Discharge Instructions (Signed)
 After Surgery Instructions   1) If you are recuperating from surgery anywhere other than home, please be sure to leave [place] the number where you can be reached.  2) Go directly home and rest.  3) Keep the operated foot(feet) elevated six inches above the hip when sitting or lying down. This will help control swelling and pain.  4) Support the elevated foot and leg with pillows. DO NOT PLACE PILLOWS UNDER THE KNEE.  5) DO NOT REMOVE or get your bandages WET, unless you were given different instructions by your doctor to do so. This increases the risk of infection.  6) Wear your surgical shoe or surgical boot at all times when you are up on your feet.  7) A limited amount of pain and swelling may occur. The skin may take on a bruised appearance. DO NOT BE ALARMED, THIS IS NORMAL.  8) For slight pain and swelling, apply an ice pack directly over the bandages for 15 minutes only out of each hour of the day. Continue until seen in the office for your first post op visit. DON NOT     APPLY ANY FORM OF HEAT TO THE AREA.  9) Have prescriptions filled immediately and take as directed.  10) Drink lots of liquids, water and juice to stay hydrated.  11) CALL IMMEDIATELY IF:  *Bleeding continues until the following day of surgery  *Pain increases and/or does not respond to medication  *Bandages or cast appears to tight  *If your bandage gets wet  *Trip, fall or stump your surgical foot  *If your temperature goes above 101  *If you have ANY questions at all  [place]. ADHERING TO THESE INSTRUCTIONS WILL OFFER YOU THE MOST COMPLETE RESULTS

## 2023-09-03 NOTE — Anesthesia Procedure Notes (Signed)
 Anesthesia Regional Block: Popliteal block   Pre-Anesthetic Checklist: , timeout performed,  Correct Patient, Correct Site, Correct Laterality,  Correct Procedure, Correct Position, site marked,  Risks and benefits discussed,  Surgical consent,  Pre-op evaluation,  At surgeon's request and post-op pain management  Laterality: Lower and Left  Prep: chloraprep       Needles:  Injection technique: Single-shot  Needle Type: Echogenic Needle     Needle Length: 9cm  Needle Gauge: 21     Additional Needles:   Procedures:,,,, ultrasound used (permanent image in chart),,    Narrative:  Start time: 09/03/2023 9:58 AM End time: 09/03/2023 10:00 AM Injection made incrementally with aspirations every 5 mL.  Performed by: Personally  Anesthesiologist: Louie Boston, MD  Additional Notes: Patient's chart reviewed and they were deemed appropriate candidate for procedure, at surgeon's request. Patient educated about risks, benefits, and alternatives of the block including but not limited to: temporary or permanent nerve damage, bleeding, infection, damage to surround tissues, block failure, local anesthetic toxicity. Patient expressed understanding. A formal time-out was conducted consistent with institution rules.  Monitors were applied, and minimal sedation used. The site was prepped with skin prep and allowed to dry, and sterile gloves were used. A high frequency linear ultrasound probe with probe cover was utilized throughout. Popliteal artery pulsatile and visualized in popliteal fossa along with adjacent sciatic nerve and its branch point, which appeared anatomically normal, local anesthetic injected around them just proximal to the branch point, and echogenic block needle trajectory was monitored throughout. Aspiration performed every 5ml. Blood vessels were avoided. All injections were performed without resistance and free of blood and paresthesias. The patient tolerated the procedure  well.  Injectate: 20cc exparel

## 2023-09-03 NOTE — Anesthesia Procedure Notes (Signed)
 Procedure Name: LMA Insertion Date/Time: 09/03/2023 10:19 AM  Performed by: Edmund Hilda, CRNAPre-anesthesia Checklist: Patient identified, Patient being monitored, Timeout performed, Emergency Drugs available and Suction available Patient Re-evaluated:Patient Re-evaluated prior to induction Oxygen Delivery Method: Circle system utilized Preoxygenation: Pre-oxygenation with 100% oxygen Induction Type: IV induction Ventilation: Mask ventilation without difficulty LMA: LMA inserted LMA Size: 4.0 Tube type: Oral Number of attempts: 1 Placement Confirmation: positive ETCO2 and breath sounds checked- equal and bilateral Tube secured with: Tape Dental Injury: Teeth and Oropharynx as per pre-operative assessment

## 2023-09-03 NOTE — Anesthesia Preprocedure Evaluation (Signed)
 Anesthesia Evaluation  Patient identified by MRN, date of birth, ID band Patient awake    Reviewed: Allergy & Precautions, NPO status , Patient's Chart, lab work & pertinent test results  History of Anesthesia Complications Negative for: history of anesthetic complications  Airway Mallampati: III  TM Distance: >3 FB Neck ROM: full    Dental no notable dental hx.    Pulmonary neg pulmonary ROS   Pulmonary exam normal        Cardiovascular hypertension, On Medications negative cardio ROS Normal cardiovascular exam     Neuro/Psych  PSYCHIATRIC DISORDERS Anxiety Depression       GI/Hepatic Neg liver ROS,GERD  Medicated and Controlled,,  Endo/Other  Hypothyroidism    Renal/GU      Musculoskeletal  (+) Arthritis ,  Fibromyalgia -  Abdominal   Peds  Hematology  (+) Blood dyscrasia, anemia   Anesthesia Other Findings Past Medical History: No date: Allergy No date: Anemia No date: Anxiety No date: Arthritis 07/2023: Bunion of left foot No date: Depression No date: Essential hypertension No date: Fibromyalgia No date: GERD (gastroesophageal reflux disease) No date: Heart murmur No date: History of hiatal hernia No date: Hyperlipidemia No date: Hypothyroidism No date: Iron deficiency anemia No date: Melanoma (HCC)     Comment:  face No date: Pre-diabetes  Past Surgical History: 1985: ABDOMINAL HYSTERECTOMY 06/05/2022: Quintella Reichert OSTEOTOMY; Right     Comment:  Procedure: Ralene Bathe;  Surgeon: Candelaria Stagers,               DPM;  Location: [facility];  Service:               Podiatry;  Laterality: Right; 12/05/2022: BICEPT TENODESIS; Right     Comment:  Procedure: RIGHT BICEPS TENODESIS;  Surgeon: Cammy Copa, MD;  Location: [facility];  Service:               Orthopedics;  Laterality: Right; 06/05/2022: CAPSULOTOMY METATARSOPHALANGEAL; Right     Comment:  Procedure:  CAPSULOTOMY METATARSOPHALANGEAL;  Surgeon:               Candelaria Stagers, DPM;  Location: [facility];  Service: Podiatry;  Laterality: Right; No date: CHOLECYSTECTOMY No date: COLONOSCOPY 12/2022: COLONOSCOPY WITH ESOPHAGOGASTRODUODENOSCOPY (EGD) 06/05/2022: HALLUX VALGUS LAPIDUS; Right     Comment:  Procedure: HALLUX VALGUS LAPIDUS;  Surgeon: Candelaria Stagers, DPM;  Location: [facility] [place];  Service:               Podiatry;  Laterality: Right; 06/05/2022: HAMMER TOE SURGERY; Right     Comment:  Procedure: HAMMER TOE CORRECTION;  Surgeon: Candelaria Stagers, DPM;  Location: [facility] [place];  Service:               Podiatry;  Laterality: Right; 12/05/2022: REVERSE SHOULDER ARTHROPLASTY; Right     Comment:  Procedure: RIGHT REVERSE SHOULDER ARTHROPLASTY;                Surgeon: Cammy Copa, MD;  Location: [facility];                Service: Orthopedics;  Laterality: Right;  04/16/2023: REVERSE SHOULDER ARTHROPLASTY; Left     Comment:  Procedure: REVERSE SHOULDER ARTHROPLASTY;  Surgeon:               Cammy Copa, MD;  Location: [facility] OR;  Service:               Orthopedics;  Laterality: Left; 08/12/2020: SHOULDER ARTHROSCOPY WITH ROTATOR CUFF REPAIR AND  SUBACROMIAL DECOMPRESSION; Right     Comment:  Procedure: RIGHT SHOULDER ARTHROSCOPY WITH ROTATOR CUFF               REPAIR AND SUBACROMIAL DECOMPRESSION;  Surgeon: Kathryne Hitch, MD;  Location: [facility];               Service: Orthopedics;  Laterality: Right; 12/24/2020: TOTAL KNEE ARTHROPLASTY; Right     Comment:  Procedure: RIGHT TOTAL KNEE ARTHROPLASTY;  Surgeon:               Kathryne Hitch, MD;  Location: [facility];  Service:              Orthopedics;  Laterality: Right; 07/15/2021: TOTAL KNEE ARTHROPLASTY; Left     Comment:  Procedure: LEFT TOTAL KNEE ARTHROPLASTY;  Surgeon:               Kathryne Hitch, MD;  Location: [facility];  Service:              Orthopedics;  Laterality: Left; 03/12/2023: XI ROBOTIC ASSISTED PARAESOPHAGEAL HERNIA REPAIR; N/A     Comment:  Procedure: XI ROBOTIC ASSISTED PARAESOPHAGEAL HERNIA               REPAIR WITH FUNDOPLICATION;  Surgeon: Berna Bue,               MD;  Location: [facility];  Service: General;  Laterality:               N/A;  180     Reproductive/Obstetrics negative OB ROS                             Anesthesia Physical Anesthesia Plan  ASA: 3  Anesthesia Plan: General LMA   Post-op Pain Management: Regional block*, Toradol IV (intra-op)* and Ofirmev IV (intra-op)*   Induction: Intravenous  PONV Risk Score and Plan: 3 and Dexamethasone, Ondansetron, Midazolam and Treatment may vary due to age or medical condition  Airway Management Planned: LMA  Additional Equipment:   Intra-op Plan:   Post-operative Plan: Extubation in OR  Informed Consent: I have reviewed the patients History and Physical, chart, labs and discussed the procedure including the risks, benefits and alternatives for the proposed anesthesia with the patient or authorized representative who has indicated his/her understanding and acceptance.     Dental Advisory Given  Plan Discussed with: Anesthesiologist, CRNA and Surgeon  Anesthesia Plan Comments: (Patient consented for risks of anesthesia including but not limited to:  - adverse reactions to medications - damage to eyes, teeth, lips or other oral mucosa - nerve damage due to positioning  - sore throat or hoarseness - Damage to heart, brain, nerves, lungs, other parts of body or loss of life  Patient voiced understanding and assent.)       Anesthesia Quick Evaluation

## 2023-09-04 ENCOUNTER — Encounter: Payer: Self-pay | Admitting: Podiatry

## 2023-09-04 NOTE — Anesthesia Postprocedure Evaluation (Signed)
 Anesthesia Post Note  Patient: Anayah Arvanitis  Procedure(s) Performed: Dorena Dew (Left) STEROID INJECTION (Left)  Patient location during evaluation: PACU Anesthesia Type: General and Regional Level of consciousness: awake and alert Pain management: pain level controlled Vital Signs Assessment: post-procedure vital signs reviewed and stable Respiratory status: spontaneous breathing, nonlabored ventilation, respiratory function stable and patient connected to nasal cannula oxygen Cardiovascular status: blood pressure returned to baseline and stable Postop Assessment: no apparent nausea or vomiting Anesthetic complications: no   No notable events documented.   Last Vitals:  Vitals:   09/03/23 1209 09/03/23 1224  BP: 123/72 127/77  Pulse: 72 72  Resp: 13 16  Temp:  36.4 C  SpO2: 98% 97%    Last Pain:  Vitals:   09/04/23 0830  TempSrc:   PainSc: 0-No pain                 Louie Boston

## 2023-09-04 NOTE — Op Note (Addendum)
 Surgeon: Surgeon(s): Candelaria Stagers, DPM  Assistants: None Pre-operative diagnosis: hallux abducto valgus  Post-operative diagnosis: same Procedure: Procedure(s) (LRB): BUNIONECTOMY, LAPIDUS (Left) STEROID INJECTION (Left)  Pathology: * No specimens in log *  Pertinent Intra-op findings: Left severe bunion deformity* Anesthesia: Choice  Hemostasis:  Total Tourniquet Time Documented: Thigh (Left) - -[id number] minutes Total: Thigh (Left) - -[id number] minutes  EBL: 2 mL  Materials: 3-0 Monocryl, 4-0 Monocryl, 5-0 Monocryl and Treace medical Lapiplasty Injectables: None Complications: None  Indications for surgery: A 63 y.o. female presents with left painful bunion deformity severe. Patient has failed all conservative therapy including but not limited to shoe gear modification padding protecting offloading. She wishes to have surgical correction of the foot/deformity. It was determined that patient would benefit from left Lapidus bunionectomy. Informed surgical risk consent was reviewed and read aloud to the patient.  I reviewed the films.  I have discussed my findings with the patient in great detail.  I have discussed all risks including but not limited to infection, stiffness, scarring, limp, disability, deformity, damage to blood vessels and nerves, numbness, poor healing, need for braces, arthritis, chronic pain, amputation, death.  All benefits and realistic expectations discussed in great detail.  I have made no promises as to the outcome.  I have provided realistic expectations.  I have offered the patient a 2nd opinion, which they have declined and assured me they preferred to proceed despite the risks   Procedure in detail: The patient was both verbally and visually identified by myself, the nursing staff, and anesthesia staff in the preoperative holding area. They were then transferred to the operating room and placed on the operative table in supine position.   Surgeon:  Surgeon(s): Candelaria Stagers, DPM  Assistants: None Pre-operative diagnosis: BUNION  Post-operative diagnosis: same Procedure: Procedure(s) (LRB): HALLUX VALGUS LAPIDUS (Right) AIKEN OSTEOTOMY (Right)  Pathology: None Pertinent Intra-op findings: Severe bunion deformity noted Anesthesia: Choice  Hemostasis:  Total Tourniquet Time Documented: Calf (Right) - 51 minutes Total: Calf (Right) - 51 minutes   EBL: Minimal Materials: 3-0 Monocryl 4-0 Monocryl 5-0 Monocryl.  Treace medical Lapiplasty system Injectables: None Complications: None   Indications for surgery: A 63 y.o. female presents with right severe bunion deformity painful to touch. Patient has failed all conservative therapy including but not limited to shoe gear modification padding protecting offloading. She wishes to have surgical correction of the foot/deformity. It was determined that patient would benefit from right Lapidus bunionectomy. Informed surgical risk consent was reviewed and read aloud to the patient.  I reviewed the films.  I have discussed my findings with the patient in great detail.  I have discussed all risks including but not limited to infection, stiffness, scarring, limp, disability, deformity, damage to blood vessels and nerves, numbness, poor healing, need for braces, arthritis, chronic pain, amputation, death.  All benefits and realistic expectations discussed in great detail.  I have made no promises as to the outcome.  I have provided realistic expectations.  I have offered the patient a 2nd opinion, which they have declined and assured me they preferred to proceed despite the risks     Procedure in detail: The patient was both verbally and visually identified by myself, the nursing staff, and anesthesia staff in the preoperative holding area. They were then transferred to the operating room and placed on the operative table in supine position.   Patient is brought to the operating room and placedron  table in supine position well-padded ankle tourniquet  is applied to the  calf. Anesthesia is achievedpopliteal and saphenous nerve block with MAC. The extremity was then scrubbed prepped and draped in normal sterile customary fashion. Upon exsanguination of the foot the calf tourniquet was inflated to 250 mm of mercury. Attention is then directed at the 1st metatarsocuneiform joint where an incision is initiated at the proximal aspect of the medial cuneiform down to the level of the mid shaft of the 1st metatarsal. A #15 Blade was then used to deepen the incision through the subcutaneous tissues down the level of the periosteum. The periosteum is then reflected at the 1st metatarsal and metatarsocuneiform joint and medial cuneiform. All superficial bleeders were identified and bovied care was taken to retract the neurovascular structures medial. At this point the 40 mm saw blade is used to reciprocally plane the metatarsocuneiform joint a osteotome was then used to free the plantar ligaments, this is to allow mobilization of the 1st metatarsal cuneiform joint in order to free and correct the frontal plane motion with this bunion deformity. At this point a Glorious Peach is used to create a pocket at the base laterally of the 1st metatarsal. At this point a short K-wire is then placed at the base of the 1st metatarsal positioned towards the 5th metatarsal head to the lateral cortex of the 1st metatarsal this joy stick is then used to correct the frontal plane motion. At this point inspection under fluoroscopy shows that the sesamoids cannot fully rotate under the 1st metatarsal head. PROCEDURE: LATERAL CAPSULE AND SESAMOID RELEASE: At this point a linear incision is placed in the right 1st interspace 15. Blade was used to deepen incision down through the subcutaneous tissue. Metzenbaum scissors were used to bluntly dissect to the lateral capsule of the right foot. A 15. Blade is then used to create a  lateral capsulotomy and utilizing a J stroke both proximally and laterally the suspensory sesamoidal ligament is released manipulation of the 1st metatarsophalangeal joint shows good motion and adequate release of the lateral capsule. At this point the is joystick is then used to put the 1st metatarsal through frontal plane motion and the sesamoids are observed to be placed back in its normal anatomical position. At this point the 3.5 mm fulcrum is placed at the 1st metatarsal lateral base. A stab incision is then placed at the dorsal aspect of the second metatarsal to allow placement of the the C-clamp. This is then placed at the lateral 2nd metatarsal shaft and just distal to the medial ridge of the 1st metatarsal. The C-clamp is then tightened to correct the intermetatarsal angle. At the same time the joystick is used to rotate the 1st metatarsal and the right hallux was dorsiflexed to maintain the metatarsal in the sagittal plane. Verification on the C-arm shows good correction of the bunion deformity in all 3 planes. A smooth K-wire is then driven through the clamp to hold the corrected anatomic position. The speed seeker is then placed in the joint and lateral aspect of the first metatarsal. At this point the gold cut guide is placed over the speed seeker verification on the C-arm shows good placement of the cut guide in line with the bisection of the 1st metatarsal. This was then secured in place with 3 short pins at this point the saw blade was then used to make the cuneiform cut followed by the 1st metatarsal base cut. The cuts are made through and through the plantar cortex. At this point an osteotome was used  to free the plantar ligaments. The bone fragments are then removed in toto. At this point the joint distractor is placed over the short pins further inspection is made to ensure no plantar bone is remaining. The wound is then flushed copious amounts sterile saline. Utilizing a 2.0  drill fenestration of the osteotomy site was achieved on the 1st metatarsal and medial cuneiform. The at this point the fulcrum is placed on the lateral aspect. In the osteotomy is closed. Verification on the C-arm shows no elevation of the 1st metatarsal and good approximation of the osteotomy. At this point a 2.0 olive wire is used to provisionally fixate the osteotomy. This was placed from the lateral aspect of the 1st metatarsal across the medial cuneiform medially. A smooth K-wires and driven from medial to lateral. At this point the distractor is removed and the short pins are removed also. Attention then directed at the dorsal lateral wound where a joint where dorsal plate is placed at the lateral most aspect of the metatarsocuneiform joint verification on the C-arm showed good placement and no violation of the inter cuneiform joint laterally. the splint place was inserted in standard technique application noted this was followed by insertion of medial speed plate in standard technique. Verification on the C-arm showed good placement of the plates which is at a 90 degree construct. Delete and flushsterile saline the periosteal and subcutaneous tissues were closed with 3-0Monocryl, 4-0 Monocryl, 5-0 Monocryl. The 1st interspace incision is also closed with 3-0 Prolene as is the 2nd metatarsal incision. Final radiographs show good anatomic reduction of the previous bunion deformity with good placement of the plate. the wound is dressed with Adaptic 4x4s and Kerlix. An Ace wrap was then applied around the foot. The calf tourniquet is then deflated and color returned to the digits of the foot x5. At this point a posterior splint is applied. The patient left the operating room with vital signs stable and neurovascular status intact. Attention was directed to the foot which was elevated and exsanguinated with an esmarch tourniquet. The calf pneumatic tourniquet was then inflated    Attention  was directed to the left dorsal midfoot, Kenalog 41 cc and 1 cc half percent Marcaine plain was injected to the dorsal midfoot for inflammation/capsulitis.  No complication noted.  No bleeding noted.  At the conclusion of the procedure the patient was awoken from anesthesia and found to have tolerated the procedure well any complications. There were transferred to PACU with vital signs stable and vascular status intact.  Nicholes Rough, DPM

## 2023-09-06 ENCOUNTER — Other Ambulatory Visit: Payer: Self-pay | Admitting: Family Medicine

## 2023-09-06 MED ORDER — BUSPIRONE HCL 7.5 MG PO TABS: 7.5000 mg | ORAL_TABLET | Freq: Two times a day (BID) | ORAL | 0 refills | Status: DC

## 2023-09-08 ENCOUNTER — Telehealth: Payer: Self-pay | Admitting: Podiatry

## 2023-09-08 DIAGNOSIS — Z9889 Other specified postprocedural states: Secondary | ICD-10-CM

## 2023-09-08 NOTE — Telephone Encounter (Signed)
 Patient contacted our voice answering service with concerns to operative extremity.  Date of surgery 3/10 and had Lapidus procedure done.  She states that foot is totally numb at this point and she cannot move the extremity at this point. Advised patient that this could be secondary to tourniquet use or from nerve block, however would not represent any signs of infection or ischemia.  Advised patient to keep the cam boot applied in accordance to Dr. Eliane Decree postoperative instructions, keep extremity elevated, continue to ice and monitor for signs of infection or blood clot.

## 2023-09-09 ENCOUNTER — Other Ambulatory Visit: Payer: Self-pay | Admitting: Family Medicine

## 2023-09-09 DIAGNOSIS — E782 Mixed hyperlipidemia: Secondary | ICD-10-CM

## 2023-09-12 ENCOUNTER — Encounter: Payer: Self-pay | Admitting: Podiatry

## 2023-09-12 ENCOUNTER — Ambulatory Visit (INDEPENDENT_AMBULATORY_CARE_PROVIDER_SITE_OTHER): Payer: Medicaid Other | Admitting: Podiatry

## 2023-09-12 ENCOUNTER — Ambulatory Visit (INDEPENDENT_AMBULATORY_CARE_PROVIDER_SITE_OTHER)

## 2023-09-12 DIAGNOSIS — M2012 Hallux valgus (acquired), left foot: Secondary | ICD-10-CM

## 2023-09-12 DIAGNOSIS — Z9889 Other specified postprocedural states: Secondary | ICD-10-CM

## 2023-09-12 NOTE — Progress Notes (Signed)
 Subjective:  Patient ID: Denise Morales, female    DOB: 1961/05/08,  MRN: [id number]  Chief Complaint  Patient presents with   Routine Post Op    Rm11: POV#1 dos 09/03/23 Pt denies pain or discomfort. No signs of infection at the surgical site.    DOS: 09/03/2023 Procedure: Left Lapidus bunionectomy  63 y.o. female returns for post-op check.  Patient states that she is doing well.  Pain is controlled.  Minimal pain bandages clean dry and intact ambulating with a cam boot weight to the heel  Review of Systems: Negative except as noted in the HPI. Denies N/V/F/Ch.  Past Medical History:  Diagnosis Date   Allergy    Anemia    Anxiety    Arthritis    Bunion of left foot 07/2023   Depression    Essential hypertension    Fibromyalgia    GERD (gastroesophageal reflux disease)    Heart murmur    History of hiatal hernia    Hyperlipidemia    Hypothyroidism    Iron deficiency anemia    Melanoma (HCC)    face   Pre-diabetes     Current Outpatient Medications:    acetaminophen (TYLENOL) 500 MG tablet, Take 500-1,000 mg by mouth See admin instructions. Take 2 tablets (1000 mg) by mouth scheduled at bedtime & every 6 hours if needed for pain., Disp: , Rfl:    busPIRone (BUSPAR) 7.5 MG tablet, Take 1 tablet (7.5 mg total) by mouth 2 (two) times daily., Disp: 60 tablet, Rfl: 0   celecoxib (CELEBREX) 100 MG capsule, Take 1 capsule (100 mg total) by mouth 2 (two) times daily., Disp: 180 capsule, Rfl: 1   dicyclomine (BENTYL) 10 MG capsule, Take 1 capsule (10 mg total) by mouth 4 (four) times daily as needed for spasms. (Patient taking differently: Take 10 mg by mouth in the morning and at bedtime.), Disp: 270 capsule, Rfl: 0   escitalopram (LEXAPRO) 20 MG tablet, Take 1 tablet by mouth once daily (Patient taking differently: Take 20 mg by mouth at bedtime.), Disp: 90 tablet, Rfl: 0   gabapentin (NEURONTIN) 300 MG capsule, Take 1 capsule (300 mg total) by mouth 3 (three) times daily., Disp:  90 capsule, Rfl: 3   KRILL OIL PO, Take 500 mg by mouth in the morning., Disp: , Rfl:    levothyroxine (SYNTHROID) 50 MCG tablet, Take 1 tablet (50 mcg total) by mouth daily., Disp: 90 tablet, Rfl: 3   lisinopril-hydrochlorothiazide (ZESTORETIC) 20-12.5 MG tablet, Take 1 tablet by mouth daily., Disp: 90 tablet, Rfl: 3   oxyCODONE-acetaminophen (PERCOCET) 5-325 MG tablet, Take 1 tablet by mouth every 4 (four) hours as needed for severe pain (pain score 7-10)., Disp: 30 tablet, Rfl: 0   pravastatin (PRAVACHOL) 40 MG tablet, Take 1 tablet by mouth once daily, Disp: 90 tablet, Rfl: 0   pregabalin (LYRICA) 75 MG capsule, Take 1 capsule by mouth twice daily, Disp: 120 capsule, Rfl: 0   QUEtiapine (SEROQUEL) 50 MG tablet, TAKE 1 TABLET BY MOUTH AT BEDTIME ON MONDAYS, WEDNESDAYS , FRIDAYS AND  SATURDAYS, Disp: 12 tablet, Rfl: 0   TURMERIC PO, Take 1,500 mg by mouth in the morning., Disp: , Rfl:    Vitamin D-Vitamin K (VITAMIN K2-VITAMIN D3 PO), Take 1 tablet by mouth in the morning. 100 mg/50 mcg, Disp: , Rfl:    zolpidem (AMBIEN) 5 MG tablet, TAKE 1 TABLET BY MOUTH EVERY OTHER NIGHT ALTERNATING WITH QUETIAPINE, Disp: 15 tablet, Rfl: 0  Social History  Tobacco Use  Smoking Status Never   Passive exposure: Past  Smokeless Tobacco Never    Allergies  Allergen Reactions   Hydroxyzine Other (See Comments)    hallucinations   Objective:  There were no vitals filed for this visit. There is no height or weight on file to calculate BMI. Constitutional Well developed. Well nourished.  Vascular Foot warm and well perfused. Capillary refill normal to all digits.   Neurologic Normal speech. Oriented to person, place, and time. Epicritic sensation to light touch grossly present bilaterally.  Dermatologic Skin healing well without signs of infection. Skin edges well coapted without signs of infection.  Orthopedic: Tenderness to palpation noted about the surgical site.   Radiographs: 3 views of  skeletally mature adult left: Reduction of bunion deformity noted reduction of sesamoid position noted.  Good correction alignment noted hardware is intact Assessment:   1. Hav (hallux abducto valgus), left   2. Status post foot surgery    Plan:  Patient was evaluated and treated and all questions answered.  S/p foot surgery left -Progressing as expected post-operatively. -XR: See above -WB Status: Nonweightbearing in left lower extremity in knee scooter -Sutures: Intact.  No clinical signs of dehiscence noted no complication noted. -Medications: None -Foot redressed.  No follow-ups on file.

## 2023-09-16 ENCOUNTER — Other Ambulatory Visit: Payer: Self-pay | Admitting: Family Medicine

## 2023-09-16 DIAGNOSIS — M797 Fibromyalgia: Secondary | ICD-10-CM

## 2023-09-17 ENCOUNTER — Encounter: Payer: Self-pay | Admitting: Family Medicine

## 2023-09-17 ENCOUNTER — Ambulatory Visit (INDEPENDENT_AMBULATORY_CARE_PROVIDER_SITE_OTHER): Payer: Medicaid Other | Admitting: Clinical

## 2023-09-17 DIAGNOSIS — F331 Major depressive disorder, recurrent, moderate: Secondary | ICD-10-CM | POA: Diagnosis not present

## 2023-09-17 NOTE — Progress Notes (Signed)
 THERAPIST PROGRESS NOTE Virtual Visit via Video Note  I connected with Glean Salen on 09/17/23 at 10:00 AM EDT by a video enabled telemedicine application and verified that I am speaking with the correct person using two identifiers.  Location: Patient: Home Provider: Office   I discussed the limitations of evaluation and management by telemedicine and the availability of in person appointments. The patient expressed understanding and agreed to proceed.   Follow Up Instructions: I discussed the assessment and treatment plan with the patient. The patient was provided an opportunity to ask questions and all were answered. The patient agreed with the plan and demonstrated an understanding of the instructions.   The patient was advised to call back or seek an in-person evaluation if the symptoms worsen or if the condition fails to improve as anticipated.   Session Time: 16 minutes  Participation Level: Active  Behavioral Response: CasualAlertEuthymic  Type of Therapy: Individual Therapy  Treatment Goals addressed: Increase coping skills to manage depression and improve ability to perform daily activities by self report 1x per session   ProgressTowards Goals: Progressing  Interventions: CBT  Summary:  Alicja Everitt is a 63 y.o. female who presents for scheduled appointment oriented x 5, appropriately dressed, and friendly. Client denied hallucinations and delusions. Client currently she is doing pretty well.  Client reported she recently had surgery on her right foot.  Client reported they should be her last surgery.  Client reported she did have an issue with the medication that they prescribed which blocked her nerves for a week in that foot.  Client reported her feeling has come back.  Client reported there is immediate 6-week healing day and she will go back to see her doctor in April sometime to have the boot removed.  Client reported the only change that has occurred is  she and her family found out through Facebook that her ex-husband who is the father of her daughter has passed.  Client reported she had some uneasy feelings about seeing it as a loss to her as well.  Client reported the sad part about the situation is that no one from the family contacted her daughter to let her know.  Client reported when her daughter was informed she did cry but she stated that she did not see it as a significant loss because her father was not a part of her life.  Client reported otherwise things are going well at home and she has no complaints at this time. Evidence of progress towards goal: Client reported 1 positive of effectively working through symptoms of depression at this time.   Suicidal/Homicidal: Nowithout intent/plan  Therapist Response:  Therapist began the appointment asking the client how she has been doing since last seen. Therapist used CBT to engage with active listening and positive emotional support. Therapist used CBT to ask client about current severity of any depressive and/or anxiety symptoms that are present. Therapist used CBT to normalize the client symptoms of grief and positively reinforced her approach to supporting her family in the process. Therapist used CBT ask the client to identify her progress with frequency of use with coping skills with continued practice in her daily activity.    Therapist assigned client homework to practice self-care.   Plan: Return again in 4 weeks.  Diagnosis: Major depressive disorder, recurrent episode, moderate  Collaboration of Care: Patient refused AEB none requested by the client.  Patient/Guardian was advised Release of Information must be obtained prior to any record release  in order to collaborate their care with an outside provider. Patient/Guardian was advised if they have not already done so to contact the registration department to sign all necessary forms in order for [place] to release information  regarding their care.   Consent: Patient/Guardian gives verbal consent for treatment and assignment of benefits for services provided during this visit. Patient/Guardian expressed understanding and agreed to proceed.   Neena Rhymes Rahma Meller, LCSW 09/17/2023

## 2023-09-18 ENCOUNTER — Encounter (HOSPITAL_COMMUNITY): Payer: Self-pay | Admitting: Psychiatry

## 2023-09-18 ENCOUNTER — Ambulatory Visit (INDEPENDENT_AMBULATORY_CARE_PROVIDER_SITE_OTHER): Payer: Medicaid Other | Admitting: Psychiatry

## 2023-09-18 DIAGNOSIS — F32A Depression, unspecified: Secondary | ICD-10-CM | POA: Diagnosis not present

## 2023-09-18 DIAGNOSIS — F411 Generalized anxiety disorder: Secondary | ICD-10-CM | POA: Diagnosis not present

## 2023-09-18 NOTE — Progress Notes (Signed)
 Psychiatric Initial Adult Assessment   Patient Identification: Denise Morales MRN:  [id number] Date of Evaluation:  09/18/2023 Referral Source: Idalia Needle [place] Chief Complaint:  " My primary care doctor wants you to review my medications" Visit Diagnosis:    ICD-10-CM   1. Generalized anxiety disorder  F41.1     2. Mild depression  F32.A       History of Present Illness: 63 year old female seen today for initial psychiatric evaluation.  She was referred to outpatient psychiatry by her PCP as well as her therapist.  She has a psychiatric history of depression and anxiety.  Currently she is managed on Seroquel 50 mg on Monday, Wednesdays, Fridays, and Saturdays.  She also takes Ambien 5 mg which she alternates with Seroquel.  Patient is prescribed Lexapro 20 mg at bedtime and BuSpar 7.5 mg twice daily.  She informed Clinical research associate that her medications are effective for managing her psychiatric conditions.  Today she was well-groomed, pleasant, cooperative, and engaged in conversation.  She reports that her PCP wants provider to review her medications.  Patient notes that recently she has been forgetful.  She informed Clinical research associate that she will be having conversations and have lapses in memory.  Provider informed patient that Celebrex and Lyrica can cause confusion and issues with judgment.  Provider also informed patient that taking Ambien for long period of time can affect memory.  Provider recommended that patient discontinue Ambien and utilize Seroquel nightly for mood and sleep.  She notes that she discussed this with her primary care doctor.  Patient reports that her anxiety and depression are well-managed.  Provider conducted a GAD-7 and patient scored a 9.  She notes that her primary concern is her daughter.  Provider also conducted PHQ-9 patient with 2.  Today she endorses adequate sleep and reduced appetite.  She denies SI/HI/VAH, mania, paranoia.  Patient informed Clinical research associate that her mother had a history of  dementia but notes that she is fearful that she will develop it as well.  Provider recommended adjusting medications as they can be affecting her memory.  Provider informed patient that this is not successful to have her PCP refer her to neurology.  She endorsed understanding and agreed.  Patient informed Clinical research associate that she grieves the loss of her mother, sisters, and father.  She does note that she is coping with it with medications and therapy.  Patient denies flashbacks, nightmares, or avoidant behaviors.  Patient denies alcohol, tobacco, or illegal drug use.  At this time no medication changes made.  Patient will continue to follow-up with her primary care to have her prescriptions filled.  She will discuss the above recommendations with her PCP.  No other concerns at this time.  Associated Signs/Symptoms: Depression Symptoms:  difficulty concentrating, impaired memory, decreased labido, (Hypo) Manic Symptoms:  Distractibility, Irritable Mood, Anxiety Symptoms:  Excessive Worry, Psychotic Symptoms:   Denies PTSD Symptoms: Had a traumatic exposure:  Death of mother in Oct 03, 2018, sister in 10/03/14 and in 2020/10/02  Past Psychiatric History: Anxiety and Depression  Previous Psychotropic Medications:  Buspar, Seroquel, gabapentin, Ambien, lexapro, hydroxyzine (hallucinations, melatonin, trazodone)  Substance Abuse History in the last 12 months:  No.  Consequences of Substance Abuse: NA  Past Medical History:  Past Medical History:  Diagnosis Date   Allergy    Anemia    Anxiety    Arthritis    Bunion of left foot 07/2023   Depression    Essential hypertension    Fibromyalgia    GERD (gastroesophageal reflux  disease)    Heart murmur    History of hiatal hernia    Hyperlipidemia    Hypothyroidism    Iron deficiency anemia    Melanoma (HCC)    face   Pre-diabetes     Past Surgical History:  Procedure Laterality Date   ABDOMINAL HYSTERECTOMY  1985   AIKEN OSTEOTOMY Right 06/05/2022    Procedure: Quintella Reichert OSTEOTOMY;  Surgeon: Candelaria Stagers, DPM;  Location: [facility];  Service: Podiatry;  Laterality: Right;   BICEPT TENODESIS Right 12/05/2022   Procedure: RIGHT BICEPS TENODESIS;  Surgeon: Cammy Copa, MD;  Location: [facility] OR;  Service: Orthopedics;  Laterality: Right;   CAPSULOTOMY METATARSOPHALANGEAL Right 06/05/2022   Procedure: CAPSULOTOMY METATARSOPHALANGEAL;  Surgeon: Candelaria Stagers, DPM;  Location: [facility];  Service: Podiatry;  Laterality: Right;   CHOLECYSTECTOMY     COLONOSCOPY     COLONOSCOPY WITH ESOPHAGOGASTRODUODENOSCOPY (EGD)  12/2022   HALLUX VALGUS LAPIDUS Right 06/05/2022   Procedure: HALLUX VALGUS LAPIDUS;  Surgeon: Candelaria Stagers, DPM;  Location:  SURGERY CENTER;  Service: Podiatry;  Laterality: Right;   HALLUX VALGUS LAPIDUS Left 09/03/2023   Procedure: Dorena Dew;  Surgeon: Candelaria Stagers, DPM;  Location: [facility];  Service: Orthopedics/Podiatry;  Laterality: Left;  POPLITEAL BLOCK   HAMMER TOE SURGERY Right 06/05/2022   Procedure: HAMMER TOE CORRECTION;  Surgeon: Candelaria Stagers, DPM;  Location: [facility] ;  Service: Podiatry;  Laterality: Right;   REVERSE SHOULDER ARTHROPLASTY Right 12/05/2022   Procedure: RIGHT REVERSE SHOULDER ARTHROPLASTY;  Surgeon: Cammy Copa, MD;  Location: [facility] OR;  Service: Orthopedics;  Laterality: Right;   REVERSE SHOULDER ARTHROPLASTY Left 04/16/2023   Procedure: REVERSE SHOULDER ARTHROPLASTY;  Surgeon: Cammy Copa, MD;  Location: [facility] OR;  Service: Orthopedics;  Laterality: Left;   SHOULDER ARTHROSCOPY WITH ROTATOR CUFF REPAIR AND SUBACROMIAL DECOMPRESSION Right 08/12/2020   Procedure: RIGHT SHOULDER ARTHROSCOPY WITH ROTATOR CUFF REPAIR AND SUBACROMIAL DECOMPRESSION;  Surgeon: Kathryne Hitch, MD;  Location: [facility];  Service: Orthopedics;  Laterality: Right;   STERIOD INJECTION Left 09/03/2023   Procedure: STEROID  INJECTION;  Surgeon: Candelaria Stagers, DPM;  Location: [facility];  Service: Orthopedics/Podiatry;  Laterality: Left;   TOTAL KNEE ARTHROPLASTY Right 12/24/2020   Procedure: RIGHT TOTAL KNEE ARTHROPLASTY;  Surgeon: Kathryne Hitch, MD;  Location: [facility];  Service: Orthopedics;  Laterality: Right;   TOTAL KNEE ARTHROPLASTY Left 07/15/2021   Procedure: LEFT TOTAL KNEE ARTHROPLASTY;  Surgeon: Kathryne Hitch, MD;  Location: [facility];  Service: Orthopedics;  Laterality: Left;   XI ROBOTIC ASSISTED PARAESOPHAGEAL HERNIA REPAIR N/A 03/12/2023   Procedure: XI ROBOTIC ASSISTED PARAESOPHAGEAL HERNIA REPAIR WITH FUNDOPLICATION;  Surgeon: Berna Bue, MD;  Location: [facility];  Service: General;  Laterality: N/A;  180    Family Psychiatric History: Mother and maternal family depression  Family History:  Family History  Problem Relation Age of Onset   Dementia Mother    Heart Problems Mother    Stroke Mother    Hypertension Mother    Hypertension Father    Colon polyps Father    Diabetes Father    Hypertension Sister    Diabetes Sister        borderline   Fibromyalgia Sister    Hypertension Sister    Colon polyps Sister    Diabetes Sister    Fibromyalgia Sister    Heart Problems Sister    Arthritis Sister  Hypertension Sister    Heart Problems Sister    Healthy Daughter    Diabetes Paternal Aunt    Colon cancer Neg Hx    Esophageal cancer Neg Hx    Rectal cancer Neg Hx    Stomach cancer Neg Hx    Breast cancer Neg Hx     Social History:   Social History   Socioeconomic History   Marital status: Married    Spouse name: Harvie Heck   Number of children: 1   Years of education: Not on file   Highest education level: 12th grade  Occupational History   Not on file  Tobacco Use   Smoking status: Never    Passive exposure: Past   Smokeless tobacco: Never  Vaping Use   Vaping status: Never Used  Substance and Sexual Activity   Alcohol use: Not Currently   Drug use:  Never   Sexual activity: Not Currently    Birth control/protection: Surgical    Comment: Hysterectomy  Other Topics Concern   Not on file  Social History Narrative   Not on file   Social Drivers of Health   Financial Resource Strain: Low Risk  (07/06/2023)   Overall Financial Resource Strain (CARDIA)    Difficulty of Paying Living Expenses: Not hard at all  Food Insecurity: No Food Insecurity (07/06/2023)   Hunger Vital Sign    Worried About Running Out of Food in the Last Year: Never true    Ran Out of Food in the Last Year: Never true  Transportation Needs: No Transportation Needs (07/06/2023)   PRAPARE - Administrator, Civil Service (Medical): No    Lack of Transportation (Non-Medical): No  Physical Activity: Insufficiently Active (07/06/2023)   Exercise Vital Sign    Days of Exercise per Week: 2 days    Minutes of Exercise per Session: 30 min  Stress: No Stress Concern Present (07/06/2023)   [employer] of Occupational Health - Occupational Stress Questionnaire    Feeling of Stress : Not at all  Social Connections: Unknown (07/06/2023)   Social Connection and Isolation Panel [NHANES]    Frequency of Communication with Friends and Family: More than three times a week    Frequency of Social Gatherings with Friends and Family: Once a week    Attends Religious Services: Never    Database administrator or Organizations: No    Attends Engineer, structural: Not on file    Marital Status: Patient declined    Additional Social History: Patient resides in [place] with her husband. She has one older daughter. She has one older daughter. She denies alcohol, tobacco, and illegal drug use. She is currently unemployed.   Allergies:   Allergies  Allergen Reactions   Hydroxyzine Other (See Comments)    hallucinations    Metabolic Disorder Labs: Lab Results  Component Value Date   HGBA1C 6.2 (H) 10/23/2022   MPG 131 10/23/2022   No results found for:  "PROLACTIN" Lab Results  Component Value Date   CHOL 166 03/30/2023   TRIG 250 (H) 03/30/2023   HDL 49 (L) 03/30/2023   CHOLHDL 3.4 03/30/2023   LDLCALC 84 03/30/2023   LDLCALC 74 10/23/2022   Lab Results  Component Value Date   TSH 3.05 03/30/2023    Therapeutic Level Labs: No results found for: "LITHIUM" No results found for: "CBMZ" No results found for: "VALPROATE"  Current Medications: Current Outpatient Medications  Medication Sig Dispense Refill   acetaminophen (TYLENOL) 500  MG tablet Take 500-1,000 mg by mouth See admin instructions. Take 2 tablets (1000 mg) by mouth scheduled at bedtime & every 6 hours if needed for pain.     busPIRone (BUSPAR) 7.5 MG tablet Take 1 tablet (7.5 mg total) by mouth 2 (two) times daily. 60 tablet 0   celecoxib (CELEBREX) 100 MG capsule Take 1 capsule (100 mg total) by mouth 2 (two) times daily. 180 capsule 1   dicyclomine (BENTYL) 10 MG capsule Take 1 capsule (10 mg total) by mouth 4 (four) times daily as needed for spasms. (Patient taking differently: Take 10 mg by mouth in the morning and at bedtime.) 270 capsule 0   escitalopram (LEXAPRO) 20 MG tablet Take 1 tablet by mouth once daily (Patient taking differently: Take 20 mg by mouth at bedtime.) 90 tablet 0   KRILL OIL PO Take 500 mg by mouth in the morning.     levothyroxine (SYNTHROID) 50 MCG tablet Take 1 tablet (50 mcg total) by mouth daily. 90 tablet 3   lisinopril-hydrochlorothiazide (ZESTORETIC) 20-12.5 MG tablet Take 1 tablet by mouth daily. 90 tablet 3   oxyCODONE-acetaminophen (PERCOCET) 5-325 MG tablet Take 1 tablet by mouth every 4 (four) hours as needed for severe pain (pain score 7-10). 30 tablet 0   pravastatin (PRAVACHOL) 40 MG tablet Take 1 tablet by mouth once daily 90 tablet 0   pregabalin (LYRICA) 75 MG capsule Take 1 capsule by mouth twice daily 120 capsule 0   QUEtiapine (SEROQUEL) 50 MG tablet TAKE 1 TABLET BY MOUTH AT BEDTIME ON MONDAYS, WEDNESDAYS , FRIDAYS AND   SATURDAYS 12 tablet 0   TURMERIC PO Take 1,500 mg by mouth in the morning.     Vitamin D-Vitamin K (VITAMIN K2-VITAMIN D3 PO) Take 1 tablet by mouth in the morning. 100 mg/50 mcg     zolpidem (AMBIEN) 5 MG tablet TAKE 1 TABLET BY MOUTH EVERY OTHER NIGHT ALTERNATING WITH QUETIAPINE 15 tablet 0   No current facility-administered medications for this visit.    Musculoskeletal: Strength & Muscle Tone: within normal limits and Telehealth visit Gait & Station: normal, Telehealth visit  Patient leans: N/A  Psychiatric Specialty Exam: Review of Systems  There were no vitals taken for this visit.There is no height or weight on file to calculate BMI.  General Appearance: Well Groomed  Eye Contact:  Good  Speech:  Clear and Coherent and Normal Rate  Volume:  Normal  Mood:  Euthymic  Affect:  Appropriate and Congruent  Thought Process:  Coherent, Goal Directed, and Linear  Orientation:  Full (Time, Place, and Person)  Thought Content:  WDL and Logical  Suicidal Thoughts:  No  Homicidal Thoughts:  No  Memory:  Immediate;   Good Recent;   Good Remote;   Good  Judgement:  Good  Insight:  Good  Psychomotor Activity:  Normal  Concentration:  Concentration: Good and Attention Span: Good  Recall:  Fair  Fund of Knowledge:Good  Language: Good  Akathisia:  No  Handed:  Right  AIMS (if indicated):  not done  Assets:  Communication Skills Desire for Improvement Financial Resources/Insurance Housing Intimacy Leisure Time Physical Health Social Support  ADL's:  Intact  Cognition: WNL  Sleep:  Good   Screenings: AUDIT    Flowsheet Row Office Visit from 04/03/2023 in [facility]  Alcohol Use Disorder Identification Test Final Score (AUDIT) 8       GAD-7    Flowsheet Row Office Visit from 09/18/2023 in  [facility] Office Visit from 08[facility]09/2023 in [facility] Office Visit from 07/09/2023 in [facility] Office Visit from 10/23/2022 in [facility] Office Visit from 12/01/2020 in [facility]  Total GAD-7 Score 9 1 8 3  0      PHQ2-9    Flowsheet Row Office Visit from 09/18/2023 in [facility] Office Visit from 08/09/2023 in [facility] Office Visit from 07/09/2023 in [facility] Counselor from 06/04/2023 in [facility] Office Visit from 10/23/2022 in [facility]  PHQ-2 Total Score 0 0 4 2 2   PHQ-9 Total Score 2 1 13 8 6       Flowsheet Row Office Visit from 09/18/2023 in [facility] Admission (Discharged) from 09/03/2023 in [facility] Counselor from 06/04/2023 in [facility]  C-SSRS RISK CATEGORY No Risk No Risk No Risk       Assessment and Plan: Patient notes that she is doing well on her current medication regimen.  She inform her that her PCP wanted provider to look over her medications.  She does note at times she has been irritable.  Provider informed patient that Celebrex and Lyrica can cause issues with concentration and memory.  Provider also informed patient that Ambien can cause confusion and issues with memory.  Provider discussed discontinuing Ambien and utilizing Seroquel to help manage sleep and mood.  She notes that she will discuss this with her PCP.  Provider informed patient that if her memory does not improve and to discuss with her PCP having a referral to neurology.  She endorsed understanding and agreed.  Patient does have a family history of dementia.  1. Generalized anxiety disorder (Primary)   2. Mild depression     Collaboration of Care: Other provider involved in patient's care AEB PCP  Patient/Guardian was advised Release of Information must be  obtained prior to any record release in order to collaborate their care with an outside provider. Patient/Guardian was advised if they have not already done so to contact the registration department to sign all necessary forms in order for [place] to release information regarding their care.   Consent: Patient/Guardian gives verbal consent for treatment and assignment of benefits for services provided during this visit. Patient/Guardian expressed understanding and agreed to proceed.   Follow-up as needed Follow-up with therapy Shanna Cisco, NP 3/25/202511:46 AM

## 2023-09-23 ENCOUNTER — Other Ambulatory Visit: Payer: Self-pay | Admitting: Family Medicine

## 2023-09-23 DIAGNOSIS — M797 Fibromyalgia: Secondary | ICD-10-CM

## 2023-09-24 ENCOUNTER — Other Ambulatory Visit: Payer: Self-pay | Admitting: Family Medicine

## 2023-09-24 DIAGNOSIS — M797 Fibromyalgia: Secondary | ICD-10-CM

## 2023-09-24 MED ORDER — PREGABALIN 75 MG PO CAPS
75.0000 mg | ORAL_CAPSULE | Freq: Two times a day (BID) | ORAL | 0 refills | Status: AC
Start: 2023-09-24 — End: ?

## 2023-09-26 ENCOUNTER — Ambulatory Visit (INDEPENDENT_AMBULATORY_CARE_PROVIDER_SITE_OTHER): Payer: Medicaid Other | Admitting: Podiatry

## 2023-09-26 DIAGNOSIS — Z9889 Other specified postprocedural states: Secondary | ICD-10-CM

## 2023-09-26 DIAGNOSIS — M2012 Hallux valgus (acquired), left foot: Secondary | ICD-10-CM

## 2023-09-26 NOTE — Progress Notes (Signed)
 Subjective:  Patient ID: Denise Morales, female    DOB: [birth year],  MRN: [id number]  Chief Complaint  Patient presents with   Routine Post Op    DOS 09/03/23 --- Left Lapidus/Lapiplasty with possible phalangeal osteotomy with fixation and left steroid injection dorsal midfoot    DOS: 09/03/2023 Procedure: Left Lapidus bunionectomy  63 y.o. female returns for post-op check.  Patient states that she is doing well.  Pain is controlled.  Minimal pain bandages clean dry and intact ambulating with a cam boot weight to the heel  Review of Systems: Negative except as noted in the HPI. Denies N/V/F/Ch.  Past Medical History:  Diagnosis Date   Allergy    Anemia    Anxiety    Arthritis    Bunion of left foot 07/2023   Depression    Essential hypertension    Fibromyalgia    GERD (gastroesophageal reflux disease)    Heart murmur    History of hiatal hernia    Hyperlipidemia    Hypothyroidism    Iron deficiency anemia    Melanoma (HCC)    face   Pre-diabetes     Current Outpatient Medications:    acetaminophen (TYLENOL) 500 MG tablet, Take 500-1,000 mg by mouth See admin instructions. Take 2 tablets (1000 mg) by mouth scheduled at bedtime & every 6 hours if needed for pain., Disp: , Rfl:    busPIRone (BUSPAR) 7.5 MG tablet, Take 1 tablet (7.5 mg total) by mouth 2 (two) times daily., Disp: 60 tablet, Rfl: 0   celecoxib (CELEBREX) 100 MG capsule, Take 1 capsule (100 mg total) by mouth 2 (two) times daily., Disp: 180 capsule, Rfl: 1   dicyclomine (BENTYL) 10 MG capsule, Take 1 capsule (10 mg total) by mouth 4 (four) times daily as needed for spasms. (Patient taking differently: Take 10 mg by mouth in the morning and at bedtime.), Disp: 270 capsule, Rfl: 0   escitalopram (LEXAPRO) 20 MG tablet, Take 1 tablet by mouth once daily (Patient taking differently: Take 20 mg by mouth at bedtime.), Disp: 90 tablet, Rfl: 0   KRILL OIL PO, Take 500 mg by mouth in the morning., Disp: , Rfl:     levothyroxine (SYNTHROID) 50 MCG tablet, Take 1 tablet (50 mcg total) by mouth daily., Disp: 90 tablet, Rfl: 3   lisinopril-hydrochlorothiazide (ZESTORETIC) 20-12.5 MG tablet, Take 1 tablet by mouth daily., Disp: 90 tablet, Rfl: 3   oxyCODONE-acetaminophen (PERCOCET) 5-325 MG tablet, Take 1 tablet by mouth every 4 (four) hours as needed for severe pain (pain score 7-10)., Disp: 30 tablet, Rfl: 0   pravastatin (PRAVACHOL) 40 MG tablet, Take 1 tablet by mouth once daily, Disp: 90 tablet, Rfl: 0   pregabalin (LYRICA) 75 MG capsule, Take 1 capsule (75 mg total) by mouth 2 (two) times daily., Disp: 120 capsule, Rfl: 0   QUEtiapine (SEROQUEL) 50 MG tablet, TAKE 1 TABLET BY MOUTH AT BEDTIME ON MONDAYS, WEDNESDAYS , FRIDAYS AND  SATURDAYS, Disp: 12 tablet, Rfl: 0   TURMERIC PO, Take 1,500 mg by mouth in the morning., Disp: , Rfl:    Vitamin D-Vitamin K (VITAMIN K2-VITAMIN D3 PO), Take 1 tablet by mouth in the morning. 100 mg/50 mcg, Disp: , Rfl:    zolpidem (AMBIEN) 5 MG tablet, TAKE 1 TABLET BY MOUTH EVERY OTHER NIGHT ALTERNATING WITH QUETIAPINE, Disp: 15 tablet, Rfl: 0  Social History   Tobacco Use  Smoking Status Never   Passive exposure: Past  Smokeless Tobacco Never    Allergies  Allergen Reactions   Hydroxyzine Other (See Comments)    hallucinations   Objective:  There were no vitals filed for this visit. There is no height or weight on file to calculate BMI. Constitutional Well developed. Well nourished.  Vascular Foot warm and well perfused. Capillary refill normal to all digits.   Neurologic Normal speech. Oriented to person, place, and time. Epicritic sensation to light touch grossly present bilaterally.  Dermatologic Skin complete epithelialized.  Reduction of bunion deformity noted.  No recurrence noted.  Orthopedic: Very mild tenderness to palpation noted about the surgical site.   Radiographs: 3 views of skeletally mature adult left: Reduction of bunion deformity noted  reduction of sesamoid position noted.  Good correction alignment noted hardware is intact Assessment:   No diagnosis found.  Plan:  Patient was evaluated and treated and all questions answered.  S/p foot surgery left - Clinically healed and officially discharged from my care.  I encouraged her to continue doing range of motion exercises for the first metatarsophalangeal joint she states understanding if any foot and ankle issues in the future she will come back and see me.  No follow-ups on file.

## 2023-10-22 ENCOUNTER — Telehealth: Payer: Self-pay | Admitting: Podiatry

## 2023-10-22 ENCOUNTER — Ambulatory Visit (INDEPENDENT_AMBULATORY_CARE_PROVIDER_SITE_OTHER): Payer: Medicaid Other | Admitting: Clinical

## 2023-10-22 DIAGNOSIS — F411 Generalized anxiety disorder: Secondary | ICD-10-CM

## 2023-10-22 MED ORDER — GABAPENTIN 300 MG PO CAPS: 300.0000 mg | ORAL_CAPSULE | Freq: Three times a day (TID) | ORAL | 3 refills | Status: AC

## 2023-10-22 NOTE — Telephone Encounter (Signed)
 Surgery 3/10 - have area oR side foot burning and pain constantly and hurts to walk on it. What should she do?

## 2023-10-22 NOTE — Progress Notes (Signed)
   THERAPIST PROGRESS NOTE  Session Time: 40 minutes  Participation Level: Active  Behavioral Response: CasualAlertAnxious  Type of Therapy: Individual Therapy  Treatment Goals addressed:  Increase coping skills to manage depression and improve ability to perform daily activities by self report 1x per session   ProgressTowards Goals: Progressing  Interventions: CBT and Supportive  Summary:  Nyobi Herriage is a 63 y.o. female who presents for the scheduled appointment oriented x 5, appropriately dressed, friendly.  Client denied hallucinations and delusions. Client reported on today she has been doing fairly okay but experiencing some stress.  Client reported following the procedure on her left foot she is still experiencing some tingling sensations that she is going to follow-up with her doctor about.  Client reported otherwise she is able to walk on her own just fine.  Client reported the primary stressor going on within her immediate family is pertaining to her 17 year old grandson.  Client reported he has autism and as far as a known who is always on his computer due to a fascination of space.  Client reported within the past month they found out that he was talking to an alleged 65 year old who had been teaching him about being gay.  Client reported it has been difficult for her daughter and her husband to figure out how to handle the situation moving forward.  Client reported her grandson is in therapy.  Client reported although she does not necessarily agree with the lifestyle she would still like her grandson the same.  Client reported he is very close with her and think she is the only one who would be able to get him to open up to discuss his views on the topic. Client reported her grandson showed no signs of possibly being gay so it surprised everyone. Evidence of progress towards goal:  client reported 1 positive of appropriately handling a stressor with appropriate communication  skills.   Suicidal/Homicidal: Nowithout intent/plan  Therapist Response:  Therapist began the appointment asking client how she has been doing since last seen. Therapist engaged with active listening and positive emotional support. Therapist used CBT to give the client time to discuss her thoughts and feelings about her health and recent stressors within her family. Therapist used CBT to normalize the clients thoughts and emotions. Therapist used CBT to teach the client about how to communicate her thoughts appropriately. Therapist used CBT ask the client to identify her progress with frequency of use with coping skills with continued practice in her daily activity.    Therapist assigned the client homework to practice self care.   Plan: Return again in 4 weeks.  Diagnosis: GAD  Collaboration of Care: Patient refused AEB none requested by the client.  Patient/Guardian was advised Release of Information must be obtained prior to any record release in order to collaborate their care with an outside provider. Patient/Guardian was advised if they have not already done so to contact the registration department to sign all necessary forms in order for us  to release information regarding their care.   Consent: Patient/Guardian gives verbal consent for treatment and assignment of benefits for services provided during this visit. Patient/Guardian expressed understanding and agreed to proceed.   Clance Baquero Y Macklyn Glandon, LCSW 10/22/2023

## 2023-10-26 ENCOUNTER — Other Ambulatory Visit: Payer: Self-pay | Admitting: Family Medicine

## 2023-10-29 ENCOUNTER — Other Ambulatory Visit: Payer: Self-pay | Admitting: Family Medicine

## 2023-10-29 MED ORDER — DICYCLOMINE HCL 10 MG PO CAPS
10.0000 mg | ORAL_CAPSULE | Freq: Two times a day (BID) | ORAL | 0 refills | Status: DC | PRN
Start: 2023-10-29 — End: 2024-02-18

## 2023-11-04 ENCOUNTER — Other Ambulatory Visit: Payer: Self-pay | Admitting: Family Medicine

## 2023-11-04 DIAGNOSIS — Z760 Encounter for issue of repeat prescription: Secondary | ICD-10-CM

## 2023-11-13 ENCOUNTER — Other Ambulatory Visit (HOSPITAL_COMMUNITY): Payer: Self-pay

## 2023-11-21 ENCOUNTER — Ambulatory Visit (INDEPENDENT_AMBULATORY_CARE_PROVIDER_SITE_OTHER): Admitting: Clinical

## 2023-11-21 DIAGNOSIS — F32A Depression, unspecified: Secondary | ICD-10-CM

## 2023-11-21 NOTE — Progress Notes (Signed)
 THERAPIST PROGRESS NOTE Virtual Visit via Video Note  I connected with Denise Morales on 11/21/2023 at  1:00 PM EDT by a video enabled telemedicine application and verified that I am speaking with the correct person using two identifiers.  Location: Patient: home Provider: office   I discussed the limitations of evaluation and management by telemedicine and the availability of in person appointments. The patient expressed understanding and agreed to proceed.   Follow Up Instructions: I discussed the assessment and treatment plan with the patient. The patient was provided an opportunity to ask questions and all were answered. The patient agreed with the plan and demonstrated an understanding of the instructions.   The patient was advised to call back or seek an in-person evaluation if the symptoms worsen or if the condition fails to improve as anticipated.   Session Time: 25 minutes  Participation Level: Active  Behavioral Response: CasualAlertAnxious  Type of Therapy: Individual Therapy  Treatment Goals addressed:  Increase coping skills to manage depression and improve ability to perform daily activities by self report 1x per session   ProgressTowards Goals: Progressing  Interventions: CBT and Supportive  Summary:  Denise Morales is a 63 y.o. female who presents for the scheduled appointment oriented x 5, appropriately dressed, and friendly.  Client denied hallucinations and delusions. Client reported on today she has been doing fairly okay.  Client reported cousin that she previously mentioned diagnosed with cancer has not been given a prognosis of approximately 3 to 4 months.  Client reported she is sad about that. Client reported she has also been worried about her husband who is having his own health issues. Client reported the doctors are worried that he has a blockage which has caused his feet to swell and become red.  Client reported she is concerned because her husband  is diabetic and that means that he could lose his foot.  Client reported he can hardly walk. Client reported the family is also ongoing still working through the incident with her grandson. Client reported her daughter, his mom is still struggling to cope with the situation and not knowing how to move forward. Client reported she does not want to overstep her boundaries with talking her daughter through the process. Client reported they have had a talk with him as a family about Internet safety and appropriateness and what being gay means. Client reported otherwise she is doing well and has recovered from her procedures. Evidence of progress towards goal: Client reported 1 positive of appropriately communicating and supporting her family during a stressful time as well as taking care of herself appropriately.  Suicidal/Homicidal: Nowithout intent/plan  Therapist Response:  Therapist began the appointment asking the client how she has been doing since last seen. Therapist engaged with active listening and positive emotional support. Therapist used cbt to ask the client about her management of stressors and gave her feedback on her inquiry of how to help her family as it relates to her. Therapist used cbt to normalize her thoughts and feelings. Therapist used CBT ask the client to identify her progress with frequency of use with coping skills with continued practice in her daily activity.    Therapist assigned the client homework to practice self care.   Plan: Return again in 4 weeks.  Diagnosis: mild depression  Collaboration of Care: Patient refused AEB none requested by the client.  Patient/Guardian was advised Release of Information must be obtained prior to any record release in order to collaborate their care  with an outside provider. Patient/Guardian was advised if they have not already done so to contact the registration department to sign all necessary forms in order for us  to release  information regarding their care.   Consent: Patient/Guardian gives verbal consent for treatment and assignment of benefits for services provided during this visit. Patient/Guardian expressed understanding and agreed to proceed.   Lexany Belknap Y Matheo Rathbone, LCSW 11/21/2023

## 2023-11-24 ENCOUNTER — Other Ambulatory Visit: Payer: Self-pay | Admitting: Family Medicine

## 2023-11-26 ENCOUNTER — Other Ambulatory Visit: Payer: Self-pay | Admitting: Family Medicine

## 2023-11-26 ENCOUNTER — Telehealth: Payer: Self-pay

## 2023-11-26 NOTE — Telephone Encounter (Signed)
 Copied from CRM [id number]. Topic: Clinical - Medication Question >> Nov 26, 2023 12:38 PM Lizabeth Riggs wrote: Reason for CRM:  Tykeshia wants to see if FNP Gwen Lek will order QUEtiapine  (SEROQUEL ) 50 MG tablet for a 90 day supply. If she cannot do 90 day supply, please to 30 day supply. She is no longer taking the Ambien  5 MG. Please send to [facility] listed in her chart. Thanks

## 2023-11-30 ENCOUNTER — Other Ambulatory Visit: Payer: Self-pay | Admitting: Family Medicine

## 2023-11-30 NOTE — Telephone Encounter (Signed)
 Copied from CRM [id number]. Topic: Clinical - Medication Refill >> Nov 30, 2023  4:31 PM Denise Morales wrote: Medication: QUEtiapine  (SEROQUEL ) 50 MG tablet   Has the patient contacted their pharmacy? Yes (Agent: If no, request that the patient contact the pharmacy for the refill. If patient does not wish to contact the pharmacy document the reason why and proceed with request.) (Agent: If yes, when and what did the pharmacy advise?)  This is the patient's preferred pharmacy:  [id number] - [place] - [address] [place] ([place])  [address] Phone: [phone] Fax: [phone]  Is this the correct pharmacy for this prescription? Yes If no, delete pharmacy and type the correct one.   Has the prescription been filled recently? Yes  Is the patient out of the medication? Yes  Has the patient been seen for an appointment in the last year OR does the patient have an upcoming appointment? Yes  Can we respond through MyChart? Yes  Agent: Please be advised that Rx refills may take up to 3 business days. We ask that you follow-up with your pharmacy.

## 2023-12-03 NOTE — Telephone Encounter (Signed)
 Copied from CRM [id number]. Topic: Clinical - Medication Question >> Dec 03, 2023 11:18 AM Alpha Arts wrote: Reason for CRM: Patient is wanting to make sure there was a 30 day or 90 day supply sent in for QUEtiapine  (SEROQUEL ) 50 MG tablet.   Callback #:  [id number]

## 2023-12-03 NOTE — Telephone Encounter (Signed)
 Requested medication (s) are due for refill today: Yes  Requested medication (s) are on the active medication list: Yes  Last refill:  04/09/23  Future visit scheduled: Yes  Notes to clinic:  Unable to refill per protocol, cannot delegate.      Requested Prescriptions  Pending Prescriptions Disp Refills   QUEtiapine  (SEROQUEL ) 50 MG tablet 12 tablet 0    Sig: Take 1 tablet (50 mg total) by mouth.     Not Delegated - Psychiatry:  Antipsychotics - Second Generation (Atypical) - quetiapine  Failed - 12/03/2023 12:04 PM      Failed - This refill cannot be delegated      Failed - Lipid Panel in normal range within the last 12 months    Cholesterol, Total  Date Value Ref Range Status  03/30/2020 157 100 - 199 mg/dL Final   Cholesterol  Date Value Ref Range Status  03/30/2023 166 <200 mg/dL Final   LDL Cholesterol (Calc)  Date Value Ref Range Status  03/30/2023 84 mg/dL (calc) Final    Comment:    Reference range: <100 . Desirable range <100 mg/dL for primary prevention;   <70 mg/dL for patients with CHD or diabetic patients  with > or = 2 CHD risk factors. Aaron Aas LDL-C is now calculated using the Martin-Hopkins  calculation, which is a validated novel method providing  better accuracy than the Friedewald equation in the  estimation of LDL-C.  Melinda Sprawls et al. Erroll Heard. 0865;784(69): 2061-2068  (http://education.[facility].com/faq/FAQ164)    HDL  Date Value Ref Range Status  03/30/2023 49 (L) > OR = 50 mg/dL Final  62/95/2841 47 >32 mg/dL Final   Triglycerides  Date Value Ref Range Status  03/30/2023 250 (H) <150 mg/dL Final    Comment:    . If a non-fasting specimen was collected, consider repeat triglyceride testing on a fasting specimen if clinically indicated.  Imagene Mam et al. J. of Clin. Lipidol. 2015;9:129-169. .          Failed - CMP within normal limits and completed in the last 12 months    Albumin   Date Value Ref Range Status  10/09/2022 4.2 3.5 -  5.0 g/dL Final  44/06/270 4.6 3.8 - 4.9 g/dL Final   Alkaline Phosphatase  Date Value Ref Range Status  10/09/2022 110 38 - 126 U/L Final   Alkaline phosphatase (APISO)  Date Value Ref Range Status  07/09/2023 113 37 - 153 U/L Final   ALT  Date Value Ref Range Status  07/09/2023 9 6 - 29 U/L Final   AST  Date Value Ref Range Status  07/09/2023 12 10 - 35 U/L Final   BUN  Date Value Ref Range Status  08/27/2023 20 8 - 23 mg/dL Final  53/66/4403 24 8 - 27 mg/dL Final   Calcium  Date Value Ref Range Status  08/27/2023 9.1 8.9 - 10.3 mg/dL Final   Calcium, Ion  Date Value Ref Range Status  06/05/2022 1.10 (L) 1.15 - 1.40 mmol/L Final   CO2  Date Value Ref Range Status  08/27/2023 26 22 - 32 mmol/L Final   TCO2  Date Value Ref Range Status  06/05/2022 23 22 - 32 mmol/L Final   Creat  Date Value Ref Range Status  07/09/2023 0.96 0.50 - 1.05 mg/dL Final   Creatinine, Ser  Date Value Ref Range Status  08/27/2023 0.96 0.44 - 1.00 mg/dL Final   Glucose, Bld  Date Value Ref Range Status  08/27/2023 116 (H) 70 - 99 mg/dL  Final    Comment:    Glucose reference range applies only to samples taken after fasting for at least 8 hours.   Potassium  Date Value Ref Range Status  08/27/2023 3.8 3.5 - 5.1 mmol/L Final   Sodium  Date Value Ref Range Status  08/27/2023 140 135 - 145 mmol/L Final  01/01/2020 141 134 - 144 mmol/L Final   Total Bilirubin  Date Value Ref Range Status  07/09/2023 0.3 0.2 - 1.2 mg/dL Final   Bilirubin Total  Date Value Ref Range Status  01/01/2020 0.3 0.0 - 1.2 mg/dL Final   Protein, ur  Date Value Ref Range Status  04/11/2023 NEGATIVE NEGATIVE mg/dL Final   Total Protein  Date Value Ref Range Status  07/09/2023 7.2 6.1 - 8.1 g/dL Final  96/09/5407 6.9 6.0 - 8.5 g/dL Final   GFR calc Af Amer  Date Value Ref Range Status  01/01/2020 60 >59 mL/min/1.73 Final    Comment:    **[facility] currently reports eGFR in compliance with the  current**   recommendations of the [employer]. [facility] will   update reporting as new guidelines are published from the NKF-ASN   Task force.    eGFR  Date Value Ref Range Status  07/09/2023 66 > OR = 60 mL/min/1.2m2 Final   GFR, Estimated  Date Value Ref Range Status  08/27/2023 >60 >60 mL/min Final    Comment:    (NOTE) Calculated using the CKD-EPI Creatinine Equation (2021)          Passed - TSH in normal range and within 360 days    TSH  Date Value Ref Range Status  03/30/2023 3.05 0.40 - 4.50 mIU/L Final         Passed - Completed PHQ-2 or PHQ-9 in the last 360 days      Passed - Last BP in normal range    BP Readings from Last 1 Encounters:  09/03/23 127/77         Passed - Last Heart Rate in normal range    Pulse Readings from Last 1 Encounters:  09/03/23 72         Passed - Valid encounter within last 6 months    Recent Outpatient Visits           3 months ago Essential hypertension   [facility] Jenelle Mis, FNP   3 months ago Essential hypertension   [facility] Jenelle Mis, FNP   4 months ago Essential hypertension   [facility] Jenelle Mis, FNP   8 months ago Essential hypertension   [facility] Jenelle Mis, FNP   11 months ago Fibromyalgia syndrome   [facility] Gwen Lek, Schuyler Custard, FNP              Passed - CBC within normal limits and completed in the last 12 months    WBC  Date Value Ref Range Status  07/09/2023 8.1 3.8 - 10.8 Thousand/uL Final   RBC  Date Value Ref Range Status  07/09/2023 4.90 3.80 - 5.10 Million/uL Final   Hemoglobin  Date Value Ref Range Status  07/09/2023 14.1 11.7 - 15.5 g/dL Final  81/19/1478 29.5 11.1 - 15.9 g/dL Final   HCT  Date Value Ref Range Status  07/09/2023 42.8 35.0 - 45.0 % Final   Hematocrit  Date Value Ref Range  Status  01/01/2020 40.5 34.0 - 46.6 % Final   MCHC  Date Value Ref Range Status  07/09/2023 32.9 32.0 - 36.0 g/dL Final    Comment:    For adults, a slight decrease in the calculated MCHC value (in the range of 30 to 32 g/dL) is most likely not clinically significant; however, it should be interpreted with caution in correlation with other red cell parameters and the patient's clinical condition.    [facility]  Date Value Ref Range Status  07/09/2023 28.8 27.0 - 33.0 pg Final   MCV  Date Value Ref Range Status  07/09/2023 87.3 80.0 - 100.0 fL Final  01/01/2020 84 79 - 97 fL Final   No results found for: "PLTCOUNTKUC", "LABPLAT", "POCPLA" RDW  Date Value Ref Range Status  07/09/2023 12.4 11.0 - 15.0 % Final  01/01/2020 13.2 11.7 - 15.4 % Final

## 2023-12-03 NOTE — Telephone Encounter (Signed)
 Sent to provider for review

## 2023-12-04 ENCOUNTER — Other Ambulatory Visit: Payer: Self-pay | Admitting: Family Medicine

## 2023-12-04 DIAGNOSIS — E782 Mixed hyperlipidemia: Secondary | ICD-10-CM

## 2023-12-04 MED ORDER — QUETIAPINE FUMARATE 50 MG PO TABS: 50.0000 mg | ORAL_TABLET | Freq: Every day | ORAL | 0 refills | Status: DC

## 2023-12-05 ENCOUNTER — Other Ambulatory Visit (HOSPITAL_COMMUNITY): Payer: Self-pay

## 2023-12-05 ENCOUNTER — Telehealth: Payer: Self-pay | Admitting: Pharmacy Technician

## 2023-12-05 NOTE — Telephone Encounter (Signed)
 Pharmacy Patient Advocate Encounter  Received notification from [facility] that Prior Authorization for QUEtiapine  Fumarate 50MG  tablets has been APPROVED from 12/05/23 to 12/04/24   PA #/Case ID/Reference #: [id number]

## 2023-12-05 NOTE — Telephone Encounter (Signed)
 Pharmacy Patient Advocate Encounter   Received notification from CoverMyMeds that prior authorization for QUEtiapine  Fumarate 50MG  tablets is required/requested.   Insurance verification completed.   The patient is insured through [facility] .   Per test claim: PA required; PA submitted to above mentioned insurance via CoverMyMeds Key/confirmation #/EOC [id number] Status is pending

## 2023-12-05 NOTE — Telephone Encounter (Signed)
 No contact made to inform pt. Of refill. Lvm w/ information.

## 2023-12-06 ENCOUNTER — Other Ambulatory Visit (HOSPITAL_COMMUNITY): Payer: Self-pay

## 2023-12-06 ENCOUNTER — Ambulatory Visit: Payer: Medicaid Other | Admitting: Family Medicine

## 2023-12-10 ENCOUNTER — Encounter: Payer: Self-pay | Admitting: Family Medicine

## 2023-12-10 ENCOUNTER — Ambulatory Visit: Admitting: Family Medicine

## 2023-12-10 ENCOUNTER — Ambulatory Visit (INDEPENDENT_AMBULATORY_CARE_PROVIDER_SITE_OTHER): Admitting: Clinical

## 2023-12-10 VITALS — BP 117/79 | HR 77 | Temp 97.4°F | Ht <= 58 in | Wt 183.8 lb

## 2023-12-10 DIAGNOSIS — F419 Anxiety disorder, unspecified: Secondary | ICD-10-CM

## 2023-12-10 DIAGNOSIS — K5790 Diverticulosis of intestine, part unspecified, without perforation or abscess without bleeding: Secondary | ICD-10-CM | POA: Insufficient documentation

## 2023-12-10 DIAGNOSIS — F339 Major depressive disorder, recurrent, unspecified: Secondary | ICD-10-CM

## 2023-12-10 DIAGNOSIS — F32A Depression, unspecified: Secondary | ICD-10-CM | POA: Diagnosis not present

## 2023-12-10 DIAGNOSIS — I10 Essential (primary) hypertension: Secondary | ICD-10-CM | POA: Diagnosis not present

## 2023-12-10 MED ORDER — AMOXICILLIN-POT CLAVULANATE 875-125 MG PO TABS
1.0000 | ORAL_TABLET | Freq: Two times a day (BID) | ORAL | 0 refills | Status: DC
Start: 2023-12-10 — End: 2024-01-17

## 2023-12-10 NOTE — Assessment & Plan Note (Signed)
Stable on current regimen.  Followed by psychiatry.   

## 2023-12-10 NOTE — Assessment & Plan Note (Signed)
 Known sigmoid diverticulosis. In setting of acute LLQ pain and subjective chills will treat with Augmentin BID x10 days. If symptoms persist or acutely worsen will need abdominal CT. Discussed red flags and when to seek emergent medical care.

## 2023-12-10 NOTE — Progress Notes (Signed)
 Subjective:  HPI: Denise Morales is a 63 y.o. female presenting on 12/10/2023 for Medical Management of Chronic Issues (4-6 mo f/u /Pain in lower abdomen lft side. Going down into lft leg )   HPI Patient is in today for follow up for depression/anxiety and HTN and new LLQ abdominal pain since last week. The pain did radiate into her left thigh and was associated with chills last Thursday. Worse when standing from sitting. Known history of diverticulosis. Denies fever, nausea, vomiting, diarrhea, constipation, melena, hematochezia.   HYPERTENSION without Chronic Kidney Disease Hypertension status: controlled  Satisfied with current treatment? yes Duration of hypertension: chronic BP monitoring frequency:  not checking BP range:  BP medication side effects:  no Medication compliance: excellent compliance Previous BP meds:lisinopril -HCTZ Aspirin : no Recurrent headaches: no Visual changes: no Palpitations: no Dyspnea: no Chest pain: no Lower extremity edema: no Dizzy/lightheaded: no     12/10/2023   11:00 AM 09/18/2023   11:14 AM 08/09/2023    3:49 PM 07/09/2023   11:48 AM  GAD 7 : Generalized Anxiety Score  Nervous, Anxious, on Edge 0  0 2  Control/stop worrying 1  0 2  Worry too much - different things 1  0 2  Trouble relaxing 0  0 2  Restless 0  0 0  Easily annoyed or irritable 0  1 0  Afraid - awful might happen 0  0 0  Total GAD 7 Score 2  1 8   Anxiety Difficulty Somewhat difficult   Somewhat difficult     Information is confidential and restricted. Go to Review Flowsheets to unlock data.       12/10/2023   11:00 AM 09/18/2023   11:11 AM 08/09/2023    3:49 PM 07/09/2023   11:47 AM 06/11/2023    8:11 AM  Depression screen PHQ 2/9  Decreased Interest 1  0 3   Down, Depressed, Hopeless 0  0 1   PHQ - 2 Score 1  0 4   Altered sleeping 0  0 3   Tired, decreased energy 1  1 0   Change in appetite 0  0 0   Feeling bad or failure about yourself  0  0 3   Trouble  concentrating 0  0 0   Moving slowly or fidgety/restless 0  0 3   Suicidal thoughts 0  0 0   PHQ-9 Score 2  1 13    Difficult doing work/chores Somewhat difficult   Somewhat difficult      Information is confidential and restricted. Go to Review Flowsheets to unlock data.     Review of Systems  All other systems reviewed and are negative.   Relevant past medical history reviewed and updated as indicated.   Past Medical History:  Diagnosis Date   Allergy    Anemia    Anxiety    Arthritis    Bunion of left foot 07/2023   Depression    Essential hypertension    Fibromyalgia    GERD (gastroesophageal reflux disease)    Heart murmur    History of hiatal hernia    Hyperlipidemia    Hypothyroidism    Iron  deficiency anemia    Melanoma (HCC)    face   Pre-diabetes      Past Surgical History:  Procedure Laterality Date   ABDOMINAL HYSTERECTOMY  1985   AIKEN OSTEOTOMY Right 06/05/2022   Procedure: Candida Chalk OSTEOTOMY;  Surgeon: Velma Ghazi, DPM;  Location: [facility];  Service:  Podiatry;  Laterality: Right;   BICEPT TENODESIS Right 12/05/2022   Procedure: RIGHT BICEPS TENODESIS;  Surgeon: Jasmine Mesi, MD;  Location: [facility];  Service: Orthopedics;  Laterality: Right;   CAPSULOTOMY METATARSOPHALANGEAL Right 06/05/2022   Procedure: CAPSULOTOMY METATARSOPHALANGEAL;  Surgeon: Velma Ghazi, DPM;  Location: [facility];  Service: Podiatry;  Laterality: Right;   CHOLECYSTECTOMY     COLONOSCOPY     COLONOSCOPY WITH ESOPHAGOGASTRODUODENOSCOPY (EGD)  12/2022   HALLUX VALGUS LAPIDUS Right 06/05/2022   Procedure: HALLUX VALGUS LAPIDUS;  Surgeon: Velma Ghazi, DPM;  Location: [facility];  Service: Podiatry;  Laterality: Right;   HALLUX VALGUS LAPIDUS Left 09/03/2023   Procedure: Bonna Bustard;  Surgeon: Velma Ghazi, DPM;  Location: [facility];  Service: Orthopedics/Podiatry;  Laterality: Left;  POPLITEAL BLOCK   HAMMER TOE  SURGERY Right 06/05/2022   Procedure: HAMMER TOE CORRECTION;  Surgeon: Velma Ghazi, DPM;  Location: [facility];  Service: Podiatry;  Laterality: Right;   JOINT REPLACEMENT  2022 - 2024   REVERSE SHOULDER ARTHROPLASTY Right 12/05/2022   Procedure: RIGHT REVERSE SHOULDER ARTHROPLASTY;  Surgeon: Jasmine Mesi, MD;  Location: [facility];  Service: Orthopedics;  Laterality: Right;   REVERSE SHOULDER ARTHROPLASTY Left 04/16/2023   Procedure: REVERSE SHOULDER ARTHROPLASTY;  Surgeon: Jasmine Mesi, MD;  Location: [facility] OR;  Service: Orthopedics;  Laterality: Left;   SHOULDER ARTHROSCOPY WITH ROTATOR CUFF REPAIR AND SUBACROMIAL DECOMPRESSION Right 08/12/2020   Procedure: RIGHT SHOULDER ARTHROSCOPY WITH ROTATOR CUFF REPAIR AND SUBACROMIAL DECOMPRESSION;  Surgeon: Arnie Lao, MD;  Location: [facility];  Service: Orthopedics;  Laterality: Right;   STERIOD INJECTION Left 09/03/2023   Procedure: STEROID INJECTION;  Surgeon: Velma Ghazi, DPM;  Location: [facility];  Service: Orthopedics/Podiatry;  Laterality: Left;   TOTAL KNEE ARTHROPLASTY Right 12/24/2020   Procedure: RIGHT TOTAL KNEE ARTHROPLASTY;  Surgeon: Arnie Lao, MD;  Location: [facility];  Service: Orthopedics;  Laterality: Right;   TOTAL KNEE ARTHROPLASTY Left 07/15/2021   Procedure: LEFT TOTAL KNEE ARTHROPLASTY;  Surgeon: Arnie Lao, MD;  Location: [facility];  Service: Orthopedics;  Laterality: Left;   TUBAL LIGATION  1987   XI ROBOTIC ASSISTED PARAESOPHAGEAL HERNIA REPAIR N/A 03/12/2023   Procedure: XI ROBOTIC ASSISTED PARAESOPHAGEAL HERNIA REPAIR WITH FUNDOPLICATION;  Surgeon: Adalberto Acton, MD;  Location: [facility];  Service: General;  Laterality: N/A;  180    Allergies and medications reviewed and updated.   Current Outpatient Medications:    acetaminophen  (TYLENOL ) 500 MG tablet, Take 500-1,000 mg by mouth See admin instructions. Take 2 tablets (1000 mg) by mouth  scheduled at bedtime & every 6 hours if needed for pain., Disp: , Rfl:    amoxicillin-clavulanate (AUGMENTIN) 875-125 MG tablet, Take 1 tablet by mouth 2 (two) times daily., Disp: 20 tablet, Rfl: 0   busPIRone  (BUSPAR ) 7.5 MG tablet, Take 1 tablet by mouth twice daily, Disp: 60 tablet, Rfl: 0   celecoxib  (CELEBREX ) 100 MG capsule, Take 1 capsule (100 mg total) by mouth 2 (two) times daily., Disp: 180 capsule, Rfl: 1   dicyclomine  (BENTYL ) 10 MG capsule, Take 1 capsule (10 mg total) by mouth 2 (two) times daily as needed (abdominal pain)., Disp: 180 capsule, Rfl: 0   escitalopram  (LEXAPRO ) 20 MG tablet, Take 1 tablet by mouth once daily, Disp: 90 tablet, Rfl: 0   gabapentin  (NEURONTIN ) 300 MG capsule, Take 1 capsule (300 mg total) by mouth 3 (three) times daily., Disp:  90 capsule, Rfl: 3   KRILL OIL PO, Take 500 mg by mouth in the morning., Disp: , Rfl:    levothyroxine  (SYNTHROID ) 50 MCG tablet, Take 1 tablet (50 mcg total) by mouth daily., Disp: 90 tablet, Rfl: 3   lisinopril -hydrochlorothiazide  (ZESTORETIC ) 20-12.5 MG tablet, Take 1 tablet by mouth daily., Disp: 90 tablet, Rfl: 3   pravastatin  (PRAVACHOL ) 40 MG tablet, Take 1 tablet by mouth once daily, Disp: 90 tablet, Rfl: 0   QUEtiapine  (SEROQUEL ) 50 MG tablet, Take 1 tablet (50 mg total) by mouth at bedtime., Disp: 12 tablet, Rfl: 0   TURMERIC PO, Take 1,500 mg by mouth in the morning., Disp: , Rfl:    Vitamin D -Vitamin K (VITAMIN K2 -VITAMIN D3 PO), Take 1 tablet by mouth in the morning. 100 mg/50 mcg, Disp: , Rfl:   Allergies  Allergen Reactions   Hydroxyzine  Other (See Comments)    hallucinations    Objective:   BP 117/79   Pulse 77   Temp (!) 97.4 F (36.3 C)   Ht 4' 10 (1.473 m)   Wt 183 lb 12.8 oz (83.4 kg)   SpO2 97%   BMI 38.41 kg/m      12/10/2023   10:55 AM 09/03/2023   12:24 PM 09/03/2023   12:09 PM  Vitals with BMI  Height 4' 10    Weight 183 lbs 13 oz    BMI 38.42    Systolic 117 127 161  Diastolic 79 77 72   Pulse 77 72 72     Physical Exam Vitals and nursing note reviewed.  Constitutional:      Appearance: Normal appearance. She is normal weight.  HENT:     Head: Normocephalic and atraumatic.   Cardiovascular:     Rate and Rhythm: Normal rate and regular rhythm.     Pulses: Normal pulses.     Heart sounds: Normal heart sounds.  Pulmonary:     Effort: Pulmonary effort is normal.     Breath sounds: Normal breath sounds.  Abdominal:     General: Bowel sounds are normal. There is no distension.     Palpations: Abdomen is soft.     Tenderness: There is abdominal tenderness in the left lower quadrant.   Skin:    General: Skin is warm and dry.   Neurological:     General: No focal deficit present.     Mental Status: She is alert and oriented to person, place, and time. Mental status is at baseline.   Psychiatric:        Mood and Affect: Mood normal.        Behavior: Behavior normal.        Thought Content: Thought content normal.        Judgment: Judgment normal.     Assessment & Plan:  Diverticulosis Assessment & Plan: Known sigmoid diverticulosis. In setting of acute LLQ pain and subjective chills will treat with Augmentin BID x10 days. If symptoms persist or acutely worsen will need abdominal CT. Discussed red flags and when to seek emergent medical care.    Essential hypertension Assessment & Plan: Chronic controlled on Lisinopril  hydrochlorothiazide  20-12.5mg  daily. Continue heart healthy diet such as Mediterranean diet with whole grains, fruits, vegetable, fish, lean meats, nuts, and olive oil. Limit salt. Encouraged moderate walking, 3-5 times/week for 30-50 minutes each session. Aim for at least 150 minutes.week. Goal should be pace of 3 miles/hours, or walking 1.5 miles in 30 minutes. Avoid tobacco products. Avoid excess alcohol. Take  medications as prescribed and bring medications and blood pressure log with cuff to each office visit. Seek medical care for chest pain,  palpitations, shortness of breath with exertion, dizziness/lightheadedness, vision changes, recurrent headaches, or swelling of extremities. Follow up for CPE ASAP   Depression, recurrent (HCC) Assessment & Plan: Stable on Lexapro  however endorses lack of motivation and anhedonia. Will consider medication changes. Is followed by psychiatry.    Anxiety Assessment & Plan: Stable on current regimen. Followed by psychiatry.   Other orders -     Amoxicillin-Pot Clavulanate; Take 1 tablet by mouth 2 (two) times daily.  Dispense: 20 tablet; Refill: 0     Follow up plan: Return for annual physical with labs 1 week prior.  Jenelle Mis, FNP

## 2023-12-10 NOTE — Assessment & Plan Note (Signed)
 Stable on Lexapro  however endorses lack of motivation and anhedonia. Will consider medication changes. Is followed by psychiatry.

## 2023-12-10 NOTE — Assessment & Plan Note (Signed)
 Chronic controlled on Lisinopril  hydrochlorothiazide  20-12.5mg  daily. Continue heart healthy diet such as Mediterranean diet with whole grains, fruits, vegetable, fish, lean meats, nuts, and olive oil. Limit salt. Encouraged moderate walking, 3-5 times/week for 30-50 minutes each session. Aim for at least 150 minutes.week. Goal should be pace of 3 miles/hours, or walking 1.5 miles in 30 minutes. Avoid tobacco products. Avoid excess alcohol. Take medications as prescribed and bring medications and blood pressure log with cuff to each office visit. Seek medical care for chest pain, palpitations, shortness of breath with exertion, dizziness/lightheadedness, vision changes, recurrent headaches, or swelling of extremities. Follow up for CPE ASAP

## 2023-12-10 NOTE — Progress Notes (Signed)
 THERAPIST PROGRESS NOTE  Session Time:  50 minutes  Participation Level: Active  Behavioral Response: CasualAlertAnxious  Type of Therapy: Individual Therapy  Treatment Goals addressed: Client will engage in at least 80% of scheduled individual psychotherapy sessions  ProgressTowards Goals: Progressing  Interventions: CBT and Supportive  Summary:  Denise Morales is a 63 y.o. female who presents for the scheduled appointment oriented x 5, appropriately dressed, and friendly.  Client denied hallucinations and delusions. Client reported on today a lot has been going on.  Client reported her doctor decided to change her depression medication because she constantly feels tired.  Client reported she does not know if it is from the medication or if it is a mental thing.  Client reported she sits and thinks about all the things that she wants to do but she has no motivation to get up and do it.  Client reported over the past week she has gone through a family outing, did yard work, and has taken her husband to the doctor majority of the week.  Client reported on today that she did manage to get up and do some laundry and vacuuming.  Client reported she is also worried about her daughter and grandson's situation.  Client reported although her grandson is in therapy her daughter has still not given her grandson back his electronic devices since the issue with an older man talking to him on the Internet.  Client reported she hurts for him as well as for her daughter for how everyone was feeling the situation.  Client reported she has a pattern of worrying for other people and putting herself on the back burner.  Client reported with her husband's health diagnoses she is also been emotionally hard for him as his children seem to be more consumed with her own life and spending quality time with their dad.  Client reported she does not know how to balance at all emotionally and give time to taking care of  herself or doing things that she enjoys and feeling present. Evidence of progress towards goal: Client reported 1 cognitive pattern of prioritizing other people's issues and needs ahead of her own which contributes to her feelings of burnout.   Suicidal/Homicidal: Nowithout intent/plan  Therapist Response:  Therapist began the appointment asking the client how she has been doing since last seen. Therapist engaged with active listening and positive emotional support. Therapist used CBT to engage and give the client time to express her thoughts and feelings about her concerns with family and managing her own care of herself. Therapist used CBT to normalize the clients thoughts and feelings and engage in discussion with her about except things and shifting how she prioritizes certain issues. Therapist used CBT ask the client to identify her progress with frequency of use with coping skills with continued practice in her daily activity.    Therapist assigned client homework to practice the self-care skills discussed.   Plan: Return again in 4 weeks.  Diagnosis: mild depression  Collaboration of Care: Referral or follow-up with counselor/therapist [facility]  Patient/Guardian was advised Release of Information must be obtained prior to any record release in order to collaborate their care with an outside provider. Patient/Guardian was advised if they have not already done so to contact the registration department to sign all necessary forms in order for us  to release information regarding their care.   Consent: Patient/Guardian gives verbal consent for treatment and assignment of benefits for services provided during this visit. Patient/Guardian  expressed understanding and agreed to proceed.   Pria Klosinski Y Marvia Troost, LCSW 12/10/2023

## 2023-12-17 ENCOUNTER — Other Ambulatory Visit: Payer: Self-pay | Admitting: Family Medicine

## 2023-12-17 DIAGNOSIS — Z760 Encounter for issue of repeat prescription: Secondary | ICD-10-CM

## 2023-12-17 MED ORDER — ESCITALOPRAM OXALATE 5 MG PO TABS: ORAL_TABLET | ORAL | 0 refills | Status: DC

## 2023-12-17 MED ORDER — BUPROPION HCL ER (XL) 150 MG PO TB24: 150.0000 mg | ORAL_TABLET | ORAL | 0 refills | Status: DC

## 2023-12-22 ENCOUNTER — Other Ambulatory Visit: Payer: Self-pay | Admitting: Family Medicine

## 2023-12-25 ENCOUNTER — Other Ambulatory Visit: Payer: Self-pay | Admitting: Family Medicine

## 2023-12-25 DIAGNOSIS — Z1231 Encounter for screening mammogram for malignant neoplasm of breast: Secondary | ICD-10-CM

## 2024-01-01 ENCOUNTER — Ambulatory Visit (HOSPITAL_COMMUNITY): Admitting: Clinical

## 2024-01-01 ENCOUNTER — Encounter (HOSPITAL_COMMUNITY): Payer: Self-pay

## 2024-01-16 ENCOUNTER — Ambulatory Visit

## 2024-01-17 ENCOUNTER — Encounter: Payer: Self-pay | Admitting: Family Medicine

## 2024-01-17 ENCOUNTER — Ambulatory Visit: Admitting: Family Medicine

## 2024-01-17 VITALS — BP 124/82 | HR 72 | Ht <= 58 in | Wt 181.5 lb

## 2024-01-17 DIAGNOSIS — H6690 Otitis media, unspecified, unspecified ear: Secondary | ICD-10-CM | POA: Diagnosis not present

## 2024-01-17 DIAGNOSIS — H6122 Impacted cerumen, left ear: Secondary | ICD-10-CM | POA: Diagnosis not present

## 2024-01-17 MED ORDER — AMOXICILLIN 875 MG PO TABS: 875.0000 mg | ORAL_TABLET | Freq: Two times a day (BID) | ORAL | 0 refills | Status: AC

## 2024-01-17 NOTE — Assessment & Plan Note (Signed)
 Successful disimpaction without complications

## 2024-01-17 NOTE — Progress Notes (Signed)
 Subjective:  HPI: Denise Morales is a 63 y.o. female presenting on 01/17/2024 for Otalgia (L ear pain since coming back from the beach. Pt states she feels like her ear opens up in the morning and then gets stuffy again. )   Otalgia    Patient is in today for ear fullness for 4 days. She was at the beach last week. NO viral illnesss or URI. Denies fever, chills, body aches, malaise, drainage, ear pain. The fullness and discomfort is starting to radiate below her ear. Has tried OTC swimmer's ear.   Ear Cerumen Removal  Date/Time: 01/17/2024 11:04 AM  Performed by: Kayla Jeoffrey RAMAN, FNP Authorized by: Kayla Jeoffrey RAMAN, FNP  Location details: left ear Patient tolerance: patient tolerated the procedure well with no immediate complications Procedure type: irrigation  Sedation: Patient sedated: no       Review of Systems  HENT:  Positive for ear pain.   All other systems reviewed and are negative.   Relevant past medical history reviewed and updated as indicated.   Past Medical History:  Diagnosis Date   Allergy    Anemia    Anxiety    Arthritis    Bunion of left foot 07/2023   Depression    Essential hypertension    Fibromyalgia    GERD (gastroesophageal reflux disease)    Heart murmur    History of hiatal hernia    Hyperlipidemia    Hypothyroidism    Iron  deficiency anemia    Melanoma (HCC)    face   Pre-diabetes      Past Surgical History:  Procedure Laterality Date   ABDOMINAL HYSTERECTOMY  1985   AIKEN OSTEOTOMY Right 06/05/2022   Procedure: KATRINA OSTEOTOMY;  Surgeon: Tobie Franky SQUIBB, DPM;  Location: [facility];  Service: Podiatry;  Laterality: Right;   BICEPT TENODESIS Right 12/05/2022   Procedure: RIGHT BICEPS TENODESIS;  Surgeon: Addie Cordella Hamilton, MD;  Location: [facility] OR;  Service: Orthopedics;  Laterality: Right;   CAPSULOTOMY METATARSOPHALANGEAL Right 06/05/2022   Procedure: CAPSULOTOMY METATARSOPHALANGEAL;  Surgeon: Tobie Franky SQUIBB,  DPM;  Location: [facility];  Service: Podiatry;  Laterality: Right;   CHOLECYSTECTOMY     COLONOSCOPY     COLONOSCOPY WITH ESOPHAGOGASTRODUODENOSCOPY (EGD)  12/2022   HALLUX VALGUS LAPIDUS Right 06/05/2022   Procedure: HALLUX VALGUS LAPIDUS;  Surgeon: Tobie Franky SQUIBB, DPM;  Location: [facility];  Service: Podiatry;  Laterality: Right;   HALLUX VALGUS LAPIDUS Left 09/03/2023   Procedure: ROMAYNE LOOK;  Surgeon: Tobie Franky SQUIBB, DPM;  Location: [facility];  Service: Orthopedics/Podiatry;  Laterality: Left;  POPLITEAL BLOCK   HAMMER TOE SURGERY Right 06/05/2022   Procedure: HAMMER TOE CORRECTION;  Surgeon: Tobie Franky SQUIBB, DPM;  Location: [facility];  Service: Podiatry;  Laterality: Right;   JOINT REPLACEMENT  2022 - 2024   REVERSE SHOULDER ARTHROPLASTY Right 12/05/2022   Procedure: RIGHT REVERSE SHOULDER ARTHROPLASTY;  Surgeon: Addie Cordella Hamilton, MD;  Location: [facility] OR;  Service: Orthopedics;  Laterality: Right;   REVERSE SHOULDER ARTHROPLASTY Left 04/16/2023   Procedure: REVERSE SHOULDER ARTHROPLASTY;  Surgeon: Addie Cordella Hamilton, MD;  Location: [facility] OR;  Service: Orthopedics;  Laterality: Left;   SHOULDER ARTHROSCOPY WITH ROTATOR CUFF REPAIR AND SUBACROMIAL DECOMPRESSION Right 08/12/2020   Procedure: RIGHT SHOULDER ARTHROSCOPY WITH ROTATOR CUFF REPAIR AND SUBACROMIAL DECOMPRESSION;  Surgeon: Vernetta Lonni GRADE, MD;  Location: [facility];  Service: Orthopedics;  Laterality: Right;   STERIOD INJECTION Left 09/03/2023  Procedure: STEROID INJECTION;  Surgeon: Tobie Franky SQUIBB, DPM;  Location: [facility];  Service: Orthopedics/Podiatry;  Laterality: Left;   TOTAL KNEE ARTHROPLASTY Right 12/24/2020   Procedure: RIGHT TOTAL KNEE ARTHROPLASTY;  Surgeon: Vernetta Lonni GRADE, MD;  Location: [facility];  Service: Orthopedics;  Laterality: Right;   TOTAL KNEE ARTHROPLASTY Left 07/15/2021   Procedure: LEFT TOTAL KNEE ARTHROPLASTY;  Surgeon:  Vernetta Lonni GRADE, MD;  Location: [facility];  Service: Orthopedics;  Laterality: Left;   TUBAL LIGATION  1987   XI ROBOTIC ASSISTED PARAESOPHAGEAL HERNIA REPAIR N/A 03/12/2023   Procedure: XI ROBOTIC ASSISTED PARAESOPHAGEAL HERNIA REPAIR WITH FUNDOPLICATION;  Surgeon: Signe Mitzie LABOR, MD;  Location: [facility];  Service: General;  Laterality: N/A;  180    Allergies and medications reviewed and updated.   Current Outpatient Medications:    acetaminophen  (TYLENOL ) 500 MG tablet, Take 500-1,000 mg by mouth See admin instructions. Take 2 tablets (1000 mg) by mouth scheduled at bedtime & every 6 hours if needed for pain., Disp: , Rfl:    amoxicillin -clavulanate (AUGMENTIN ) 875-125 MG tablet, Take 1 tablet by mouth 2 (two) times daily., Disp: 20 tablet, Rfl: 0   buPROPion  (WELLBUTRIN  XL) 150 MG 24 hr tablet, Take 1 tablet (150 mg total) by mouth every morning., Disp: 90 tablet, Rfl: 0   busPIRone  (BUSPAR ) 7.5 MG tablet, Take 1 tablet by mouth twice daily, Disp: 60 tablet, Rfl: 0   celecoxib  (CELEBREX ) 100 MG capsule, Take 1 capsule (100 mg total) by mouth 2 (two) times daily., Disp: 180 capsule, Rfl: 1   dicyclomine  (BENTYL ) 10 MG capsule, Take 1 capsule (10 mg total) by mouth 2 (two) times daily as needed (abdominal pain)., Disp: 180 capsule, Rfl: 0   escitalopram  (LEXAPRO ) 5 MG tablet, Take 2 tablets (10 mg total) by mouth daily for 14 days, THEN 1 tablet (5 mg total) daily for 14 days., Disp: 42 tablet, Rfl: 0   gabapentin  (NEURONTIN ) 300 MG capsule, Take 1 capsule (300 mg total) by mouth 3 (three) times daily., Disp: 90 capsule, Rfl: 3   KRILL OIL PO, Take 500 mg by mouth in the morning., Disp: , Rfl:    levothyroxine  (SYNTHROID ) 50 MCG tablet, Take 1 tablet (50 mcg total) by mouth daily., Disp: 90 tablet, Rfl: 3   lisinopril -hydrochlorothiazide  (ZESTORETIC ) 20-12.5 MG tablet, Take 1 tablet by mouth daily., Disp: 90 tablet, Rfl: 3   pravastatin  (PRAVACHOL ) 40 MG tablet, Take 1 tablet by mouth  once daily, Disp: 90 tablet, Rfl: 0   QUEtiapine  (SEROQUEL ) 50 MG tablet, Take 1 tablet (50 mg total) by mouth at bedtime., Disp: 12 tablet, Rfl: 0   TURMERIC PO, Take 1,500 mg by mouth in the morning., Disp: , Rfl:    Vitamin D -Vitamin K (VITAMIN K2 -VITAMIN D3 PO), Take 1 tablet by mouth in the morning. 100 mg/50 mcg, Disp: , Rfl:   Allergies  Allergen Reactions   Hydroxyzine  Other (See Comments)    hallucinations    Objective:   BP 124/82   Pulse 72   Ht 4' 10 (1.473 m)   Wt 181 lb 8 oz (82.3 kg)   SpO2 99%   BMI 37.93 kg/m      01/17/2024   10:50 AM 12/10/2023   10:55 AM 09/03/2023   12:24 PM  Vitals with BMI  Height 4' 10 4' 10   Weight 181 lbs 8 oz 183 lbs 13 oz   BMI 37.94 38.42   Systolic 124 117 872  Diastolic 82 79 77  Pulse 72 77 72     Physical Exam Vitals and nursing note reviewed.  Constitutional:      Appearance: Normal appearance. She is normal weight.  HENT:     Head: Normocephalic and atraumatic.     Right Ear: Tympanic membrane, ear canal and external ear normal.     Left Ear: Ear canal and external ear normal. There is impacted cerumen. Tympanic membrane is erythematous and bulging.  Skin:    General: Skin is warm and dry.  Neurological:     General: No focal deficit present.     Mental Status: She is alert and oriented to person, place, and time. Mental status is at baseline.  Psychiatric:        Mood and Affect: Mood normal.        Behavior: Behavior normal.        Thought Content: Thought content normal.        Judgment: Judgment normal.     Assessment & Plan:  Impacted cerumen of left ear Assessment & Plan: Successful disimpaction without complications.   Acute otitis media, unspecified otitis media type Assessment & Plan: After successful disimpaction TM appears erythematous and bulging with fluid level present. Her symptoms have dissipated in office with disimpaction, advised I will prescribed Amox for persistent symptoms  however encouraged her to only fill if symptoms are not improved or worsen. She is in agreement.    Other orders -     Ear Cerumen Removal     Follow up plan: Return if symptoms worsen or fail to improve.  Jeoffrey GORMAN Barrio, FNP

## 2024-01-17 NOTE — Assessment & Plan Note (Signed)
 After successful disimpaction TM appears erythematous and bulging with fluid level present. Her symptoms have dissipated in office with disimpaction, advised I will prescribed Amox for persistent symptoms however encouraged her to only fill if symptoms are not improved or worsen. She is in agreement.

## 2024-01-18 ENCOUNTER — Ambulatory Visit (INDEPENDENT_AMBULATORY_CARE_PROVIDER_SITE_OTHER): Admitting: Podiatry

## 2024-01-18 ENCOUNTER — Other Ambulatory Visit: Payer: Self-pay | Admitting: Podiatry

## 2024-01-18 ENCOUNTER — Ambulatory Visit (INDEPENDENT_AMBULATORY_CARE_PROVIDER_SITE_OTHER)

## 2024-01-18 DIAGNOSIS — M2012 Hallux valgus (acquired), left foot: Secondary | ICD-10-CM

## 2024-01-18 DIAGNOSIS — M2011 Hallux valgus (acquired), right foot: Secondary | ICD-10-CM

## 2024-01-18 DIAGNOSIS — Z01818 Encounter for other preprocedural examination: Secondary | ICD-10-CM

## 2024-01-18 NOTE — Progress Notes (Signed)
 Subjective:  Patient ID: Denise Morales, female    DOB: July 03, 1960,  MRN: [id number]  Chief Complaint  Patient presents with   Bunions    Pt is concerned that her right toe is turning in again she denies any pain at this time but wanted to have it checked     63 y.o. female presents with the above complaint.  Patient presents with complaint of bilateral hallux valgus deformity.  Patient states that the big toe is rotating and.  She states the bunion part is doing great but she is having some problems with the big toe rotating and she wanted to discuss treatment options for her she is tried spacers to keep it separated but has not been helping is causing her some discomfort she is having pain 5 out of 10.  Would like to discuss treatment options for this.   Review of Systems: Negative except as noted in the HPI. Denies N/V/F/Ch.  Past Medical History:  Diagnosis Date   Allergy    Anemia    Anxiety    Arthritis    Bunion of left foot 07/2023   Depression    Essential hypertension    Fibromyalgia    GERD (gastroesophageal reflux disease)    Heart murmur    History of hiatal hernia    Hyperlipidemia    Hypothyroidism    Iron  deficiency anemia    Melanoma (HCC)    face   Pre-diabetes     Current Outpatient Medications:    acetaminophen  (TYLENOL ) 500 MG tablet, Take 500-1,000 mg by mouth See admin instructions. Take 2 tablets (1000 mg) by mouth scheduled at bedtime & every 6 hours if needed for pain., Disp: , Rfl:    amoxicillin  (AMOXIL ) 875 MG tablet, Take 1 tablet (875 mg total) by mouth 2 (two) times daily for 5 days., Disp: 10 tablet, Rfl: 0   buPROPion  (WELLBUTRIN  XL) 150 MG 24 hr tablet, Take 1 tablet (150 mg total) by mouth every morning., Disp: 90 tablet, Rfl: 0   busPIRone  (BUSPAR ) 7.5 MG tablet, Take 1 tablet by mouth twice daily, Disp: 60 tablet, Rfl: 0   celecoxib  (CELEBREX ) 100 MG capsule, Take 1 capsule (100 mg total) by mouth 2 (two) times daily., Disp: 180 capsule,  Rfl: 1   dicyclomine  (BENTYL ) 10 MG capsule, Take 1 capsule (10 mg total) by mouth 2 (two) times daily as needed (abdominal pain)., Disp: 180 capsule, Rfl: 0   escitalopram  (LEXAPRO ) 5 MG tablet, Take 2 tablets (10 mg total) by mouth daily for 14 days, THEN 1 tablet (5 mg total) daily for 14 days., Disp: 42 tablet, Rfl: 0   gabapentin  (NEURONTIN ) 300 MG capsule, Take 1 capsule (300 mg total) by mouth 3 (three) times daily., Disp: 90 capsule, Rfl: 3   KRILL OIL PO, Take 500 mg by mouth in the morning., Disp: , Rfl:    levothyroxine  (SYNTHROID ) 50 MCG tablet, Take 1 tablet (50 mcg total) by mouth daily., Disp: 90 tablet, Rfl: 3   lisinopril -hydrochlorothiazide  (ZESTORETIC ) 20-12.5 MG tablet, Take 1 tablet by mouth daily., Disp: 90 tablet, Rfl: 3   pravastatin  (PRAVACHOL ) 40 MG tablet, Take 1 tablet by mouth once daily, Disp: 90 tablet, Rfl: 0   QUEtiapine  (SEROQUEL ) 50 MG tablet, Take 1 tablet (50 mg total) by mouth at bedtime., Disp: 12 tablet, Rfl: 0   TURMERIC PO, Take 1,500 mg by mouth in the morning., Disp: , Rfl:    Vitamin D -Vitamin K (VITAMIN K2 -VITAMIN D3 PO), Take 1  tablet by mouth in the morning. 100 mg/50 mcg, Disp: , Rfl:   Social History   Tobacco Use  Smoking Status Never   Passive exposure: Past  Smokeless Tobacco Never    Allergies  Allergen Reactions   Hydroxyzine  Other (See Comments)    hallucinations   Objective:  There were no vitals filed for this visit. There is no height or weight on file to calculate BMI. Constitutional Well developed. Well nourished.  Vascular Dorsalis pedis pulses palpable bilaterally. Posterior tibial pulses palpable bilaterally. Capillary refill normal to all digits.  No cyanosis or clubbing noted. Pedal hair growth normal.  Neurologic Normal speech. Oriented to person, place, and time. Epicritic sensation to light touch grossly present bilaterally.  Dermatologic Nails well groomed and normal in appearance. No open wounds. No skin  lesions.  Orthopedic: Pain on palpation to bilateral hallux.  Hallux valgus deformity noted bilaterally.  No first MPJ pain noted no intra-articular first MPJ pain noted   Radiographs: 3 views of skeletally mature adult bilateral foot: 3 views of sketcher mature the bilateral foot: Hallux valgus deformity noted.  Hardware is intact no signs of backing out or loosening noted.  Consolidation is noted across the tarsometatarsal joint on the right foot.  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 Assessment:   1. Encounter for preoperative examination for general surgical procedure   2. Hallux valgus, right   3. Hallux valgus, left    Plan:  Patient was evaluated and treated and all questions answered.  Bilateral hallux valgus deformity right greater than left - All questions and concerns were discussed with the patient in extensive detail given the amount of pain that she is experiencing she will benefit from osteotomy of the phalangeal to help reduce the hallux valgus deformity her primary complaint is the painful drifting of the toes.  She is not having any hardware pain to the left lower extremity.  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  However it has been less than a year of surgical so we will continue clinically monitoring it. - I discussed my preoperative general postoperative plan with the patient in extensive detail she agrees with the plan like to proceed with surgery. -Informed surgical risk consent was reviewed and read aloud to the patient.  I reviewed the films.  I have discussed my findings with the patient in great detail.  I have discussed all risks including but not limited to infection, stiffness, scarring, limp, disability, deformity, damage to blood vessels and nerves, numbness, poor healing, need for braces, arthritis, chronic pain, amputation, death.  All benefits and realistic expectations discussed in great detail.   I have made no promises as to the outcome.  I have provided realistic expectations.  I have offered the patient a 2nd opinion, which they have declined and assured me they preferred to proceed despite the risks   No follow-ups on file.

## 2024-01-28 ENCOUNTER — Ambulatory Visit (HOSPITAL_COMMUNITY): Admitting: Clinical

## 2024-01-30 ENCOUNTER — Ambulatory Visit
Admission: RE | Admit: 2024-01-30 | Discharge: 2024-01-30 | Disposition: A | Discharge status: Home / Self Care | Source: Ambulatory Visit | Attending: Family Medicine

## 2024-01-30 DIAGNOSIS — Z1231 Encounter for screening mammogram for malignant neoplasm of breast: Secondary | ICD-10-CM

## 2024-01-31 ENCOUNTER — Other Ambulatory Visit: Payer: Self-pay | Admitting: Family Medicine

## 2024-02-06 ENCOUNTER — Encounter: Payer: Self-pay | Admitting: Family Medicine

## 2024-02-08 ENCOUNTER — Other Ambulatory Visit: Payer: Self-pay

## 2024-02-08 DIAGNOSIS — F339 Major depressive disorder, recurrent, unspecified: Secondary | ICD-10-CM

## 2024-02-08 DIAGNOSIS — F419 Anxiety disorder, unspecified: Secondary | ICD-10-CM

## 2024-02-08 MED ORDER — BUPROPION HCL ER (XL) 150 MG PO TB24: 150.0000 mg | ORAL_TABLET | Freq: Two times a day (BID) | ORAL | 2 refills | Status: DC

## 2024-02-08 NOTE — Progress Notes (Signed)
 Medication dose change okay per My Chart message on 02/06/2024, Denise Barrio, NP. Mjp,lpn

## 2024-02-12 ENCOUNTER — Emergency Department (HOSPITAL_BASED_OUTPATIENT_CLINIC_OR_DEPARTMENT_OTHER)
Admission: EM | Admit: 2024-02-12 | Discharge: 2024-02-12 | Disposition: A | Discharge status: Home / Self Care | Source: Ambulatory Visit | Attending: Emergency Medicine | Admitting: Emergency Medicine

## 2024-02-12 ENCOUNTER — Encounter: Payer: Self-pay | Admitting: Family Medicine

## 2024-02-12 ENCOUNTER — Other Ambulatory Visit: Payer: Self-pay

## 2024-02-12 ENCOUNTER — Emergency Department (HOSPITAL_BASED_OUTPATIENT_CLINIC_OR_DEPARTMENT_OTHER)

## 2024-02-12 ENCOUNTER — Ambulatory Visit: Payer: Self-pay

## 2024-02-12 ENCOUNTER — Ambulatory Visit: Admitting: Family Medicine

## 2024-02-12 VITALS — BP 122/80 | HR 80 | Temp 98.2°F | Ht <= 58 in | Wt 177.0 lb

## 2024-02-12 DIAGNOSIS — K5732 Diverticulitis of large intestine without perforation or abscess without bleeding: Secondary | ICD-10-CM | POA: Diagnosis not present

## 2024-02-12 DIAGNOSIS — Z79899 Other long term (current) drug therapy: Secondary | ICD-10-CM | POA: Insufficient documentation

## 2024-02-12 DIAGNOSIS — E039 Hypothyroidism, unspecified: Secondary | ICD-10-CM | POA: Insufficient documentation

## 2024-02-12 DIAGNOSIS — I10 Essential (primary) hypertension: Secondary | ICD-10-CM | POA: Diagnosis not present

## 2024-02-12 DIAGNOSIS — K5792 Diverticulitis of intestine, part unspecified, without perforation or abscess without bleeding: Secondary | ICD-10-CM

## 2024-02-12 DIAGNOSIS — R1032 Left lower quadrant pain: Secondary | ICD-10-CM

## 2024-02-12 LAB — COMPREHENSIVE METABOLIC PANEL WITH GFR
ALT: 29 U/L (ref 0–44)
AST: 46 U/L — ABNORMAL HIGH (ref 15–41)
Albumin: 4.5 g/dL (ref 3.5–5.0)
Alkaline Phosphatase: 131 U/L — ABNORMAL HIGH (ref 38–126)
Anion gap: 15 (ref 5–15)
BUN: 16 mg/dL (ref 8–23)
CO2: 22 mmol/L (ref 22–32)
Calcium: 10.2 mg/dL (ref 8.9–10.3)
Chloride: 104 mmol/L (ref 98–111)
Creatinine, Ser: 1.22 mg/dL — ABNORMAL HIGH (ref 0.44–1.00)
GFR, Estimated: 49 mL/min — ABNORMAL LOW (ref >60)
Glucose, Bld: 112 mg/dL — ABNORMAL HIGH (ref 70–99)
Potassium: 4.7 mmol/L (ref 3.5–5.1)
Sodium: 141 mmol/L (ref 135–145)
Total Bilirubin: 0.6 mg/dL (ref 0.0–1.2)
Total Protein: 7.3 g/dL (ref 6.5–8.1)

## 2024-02-12 LAB — URINALYSIS, ROUTINE W REFLEX MICROSCOPIC
Bacteria, UA: NONE SEEN
Bilirubin Urine: NEGATIVE
Glucose, UA: NEGATIVE mg/dL
Hgb urine dipstick: NEGATIVE
Ketones, ur: NEGATIVE mg/dL
Nitrite: NEGATIVE
Protein, ur: NEGATIVE mg/dL
Specific Gravity, Urine: 1.021 (ref 1.005–1.030)
pH: 6 (ref 5.0–8.0)

## 2024-02-12 LAB — LIPASE, BLOOD: Lipase: 20 U/L (ref 11–51)

## 2024-02-12 LAB — CBC
HCT: 41.5 % (ref 36.0–46.0)
Hemoglobin: 13.9 g/dL (ref 12.0–15.0)
MCH: 29.5 pg (ref 26.0–34.0)
MCHC: 33.5 g/dL (ref 30.0–36.0)
MCV: 88.1 fL (ref 80.0–100.0)
Platelets: 303 K/uL (ref 150–400)
RBC: 4.71 MIL/uL (ref 3.87–5.11)
RDW: 13.5 % (ref 11.5–15.5)
WBC: 9.5 K/uL (ref 4.0–10.5)
nRBC: 0 % (ref 0.0–0.2)

## 2024-02-12 LAB — LACTIC ACID, PLASMA: Lactic Acid, Venous: 1.1 mmol/L (ref 0.5–1.9)

## 2024-02-12 MED ORDER — IOHEXOL 300 MG/ML  SOLN
100.0000 mL | Freq: Once | INTRAMUSCULAR | Status: AC | PRN
  Administered 2024-02-12: 100 mL via INTRAVENOUS

## 2024-02-12 MED ORDER — AMOXICILLIN-POT CLAVULANATE 875-125 MG PO TABS: 1.0000 | ORAL_TABLET | Freq: Two times a day (BID) | ORAL | 0 refills | Status: AC

## 2024-02-12 MED ORDER — ONDANSETRON HCL 4 MG/2ML IJ SOLN
4.0000 mg | Freq: Once | INTRAMUSCULAR | Status: AC
  Administered 2024-02-12: 4 mg via INTRAVENOUS
  Filled 2024-02-12: qty 2

## 2024-02-12 MED ORDER — ONDANSETRON HCL 4 MG PO TABS: 4.0000 mg | ORAL_TABLET | Freq: Three times a day (TID) | ORAL | 0 refills | Status: DC | PRN

## 2024-02-12 MED ORDER — MORPHINE SULFATE (PF) 4 MG/ML IV SOLN
4.0000 mg | Freq: Once | INTRAVENOUS | Status: AC
  Administered 2024-02-12: 4 mg via INTRAVENOUS
  Filled 2024-02-12: qty 1

## 2024-02-12 MED ORDER — HYDROCODONE-ACETAMINOPHEN 5-325 MG PO TABS: 2.0000 | ORAL_TABLET | ORAL | 0 refills | Status: DC | PRN

## 2024-02-12 NOTE — ED Provider Notes (Signed)
  EMERGENCY DEPARTMENT AT [facility] Provider Note   CSN: [id number] Arrival date & time: 02/12/24  9041     Patient presents with: Abdominal Pain   Denise Morales is a 63 y.o. female.   63 year old female with history of HTN, HLD, hypothyroidism, fibromyalgia, diverticulosis, history of hysterectomy, cholecystectomy, paraesophageal hernia repair presenting from PCP office due to 1 day history of left lower quadrant abdominal pain with associated back pain, diarrhea, and nausea.  Pain started while at rest yesterday, sudden onset.  Primarily in her left lower quadrant.  Does radiate a little bit to her left flank.  Denies history of kidney stones. Denies recent fevers, vomiting, bloody stools, dark stools.  Does report episode of diarrhea last night as well as some nausea but no vomiting. Denies dysuria, hematuria, chest pain, shortness of breath Does not smoke or drink alcohol Visited PCP today who recommended that she be seen in the ED    Abdominal Pain Associated symptoms: diarrhea and nausea   Associated symptoms: no chest pain, no dysuria, no fever, no hematuria, no shortness of breath and no vomiting         Prior to Admission medications   Medication Sig Start Date End Date Taking? Authorizing Provider  amoxicillin -clavulanate (AUGMENTIN ) 875-125 MG tablet Take 1 tablet by mouth every 12 (twelve) hours for 10 days. 02/12/24 02/22/24 Yes Romelle Booty, MD  HYDROcodone -acetaminophen  (NORCO/VICODIN) 5-325 MG tablet Take 2 tablets by mouth every 4 (four) hours as needed. 02/12/24  Yes Romelle Booty, MD  ondansetron  (ZOFRAN ) 4 MG tablet Take 1 tablet (4 mg total) by mouth every 8 (eight) hours as needed for nausea or vomiting. 02/12/24  Yes Romelle Booty, MD  acetaminophen  (TYLENOL ) 500 MG tablet Take 500-1,000 mg by mouth See admin instructions. Take 2 tablets (1000 mg) by mouth scheduled at bedtime & every 6 hours if needed for pain.    [provider]  buPROPion  (WELLBUTRIN  XL) 150 MG 24 hr tablet Take 1 tablet (150 mg total) by mouth in the morning and at bedtime. 02/08/24   Kayla Jeoffrey RAMAN, FNP  busPIRone  (BUSPAR ) 7.5 MG tablet Take 1 tablet by mouth twice daily 01/31/24   Kayla Jeoffrey RAMAN, FNP  celecoxib  (CELEBREX ) 100 MG capsule Take 1 capsule (100 mg total) by mouth 2 (two) times daily. 08/09/23   Kayla Jeoffrey RAMAN, FNP  dicyclomine  (BENTYL ) 10 MG capsule Take 1 capsule (10 mg total) by mouth 2 (two) times daily as needed (abdominal pain). Patient not taking: Reported on 02/12/2024 10/29/23 01/27/24  Kayla Jeoffrey RAMAN, FNP  escitalopram  (LEXAPRO ) 5 MG tablet Take 2 tablets (10 mg total) by mouth daily for 14 days, THEN 1 tablet (5 mg total) daily for 14 days. Patient not taking: No sig reported 12/17/23 01/17/24  Kayla Jeoffrey RAMAN, FNP  gabapentin  (NEURONTIN ) 300 MG capsule Take 1 capsule (300 mg total) by mouth 3 (three) times daily. 10/22/23   Tobie Franky SQUIBB, DPM  KRILL OIL PO Take 500 mg by mouth in the morning.    [provider]  levothyroxine  (SYNTHROID ) 50 MCG tablet Take 1 tablet (50 mcg total) by mouth daily. 01/01/23   Kayla Jeoffrey RAMAN, FNP  lisinopril -hydrochlorothiazide  (ZESTORETIC ) 20-12.5 MG tablet Take 1 tablet by mouth daily. 08/09/23   Kayla Jeoffrey RAMAN, FNP  pravastatin  (PRAVACHOL ) 40 MG tablet Take 1 tablet by mouth once daily 12/04/23   Kayla Jeoffrey RAMAN, FNP  QUEtiapine  (SEROQUEL ) 50 MG tablet Take 1 tablet (50 mg total) by mouth at  bedtime. 12/04/23   Kayla Jeoffrey RAMAN, FNP  TURMERIC PO Take 1,500 mg by mouth in the morning.    [provider]  Vitamin D -Vitamin K (VITAMIN K2 -VITAMIN D3 PO) Take 1 tablet by mouth in the morning. 100 mg/50 mcg    [provider]    Allergies: Hydroxyzine     Review of Systems  Constitutional:  Negative for fever.  Respiratory:  Negative for shortness of breath.   Cardiovascular:  Negative for chest pain.  Gastrointestinal:  Positive for abdominal pain, diarrhea and nausea. Negative  for blood in stool and vomiting.  Genitourinary:  Positive for flank pain. Negative for dysuria and hematuria.  Neurological:  Negative for weakness, light-headedness and numbness.    Updated Vital Signs BP (!) 146/72 (BP Location: Right Arm)   Pulse 84   Temp 98.7 F (37.1 C)   Resp 18   SpO2 96%   Physical Exam Constitutional:      General: She is not in acute distress. HENT:     Head: Normocephalic and atraumatic.  Cardiovascular:     Rate and Rhythm: Normal rate and regular rhythm.     Heart sounds: No murmur heard. Pulmonary:     Effort: No respiratory distress.     Breath sounds: Normal breath sounds.  Abdominal:     General: Abdomen is flat. Bowel sounds are normal. There is no distension.     Palpations: Abdomen is soft.     Tenderness: There is abdominal tenderness in the left upper quadrant and left lower quadrant. There is left CVA tenderness. There is no right CVA tenderness, guarding or rebound.  Neurological:     Mental Status: She is alert.     (all labs ordered are listed, but only abnormal results are displayed) Labs Reviewed  COMPREHENSIVE METABOLIC PANEL WITH GFR - Abnormal; Notable for the following components:      Result Value   Glucose, Bld 112 (*)    Creatinine, Ser 1.22 (*)    AST 46 (*)    Alkaline Phosphatase 131 (*)    GFR, Estimated 49 (*)    All other components within normal limits  URINALYSIS, ROUTINE W REFLEX MICROSCOPIC - Abnormal; Notable for the following components:   Leukocytes,Ua SMALL (*)    Non Squamous Epithelial 0-5 (*)    All other components within normal limits  LIPASE, BLOOD  CBC  LACTIC ACID, PLASMA  LACTIC ACID, PLASMA    EKG: None  Radiology: CT ABDOMEN PELVIS W CONTRAST Result Date: 02/12/2024 EXAM: CT ABDOMEN AND PELVIS WITH CONTRAST 02/12/2024 11:59:59 AM TECHNIQUE: CT of the abdomen and pelvis was performed with the administration of intravenous contrast. Multiplanar reformatted images are provided for  review. Automated exposure control, iterative reconstruction, and/or weight based adjustment of the mA/kV was utilized to reduce the radiation dose to as low as reasonably achievable. COMPARISON: CT of the abdomen and pelvis dated 10/09/2022. CLINICAL HISTORY: Abdominal pain, acute, nonlocalized. Triage note: Patient states LLQ pain since yesterday. Also endorses back pain, diarrhea yesterday, and some nausea. FINDINGS: LOWER CHEST: Mild sliding hiatus hernia. LIVER: The liver is unremarkable. GALLBLADDER AND BILE DUCTS: Status post cholecystectomy. There is intrahepatic and extrahepatic biliary ductal dilatation. The common bile duct measures up to approximately 17 mm in diameter. SPLEEN: No acute abnormality. PANCREAS: No acute abnormality. ADRENAL GLANDS: No acute abnormality. KIDNEYS, URETERS AND BLADDER: No stones in the kidneys or ureters. No hydronephrosis. No perinephric or periureteral stranding. Urinary bladder is unremarkable. GI AND BOWEL: Numerous  colonic diverticula present. There is abnormal thickening of the wall of the sigmoid colon and there is stranding of the adjacent fat. There is no bowel obstruction. PERITONEUM AND RETROPERITONEUM: No ascites. No free air. VASCULATURE: Aorta is normal in caliber. LYMPH NODES: No lymphadenopathy. REPRODUCTIVE ORGANS: Status post hysterectomy and bilateral salpingo-oophorectomy. BONES AND SOFT TISSUES: No acute osseous abnormality. No focal soft tissue abnormality. IMPRESSION: 1. Abnormal thickening of the wall of the sigmoid colon with adjacent fat stranding, consistent with diverticulitis. 2. Intrahepatic and extrahepatic biliary ductal dilatation with the common bile duct measuring up to approximately 17 mm in diameter. Electronically signed by: Evalene Coho MD 02/12/2024 12:15 PM EDT RP Workstation: [id number]     Procedures   Medications Ordered in the ED  morphine  (PF) 4 MG/ML injection 4 mg (4 mg Intravenous Given 02/12/24 1132)  ondansetron   (ZOFRAN ) injection 4 mg (4 mg Intravenous Given 02/12/24 1131)  iohexol  (OMNIPAQUE ) 300 MG/ML solution 100 mL (100 mLs Intravenous Contrast Given 02/12/24 1153)                                    Medical Decision Making 63 year old female with history of HTN, HLD, hypothyroidism, fibromyalgia, diverticulosis, history of hysterectomy, cholecystectomy, paraesophageal hernia repair presenting from PCP office due to 1 day history of left lower quadrant abdominal pain with associated back pain, diarrhea, and nausea.  Afebrile, hemodynamically stable.  CBC unremarkable.  CMP with slightly elevated creatinine, elevated alk phos, elevated AST.  UA unremarkable. On exam, does have significant tenderness in LLQ and some in LUQ Differential includes diverticulitis, nephrolithiasis, bowel spasm, PUD, mesenteric ischemia. Reassuringly afebrile, no leukocytosis   Obtain CT Abd/pelvis with contrast for further eval. Check lactate as well Treat with IV morphine  and Zofran   12:34 PM CT with uncomplicated diverticulitis Rx Augmentin  BID x 10d Recommend PCP f/u  CT also shows biliary duct dilation to 17mm. With elevated alk phos I would consider outpatient GI workup for PSC, cholangiocarcinoma, etc although she is s/p cholecystectomy so unclear if this is sequelae of that. Defer to PCP Patient reevaluated, symptoms have improved after morphine  and Zofran .  Stable for discharge with outpatient PCP follow-up.  Augmentin  twice daily x 10 days.  As needed Zofran  and Norco.  Discussed return precautions, reviewed CT results.  Amount and/or Complexity of Data Reviewed Labs: ordered. Radiology: ordered.  Risk Prescription drug management.         Final diagnoses:  Diverticulitis    ED Discharge Orders          Ordered    amoxicillin -clavulanate (AUGMENTIN ) 875-125 MG tablet  Every 12 hours        02/12/24 1242    ondansetron  (ZOFRAN ) 4 MG tablet  Every 8 hours PRN        02/12/24 1242     HYDROcodone -acetaminophen  (NORCO/VICODIN) 5-325 MG tablet  Every 4 hours PRN        02/12/24 1242               Romelle Booty, MD 02/12/24 1244    Tegeler, Lonni PARAS, MD 02/12/24 1455

## 2024-02-12 NOTE — Telephone Encounter (Signed)
 FYI Only or Action Required?: Action required by provider: request for appointment.  Patient was last seen in primary care on 01/17/2024 by Kayla Jeoffrey RAMAN, FNP.  Called Nurse Triage reporting Abdominal Pain.  Symptoms began yesterday.  Interventions attempted: OTC medications: tylenol .  Symptoms are: unchanged.  Triage Disposition: See HCP Within 4 Hours (Or PCP Triage)  Patient/caregiver understands and will follow disposition?: YesCopied from CRM #[id number]. Topic: Clinical - Red Word Triage >> Feb 12, 2024  8:07 AM Essie A wrote: Red Word that prompted transfer to Nurse Triage: Patient is having sharp pains in her stomach and back.  Started yesterday on left side and went to the lower stomach. Reason for Disposition  [1] MILD-MODERATE pain AND [2] constant AND [3] present > 2 hours  Answer Assessment - Initial Assessment Questions 1. LOCATION: Where does it hurt?      Left and lower side of stomach 2. RADIATION: Does the pain shoot anywhere else? (e.g., chest, back)     To back 3. ONSET: When did the pain begin? (e.g., minutes, hours or days ago)      yesterday 4. SUDDEN: Gradual or sudden onset?     sudden 5. PATTERN Does the pain come and go, or is it constant?     Stomach comes and goes 6. SEVERITY: How bad is the pain?  (e.g., Scale 1-10; mild, moderate, or severe)     7 7. RECURRENT SYMPTOM: Have you ever had this type of stomach pain before? If Yes, ask: When was the last time? and What happened that time?      na 8. CAUSE: What do you think is causing the stomach pain? (e.g., gallstones, recent abdominal surgery)     Not sure 9. RELIEVING/AGGRAVATING FACTORS: What makes it better or worse? (e.g., antacids, bending or twisting motion, bowel movement)     denies 10. OTHER SYMPTOMS: Do you have any other symptoms? (e.g., back pain, diarrhea, fever, urination pain, vomiting)       Back pain, frequent urination last night  Protocols used: Abdominal  Pain - Female-A-AH

## 2024-02-12 NOTE — ED Notes (Signed)
 ED Provider at bedside.

## 2024-02-12 NOTE — ED Triage Notes (Signed)
 Patient states LLQ pain since yesterday. Also endorses back pain, diarrhea yesterday, and some nausea.

## 2024-02-12 NOTE — ED Notes (Signed)
 Patient transported to CT

## 2024-02-12 NOTE — Discharge Instructions (Addendum)
 Your CT scan shows that you have diverticulitis.  Please take Augmentin  (antibiotic) twice daily for 10 days.  You can use Zofran  as needed for nausea.  You can use Norco as needed for severe pain.  I do recommend staying very well-hydrated  Please follow-up closely with your primary care provider to review the remainder of your CT results. It does show dilation of your biliary duct near your liver, and your PCP may consider additional workup for this.  Please seek medical attention if you develop worsening symptoms including any chest pain, shortness of breath, worsening pain that does not respond to medication, vomiting or passing out

## 2024-02-12 NOTE — Progress Notes (Signed)
 Denise Morales Office Visit  Assessment & Plan:  Left lower quadrant abdominal pain -     CBC with Differential/Platelet -     Comprehensive metabolic panel with GFR -     Urinalysis, Routine w reflex microscopic -     Urine Culture; Future   Assessment and Plan    Acute left lower quadrant abdominal pain with associated nausea and diarrhea suspicious for diverticulitis High suspicion of diverticulitis due to pain location and history. No fever. Concern for potential complications such as abscess formation. - Refer to ER for further evaluation and management. - Advise CT scan to assess for diverticulitis or other abdominal pathology. - Discuss potential need for antibiotics if diverticulitis is confirmed. - Advise against taking pain medication until diagnosis is confirmed to avoid masking symptoms.  Urinary hesitancy and pressure Symptoms may be related to abdominal pain or a separate issue. - Evaluate urinary symptoms further in ER.  Chronic low back pain Pain may be exacerbated by current abdominal issues. - Monitor back pain in conjunction with abdominal evaluation.     Due to severity of her symptoms advised her to go to the ER for further evaluation.  Denise Morales and daughter agreeable to plan.     No follow-ups on file.   Subjective:    Denise Morales ID: Denise Morales, female    DOB: 25-Sep-1960  Age: 63 y.o. MRN: [id number]  Chief Complaint  Denise Morales presents with   Abdominal Pain    Lower abdominal pain since yesterday 02/11/2024. Denise Morales states the pain started on the Left side of lower abdomin and has now moved to R side and around to back.     Abdominal Pain   Discussed the use of AI scribe software for clinical note transcription with the Denise Morales, who gave verbal consent to proceed.  History of Present Illness        Denise Morales is a 63 year old female with diverticulosis who presents with severe left lower quadrant abdominal pain. states the abdominal pain feels  like contractions.   She has been experiencing severe abdominal pain that began yesterday, initially starting in the left lower quadrant, moving to another area overnight, and then returning to the original location. The pain is described as a 7 out of 10, with the worst pain occurring yesterday. She took two Tylenol  this morning, which has slightly alleviated the pain. Denise Morales having some nausea but no vomiting  She feels nauseated but has not experienced vomiting. Her appetite is poor, and she last ate [place] fries yesterday afternoon, which was followed by an episode of diarrhea lasting about an hour. She typically has a bowel movement once a day or every other day, making the diarrhea unusual for her.  She has a history of diverticulosis and recalls a previous episode of diverticulitis, although she cannot recall the exact timing. A CT scan from April 2024 was performed due to abdominal pain and dehydration, and she recalls possibly being treated with antibiotics at that time. She underwent a colonoscopy in December of last year, which revealed diverticulosis and polyps.  She felt cold yesterday without chills and used a blanket and heating pad for comfort. No recent changes in her medications.  She experiences pressure during urination, feeling like she has to push, but denies burning or pain. Her back has been hurting, particularly in the lower region, and the pain has started to move upwards. Physical Exam ABDOMEN: Tender on palpation. Results RADIOLOGY Abdominal CT: Severe colitis (09/2022)  DIAGNOSTIC Colonoscopy:  Diverticulosis and polyps (05/2023) Assessment & Plan Acute left lower quadrant abdominal pain with associated nausea and diarrhea suspicious for diverticulitis High suspicion of diverticulitis due to pain location and history. No fever. Concern for potential complications such as abscess formation. - Refer to ER for further evaluation and management. - Advise CT scan to  assess for diverticulitis or other abdominal pathology. - Discuss potential need for antibiotics if diverticulitis is confirmed. - Advise against taking pain medication until diagnosis is confirmed to avoid masking symptoms.  Urinary hesitancy and pressure Symptoms may be related to abdominal pain or a separate issue. - Evaluate urinary symptoms further in ER.  Chronic low back pain Pain may be exacerbated by current abdominal issues. - Monitor back pain in conjunction with abdominal evaluation.    The 10-year ASCVD risk score (Arnett DK, et al., 2019) is: 8.2%  Past Medical History:  Diagnosis Date   Allergy    Anemia    Anxiety    Arthritis    Bunion of left foot 07/2023   Depression    Essential hypertension    Fibromyalgia    GERD (gastroesophageal reflux disease)    Heart murmur    History of hiatal hernia    Hyperlipidemia    Hypothyroidism    Iron  deficiency anemia    Melanoma (HCC)    face   Pre-diabetes    Past Surgical History:  Procedure Laterality Date   ABDOMINAL HYSTERECTOMY  1985   AIKEN OSTEOTOMY Right 06/05/2022   Procedure: KATRINA OSTEOTOMY;  Surgeon: Tobie Franky SQUIBB, DPM;  Location: [facility] [place];  Service: Podiatry;  Laterality: Right;   BICEPT TENODESIS Right 12/05/2022   Procedure: RIGHT BICEPS TENODESIS;  Surgeon: Addie Cordella Hamilton, MD;  Location: [facility] OR;  Service: Orthopedics;  Laterality: Right;   CAPSULOTOMY METATARSOPHALANGEAL Right 06/05/2022   Procedure: CAPSULOTOMY METATARSOPHALANGEAL;  Surgeon: Tobie Franky SQUIBB, DPM;  Location: [facility] [place];  Service: Podiatry;  Laterality: Right;   CHOLECYSTECTOMY     COLONOSCOPY     COLONOSCOPY WITH ESOPHAGOGASTRODUODENOSCOPY (EGD)  12/2022   HALLUX VALGUS LAPIDUS Right 06/05/2022   Procedure: HALLUX VALGUS LAPIDUS;  Surgeon: Tobie Franky SQUIBB, DPM;  Location: [facility];  Service: Podiatry;  Laterality: Right;   HALLUX VALGUS LAPIDUS Left 09/03/2023   Procedure:  ROMAYNE LOOK;  Surgeon: Tobie Franky SQUIBB, DPM;  Location: [facility];  Service: Orthopedics/Podiatry;  Laterality: Left;  POPLITEAL BLOCK   HAMMER TOE SURGERY Right 06/05/2022   Procedure: HAMMER TOE CORRECTION;  Surgeon: Tobie Franky SQUIBB, DPM;  Location: [facility];  Service: Podiatry;  Laterality: Right;   JOINT REPLACEMENT  2022 - 2024   REVERSE SHOULDER ARTHROPLASTY Right 12/05/2022   Procedure: RIGHT REVERSE SHOULDER ARTHROPLASTY;  Surgeon: Addie Cordella Hamilton, MD;  Location: [facility] OR;  Service: Orthopedics;  Laterality: Right;   REVERSE SHOULDER ARTHROPLASTY Left 04/16/2023   Procedure: REVERSE SHOULDER ARTHROPLASTY;  Surgeon: Addie Cordella Hamilton, MD;  Location: [facility] OR;  Service: Orthopedics;  Laterality: Left;   SHOULDER ARTHROSCOPY WITH ROTATOR CUFF REPAIR AND SUBACROMIAL DECOMPRESSION Right 08/12/2020   Procedure: RIGHT SHOULDER ARTHROSCOPY WITH ROTATOR CUFF REPAIR AND SUBACROMIAL DECOMPRESSION;  Surgeon: Vernetta Lonni GRADE, MD;  Location: [facility];  Service: Orthopedics;  Laterality: Right;   STERIOD INJECTION Left 09/03/2023   Procedure: STEROID INJECTION;  Surgeon: Tobie Franky SQUIBB, DPM;  Location: [facility];  Service: Orthopedics/Podiatry;  Laterality: Left;   TOTAL KNEE ARTHROPLASTY Right 12/24/2020   Procedure: RIGHT TOTAL KNEE ARTHROPLASTY;  Surgeon: Vernetta Lonni GRADE, MD;  Location: [facility];  Service: Orthopedics;  Laterality: Right;   TOTAL KNEE ARTHROPLASTY Left 07/15/2021   Procedure: LEFT TOTAL KNEE ARTHROPLASTY;  Surgeon: Vernetta Lonni GRADE, MD;  Location: [facility];  Service: Orthopedics;  Laterality: Left;   TUBAL LIGATION  1987   XI ROBOTIC ASSISTED PARAESOPHAGEAL HERNIA REPAIR N/A 03/12/2023   Procedure: XI ROBOTIC ASSISTED PARAESOPHAGEAL HERNIA REPAIR WITH FUNDOPLICATION;  Surgeon: Signe Mitzie LABOR, MD;  Location: [facility];  Service: General;  Laterality: N/A;  180   Social History   Tobacco Use   Smoking status: Never     Passive exposure: Past   Smokeless tobacco: Never  Vaping Use   Vaping status: Never Used  Substance Use Topics   Alcohol use: Not Currently   Drug use: Never   Family History  Problem Relation Age of Onset   Dementia Mother    Heart Problems Mother    Stroke Mother    Hypertension Mother    Anxiety disorder Mother    Arthritis Mother    Depression Mother    Hypertension Father    Colon polyps Father    Diabetes Father    Cancer Father    Heart disease Father    Kidney disease Father    Hypertension Sister    Diabetes Sister        borderline   Fibromyalgia Sister    Anxiety disorder Sister    Arthritis Sister    COPD Sister    Depression Sister    Heart disease Sister    Kidney disease Sister    Hypertension Sister    Colon polyps Sister    Diabetes Sister    Fibromyalgia Sister    Heart Problems Sister    Arthritis Sister    Anxiety disorder Sister    COPD Sister    Depression Sister    Heart disease Sister    Hypertension Sister    Heart Problems Sister    Anxiety disorder Sister    Healthy Daughter    Diabetes Paternal Aunt    Colon cancer Neg Hx    Esophageal cancer Neg Hx    Rectal cancer Neg Hx    Stomach cancer Neg Hx    Breast cancer Neg Hx    Allergies  Allergen Reactions   Hydroxyzine  Other (See Comments)    hallucinations    Review of Systems  Gastrointestinal:  Positive for abdominal pain.      Objective:    BP 122/80   Pulse 80   Temp 98.2 F (36.8 C)   Ht 4' 10 (1.473 m)   Wt 177 lb (80.3 kg)   SpO2 98%   BMI 36.99 kg/m  BP Readings from Last 3 Encounters:  02/12/24 (!) 146/72  02/12/24 122/80  01/17/24 124/82   Wt Readings from Last 3 Encounters:  02/12/24 177 lb (80.3 kg)  01/17/24 181 lb 8 oz (82.3 kg)  12/10/23 183 lb 12.8 oz (83.4 kg)    Physical Exam Vitals and nursing note reviewed.  Constitutional:      General: She is not in acute distress.    Appearance: Normal appearance.     Comments: Comes in with  her daughter  HENT:     Head: Normocephalic.     Right Ear: Tympanic membrane, ear canal and external ear normal.     Left Ear: Tympanic membrane, ear canal and external ear normal.  Eyes:     Extraocular Movements: Extraocular movements intact.  Pupils: Pupils are equal, round, and reactive to light.  Cardiovascular:     Rate and Rhythm: Normal rate and regular rhythm.     Heart sounds: Normal heart sounds.  Pulmonary:     Effort: Pulmonary effort is normal.     Breath sounds: Normal breath sounds.  Abdominal:     Tenderness: There is abdominal tenderness in the left lower quadrant. There is guarding. There is no right CVA tenderness.  Musculoskeletal:     Thoracic back: Tenderness present.     Right lower leg: No edema.     Left lower leg: No edema.  Neurological:     General: No focal deficit present.     Mental Status: She is alert and oriented to person, place, and time.  Psychiatric:        Mood and Affect: Mood normal.        Thought Content: Thought content normal.        Judgment: Judgment normal.      Results for orders placed or performed during the hospital encounter of 02/12/24  Lipase, blood  Result Value Ref Range   Lipase 20 11 - 51 U/L  Comprehensive metabolic panel  Result Value Ref Range   Sodium 141 135 - 145 mmol/L   Potassium 4.7 3.5 - 5.1 mmol/L   Chloride 104 98 - 111 mmol/L   CO2 22 22 - 32 mmol/L   Glucose, Bld 112 (H) 70 - 99 mg/dL   BUN 16 8 - 23 mg/dL   Creatinine, Ser 8.77 (H) 0.44 - 1.00 mg/dL   Calcium 89.7 8.9 - 89.6 mg/dL   Total Protein 7.3 6.5 - 8.1 g/dL   Albumin  4.5 3.5 - 5.0 g/dL   AST 46 (H) 15 - 41 U/L   ALT 29 0 - 44 U/L   Alkaline Phosphatase 131 (H) 38 - 126 U/L   Total Bilirubin 0.6 0.0 - 1.2 mg/dL   GFR, Estimated 49 (L) >60 mL/min   Anion gap 15 5 - 15  CBC  Result Value Ref Range   WBC 9.5 4.0 - 10.5 K/uL   RBC 4.71 3.87 - 5.11 MIL/uL   Hemoglobin 13.9 12.0 - 15.0 g/dL   HCT 58.4 63.9 - 53.9 %   MCV 88.1  80.0 - 100.0 fL   MCH 29.5 26.0 - 34.0 pg   MCHC 33.5 30.0 - 36.0 g/dL   RDW 86.4 88.4 - 84.4 %   Platelets 303 150 - 400 K/uL   nRBC 0.0 0.0 - 0.2 %  Urinalysis, Routine w reflex microscopic -Urine, Clean Catch  Result Value Ref Range   Color, Urine YELLOW YELLOW   APPearance CLEAR CLEAR   Specific Gravity, Urine 1.021 1.005 - 1.030   pH 6.0 5.0 - 8.0   Glucose, UA NEGATIVE NEGATIVE mg/dL   Hgb urine dipstick NEGATIVE NEGATIVE   Bilirubin Urine NEGATIVE NEGATIVE   Ketones, ur NEGATIVE NEGATIVE mg/dL   Protein, ur NEGATIVE NEGATIVE mg/dL   Nitrite NEGATIVE NEGATIVE   Leukocytes,Ua SMALL (A) NEGATIVE   RBC / HPF 0-5 0 - 5 RBC/hpf   WBC, UA 11-20 0 - 5 WBC/hpf   Bacteria, UA NONE SEEN NONE SEEN   Squamous Epithelial / HPF 0-5 0 - 5 /HPF   Mucus PRESENT    Non Squamous Epithelial 0-5 (A) NONE SEEN

## 2024-02-13 ENCOUNTER — Other Ambulatory Visit: Payer: Self-pay | Admitting: Family Medicine

## 2024-02-13 ENCOUNTER — Encounter: Payer: Self-pay | Admitting: Family Medicine

## 2024-02-13 DIAGNOSIS — K838 Other specified diseases of biliary tract: Secondary | ICD-10-CM

## 2024-02-13 NOTE — Progress Notes (Signed)
 Referral to GI due to CT scan results 8/19 17mm common bile duct dilation. History of cholecystectomy.

## 2024-02-15 ENCOUNTER — Other Ambulatory Visit: Payer: Self-pay

## 2024-02-15 ENCOUNTER — Other Ambulatory Visit: Payer: Self-pay | Admitting: Family Medicine

## 2024-02-15 DIAGNOSIS — K838 Other specified diseases of biliary tract: Secondary | ICD-10-CM

## 2024-02-15 DIAGNOSIS — R1032 Left lower quadrant pain: Secondary | ICD-10-CM

## 2024-02-20 ENCOUNTER — Encounter: Payer: Self-pay | Admitting: Family Medicine

## 2024-02-20 ENCOUNTER — Ambulatory Visit: Admitting: Family Medicine

## 2024-02-20 VITALS — BP 132/83 | HR 83 | Temp 97.5°F | Ht <= 58 in | Wt 173.0 lb

## 2024-02-20 DIAGNOSIS — K5792 Diverticulitis of intestine, part unspecified, without perforation or abscess without bleeding: Secondary | ICD-10-CM | POA: Diagnosis not present

## 2024-02-20 LAB — COMPREHENSIVE METABOLIC PANEL WITH GFR
AG Ratio: 1.6 (calc) (ref 1.0–2.5)
ALT: 17 U/L (ref 6–29)
AST: 12 U/L (ref 10–35)
Albumin: 4.4 g/dL (ref 3.6–5.1)
Alkaline phosphatase (APISO): 121 U/L (ref 37–153)
BUN/Creatinine Ratio: 13 (calc) (ref 6–22)
BUN: 20 mg/dL (ref 7–25)
CO2: 26 mmol/L (ref 20–32)
Calcium: 9.8 mg/dL (ref 8.6–10.4)
Chloride: 98 mmol/L (ref 98–110)
Creat: 1.5 mg/dL — ABNORMAL HIGH (ref 0.50–1.05)
Globulin: 2.7 g/dL (ref 1.9–3.7)
Glucose, Bld: 105 mg/dL — ABNORMAL HIGH (ref 65–99)
Potassium: 4.1 mmol/L (ref 3.5–5.3)
Sodium: 134 mmol/L — ABNORMAL LOW (ref 135–146)
Total Bilirubin: 0.5 mg/dL (ref 0.2–1.2)
Total Protein: 7.1 g/dL (ref 6.1–8.1)
eGFR: 39 mL/min/1.73m2 — ABNORMAL LOW (ref >=60)

## 2024-02-20 LAB — CBC WITH DIFFERENTIAL/PLATELET
Absolute Lymphocytes: 2231 {cells}/uL (ref 850–3900)
Absolute Monocytes: 799 {cells}/uL (ref 200–950)
Basophils Absolute: 67 {cells}/uL (ref 0–200)
Basophils Relative: 0.6 %
Eosinophils Absolute: 200 {cells}/uL (ref 15–500)
Eosinophils Relative: 1.8 %
HCT: 41.3 % (ref 35.0–45.0)
Hemoglobin: 13.7 g/dL (ref 11.7–15.5)
MCH: 29.8 pg (ref 27.0–33.0)
MCHC: 33.2 g/dL (ref 32.0–36.0)
MCV: 90 fL (ref 80.0–100.0)
MPV: 10.6 fL (ref 7.5–12.5)
Monocytes Relative: 7.2 %
Neutro Abs: 7803 {cells}/uL — ABNORMAL HIGH (ref 1500–7800)
Neutrophils Relative %: 70.3 %
Platelets: 304 Thousand/uL (ref 140–400)
RBC: 4.59 Million/uL (ref 3.80–5.10)
RDW: 13.3 % (ref 11.0–15.0)
Total Lymphocyte: 20.1 %
WBC: 11.1 Thousand/uL — ABNORMAL HIGH (ref 3.8–10.8)

## 2024-02-20 NOTE — Progress Notes (Addendum)
 Subjective:  HPI: Denise Morales is a 63 y.o. female presenting on 02/20/2024 for Hospitalization Follow-up (02/11/2024 Diverticulitis: pt. Reports still having discomfort when eating . She is only comfortable drinking a milkshake. When she does eat she gets severely nauseated.)   HPI Patient is in today for hospital follow up after being seen at [facility] ED for diverticulitis on 02/12/2024. Was evaluated for LLQ pain, back pain, diarrhea, and nausea for 1-2 days. CT showed sigmoid diverticulitis for which she was treated with Augmentin  BID x10d, zofran , norco. Other CT findings of significance include 17mm common bile duct dilation. I have already placed a referral to GI for this.  She reports today her abdominal pain is improved. Denies fever, chills, diarrhea, constipation, vomiting. Has 2 days left of Augmentin . Has had a decreased appetite and is staying hydrated but has minimal PO intake.   Is having generalized body aches and mild nausea, has PMH of fibromyalgia and feels this is similar.   Review of Systems  All other systems reviewed and are negative.   Relevant past medical history reviewed and updated as indicated.   Past Medical History:  Diagnosis Date   Allergy    Anemia    Anxiety    Arthritis    Bunion of left foot 07/2023   Depression    Essential hypertension    Fibromyalgia    GERD (gastroesophageal reflux disease)    Heart murmur    History of hiatal hernia    Hyperlipidemia    Hypothyroidism    Iron  deficiency anemia    Melanoma (HCC)    face   Pre-diabetes      Past Surgical History:  Procedure Laterality Date   ABDOMINAL HYSTERECTOMY  1985   AIKEN OSTEOTOMY Right 06/05/2022   Procedure: KATRINA OSTEOTOMY;  Surgeon: Tobie Franky SQUIBB, DPM;  Location: [facility] [place];  Service: Podiatry;  Laterality: Right;   BICEPT TENODESIS Right 12/05/2022   Procedure: RIGHT BICEPS TENODESIS;  Surgeon: Addie Cordella Hamilton, MD;  Location: [facility]  OR;  Service: Orthopedics;  Laterality: Right;   CAPSULOTOMY METATARSOPHALANGEAL Right 06/05/2022   Procedure: CAPSULOTOMY METATARSOPHALANGEAL;  Surgeon: Tobie Franky SQUIBB, DPM;  Location: [facility] [place];  Service: Podiatry;  Laterality: Right;   CHOLECYSTECTOMY     COLONOSCOPY     COLONOSCOPY WITH ESOPHAGOGASTRODUODENOSCOPY (EGD)  12/2022   HALLUX VALGUS LAPIDUS Right 06/05/2022   Procedure: HALLUX VALGUS LAPIDUS;  Surgeon: Tobie Franky SQUIBB, DPM;  Location: [facility];  Service: Podiatry;  Laterality: Right;   HALLUX VALGUS LAPIDUS Left 09/03/2023   Procedure: ROMAYNE LOOK;  Surgeon: Tobie Franky SQUIBB, DPM;  Location: [facility];  Service: Orthopedics/Podiatry;  Laterality: Left;  POPLITEAL BLOCK   HAMMER TOE SURGERY Right 06/05/2022   Procedure: HAMMER TOE CORRECTION;  Surgeon: Tobie Franky SQUIBB, DPM;  Location: [facility] ;  Service: Podiatry;  Laterality: Right;   HERNIA REPAIR  2025   JOINT REPLACEMENT  2022 - 2024   REVERSE SHOULDER ARTHROPLASTY Right 12/05/2022   Procedure: RIGHT REVERSE SHOULDER ARTHROPLASTY;  Surgeon: Addie Cordella Hamilton, MD;  Location: [facility] OR;  Service: Orthopedics;  Laterality: Right;   REVERSE SHOULDER ARTHROPLASTY Left 04/16/2023   Procedure: REVERSE SHOULDER ARTHROPLASTY;  Surgeon: Addie Cordella Hamilton, MD;  Location: [facility] OR;  Service: Orthopedics;  Laterality: Left;   SHOULDER ARTHROSCOPY WITH ROTATOR CUFF REPAIR AND SUBACROMIAL DECOMPRESSION Right 08/12/2020   Procedure: RIGHT SHOULDER ARTHROSCOPY WITH ROTATOR CUFF REPAIR AND SUBACROMIAL DECOMPRESSION;  Surgeon: Vernetta Lonni GRADE,  MD;  Location: [facility];  Service: Orthopedics;  Laterality: Right;   STERIOD INJECTION Left 09/03/2023   Procedure: STEROID INJECTION;  Surgeon: Tobie Franky SQUIBB, DPM;  Location: [facility];  Service: Orthopedics/Podiatry;  Laterality: Left;   TOTAL KNEE ARTHROPLASTY Right 12/24/2020   Procedure: RIGHT TOTAL KNEE ARTHROPLASTY;   Surgeon: Vernetta Lonni GRADE, MD;  Location: [facility];  Service: Orthopedics;  Laterality: Right;   TOTAL KNEE ARTHROPLASTY Left 07/15/2021   Procedure: LEFT TOTAL KNEE ARTHROPLASTY;  Surgeon: Vernetta Lonni GRADE, MD;  Location: [facility];  Service: Orthopedics;  Laterality: Left;   TUBAL LIGATION  1987   XI ROBOTIC ASSISTED PARAESOPHAGEAL HERNIA REPAIR N/A 03/12/2023   Procedure: XI ROBOTIC ASSISTED PARAESOPHAGEAL HERNIA REPAIR WITH FUNDOPLICATION;  Surgeon: Signe Mitzie LABOR, MD;  Location: [facility];  Service: General;  Laterality: N/A;  180    Allergies and medications reviewed and updated.   Current Outpatient Medications:    acetaminophen  (TYLENOL ) 500 MG tablet, Take 500-1,000 mg by mouth See admin instructions. Take 2 tablets (1000 mg) by mouth scheduled at bedtime & every 6 hours if needed for pain., Disp: , Rfl:    amoxicillin -clavulanate (AUGMENTIN ) 875-125 MG tablet, Take 1 tablet by mouth every 12 (twelve) hours for 10 days., Disp: 20 tablet, Rfl: 0   buPROPion  (WELLBUTRIN  XL) 150 MG 24 hr tablet, Take 1 tablet (150 mg total) by mouth in the morning and at bedtime., Disp: 60 tablet, Rfl: 2   busPIRone  (BUSPAR ) 7.5 MG tablet, Take 1 tablet by mouth twice daily, Disp: 60 tablet, Rfl: 0   celecoxib  (CELEBREX ) 100 MG capsule, Take 1 capsule (100 mg total) by mouth 2 (two) times daily., Disp: 180 capsule, Rfl: 1   dicyclomine  (BENTYL ) 10 MG capsule, TAKE 1 CAPSULE BY MOUTH TWICE DAILY AS NEEDED FOR  ABDOMINAL  PAIN, Disp: 180 capsule, Rfl: 0   gabapentin  (NEURONTIN ) 300 MG capsule, Take 1 capsule (300 mg total) by mouth 3 (three) times daily., Disp: 90 capsule, Rfl: 3   HYDROcodone -acetaminophen  (NORCO/VICODIN) 5-325 MG tablet, Take 2 tablets by mouth every 4 (four) hours as needed., Disp: 10 tablet, Rfl: 0   KRILL OIL PO, Take 500 mg by mouth in the morning., Disp: , Rfl:    levothyroxine  (SYNTHROID ) 50 MCG tablet, Take 1 tablet (50 mcg total) by mouth daily., Disp: 90 tablet, Rfl: 3    lisinopril -hydrochlorothiazide  (ZESTORETIC ) 20-12.5 MG tablet, Take 1 tablet by mouth daily., Disp: 90 tablet, Rfl: 3   ondansetron  (ZOFRAN ) 4 MG tablet, Take 1 tablet (4 mg total) by mouth every 8 (eight) hours as needed for nausea or vomiting., Disp: 20 tablet, Rfl: 0   pravastatin  (PRAVACHOL ) 40 MG tablet, Take 1 tablet by mouth once daily, Disp: 90 tablet, Rfl: 0   TURMERIC PO, Take 1,500 mg by mouth in the morning., Disp: , Rfl:    Vitamin D -Vitamin K (VITAMIN K2 -VITAMIN D3 PO), Take 1 tablet by mouth in the morning. 100 mg/50 mcg, Disp: , Rfl:    escitalopram  (LEXAPRO ) 5 MG tablet, Take 2 tablets (10 mg total) by mouth daily for 14 days, THEN 1 tablet (5 mg total) daily for 14 days. (Patient not taking: No sig reported), Disp: 42 tablet, Rfl: 0   QUEtiapine  (SEROQUEL ) 50 MG tablet, Take 1 tablet (50 mg total) by mouth at bedtime. (Patient not taking: Reported on 02/20/2024), Disp: 12 tablet, Rfl: 0  Allergies  Allergen Reactions   Hydroxyzine  Other (See Comments)    hallucinations    Objective:  BP 132/83   Pulse 83   Temp (!) 97.5 F (36.4 C)   Ht 4' 10 (1.473 m)   Wt 173 lb (78.5 kg)   SpO2 95%   BMI 36.16 kg/m      02/20/2024   10:10 AM 02/12/2024   12:48 PM 02/12/2024   10:05 AM  Vitals with BMI  Height 4' 10    Weight 173 lbs    BMI 36.17    Systolic 132 120 853  Diastolic 83 86 72  Pulse 83 79 84     Physical Exam Vitals and nursing note reviewed.  Constitutional:      Appearance: Normal appearance. She is normal weight.  HENT:     Head: Normocephalic and atraumatic.  Abdominal:     General: Bowel sounds are normal.     Palpations: Abdomen is soft.     Tenderness: There is abdominal tenderness in the left lower quadrant.  Skin:    General: Skin is warm and dry.  Neurological:     General: No focal deficit present.     Mental Status: She is alert and oriented to person, place, and time. Mental status is at baseline.  Psychiatric:        Mood and  Affect: Mood normal.        Behavior: Behavior normal.        Thought Content: Thought content normal.        Judgment: Judgment normal.     Assessment & Plan:  Diverticulitis Assessment & Plan: Pt continues to have some LLQ abdominal tenderness to palpation. Encouraged adequate PO intake and electrolyte fluids. Continue course of antibiotics as prescribed. CMP and CBC today. Strict ER precautions should symptoms persist or worsen. Call GI to schedule for evaluation of common bile duct dilation  Orders: -     CBC with Differential/Platelet -     Comprehensive metabolic panel with GFR     Follow up plan: Return if symptoms worsen or fail to improve.  Jeoffrey GORMAN Barrio, FNP

## 2024-02-20 NOTE — Assessment & Plan Note (Addendum)
 Pt continues to have some LLQ abdominal tenderness to palpation. Encouraged adequate PO intake and electrolyte fluids. Continue course of antibiotics as prescribed. CMP and CBC today. Strict ER precautions should symptoms persist or worsen. Call GI to schedule for evaluation of common bile duct dilation

## 2024-02-21 ENCOUNTER — Encounter: Payer: Self-pay | Admitting: Family Medicine

## 2024-02-21 NOTE — Telephone Encounter (Signed)
 Contacted pt. By phone for Triage she stated that she was able to drink one 32 oz bottle of Pedialyte and keep down some oatmeal and tylenol   this morning . Her body was still sore and she still felt weak. MA instructed pt. To visit ED for evaluation incase she had a blockage. Pt. State she wanted to wait and try to drink her last bottle of 32 oz Pedialyte and try to eat one more meal and take one more dose of tylenol  today and if she was not feeling any better tomorrow she would go to the ED  to be evaluated.

## 2024-02-22 ENCOUNTER — Other Ambulatory Visit: Payer: Self-pay

## 2024-02-22 ENCOUNTER — Encounter (HOSPITAL_BASED_OUTPATIENT_CLINIC_OR_DEPARTMENT_OTHER): Payer: Self-pay | Admitting: Emergency Medicine

## 2024-02-22 ENCOUNTER — Emergency Department (HOSPITAL_BASED_OUTPATIENT_CLINIC_OR_DEPARTMENT_OTHER)
Admission: EM | Admit: 2024-02-22 | Discharge: 2024-02-22 | Disposition: A | Discharge status: Home / Self Care | Source: Ambulatory Visit | Attending: Emergency Medicine | Admitting: Emergency Medicine

## 2024-02-22 DIAGNOSIS — I10 Essential (primary) hypertension: Secondary | ICD-10-CM | POA: Diagnosis not present

## 2024-02-22 DIAGNOSIS — E860 Dehydration: Secondary | ICD-10-CM | POA: Insufficient documentation

## 2024-02-22 DIAGNOSIS — Z79899 Other long term (current) drug therapy: Secondary | ICD-10-CM | POA: Diagnosis not present

## 2024-02-22 DIAGNOSIS — R748 Abnormal levels of other serum enzymes: Secondary | ICD-10-CM | POA: Insufficient documentation

## 2024-02-22 HISTORY — DX: Diverticulitis of intestine, part unspecified, without perforation or abscess without bleeding: K57.92

## 2024-02-22 LAB — CBC WITH DIFFERENTIAL/PLATELET
Abs Immature Granulocytes: 0.04 K/uL (ref 0.00–0.07)
Basophils Absolute: 0 K/uL (ref 0.0–0.1)
Basophils Relative: 0 %
Eosinophils Absolute: 0.3 K/uL (ref 0.0–0.5)
Eosinophils Relative: 3 %
HCT: 38.6 % (ref 36.0–46.0)
Hemoglobin: 12.9 g/dL (ref 12.0–15.0)
Immature Granulocytes: 0 %
Lymphocytes Relative: 17 %
Lymphs Abs: 1.6 K/uL (ref 0.7–4.0)
MCH: 29.5 pg (ref 26.0–34.0)
MCHC: 33.4 g/dL (ref 30.0–36.0)
MCV: 88.1 fL (ref 80.0–100.0)
Monocytes Absolute: 0.8 K/uL (ref 0.1–1.0)
Monocytes Relative: 8 %
Neutro Abs: 7 K/uL (ref 1.7–7.7)
Neutrophils Relative %: 72 %
Platelets: 296 K/uL (ref 150–400)
RBC: 4.38 MIL/uL (ref 3.87–5.11)
RDW: 13.3 % (ref 11.5–15.5)
WBC: 9.7 K/uL (ref 4.0–10.5)
nRBC: 0 % (ref 0.0–0.2)

## 2024-02-22 LAB — COMPREHENSIVE METABOLIC PANEL WITH GFR
ALT: 14 U/L (ref 0–44)
AST: 12 U/L — ABNORMAL LOW (ref 15–41)
Albumin: 4.2 g/dL (ref 3.5–5.0)
Alkaline Phosphatase: 130 U/L — ABNORMAL HIGH (ref 38–126)
Anion gap: 14 (ref 5–15)
BUN: 17 mg/dL (ref 8–23)
CO2: 24 mmol/L (ref 22–32)
Calcium: 10 mg/dL (ref 8.9–10.3)
Chloride: 101 mmol/L (ref 98–111)
Creatinine, Ser: 1.22 mg/dL — ABNORMAL HIGH (ref 0.44–1.00)
GFR, Estimated: 49 mL/min — ABNORMAL LOW (ref >60)
Glucose, Bld: 108 mg/dL — ABNORMAL HIGH (ref 70–99)
Potassium: 3.9 mmol/L (ref 3.5–5.1)
Sodium: 139 mmol/L (ref 135–145)
Total Bilirubin: 0.4 mg/dL (ref 0.0–1.2)
Total Protein: 7.3 g/dL (ref 6.5–8.1)

## 2024-02-22 LAB — LIPASE, BLOOD: Lipase: 22 U/L (ref 11–51)

## 2024-02-22 LAB — URINALYSIS, ROUTINE W REFLEX MICROSCOPIC
Bilirubin Urine: NEGATIVE
Glucose, UA: NEGATIVE mg/dL
Hgb urine dipstick: NEGATIVE
Ketones, ur: NEGATIVE mg/dL
Nitrite: NEGATIVE
Protein, ur: NEGATIVE mg/dL
Specific Gravity, Urine: 1.018 (ref 1.005–1.030)
pH: 6 (ref 5.0–8.0)

## 2024-02-22 LAB — RESP PANEL BY RT-PCR (RSV, FLU A&B, COVID)  RVPGX2
Influenza A by PCR: NEGATIVE
Influenza B by PCR: NEGATIVE
Resp Syncytial Virus by PCR: NEGATIVE
SARS Coronavirus 2 by RT PCR: NEGATIVE

## 2024-02-22 MED ORDER — PROCHLORPERAZINE EDISYLATE 10 MG/2ML IJ SOLN
10.0000 mg | Freq: Once | INTRAMUSCULAR | Status: AC
  Administered 2024-02-22: 10 mg via INTRAVENOUS
  Filled 2024-02-22: qty 2

## 2024-02-22 MED ORDER — ACETAMINOPHEN 500 MG PO TABS
1000.0000 mg | ORAL_TABLET | Freq: Once | ORAL | Status: AC
  Administered 2024-02-22: 1000 mg via ORAL
  Filled 2024-02-22: qty 2

## 2024-02-22 MED ORDER — LACTATED RINGERS IV BOLUS
1000.0000 mL | Freq: Once | INTRAVENOUS | Status: AC
  Administered 2024-02-22: 1000 mL via INTRAVENOUS

## 2024-02-22 NOTE — Discharge Instructions (Signed)
 Your labs today did show an elevated creatinine which is your kidney function.  This likely indicates you are slightly dehydrated.  You were given fluids through your IV here today.  Please continue to increase water  intake at home as well as electrolyte solution such as Pedialyte.  You may discontinue the Pedialyte once your diarrhea has resolved.  Your electrolytes were normal today.  Your liver function tests were normal today.  Your pancreas enzyme was normal today.  Your blood counts are normal today.  Your COVID, flu, RSV test was negative today.  Is follow-up with your PCP if you have any further concerns.  Otherwise, follow-up with GI within the next 6-8 weeks for colonoscopy and further evaluation of your dilated bile duct seen on CT.  Return to the ER for any worsening abdominal pain, persistent vomiting, fevers, any other new or concerning symptoms

## 2024-02-22 NOTE — ED Triage Notes (Signed)
 Pt caox4 c/o weakness and dehydration x1-2 wks stating she has had minimal PO intake since being tx for diverticulitis 8/19, still having diarrhea.

## 2024-02-22 NOTE — ED Provider Notes (Signed)
 [place] EMERGENCY DEPARTMENT AT [facility] Provider Note   CSN: [id number] Arrival date & time: 02/22/24  9147     Patient presents with: Dehydration   Denise Morales is a 63 y.o. female with history of fibromyalgia, hypertension, presents with concern for possible dehydration.  She states she was diagnosed with diverticulitis on 02/12/2024.  Since then, she has been having decreased appetite and only eating some fruit, smoothies, or light meals.  She has been trying to keep hydrated with Pedialyte, but feels she may not be drinking as much as she should be.  She does report about 2-3 episodes of nonbloody diarrhea daily.  She denies any fever or chills.  Reports resolution of her abdominal pain.  She has 1 more dose of her Augmentin  to take today.  She does report some bodyaches that have been ongoing for the past couple days as well as frontal headache this morning.  Denies any cough or sore throat.   HPI     Prior to Admission medications   Medication Sig Start Date End Date Taking? Authorizing Provider  acetaminophen  (TYLENOL ) 500 MG tablet Take 500-1,000 mg by mouth See admin instructions. Take 2 tablets (1000 mg) by mouth scheduled at bedtime & every 6 hours if needed for pain.    [provider]  amoxicillin -clavulanate (AUGMENTIN ) 875-125 MG tablet Take 1 tablet by mouth every 12 (twelve) hours for 10 days. 02/12/24 02/22/24  Romelle Booty, MD  buPROPion  (WELLBUTRIN  XL) 150 MG 24 hr tablet Take 1 tablet (150 mg total) by mouth in the morning and at bedtime. 02/08/24   Kayla Jeoffrey RAMAN, FNP  busPIRone  (BUSPAR ) 7.5 MG tablet Take 1 tablet by mouth twice daily 01/31/24   Kayla Jeoffrey RAMAN, FNP  celecoxib  (CELEBREX ) 100 MG capsule Take 1 capsule (100 mg total) by mouth 2 (two) times daily. 08/09/23   Kayla Jeoffrey RAMAN, FNP  dicyclomine  (BENTYL ) 10 MG capsule TAKE 1 CAPSULE BY MOUTH TWICE DAILY AS NEEDED FOR  ABDOMINAL  PAIN 02/18/24   Kayla Jeoffrey RAMAN, FNP  escitalopram   (LEXAPRO ) 5 MG tablet Take 2 tablets (10 mg total) by mouth daily for 14 days, THEN 1 tablet (5 mg total) daily for 14 days. Patient not taking: No sig reported 12/17/23 01/17/24  Kayla Jeoffrey RAMAN, FNP  gabapentin  (NEURONTIN ) 300 MG capsule Take 1 capsule (300 mg total) by mouth 3 (three) times daily. 10/22/23   Tobie Franky SQUIBB, DPM  HYDROcodone -acetaminophen  (NORCO/VICODIN) 5-325 MG tablet Take 2 tablets by mouth every 4 (four) hours as needed. 02/12/24   Romelle Booty, MD  KRILL OIL PO Take 500 mg by mouth in the morning.    [provider]  levothyroxine  (SYNTHROID ) 50 MCG tablet Take 1 tablet (50 mcg total) by mouth daily. 01/01/23   Kayla Jeoffrey RAMAN, FNP  lisinopril -hydrochlorothiazide  (ZESTORETIC ) 20-12.5 MG tablet Take 1 tablet by mouth daily. 08/09/23   Kayla Jeoffrey RAMAN, FNP  ondansetron  (ZOFRAN ) 4 MG tablet Take 1 tablet (4 mg total) by mouth every 8 (eight) hours as needed for nausea or vomiting. 02/12/24   Romelle Booty, MD  pravastatin  (PRAVACHOL ) 40 MG tablet Take 1 tablet by mouth once daily 12/04/23   Howard, Amber S, FNP  QUEtiapine  (SEROQUEL ) 50 MG tablet Take 1 tablet (50 mg total) by mouth at bedtime. Patient not taking: Reported on 02/20/2024 12/04/23   Kayla Jeoffrey RAMAN, FNP  TURMERIC PO Take 1,500 mg by mouth in the morning.    [provider]  Vitamin D -Vitamin K (  VITAMIN K2 -VITAMIN D3 PO) Take 1 tablet by mouth in the morning. 100 mg/50 mcg    [provider]    Allergies: Hydroxyzine     Review of Systems  Constitutional:  Negative for chills and fever.    Updated Vital Signs BP 115/69   Pulse 78   Temp 98.2 F (36.8 C) (Oral)   Resp 16   Ht 4' 10 (1.473 m)   Wt 78.5 kg   SpO2 95%   BMI 36.16 kg/m   Physical Exam Vitals and nursing note reviewed.  Constitutional:      General: She is not in acute distress.    Appearance: She is well-developed.  HENT:     Head: Normocephalic and atraumatic.  Eyes:     Conjunctiva/sclera: Conjunctivae normal.   Cardiovascular:     Rate and Rhythm: Normal rate and regular rhythm.     Heart sounds: No murmur heard. Pulmonary:     Effort: Pulmonary effort is normal. No respiratory distress.     Breath sounds: Normal breath sounds.  Abdominal:     Palpations: Abdomen is soft.     Tenderness: There is no abdominal tenderness.  Musculoskeletal:        General: No swelling.     Cervical back: Neck supple.  Skin:    General: Skin is warm and dry.     Capillary Refill: Capillary refill takes 2 to 3 seconds.  Neurological:     Mental Status: She is alert.  Psychiatric:        Mood and Affect: Mood normal.     (all labs ordered are listed, but only abnormal results are displayed) Labs Reviewed  COMPREHENSIVE METABOLIC PANEL WITH GFR - Abnormal; Notable for the following components:      Result Value   Glucose, Bld 108 (*)    Creatinine, Ser 1.22 (*)    AST 12 (*)    Alkaline Phosphatase 130 (*)    GFR, Estimated 49 (*)    All other components within normal limits  URINALYSIS, ROUTINE W REFLEX MICROSCOPIC - Abnormal; Notable for the following components:   Leukocytes,Ua TRACE (*)    Bacteria, UA RARE (*)    Non Squamous Epithelial 0-5 (*)    All other components within normal limits  RESP PANEL BY RT-PCR (RSV, FLU A&B, COVID)  RVPGX2  CBC WITH DIFFERENTIAL/PLATELET  LIPASE, BLOOD    EKG: None  Radiology: No results found.   Procedures   Medications Ordered in the ED  lactated ringers  bolus 1,000 mL (0 mLs Intravenous Stopped 02/22/24 1039)  acetaminophen  (TYLENOL ) tablet 1,000 mg (1,000 mg Oral Given 02/22/24 0934)  prochlorperazine  (COMPAZINE ) injection 10 mg (10 mg Intravenous Given 02/22/24 0935)                                    Medical Decision Making Amount and/or Complexity of Data Reviewed Labs: ordered.  Risk OTC drugs. Prescription drug management.     Differential diagnosis includes but is not limited to diverticulitis, intestinal perforation or abscess,  sepsis, covid, dehydration, electrolyte abnormality  ED Course:  Upon initial evaluation, patient is very well-appearing, stable vital signs.  Abdomen is soft and nontender.  She reports her abdominal pain that initially brought her in on 8/19 has resolved.  Low concern for any worsening of her diverticulitis or complication such as abscess or perforation.  No vomiting.  She has been able to  keep water  and fluids down at home, but feels she is dehydrated.  On exam, does not look significantly clinically dehydrated as cap refill 2-3 seconds, mucous membranes do not appear dry.  However, we will obtain CBC, CMP for further evaluation and to check electrolytes.  Given she reports body aches and headache, will also obtain COVID test.  Labs Ordered: I Ordered, and personally interpreted labs.  The pertinent results include:   CBC within normal limits CMP with elevated creatinine 1.22, this has remained stable since her visit 10 days ago which was also creatinine 1.22.  This does seem elevated from baseline at 0.96 taken 5 months ago.  Mildly elevated alk phos at 130. Lipase within normal limits Urinalysis with bacteria noted, but no nitrates leukocytes. COVID, flu, RSV negative   Medications Given: LR bolus Compazine  Tylenol   Upon re-evaluation, patient remains well-appearing with stable vitals.  She is tolerating sips of water  at bedside.  Has not had any vomiting here.  We discussed that she still has an elevated creatinine at 1.22, this is likely from her decreased p.o. intake.  She is able to tolerate p.o. intake and I encouraged her to increase intake of water  and Pedialyte at home.  She does not have any electrolyte abnormalities.  Abdomen is soft nontender, no leukocytosis, low concern for complication of diverticulitis.  Do not feel a repeat CT abdomen pelvis is needed today.  She does report diarrhea, but feel this is likely secondary to the antibiotic she is on.  She is not having profuse  diarrhea, foul-smelling diarrhea, no systemic signs of illness, no recent travel or hospitalizations, lower concern for C. difficile or other infectious etiology to her diarrhea.  Urinalysis does show bacteria, but patient denying any urinary symptoms or abdominal pain, no indication for antibiotics at this time.  Her COVID, flu, RSV testing is negative.  Question of her body aches is more secondary to her fibromyalgia.  She does not have any systemic signs of infection such as fever, tachycardia, or leukocytosis, no concern for sepsis or other infectious etiology at this time.  Patient stable and appropriate for discharge home.    Impression: Dehydration  Disposition:  The patient was discharged home with instructions to follow-up with GI for colonoscopy once symptoms resolved.  She states her PCP has already referred her to GI and she is in the process of setting up an appointment.  Continue to drink water  and electrolyte solution such as Pedialyte at home to keep well-hydrated.  Take last dose of Augmentin  today.  She declines any nausea medication at this time, stating she has some at home. Return precautions given.    Record Review: External records from outside source obtained and reviewed including ER note from 8/19     This chart was dictated using voice recognition software, Dragon. Despite the best efforts of this provider to proofread and correct errors, errors may still occur which can change documentation meaning.       Final diagnoses:  Dehydration    ED Discharge Orders     None          Veta Palma, PA-C 02/22/24 1127    Ruthe Cornet, DO 02/22/24 1206

## 2024-03-01 ENCOUNTER — Other Ambulatory Visit: Payer: Self-pay | Admitting: Family Medicine

## 2024-03-01 DIAGNOSIS — E039 Hypothyroidism, unspecified: Secondary | ICD-10-CM

## 2024-03-03 ENCOUNTER — Other Ambulatory Visit: Payer: Self-pay | Admitting: Family Medicine

## 2024-03-03 DIAGNOSIS — E782 Mixed hyperlipidemia: Secondary | ICD-10-CM

## 2024-03-11 ENCOUNTER — Encounter: Payer: Self-pay | Admitting: Family Medicine

## 2024-03-11 ENCOUNTER — Ambulatory Visit: Admitting: Family Medicine

## 2024-03-11 VITALS — BP 115/75 | HR 78 | Temp 97.6°F | Ht <= 58 in | Wt 171.4 lb

## 2024-03-11 DIAGNOSIS — F339 Major depressive disorder, recurrent, unspecified: Secondary | ICD-10-CM | POA: Diagnosis not present

## 2024-03-11 DIAGNOSIS — F419 Anxiety disorder, unspecified: Secondary | ICD-10-CM | POA: Diagnosis not present

## 2024-03-11 DIAGNOSIS — K219 Gastro-esophageal reflux disease without esophagitis: Secondary | ICD-10-CM

## 2024-03-11 DIAGNOSIS — E782 Mixed hyperlipidemia: Secondary | ICD-10-CM

## 2024-03-11 DIAGNOSIS — I10 Essential (primary) hypertension: Secondary | ICD-10-CM | POA: Diagnosis not present

## 2024-03-11 DIAGNOSIS — M797 Fibromyalgia: Secondary | ICD-10-CM

## 2024-03-11 DIAGNOSIS — R7301 Impaired fasting glucose: Secondary | ICD-10-CM | POA: Diagnosis not present

## 2024-03-11 DIAGNOSIS — Z0000 Encounter for general adult medical examination without abnormal findings: Secondary | ICD-10-CM

## 2024-03-11 DIAGNOSIS — Z0001 Encounter for general adult medical examination with abnormal findings: Secondary | ICD-10-CM

## 2024-03-11 DIAGNOSIS — E559 Vitamin D deficiency, unspecified: Secondary | ICD-10-CM | POA: Diagnosis not present

## 2024-03-11 DIAGNOSIS — E039 Hypothyroidism, unspecified: Secondary | ICD-10-CM | POA: Diagnosis not present

## 2024-03-11 LAB — TIQ-MISC: QUESTION:: 92918

## 2024-03-11 MED ORDER — BUPROPION HCL ER (XL) 150 MG PO TB24: 150.0000 mg | ORAL_TABLET | Freq: Two times a day (BID) | ORAL | 1 refills | Status: AC

## 2024-03-11 MED ORDER — BUSPIRONE HCL 7.5 MG PO TABS: 7.5000 mg | ORAL_TABLET | Freq: Two times a day (BID) | ORAL | 1 refills | Status: AC

## 2024-03-11 NOTE — Assessment & Plan Note (Signed)
 Stable on current regimen. Continue Wellbutrin  and Buspar . Followed by psychiatry

## 2024-03-11 NOTE — Progress Notes (Signed)
 Subjective:  HPI: Denise Morales is a 63 y.o. female presenting on 03/11/2024 for Follow-up (3 month f/u /Questions about medications )   HPI Patient is in today for chronic condition management. Will complete CPE as she is overdue.  PMH includes HTN, GERD, depression, HLD, hypothyroidism, fibromyalgia, and anxiety. No new concerns.  HTN: well controlled on Lisinopril -hydrochlorothiazide  20-12.5mg  daily  GERD: diet controlled  Depression and Anxiety: well controlled on wellbutrin  XL 150mg  BID and Buspar  7.5mg  BID  HLD: on Krill oil and Pravastatin   Hypothyroidism: on Synthroid  50mcg daily   Fibromyalgia: on Celebrex   Common bile duct dilation: 17mm on CT 02/22/24, scheduled with GI     03/11/2024   10:42 AM 02/20/2024   10:24 AM 12/10/2023   11:00 AM 09/18/2023   11:14 AM  GAD 7 : Generalized Anxiety Score  Nervous, Anxious, on Edge 1 1 0   Control/stop worrying 1 0 1   Worry too much - different things 1 0 1   Trouble relaxing 1 0 0   Restless 0 0 0   Easily annoyed or irritable 0 0 0   Afraid - awful might happen 0 0 0   Total GAD 7 Score 4 1 2    Anxiety Difficulty  Somewhat difficult Somewhat difficult      Information is confidential and restricted. Go to Review Flowsheets to unlock data.       03/11/2024   10:42 AM 02/20/2024   10:24 AM 12/10/2023   11:00 AM 09/18/2023   11:11 AM 08/09/2023    3:49 PM  Depression screen PHQ 2/9  Decreased Interest 0 1 1  0  Down, Depressed, Hopeless 0 0 0  0  PHQ - 2 Score 0 1 1  0  Altered sleeping 1 0 0  0  Tired, decreased energy 1 1 1  1   Change in appetite 0 3 0  0  Feeling bad or failure about yourself  0 0 0  0  Trouble concentrating 0 0 0  0  Moving slowly or fidgety/restless 0 0 0  0  Suicidal thoughts 0 0 0  0  PHQ-9 Score 2 5 2  1   Difficult doing work/chores Not difficult at all Somewhat difficult Somewhat difficult       Information is confidential and restricted. Go to Review Flowsheets to unlock data.      Health Maintenance  Topic Date Due   Zoster Vaccines- Shingrix (1 of 2) 03/11/2024 (Originally 03/26/1980)   COVID-19 Vaccine (3 - 2025-26 season) 03/27/2024 (Originally 02/25/2024)   Cervical Cancer Screening (HPV/Pap Cotest)  04/02/2024 (Originally 03/26/1989)   Pneumococcal Vaccine: 50+ Years (1 of 1 - PCV) 05/10/2024 (Originally 03/26/2009)   Influenza Vaccine  09/23/2024 (Originally 01/25/2024)   Mammogram  01/29/2026   Colonoscopy  01/08/2030   Hepatitis C Screening  Completed   HIV Screening  Completed   Hepatitis B Vaccines 19-59 Average Risk  Aged Out   HPV VACCINES  Aged Out   Meningococcal B Vaccine  Aged Out   DTaP/Tdap/Td  Discontinued      Review of Systems  HENT: Negative.    Eyes: Negative.   Respiratory: Negative.    Cardiovascular: Negative.   Gastrointestinal: Negative.   Genitourinary: Negative.   Musculoskeletal: Negative.   Skin: Negative.   Neurological: Negative.   Endo/Heme/Allergies: Negative.   Psychiatric/Behavioral:  Positive for depression.   All other systems reviewed and are negative.   Relevant past medical history reviewed and updated as indicated.  Past Medical History:  Diagnosis Date   Allergy    Anemia    Anxiety    Arthritis    Bunion of left foot 07/2023   Depression    Diverticulitis    Essential hypertension    Fibromyalgia    GERD (gastroesophageal reflux disease)    Heart murmur    History of hiatal hernia    Hyperlipidemia    Hypothyroidism    Iron  deficiency anemia    Melanoma (HCC)    face   Pre-diabetes      Past Surgical History:  Procedure Laterality Date   ABDOMINAL HYSTERECTOMY  1985   AIKEN OSTEOTOMY Right 06/05/2022   Procedure: KATRINA OSTEOTOMY;  Surgeon: Tobie Franky SQUIBB, DPM;  Location: [facility];  Service: Podiatry;  Laterality: Right;   BICEPT TENODESIS Right 12/05/2022   Procedure: RIGHT BICEPS TENODESIS;  Surgeon: Addie Cordella Hamilton, MD;  Location: [facility] OR;  Service:  Orthopedics;  Laterality: Right;   CAPSULOTOMY METATARSOPHALANGEAL Right 06/05/2022   Procedure: CAPSULOTOMY METATARSOPHALANGEAL;  Surgeon: Tobie Franky SQUIBB, DPM;  Location: [facility];  Service: Podiatry;  Laterality: Right;   CHOLECYSTECTOMY     COLONOSCOPY     COLONOSCOPY WITH ESOPHAGOGASTRODUODENOSCOPY (EGD)  12/2022   HALLUX VALGUS LAPIDUS Right 06/05/2022   Procedure: HALLUX VALGUS LAPIDUS;  Surgeon: Tobie Franky SQUIBB, DPM;  Location: [facility];  Service: Podiatry;  Laterality: Right;   HALLUX VALGUS LAPIDUS Left 09/03/2023   Procedure: ROMAYNE LOOK;  Surgeon: Tobie Franky SQUIBB, DPM;  Location: [facility];  Service: Orthopedics/Podiatry;  Laterality: Left;  POPLITEAL BLOCK   HAMMER TOE SURGERY Right 06/05/2022   Procedure: HAMMER TOE CORRECTION;  Surgeon: Tobie Franky SQUIBB, DPM;  Location: [facility];  Service: Podiatry;  Laterality: Right;   HERNIA REPAIR  2025   JOINT REPLACEMENT  2022 - 2024   REVERSE SHOULDER ARTHROPLASTY Right 12/05/2022   Procedure: RIGHT REVERSE SHOULDER ARTHROPLASTY;  Surgeon: Addie Cordella Hamilton, MD;  Location: [facility] OR;  Service: Orthopedics;  Laterality: Right;   REVERSE SHOULDER ARTHROPLASTY Left 04/16/2023   Procedure: REVERSE SHOULDER ARTHROPLASTY;  Surgeon: Addie Cordella Hamilton, MD;  Location: [facility] OR;  Service: Orthopedics;  Laterality: Left;   SHOULDER ARTHROSCOPY WITH ROTATOR CUFF REPAIR AND SUBACROMIAL DECOMPRESSION Right 08/12/2020   Procedure: RIGHT SHOULDER ARTHROSCOPY WITH ROTATOR CUFF REPAIR AND SUBACROMIAL DECOMPRESSION;  Surgeon: Vernetta Lonni GRADE, MD;  Location: [facility];  Service: Orthopedics;  Laterality: Right;   STERIOD INJECTION Left 09/03/2023   Procedure: STEROID INJECTION;  Surgeon: Tobie Franky SQUIBB, DPM;  Location: [facility];  Service: Orthopedics/Podiatry;  Laterality: Left;   TOTAL KNEE ARTHROPLASTY Right 12/24/2020   Procedure: RIGHT TOTAL KNEE ARTHROPLASTY;  Surgeon:  Vernetta Lonni GRADE, MD;  Location: [facility];  Service: Orthopedics;  Laterality: Right;   TOTAL KNEE ARTHROPLASTY Left 07/15/2021   Procedure: LEFT TOTAL KNEE ARTHROPLASTY;  Surgeon: Vernetta Lonni GRADE, MD;  Location: [facility];  Service: Orthopedics;  Laterality: Left;   TUBAL LIGATION  1987   XI ROBOTIC ASSISTED PARAESOPHAGEAL HERNIA REPAIR N/A 03/12/2023   Procedure: XI ROBOTIC ASSISTED PARAESOPHAGEAL HERNIA REPAIR WITH FUNDOPLICATION;  Surgeon: Signe Mitzie LABOR, MD;  Location: [facility];  Service: General;  Laterality: N/A;  180    Allergies and medications reviewed and updated.   Current Outpatient Medications:    acetaminophen  (TYLENOL ) 500 MG tablet, Take 500-1,000 mg by mouth See admin instructions. Take 2 tablets (1000 mg) by mouth scheduled at bedtime & every 6 hours  if needed for pain., Disp: , Rfl:    celecoxib  (CELEBREX ) 100 MG capsule, Take 1 capsule (100 mg total) by mouth 2 (two) times daily., Disp: 180 capsule, Rfl: 1   dicyclomine  (BENTYL ) 10 MG capsule, TAKE 1 CAPSULE BY MOUTH TWICE DAILY AS NEEDED FOR  ABDOMINAL  PAIN, Disp: 180 capsule, Rfl: 0   gabapentin  (NEURONTIN ) 300 MG capsule, Take 1 capsule (300 mg total) by mouth 3 (three) times daily., Disp: 90 capsule, Rfl: 3   KRILL OIL PO, Take 500 mg by mouth in the morning., Disp: , Rfl:    levothyroxine  (SYNTHROID ) 50 MCG tablet, Take 1 tablet by mouth once daily, Disp: 90 tablet, Rfl: 0   lisinopril -hydrochlorothiazide  (ZESTORETIC ) 20-12.5 MG tablet, Take 1 tablet by mouth daily., Disp: 90 tablet, Rfl: 3   pravastatin  (PRAVACHOL ) 40 MG tablet, Take 1 tablet by mouth once daily, Disp: 90 tablet, Rfl: 0   TURMERIC PO, Take 1,500 mg by mouth in the morning., Disp: , Rfl:    Vitamin D -Vitamin K (VITAMIN K2 -VITAMIN D3 PO), Take 1 tablet by mouth in the morning. 100 mg/50 mcg, Disp: , Rfl:    buPROPion  (WELLBUTRIN  XL) 150 MG 24 hr tablet, Take 1 tablet (150 mg total) by mouth in the morning and at bedtime., Disp: 180 tablet,  Rfl: 1   busPIRone  (BUSPAR ) 7.5 MG tablet, Take 1 tablet (7.5 mg total) by mouth 2 (two) times daily., Disp: 180 tablet, Rfl: 1  Allergies  Allergen Reactions   Hydroxyzine  Other (See Comments)    hallucinations    Objective:   BP 115/75   Pulse 78   Temp 97.6 F (36.4 C)   Ht 4' 10 (1.473 m)   Wt 171 lb 6.4 oz (77.7 kg)   SpO2 97%   BMI 35.82 kg/m      03/11/2024   10:33 AM 02/22/2024   11:30 AM 02/22/2024   11:00 AM  Vitals with BMI  Height 4' 10    Weight 171 lbs 6 oz    BMI 35.83    Systolic 115 115 884  Diastolic 75 69 69  Pulse 78 76 78     Physical Exam Vitals and nursing note reviewed.  Constitutional:      Appearance: Normal appearance. She is obese.  HENT:     Head: Normocephalic and atraumatic.     Right Ear: Tympanic membrane, ear canal and external ear normal.     Left Ear: Tympanic membrane, ear canal and external ear normal.     Nose: Nose normal.     Mouth/Throat:     Mouth: Mucous membranes are moist.     Pharynx: Oropharynx is clear.  Eyes:     Extraocular Movements: Extraocular movements intact.     Conjunctiva/sclera: Conjunctivae normal.     Pupils: Pupils are equal, round, and reactive to light.  Cardiovascular:     Rate and Rhythm: Normal rate and regular rhythm.     Pulses: Normal pulses.     Heart sounds: Normal heart sounds.  Pulmonary:     Effort: Pulmonary effort is normal.     Breath sounds: Normal breath sounds.  Abdominal:     General: Bowel sounds are normal.     Palpations: Abdomen is soft.  Musculoskeletal:        General: Normal range of motion.     Cervical back: Normal range of motion and neck supple.  Skin:    General: Skin is warm and dry.     Capillary  Refill: Capillary refill takes less than 2 seconds.  Neurological:     General: No focal deficit present.     Mental Status: She is alert and oriented to person, place, and time. Mental status is at baseline.  Psychiatric:        Mood and Affect: Mood normal.         Behavior: Behavior normal.        Thought Content: Thought content normal.        Judgment: Judgment normal.     Assessment & Plan:  Physical exam, annual Assessment & Plan: Today your medical history was reviewed and routine physical exam with labs was performed. Recommend 150 minutes of moderate intensity exercise weekly and consuming a well-balanced diet. Advised to stop smoking if a smoker, avoid smoking if a non-smoker, limit alcohol consumption to 1 drink per day for women and 2 drinks per day for men, and avoid illicit drug use. Counseled in mental health awareness and when to seek medical care. Vaccine maintenance discussed. Appropriate health maintenance items reviewed. Return to office in 1 year for annual physical exam.   Orders: -     CBC with Differential/Platelet -     Comprehensive metabolic panel with GFR -     Lipid panel -     TSH -     Hemoglobin A1c -     Vitamin D , 25-OH,Total,IA(Refl)  Depression, recurrent (HCC) Assessment & Plan: Stable on current regimen. Continue Wellbutrin  and Buspar . Followed by psychiatry  Orders: -     buPROPion  HCl ER (XL); Take 1 tablet (150 mg total) by mouth in the morning and at bedtime.  Dispense: 180 tablet; Refill: 1 -     Vitamin D , 25-OH,Total,IA(Refl)  Anxiety Assessment & Plan: Stable on current regimen. Followed by psychiatry. Continue wellbutrin , buspar   Orders: -     buPROPion  HCl ER (XL); Take 1 tablet (150 mg total) by mouth in the morning and at bedtime.  Dispense: 180 tablet; Refill: 1 -     Vitamin D , 25-OH,Total,IA(Refl)  Hypothyroidism, unspecified type Assessment & Plan: Continue synthroid  50mcg daily. Euthyroid on exam.   The correct intake of thyroid  hormone (Levothyroxine , Synthroid ), is on empty stomach first thing in the morning, with water , separated by at least 30 minutes from breakfast and other medications,  and separated by more than 4 hours from calcium, iron , multivitamins, acid reflux  medications (PPIs).   - This medication is a life-long medication and will be needed to correct thyroid  hormone imbalances for the rest of your life.  The dose may change from time to time, based on thyroid  blood work.   - It is extremely important to be consistent taking this medication, near the same time each morning.   -AVOID TAKING PRODUCTS CONTAINING BIOTIN (commonly found in Hair, Skin, Nails vitamins) AS IT INTERFERES WITH THE VALIDITY OF THYROID  FUNCTION BLOOD TESTS.   Orders: -     TSH  Mixed hyperlipidemia Assessment & Plan: Continue Pravastatin  and krill oil. I recommend consuming a heart healthy diet such as Mediterranean diet or DASH diet with whole grains, fruits, vegetable, fish, lean meats, nuts, and olive oil. Limit sweets and processed foods. I also encourage moderate intensity exercise 150 minutes weekly. This is 3-5 times weekly for 30-50 minutes each session. Goal should be pace of 3 miles/hours, or walking 1.5 miles in 30 minutes. The 10-year ASCVD risk score (Arnett DK, et al., 2019) is: 5.2%   Orders: -  Lipid panel  Essential hypertension Assessment & Plan: Chronic controlled on Lisinopril  hydrochlorothiazide  20-12.5mg  daily. Continue heart healthy diet such as Mediterranean diet with whole grains, fruits, vegetable, fish, lean meats, nuts, and olive oil. Limit salt. Encouraged moderate walking, 3-5 times/week for 30-50 minutes each session. Aim for at least 150 minutes.week. Goal should be pace of 3 miles/hours, or walking 1.5 miles in 30 minutes. Avoid tobacco products. Avoid excess alcohol. Take medications as prescribed and bring medications and blood pressure log with cuff to each office visit. Seek medical care for chest pain, palpitations, shortness of breath with exertion, dizziness/lightheadedness, vision changes, recurrent headaches, or swelling of extremities. Follow up 6 months or sooner if needed  Orders: -     CBC with Differential/Platelet -      Comprehensive metabolic panel with GFR -     Lipid panel -     Hemoglobin A1c  Gastroesophageal reflux disease without esophagitis Assessment & Plan: Diet controlled. Elevated HOB if needed and avoid lying down 2-3 hours after eating, avoid coffee, alcohol, chocolate, fatty foods, citrus, carbonated beverages, spicy foods, late meals, and smoking. Return to office if symptoms return or worsen and seek medical care for difficulty swallowing, bleeding, anemia, weight loss, or recurrent vomiting.     Fibromyalgia  Fibromyalgia syndrome Assessment & Plan: Continue Celebrex    Other orders -     busPIRone  HCl; Take 1 tablet (7.5 mg total) by mouth 2 (two) times daily.  Dispense: 180 tablet; Refill: 1     Follow up plan: Return in about 4 weeks (around 04/08/2024) for AWV, WTM.  Jeoffrey GORMAN Barrio, FNP

## 2024-03-11 NOTE — Assessment & Plan Note (Signed)
 Continue synthroid  50mcg daily. Euthyroid on exam.   The correct intake of thyroid  hormone (Levothyroxine , Synthroid ), is on empty stomach first thing in the morning, with water , separated by at least 30 minutes from breakfast and other medications,  and separated by more than 4 hours from calcium, iron , multivitamins, acid reflux medications (PPIs).   - This medication is a life-long medication and will be needed to correct thyroid  hormone imbalances for the rest of your life.  The dose may change from time to time, based on thyroid  blood work.   - It is extremely important to be consistent taking this medication, near the same time each morning.   -AVOID TAKING PRODUCTS CONTAINING BIOTIN (commonly found in Hair, Skin, Nails vitamins) AS IT INTERFERES WITH THE VALIDITY OF THYROID  FUNCTION BLOOD TESTS.

## 2024-03-11 NOTE — Assessment & Plan Note (Signed)
 Stable on current regimen. Followed by psychiatry. Continue wellbutrin , buspar 

## 2024-03-11 NOTE — Assessment & Plan Note (Signed)
 Continue Celebrex

## 2024-03-11 NOTE — Assessment & Plan Note (Signed)
 Chronic controlled on Lisinopril  hydrochlorothiazide  20-12.5mg  daily. Continue heart healthy diet such as Mediterranean diet with whole grains, fruits, vegetable, fish, lean meats, nuts, and olive oil. Limit salt. Encouraged moderate walking, 3-5 times/week for 30-50 minutes each session. Aim for at least 150 minutes.week. Goal should be pace of 3 miles/hours, or walking 1.5 miles in 30 minutes. Avoid tobacco products. Avoid excess alcohol. Take medications as prescribed and bring medications and blood pressure log with cuff to each office visit. Seek medical care for chest pain, palpitations, shortness of breath with exertion, dizziness/lightheadedness, vision changes, recurrent headaches, or swelling of extremities. Follow up 6 months or sooner if needed

## 2024-03-11 NOTE — Assessment & Plan Note (Signed)

## 2024-03-11 NOTE — Assessment & Plan Note (Signed)
 Diet controlled. Elevated HOB if needed and avoid lying down 2-3 hours after eating, avoid coffee, alcohol, chocolate, fatty foods, citrus, carbonated beverages, spicy foods, late meals, and smoking. Return to office if symptoms return or worsen and seek medical care for difficulty swallowing, bleeding, anemia, weight loss, or recurrent vomiting.

## 2024-03-11 NOTE — Assessment & Plan Note (Signed)
 Continue Pravastatin  and krill oil. I recommend consuming a heart healthy diet such as Mediterranean diet or DASH diet with whole grains, fruits, vegetable, fish, lean meats, nuts, and olive oil. Limit sweets and processed foods. I also encourage moderate intensity exercise 150 minutes weekly. This is 3-5 times weekly for 30-50 minutes each session. Goal should be pace of 3 miles/hours, or walking 1.5 miles in 30 minutes. The 10-year ASCVD risk score (Arnett DK, et al., 2019) is: 5.2%

## 2024-03-11 NOTE — H&P (View-Only) (Signed)
 Subjective:  HPI: Denise Morales is a 63 y.o. female presenting on 03/11/2024 for Follow-up (3 month f/u /Questions about medications )   HPI Patient is in today for chronic condition management. Will complete CPE as she is overdue.  PMH includes HTN, GERD, depression, HLD, hypothyroidism, fibromyalgia, and anxiety. No new concerns.  HTN: well controlled on Lisinopril -hydrochlorothiazide  20-12.5mg  daily  GERD: diet controlled  Depression and Anxiety: well controlled on wellbutrin  XL 150mg  BID and Buspar  7.5mg  BID  HLD: on Krill oil and Pravastatin   Hypothyroidism: on Synthroid  50mcg daily   Fibromyalgia: on Celebrex   Common bile duct dilation: 17mm on CT 02/22/24, scheduled with GI     03/11/2024   10:42 AM 02/20/2024   10:24 AM 12/10/2023   11:00 AM 09/18/2023   11:14 AM  GAD 7 : Generalized Anxiety Score  Nervous, Anxious, on Edge 1 1 0   Control/stop worrying 1 0 1   Worry too much - different things 1 0 1   Trouble relaxing 1 0 0   Restless 0 0 0   Easily annoyed or irritable 0 0 0   Afraid - awful might happen 0 0 0   Total GAD 7 Score 4 1 2    Anxiety Difficulty  Somewhat difficult Somewhat difficult      Information is confidential and restricted. Go to Review Flowsheets to unlock data.       03/11/2024   10:42 AM 02/20/2024   10:24 AM 12/10/2023   11:00 AM 09/18/2023   11:11 AM 08/09/2023    3:49 PM  Depression screen PHQ 2/9  Decreased Interest 0 1 1  0  Down, Depressed, Hopeless 0 0 0  0  PHQ - 2 Score 0 1 1  0  Altered sleeping 1 0 0  0  Tired, decreased energy 1 1 1  1   Change in appetite 0 3 0  0  Feeling bad or failure about yourself  0 0 0  0  Trouble concentrating 0 0 0  0  Moving slowly or fidgety/restless 0 0 0  0  Suicidal thoughts 0 0 0  0  PHQ-9 Score 2 5 2  1   Difficult doing work/chores Not difficult at all Somewhat difficult Somewhat difficult       Information is confidential and restricted. Go to Review Flowsheets to unlock data.      Health Maintenance  Topic Date Due   Zoster Vaccines- Shingrix (1 of 2) 03/11/2024 (Originally 03/26/1980)   COVID-19 Vaccine (3 - 2025-26 season) 03/27/2024 (Originally 02/25/2024)   Cervical Cancer Screening (HPV/Pap Cotest)  04/02/2024 (Originally 03/26/1989)   Pneumococcal Vaccine: 50+ Years (1 of 1 - PCV) 05/10/2024 (Originally 03/26/2009)   Influenza Vaccine  09/23/2024 (Originally 01/25/2024)   Mammogram  01/29/2026   Colonoscopy  01/08/2030   Hepatitis C Screening  Completed   HIV Screening  Completed   Hepatitis B Vaccines 19-59 Average Risk  Aged Out   HPV VACCINES  Aged Out   Meningococcal B Vaccine  Aged Out   DTaP/Tdap/Td  Discontinued      Review of Systems  HENT: Negative.    Eyes: Negative.   Respiratory: Negative.    Cardiovascular: Negative.   Gastrointestinal: Negative.   Genitourinary: Negative.   Musculoskeletal: Negative.   Skin: Negative.   Neurological: Negative.   Endo/Heme/Allergies: Negative.   Psychiatric/Behavioral:  Positive for depression.   All other systems reviewed and are negative.   Relevant past medical history reviewed and updated as indicated.  Past Medical History:  Diagnosis Date   Allergy    Anemia    Anxiety    Arthritis    Bunion of left foot 07/2023   Depression    Diverticulitis    Essential hypertension    Fibromyalgia    GERD (gastroesophageal reflux disease)    Heart murmur    History of hiatal hernia    Hyperlipidemia    Hypothyroidism    Iron  deficiency anemia    Melanoma (HCC)    face   Pre-diabetes      Past Surgical History:  Procedure Laterality Date   ABDOMINAL HYSTERECTOMY  1985   AIKEN OSTEOTOMY Right 06/05/2022   Procedure: KATRINA OSTEOTOMY;  Surgeon: Tobie Franky SQUIBB, DPM;  Location: [facility];  Service: Podiatry;  Laterality: Right;   BICEPT TENODESIS Right 12/05/2022   Procedure: RIGHT BICEPS TENODESIS;  Surgeon: Addie Cordella Hamilton, MD;  Location: [facility] OR;  Service:  Orthopedics;  Laterality: Right;   CAPSULOTOMY METATARSOPHALANGEAL Right 06/05/2022   Procedure: CAPSULOTOMY METATARSOPHALANGEAL;  Surgeon: Tobie Franky SQUIBB, DPM;  Location: [facility];  Service: Podiatry;  Laterality: Right;   CHOLECYSTECTOMY     COLONOSCOPY     COLONOSCOPY WITH ESOPHAGOGASTRODUODENOSCOPY (EGD)  12/2022   HALLUX VALGUS LAPIDUS Right 06/05/2022   Procedure: HALLUX VALGUS LAPIDUS;  Surgeon: Tobie Franky SQUIBB, DPM;  Location: [facility];  Service: Podiatry;  Laterality: Right;   HALLUX VALGUS LAPIDUS Left 09/03/2023   Procedure: ROMAYNE LOOK;  Surgeon: Tobie Franky SQUIBB, DPM;  Location: [facility];  Service: Orthopedics/Podiatry;  Laterality: Left;  POPLITEAL BLOCK   HAMMER TOE SURGERY Right 06/05/2022   Procedure: HAMMER TOE CORRECTION;  Surgeon: Tobie Franky SQUIBB, DPM;  Location: [facility];  Service: Podiatry;  Laterality: Right;   HERNIA REPAIR  2025   JOINT REPLACEMENT  2022 - 2024   REVERSE SHOULDER ARTHROPLASTY Right 12/05/2022   Procedure: RIGHT REVERSE SHOULDER ARTHROPLASTY;  Surgeon: Addie Cordella Hamilton, MD;  Location: [facility] OR;  Service: Orthopedics;  Laterality: Right;   REVERSE SHOULDER ARTHROPLASTY Left 04/16/2023   Procedure: REVERSE SHOULDER ARTHROPLASTY;  Surgeon: Addie Cordella Hamilton, MD;  Location: [facility] OR;  Service: Orthopedics;  Laterality: Left;   SHOULDER ARTHROSCOPY WITH ROTATOR CUFF REPAIR AND SUBACROMIAL DECOMPRESSION Right 08/12/2020   Procedure: RIGHT SHOULDER ARTHROSCOPY WITH ROTATOR CUFF REPAIR AND SUBACROMIAL DECOMPRESSION;  Surgeon: Vernetta Lonni GRADE, MD;  Location: [facility];  Service: Orthopedics;  Laterality: Right;   STERIOD INJECTION Left 09/03/2023   Procedure: STEROID INJECTION;  Surgeon: Tobie Franky SQUIBB, DPM;  Location: [facility];  Service: Orthopedics/Podiatry;  Laterality: Left;   TOTAL KNEE ARTHROPLASTY Right 12/24/2020   Procedure: RIGHT TOTAL KNEE ARTHROPLASTY;  Surgeon:  Vernetta Lonni GRADE, MD;  Location: [facility];  Service: Orthopedics;  Laterality: Right;   TOTAL KNEE ARTHROPLASTY Left 07/15/2021   Procedure: LEFT TOTAL KNEE ARTHROPLASTY;  Surgeon: Vernetta Lonni GRADE, MD;  Location: [facility];  Service: Orthopedics;  Laterality: Left;   TUBAL LIGATION  1987   XI ROBOTIC ASSISTED PARAESOPHAGEAL HERNIA REPAIR N/A 03/12/2023   Procedure: XI ROBOTIC ASSISTED PARAESOPHAGEAL HERNIA REPAIR WITH FUNDOPLICATION;  Surgeon: Signe Mitzie LABOR, MD;  Location: [facility];  Service: General;  Laterality: N/A;  180    Allergies and medications reviewed and updated.   Current Outpatient Medications:    acetaminophen  (TYLENOL ) 500 MG tablet, Take 500-1,000 mg by mouth See admin instructions. Take 2 tablets (1000 mg) by mouth scheduled at bedtime & every 6 hours  if needed for pain., Disp: , Rfl:    celecoxib  (CELEBREX ) 100 MG capsule, Take 1 capsule (100 mg total) by mouth 2 (two) times daily., Disp: 180 capsule, Rfl: 1   dicyclomine  (BENTYL ) 10 MG capsule, TAKE 1 CAPSULE BY MOUTH TWICE DAILY AS NEEDED FOR  ABDOMINAL  PAIN, Disp: 180 capsule, Rfl: 0   gabapentin  (NEURONTIN ) 300 MG capsule, Take 1 capsule (300 mg total) by mouth 3 (three) times daily., Disp: 90 capsule, Rfl: 3   KRILL OIL PO, Take 500 mg by mouth in the morning., Disp: , Rfl:    levothyroxine  (SYNTHROID ) 50 MCG tablet, Take 1 tablet by mouth once daily, Disp: 90 tablet, Rfl: 0   lisinopril -hydrochlorothiazide  (ZESTORETIC ) 20-12.5 MG tablet, Take 1 tablet by mouth daily., Disp: 90 tablet, Rfl: 3   pravastatin  (PRAVACHOL ) 40 MG tablet, Take 1 tablet by mouth once daily, Disp: 90 tablet, Rfl: 0   TURMERIC PO, Take 1,500 mg by mouth in the morning., Disp: , Rfl:    Vitamin D -Vitamin K (VITAMIN K2 -VITAMIN D3 PO), Take 1 tablet by mouth in the morning. 100 mg/50 mcg, Disp: , Rfl:    buPROPion  (WELLBUTRIN  XL) 150 MG 24 hr tablet, Take 1 tablet (150 mg total) by mouth in the morning and at bedtime., Disp: 180 tablet,  Rfl: 1   busPIRone  (BUSPAR ) 7.5 MG tablet, Take 1 tablet (7.5 mg total) by mouth 2 (two) times daily., Disp: 180 tablet, Rfl: 1  Allergies  Allergen Reactions   Hydroxyzine  Other (See Comments)    hallucinations    Objective:   BP 115/75   Pulse 78   Temp 97.6 F (36.4 C)   Ht 4' 10 (1.473 m)   Wt 171 lb 6.4 oz (77.7 kg)   SpO2 97%   BMI 35.82 kg/m      03/11/2024   10:33 AM 02/22/2024   11:30 AM 02/22/2024   11:00 AM  Vitals with BMI  Height 4' 10    Weight 171 lbs 6 oz    BMI 35.83    Systolic 115 115 884  Diastolic 75 69 69  Pulse 78 76 78     Physical Exam Vitals and nursing note reviewed.  Constitutional:      Appearance: Normal appearance. She is obese.  HENT:     Head: Normocephalic and atraumatic.     Right Ear: Tympanic membrane, ear canal and external ear normal.     Left Ear: Tympanic membrane, ear canal and external ear normal.     Nose: Nose normal.     Mouth/Throat:     Mouth: Mucous membranes are moist.     Pharynx: Oropharynx is clear.  Eyes:     Extraocular Movements: Extraocular movements intact.     Conjunctiva/sclera: Conjunctivae normal.     Pupils: Pupils are equal, round, and reactive to light.  Cardiovascular:     Rate and Rhythm: Normal rate and regular rhythm.     Pulses: Normal pulses.     Heart sounds: Normal heart sounds.  Pulmonary:     Effort: Pulmonary effort is normal.     Breath sounds: Normal breath sounds.  Abdominal:     General: Bowel sounds are normal.     Palpations: Abdomen is soft.  Musculoskeletal:        General: Normal range of motion.     Cervical back: Normal range of motion and neck supple.  Skin:    General: Skin is warm and dry.     Capillary  Refill: Capillary refill takes less than 2 seconds.  Neurological:     General: No focal deficit present.     Mental Status: She is alert and oriented to person, place, and time. Mental status is at baseline.  Psychiatric:        Mood and Affect: Mood normal.         Behavior: Behavior normal.        Thought Content: Thought content normal.        Judgment: Judgment normal.     Assessment & Plan:  Physical exam, annual Assessment & Plan: Today your medical history was reviewed and routine physical exam with labs was performed. Recommend 150 minutes of moderate intensity exercise weekly and consuming a well-balanced diet. Advised to stop smoking if a smoker, avoid smoking if a non-smoker, limit alcohol consumption to 1 drink per day for women and 2 drinks per day for men, and avoid illicit drug use. Counseled in mental health awareness and when to seek medical care. Vaccine maintenance discussed. Appropriate health maintenance items reviewed. Return to office in 1 year for annual physical exam.   Orders: -     CBC with Differential/Platelet -     Comprehensive metabolic panel with GFR -     Lipid panel -     TSH -     Hemoglobin A1c -     Vitamin D , 25-OH,Total,IA(Refl)  Depression, recurrent (HCC) Assessment & Plan: Stable on current regimen. Continue Wellbutrin  and Buspar . Followed by psychiatry  Orders: -     buPROPion  HCl ER (XL); Take 1 tablet (150 mg total) by mouth in the morning and at bedtime.  Dispense: 180 tablet; Refill: 1 -     Vitamin D , 25-OH,Total,IA(Refl)  Anxiety Assessment & Plan: Stable on current regimen. Followed by psychiatry. Continue wellbutrin , buspar   Orders: -     buPROPion  HCl ER (XL); Take 1 tablet (150 mg total) by mouth in the morning and at bedtime.  Dispense: 180 tablet; Refill: 1 -     Vitamin D , 25-OH,Total,IA(Refl)  Hypothyroidism, unspecified type Assessment & Plan: Continue synthroid  50mcg daily. Euthyroid on exam.   The correct intake of thyroid  hormone (Levothyroxine , Synthroid ), is on empty stomach first thing in the morning, with water , separated by at least 30 minutes from breakfast and other medications,  and separated by more than 4 hours from calcium, iron , multivitamins, acid reflux  medications (PPIs).   - This medication is a life-long medication and will be needed to correct thyroid  hormone imbalances for the rest of your life.  The dose may change from time to time, based on thyroid  blood work.   - It is extremely important to be consistent taking this medication, near the same time each morning.   -AVOID TAKING PRODUCTS CONTAINING BIOTIN (commonly found in Hair, Skin, Nails vitamins) AS IT INTERFERES WITH THE VALIDITY OF THYROID  FUNCTION BLOOD TESTS.   Orders: -     TSH  Mixed hyperlipidemia Assessment & Plan: Continue Pravastatin  and krill oil. I recommend consuming a heart healthy diet such as Mediterranean diet or DASH diet with whole grains, fruits, vegetable, fish, lean meats, nuts, and olive oil. Limit sweets and processed foods. I also encourage moderate intensity exercise 150 minutes weekly. This is 3-5 times weekly for 30-50 minutes each session. Goal should be pace of 3 miles/hours, or walking 1.5 miles in 30 minutes. The 10-year ASCVD risk score (Arnett DK, et al., 2019) is: 5.2%   Orders: -  Lipid panel  Essential hypertension Assessment & Plan: Chronic controlled on Lisinopril  hydrochlorothiazide  20-12.5mg  daily. Continue heart healthy diet such as Mediterranean diet with whole grains, fruits, vegetable, fish, lean meats, nuts, and olive oil. Limit salt. Encouraged moderate walking, 3-5 times/week for 30-50 minutes each session. Aim for at least 150 minutes.week. Goal should be pace of 3 miles/hours, or walking 1.5 miles in 30 minutes. Avoid tobacco products. Avoid excess alcohol. Take medications as prescribed and bring medications and blood pressure log with cuff to each office visit. Seek medical care for chest pain, palpitations, shortness of breath with exertion, dizziness/lightheadedness, vision changes, recurrent headaches, or swelling of extremities. Follow up 6 months or sooner if needed  Orders: -     CBC with Differential/Platelet -      Comprehensive metabolic panel with GFR -     Lipid panel -     Hemoglobin A1c  Gastroesophageal reflux disease without esophagitis Assessment & Plan: Diet controlled. Elevated HOB if needed and avoid lying down 2-3 hours after eating, avoid coffee, alcohol, chocolate, fatty foods, citrus, carbonated beverages, spicy foods, late meals, and smoking. Return to office if symptoms return or worsen and seek medical care for difficulty swallowing, bleeding, anemia, weight loss, or recurrent vomiting.     Fibromyalgia  Fibromyalgia syndrome Assessment & Plan: Continue Celebrex    Other orders -     busPIRone  HCl; Take 1 tablet (7.5 mg total) by mouth 2 (two) times daily.  Dispense: 180 tablet; Refill: 1     Follow up plan: Return in about 4 weeks (around 04/08/2024) for AWV, WTM.  Jeoffrey GORMAN Barrio, FNP

## 2024-03-13 ENCOUNTER — Ambulatory Visit: Payer: Self-pay | Admitting: Family Medicine

## 2024-03-13 LAB — CBC WITH DIFFERENTIAL/PLATELET
Absolute Lymphocytes: 2484 {cells}/uL (ref 850–3900)
Absolute Monocytes: 442 {cells}/uL (ref 200–950)
Basophils Absolute: 83 {cells}/uL (ref 0–200)
Basophils Relative: 1.2 %
Eosinophils Absolute: 221 {cells}/uL (ref 15–500)
Eosinophils Relative: 3.2 %
HCT: 38.6 % (ref 35.0–45.0)
Hemoglobin: 12.6 g/dL (ref 11.7–15.5)
MCH: 29.4 pg (ref 27.0–33.0)
MCHC: 32.6 g/dL (ref 32.0–36.0)
MCV: 90 fL (ref 80.0–100.0)
MPV: 10.3 fL (ref 7.5–12.5)
Monocytes Relative: 6.4 %
Neutro Abs: 3671 {cells}/uL (ref 1500–7800)
Neutrophils Relative %: 53.2 %
Platelets: 329 Thousand/uL (ref 140–400)
RBC: 4.29 Million/uL (ref 3.80–5.10)
RDW: 13.6 % (ref 11.0–15.0)
Total Lymphocyte: 36 %
WBC: 6.9 Thousand/uL (ref 3.8–10.8)

## 2024-03-13 LAB — LIPID PANEL
Cholesterol: 140 mg/dL (ref <200)
HDL: 55 mg/dL (ref >=50)
LDL Cholesterol (Calc): 60 mg/dL
Non-HDL Cholesterol (Calc): 85 mg/dL (ref <130)
Total CHOL/HDL Ratio: 2.5 (calc) (ref <5.0)
Triglycerides: 178 mg/dL — ABNORMAL HIGH (ref <150)

## 2024-03-13 LAB — COMPREHENSIVE METABOLIC PANEL WITH GFR
AG Ratio: 1.8 (calc) (ref 1.0–2.5)
ALT: 9 U/L (ref 6–29)
AST: 13 U/L (ref 10–35)
Albumin: 4.3 g/dL (ref 3.6–5.1)
Alkaline phosphatase (APISO): 81 U/L (ref 37–153)
BUN/Creatinine Ratio: 16 (calc) (ref 6–22)
BUN: 17 mg/dL (ref 7–25)
CO2: 25 mmol/L (ref 20–32)
Calcium: 9.4 mg/dL (ref 8.6–10.4)
Chloride: 104 mmol/L (ref 98–110)
Creat: 1.07 mg/dL — ABNORMAL HIGH (ref 0.50–1.05)
Globulin: 2.4 g/dL (ref 1.9–3.7)
Glucose, Bld: 115 mg/dL — ABNORMAL HIGH (ref 65–99)
Potassium: 4.1 mmol/L (ref 3.5–5.3)
Sodium: 141 mmol/L (ref 135–146)
Total Bilirubin: 0.4 mg/dL (ref 0.2–1.2)
Total Protein: 6.7 g/dL (ref 6.1–8.1)
eGFR: 58 mL/min/1.73m2 — ABNORMAL LOW (ref >=60)

## 2024-03-13 LAB — TEST AUTHORIZATION

## 2024-03-13 LAB — HEMOGLOBIN A1C
Hgb A1c MFr Bld: 6 % — ABNORMAL HIGH (ref <5.7)
Mean Plasma Glucose: 126 mg/dL
eAG (mmol/L): 7 mmol/L

## 2024-03-13 LAB — TSH: TSH: 1.18 m[IU]/L (ref 0.40–4.50)

## 2024-03-13 LAB — VITAMIN D 25 HYDROXY (VIT D DEFICIENCY, FRACTURES): Vit D, 25-Hydroxy: 61 ng/mL (ref 30–100)

## 2024-03-19 ENCOUNTER — Telehealth: Payer: Self-pay | Admitting: Podiatry

## 2024-03-19 NOTE — Telephone Encounter (Signed)
 Pt left message stating she was not sure if she notified us  that she now has medicare.   I left message for pt that we did have [employer] medicare as primary with medicaid as secondary.

## 2024-03-21 ENCOUNTER — Telehealth: Payer: Self-pay

## 2024-03-21 NOTE — Telephone Encounter (Signed)
 Welbutrin Xl ER 24 hr. Tab. PA has been approved for pt. From 02/25/2024-06/25/2024 thru [facility]

## 2024-03-25 ENCOUNTER — Telehealth: Payer: Self-pay

## 2024-03-25 NOTE — Telephone Encounter (Signed)
 [facility] faxed in order request for history and physical for completed for outpatient surgery. Surgery is to be completed on 04/07/2024 Forms were approved, signed and completed by provider Jeoffrey Barrio.  Signed on 03/20/24 Faxed to [facility] at [phone]  Phone: [phone] 3:30 pm SRP, CMA

## 2024-03-26 ENCOUNTER — Encounter: Admitting: Podiatry

## 2024-03-26 ENCOUNTER — Telehealth: Payer: Self-pay | Admitting: Podiatry

## 2024-03-26 NOTE — Telephone Encounter (Signed)
 DOS- 04/07/2024  CORRECTION BUNION BY PHALANX OSTEOTOMY BIL- [id number]  [employer] EFFECTIVE DATE- 02/25/2024  DEDUCTIBLE- $0 REMAINING- $0 OOP- $4150 REMAINING- $4150 COINSURANCE- 0%  PER [employer] PORTAL, NO PRIOR AUTH IS REQUIRED FOR CPT CODE 28298 (2 UNITS)  AUTH FOR ABOVE CPT CODE HAD PREVIOUSLY BEEN APPROVED WHEN PT HAD [employer]. AUTH# [id number] 03/17/2024-05/15/2024

## 2024-03-31 ENCOUNTER — Other Ambulatory Visit: Payer: Self-pay

## 2024-03-31 ENCOUNTER — Encounter
Admission: RE | Admit: 2024-03-31 | Discharge: 2024-03-31 | Disposition: A | Discharge status: Home / Self Care | Source: Ambulatory Visit | Attending: Podiatry | Admitting: Podiatry

## 2024-03-31 VITALS — Ht <= 58 in | Wt 165.0 lb

## 2024-03-31 DIAGNOSIS — I10 Essential (primary) hypertension: Secondary | ICD-10-CM

## 2024-03-31 DIAGNOSIS — Z01818 Encounter for other preprocedural examination: Secondary | ICD-10-CM

## 2024-03-31 HISTORY — DX: Bunion of right foot: M21.611

## 2024-03-31 NOTE — Patient Instructions (Addendum)
 Your procedure is scheduled on: MONDAY 04/07/24  Report to the Registration Desk on the 1st floor of the [facility]. To find out your arrival time, please call [phone] between 1PM - 3PM on: FRIDAY 04/04/24 If your arrival time is 6:00 am, do not arrive before that time as the [facility] entrance doors do not open until 6:00 am.  REMEMBER: Instructions that are not followed completely may result in serious medical risk, up to and including death; or upon the discretion of your surgeon and anesthesiologist your surgery may need to be rescheduled.  Do not eat food after midnight the night before surgery.  No gum chewing or hard candies.  You may however, drink CLEAR liquids up to 2 hours before you are scheduled to arrive for your surgery. Do not drink anything within 2 hours of your scheduled arrival time.  Clear liquids include: - water   - apple juice without pulp - gatorade (not RED colors) - black coffee or tea (Do NOT add milk or creamers to the coffee or tea) Do NOT drink anything that is not on this list.  One week prior to surgery: Stop Anti-inflammatories (NSAIDS) such as Advil , Aleve, Ibuprofen , Motrin , Naproxen, Naprosyn and Aspirin  based products such as Excedrin, Goody's Powder, BC Powder. Stop ANY OVER THE COUNTER supplements until after surgery.  You may however, continue to take Tylenol  if needed for pain up until the day of surgery.  Continue taking all of your other prescription medications up until the day of surgery.  ON THE DAY OF SURGERY ONLY TAKE THESE MEDICATIONS WITH SIPS OF WATER :  gabapentin  (NEURONTIN )  levothyroxine  (SYNTHROID )  pravastatin  (PRAVACHOL )  busPIRone  (BUSPAR )  buPROPion  (WELLBUTRIN  XL)   Use inhalers on the day of surgery and bring to the hospital.  No Alcohol for 24 hours before or after surgery.  No Smoking including e-cigarettes for 24 hours before surgery.  No chewable tobacco products for at least 6 hours before surgery.   No nicotine patches on the day of surgery.  Do not use any recreational drugs for at least a week (preferably 2 weeks) before your surgery.  Please be advised that the combination of cocaine and anesthesia may have negative outcomes, up to and including death. If you test positive for cocaine, your surgery will be cancelled.  On the morning of surgery brush your teeth with toothpaste and water , you may rinse your mouth with mouthwash if you wish. Do not swallow any toothpaste or mouthwash.  Use CHG Soap or wipes as directed on instruction sheet.  Do not wear jewelry, make-up, hairpins, clips or nail polish.  For welded (permanent) jewelry: bracelets, anklets, waist bands, etc.  Please have this removed prior to surgery.  If it is not removed, there is a chance that hospital personnel will need to cut it off on the day of surgery.  Do not wear lotions, powders, or perfumes.   Do not shave body hair from the neck down 48 hours before surgery.  Contact lenses, hearing aids and dentures may not be worn into surgery.  Do not bring valuables to the hospital. [facility] is not responsible for any missing/lost belongings or valuables.   Bring your C-PAP to the hospital in case you may have to spend the night.   Notify your doctor if there is any change in your medical condition (cold, fever, infection).  Wear comfortable clothing (specific to your surgery type) to the hospital.  After surgery, you can help prevent lung complications  by doing breathing exercises.  Take deep breaths and cough every 1-2 hours. Your doctor may order a device called an Incentive Spirometer to help you take deep breaths. When coughing or sneezing, hold a pillow firmly against your incision with both hands. This is called "splinting." Doing this helps protect your incision. It also decreases belly discomfort.  If you are being discharged the day of surgery, you will not be allowed to drive home.  You will  need a responsible individual to drive you home and stay with you for 24 hours after surgery.   If you are taking public transportation, you will need to have a responsible individual with you.  Please call the Pre-admissions Testing Dept. at [phone] if you have any questions about these instructions.  Surgery Visitation Policy:  Patients having surgery or a procedure may have two visitors.  Children under the age of 52 must have an adult with them who is not the patient.   Merchandiser, retail to address health-related social needs:  [email or web address]                                                                                                             Preparing for Surgery with CHLORHEXIDINE  GLUCONATE (CHG) Soap  Chlorhexidine  Gluconate (CHG) Soap  o An antiseptic cleaner that kills germs and bonds with the skin to continue killing germs even after washing  o Used for showering the night before surgery and morning of surgery  Before surgery, you can play an important role by reducing the number of germs on your skin.  CHG (Chlorhexidine  gluconate) soap is an antiseptic cleanser which kills germs and bonds with the skin to continue killing germs even after washing.  Please do not use if you have an allergy to CHG or antibacterial soaps. If your skin becomes reddened/irritated stop using the CHG.  1. Shower the NIGHT BEFORE SURGERY with CHG soap.  2. If you choose to wash your hair, wash your hair first as usual with your normal shampoo.  3. After shampooing, rinse your hair and body thoroughly to remove the shampoo.  4. Use CHG as you would any other liquid soap. You can apply CHG directly to the skin and wash gently with a clean washcloth.  5. Apply the CHG soap to your body only from the neck down. Do not use on open wounds or open sores. Avoid contact with your eyes, ears, mouth, and genitals (private parts). Wash face and genitals (private  parts) with your normal soap.  6. Wash thoroughly, paying special attention to the area where your surgery will be performed.  7. Thoroughly rinse your body with warm water .  8. Do not shower/wash with your normal soap after using and rinsing off the CHG soap.  9. Do not use lotions, oils, etc., after showering with CHG.  10. Pat yourself dry with a clean towel.  11. Wear clean pajamas to bed the night before surgery.  12. Place clean sheets on your bed the night of your shower and  do not sleep with pets.  13. Do not apply any deodorants/lotions/powders.  14. Please wear clean clothes to the hospital.  15. Remember to brush your teeth with your regular toothpaste.

## 2024-04-04 ENCOUNTER — Encounter
Admission: RE | Admit: 2024-04-04 | Discharge: 2024-04-04 | Disposition: A | Discharge status: Home / Self Care | Source: Ambulatory Visit | Attending: Podiatry | Admitting: Podiatry

## 2024-04-04 DIAGNOSIS — Z01818 Encounter for other preprocedural examination: Secondary | ICD-10-CM

## 2024-04-04 DIAGNOSIS — Z01810 Encounter for preprocedural cardiovascular examination: Secondary | ICD-10-CM | POA: Insufficient documentation

## 2024-04-04 DIAGNOSIS — I10 Essential (primary) hypertension: Secondary | ICD-10-CM | POA: Insufficient documentation

## 2024-04-07 ENCOUNTER — Other Ambulatory Visit: Payer: Self-pay | Admitting: Podiatry

## 2024-04-07 ENCOUNTER — Ambulatory Visit: Admitting: Certified Registered"

## 2024-04-07 ENCOUNTER — Ambulatory Visit
Admission: RE | Admit: 2024-04-07 | Discharge: 2024-04-07 | Disposition: A | Discharge status: Home / Self Care | Attending: Podiatry | Admitting: Podiatry

## 2024-04-07 ENCOUNTER — Encounter
Admission: RE | Disposition: A | Discharge status: Home / Self Care | Payer: Self-pay | Source: Home / Self Care | Attending: Podiatry

## 2024-04-07 ENCOUNTER — Encounter: Payer: Self-pay | Admitting: Podiatry

## 2024-04-07 ENCOUNTER — Other Ambulatory Visit: Payer: Self-pay

## 2024-04-07 ENCOUNTER — Ambulatory Visit

## 2024-04-07 DIAGNOSIS — I10 Essential (primary) hypertension: Secondary | ICD-10-CM | POA: Insufficient documentation

## 2024-04-07 DIAGNOSIS — K219 Gastro-esophageal reflux disease without esophagitis: Secondary | ICD-10-CM | POA: Diagnosis not present

## 2024-04-07 DIAGNOSIS — Z79890 Hormone replacement therapy (postmenopausal): Secondary | ICD-10-CM | POA: Diagnosis not present

## 2024-04-07 DIAGNOSIS — I129 Hypertensive chronic kidney disease with stage 1 through stage 4 chronic kidney disease, or unspecified chronic kidney disease: Secondary | ICD-10-CM | POA: Diagnosis not present

## 2024-04-07 DIAGNOSIS — E785 Hyperlipidemia, unspecified: Secondary | ICD-10-CM | POA: Insufficient documentation

## 2024-04-07 DIAGNOSIS — M2012 Hallux valgus (acquired), left foot: Secondary | ICD-10-CM | POA: Insufficient documentation

## 2024-04-07 DIAGNOSIS — M19071 Primary osteoarthritis, right ankle and foot: Secondary | ICD-10-CM | POA: Insufficient documentation

## 2024-04-07 DIAGNOSIS — M797 Fibromyalgia: Secondary | ICD-10-CM | POA: Insufficient documentation

## 2024-04-07 DIAGNOSIS — Z79899 Other long term (current) drug therapy: Secondary | ICD-10-CM | POA: Diagnosis not present

## 2024-04-07 DIAGNOSIS — M19079 Primary osteoarthritis, unspecified ankle and foot: Secondary | ICD-10-CM | POA: Diagnosis not present

## 2024-04-07 DIAGNOSIS — G4730 Sleep apnea, unspecified: Secondary | ICD-10-CM | POA: Diagnosis not present

## 2024-04-07 DIAGNOSIS — N189 Chronic kidney disease, unspecified: Secondary | ICD-10-CM | POA: Diagnosis not present

## 2024-04-07 DIAGNOSIS — F419 Anxiety disorder, unspecified: Secondary | ICD-10-CM | POA: Diagnosis not present

## 2024-04-07 DIAGNOSIS — Z791 Long term (current) use of non-steroidal anti-inflammatories (NSAID): Secondary | ICD-10-CM | POA: Insufficient documentation

## 2024-04-07 DIAGNOSIS — M2042 Other hammer toe(s) (acquired), left foot: Secondary | ICD-10-CM | POA: Diagnosis not present

## 2024-04-07 DIAGNOSIS — M2011 Hallux valgus (acquired), right foot: Secondary | ICD-10-CM | POA: Insufficient documentation

## 2024-04-07 DIAGNOSIS — F418 Other specified anxiety disorders: Secondary | ICD-10-CM | POA: Diagnosis not present

## 2024-04-07 DIAGNOSIS — Z6835 Body mass index (BMI) 35.0-35.9, adult: Secondary | ICD-10-CM | POA: Insufficient documentation

## 2024-04-07 DIAGNOSIS — E669 Obesity, unspecified: Secondary | ICD-10-CM | POA: Diagnosis not present

## 2024-04-07 DIAGNOSIS — E039 Hypothyroidism, unspecified: Secondary | ICD-10-CM | POA: Insufficient documentation

## 2024-04-07 DIAGNOSIS — M2041 Other hammer toe(s) (acquired), right foot: Secondary | ICD-10-CM | POA: Diagnosis not present

## 2024-04-07 HISTORY — PX: BUNIONECTOMY: SHX129

## 2024-04-07 SURGERY — BUNIONECTOMY
Anesthesia: General | Site: Toe | Laterality: Bilateral

## 2024-04-07 MED ORDER — CHLORHEXIDINE GLUCONATE 0.12 % MT SOLN
OROMUCOSAL | Status: AC
  Filled 2024-04-07: qty 15

## 2024-04-07 MED ORDER — DROPERIDOL 2.5 MG/ML IJ SOLN: 0.6250 mg | Freq: Once | INTRAMUSCULAR | Status: DC | PRN

## 2024-04-07 MED ORDER — EPHEDRINE SULFATE-NACL 50-0.9 MG/10ML-% IV SOSY
PREFILLED_SYRINGE | INTRAVENOUS | Status: DC | PRN
  Administered 2024-04-07: 5 mg via INTRAVENOUS

## 2024-04-07 MED ORDER — ACETAMINOPHEN 10 MG/ML IV SOLN
INTRAVENOUS | Status: AC
  Filled 2024-04-07: qty 100

## 2024-04-07 MED ORDER — CEFAZOLIN SODIUM-DEXTROSE 2-4 GM/100ML-% IV SOLN
2.0000 g | Freq: Once | INTRAVENOUS | Status: AC
  Administered 2024-04-07: 2 g via INTRAVENOUS

## 2024-04-07 MED ORDER — ACETAMINOPHEN 10 MG/ML IV SOLN: 1000.0000 mg | Freq: Once | INTRAVENOUS | Status: DC | PRN

## 2024-04-07 MED ORDER — LIDOCAINE HCL (CARDIAC) PF 100 MG/5ML IV SOSY
PREFILLED_SYRINGE | INTRAVENOUS | Status: DC | PRN
Start: 2024-04-07 — End: 2024-04-07
  Administered 2024-04-07: 100 mg via INTRAVENOUS

## 2024-04-07 MED ORDER — LACTATED RINGERS IV SOLN: INTRAVENOUS | Status: DC

## 2024-04-07 MED ORDER — 0.9 % SODIUM CHLORIDE (POUR BTL) OPTIME
TOPICAL | Status: DC | PRN
Start: 2024-04-07 — End: 2024-04-07
  Administered 2024-04-07: 500 mL

## 2024-04-07 MED ORDER — IBUPROFEN 800 MG PO TABS: 800.0000 mg | ORAL_TABLET | Freq: Four times a day (QID) | ORAL | 1 refills | Status: DC | PRN

## 2024-04-07 MED ORDER — OXYCODONE-ACETAMINOPHEN 5-325 MG PO TABS: 1.0000 | ORAL_TABLET | ORAL | 0 refills | Status: DC | PRN

## 2024-04-07 MED ORDER — LIDOCAINE-EPINEPHRINE (PF) 1 %-1:200000 IJ SOLN
INTRAMUSCULAR | Status: DC | PRN
Start: 2024-04-07 — End: 2024-04-07
  Administered 2024-04-07: 10 mL

## 2024-04-07 MED ORDER — CEFAZOLIN SODIUM-DEXTROSE 2-4 GM/100ML-% IV SOLN
INTRAVENOUS | Status: AC
  Filled 2024-04-07: qty 100

## 2024-04-07 MED ORDER — ORAL CARE MOUTH RINSE: 15.0000 mL | Freq: Once | OROMUCOSAL | Status: AC

## 2024-04-07 MED ORDER — PROPOFOL 10 MG/ML IV BOLUS
INTRAVENOUS | Status: DC | PRN
  Administered 2024-04-07: 100 ug/kg/min via INTRAVENOUS

## 2024-04-07 MED ORDER — MIDAZOLAM HCL 2 MG/2ML IJ SOLN
INTRAMUSCULAR | Status: AC
  Filled 2024-04-07: qty 2

## 2024-04-07 MED ORDER — OXYCODONE HCL 5 MG PO TABS: 5.0000 mg | ORAL_TABLET | Freq: Once | ORAL | Status: DC | PRN

## 2024-04-07 MED ORDER — LIDOCAINE-EPINEPHRINE (PF) 1 %-1:200000 IJ SOLN
INTRAMUSCULAR | Status: AC
  Filled 2024-04-07: qty 30

## 2024-04-07 MED ORDER — FENTANYL CITRATE (PF) 100 MCG/2ML IJ SOLN: 25.0000 ug | INTRAMUSCULAR | Status: DC | PRN

## 2024-04-07 MED ORDER — ACETAMINOPHEN 10 MG/ML IV SOLN
INTRAVENOUS | Status: DC | PRN
Start: 2024-04-07 — End: 2024-04-07
  Administered 2024-04-07: 1000 mg via INTRAVENOUS

## 2024-04-07 MED ORDER — CHLORHEXIDINE GLUCONATE 0.12 % MT SOLN
15.0000 mL | Freq: Once | OROMUCOSAL | Status: AC
  Administered 2024-04-07: 15 mL via OROMUCOSAL

## 2024-04-07 MED ORDER — MIDAZOLAM HCL 2 MG/2ML IJ SOLN
INTRAMUSCULAR | Status: DC | PRN
  Administered 2024-04-07: 2 mg via INTRAVENOUS

## 2024-04-07 MED ORDER — OXYCODONE HCL 5 MG/5ML PO SOLN: 5.0000 mg | Freq: Once | ORAL | Status: DC | PRN

## 2024-04-07 SURGICAL SUPPLY — 38 items
BIT DRILL MEMOFIX 1.7 (BIT) IMPLANT
BLADE SW THK.38XMED LNG THN (BLADE) IMPLANT
BNDG COMPR 6X5.8 VLCR NS LF (GAUZE/BANDAGES/DRESSINGS) ×2 IMPLANT
BNDG ELASTIC 4X5.8 VLCR NS LF (GAUZE/BANDAGES/DRESSINGS) ×2 IMPLANT
BNDG ESMARCH 4X12 STRL LF (GAUZE/BANDAGES/DRESSINGS) ×2 IMPLANT
BNDG GAUZE DERMACEA FLUFF 4 (GAUZE/BANDAGES/DRESSINGS) ×2 IMPLANT
COVER LIGHT HANDLE STERIS (MISCELLANEOUS) ×4 IMPLANT
CUFF TOURN SGL QUICK 18X4 (TOURNIQUET CUFF) ×2 IMPLANT
DRAPE FLUOR MINI C-ARM 54X84 (DRAPES) IMPLANT
DURAPREP 26ML APPLICATOR (WOUND CARE) ×2 IMPLANT
ELECTRODE REM PT RTRN 9FT ADLT (ELECTROSURGICAL) ×2 IMPLANT
GAUZE SPONGE 4X4 12PLY STRL (GAUZE/BANDAGES/DRESSINGS) ×2 IMPLANT
GAUZE XEROFORM 1X8 LF (GAUZE/BANDAGES/DRESSINGS) ×2 IMPLANT
GLOVE BIO SURGEON STRL SZ7.5 (GLOVE) ×2 IMPLANT
GLOVE SURG SYN 7.0 PF PI (GLOVE) ×2 IMPLANT
GOWN STRL REUS W/ TWL LRG LVL3 (GOWN DISPOSABLE) ×4 IMPLANT
KIT TURNOVER KIT A (KITS) ×2 IMPLANT
MANIFOLD NEPTUNE II (INSTRUMENTS) ×2 IMPLANT
NS IRRIG 500ML POUR BTL (IV SOLUTION) ×2 IMPLANT
PACK EXTREMITY ARMC (MISCELLANEOUS) ×2 IMPLANT
PADDING CAST BLEND 4X4 NS (MISCELLANEOUS) ×8 IMPLANT
PENCIL SMOKE EVACUATOR (MISCELLANEOUS) ×4 IMPLANT
SHOE POST OP SQ TOE SM (MISCELLANEOUS) IMPLANT
SPLINT CAST 1 STEP 4X30 (MISCELLANEOUS) ×2 IMPLANT
STAPLE MEMOFIX MS 10X10X10 (Staple) IMPLANT
STOCKINETTE IMPERVIOUS 9X36 MD (GAUZE/BANDAGES/DRESSINGS) ×2 IMPLANT
SUT MNCRL+ 5-0 UNDYED PC-3 (SUTURE) IMPLANT
SUT MON AB 3-0 SH27 (SUTURE) IMPLANT
SUT MON AB 5-0 P3 18 (SUTURE) IMPLANT
SUT PROLENE 3 0 PS 2 (SUTURE) IMPLANT
SUT VIC AB 0 CT1 27XCR 8 STRN (SUTURE) IMPLANT
SUT VIC AB 2-0 SH 27XBRD (SUTURE) IMPLANT
SUT VIC AB 3-0 SH 27X BRD (SUTURE) IMPLANT
SUTURE ETHLN 4-0 FS2 18XMF BLK (SUTURE) IMPLANT
SUTURE MNCRL 4-018XMF (SUTURE) IMPLANT
Squared toe post-op boot IMPLANT
TRAP FLUID SMOKE EVACUATOR (MISCELLANEOUS) ×2 IMPLANT
WATER STERILE IRR 500ML POUR (IV SOLUTION) ×2 IMPLANT

## 2024-04-07 NOTE — Transfer of Care (Signed)
 Immediate Anesthesia Transfer of Care Note  Patient: Denise Morales  Procedure(s) Performed: ROMAYNE (Bilateral: Toe)  Patient Location: PACU  Anesthesia Type:General  Level of Consciousness: awake  Airway & Oxygen Therapy: Patient Spontanous Breathing  Post-op Assessment: Report given to RN and Post -op Vital signs reviewed and stable  Post vital signs: stable  Last Vitals:  Vitals Value Taken Time  BP    Temp    Pulse    Resp    SpO2      Last Pain:  Vitals:   04/07/24 1155  TempSrc: Temporal  PainSc: 0-No pain         Complications: There were no known notable events for this encounter.

## 2024-04-07 NOTE — Anesthesia Postprocedure Evaluation (Signed)
 Anesthesia Post Note  Patient: Denise Morales  Procedure(s) Performed: BUNIONECTOMY (Bilateral: Toe)  Patient location during evaluation: PACU Anesthesia Type: General Level of consciousness: awake and alert Pain management: pain level controlled Vital Signs Assessment: post-procedure vital signs reviewed and stable Respiratory status: spontaneous breathing, nonlabored ventilation and respiratory function stable Cardiovascular status: blood pressure returned to baseline and stable Postop Assessment: no apparent nausea or vomiting Anesthetic complications: no   There were no known notable events for this encounter.   Last Vitals:  Vitals:   04/07/24 1515 04/07/24 1529  BP: 125/78 (!) 130/91  Pulse: 63 60  Resp: 11 15  Temp:  (!) 36.1 C  SpO2: 95% 99%    Last Pain:  Vitals:   04/07/24 1529  TempSrc: Temporal  PainSc: 0-No pain                 Camellia Merilee Louder

## 2024-04-07 NOTE — Anesthesia Preprocedure Evaluation (Addendum)
 Anesthesia Evaluation  Patient identified by MRN, date of birth, ID band Patient awake    Reviewed: Allergy & Precautions, NPO status , Patient's Chart, lab work & pertinent test results  History of Anesthesia Complications Negative for: history of anesthetic complications  Airway Mallampati: III  TM Distance: >3 FB Neck ROM: full    Dental no notable dental hx.    Pulmonary neg pulmonary ROS   Pulmonary exam normal        Cardiovascular Exercise Tolerance: Good hypertension, On Medications Normal cardiovascular exam     Neuro/Psych  PSYCHIATRIC DISORDERS Anxiety Depression       GI/Hepatic Neg liver ROS,GERD  Medicated and Controlled,,  Endo/Other  Hypothyroidism    Renal/GU Renal InsufficiencyRenal disease     Musculoskeletal  (+) Arthritis ,  Fibromyalgia -  Abdominal  (+) + obese  Peds  Hematology   Anesthesia Other Findings Past Medical History: No date: Allergy No date: Anemia No date: Anxiety No date: Arthritis 07/2023: Bunion of left foot No date: Depression No date: Essential hypertension No date: Fibromyalgia No date: GERD (gastroesophageal reflux disease) No date: Heart murmur No date: History of hiatal hernia No date: Hyperlipidemia No date: Hypothyroidism No date: Iron  deficiency anemia No date: Melanoma (HCC)     Comment:  face No date: Pre-diabetes  Past Surgical History: 1985: ABDOMINAL HYSTERECTOMY 06/05/2022: KATRINA OSTEOTOMY; Right     Comment:  Procedure: KATRINA BOTTS;  Surgeon: Tobie Franky SQUIBB,               DPM;  Location: [facility];  Service:               Podiatry;  Laterality: Right; 12/05/2022: BICEPT TENODESIS; Right     Comment:  Procedure: RIGHT BICEPS TENODESIS;  Surgeon: Addie Cordella Hamilton, MD;  Location: [facility] OR;  Service:               Orthopedics;  Laterality: Right; 06/05/2022: CAPSULOTOMY METATARSOPHALANGEAL; Right     Comment:   Procedure: CAPSULOTOMY METATARSOPHALANGEAL;  Surgeon:               Tobie Franky SQUIBB, DPM;  Location: [facility];  Service: Podiatry;  Laterality: Right; No date: CHOLECYSTECTOMY No date: COLONOSCOPY 12/2022: COLONOSCOPY WITH ESOPHAGOGASTRODUODENOSCOPY (EGD) 06/05/2022: HALLUX VALGUS LAPIDUS; Right     Comment:  Procedure: HALLUX VALGUS LAPIDUS;  Surgeon: Tobie Franky SQUIBB, DPM;  Location: [facility] [place];  Service:               Podiatry;  Laterality: Right; 06/05/2022: HAMMER TOE SURGERY; Right     Comment:  Procedure: HAMMER TOE CORRECTION;  Surgeon: Tobie Franky SQUIBB, DPM;  Location: [facility];  Service:               Podiatry;  Laterality: Right; 12/05/2022: REVERSE SHOULDER ARTHROPLASTY; Right     Comment:  Procedure: RIGHT REVERSE SHOULDER ARTHROPLASTY;                Surgeon: Addie Cordella Hamilton, MD;  Location: [facility] OR;                Service: Orthopedics;  Laterality: Right;  04/16/2023: REVERSE SHOULDER ARTHROPLASTY; Left     Comment:  Procedure: REVERSE SHOULDER ARTHROPLASTY;  Surgeon:               Addie Cordella Hamilton, MD;  Location: [facility] OR;  Service:               Orthopedics;  Laterality: Left; 08/12/2020: SHOULDER ARTHROSCOPY WITH ROTATOR CUFF REPAIR AND  SUBACROMIAL DECOMPRESSION; Right     Comment:  Procedure: RIGHT SHOULDER ARTHROSCOPY WITH ROTATOR CUFF               REPAIR AND SUBACROMIAL DECOMPRESSION;  Surgeon: Vernetta Lonni GRADE, MD;  Location: [facility];               Service: Orthopedics;  Laterality: Right; 12/24/2020: TOTAL KNEE ARTHROPLASTY; Right     Comment:  Procedure: RIGHT TOTAL KNEE ARTHROPLASTY;  Surgeon:               Vernetta Lonni GRADE, MD;  Location: [facility];  Service:              Orthopedics;  Laterality: Right; 07/15/2021: TOTAL KNEE ARTHROPLASTY; Left     Comment:  Procedure: LEFT TOTAL KNEE ARTHROPLASTY;  Surgeon:                Vernetta Lonni GRADE, MD;  Location: [facility];  Service:              Orthopedics;  Laterality: Left; 03/12/2023: XI ROBOTIC ASSISTED PARAESOPHAGEAL HERNIA REPAIR; N/A     Comment:  Procedure: XI ROBOTIC ASSISTED PARAESOPHAGEAL HERNIA               REPAIR WITH FUNDOPLICATION;  Surgeon: Signe Mitzie LABOR,               MD;  Location: [facility];  Service: General;  Laterality:               N/A;  180     Reproductive/Obstetrics negative OB ROS                              Anesthesia Physical Anesthesia Plan  ASA: 3  Anesthesia Plan: General   Post-op Pain Management: Regional block*   Induction: Intravenous  PONV Risk Score and Plan: 3 and Dexamethasone , Ondansetron , TIVA, Propofol  infusion and Midazolam   Airway Management Planned: Natural Airway  Additional Equipment:   Intra-op Plan:   Post-operative Plan: Extubation in OR  Informed Consent: I have reviewed the patients History and Physical, chart, labs and discussed the procedure including the risks, benefits and alternatives for the proposed anesthesia with the patient or authorized representative who has indicated his/her understanding and acceptance.     Dental Advisory Given  Plan Discussed with: Anesthesiologist, CRNA and Surgeon  Anesthesia Plan Comments:         Anesthesia Quick Evaluation

## 2024-04-07 NOTE — Interval H&P Note (Signed)
 History and Physical Interval Note:  04/07/2024 12:59 PM  Denise Morales  has presented today for surgery, with the diagnosis of Hav hallux abducto valgus, right M20.11 Hallux valgus, acquired, left M20.12.  The various methods of treatment have been discussed with the patient and family. After consideration of risks, benefits and other options for treatment, the patient has consented to  Procedure(s): BUNIONECTOMY (Bilateral) as a surgical intervention.  The patient's history has been reviewed, patient examined, no change in status, stable for surgery.  I have reviewed the patient's chart and labs.  Questions were answered to the patient's satisfaction.     [name]

## 2024-04-07 NOTE — Discharge Instructions (Signed)
 After Surgery Instructions   1) If you are recuperating from surgery anywhere other than home, please be sure to leave us  the number where you can be reached.  2) Go directly home and rest.  3) Keep the operated foot(feet) elevated six inches above the hip when sitting or lying down. This will help control swelling and pain.  4) Support the elevated foot and leg with pillows. DO NOT PLACE PILLOWS UNDER THE KNEE.  5) DO NOT REMOVE or get your bandages WET, unless you were given different instructions by your doctor to do so. This increases the risk of infection.  6) Wear your surgical shoe or surgical boot at all times when you are up on your feet.  7) A limited amount of pain and swelling may occur. The skin may take on a bruised appearance. DO NOT BE ALARMED, THIS IS NORMAL.  8) For slight pain and swelling, apply an ice pack directly over the bandages for 15 minutes only out of each hour of the day. Continue until seen in the office for your first post op visit. DON NOT     APPLY ANY FORM OF HEAT TO THE AREA.  9) Have prescriptions filled immediately and take as directed.  10) Drink lots of liquids, water and juice to stay hydrated.  11) CALL IMMEDIATELY IF:  *Bleeding continues until the following day of surgery  *Pain increases and/or does not respond to medication  *Bandages or cast appears to tight  *If your bandage gets wet  *Trip, fall or stump your surgical foot  *If your temperature goes above 101  *If you have ANY questions at all  YOU NOW CONTROL THE EFFORT OF YOUR RECOVERY. ADHERING TO THESE INSTRUCTIONS WILL OFFER YOU THE MOST COMPLETE RESULTS

## 2024-04-08 ENCOUNTER — Encounter: Payer: Self-pay | Admitting: Podiatry

## 2024-04-09 ENCOUNTER — Encounter: Admitting: Podiatry

## 2024-04-10 NOTE — Op Note (Signed)
 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 female presents with painful bilateral hallux interphalangeus. Patient has failed all conservative therapy including but not limited to shoe gear modification padding protecting offloading. She wishes to have surgical correction of the foot/deformity. It was determined that patient would benefit from bilateral phalangeal osteotomy. Informed surgical risk consent was reviewed and read aloud to the patient.  I reviewed the films.  I have discussed my findings with the patient in great detail.  I have discussed all risks including but not limited to infection, stiffness, scarring, limp, disability, deformity, damage to blood vessels and nerves, numbness, poor healing, need for braces, arthritis, chronic pain, amputation, death.  All benefits and realistic expectations discussed in great detail.  I have made no promises as to the outcome.  I have provided realistic expectations.  I have offered the patient a 2nd opinion, which they have declined and assured me they preferred to proceed despite the risks   Procedure in detail: The patient was both verbally and visually identified by myself, the nursing staff, and anesthesia staff in the preoperative holding area. They were then transferred to the operating room and placed on the operative table in supine position.  Attention was directed to bilateral hallux incision was made from epidermal  dermal junction down to the level of the bone.  At this time the base of the proximal phalanx was exposed.  A closing base wedge osteotomy was performed in standard technique using a sagittal saw.  Once the osteotomy was complete reduction of the deformity noted.  This was held in place manually and staple was applied in standard technique no complication noted reduction of hallux interphalangeus noted.  This procedure was carried out bilaterally to both hallux.  Incision was closed with 3-0 Monocryl and 3-0 Prolene.  The incision was dressed with Xeroform Kerlix Ace bandage 4 x 4 gauze.  At the conclusion of the procedure the patient was awoken from anesthesia and found to have tolerated the procedure well any complications. There were transferred to PACU with vital signs stable and vascular status intact.  Nimrit Kehres, DPM

## 2024-04-16 ENCOUNTER — Other Ambulatory Visit: Payer: Self-pay | Admitting: Podiatry

## 2024-04-16 ENCOUNTER — Ambulatory Visit (INDEPENDENT_AMBULATORY_CARE_PROVIDER_SITE_OTHER)

## 2024-04-16 ENCOUNTER — Ambulatory Visit: Admitting: Podiatry

## 2024-04-16 DIAGNOSIS — M2012 Hallux valgus (acquired), left foot: Secondary | ICD-10-CM

## 2024-04-16 DIAGNOSIS — M2011 Hallux valgus (acquired), right foot: Secondary | ICD-10-CM | POA: Diagnosis not present

## 2024-04-16 DIAGNOSIS — Z9889 Other specified postprocedural states: Secondary | ICD-10-CM

## 2024-04-16 NOTE — Progress Notes (Signed)
 Subjective:  Patient ID: Denise Morales, female    DOB: 07/22/1960,  MRN: [id number]  Chief Complaint  Patient presents with   Routine Post Op    POV # 1 DOS 04/07/24 BIL PHALANGEAL OSTEOTOMY OF HALLUX W/ FIXATION    DOS: 03/08/2024 Procedure: Bilateral phalangeal osteotomy hallux  63 y.o. female returns for post-op check.  She states she is doing well minimal pain denies any other acute complaints.  No nausea fever chills vomiting bandages clean dry and intact  Review of Systems: Negative except as noted in the HPI. Denies N/V/F/Ch.  Past Medical History:  Diagnosis Date   Allergy    Anemia    Anxiety    Arthritis    Bilateral bunions    Bunion of left foot 07/2023   Depression    Diverticulitis    Essential hypertension    Fibromyalgia    GERD (gastroesophageal reflux disease)    Heart murmur    History of hiatal hernia    Hyperlipidemia    Hypothyroidism    Iron  deficiency anemia    Melanoma (HCC)    face   Pre-diabetes     Current Outpatient Medications:    acetaminophen  (TYLENOL ) 500 MG tablet, Take 1,000 mg by mouth See admin instructions. Take 2 tablets (1000 mg) by mouth scheduled at bedtime & every 6 hours if needed for pain., Disp: , Rfl:    buPROPion  (WELLBUTRIN  XL) 150 MG 24 hr tablet, Take 1 tablet (150 mg total) by mouth in the morning and at bedtime., Disp: 180 tablet, Rfl: 1   busPIRone  (BUSPAR ) 7.5 MG tablet, Take 1 tablet (7.5 mg total) by mouth 2 (two) times daily., Disp: 180 tablet, Rfl: 1   celecoxib  (CELEBREX ) 100 MG capsule, Take 1 capsule (100 mg total) by mouth 2 (two) times daily., Disp: 180 capsule, Rfl: 1   dicyclomine  (BENTYL ) 10 MG capsule, TAKE 1 CAPSULE BY MOUTH TWICE DAILY AS NEEDED FOR  ABDOMINAL  PAIN, Disp: 180 capsule, Rfl: 0   gabapentin  (NEURONTIN ) 300 MG capsule, Take 1 capsule (300 mg total) by mouth 3 (three) times daily., Disp: 90 capsule, Rfl: 3   ibuprofen  (ADVIL ) 800 MG tablet, Take 1 tablet (800 mg total) by mouth every 6  (six) hours as needed., Disp: 60 tablet, Rfl: 1   Krill Oil 500 MG CAPS, Take 500 mg by mouth in the morning., Disp: , Rfl:    levothyroxine  (SYNTHROID ) 50 MCG tablet, Take 1 tablet by mouth once daily, Disp: 90 tablet, Rfl: 0   lisinopril -hydrochlorothiazide  (ZESTORETIC ) 20-12.5 MG tablet, Take 1 tablet by mouth daily., Disp: 90 tablet, Rfl: 3   oxyCODONE -acetaminophen  (PERCOCET) 5-325 MG tablet, Take 1 tablet by mouth every 4 (four) hours as needed for severe pain (pain score 7-10)., Disp: 30 tablet, Rfl: 0   pravastatin  (PRAVACHOL ) 40 MG tablet, Take 1 tablet by mouth once daily, Disp: 90 tablet, Rfl: 0   TURMERIC PO, Take 1,500 mg by mouth in the morning., Disp: , Rfl:    Vitamin D -Vitamin K (VITAMIN K2 -VITAMIN D3 PO), Take 1 tablet by mouth in the morning., Disp: , Rfl:   Social History   Tobacco Use  Smoking Status Never   Passive exposure: Past  Smokeless Tobacco Never    Allergies  Allergen Reactions   Hydroxyzine  Other (See Comments)    hallucinations   Objective:  There were no vitals filed for this visit. There is no height or weight on file to calculate BMI. Constitutional Well developed. Well nourished.  Vascular Foot warm and well perfused. Capillary refill normal to all digits.   Neurologic Normal speech. Oriented to person, place, and time. Epicritic sensation to light touch grossly present bilaterally.  Dermatologic Skin healing well without signs of infection. Skin edges well coapted without signs of infection.  Orthopedic: Tenderness to palpation noted about the surgical site.   Radiographs: 3 views of skeletally mature adult bilateral foot: Phalangeal osteotomy noted reduction of hallux interphalangeus angle noted no other abnormalities identified Assessment:   1. Hallux valgus, right   2. Hallux valgus, left    Plan:  Patient was evaluated and treated and all questions answered.  S/p foot surgery bilaterally -Progressing as expected  post-operatively. -XR: See above -WB Status: Weightbearing as tolerated in surgical shoe -Sutures: Intact.  No clinical signs of dehiscence noted no complication noted -Medications: None -Foot redressed.  No follow-ups on file.

## 2024-04-23 ENCOUNTER — Telehealth: Payer: Self-pay | Admitting: Pharmacy Technician

## 2024-04-23 ENCOUNTER — Other Ambulatory Visit (HOSPITAL_COMMUNITY): Payer: Self-pay

## 2024-04-23 ENCOUNTER — Telehealth: Payer: Self-pay

## 2024-04-23 NOTE — Telephone Encounter (Signed)
 Pharmacy Patient Advocate Encounter   Received notification from Pt Calls Messages that prior authorization for Dicyclomine  HCl 10MG  capsules is required/requested.   Insurance verification completed.   The patient is insured through [facility].   Per test claim: PA required; PA started via CoverMyMeds. KEY [id number] . Waiting for clinical questions to populate.

## 2024-04-23 NOTE — Telephone Encounter (Signed)
 Copied from CRM [id number]. Topic: Clinical - Medication Prior Auth >> Apr 23, 2024 10:16 AM Macario HERO wrote: Reason for CRM: Amber from [employer] called regarding prior authorization for dicyclomine  (BENTYL ) 10 MG capsule [[id number]]. Requesting a call back.  Phone: [phone] Fax: [phone]

## 2024-04-23 NOTE — Telephone Encounter (Signed)
 Pharmacy Patient Advocate Encounter  Received notification from [facility] MEDICARE that Prior Authorization for Dicyclomine  HCl 10MG  capsules has been CANCELLED due to   **I'm not sure who started the PA but when I went to submit a PA today the above message came from her plan and will not allow me to submit a PA**   PA #/Case ID/Reference #: [id number]

## 2024-04-23 NOTE — Telephone Encounter (Signed)
 PA request has been Started. New Encounter has been or will be created for follow up. For additional info see Pharmacy Prior Auth telephone encounter from 04/23/24.

## 2024-04-27 NOTE — Progress Notes (Unsigned)
 Chief Complaint: Primary GI MD: Dr. Stacia  HPI: 63 year old female with past medical history significant for hypertension, hyperlipidemia, thyroid  disease, depression, melanoma, GERD, s/p cholecystectomy, presents for evaluation of abnormal CT scan  Patient seen in ED 02/12/2024 for LLQ pain CT abdomen pelvis with contrast showed sigmoid diverticulitis.  Incidentally also showed intra and extrahepatic biliary ductal dilation with CBD measuring 17 mm   Discussed the use of AI scribe software for clinical note transcription with the patient, who gave verbal consent to proceed.  History of Present Illness      PREVIOUS GI WORKUP   EGD 01/09/2023 for iron  deficiency anemia - Benign- appearing esophageal stenosis. Dilated.  - 6 cm paraesophageal hernia. This appears to have progressed in size since last year  - Normal examined duodenum. Biopsied.  - Biopsies were taken with a cold forceps for Helicobacter pylori testing.  - No endoscopic findings to explain iron  deficiency anemia, although presence of paraesophageal hernia raises likelihood of Ole' s ulcers as potential source. -Biopsies negative for H. pylori and celiac  Colonoscopy 01/09/2023 for iron  deficiency anemia - Diverticulosis in the sigmoid colon and in the descending colon.  - Polypoid mucosa at the appendiceal orifice. Biopsied to exclude dysplasia.  (Normal biopsies) - The examined portion of the ileum was normal.  - The distal rectum and anal verge are normal on retroflexion view.  - No abnormalities to explain iron  deficiency anemia - Repeat 7 years   05/30/2021 colonoscopy with Dr. Stacia for screening purposes-good bowel prep, Suprep.  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   Past Medical History:  Diagnosis Date   Allergy    Anemia    Anxiety    Arthritis    Bilateral bunions    Bunion of left  foot 07/2023   Depression    Diverticulitis    Essential hypertension    Fibromyalgia    GERD (gastroesophageal reflux disease)    Heart murmur    History of hiatal hernia    Hyperlipidemia    Hypothyroidism    Iron  deficiency anemia    Melanoma (HCC)    face   Pre-diabetes     Past Surgical History:  Procedure Laterality Date   ABDOMINAL HYSTERECTOMY  1985   AIKEN OSTEOTOMY Right 06/05/2022   Procedure: KATRINA OSTEOTOMY;  Surgeon: Tobie Franky SQUIBB, DPM;  Location: [facility] [place];  Service: Podiatry;  Laterality: Right;   BICEPT TENODESIS Right 12/05/2022   Procedure: RIGHT BICEPS TENODESIS;  Surgeon: Addie Cordella Hamilton, MD;  Location: [facility] OR;  Service: Orthopedics;  Laterality: Right;   BUNIONECTOMY Bilateral 04/07/2024   Procedure: BUNIONECTOMY;  Surgeon: Tobie Franky SQUIBB, DPM;  Location: [facility];  Service: Orthopedics/Podiatry;  Laterality: Bilateral;   CAPSULOTOMY METATARSOPHALANGEAL Right 06/05/2022   Procedure: CAPSULOTOMY METATARSOPHALANGEAL;  Surgeon: Tobie Franky SQUIBB, DPM;  Location: [facility] [place];  Service: Podiatry;  Laterality: Right;   CHOLECYSTECTOMY     COLONOSCOPY     COLONOSCOPY WITH ESOPHAGOGASTRODUODENOSCOPY (EGD)  12/2022   HALLUX VALGUS LAPIDUS Right 06/05/2022   Procedure: HALLUX VALGUS LAPIDUS;  Surgeon: Tobie Franky SQUIBB, DPM;  Location: [facility];  Service: Podiatry;  Laterality: Right;   HALLUX VALGUS LAPIDUS Left 09/03/2023   Procedure: ROMAYNE LOOK;  Surgeon: Tobie Franky SQUIBB, DPM;  Location: [facility];  Service: Orthopedics/Podiatry;  Laterality: Left;  POPLITEAL BLOCK   HAMMER TOE SURGERY Right 06/05/2022   Procedure: HAMMER  TOE CORRECTION;  Surgeon: Tobie Franky SQUIBB, DPM;  Location: [facility] [place];  Service: Podiatry;  Laterality: Right;   HERNIA REPAIR  2025   JOINT REPLACEMENT  2022 - 2024   REVERSE SHOULDER ARTHROPLASTY Right 12/05/2022   Procedure: RIGHT REVERSE SHOULDER ARTHROPLASTY;  Surgeon:  Addie Cordella Hamilton, MD;  Location: [facility] OR;  Service: Orthopedics;  Laterality: Right;   REVERSE SHOULDER ARTHROPLASTY Left 04/16/2023   Procedure: REVERSE SHOULDER ARTHROPLASTY;  Surgeon: Addie Cordella Hamilton, MD;  Location: [facility] OR;  Service: Orthopedics;  Laterality: Left;   SHOULDER ARTHROSCOPY WITH ROTATOR CUFF REPAIR AND SUBACROMIAL DECOMPRESSION Right 08/12/2020   Procedure: RIGHT SHOULDER ARTHROSCOPY WITH ROTATOR CUFF REPAIR AND SUBACROMIAL DECOMPRESSION;  Surgeon: Vernetta Lonni GRADE, MD;  Location: [facility];  Service: Orthopedics;  Laterality: Right;   STERIOD INJECTION Left 09/03/2023   Procedure: STEROID INJECTION;  Surgeon: Tobie Franky SQUIBB, DPM;  Location: [facility];  Service: Orthopedics/Podiatry;  Laterality: Left;   TOTAL KNEE ARTHROPLASTY Right 12/24/2020   Procedure: RIGHT TOTAL KNEE ARTHROPLASTY;  Surgeon: Vernetta Lonni GRADE, MD;  Location: [facility];  Service: Orthopedics;  Laterality: Right;   TOTAL KNEE ARTHROPLASTY Left 07/15/2021   Procedure: LEFT TOTAL KNEE ARTHROPLASTY;  Surgeon: Vernetta Lonni GRADE, MD;  Location: [facility];  Service: Orthopedics;  Laterality: Left;   TUBAL LIGATION  1987   XI ROBOTIC ASSISTED PARAESOPHAGEAL HERNIA REPAIR N/A 03/12/2023   Procedure: XI ROBOTIC ASSISTED PARAESOPHAGEAL HERNIA REPAIR WITH FUNDOPLICATION;  Surgeon: Signe Mitzie LABOR, MD;  Location: [facility];  Service: General;  Laterality: N/A;  180    Current Outpatient Medications  Medication Sig Dispense Refill   acetaminophen  (TYLENOL ) 500 MG tablet Take 1,000 mg by mouth See admin instructions. Take 2 tablets (1000 mg) by mouth scheduled at bedtime & every 6 hours if needed for pain.     buPROPion  (WELLBUTRIN  XL) 150 MG 24 hr tablet Take 1 tablet (150 mg total) by mouth in the morning and at bedtime. 180 tablet 1   busPIRone  (BUSPAR ) 7.5 MG tablet Take 1 tablet (7.5 mg total) by mouth 2 (two) times daily. 180 tablet 1   celecoxib  (CELEBREX ) 100 MG capsule Take 1  capsule (100 mg total) by mouth 2 (two) times daily. 180 capsule 1   dicyclomine  (BENTYL ) 10 MG capsule TAKE 1 CAPSULE BY MOUTH TWICE DAILY AS NEEDED FOR  ABDOMINAL  PAIN 180 capsule 0   gabapentin  (NEURONTIN ) 300 MG capsule Take 1 capsule (300 mg total) by mouth 3 (three) times daily. 90 capsule 3   ibuprofen  (ADVIL ) 800 MG tablet Take 1 tablet (800 mg total) by mouth every 6 (six) hours as needed. 60 tablet 1   Krill Oil 500 MG CAPS Take 500 mg by mouth in the morning.     levothyroxine  (SYNTHROID ) 50 MCG tablet Take 1 tablet by mouth once daily 90 tablet 0   lisinopril -hydrochlorothiazide  (ZESTORETIC ) 20-12.5 MG tablet Take 1 tablet by mouth daily. 90 tablet 3   oxyCODONE -acetaminophen  (PERCOCET) 5-325 MG tablet Take 1 tablet by mouth every 4 (four) hours as needed for severe pain (pain score 7-10). 30 tablet 0   pravastatin  (PRAVACHOL ) 40 MG tablet Take 1 tablet by mouth once daily 90 tablet 0   TURMERIC PO Take 1,500 mg by mouth in the morning.     Vitamin D -Vitamin K (VITAMIN K2 -VITAMIN D3 PO) Take 1 tablet by mouth in the morning.     No current facility-administered medications for this visit.    Allergies as of  04/29/2024 - Review Complete 04/16/2024  Allergen Reaction Noted   Hydroxyzine  Other (See Comments) 10/17/2021    Family History  Problem Relation Age of Onset   Dementia Mother    Heart Problems Mother    Stroke Mother    Hypertension Mother    Anxiety disorder Mother    Arthritis Mother    Depression Mother    Hypertension Father    Colon polyps Father    Diabetes Father    Cancer Father    Heart disease Father    Kidney disease Father    Hypertension Sister    Diabetes Sister        borderline   Fibromyalgia Sister    Anxiety disorder Sister    Arthritis Sister    COPD Sister    Depression Sister    Heart disease Sister    Kidney disease Sister    Hypertension Sister    Colon polyps Sister    Diabetes Sister    Fibromyalgia Sister    Heart Problems  Sister    Arthritis Sister    Anxiety disorder Sister    COPD Sister    Depression Sister    Heart disease Sister    Hypertension Sister    Heart Problems Sister    Anxiety disorder Sister    ADD / ADHD Daughter    Diabetes Paternal Aunt    Colon cancer Neg Hx    Esophageal cancer Neg Hx    Rectal cancer Neg Hx    Stomach cancer Neg Hx    Breast cancer Neg Hx     Social History   Socioeconomic History   Marital status: Married    Spouse name: Darina   Number of children: 1   Years of education: Not on file   Highest education level: 12th grade  Occupational History   Not on file  Tobacco Use   Smoking status: Never    Passive exposure: Past   Smokeless tobacco: Never  Vaping Use   Vaping status: Never Used  Substance and Sexual Activity   Alcohol use: Never   Drug use: Never   Sexual activity: Not Currently    Birth control/protection: Surgical    Comment: Hysterectomy  Other Topics Concern   Not on file  Social History Narrative   Not on file   Social Drivers of Health   Financial Resource Strain: Low Risk  (12/06/2023)   Overall Financial Resource Strain (CARDIA)    Difficulty of Paying Living Expenses: Not hard at all  Food Insecurity: No Food Insecurity (12/06/2023)   Hunger Vital Sign    Worried About Running Out of Food in the Last Year: Never true    Ran Out of Food in the Last Year: Never true  Transportation Needs: No Transportation Needs (12/06/2023)   PRAPARE - Administrator, Civil Service (Medical): No    Lack of Transportation (Non-Medical): No  Physical Activity: Inactive (12/06/2023)   Exercise Vital Sign    Days of Exercise per Week: 1 day    Minutes of Exercise per Session: 0 min  Stress: No Stress Concern Present (12/06/2023)   [employer] of Occupational Health - Occupational Stress Questionnaire    Feeling of Stress: Only a little  Social Connections: Moderately Isolated (12/06/2023)   Social Connection and Isolation  Panel    Frequency of Communication with Friends and Family: Three times a week    Frequency of Social Gatherings with Friends and Family: Once a week  Attends Religious Services: Never    Active Member of Clubs or Organizations: No    Attends Banker Meetings: Not on file    Marital Status: Married  Intimate Partner Violence: Not At Risk (03/12/2023)   Humiliation, Afraid, Rape, and Kick questionnaire    Fear of Current or Ex-Partner: No    Emotionally Abused: No    Physically Abused: No    Sexually Abused: No    Review of Systems:    Constitutional: No weight loss, fever, chills, weakness or fatigue HEENT: Eyes: No change in vision               Ears, Nose, Throat:  No change in hearing or congestion Skin: No rash or itching Cardiovascular: No chest pain, chest pressure or palpitations   Respiratory: No SOB or cough Gastrointestinal: See HPI and otherwise negative Genitourinary: No dysuria or change in urinary frequency Neurological: No headache, dizziness or syncope Musculoskeletal: No new muscle or joint pain Hematologic: No bleeding or bruising Psychiatric: No history of depression or anxiety    Physical Exam:  Vital signs: There were no vitals taken for this visit.  Constitutional: NAD, alert and cooperative Head:  Normocephalic and atraumatic. Eyes:   PEERL, EOMI. No icterus. Conjunctiva pink. Respiratory: Respirations even and unlabored. Lungs clear to auscultation bilaterally.   No wheezes, crackles, or rhonchi.  Cardiovascular:  Regular rate and rhythm. No peripheral edema, cyanosis or pallor.  Gastrointestinal:  Soft, nondistended, nontender. No rebound or guarding. Normal bowel sounds. No appreciable masses or hepatomegaly. Rectal:  Declines Msk:  Symmetrical without gross deformities. Without edema, no deformity or joint abnormality.  Neurologic:  Alert and  oriented x4;  grossly normal neurologically.  Skin:   Dry and intact without significant  lesions or rashes. Psychiatric: Oriented to person, place and time. Demonstrates good judgement and reason without abnormal affect or behaviors.  Physical Exam    RELEVANT LABS AND IMAGING: CBC    Component Value Date/Time   WBC 6.9 03/11/2024 1115   RBC 4.29 03/11/2024 1115   HGB 12.6 03/11/2024 1115   HGB 14.0 01/01/2020 1453   HCT 38.6 03/11/2024 1115   HCT 40.5 01/01/2020 1453   PLT 329 03/11/2024 1115   PLT 369 01/01/2020 1453   MCV 90.0 03/11/2024 1115   MCV 84 01/01/2020 1453   MCH 29.4 03/11/2024 1115   MCHC 32.6 03/11/2024 1115   RDW 13.6 03/11/2024 1115   RDW 13.2 01/01/2020 1453   LYMPHSABS 1.6 02/22/2024 0931   LYMPHSABS 2.9 01/01/2020 1453   MONOABS 0.8 02/22/2024 0931   EOSABS 221 03/11/2024 1115   EOSABS 0.3 01/01/2020 1453   BASOSABS 83 03/11/2024 1115   BASOSABS 0.1 01/01/2020 1453    CMP     Component Value Date/Time   NA 141 03/11/2024 1115   NA 141 01/01/2020 1453   K 4.1 03/11/2024 1115   CL 104 03/11/2024 1115   CO2 25 03/11/2024 1115   GLUCOSE 115 (H) 03/11/2024 1115   BUN 17 03/11/2024 1115   BUN 24 01/01/2020 1453   CREATININE 1.07 (H) 03/11/2024 1115   CALCIUM 9.4 03/11/2024 1115   PROT 6.7 03/11/2024 1115   PROT 6.9 01/01/2020 1453   ALBUMIN  4.2 02/22/2024 0931   ALBUMIN  4.6 01/01/2020 1453   AST 13 03/11/2024 1115   ALT 9 03/11/2024 1115   ALKPHOS 130 (H) 02/22/2024 0931   BILITOT 0.4 03/11/2024 1115   BILITOT 0.3 01/01/2020 1453   GFRNONAA 49 (L) 02/22/2024  9068   GFRAA 60 01/01/2020 1453     Assessment/Plan:   Assessment and Plan Assessment & Plan    Biliary ductal dilation CTAP 02/12/2024 showed intrahepatic and extrahepatic biliary ductal dilation with CBD measuring 17 mm.  S/p cholecystectomy. At the time of imaging AST 46, otherwise normal LFTs.  Recent labs 03/11/2024 with normal LFTs and no leukocytosis. - MRCP to evaluate dilated CBD - Recheck liver enzymes today  History of diverticulitis CTAP 02/12/2024  with sigmoid diverticulitis treated with antibiotics with resolution of symptoms. Colonoscopy 12/2022 with diverticulosis, otherwise normal with repeat 7 years - No reason to repeat colonoscopy at this time as it has been within the last year. - Educated patient on diverticulitis/diverticulosis and recommended high-fiber diet - Provided patient education handout  Large paraesophageal hernia 6 cm paraesophageal hernia on EGD 12/2022  History of stricture s/p dilation EGD with stricture s/p dilation 12/2022.  Dilation with 18-19-20 mm balloon dilator performed to 20 mm  Ivianna Notch Mollie RIGGERS [facility] 04/27/2024, 12:55 PM  Cc: Kayla Jeoffrey RAMAN, FNP

## 2024-04-28 ENCOUNTER — Encounter: Payer: Self-pay | Admitting: Radiology

## 2024-04-28 ENCOUNTER — Telehealth: Payer: Self-pay

## 2024-04-28 ENCOUNTER — Other Ambulatory Visit (HOSPITAL_COMMUNITY): Payer: Self-pay

## 2024-04-28 NOTE — Telephone Encounter (Signed)
 Copied from CRM [id number]. Topic: Clinical - Medication Prior Auth >> Apr 28, 2024  4:18 PM Delon HERO wrote: Reason for CRM: Patient is calling to request a PA for dicyclomine  (BENTYL ) 10 MG capsule [[id number]] Not on formulary list. Please advise

## 2024-04-28 NOTE — Telephone Encounter (Signed)
 PA request has been Received. New Encounter has been or will be created for follow up. For additional info see Pharmacy Prior Auth telephone encounter from 04/28/24.

## 2024-04-28 NOTE — Telephone Encounter (Signed)
 Copied from CRM [id number]. Topic: Clinical - Medication Prior Auth >> Apr 23, 2024 10:16 AM Macario HERO wrote: Reason for CRM: Amber from [facility] [employer] called regarding prior authorization for dicyclomine  (BENTYL ) 10 MG capsule [[id number]]. Requesting a call back.  Phone: [phone] Fax: [phone] >> Apr 28, 2024 10:35 AM Corin V wrote: Patient is calling to follow up on this prior authorization request. She stated insurance is stating they did not receive information back that was needed. Insurance provided the number [phone] and stated that calling them directly there would be the quickest way to resolve this issue. Please call patient back before end of day with an update. Her callback: [phone]

## 2024-04-28 NOTE — Telephone Encounter (Signed)
 Good afternoon Denise Morales, I'm showing that she is too soon for a refill.  I called [facility] and spoke with Yaw who confirmed that her dicyclomine  was last filled on 04/13/2024 and she paid $5.00 and her next refill is due on 05/06/24.

## 2024-04-29 ENCOUNTER — Ambulatory Visit (INDEPENDENT_AMBULATORY_CARE_PROVIDER_SITE_OTHER): Admitting: Gastroenterology

## 2024-04-29 ENCOUNTER — Other Ambulatory Visit (INDEPENDENT_AMBULATORY_CARE_PROVIDER_SITE_OTHER)

## 2024-04-29 ENCOUNTER — Encounter: Payer: Self-pay | Admitting: Gastroenterology

## 2024-04-29 VITALS — BP 126/74 | HR 84 | Ht <= 58 in | Wt 166.5 lb

## 2024-04-29 DIAGNOSIS — K529 Noninfective gastroenteritis and colitis, unspecified: Secondary | ICD-10-CM

## 2024-04-29 DIAGNOSIS — Z8719 Personal history of other diseases of the digestive system: Secondary | ICD-10-CM | POA: Diagnosis not present

## 2024-04-29 DIAGNOSIS — K838 Other specified diseases of biliary tract: Secondary | ICD-10-CM | POA: Diagnosis not present

## 2024-04-29 DIAGNOSIS — K222 Esophageal obstruction: Secondary | ICD-10-CM

## 2024-04-29 DIAGNOSIS — K449 Diaphragmatic hernia without obstruction or gangrene: Secondary | ICD-10-CM | POA: Diagnosis not present

## 2024-04-29 LAB — CBC WITH DIFFERENTIAL/PLATELET
Basophils Absolute: 0.1 K/uL (ref 0.0–0.1)
Basophils Relative: 1.2 % (ref 0.0–3.0)
Eosinophils Absolute: 0.3 K/uL (ref 0.0–0.7)
Eosinophils Relative: 3.9 % (ref 0.0–5.0)
HCT: 41.8 % (ref 36.0–46.0)
Hemoglobin: 14 g/dL (ref 12.0–15.0)
Lymphocytes Relative: 33.8 % (ref 12.0–46.0)
Lymphs Abs: 2.9 K/uL (ref 0.7–4.0)
MCHC: 33.6 g/dL (ref 30.0–36.0)
MCV: 88.2 fl (ref 78.0–100.0)
Monocytes Absolute: 0.6 K/uL (ref 0.1–1.0)
Monocytes Relative: 6.6 % (ref 3.0–12.0)
Neutro Abs: 4.6 K/uL (ref 1.4–7.7)
Neutrophils Relative %: 54.5 % (ref 43.0–77.0)
Platelets: 341 K/uL (ref 150.0–400.0)
RBC: 4.74 Mil/uL (ref 3.87–5.11)
RDW: 14.7 % (ref 11.5–15.5)
WBC: 8.5 K/uL (ref 4.0–10.5)

## 2024-04-29 LAB — COMPREHENSIVE METABOLIC PANEL WITH GFR
ALT: 11 U/L (ref 0–35)
AST: 12 U/L (ref 0–37)
Albumin: 4.6 g/dL (ref 3.5–5.2)
Alkaline Phosphatase: 96 U/L (ref 39–117)
BUN: 18 mg/dL (ref 6–23)
CO2: 28 meq/L (ref 19–32)
Calcium: 9.8 mg/dL (ref 8.4–10.5)
Chloride: 101 meq/L (ref 96–112)
Creatinine, Ser: 1.24 mg/dL — ABNORMAL HIGH (ref 0.40–1.20)
GFR: 45.8 mL/min — ABNORMAL LOW
Glucose, Bld: 93 mg/dL (ref 70–99)
Potassium: 4 meq/L (ref 3.5–5.1)
Sodium: 139 meq/L (ref 135–145)
Total Bilirubin: 0.4 mg/dL (ref 0.2–1.2)
Total Protein: 7.6 g/dL (ref 6.0–8.3)

## 2024-04-29 NOTE — Patient Instructions (Signed)
 _______________________________________________________  If your blood pressure at your visit was 140/90 or greater, please contact your primary care physician to follow up on this.  _______________________________________________________  If you are age 65 or older, your body mass index should be between 23-30. Your Body mass index is 34.8 kg/m. If this is out of the aforementioned range listed, please consider follow up with your Primary Care Provider.  If you are age 35 or younger, your body mass index should be between 19-25. Your Body mass index is 34.8 kg/m. If this is out of the aformentioned range listed, please consider follow up with your Primary Care Provider.   ________________________________________________________  The [facility] providers would like to encourage you to use MYCHART to communicate with providers for non-urgent requests or questions.  Due to long hold times on the telephone, sending your provider a message by [facility] may be a faster and more efficient way to get a response.  Please allow 48 business hours for a response.  Please remember that this is for non-urgent requests.  _______________________________________________________  [facility] is using a team-based approach to care.  Your team is made up of your doctor and two to three APPS. Our APPS (Nurse Practitioners and Physician Assistants) work with your physician to ensure care continuity for you. They are fully qualified to address your health concerns and develop a treatment plan. They communicate directly with your gastroenterologist to care for you. Seeing the Advanced Practice Practitioners on your physician's team can help you by facilitating care more promptly, often allowing for earlier appointments, access to diagnostic testing, procedures, and other specialty referrals.    VISIT SUMMARY:  Today, we discussed your chronic diarrhea and the dilated biliary duct that was found during your  recent emergency department visit. We also reviewed your history of diverticulitis and your previous surgeries. We have made some changes to your treatment plan to help manage your symptoms and prevent future complications.  YOUR PLAN:  -CHRONIC DIARRHEA: Chronic diarrhea means having frequent, loose, or watery bowel movements over a long period. We have stopped your current medication, dicyclomine , as it was not effective. Instead, you will start taking Benefiber, one tablespoon daily for two weeks. If there is no improvement, we may consider another medication called colestipol, but it can cause constipation.  -DILATION OF COMMON BILE DUCT STATUS POST CHOLECYSTECTOMY: A dilated common bile duct means that the duct that carries bile from your liver to your small intestine is wider than normal. This can happen after gallbladder removal surgery or due to stones. We have ordered an MRI to get a better look at your bile duct and checked your liver enzymes today to ensure they are normal.  -HISTORY OF DIVERTICULITIS: Diverticulitis is an inflammation or infection of small pouches that can form in your intestines. You have had this condition before, which led to hospitalization. To prevent it from happening again, continue taking fiber supplements. Fiber helps prevent stool buildup and infection. If you notice any symptoms returning, please contact the clinic as you may need antibiotics.  INSTRUCTIONS:  Please follow up with the clinic if your symptoms of diarrhea do not improve with Benefiber or if you experience any new or worsening symptoms. We will contact you with the results of your MRI and liver enzyme tests. Continue with your fiber supplements and reach out if you have any signs of diverticulitis.  Your provider has requested that you go to the basement level for lab work before leaving today. Press B  on the elevator. The lab is located at the first door on the left as you exit the  elevator.  You have been scheduled for an MRI at 05-07-24 on 8:00 am. Your appointment time is 8:30am. Please arrive to admitting (at main entrance of the hospital) 30 minutes prior to your appointment time for registration purposes. Please make certain not to have anything to eat or drink 6 hours prior to your test. In addition, if you have any metal in your body, have a pacemaker or defibrillator, please be sure to let your ordering physician know. This test typically takes 45 minutes to 1 hour to complete. Should you need to reschedule, please call [phone] to do so.  Due to recent changes in healthcare laws, you may see the results of your imaging and laboratory studies on MyChart before your provider has had a chance to review them.  We understand that in some cases there may be results that are confusing or concerning to you. Not all laboratory results come back in the same time frame and the provider may be waiting for multiple results in order to interpret others.  Please give us  48 hours in order for your provider to thoroughly review all the results before contacting the office for clarification of your results.   Thank you for entrusting me with your care and choosing [facility].  Nestor Blower, PA-C

## 2024-04-30 ENCOUNTER — Other Ambulatory Visit: Payer: Self-pay

## 2024-04-30 ENCOUNTER — Ambulatory Visit (INDEPENDENT_AMBULATORY_CARE_PROVIDER_SITE_OTHER): Admitting: Podiatry

## 2024-04-30 DIAGNOSIS — M2011 Hallux valgus (acquired), right foot: Secondary | ICD-10-CM

## 2024-04-30 DIAGNOSIS — Z9889 Other specified postprocedural states: Secondary | ICD-10-CM

## 2024-04-30 DIAGNOSIS — M2012 Hallux valgus (acquired), left foot: Secondary | ICD-10-CM

## 2024-04-30 NOTE — Progress Notes (Signed)
 Subjective:  Patient ID: Denise Morales, female    DOB: 1961-06-14,  MRN: [id number]  Chief Complaint  Patient presents with   Routine Post Op    POV # 2 DOS 04/07/24 BIL PHALANGEAL OSTEOTOMY OF HALLUX W/ FIXATION    DOS: 03/08/2024 Procedure: Bilateral phalangeal osteotomy hallux  63 y.o. female returns for post-op check.  She states she is doing well minimal pain denies any other acute complaints.  No nausea fever chills vomiting bandages clean dry and intact  Review of Systems: Negative except as noted in the HPI. Denies N/V/F/Ch.  Past Medical History:  Diagnosis Date   Allergy    Anemia    Anxiety    Arthritis    Bilateral bunions    Bunion of left foot 07/2023   Depression    Diverticulitis    Essential hypertension    Fibromyalgia    GERD (gastroesophageal reflux disease)    Heart murmur    History of hiatal hernia    Hyperlipidemia    Hypothyroidism    Iron  deficiency anemia    Melanoma (HCC)    face   Pre-diabetes     Current Outpatient Medications:    acetaminophen  (TYLENOL ) 500 MG tablet, Take 1,000 mg by mouth See admin instructions. Take 2 tablets (1000 mg) by mouth scheduled at bedtime & every 6 hours if needed for pain., Disp: , Rfl:    buPROPion  (WELLBUTRIN  XL) 150 MG 24 hr tablet, Take 1 tablet (150 mg total) by mouth in the morning and at bedtime., Disp: 180 tablet, Rfl: 1   busPIRone  (BUSPAR ) 7.5 MG tablet, Take 1 tablet (7.5 mg total) by mouth 2 (two) times daily., Disp: 180 tablet, Rfl: 1   celecoxib  (CELEBREX ) 100 MG capsule, Take 1 capsule (100 mg total) by mouth 2 (two) times daily., Disp: 180 capsule, Rfl: 1   gabapentin  (NEURONTIN ) 300 MG capsule, Take 1 capsule (300 mg total) by mouth 3 (three) times daily., Disp: 90 capsule, Rfl: 3   Krill Oil 500 MG CAPS, Take 500 mg by mouth in the morning., Disp: , Rfl:    levothyroxine  (SYNTHROID ) 50 MCG tablet, Take 1 tablet by mouth once daily, Disp: 90 tablet, Rfl: 0   lisinopril -hydrochlorothiazide   (ZESTORETIC ) 20-12.5 MG tablet, Take 1 tablet by mouth daily., Disp: 90 tablet, Rfl: 3   pravastatin  (PRAVACHOL ) 40 MG tablet, Take 1 tablet by mouth once daily, Disp: 90 tablet, Rfl: 0   TURMERIC PO, Take 1,500 mg by mouth in the morning., Disp: , Rfl:    Vitamin D -Vitamin K (VITAMIN K2 -VITAMIN D3 PO), Take 1 tablet by mouth in the morning., Disp: , Rfl:   Social History   Tobacco Use  Smoking Status Never   Passive exposure: Past  Smokeless Tobacco Never    Allergies  Allergen Reactions   Hydroxyzine  Other (See Comments)    hallucinations   Objective:  There were no vitals filed for this visit. There is no height or weight on file to calculate BMI. Constitutional Well developed. Well nourished.  Vascular Foot warm and well perfused. Capillary refill normal to all digits.   Neurologic Normal speech. Oriented to person, place, and time. Epicritic sensation to light touch grossly present bilaterally.  Dermatologic Skin completely epithelialized no signs of dehiscence no complication noted reduction of deformity noted.  Orthopedic: Tenderness to palpation noted about the surgical site.   Radiographs: 3 views of skeletally mature adult bilateral foot: Phalangeal osteotomy noted reduction of hallux interphalangeus angle noted no other abnormalities identified  Assessment:   No diagnosis found.  Plan:  Patient was evaluated and treated and all questions answered.  S/p foot surgery bilaterally - Clinically healed and officially discharged from my care if any foot and ankle issues on future she will come back and see me.  At this time she will return to regular shoes regular activities if any foot and ankle happens she will come see me.  No follow-ups on file.

## 2024-05-02 ENCOUNTER — Ambulatory Visit: Payer: Self-pay | Admitting: Gastroenterology

## 2024-05-07 ENCOUNTER — Other Ambulatory Visit: Payer: Self-pay | Admitting: Gastroenterology

## 2024-05-07 ENCOUNTER — Ambulatory Visit (HOSPITAL_COMMUNITY)
Admission: RE | Admit: 2024-05-07 | Discharge: 2024-05-07 | Disposition: A | Discharge status: Home / Self Care | Source: Ambulatory Visit | Attending: Gastroenterology | Admitting: Gastroenterology

## 2024-05-07 DIAGNOSIS — K838 Other specified diseases of biliary tract: Secondary | ICD-10-CM | POA: Diagnosis not present

## 2024-05-07 DIAGNOSIS — R109 Unspecified abdominal pain: Secondary | ICD-10-CM | POA: Diagnosis not present

## 2024-05-07 DIAGNOSIS — K449 Diaphragmatic hernia without obstruction or gangrene: Secondary | ICD-10-CM | POA: Diagnosis not present

## 2024-05-07 DIAGNOSIS — K760 Fatty (change of) liver, not elsewhere classified: Secondary | ICD-10-CM | POA: Diagnosis not present

## 2024-05-07 MED ORDER — GADOBUTROL 1 MMOL/ML IV SOLN
7.0000 mL | Freq: Once | INTRAVENOUS | Status: AC | PRN
  Administered 2024-05-07: 7 mL via INTRAVENOUS

## 2024-05-07 NOTE — Progress Notes (Signed)
 Agree with the assessment and plan as outlined by Nestor Blower, PA-C.  Biliary dilation was not noted on CT in 2024 and so very unlikely to be attributed to cholecystectomy status.  If MRCP negative, may warrant EUS to more definitively rule out mass lesion. Will await MRCP results.

## 2024-05-08 ENCOUNTER — Other Ambulatory Visit: Payer: Self-pay

## 2024-05-08 DIAGNOSIS — K838 Other specified diseases of biliary tract: Secondary | ICD-10-CM

## 2024-05-08 NOTE — Telephone Encounter (Signed)
 EUS has been set up for 06/24/24 at 315 pm at [facility] with GM

## 2024-05-14 ENCOUNTER — Other Ambulatory Visit: Payer: Self-pay | Admitting: Family Medicine

## 2024-05-15 ENCOUNTER — Other Ambulatory Visit (HOSPITAL_COMMUNITY): Payer: Self-pay

## 2024-05-16 ENCOUNTER — Ambulatory Visit (INDEPENDENT_AMBULATORY_CARE_PROVIDER_SITE_OTHER): Admitting: Podiatry

## 2024-05-16 DIAGNOSIS — M25871 Other specified joint disorders, right ankle and foot: Secondary | ICD-10-CM

## 2024-05-16 NOTE — Progress Notes (Unsigned)
 Subjective:  Patient ID: Denise Morales, female    DOB: [birth year],  MRN: [id number]  Chief Complaint  Patient presents with   Foot Pain    Pt stated that she feels a knot in her foot     63 y.o. female presents with the above complaint.  Patient presents with complaint of right sesamoiditis pain.  She wanted to get it evaluated.  She feels like there is a knot in her foot.  Has not seen MRIs prior to seeing me.  She is losing some fat pad.  Denies any other acute complaints.  Wants to make sure is not surgical related   Review of Systems: Negative except as noted in the HPI. Denies N/V/F/Ch.  Past Medical History:  Diagnosis Date   Allergy    Anemia    Anxiety    Arthritis    Bilateral bunions    Bunion of left foot 07/2023   Depression    Diverticulitis    Essential hypertension    Fibromyalgia    GERD (gastroesophageal reflux disease)    Heart murmur    History of hiatal hernia    Hyperlipidemia    Hypothyroidism    Iron  deficiency anemia    Melanoma (HCC)    face   Pre-diabetes     Current Outpatient Medications:    acetaminophen  (TYLENOL ) 500 MG tablet, Take 1,000 mg by mouth See admin instructions. Take 2 tablets (1000 mg) by mouth scheduled at bedtime & every 6 hours if needed for pain., Disp: , Rfl:    buPROPion  (WELLBUTRIN  XL) 150 MG 24 hr tablet, Take 1 tablet (150 mg total) by mouth in the morning and at bedtime., Disp: 180 tablet, Rfl: 1   busPIRone  (BUSPAR ) 7.5 MG tablet, Take 1 tablet (7.5 mg total) by mouth 2 (two) times daily., Disp: 180 tablet, Rfl: 1   celecoxib  (CELEBREX ) 100 MG capsule, Take 1 capsule (100 mg total) by mouth 2 (two) times daily., Disp: 180 capsule, Rfl: 1   gabapentin  (NEURONTIN ) 300 MG capsule, Take 1 capsule (300 mg total) by mouth 3 (three) times daily., Disp: 90 capsule, Rfl: 3   Krill Oil 500 MG CAPS, Take 500 mg by mouth in the morning., Disp: , Rfl:    levothyroxine  (SYNTHROID ) 50 MCG tablet, Take 1 tablet by mouth once daily,  Disp: 90 tablet, Rfl: 0   lisinopril -hydrochlorothiazide  (ZESTORETIC ) 20-12.5 MG tablet, Take 1 tablet by mouth daily., Disp: 90 tablet, Rfl: 3   pravastatin  (PRAVACHOL ) 40 MG tablet, Take 1 tablet by mouth once daily, Disp: 90 tablet, Rfl: 0   TURMERIC PO, Take 1,500 mg by mouth in the morning., Disp: , Rfl:    Vitamin D -Vitamin K (VITAMIN K2 -VITAMIN D3 PO), Take 1 tablet by mouth in the morning., Disp: , Rfl:   Social History   Tobacco Use  Smoking Status Never   Passive exposure: Past  Smokeless Tobacco Never    Allergies  Allergen Reactions   Hydroxyzine  Other (See Comments)    hallucinations   Objective:  There were no vitals filed for this visit. There is no height or weight on file to calculate BMI. Constitutional Well developed. Well nourished.  Vascular Dorsalis pedis pulses palpable bilaterally. Posterior tibial pulses palpable bilaterally. Capillary refill normal to all digits.  No cyanosis or clubbing noted. Pedal hair growth normal.  Neurologic Normal speech. Oriented to person, place, and time. Epicritic sensation to light touch grossly present bilaterally.  Dermatologic Nails well groomed and normal in appearance. No open  wounds. No skin lesions.  Orthopedic: Pain on palpation to the sesamoidal complex pain at the medial side of the sesamoid.  No open wounds or lesion noted.  Plantar fat pad atrophy noted.   Radiographs: None Assessment:   1. Sesamoiditis of right foot    Plan:  Patient was evaluated and treated and all questions answered.  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  If it continues to be bothersome we can discuss steroid injection in the future she states understanding  No follow-ups on file.

## 2024-05-21 ENCOUNTER — Ambulatory Visit (INDEPENDENT_AMBULATORY_CARE_PROVIDER_SITE_OTHER): Admitting: Family Medicine

## 2024-05-21 ENCOUNTER — Encounter: Payer: Self-pay | Admitting: Family Medicine

## 2024-05-21 VITALS — BP 126/84 | HR 83 | Temp 97.7°F | Ht <= 58 in | Wt 161.4 lb

## 2024-05-21 DIAGNOSIS — Z0000 Encounter for general adult medical examination without abnormal findings: Secondary | ICD-10-CM

## 2024-05-21 NOTE — Progress Notes (Signed)
 Chief Complaint  Patient presents with   Annual Exam    Medicare Annual wellness visit  w/ cpe Wants to discuss excitement about wt. Loss      Subjective:   Denise Morales is a 63 y.o. female who presents for a Welcome to Medicare Exam.   Allergies (verified) Hydroxyzine    History: Past Medical History:  Diagnosis Date   Allergy    Anemia    Anxiety    Arthritis    Bilateral bunions    Bunion of left foot 07/2023   Depression    Diverticulitis    Essential hypertension    Fibromyalgia    GERD (gastroesophageal reflux disease)    Heart murmur    History of hiatal hernia    Hyperlipidemia    Hypothyroidism    Iron  deficiency anemia    Melanoma (HCC)    face   Pre-diabetes    Past Surgical History:  Procedure Laterality Date   ABDOMINAL HYSTERECTOMY  1985   AIKEN OSTEOTOMY Right 06/05/2022   Procedure: KATRINA OSTEOTOMY;  Surgeon: Tobie Franky SQUIBB, DPM;  Location: [facility];  Service: Podiatry;  Laterality: Right;   BICEPT TENODESIS Right 12/05/2022   Procedure: RIGHT BICEPS TENODESIS;  Surgeon: Addie Cordella Hamilton, MD;  Location: [facility] OR;  Service: Orthopedics;  Laterality: Right;   BUNIONECTOMY Bilateral 04/07/2024   Procedure: BUNIONECTOMY;  Surgeon: Tobie Franky SQUIBB, DPM;  Location: [facility] ORS;  Service: Orthopedics/Podiatry;  Laterality: Bilateral;   CAPSULOTOMY METATARSOPHALANGEAL Right 06/05/2022   Procedure: CAPSULOTOMY METATARSOPHALANGEAL;  Surgeon: Tobie Franky SQUIBB, DPM;  Location: [facility];  Service: Podiatry;  Laterality: Right;   CHOLECYSTECTOMY     COLONOSCOPY     COLONOSCOPY WITH ESOPHAGOGASTRODUODENOSCOPY (EGD)  12/2022   HALLUX VALGUS LAPIDUS Right 06/05/2022   Procedure: HALLUX VALGUS LAPIDUS;  Surgeon: Tobie Franky SQUIBB, DPM;  Location: [facility];  Service: Podiatry;  Laterality: Right;   HALLUX VALGUS LAPIDUS Left 09/03/2023   Procedure: ROMAYNE LOOK;  Surgeon: Tobie Franky SQUIBB, DPM;  Location: [facility]  ORS;  Service: Orthopedics/Podiatry;  Laterality: Left;  POPLITEAL BLOCK   HAMMER TOE SURGERY Right 06/05/2022   Procedure: HAMMER TOE CORRECTION;  Surgeon: Tobie Franky SQUIBB, DPM;  Location: [facility] [place];  Service: Podiatry;  Laterality: Right;   HERNIA REPAIR  2025   JOINT REPLACEMENT  2022 - 2024   REVERSE SHOULDER ARTHROPLASTY Right 12/05/2022   Procedure: RIGHT REVERSE SHOULDER ARTHROPLASTY;  Surgeon: Addie Cordella Hamilton, MD;  Location: [facility] OR;  Service: Orthopedics;  Laterality: Right;   REVERSE SHOULDER ARTHROPLASTY Left 04/16/2023   Procedure: REVERSE SHOULDER ARTHROPLASTY;  Surgeon: Addie Cordella Hamilton, MD;  Location: [facility] OR;  Service: Orthopedics;  Laterality: Left;   SHOULDER ARTHROSCOPY WITH ROTATOR CUFF REPAIR AND SUBACROMIAL DECOMPRESSION Right 08/12/2020   Procedure: RIGHT SHOULDER ARTHROSCOPY WITH ROTATOR CUFF REPAIR AND SUBACROMIAL DECOMPRESSION;  Surgeon: Vernetta Lonni GRADE, MD;  Location: [facility];  Service: Orthopedics;  Laterality: Right;   STERIOD INJECTION Left 09/03/2023   Procedure: STEROID INJECTION;  Surgeon: Tobie Franky SQUIBB, DPM;  Location: [facility] ORS;  Service: Orthopedics/Podiatry;  Laterality: Left;   TOTAL KNEE ARTHROPLASTY Right 12/24/2020   Procedure: RIGHT TOTAL KNEE ARTHROPLASTY;  Surgeon: Vernetta Lonni GRADE, MD;  Location: [facility] ORS;  Service: Orthopedics;  Laterality: Right;   TOTAL KNEE ARTHROPLASTY Left 07/15/2021   Procedure: LEFT TOTAL KNEE ARTHROPLASTY;  Surgeon: Vernetta Lonni GRADE, MD;  Location: [facility] ORS;  Service: Orthopedics;  Laterality: Left;   TUBAL  LIGATION  1987   XI ROBOTIC ASSISTED PARAESOPHAGEAL HERNIA REPAIR N/A 03/12/2023   Procedure: XI ROBOTIC ASSISTED PARAESOPHAGEAL HERNIA REPAIR WITH FUNDOPLICATION;  Surgeon: Signe Mitzie LABOR, MD;  Location: [facility] ORS;  Service: General;  Laterality: N/A;  180   Family History  Problem Relation Age of Onset   Dementia Mother    Heart Problems Mother    Stroke Mother     Hypertension Mother    Anxiety disorder Mother    Arthritis Mother    Depression Mother    Hypertension Father    Colon polyps Father    Diabetes Father    Cancer Father    Heart disease Father    Kidney disease Father    Hypertension Sister    Diabetes Sister        borderline   Fibromyalgia Sister    Anxiety disorder Sister    Arthritis Sister    COPD Sister    Depression Sister    Heart disease Sister    Kidney disease Sister    Hypertension Sister    Colon polyps Sister    Diabetes Sister    Fibromyalgia Sister    Heart Problems Sister    Arthritis Sister    Anxiety disorder Sister    COPD Sister    Depression Sister    Heart disease Sister    Hypertension Sister    Heart Problems Sister    Anxiety disorder Sister    ADD / ADHD Daughter    Diabetes Paternal Aunt    Colon cancer Neg Hx    Esophageal cancer Neg Hx    Rectal cancer Neg Hx    Stomach cancer Neg Hx    Breast cancer Neg Hx    Social History   Occupational History   Not on file  Tobacco Use   Smoking status: Never    Passive exposure: Past   Smokeless tobacco: Never  Vaping Use   Vaping status: Never Used  Substance and Sexual Activity   Alcohol use: Never   Drug use: Never   Sexual activity: Not Currently    Birth control/protection: Surgical    Comment: Hysterectomy   Tobacco Counseling Counseling given: No  SDOH Screenings   Food Insecurity: No Food Insecurity (05/21/2024)  Housing: Low Risk  (05/21/2024)  Transportation Needs: No Transportation Needs (05/21/2024)  Utilities: Not At Risk (05/21/2024)  Alcohol Screen: Medium Risk (03/31/2023)  Depression (PHQ2-9): Low Risk  (05/21/2024)  Financial Resource Strain: Low Risk  (12/06/2023)  Physical Activity: Insufficiently Active (05/21/2024)  Social Connections: Moderately Isolated (05/21/2024)  Stress: Stress Concern Present (05/21/2024)  Tobacco Use: Low Risk  (05/21/2024)  Health Literacy: Adequate Health Literacy  (05/21/2024)   See flowsheets for full screening details  Depression Screen Depression Screening Exception Documentation Depression Screening Exception:: Medical reason  PHQ 2 & 9 Depression Scale- Over the past 2 weeks, how often have you been bothered by any of the following problems? Little interest or pleasure in doing things: 0 Feeling down, depressed, or hopeless (PHQ Adolescent also includes...irritable): 0 PHQ-2 Total Score: 0 Trouble falling or staying asleep, or sleeping too much: 3 Feeling tired or having little energy: 0 Poor appetite or overeating (PHQ Adolescent also includes...weight loss): 0 Feeling bad about yourself - or that you are a failure or have let yourself or your family down: 0 Trouble concentrating on things, such as reading the newspaper or watching television (PHQ Adolescent also includes...like school work): 0 Moving or speaking so slowly that other  people could have noticed. Or the opposite - being so fidgety or restless that you have been moving around a lot more than usual: 0 Thoughts that you would be better off dead, or of hurting yourself in some way: 0 PHQ-9 Total Score: 3 If you checked off any problems, how difficult have these problems made it for you to do your work, take care of things at home, or get along with other people?: Not difficult at all  Depression Treatment Depression Interventions/Treatment : EYV7-0 Score <4 Follow-up Not Indicated      Goals Addressed             This Visit's Progress    Set My Weight Loss Goal       Follow Up Date 11/18/2024    - set weight loss goal    Why is this important?   Losing only 5 to 15 percent of your weight makes a big difference in your health.    Notes: would like wt to be between 135-140 lbs within the next six months        Visit info / Clinical Intake: Medicare Wellness Visit Type:: Welcome to [employer] (IPPE) Persons participating in visit:: patient Medicare Wellness Visit  Mode:: In-person (required for WTM) Information given by:: patient Interpreter Needed?: No Pre-visit prep was completed: no AWV questionnaire completed by patient prior to visit?: no Living arrangements:: lives with spouse/significant other Patient's Overall Health Status Rating: very good Typical amount of pain: some (fibromyalgia) Does pain affect daily life?: no Are you currently prescribed opioids?: no  Dietary Habits and Nutritional Risks How many meals a day?: 2 Eats fruit and vegetables daily?: (!) no Most meals are obtained by: preparing own meals In the last 2 weeks, have you had any of the following?: (!) nausea, vomiting, diarrhea (diarrhea due to diverticulitis) Diabetic:: no  Functional Status Activities of Daily Living (to include ambulation/medication): Independent Ambulation: Independent Medication Administration: Independent Home Management: Independent Manage your own finances?: yes Primary transportation is: driving Concerns about vision?: no *vision screening is required for WTM* ([facility] [employer] seen this year) Concerns about hearing?: no  Fall Screening Falls in the past year?: 0 Number of falls in past year: 0 Was there an injury with Fall?: 0 Fall Risk Category Calculator: 0 Patient Fall Risk Level: Low Fall Risk  Fall Risk Patient at Risk for Falls Due to: No Fall Risks Fall risk Follow up: Falls evaluation completed  Home and Transportation Safety: All rugs have non-skid backing?: yes (they have a rubbery texture but not non skid) All stairs or steps have railings?: yes (outside and they have railings) Grab bars in the bathtub or shower?: yes Have non-skid surface in bathtub or shower?: yes Good home lighting?: yes Regular seat belt use?: yes Hospital stays in the last year:: no  Cognitive Assessment Difficulty concentrating, remembering, or making decisions? : no Will 6CIT or Mini Cog be Completed: no 6CIT or Mini Cog Declined: patient  declined  Advance Directives (For Healthcare) Does Patient Have a Medical Advance Directive?: No Would patient like information on creating a medical advance directive?: No - Patient declined  Reviewed/Updated  Reviewed/Updated: Reviewed All (Medical, Surgical, Family, Medications, Allergies, Care Teams, Patient Goals)         Objective:    Today's Vitals   05/21/24 1108  BP: 126/84  Pulse: 83  Temp: 97.7 F (36.5 C)  SpO2: 95%  Weight: 161 lb 6.4 oz (73.2 kg)  Height: 4' 10 (1.473 m)  PainSc: 0-No pain   Body mass index is 33.73 kg/m.   Physical Exam Vitals and nursing note reviewed.  Constitutional:      Appearance: Normal appearance. She is normal weight.  HENT:     Head: Normocephalic and atraumatic.  Skin:    General: Skin is warm and dry.  Neurological:     General: No focal deficit present.     Mental Status: She is alert and oriented to person, place, and time. Mental status is at baseline.  Psychiatric:        Mood and Affect: Mood normal.        Behavior: Behavior normal.        Thought Content: Thought content normal.        Judgment: Judgment normal.      Current Medications (verified) Outpatient Encounter Medications as of 05/21/2024  Medication Sig   acetaminophen  (TYLENOL ) 500 MG tablet Take 1,000 mg by mouth See admin instructions. Take 2 tablets (1000 mg) by mouth scheduled at bedtime & every 6 hours if needed for pain.   buPROPion  (WELLBUTRIN  XL) 150 MG 24 hr tablet Take 1 tablet (150 mg total) by mouth in the morning and at bedtime.   busPIRone  (BUSPAR ) 7.5 MG tablet Take 1 tablet (7.5 mg total) by mouth 2 (two) times daily.   celecoxib  (CELEBREX ) 100 MG capsule Take 1 capsule (100 mg total) by mouth 2 (two) times daily.   gabapentin  (NEURONTIN ) 300 MG capsule Take 1 capsule (300 mg total) by mouth 3 (three) times daily.   Krill Oil 500 MG CAPS Take 500 mg by mouth in the morning.   levothyroxine  (SYNTHROID ) 50 MCG tablet Take 1 tablet by  mouth once daily   lisinopril -hydrochlorothiazide  (ZESTORETIC ) 20-12.5 MG tablet Take 1 tablet by mouth daily.   pravastatin  (PRAVACHOL ) 40 MG tablet Take 1 tablet by mouth once daily   TURMERIC PO Take 1,500 mg by mouth in the morning.   Vitamin D -Vitamin K (VITAMIN K2 -VITAMIN D3 PO) Take 1 tablet by mouth in the morning.   [DISCONTINUED] dicyclomine  (BENTYL ) 10 MG capsule TAKE 1 CAPSULE BY MOUTH TWICE DAILY AS NEEDED FOR ABDOMINAL PAIN   No facility-administered encounter medications on file as of 05/21/2024.   Hearing/Vision screen No results found. Immunizations and Health Maintenance Health Maintenance  Topic Date Due   Zoster Vaccines- Shingrix (1 of 2) Never done   Cervical Cancer Screening (HPV/Pap Cotest)  Never done   Pneumococcal Vaccine: 50+ Years (1 of 1 - PCV) Never done   COVID-19 Vaccine (3 - 2025-26 season) 02/25/2024   Bone Density Scan  Never done   Influenza Vaccine  09/23/2024 (Originally 01/25/2024)   Medicare Annual Wellness (AWV)  05/21/2025   Mammogram  01/29/2026   Colonoscopy  01/08/2030   Hepatitis C Screening  Completed   HIV Screening  Completed   Hepatitis B Vaccines 19-59 Average Risk  Aged Out   Meningococcal B Vaccine  Aged Out   DTaP/Tdap/Td  Discontinued    EKG: normal EKG, normal sinus rhythm, unchanged from previous tracings     Assessment/Plan:  This is a routine wellness examination for Kavya.  Patient Care Team: Kayla Jeoffrey RAMAN, FNP as PCP - General (Family Medicine)  I have personally reviewed and noted the following in the patient's chart:   Medical and social history Use of alcohol, tobacco or illicit drugs  Current medications and supplements including opioid prescriptions. Functional ability and status Nutritional status Physical activity Advanced directives List of other physicians  Hospitalizations, surgeries, and ER visits in previous 12 months Vitals Screenings to include cognitive, depression, and falls Referrals and  appointments  No orders of the defined types were placed in this encounter.  In addition, I have reviewed and discussed with patient certain preventive protocols, quality metrics, and best practice recommendations. A written personalized care plan for preventive services as well as general preventive health recommendations were provided to patient.   Jeoffrey GORMAN Barrio, FNP   05/21/2024   Return in 1 year (on 05/21/2025), or if symptoms worsen or fail to improve.

## 2024-05-21 NOTE — Patient Instructions (Signed)
 Denise Morales,  Thank you for taking the time for your Medicare Wellness Visit. I appreciate your continued commitment to your health goals. Please review the care plan we discussed, and feel free to reach out if I can assist you further.  Please note that Annual Wellness Visits do not include a physical exam. Some assessments may be limited, especially if the visit was conducted virtually. If needed, we may recommend an in-person follow-up with your provider.  Ongoing Care Seeing your primary care provider every 3 to 6 months helps us  monitor your health and provide consistent, personalized care.  Referrals If a referral was made during today's visit and you haven't received any updates within two weeks, please contact the referred provider directly to check on the status.  Recommended Screenings:  Health Maintenance  Topic Date Due   Medicare Annual Wellness Visit  Never done   Zoster (Shingles) Vaccine (1 of 2) Never done   Pap with HPV screening  Never done   Pneumococcal Vaccine for age over 45 (1 of 1 - PCV) Never done   COVID-19 Vaccine (3 - 2025-26 season) 02/25/2024   Osteoporosis screening with Bone Density Scan  Never done   Flu Shot  09/23/2024*   Breast Cancer Screening  01/29/2026   Colon Cancer Screening  01/08/2030   Hepatitis C Screening  Completed   HIV Screening  Completed   Hepatitis B Vaccine  Aged Out   Meningitis B Vaccine  Aged Out   DTaP/Tdap/Td vaccine  Discontinued  *Topic was postponed. The date shown is not the original due date.       05/21/2024   11:26 AM  Advanced Directives  Does Patient Have a Medical Advance Directive? No  Would patient like information on creating a medical advance directive? No - Patient declined    Vision: Annual vision screenings are recommended for early detection of glaucoma, cataracts, and diabetic retinopathy. These exams can also reveal signs of chronic conditions such as diabetes and high blood pressure.  Dental:  Annual dental screenings help detect early signs of oral cancer, gum disease, and other conditions linked to overall health, including heart disease and diabetes.  Please see the attached documents for additional preventive care recommendations.

## 2024-05-21 NOTE — Progress Notes (Deleted)
 No chief complaint on file.    Subjective:   Denise Morales is a 63 y.o. female who presents for a Welcome to Medicare Exam.   Allergies (verified) Hydroxyzine    History: Past Medical History:  Diagnosis Date   Allergy    Anemia    Anxiety    Arthritis    Bilateral bunions    Bunion of left foot 07/2023   Depression    Diverticulitis    Essential hypertension    Fibromyalgia    GERD (gastroesophageal reflux disease)    Heart murmur    History of hiatal hernia    Hyperlipidemia    Hypothyroidism    Iron  deficiency anemia    Melanoma (HCC)    face   Pre-diabetes    Past Surgical History:  Procedure Laterality Date   ABDOMINAL HYSTERECTOMY  1985   AIKEN OSTEOTOMY Right 06/05/2022   Procedure: KATRINA OSTEOTOMY;  Surgeon: Tobie Franky SQUIBB, DPM;  Location: [facility];  Service: Podiatry;  Laterality: Right;   BICEPT TENODESIS Right 12/05/2022   Procedure: RIGHT BICEPS TENODESIS;  Surgeon: Addie Cordella Hamilton, MD;  Location: [facility] OR;  Service: Orthopedics;  Laterality: Right;   BUNIONECTOMY Bilateral 04/07/2024   Procedure: BUNIONECTOMY;  Surgeon: Tobie Franky SQUIBB, DPM;  Location: [facility];  Service: Orthopedics/Podiatry;  Laterality: Bilateral;   CAPSULOTOMY METATARSOPHALANGEAL Right 06/05/2022   Procedure: CAPSULOTOMY METATARSOPHALANGEAL;  Surgeon: Tobie Franky SQUIBB, DPM;  Location: [facility];  Service: Podiatry;  Laterality: Right;   CHOLECYSTECTOMY     COLONOSCOPY     COLONOSCOPY WITH ESOPHAGOGASTRODUODENOSCOPY (EGD)  12/2022   HALLUX VALGUS LAPIDUS Right 06/05/2022   Procedure: HALLUX VALGUS LAPIDUS;  Surgeon: Tobie Franky SQUIBB, DPM;  Location: [facility];  Service: Podiatry;  Laterality: Right;   HALLUX VALGUS LAPIDUS Left 09/03/2023   Procedure: ROMAYNE LOOK;  Surgeon: Tobie Franky SQUIBB, DPM;  Location: [facility];  Service: Orthopedics/Podiatry;  Laterality: Left;  POPLITEAL BLOCK   HAMMER TOE SURGERY Right 06/05/2022    Procedure: HAMMER TOE CORRECTION;  Surgeon: Tobie Franky SQUIBB, DPM;  Location: [facility];  Service: Podiatry;  Laterality: Right;   HERNIA REPAIR  2025   JOINT REPLACEMENT  2022 - 2024   REVERSE SHOULDER ARTHROPLASTY Right 12/05/2022   Procedure: RIGHT REVERSE SHOULDER ARTHROPLASTY;  Surgeon: Addie Cordella Hamilton, MD;  Location: [facility];  Service: Orthopedics;  Laterality: Right;   REVERSE SHOULDER ARTHROPLASTY Left 04/16/2023   Procedure: REVERSE SHOULDER ARTHROPLASTY;  Surgeon: Addie Cordella Hamilton, MD;  Location: [facility] OR;  Service: Orthopedics;  Laterality: Left;   SHOULDER ARTHROSCOPY WITH ROTATOR CUFF REPAIR AND SUBACROMIAL DECOMPRESSION Right 08/12/2020   Procedure: RIGHT SHOULDER ARTHROSCOPY WITH ROTATOR CUFF REPAIR AND SUBACROMIAL DECOMPRESSION;  Surgeon: Vernetta Lonni GRADE, MD;  Location: [facility];  Service: Orthopedics;  Laterality: Right;   STERIOD INJECTION Left 09/03/2023   Procedure: STEROID INJECTION;  Surgeon: Tobie Franky SQUIBB, DPM;  Location: [facility];  Service: Orthopedics/Podiatry;  Laterality: Left;   TOTAL KNEE ARTHROPLASTY Right 12/24/2020   Procedure: RIGHT TOTAL KNEE ARTHROPLASTY;  Surgeon: Vernetta Lonni GRADE, MD;  Location: [facility];  Service: Orthopedics;  Laterality: Right;   TOTAL KNEE ARTHROPLASTY Left 07/15/2021   Procedure: LEFT TOTAL KNEE ARTHROPLASTY;  Surgeon: Vernetta Lonni GRADE, MD;  Location: [facility];  Service: Orthopedics;  Laterality: Left;   TUBAL LIGATION  1987   XI ROBOTIC ASSISTED PARAESOPHAGEAL HERNIA REPAIR N/A 03/12/2023   Procedure: XI ROBOTIC ASSISTED PARAESOPHAGEAL HERNIA REPAIR WITH FUNDOPLICATION;  Surgeon: Signe Mitzie LABOR, MD;  Location: [facility];  Service: General;  Laterality: N/A;  180   Family History  Problem Relation Age of Onset   Dementia Mother    Heart Problems Mother    Stroke Mother    Hypertension Mother    Anxiety disorder Mother    Arthritis Mother    Depression Mother    Hypertension  Father    Colon polyps Father    Diabetes Father    Cancer Father    Heart disease Father    Kidney disease Father    Hypertension Sister    Diabetes Sister        borderline   Fibromyalgia Sister    Anxiety disorder Sister    Arthritis Sister    COPD Sister    Depression Sister    Heart disease Sister    Kidney disease Sister    Hypertension Sister    Colon polyps Sister    Diabetes Sister    Fibromyalgia Sister    Heart Problems Sister    Arthritis Sister    Anxiety disorder Sister    COPD Sister    Depression Sister    Heart disease Sister    Hypertension Sister    Heart Problems Sister    Anxiety disorder Sister    ADD / ADHD Daughter    Diabetes Paternal Aunt    Colon cancer Neg Hx    Esophageal cancer Neg Hx    Rectal cancer Neg Hx    Stomach cancer Neg Hx    Breast cancer Neg Hx    Social History   Occupational History   Not on file  Tobacco Use   Smoking status: Never    Passive exposure: Past   Smokeless tobacco: Never  Vaping Use   Vaping status: Never Used  Substance and Sexual Activity   Alcohol use: Never   Drug use: Never   Sexual activity: Not Currently    Birth control/protection: Surgical    Comment: Hysterectomy   Tobacco Counseling Counseling given: Not Answered  SDOH Screenings   Food Insecurity: No Food Insecurity (12/06/2023)  Housing: Unknown (12/06/2023)  Transportation Needs: No Transportation Needs (12/06/2023)  Utilities: Not At Risk (03/12/2023)  Alcohol Screen: Medium Risk (03/31/2023)  Depression (PHQ2-9): Low Risk  (03/11/2024)  Recent Concern: Depression (PHQ2-9) - Medium Risk (02/20/2024)  Financial Resource Strain: Low Risk  (12/06/2023)  Physical Activity: Inactive (12/06/2023)  Social Connections: Moderately Isolated (12/06/2023)  Stress: No Stress Concern Present (12/06/2023)  Tobacco Use: Low Risk  (04/07/2024)   See flowsheets for full screening details  Depression Screen Depression Screening Exception  Documentation Depression Screening Exception:: Medical reason  PHQ 2 & 9 Depression Scale- Over the past 2 weeks, how often have you been bothered by any of the following problems? Little interest or pleasure in doing things: 0 Feeling down, depressed, or hopeless (PHQ Adolescent also includes...irritable): 0 PHQ-2 Total Score: 0 Trouble falling or staying asleep, or sleeping too much: 1 Feeling tired or having little energy: 1 Poor appetite or overeating (PHQ Adolescent also includes...weight loss): 0 Feeling bad about yourself - or that you are a failure or have let yourself or your family down: 0 Trouble concentrating on things, such as reading the newspaper or watching television (PHQ Adolescent also includes...like school work): 0 Moving or speaking so slowly that other people could have noticed. Or the opposite - being so fidgety or restless that you have been moving around a lot more than usual:  0 Thoughts that you would be better off dead, or of hurting yourself in some way: 0 PHQ-9 Total Score: 2 If you checked off any problems, how difficult have these problems made it for you to do your work, take care of things at home, or get along with other people?: Not difficult at all  Depression Treatment Depression Interventions/Treatment : EYV7-0 Score <4 Follow-up Not Indicated      Goals Addressed   None    Functional Status Activities of Daily Living (to include ambulation/medication): (Patient-Rptd) Independent Ambulation: (Patient-Rptd) Independent Home Management: (Patient-Rptd) Independent  Fall Screening Falls in the past year?: (Patient-Rptd) 1 Number of falls in past year: (Patient-Rptd) 0 Was there an injury with Fall?: (Patient-Rptd) 0 Fall Risk Category Calculator: (Patient-Rptd) 1 Patient Fall Risk Level: (Patient-Rptd) Low Fall Risk  Fall Risk Patient at Risk for Falls Due to: No Fall Risks Fall risk Follow up: Falls evaluation completed  Advance Directives  (For Healthcare) Does Patient Have a Medical Advance Directive?: No Would patient like information on creating a medical advance directive?: No - Patient declined         Objective:    There were no vitals filed for this visit. There is no height or weight on file to calculate BMI.   Physical Exam{(optional), or other factors deemed appropriate based on the beneficiary's medical and social history and current clinical standards. - TEXT BETWEEN BRACKETS WILL DISAPPEAR WHEN YOU SIGN THE ENCOUNTER:1}   Current Medications (verified) Outpatient Encounter Medications as of 05/21/2024  Medication Sig   acetaminophen  (TYLENOL ) 500 MG tablet Take 1,000 mg by mouth See admin instructions. Take 2 tablets (1000 mg) by mouth scheduled at bedtime & every 6 hours if needed for pain.   buPROPion  (WELLBUTRIN  XL) 150 MG 24 hr tablet Take 1 tablet (150 mg total) by mouth in the morning and at bedtime.   busPIRone  (BUSPAR ) 7.5 MG tablet Take 1 tablet (7.5 mg total) by mouth 2 (two) times daily.   celecoxib  (CELEBREX ) 100 MG capsule Take 1 capsule (100 mg total) by mouth 2 (two) times daily.   dicyclomine  (BENTYL ) 10 MG capsule TAKE 1 CAPSULE BY MOUTH TWICE DAILY AS NEEDED FOR ABDOMINAL PAIN   gabapentin  (NEURONTIN ) 300 MG capsule Take 1 capsule (300 mg total) by mouth 3 (three) times daily.   Krill Oil 500 MG CAPS Take 500 mg by mouth in the morning.   levothyroxine  (SYNTHROID ) 50 MCG tablet Take 1 tablet by mouth once daily   lisinopril -hydrochlorothiazide  (ZESTORETIC ) 20-12.5 MG tablet Take 1 tablet by mouth daily.   pravastatin  (PRAVACHOL ) 40 MG tablet Take 1 tablet by mouth once daily   TURMERIC PO Take 1,500 mg by mouth in the morning.   Vitamin D -Vitamin K (VITAMIN K2 -VITAMIN D3 PO) Take 1 tablet by mouth in the morning.   No facility-administered encounter medications on file as of 05/21/2024.   Hearing/Vision screen No results found. Immunizations and Health Maintenance Health Maintenance   Topic Date Due   Medicare Annual Wellness (AWV)  Never done   Zoster Vaccines- Shingrix (1 of 2) Never done   Cervical Cancer Screening (HPV/Pap Cotest)  Never done   Pneumococcal Vaccine: 50+ Years (1 of 1 - PCV) Never done   COVID-19 Vaccine (3 - 2025-26 season) 02/25/2024   Bone Density Scan  Never done   Influenza Vaccine  09/23/2024 (Originally 01/25/2024)   Mammogram  01/29/2026   Colonoscopy  01/08/2030   Hepatitis C Screening  Completed   HIV Screening  Completed   Hepatitis B Vaccines 19-59 Average Risk  Aged Out   Meningococcal B Vaccine  Aged Out   DTaP/Tdap/Td  Discontinued    EKG: {ekg findings:315101}     Assessment/Plan:  This is a routine wellness examination for Kymberlee.  Patient Care Team: Kayla Jeoffrey RAMAN, FNP as PCP - General (Family Medicine)  I have personally reviewed and noted the following in the patient's chart:   Medical and social history Use of alcohol, tobacco or illicit drugs  Current medications and supplements including opioid prescriptions. Functional ability and status Nutritional status Physical activity Advanced directives List of other physicians Hospitalizations, surgeries, and ER visits in previous 12 months Vitals Screenings to include cognitive, depression, and falls Referrals and appointments  No orders of the defined types were placed in this encounter.  In addition, I have reviewed and discussed with patient certain preventive protocols, quality metrics, and best practice recommendations. A written personalized care plan for preventive services as well as general preventive health recommendations were provided to patient.   [place] JONELLE Bustle, CMA   05/21/2024   No follow-ups on file.

## 2024-05-21 NOTE — Progress Notes (Deleted)
 No chief complaint on file.    Subjective:   Denise Morales is a 63 y.o. female who presents for a Welcome to Medicare Exam.   Allergies (verified) Hydroxyzine    History: Past Medical History:  Diagnosis Date   Allergy    Anemia    Anxiety    Arthritis    Bilateral bunions    Bunion of left foot 07/2023   Depression    Diverticulitis    Essential hypertension    Fibromyalgia    GERD (gastroesophageal reflux disease)    Heart murmur    History of hiatal hernia    Hyperlipidemia    Hypothyroidism    Iron  deficiency anemia    Melanoma (HCC)    face   Pre-diabetes    Past Surgical History:  Procedure Laterality Date   ABDOMINAL HYSTERECTOMY  1985   AIKEN OSTEOTOMY Right 06/05/2022   Procedure: KATRINA OSTEOTOMY;  Surgeon: Tobie Franky SQUIBB, DPM;  Location: [facility];  Service: Podiatry;  Laterality: Right;   BICEPT TENODESIS Right 12/05/2022   Procedure: RIGHT BICEPS TENODESIS;  Surgeon: Addie Cordella Hamilton, MD;  Location: [facility] OR;  Service: Orthopedics;  Laterality: Right;   BUNIONECTOMY Bilateral 04/07/2024   Procedure: BUNIONECTOMY;  Surgeon: Tobie Franky SQUIBB, DPM;  Location: [facility];  Service: Orthopedics/Podiatry;  Laterality: Bilateral;   CAPSULOTOMY METATARSOPHALANGEAL Right 06/05/2022   Procedure: CAPSULOTOMY METATARSOPHALANGEAL;  Surgeon: Tobie Franky SQUIBB, DPM;  Location: [facility];  Service: Podiatry;  Laterality: Right;   CHOLECYSTECTOMY     COLONOSCOPY     COLONOSCOPY WITH ESOPHAGOGASTRODUODENOSCOPY (EGD)  12/2022   HALLUX VALGUS LAPIDUS Right 06/05/2022   Procedure: HALLUX VALGUS LAPIDUS;  Surgeon: Tobie Franky SQUIBB, DPM;  Location: [facility];  Service: Podiatry;  Laterality: Right;   HALLUX VALGUS LAPIDUS Left 09/03/2023   Procedure: ROMAYNE LOOK;  Surgeon: Tobie Franky SQUIBB, DPM;  Location: [facility];  Service: Orthopedics/Podiatry;  Laterality: Left;  POPLITEAL BLOCK   HAMMER TOE SURGERY Right 06/05/2022    Procedure: HAMMER TOE CORRECTION;  Surgeon: Tobie Franky SQUIBB, DPM;  Location: [facility] [place];  Service: Podiatry;  Laterality: Right;   HERNIA REPAIR  2025   JOINT REPLACEMENT  2022 - 2024   REVERSE SHOULDER ARTHROPLASTY Right 12/05/2022   Procedure: RIGHT REVERSE SHOULDER ARTHROPLASTY;  Surgeon: Addie Cordella Hamilton, MD;  Location: [facility] OR;  Service: Orthopedics;  Laterality: Right;   REVERSE SHOULDER ARTHROPLASTY Left 04/16/2023   Procedure: REVERSE SHOULDER ARTHROPLASTY;  Surgeon: Addie Cordella Hamilton, MD;  Location: [facility] OR;  Service: Orthopedics;  Laterality: Left;   SHOULDER ARTHROSCOPY WITH ROTATOR CUFF REPAIR AND SUBACROMIAL DECOMPRESSION Right 08/12/2020   Procedure: RIGHT SHOULDER ARTHROSCOPY WITH ROTATOR CUFF REPAIR AND SUBACROMIAL DECOMPRESSION;  Surgeon: Vernetta Lonni GRADE, MD;  Location: [facility];  Service: Orthopedics;  Laterality: Right;   STERIOD INJECTION Left 09/03/2023   Procedure: STEROID INJECTION;  Surgeon: Tobie Franky SQUIBB, DPM;  Location: [facility];  Service: Orthopedics/Podiatry;  Laterality: Left;   TOTAL KNEE ARTHROPLASTY Right 12/24/2020   Procedure: RIGHT TOTAL KNEE ARTHROPLASTY;  Surgeon: Vernetta Lonni GRADE, MD;  Location: [facility];  Service: Orthopedics;  Laterality: Right;   TOTAL KNEE ARTHROPLASTY Left 07/15/2021   Procedure: LEFT TOTAL KNEE ARTHROPLASTY;  Surgeon: Vernetta Lonni GRADE, MD;  Location: [facility];  Service: Orthopedics;  Laterality: Left;   TUBAL LIGATION  1987   XI ROBOTIC ASSISTED PARAESOPHAGEAL HERNIA REPAIR N/A 03/12/2023   Procedure: XI ROBOTIC ASSISTED PARAESOPHAGEAL HERNIA REPAIR WITH FUNDOPLICATION;  Surgeon: Signe Mitzie LABOR, MD;  Location: [facility];  Service: General;  Laterality: N/A;  180   Family History  Problem Relation Age of Onset   Dementia Mother    Heart Problems Mother    Stroke Mother    Hypertension Mother    Anxiety disorder Mother    Arthritis Mother    Depression Mother    Hypertension  Father    Colon polyps Father    Diabetes Father    Cancer Father    Heart disease Father    Kidney disease Father    Hypertension Sister    Diabetes Sister        borderline   Fibromyalgia Sister    Anxiety disorder Sister    Arthritis Sister    COPD Sister    Depression Sister    Heart disease Sister    Kidney disease Sister    Hypertension Sister    Colon polyps Sister    Diabetes Sister    Fibromyalgia Sister    Heart Problems Sister    Arthritis Sister    Anxiety disorder Sister    COPD Sister    Depression Sister    Heart disease Sister    Hypertension Sister    Heart Problems Sister    Anxiety disorder Sister    ADD / ADHD Daughter    Diabetes Paternal Aunt    Colon cancer Neg Hx    Esophageal cancer Neg Hx    Rectal cancer Neg Hx    Stomach cancer Neg Hx    Breast cancer Neg Hx    Social History   Occupational History   Not on file  Tobacco Use   Smoking status: Never    Passive exposure: Past   Smokeless tobacco: Never  Vaping Use   Vaping status: Never Used  Substance and Sexual Activity   Alcohol use: Never   Drug use: Never   Sexual activity: Not Currently    Birth control/protection: Surgical    Comment: Hysterectomy   Tobacco Counseling Counseling given: Not Answered  SDOH Screenings   Food Insecurity: No Food Insecurity (12/06/2023)  Housing: Unknown (12/06/2023)  Transportation Needs: No Transportation Needs (12/06/2023)  Utilities: Not At Risk (03/12/2023)  Alcohol Screen: Medium Risk (03/31/2023)  Depression (PHQ2-9): Low Risk  (03/11/2024)  Recent Concern: Depression (PHQ2-9) - Medium Risk (02/20/2024)  Financial Resource Strain: Low Risk  (12/06/2023)  Physical Activity: Inactive (12/06/2023)  Social Connections: Moderately Isolated (12/06/2023)  Stress: No Stress Concern Present (12/06/2023)  Tobacco Use: Low Risk  (04/07/2024)   See flowsheets for full screening details  Depression Screen Depression Screening Exception  Documentation Depression Screening Exception:: Medical reason  PHQ 2 & 9 Depression Scale- Over the past 2 weeks, how often have you been bothered by any of the following problems? Little interest or pleasure in doing things: 0 Feeling down, depressed, or hopeless (PHQ Adolescent also includes...irritable): 0 PHQ-2 Total Score: 0 Trouble falling or staying asleep, or sleeping too much: 1 Feeling tired or having little energy: 1 Poor appetite or overeating (PHQ Adolescent also includes...weight loss): 0 Feeling bad about yourself - or that you are a failure or have let yourself or your family down: 0 Trouble concentrating on things, such as reading the newspaper or watching television (PHQ Adolescent also includes...like school work): 0 Moving or speaking so slowly that other people could have noticed. Or the opposite - being so fidgety or restless that you have been moving around a lot more than usual:  0 Thoughts that you would be better off dead, or of hurting yourself in some way: 0 PHQ-9 Total Score: 2 If you checked off any problems, how difficult have these problems made it for you to do your work, take care of things at home, or get along with other people?: Not difficult at all  Depression Treatment Depression Interventions/Treatment : EYV7-0 Score <4 Follow-up Not Indicated      Goals Addressed   None    Functional Status Activities of Daily Living (to include ambulation/medication): (Patient-Rptd) Independent Ambulation: (Patient-Rptd) Independent Home Management: (Patient-Rptd) Independent  Fall Screening Falls in the past year?: (Patient-Rptd) 1 Number of falls in past year: (Patient-Rptd) 0 Was there an injury with Fall?: (Patient-Rptd) 0 Fall Risk Category Calculator: (Patient-Rptd) 1 Patient Fall Risk Level: (Patient-Rptd) Low Fall Risk  Fall Risk Patient at Risk for Falls Due to: No Fall Risks Fall risk Follow up: Falls evaluation completed  Advance Directives  (For Healthcare) Does Patient Have a Medical Advance Directive?: No Would patient like information on creating a medical advance directive?: No - Patient declined         Objective:    There were no vitals filed for this visit. There is no height or weight on file to calculate BMI.   Physical Exam{(optional), or other factors deemed appropriate based on the beneficiary's medical and social history and current clinical standards. - TEXT BETWEEN BRACKETS WILL DISAPPEAR WHEN YOU SIGN THE ENCOUNTER:1}   Current Medications (verified) Outpatient Encounter Medications as of 05/21/2024  Medication Sig   acetaminophen  (TYLENOL ) 500 MG tablet Take 1,000 mg by mouth See admin instructions. Take 2 tablets (1000 mg) by mouth scheduled at bedtime & every 6 hours if needed for pain.   buPROPion  (WELLBUTRIN  XL) 150 MG 24 hr tablet Take 1 tablet (150 mg total) by mouth in the morning and at bedtime.   busPIRone  (BUSPAR ) 7.5 MG tablet Take 1 tablet (7.5 mg total) by mouth 2 (two) times daily.   celecoxib  (CELEBREX ) 100 MG capsule Take 1 capsule (100 mg total) by mouth 2 (two) times daily.   dicyclomine  (BENTYL ) 10 MG capsule TAKE 1 CAPSULE BY MOUTH TWICE DAILY AS NEEDED FOR ABDOMINAL PAIN   gabapentin  (NEURONTIN ) 300 MG capsule Take 1 capsule (300 mg total) by mouth 3 (three) times daily.   Krill Oil 500 MG CAPS Take 500 mg by mouth in the morning.   levothyroxine  (SYNTHROID ) 50 MCG tablet Take 1 tablet by mouth once daily   lisinopril -hydrochlorothiazide  (ZESTORETIC ) 20-12.5 MG tablet Take 1 tablet by mouth daily.   pravastatin  (PRAVACHOL ) 40 MG tablet Take 1 tablet by mouth once daily   TURMERIC PO Take 1,500 mg by mouth in the morning.   Vitamin D -Vitamin K (VITAMIN K2 -VITAMIN D3 PO) Take 1 tablet by mouth in the morning.   No facility-administered encounter medications on file as of 05/21/2024.   Hearing/Vision screen No results found. Immunizations and Health Maintenance Health Maintenance   Topic Date Due   Medicare Annual Wellness (AWV)  Never done   Zoster Vaccines- Shingrix (1 of 2) Never done   Cervical Cancer Screening (HPV/Pap Cotest)  Never done   Pneumococcal Vaccine: 50+ Years (1 of 1 - PCV) Never done   COVID-19 Vaccine (3 - 2025-26 season) 02/25/2024   Bone Density Scan  Never done   Influenza Vaccine  09/23/2024 (Originally 01/25/2024)   Mammogram  01/29/2026   Colonoscopy  01/08/2030   Hepatitis C Screening  Completed   HIV Screening  Completed   Hepatitis B Vaccines 19-59 Average Risk  Aged Out   Meningococcal B Vaccine  Aged Out   DTaP/Tdap/Td  Discontinued    EKG: normal EKG, normal sinus rhythm, unchanged from previous tracings     Assessment/Plan:  This is a routine wellness examination for Romy.  Patient Care Team: Kayla Jeoffrey RAMAN, FNP as PCP - General (Family Medicine)  I have personally reviewed and noted the following in the patient's chart:   Medical and social history Use of alcohol, tobacco or illicit drugs  Current medications and supplements including opioid prescriptions. Functional ability and status Nutritional status Physical activity Advanced directives List of other physicians Hospitalizations, surgeries, and ER visits in previous 12 months Vitals Screenings to include cognitive, depression, and falls Referrals and appointments  No orders of the defined types were placed in this encounter.  In addition, I have reviewed and discussed with patient certain preventive protocols, quality metrics, and best practice recommendations. A written personalized care plan for preventive services as well as general preventive health recommendations were provided to patient.   [place]usa JONELLE Bustle, CMA   05/21/2024   Return in 1 year (on 05/21/2025).

## 2024-06-01 ENCOUNTER — Other Ambulatory Visit: Payer: Self-pay | Admitting: Family Medicine

## 2024-06-01 DIAGNOSIS — E782 Mixed hyperlipidemia: Secondary | ICD-10-CM

## 2024-06-06 ENCOUNTER — Telehealth: Payer: Self-pay | Admitting: Gastroenterology

## 2024-06-06 MED ORDER — AMOXICILLIN-POT CLAVULANATE 875-125 MG PO TABS: 1.0000 | ORAL_TABLET | Freq: Two times a day (BID) | ORAL | 0 refills | Status: AC

## 2024-06-06 NOTE — Telephone Encounter (Signed)
 Patient is advised of Bayley's recommendations. Rx sent to pharmacy for Augmentin  twice daily x 14 days. Should go to the ER for worsening symptoms if these continue despite antibiotics. She verbalizes understanding.

## 2024-06-06 NOTE — Telephone Encounter (Signed)
 Spoke w pt about reflux symptoms. Pt stated she thinks flare up is happening. Pt requesting a call back for medication advice. Please advise thank you.

## 2024-06-06 NOTE — Addendum Note (Signed)
 Addended by: CLAUDENE NAOMIE SAILOR on: 06/06/2024 05:28 PM   Modules accepted: Orders

## 2024-06-06 NOTE — Telephone Encounter (Signed)
 Spoke to patient. Patient states she started with left lower quadrant pain this morning. Patient states that her pain is constant. Patient describes the pain as aching with sharp pains at times. Patient denies any fever, nausea or vomiting at this time. Previous history of diverticulitis. Patient states she was instructed by [facility] that if she had these symptoms like previously to contact the office. Please advise.

## 2024-06-08 ENCOUNTER — Other Ambulatory Visit: Payer: Self-pay | Admitting: Family Medicine

## 2024-06-08 DIAGNOSIS — E039 Hypothyroidism, unspecified: Secondary | ICD-10-CM

## 2024-06-16 ENCOUNTER — Telehealth: Payer: Self-pay

## 2024-06-16 ENCOUNTER — Encounter (HOSPITAL_COMMUNITY): Payer: Self-pay | Admitting: Gastroenterology

## 2024-06-16 ENCOUNTER — Other Ambulatory Visit: Payer: Self-pay

## 2024-06-16 NOTE — Telephone Encounter (Signed)
 Noted all information has been sent to the pt via mail and My Chart

## 2024-06-16 NOTE — Progress Notes (Signed)
 Anesthesia Review:  PCP: jeoffrey Barrio  Cardiologist :  PPM/ ICD: Device Orders: Rep Notified:  Chest x-ray : EKG : 04/08/2024  Echo : 2023  Stress test: 2023  CT Card-2023  Cardiac Cath :   Activity level:  Sleep Study/ CPAP : Fasting Blood Sugar :      / Checks Blood Sugar -- times a day:     Prediabets- does not check glucose at home on no meds   Blood Thinner/ Instructions /Last Dose: ASA / Instructions/ Last Dose :    Recent Labs- 04/29/2024

## 2024-06-16 NOTE — Telephone Encounter (Signed)
 Procedure:ULTRASOUND UPPER GI Procedure date: 06/24/24 Procedure location: [facility] Arrival Time: 2:41 Spoke with the patient Y/N: N Any prep concerns? N Has the patient obtained the prep from the pharmacy ? N Do you have a care partner and transportation: N Any additional concerns? N   I called patient on three different occasions. I left a detailed message and the office number just in a case patient  has question are concerns.

## 2024-06-16 NOTE — Telephone Encounter (Signed)
Thanks Patty for the update. GM

## 2024-06-23 NOTE — Anesthesia Preprocedure Evaluation (Addendum)
"                                    Anesthesia Evaluation  Patient identified by MRN, date of birth, ID band Patient awake    Reviewed: Allergy & Precautions, NPO status , Patient's Chart, lab work & pertinent test results  History of Anesthesia Complications Negative for: history of anesthetic complications  Airway Mallampati: I  TM Distance: >3 FB Neck ROM: Full    Dental no notable dental hx. (+) Teeth Intact, Dental Advisory Given   Pulmonary    Pulmonary exam normal breath sounds clear to auscultation       Cardiovascular hypertension, (-) angina (-) Past MI Normal cardiovascular exam Rhythm:Regular Rate:Normal     Neuro/Psych  PSYCHIATRIC DISORDERS Anxiety Depression     Neuromuscular disease    GI/Hepatic   Endo/Other  Hypothyroidism    Renal/GU      Musculoskeletal  (+) Arthritis ,  Fibromyalgia -  Abdominal   Peds  Hematology   Anesthesia Other Findings   Reproductive/Obstetrics                              Anesthesia Physical Anesthesia Plan  ASA: 3  Anesthesia Plan: MAC   Post-op Pain Management: Minimal or no pain anticipated   Induction: Intravenous  PONV Risk Score and Plan: Treatment may vary due to age or medical condition and Propofol  infusion  Airway Management Planned: Nasal Cannula and Natural Airway  Additional Equipment: None  Intra-op Plan:   Post-operative Plan: Extubation in OR  Informed Consent: I have reviewed the patients History and Physical, chart, labs and discussed the procedure including the risks, benefits and alternatives for the proposed anesthesia with the patient or authorized representative who has indicated his/her understanding and acceptance.     Dental advisory given  Plan Discussed with:   Anesthesia Plan Comments: (Upper GI ultrasound for common bile duct dilitation)         Anesthesia Quick Evaluation  "

## 2024-06-24 ENCOUNTER — Ambulatory Visit (HOSPITAL_COMMUNITY)
Admission: RE | Admit: 2024-06-24 | Discharge: 2024-06-24 | Disposition: A | Discharge status: Home / Self Care | Attending: Gastroenterology | Admitting: Gastroenterology

## 2024-06-24 ENCOUNTER — Encounter (HOSPITAL_COMMUNITY)
Admission: RE | Disposition: A | Discharge status: Home / Self Care | Payer: Self-pay | Source: Home / Self Care | Attending: Gastroenterology

## 2024-06-24 ENCOUNTER — Other Ambulatory Visit: Payer: Self-pay

## 2024-06-24 ENCOUNTER — Encounter (HOSPITAL_COMMUNITY): Payer: Self-pay | Admitting: Gastroenterology

## 2024-06-24 ENCOUNTER — Ambulatory Visit (HOSPITAL_COMMUNITY): Payer: Self-pay | Admitting: Anesthesiology

## 2024-06-24 DIAGNOSIS — K3189 Other diseases of stomach and duodenum: Secondary | ICD-10-CM | POA: Diagnosis not present

## 2024-06-24 DIAGNOSIS — M1990 Unspecified osteoarthritis, unspecified site: Secondary | ICD-10-CM | POA: Insufficient documentation

## 2024-06-24 DIAGNOSIS — Z0000 Encounter for general adult medical examination without abnormal findings: Secondary | ICD-10-CM

## 2024-06-24 DIAGNOSIS — I899 Noninfective disorder of lymphatic vessels and lymph nodes, unspecified: Secondary | ICD-10-CM | POA: Diagnosis not present

## 2024-06-24 DIAGNOSIS — F419 Anxiety disorder, unspecified: Secondary | ICD-10-CM | POA: Diagnosis not present

## 2024-06-24 DIAGNOSIS — K2289 Other specified disease of esophagus: Secondary | ICD-10-CM

## 2024-06-24 DIAGNOSIS — E039 Hypothyroidism, unspecified: Secondary | ICD-10-CM | POA: Diagnosis not present

## 2024-06-24 DIAGNOSIS — F418 Other specified anxiety disorders: Secondary | ICD-10-CM

## 2024-06-24 DIAGNOSIS — K838 Other specified diseases of biliary tract: Secondary | ICD-10-CM

## 2024-06-24 DIAGNOSIS — K2950 Unspecified chronic gastritis without bleeding: Secondary | ICD-10-CM | POA: Diagnosis not present

## 2024-06-24 DIAGNOSIS — K5710 Diverticulosis of small intestine without perforation or abscess without bleeding: Secondary | ICD-10-CM | POA: Diagnosis not present

## 2024-06-24 DIAGNOSIS — M797 Fibromyalgia: Secondary | ICD-10-CM | POA: Diagnosis not present

## 2024-06-24 DIAGNOSIS — F32A Depression, unspecified: Secondary | ICD-10-CM | POA: Insufficient documentation

## 2024-06-24 DIAGNOSIS — I10 Essential (primary) hypertension: Secondary | ICD-10-CM | POA: Insufficient documentation

## 2024-06-24 DIAGNOSIS — K2970 Gastritis, unspecified, without bleeding: Secondary | ICD-10-CM

## 2024-06-24 HISTORY — PX: EUS: SHX5427

## 2024-06-24 HISTORY — PX: ESOPHAGOGASTRODUODENOSCOPY: SHX5428

## 2024-06-24 HISTORY — PX: BIOPSY OF SKIN SUBCUTANEOUS TISSUE AND/OR MUCOUS MEMBRANE: SHX6741

## 2024-06-24 SURGERY — ULTRASOUND, UPPER GI TRACT, ENDOSCOPIC
Anesthesia: Monitor Anesthesia Care

## 2024-06-24 MED ORDER — FENTANYL CITRATE (PF) 100 MCG/2ML IJ SOLN
INTRAMUSCULAR | Status: DC | PRN
  Administered 2024-06-24: 50 ug via INTRAVENOUS

## 2024-06-24 MED ORDER — SODIUM CHLORIDE 0.9 % IV SOLN: INTRAVENOUS | Status: DC

## 2024-06-24 MED ORDER — FENTANYL CITRATE (PF) 100 MCG/2ML IJ SOLN
INTRAMUSCULAR | Status: AC
  Filled 2024-06-24: qty 2

## 2024-06-24 MED ORDER — LACTATED RINGERS IV SOLN
INTRAVENOUS | Status: AC | PRN
  Administered 2024-06-24: 1000 mL via INTRAVENOUS

## 2024-06-24 MED ORDER — PROPOFOL 1000 MG/100ML IV EMUL
INTRAVENOUS | Status: AC
  Filled 2024-06-24: qty 100

## 2024-06-24 MED ORDER — LIDOCAINE 2% (20 MG/ML) 5 ML SYRINGE
INTRAMUSCULAR | Status: DC | PRN
  Administered 2024-06-24: 100 mg via INTRAVENOUS

## 2024-06-24 MED ORDER — FENTANYL CITRATE (PF) 100 MCG/2ML IJ SOLN
25.0000 ug | Freq: Once | INTRAMUSCULAR | Status: AC
  Administered 2024-06-24: 25 ug via INTRAVENOUS

## 2024-06-24 MED ORDER — LACTATED RINGERS IV SOLN: INTRAVENOUS | Status: DC | PRN

## 2024-06-24 MED ORDER — PHENYLEPHRINE 80 MCG/ML (10ML) SYRINGE FOR IV PUSH (FOR BLOOD PRESSURE SUPPORT)
PREFILLED_SYRINGE | INTRAVENOUS | Status: DC | PRN
  Administered 2024-06-24: 80 ug via INTRAVENOUS

## 2024-06-24 MED ORDER — PROPOFOL 10 MG/ML IV BOLUS
INTRAVENOUS | Status: DC | PRN
  Administered 2024-06-24: 50 mg via INTRAVENOUS
  Administered 2024-06-24: 85 ug/kg/min via INTRAVENOUS

## 2024-06-24 NOTE — Transfer of Care (Signed)
 Immediate Anesthesia Transfer of Care Note  Patient: Denise Morales  Procedure(s) Performed: ULTRASOUND, UPPER GI TRACT, ENDOSCOPIC EGD (ESOPHAGOGASTRODUODENOSCOPY) BIOPSY, SKIN, SUBCUTANEOUS TISSUE, OR MUCOUS MEMBRANE  Patient Location: PACU and Endoscopy Unit  Anesthesia Type:MAC  Level of Consciousness: awake, alert , and oriented  Airway & Oxygen Therapy: Patient Spontanous Breathing and Patient connected to face mask oxygen  Post-op Assessment: Report given to RN and Post -op Vital signs reviewed and unstable, Anesthesiologist notified  Post vital signs: Reviewed and stable  Last Vitals:  Vitals Value Taken Time  BP 92/55 06/24/24 14:41  Temp    Pulse 75 06/24/24 14:43  Resp 15 06/24/24 14:43  SpO2 100 % 06/24/24 14:43  Vitals shown include unfiled device data.  Last Pain:  Vitals:   06/24/24 1441  TempSrc:   PainSc: 0-No pain         Complications: No notable events documented.

## 2024-06-24 NOTE — H&P (Signed)
 "  GASTROENTEROLOGY PROCEDURE H&P NOTE   Primary Care Physician: Kayla Jeoffrey RAMAN, FNP  HPI: Denise Morales is a 63 y.o. female who presents for EGD/EUS to evaluate dilated bile duct.  Past Medical History:  Diagnosis Date   Allergy    Anxiety    Arthritis    Bilateral bunions    Bunion of left foot 07/2023   Depression    Diverticulitis    Essential hypertension    Fibromyalgia    GERD (gastroesophageal reflux disease)    History of hiatal hernia    Hyperlipidemia    Hypothyroidism    Melanoma (HCC)    face   Pre-diabetes    Past Surgical History:  Procedure Laterality Date   ABDOMINAL HYSTERECTOMY  1985   AIKEN OSTEOTOMY Right 06/05/2022   Procedure: KATRINA OSTEOTOMY;  Surgeon: Tobie Franky SQUIBB, DPM;  Location: [facility];  Service: Podiatry;  Laterality: Right;   BICEPT TENODESIS Right 12/05/2022   Procedure: RIGHT BICEPS TENODESIS;  Surgeon: Addie Cordella Hamilton, MD;  Location: [facility] OR;  Service: Orthopedics;  Laterality: Right;   BUNIONECTOMY Bilateral 04/07/2024   Procedure: BUNIONECTOMY;  Surgeon: Tobie Franky SQUIBB, DPM;  Location: [facility];  Service: Orthopedics/Podiatry;  Laterality: Bilateral;   CAPSULOTOMY METATARSOPHALANGEAL Right 06/05/2022   Procedure: CAPSULOTOMY METATARSOPHALANGEAL;  Surgeon: Tobie Franky SQUIBB, DPM;  Location: [facility];  Service: Podiatry;  Laterality: Right;   CHOLECYSTECTOMY     COLONOSCOPY     COLONOSCOPY WITH ESOPHAGOGASTRODUODENOSCOPY (EGD)  12/2022   HALLUX VALGUS LAPIDUS Right 06/05/2022   Procedure: HALLUX VALGUS LAPIDUS;  Surgeon: Tobie Franky SQUIBB, DPM;  Location: [facility];  Service: Podiatry;  Laterality: Right;   HALLUX VALGUS LAPIDUS Left 09/03/2023   Procedure: ROMAYNE LOOK;  Surgeon: Tobie Franky SQUIBB, DPM;  Location: [facility];  Service: Orthopedics/Podiatry;  Laterality: Left;  POPLITEAL BLOCK   HAMMER TOE SURGERY Right 06/05/2022   Procedure: HAMMER TOE CORRECTION;  Surgeon:  Tobie Franky SQUIBB, DPM;  Location: [facility];  Service: Podiatry;  Laterality: Right;   HERNIA REPAIR  2025   JOINT REPLACEMENT  2022 - 2024   REVERSE SHOULDER ARTHROPLASTY Right 12/05/2022   Procedure: RIGHT REVERSE SHOULDER ARTHROPLASTY;  Surgeon: Addie Cordella Hamilton, MD;  Location: [facility] OR;  Service: Orthopedics;  Laterality: Right;   REVERSE SHOULDER ARTHROPLASTY Left 04/16/2023   Procedure: REVERSE SHOULDER ARTHROPLASTY;  Surgeon: Addie Cordella Hamilton, MD;  Location: [facility] OR;  Service: Orthopedics;  Laterality: Left;   SHOULDER ARTHROSCOPY WITH ROTATOR CUFF REPAIR AND SUBACROMIAL DECOMPRESSION Right 08/12/2020   Procedure: RIGHT SHOULDER ARTHROSCOPY WITH ROTATOR CUFF REPAIR AND SUBACROMIAL DECOMPRESSION;  Surgeon: Vernetta Lonni GRADE, MD;  Location: [facility];  Service: Orthopedics;  Laterality: Right;   STERIOD INJECTION Left 09/03/2023   Procedure: STEROID INJECTION;  Surgeon: Tobie Franky SQUIBB, DPM;  Location: [facility];  Service: Orthopedics/Podiatry;  Laterality: Left;   TOTAL KNEE ARTHROPLASTY Right 12/24/2020   Procedure: RIGHT TOTAL KNEE ARTHROPLASTY;  Surgeon: Vernetta Lonni GRADE, MD;  Location: [facility];  Service: Orthopedics;  Laterality: Right;   TOTAL KNEE ARTHROPLASTY Left 07/15/2021   Procedure: LEFT TOTAL KNEE ARTHROPLASTY;  Surgeon: Vernetta Lonni GRADE, MD;  Location: [facility];  Service: Orthopedics;  Laterality: Left;   TUBAL LIGATION  1987   XI ROBOTIC ASSISTED PARAESOPHAGEAL HERNIA REPAIR N/A 03/12/2023   Procedure: XI ROBOTIC ASSISTED PARAESOPHAGEAL HERNIA REPAIR WITH FUNDOPLICATION;  Surgeon: Signe Mitzie LABOR, MD;  Location: [facility];  Service: General;  Laterality: N/A;  180   No current facility-administered medications for this encounter.   Current Medications[1] Allergies[2] Family History  Problem Relation Age of Onset   Dementia Mother    Heart Problems Mother    Stroke Mother    Hypertension Mother    Anxiety disorder Mother     Arthritis Mother    Depression Mother    Hypertension Father    Colon polyps Father    Diabetes Father    Cancer Father    Heart disease Father    Kidney disease Father    Hypertension Sister    Diabetes Sister        borderline   Fibromyalgia Sister    Anxiety disorder Sister    Arthritis Sister    COPD Sister    Depression Sister    Heart disease Sister    Kidney disease Sister    Hypertension Sister    Colon polyps Sister    Diabetes Sister    Fibromyalgia Sister    Heart Problems Sister    Arthritis Sister    Anxiety disorder Sister    COPD Sister    Depression Sister    Heart disease Sister    Hypertension Sister    Heart Problems Sister    Anxiety disorder Sister    ADD / ADHD Daughter    Diabetes Paternal Aunt    Colon cancer Neg Hx    Esophageal cancer Neg Hx    Rectal cancer Neg Hx    Stomach cancer Neg Hx    Breast cancer Neg Hx    Social History   Socioeconomic History   Marital status: Married    Spouse name: Darina   Number of children: 1   Years of education: Not on file   Highest education level: 12th grade  Occupational History   Not on file  Tobacco Use   Smoking status: Never    Passive exposure: Past   Smokeless tobacco: Never  Vaping Use   Vaping status: Never Used  Substance and Sexual Activity   Alcohol use: Never   Drug use: Never   Sexual activity: Not Currently    Birth control/protection: Surgical    Comment: Hysterectomy  Other Topics Concern   Not on file  Social History Narrative   Not on file   Social Drivers of Health   Tobacco Use: Low Risk (06/24/2024)   Patient History    Smoking Tobacco Use: Never    Smokeless Tobacco Use: Never    Passive Exposure: Past  Financial Resource Strain: Low Risk (12/06/2023)   Overall Financial Resource Strain (CARDIA)    Difficulty of Paying Living Expenses: Not hard at all  Food Insecurity: No Food Insecurity (05/21/2024)   Epic    Worried About Radiation Protection Practitioner of Food in the  Last Year: Never true    Ran Out of Food in the Last Year: Never true  Transportation Needs: No Transportation Needs (05/21/2024)   Epic    Lack of Transportation (Medical): No    Lack of Transportation (Non-Medical): No  Physical Activity: Insufficiently Active (05/21/2024)   Exercise Vital Sign    Days of Exercise per Week: 2 days    Minutes of Exercise per Session: 60 min  Stress: Stress Concern Present (05/21/2024)   [employer] of Occupational Health - Occupational Stress Questionnaire    Feeling of Stress: Rather much  Social Connections: Moderately Isolated (05/21/2024)   Social Connection and Isolation Panel    Frequency of Communication with Friends and  Family: Not on file    Frequency of Social Gatherings with Friends and Family: More than three times a week    Attends Religious Services: Never    Database Administrator or Organizations: No    Attends Banker Meetings: Never    Marital Status: Married  Catering Manager Violence: Not At Risk (05/21/2024)   Epic    Fear of Current or Ex-Partner: No    Emotionally Abused: No    Physically Abused: No    Sexually Abused: No  Depression (PHQ2-9): Low Risk (05/21/2024)   Depression (PHQ2-9)    PHQ-2 Score: 3  Alcohol Screen: Medium Risk (03/31/2023)   Alcohol Screen    Last Alcohol Screening Score (AUDIT): 8  Housing: Low Risk (05/21/2024)   Epic    Unable to Pay for Housing in the Last Year: No    Number of Times Moved in the Last Year: 0    Homeless in the Last Year: No  Utilities: Not At Risk (05/21/2024)   Epic    Threatened with loss of utilities: No  Health Literacy: Adequate Health Literacy (05/21/2024)   B1300 Health Literacy    Frequency of need for help with medical instructions: Never    Physical Exam: There were no vitals filed for this visit. There is no height or weight on file to calculate BMI. GEN: NAD EYE: Sclerae anicteric ENT: MMM CV: Non-tachycardic GI: Soft, NT/ND NEURO:   Alert & Oriented x 3  Lab Results: No results for input(s): WBC, HGB, HCT, PLT in the last 72 hours. BMET No results for input(s): NA, K, CL, CO2, GLUCOSE, BUN, CREATININE, CALCIUM in the last 72 hours. LFT No results for input(s): PROT, ALBUMIN , AST, ALT, ALKPHOS, BILITOT, BILIDIR, IBILI in the last 72 hours. PT/INR No results for input(s): LABPROT, INR in the last 72 hours.   Impression / Plan: This is a 63 y.o.female who presents for EGD/EUS to evaluate dilated bile duct.  The risks of an EUS including intestinal perforation, bleeding, infection, aspiration, and medication effects were discussed as was the possibility it may not give a definitive diagnosis if a biopsy is performed.  When a biopsy of the pancreas is done as part of the EUS, there is an additional risk of pancreatitis at the rate of about 1-2%.  It was explained that procedure related pancreatitis is typically mild, although it can be severe and even life threatening, which is why we do not perform random pancreatic biopsies and only biopsy a lesion/area we feel is concerning enough to warrant the risk.   The risks and benefits of endoscopic evaluation/treatment were discussed with the patient and/or family; these include but are not limited to the risk of perforation, infection, bleeding, missed lesions, lack of diagnosis, severe illness requiring hospitalization, as well as anesthesia and sedation related illnesses.  The patient's history has been reviewed, patient examined, no change in status, and deemed stable for procedure.  The patient and/or family was provided an opportunity to ask questions and all were answered.  The patient and/or family is agreeable to proceed.    Aloha Finner, MD [facility] Office # [phone]     [1] No current facility-administered medications for this encounter. [2]  Allergies Allergen Reactions    Hydroxyzine  Other (See Comments)    hallucinations   "

## 2024-06-24 NOTE — Anesthesia Postprocedure Evaluation (Signed)
"   Anesthesia Post Note  Patient: Denise Morales  Procedure(s) Performed: ULTRASOUND, UPPER GI TRACT, ENDOSCOPIC EGD (ESOPHAGOGASTRODUODENOSCOPY) BIOPSY, SKIN, SUBCUTANEOUS TISSUE, OR MUCOUS MEMBRANE     Patient location during evaluation: Endoscopy Anesthesia Type: MAC Level of consciousness: awake and alert Pain management: pain level controlled Vital Signs Assessment: post-procedure vital signs reviewed and stable Respiratory status: spontaneous breathing, nonlabored ventilation, respiratory function stable and patient connected to nasal cannula oxygen Cardiovascular status: stable and blood pressure returned to baseline Postop Assessment: no apparent nausea or vomiting Anesthetic complications: no   No notable events documented.  Last Vitals:  Vitals:   06/24/24 1450 06/24/24 1500  BP: (!) 114/57 (!) 127/49  Pulse: 76 76  Resp: 16 15  Temp:    SpO2: 97% 94%    Last Pain:  Vitals:   06/24/24 1510  TempSrc:   PainSc: 0-No pain                 Garnette LABOR Makena Mcgrady      "

## 2024-06-24 NOTE — Discharge Instructions (Signed)

## 2024-06-24 NOTE — Op Note (Signed)
 [facility] Patient Name: Denise Morales Procedure Date: 06/24/2024 MRN: [id number] Attending MD: Aloha Finner , MD, [id number] Date of Birth: 10-15-1960 CSN: [id number] Age: 63 Admit Type: Outpatient Procedure:                Upper EUS Indications:              Common bile duct dilation (acquired) seen on MRCP Providers:                Aloha Finner, MD, Jacquelyn Jaci Pierce,                            RN, Corene Southgate, Technician Referring MD:              Medicines:                Monitored Anesthesia Care Complications:            No immediate complications. Estimated Blood Loss:     Estimated blood loss was minimal. Procedure:                Pre-Anesthesia Assessment:                           - Prior to the procedure, a History and Physical                            was performed, and patient medications and                            allergies were reviewed. The patient's tolerance of                            previous anesthesia was also reviewed. The risks                            and benefits of the procedure and the sedation                            options and risks were discussed with the patient.                            All questions were answered, and informed consent                            was obtained. Prior Anticoagulants: The patient has                            taken no anticoagulant or antiplatelet agents. ASA                            Grade Assessment: III - A patient with severe                            systemic disease. After reviewing the risks and  benefits, the patient was deemed in satisfactory                            condition to undergo the procedure.                           After obtaining informed consent, the endoscope was                            passed under direct vision. Throughout the                            procedure, the patient's blood pressure, pulse, and                             oxygen saturations were monitored continuously. The                            [id number]-[id number] ([id number]) Olympus endoscope was introduced                            through the mouth, and advanced to the second part                            of duodenum. The [id number] ([id number]) Olympus                            duodenoscope was introduced through the mouth, and                            advanced to the area of papilla. The [id number]                            ([id number]) Olympus ultrasound scope was introduced                            through the mouth, and advanced to the duodenum for                            ultrasound examination from the esophagus, stomach                            and duodenum. The upper EUS was accomplished                            without difficulty. The patient tolerated the                            procedure. Scope In: Scope Out: Findings:      ENDOSCOPIC FINDING: :      No gross lesions were noted in the proximal esophagus and in the mid       esophagus.      One tongue of salmon-colored mucosa was present from 29 to 31 cm. No  other visible abnormalities were present. Biopsies were taken with a       cold forceps for histology.      The Z-line was irregular and was found 31 cm from the incisors.      Patchy mildly erythematous mucosa without bleeding was found in the       entire examined stomach. Biopsies were taken with a cold forceps for       histology and Helicobacter pylori testing.      No gross lesions were noted in the duodenal bulb, in the first portion       of the duodenum and in the second portion of the duodenum.      A medium diverticulum was found in the area of the papilla.      The major papilla was normal and found entirely within the diverticulum.      ENDOSONOGRAPHIC FINDING: :      There was no sign of significant endosonographic abnormality in the       pancreatic head (PD - 3.3 mm), genu of the pancreas  (PD - 2.5 mm),       pancreatic body (PD - 1.3 mm) and pancreatic tail (PD - 0.9 mm. No       masses, no cysts, no calcifications, the pancreatic duct was well       visualized from ampulla to tail.      There was dilation in the common bile duct (3.3 ->7.9 -> 10 -> 14.5 mm)       and in the common hepatic duct (16.8 mm). No evidence of       choledocholithiasis with smooth tapering to the ampulla.      Endosonographic imaging of the ampulla showed no intramural       (subepithelial) lesion.      Endosonographic imaging in the visualized portion of the liver showed no       mass.      No malignant-appearing lymph nodes were visualized in the celiac region       (level 20), peripancreatic region and porta hepatis region.      Endosonographic imaging in the gastroesophageal junction showed no wall       thickening.      The celiac region was visualized.      The esophagus, stomach and duodenum were examined endosonographically. Impression:               EGD Impression:                           - No gross lesions in the proximal esophagus and in                            the mid esophagus.                           - Salmon-colored mucosa suspicious for                            short-segment Barrett's esophagus found distally.                            Biopsied.                           -  Z-line irregular, 31 cm from the incisors.                           - Erythematous mucosa in the stomach. Biopsied.                           - No gross lesions in the duodenal bulb, in the                            first portion of the duodenum and in the second                            portion of the duodenum.                           - Duodenal diverticulum at the region of the major                            papilla. Normal major papilla though found entirely                            within the diverticulum.                           EUS Impression:                           - There  was no sign of significant pathology in the                            pancreatic head, genu of the pancreas, pancreatic                            body and pancreatic tail. Normal PD throughout.                           - There was dilation in the common bile duct and in                            the common hepatic duct. No CDL noted with smooth                            tapering.                           - No intramural ampullary process noted.                           - No malignant-appearing lymph nodes were                            visualized in the celiac region (level 20),  peripancreatic region and porta hepatis region. Moderate Sedation:      Not Applicable - Patient had care per Anesthesia. Recommendation:           - The patient will be observed post-procedure,                            until all discharge criteria are met.                           - Discharge patient to home.                           - Patient has a contact number available for                            emergencies. The signs and symptoms of potential                            delayed complications were discussed with the                            patient. Return to normal activities tomorrow.                            Written discharge instructions were provided to the                            patient.                           - Resume previous diet.                           - Observe patient's clinical course.                           - Await path results.                           - Followup repeat MRI/MRCP in 1-2 years could be                            considered by primary GI if desired, but not                            clearly needed with today's evaluation and previous                            imaging showing ductal dilation (not noted however                            on the impression but on the imaging itself when                             reviewed).                           -  The findings and recommendations were discussed                            with the patient.                           - The findings and recommendations were discussed                            with the patient's family. Procedure Code(s):        --- Professional ---                           [phone], Esophagogastroduodenoscopy, flexible,                            transoral; with endoscopic ultrasound examination,                            including the esophagus, stomach, and either the                            duodenum or a surgically altered stomach where the                            jejunum is examined distal to the anastomosis                           43239, Esophagogastroduodenoscopy, flexible,                            transoral; with biopsy, single or multiple Diagnosis Code(s):        --- Professional ---                           K22.89, Other specified disease of esophagus                           K31.89, Other diseases of stomach and duodenum                           K83.8, Other specified diseases of biliary tract                           I89.9, Noninfective disorder of lymphatic vessels                            and lymph nodes, unspecified                           K57.10, Diverticulosis of small intestine without                            perforation or abscess without bleeding CPT copyright 2022 American Medical Association. All rights reserved. The codes documented in this report are preliminary and upon coder review may  be revised  to meet current compliance requirements. Aloha Finner, MD 06/24/2024 2:48:01 PM Number of Addenda: 0

## 2024-06-24 NOTE — Anesthesia Procedure Notes (Signed)
 Procedure Name: MAC Date/Time: 06/24/2024 2:02 PM  Performed by: Obadiah Reyes BROCKS, CRNAPre-anesthesia Checklist: Patient identified, Suction available, Emergency Drugs available, Patient being monitored and Timeout performed Oxygen Delivery Method: Simple face mask Preoxygenation: Pre-oxygenation with 100% oxygen Induction Type: IV induction

## 2024-06-25 LAB — SURGICAL PATHOLOGY

## 2024-06-26 ENCOUNTER — Encounter (HOSPITAL_COMMUNITY): Payer: Self-pay | Admitting: Gastroenterology

## 2024-06-27 ENCOUNTER — Ambulatory Visit: Payer: Self-pay | Admitting: Gastroenterology

## 2024-06-27 ENCOUNTER — Ambulatory Visit: Admitting: Podiatry

## 2024-07-09 ENCOUNTER — Ambulatory Visit: Admitting: Podiatry

## 2024-07-16 ENCOUNTER — Ambulatory Visit: Admitting: Podiatry

## 2024-07-25 ENCOUNTER — Ambulatory Visit: Admitting: Podiatry

## 2024-09-08 ENCOUNTER — Ambulatory Visit: Admitting: Family Medicine
